# Patient Record
Sex: Male | Born: 1940
Health system: Southern US, Community
[De-identification: ages and names within clinical notes are randomized; demographics above are authoritative.]

## PROBLEM LIST (undated history)

## (undated) DIAGNOSIS — I878 Other specified disorders of veins: Secondary | ICD-10-CM

## (undated) DIAGNOSIS — G4733 Obstructive sleep apnea (adult) (pediatric): Secondary | ICD-10-CM

## (undated) DIAGNOSIS — E785 Hyperlipidemia, unspecified: Secondary | ICD-10-CM

## (undated) DIAGNOSIS — J9610 Chronic respiratory failure, unspecified whether with hypoxia or hypercapnia: Secondary | ICD-10-CM

## (undated) DIAGNOSIS — I10 Essential (primary) hypertension: Secondary | ICD-10-CM

## (undated) DIAGNOSIS — E669 Obesity, unspecified: Secondary | ICD-10-CM

---

## 1990-05-06 DIAGNOSIS — I8000 Phlebitis and thrombophlebitis of superficial vessels of unspecified lower extremity: Secondary | ICD-10-CM | POA: Insufficient documentation

## 1997-08-27 ENCOUNTER — Emergency Department (HOSPITAL_COMMUNITY): Admission: EM | Admit: 1997-08-27 | Discharge: 1997-08-27 | Payer: Self-pay | Admitting: Emergency Medicine

## 1997-10-11 DIAGNOSIS — J45909 Unspecified asthma, uncomplicated: Secondary | ICD-10-CM | POA: Insufficient documentation

## 1998-10-11 ENCOUNTER — Encounter: Payer: Self-pay | Admitting: Emergency Medicine

## 1998-10-11 ENCOUNTER — Emergency Department (HOSPITAL_COMMUNITY): Admission: EM | Admit: 1998-10-11 | Discharge: 1998-10-11 | Payer: Self-pay | Admitting: Emergency Medicine

## 1998-12-03 ENCOUNTER — Encounter: Payer: Self-pay | Admitting: Emergency Medicine

## 1998-12-04 ENCOUNTER — Inpatient Hospital Stay (HOSPITAL_COMMUNITY): Admission: EM | Admit: 1998-12-04 | Discharge: 1998-12-06 | Payer: Self-pay | Admitting: Emergency Medicine

## 1998-12-05 ENCOUNTER — Encounter: Payer: Self-pay | Admitting: Internal Medicine

## 1998-12-06 ENCOUNTER — Encounter: Payer: Self-pay | Admitting: Internal Medicine

## 1999-02-28 ENCOUNTER — Emergency Department (HOSPITAL_COMMUNITY): Admission: EM | Admit: 1999-02-28 | Discharge: 1999-02-28 | Payer: Self-pay | Admitting: Emergency Medicine

## 1999-08-31 ENCOUNTER — Encounter: Payer: Self-pay | Admitting: Family Medicine

## 1999-08-31 ENCOUNTER — Ambulatory Visit (HOSPITAL_COMMUNITY): Admission: RE | Admit: 1999-08-31 | Discharge: 1999-08-31 | Payer: Self-pay | Admitting: Family Medicine

## 1999-10-18 ENCOUNTER — Emergency Department (HOSPITAL_COMMUNITY): Admission: EM | Admit: 1999-10-18 | Discharge: 1999-10-18 | Payer: Self-pay | Admitting: *Deleted

## 2000-04-01 DIAGNOSIS — I10 Essential (primary) hypertension: Secondary | ICD-10-CM | POA: Insufficient documentation

## 2000-09-06 ENCOUNTER — Encounter: Payer: Self-pay | Admitting: Family Medicine

## 2000-09-06 ENCOUNTER — Ambulatory Visit (HOSPITAL_COMMUNITY): Admission: RE | Admit: 2000-09-06 | Discharge: 2000-09-06 | Payer: Self-pay | Admitting: Family Medicine

## 2001-10-04 ENCOUNTER — Emergency Department (HOSPITAL_COMMUNITY): Admission: EM | Admit: 2001-10-04 | Discharge: 2001-10-04 | Payer: Self-pay

## 2001-12-04 ENCOUNTER — Emergency Department (HOSPITAL_COMMUNITY): Admission: EM | Admit: 2001-12-04 | Discharge: 2001-12-05 | Payer: Self-pay | Admitting: Emergency Medicine

## 2001-12-29 ENCOUNTER — Emergency Department (HOSPITAL_COMMUNITY): Admission: EM | Admit: 2001-12-29 | Discharge: 2001-12-29 | Payer: Self-pay | Admitting: Emergency Medicine

## 2001-12-29 ENCOUNTER — Encounter: Payer: Self-pay | Admitting: Emergency Medicine

## 2002-04-16 ENCOUNTER — Emergency Department (HOSPITAL_COMMUNITY): Admission: EM | Admit: 2002-04-16 | Discharge: 2002-04-16 | Payer: Self-pay | Admitting: *Deleted

## 2002-07-18 ENCOUNTER — Emergency Department (HOSPITAL_COMMUNITY): Admission: EM | Admit: 2002-07-18 | Discharge: 2002-07-18 | Payer: Self-pay | Admitting: Emergency Medicine

## 2002-08-12 DIAGNOSIS — E785 Hyperlipidemia, unspecified: Secondary | ICD-10-CM | POA: Insufficient documentation

## 2002-08-12 DIAGNOSIS — J301 Allergic rhinitis due to pollen: Secondary | ICD-10-CM | POA: Insufficient documentation

## 2002-09-10 ENCOUNTER — Emergency Department (HOSPITAL_COMMUNITY): Admission: EM | Admit: 2002-09-10 | Discharge: 2002-09-10 | Payer: Self-pay | Admitting: Emergency Medicine

## 2003-01-04 ENCOUNTER — Encounter: Payer: Self-pay | Admitting: Emergency Medicine

## 2003-01-04 ENCOUNTER — Emergency Department (HOSPITAL_COMMUNITY): Admission: AD | Admit: 2003-01-04 | Discharge: 2003-01-04 | Payer: Self-pay | Admitting: Emergency Medicine

## 2003-04-09 ENCOUNTER — Emergency Department (HOSPITAL_COMMUNITY): Admission: AD | Admit: 2003-04-09 | Discharge: 2003-04-09 | Payer: Self-pay | Admitting: Family Medicine

## 2003-06-09 ENCOUNTER — Emergency Department (HOSPITAL_COMMUNITY): Admission: EM | Admit: 2003-06-09 | Discharge: 2003-06-10 | Payer: Self-pay | Admitting: Emergency Medicine

## 2003-06-28 ENCOUNTER — Emergency Department (HOSPITAL_COMMUNITY): Admission: AD | Admit: 2003-06-28 | Discharge: 2003-06-29 | Payer: Self-pay | Admitting: Emergency Medicine

## 2004-02-05 ENCOUNTER — Emergency Department (HOSPITAL_COMMUNITY): Admission: EM | Admit: 2004-02-05 | Discharge: 2004-02-05 | Payer: Self-pay | Admitting: Emergency Medicine

## 2004-03-23 ENCOUNTER — Ambulatory Visit: Payer: Self-pay | Admitting: Family Medicine

## 2004-03-26 ENCOUNTER — Ambulatory Visit (HOSPITAL_BASED_OUTPATIENT_CLINIC_OR_DEPARTMENT_OTHER): Admission: RE | Admit: 2004-03-26 | Discharge: 2004-03-26 | Payer: Self-pay | Admitting: Family Medicine

## 2004-04-18 ENCOUNTER — Ambulatory Visit: Payer: Self-pay | Admitting: Family Medicine

## 2004-05-11 ENCOUNTER — Emergency Department (HOSPITAL_COMMUNITY): Admission: EM | Admit: 2004-05-11 | Discharge: 2004-05-11 | Payer: Self-pay | Admitting: Emergency Medicine

## 2004-10-26 ENCOUNTER — Ambulatory Visit: Payer: Self-pay | Admitting: Family Medicine

## 2004-12-02 ENCOUNTER — Emergency Department (HOSPITAL_COMMUNITY): Admission: EM | Admit: 2004-12-02 | Discharge: 2004-12-02 | Payer: Self-pay | Admitting: Emergency Medicine

## 2004-12-05 ENCOUNTER — Emergency Department (HOSPITAL_COMMUNITY): Admission: EM | Admit: 2004-12-05 | Discharge: 2004-12-06 | Payer: Self-pay | Admitting: Emergency Medicine

## 2004-12-07 ENCOUNTER — Ambulatory Visit: Payer: Self-pay | Admitting: Family Medicine

## 2004-12-07 DIAGNOSIS — M545 Low back pain, unspecified: Secondary | ICD-10-CM | POA: Insufficient documentation

## 2005-01-08 ENCOUNTER — Ambulatory Visit: Payer: Self-pay | Admitting: Family Medicine

## 2005-01-09 ENCOUNTER — Emergency Department (HOSPITAL_COMMUNITY): Admission: EM | Admit: 2005-01-09 | Discharge: 2005-01-10 | Payer: Self-pay | Admitting: Emergency Medicine

## 2005-02-23 ENCOUNTER — Ambulatory Visit: Payer: Self-pay | Admitting: Pulmonary Disease

## 2005-10-30 ENCOUNTER — Ambulatory Visit: Payer: Self-pay | Admitting: Family Medicine

## 2006-02-05 ENCOUNTER — Ambulatory Visit: Payer: Self-pay | Admitting: Family Medicine

## 2006-02-12 ENCOUNTER — Emergency Department (HOSPITAL_COMMUNITY): Admission: EM | Admit: 2006-02-12 | Discharge: 2006-02-12 | Payer: Self-pay | Admitting: *Deleted

## 2006-04-09 ENCOUNTER — Ambulatory Visit: Payer: Self-pay | Admitting: Family Medicine

## 2007-02-17 ENCOUNTER — Encounter (INDEPENDENT_AMBULATORY_CARE_PROVIDER_SITE_OTHER): Payer: Self-pay | Admitting: *Deleted

## 2007-03-24 ENCOUNTER — Encounter (INDEPENDENT_AMBULATORY_CARE_PROVIDER_SITE_OTHER): Payer: Self-pay | Admitting: Family Medicine

## 2007-03-31 ENCOUNTER — Ambulatory Visit: Payer: Self-pay | Admitting: Family Medicine

## 2007-03-31 LAB — CONVERTED CEMR LAB
ALT: 44 units/L (ref 0–53)
AST: 26 units/L (ref 0–37)
Albumin: 4.2 g/dL (ref 3.5–5.2)
Alkaline Phosphatase: 120 units/L — ABNORMAL HIGH (ref 39–117)
BUN: 13 mg/dL (ref 6–23)
Basophils Absolute: 0.1 10*3/uL (ref 0.0–0.1)
Basophils Relative: 1 % (ref 0–1)
CO2: 25 meq/L (ref 19–32)
Calcium: 8.9 mg/dL (ref 8.4–10.5)
Chloride: 103 meq/L (ref 96–112)
Cholesterol: 171 mg/dL (ref 0–200)
Creatinine, Ser: 1.03 mg/dL (ref 0.40–1.50)
Eosinophils Absolute: 0.1 10*3/uL — ABNORMAL LOW (ref 0.2–0.7)
Eosinophils Relative: 1 % (ref 0–5)
Glucose, Bld: 114 mg/dL — ABNORMAL HIGH (ref 70–99)
HCT: 43.1 % (ref 39.0–52.0)
HDL: 52 mg/dL (ref >39)
Hemoglobin: 14.1 g/dL (ref 13.0–17.0)
LDL Cholesterol: 93 mg/dL (ref 0–99)
Lymphocytes Relative: 20 % (ref 12–46)
Lymphs Abs: 1.4 10*3/uL (ref 0.7–4.0)
MCHC: 32.7 g/dL (ref 30.0–36.0)
MCV: 84.5 fL (ref 78.0–100.0)
Monocytes Absolute: 0.6 10*3/uL (ref 0.1–1.0)
Monocytes Relative: 9 % (ref 3–12)
Neutro Abs: 4.9 10*3/uL (ref 1.7–7.7)
Neutrophils Relative %: 69 % (ref 43–77)
Platelets: 369 10*3/uL (ref 150–400)
Potassium: 4.5 meq/L (ref 3.5–5.3)
RBC: 5.1 M/uL (ref 4.22–5.81)
RDW: 13.8 % (ref 11.5–15.5)
Sodium: 140 meq/L (ref 135–145)
TSH: 3.273 microintl units/mL (ref 0.350–5.50)
Total Bilirubin: 0.6 mg/dL (ref 0.3–1.2)
Total CHOL/HDL Ratio: 3.3
Total Protein: 7.4 g/dL (ref 6.0–8.3)
Triglycerides: 128 mg/dL (ref <150)
VLDL: 26 mg/dL (ref 0–40)
WBC: 7.1 10*3/uL (ref 4.0–10.5)

## 2007-04-04 ENCOUNTER — Telehealth (INDEPENDENT_AMBULATORY_CARE_PROVIDER_SITE_OTHER): Payer: Self-pay | Admitting: Family Medicine

## 2007-04-04 DIAGNOSIS — G4733 Obstructive sleep apnea (adult) (pediatric): Secondary | ICD-10-CM | POA: Insufficient documentation

## 2007-04-23 ENCOUNTER — Telehealth (INDEPENDENT_AMBULATORY_CARE_PROVIDER_SITE_OTHER): Payer: Self-pay | Admitting: Family Medicine

## 2007-05-07 ENCOUNTER — Encounter (INDEPENDENT_AMBULATORY_CARE_PROVIDER_SITE_OTHER): Payer: Self-pay | Admitting: Family Medicine

## 2007-10-06 ENCOUNTER — Telehealth (INDEPENDENT_AMBULATORY_CARE_PROVIDER_SITE_OTHER): Payer: Self-pay | Admitting: Family Medicine

## 2007-10-26 ENCOUNTER — Emergency Department (HOSPITAL_COMMUNITY): Admission: EM | Admit: 2007-10-26 | Discharge: 2007-10-26 | Payer: Self-pay | Admitting: Emergency Medicine

## 2007-12-01 ENCOUNTER — Ambulatory Visit: Payer: Self-pay | Admitting: Family Medicine

## 2007-12-01 DIAGNOSIS — K439 Ventral hernia without obstruction or gangrene: Secondary | ICD-10-CM | POA: Insufficient documentation

## 2007-12-03 ENCOUNTER — Encounter (INDEPENDENT_AMBULATORY_CARE_PROVIDER_SITE_OTHER): Payer: Self-pay | Admitting: Family Medicine

## 2007-12-08 ENCOUNTER — Encounter (INDEPENDENT_AMBULATORY_CARE_PROVIDER_SITE_OTHER): Payer: Self-pay | Admitting: Family Medicine

## 2007-12-10 LAB — CONVERTED CEMR LAB
ALT: 38 units/L (ref 0–53)
AST: 25 units/L (ref 0–37)
Albumin: 4.1 g/dL (ref 3.5–5.2)
Alkaline Phosphatase: 105 units/L (ref 39–117)
BUN: 11 mg/dL (ref 6–23)
Basophils Absolute: 0.1 10*3/uL (ref 0.0–0.1)
Basophils Relative: 1 % (ref 0–1)
CO2: 25 meq/L (ref 19–32)
Calcium: 8.6 mg/dL (ref 8.4–10.5)
Chloride: 101 meq/L (ref 96–112)
Cholesterol: 158 mg/dL (ref 0–200)
Creatinine, Ser: 1.03 mg/dL (ref 0.40–1.50)
Eosinophils Absolute: 0.1 10*3/uL (ref 0.0–0.7)
Eosinophils Relative: 2 % (ref 0–5)
Glucose, Bld: 127 mg/dL — ABNORMAL HIGH (ref 70–99)
HCT: 42.2 % (ref 39.0–52.0)
HDL: 50 mg/dL (ref >39)
Hemoglobin: 14 g/dL (ref 13.0–17.0)
LDL Cholesterol: 85 mg/dL (ref 0–99)
Lymphocytes Relative: 19 % (ref 12–46)
Lymphs Abs: 1.4 10*3/uL (ref 0.7–4.0)
MCHC: 33.2 g/dL (ref 30.0–36.0)
MCV: 85.4 fL (ref 78.0–100.0)
Monocytes Absolute: 0.6 10*3/uL (ref 0.1–1.0)
Monocytes Relative: 8 % (ref 3–12)
Neutro Abs: 5 10*3/uL (ref 1.7–7.7)
Neutrophils Relative %: 69 % (ref 43–77)
Platelets: 329 10*3/uL (ref 150–400)
Potassium: 4.3 meq/L (ref 3.5–5.3)
RBC: 4.94 M/uL (ref 4.22–5.81)
RDW: 14.4 % (ref 11.5–15.5)
Sodium: 138 meq/L (ref 135–145)
TSH: 2.018 microintl units/mL (ref 0.350–4.50)
Total Bilirubin: 0.6 mg/dL (ref 0.3–1.2)
Total CHOL/HDL Ratio: 3.2
Total Protein: 7.1 g/dL (ref 6.0–8.3)
Triglycerides: 114 mg/dL (ref <150)
VLDL: 23 mg/dL (ref 0–40)
WBC: 7.2 10*3/uL (ref 4.0–10.5)

## 2007-12-26 ENCOUNTER — Encounter (INDEPENDENT_AMBULATORY_CARE_PROVIDER_SITE_OTHER): Payer: Self-pay | Admitting: Family Medicine

## 2007-12-26 ENCOUNTER — Telehealth (INDEPENDENT_AMBULATORY_CARE_PROVIDER_SITE_OTHER): Payer: Self-pay | Admitting: *Deleted

## 2007-12-30 ENCOUNTER — Encounter (INDEPENDENT_AMBULATORY_CARE_PROVIDER_SITE_OTHER): Payer: Self-pay | Admitting: *Deleted

## 2008-01-08 ENCOUNTER — Encounter (INDEPENDENT_AMBULATORY_CARE_PROVIDER_SITE_OTHER): Payer: Self-pay | Admitting: Family Medicine

## 2008-06-05 ENCOUNTER — Inpatient Hospital Stay (HOSPITAL_COMMUNITY): Admission: EM | Admit: 2008-06-05 | Discharge: 2008-06-06 | Payer: Self-pay | Admitting: Emergency Medicine

## 2008-06-05 ENCOUNTER — Ambulatory Visit: Payer: Self-pay | Admitting: Cardiology

## 2008-07-08 ENCOUNTER — Ambulatory Visit: Payer: Self-pay | Admitting: Family Medicine

## 2008-09-07 ENCOUNTER — Ambulatory Visit: Payer: Self-pay | Admitting: Family Medicine

## 2008-12-02 ENCOUNTER — Ambulatory Visit: Payer: Self-pay | Admitting: Physician Assistant

## 2008-12-02 DIAGNOSIS — R7309 Other abnormal glucose: Secondary | ICD-10-CM | POA: Insufficient documentation

## 2008-12-03 ENCOUNTER — Encounter: Payer: Self-pay | Admitting: Physician Assistant

## 2008-12-03 LAB — CONVERTED CEMR LAB
ALT: 30 units/L (ref 0–53)
AST: 25 units/L (ref 0–37)
Albumin: 3.7 g/dL (ref 3.5–5.2)
Alkaline Phosphatase: 104 units/L (ref 39–117)
BUN: 15 mg/dL (ref 6–23)
Basophils Absolute: 0.1 10*3/uL (ref 0.0–0.1)
Basophils Relative: 1 % (ref 0–1)
CO2: 22 meq/L (ref 19–32)
Calcium: 8.8 mg/dL (ref 8.4–10.5)
Chloride: 102 meq/L (ref 96–112)
Cholesterol: 148 mg/dL (ref 0–200)
Creatinine, Ser: 0.9 mg/dL (ref 0.40–1.50)
Eosinophils Absolute: 0.1 10*3/uL (ref 0.0–0.7)
Eosinophils Relative: 1 % (ref 0–5)
Glucose, Bld: 112 mg/dL — ABNORMAL HIGH (ref 70–99)
HCT: 40.1 % (ref 39.0–52.0)
HDL: 56 mg/dL (ref >39)
Hemoglobin: 13 g/dL (ref 13.0–17.0)
Hgb A1c MFr Bld: 6 % (ref 4.6–6.1)
LDL Cholesterol: 75 mg/dL (ref 0–99)
Lymphocytes Relative: 20 % (ref 12–46)
Lymphs Abs: 1.6 10*3/uL (ref 0.7–4.0)
MCHC: 32.4 g/dL (ref 30.0–36.0)
MCV: 86.2 fL (ref 78.0–100.0)
Monocytes Absolute: 0.6 10*3/uL (ref 0.1–1.0)
Monocytes Relative: 7 % (ref 3–12)
Neutro Abs: 5.8 10*3/uL (ref 1.7–7.7)
Neutrophils Relative %: 71 % (ref 43–77)
PSA: 0.44 ng/mL (ref 0.10–4.00)
Platelets: 306 10*3/uL (ref 150–400)
Potassium: 4.4 meq/L (ref 3.5–5.3)
RBC: 4.65 M/uL (ref 4.22–5.81)
RDW: 15.5 % (ref 11.5–15.5)
Sodium: 138 meq/L (ref 135–145)
TSH: 3.031 microintl units/mL (ref 0.350–4.500)
Total Bilirubin: 0.5 mg/dL (ref 0.3–1.2)
Total CHOL/HDL Ratio: 2.6
Total Protein: 6.6 g/dL (ref 6.0–8.3)
Triglycerides: 85 mg/dL (ref <150)
VLDL: 17 mg/dL (ref 0–40)
WBC: 8.1 10*3/uL (ref 4.0–10.5)

## 2008-12-16 ENCOUNTER — Encounter (INDEPENDENT_AMBULATORY_CARE_PROVIDER_SITE_OTHER): Payer: Self-pay | Admitting: Internal Medicine

## 2009-02-17 ENCOUNTER — Ambulatory Visit: Payer: Self-pay | Admitting: Physician Assistant

## 2009-02-17 LAB — CONVERTED CEMR LAB
Blood in Urine, dipstick: NEGATIVE
Glucose, Urine, Semiquant: NEGATIVE
Nitrite: NEGATIVE
Protein, U semiquant: NEGATIVE
Specific Gravity, Urine: <1.005
Urobilinogen, UA: 0.2
WBC Urine, dipstick: NEGATIVE
pH: 5

## 2009-02-18 ENCOUNTER — Telehealth: Payer: Self-pay | Admitting: Physician Assistant

## 2009-02-21 ENCOUNTER — Encounter: Payer: Self-pay | Admitting: Physician Assistant

## 2009-02-21 LAB — CONVERTED CEMR LAB
Amphetamine Screen, Ur: NEGATIVE
Barbiturate Quant, Ur: NEGATIVE
Benzodiazepines.: NEGATIVE
Cocaine Metabolites: NEGATIVE
Creatinine,U: 161.4 mg/dL
Marijuana Metabolite: NEGATIVE
Methadone: NEGATIVE
Microalb, Ur: 0.95 mg/dL (ref 0.00–1.89)
Opiate Screen, Urine: NEGATIVE
Phencyclidine (PCP): NEGATIVE
Propoxyphene: NEGATIVE

## 2009-02-28 ENCOUNTER — Encounter (INDEPENDENT_AMBULATORY_CARE_PROVIDER_SITE_OTHER): Payer: Self-pay | Admitting: *Deleted

## 2009-05-06 ENCOUNTER — Encounter: Payer: Self-pay | Admitting: Physician Assistant

## 2009-05-16 ENCOUNTER — Telehealth: Payer: Self-pay | Admitting: Physician Assistant

## 2009-07-15 ENCOUNTER — Telehealth: Payer: Self-pay | Admitting: Physician Assistant

## 2009-07-15 ENCOUNTER — Ambulatory Visit: Payer: Self-pay | Admitting: Physician Assistant

## 2009-07-28 ENCOUNTER — Encounter (INDEPENDENT_AMBULATORY_CARE_PROVIDER_SITE_OTHER): Payer: Self-pay | Admitting: *Deleted

## 2009-07-31 ENCOUNTER — Encounter: Payer: Self-pay | Admitting: Physician Assistant

## 2009-08-01 ENCOUNTER — Encounter: Payer: Self-pay | Admitting: Physician Assistant

## 2009-08-15 ENCOUNTER — Telehealth: Payer: Self-pay | Admitting: Physician Assistant

## 2009-10-04 ENCOUNTER — Telehealth: Payer: Self-pay | Admitting: Physician Assistant

## 2009-10-04 ENCOUNTER — Ambulatory Visit: Payer: Self-pay | Admitting: Physician Assistant

## 2009-10-05 ENCOUNTER — Encounter: Payer: Self-pay | Admitting: Physician Assistant

## 2009-10-05 LAB — CONVERTED CEMR LAB
Amphetamine Screen, Ur: NEGATIVE
Barbiturate Quant, Ur: NEGATIVE
Benzodiazepines.: NEGATIVE
Cocaine Metabolites: NEGATIVE
Creatinine,U: 179.9 mg/dL
Marijuana Metabolite: NEGATIVE
Methadone: NEGATIVE
Opiate Screen, Urine: NEGATIVE
Phencyclidine (PCP): NEGATIVE
Propoxyphene: NEGATIVE

## 2009-10-12 ENCOUNTER — Encounter (INDEPENDENT_AMBULATORY_CARE_PROVIDER_SITE_OTHER): Payer: Self-pay | Admitting: *Deleted

## 2009-10-18 ENCOUNTER — Encounter (INDEPENDENT_AMBULATORY_CARE_PROVIDER_SITE_OTHER): Payer: Self-pay | Admitting: *Deleted

## 2009-11-24 ENCOUNTER — Emergency Department (HOSPITAL_COMMUNITY): Admission: EM | Admit: 2009-11-24 | Discharge: 2009-11-24 | Payer: Self-pay | Admitting: Emergency Medicine

## 2010-01-04 ENCOUNTER — Ambulatory Visit: Payer: Self-pay | Admitting: Physician Assistant

## 2010-01-04 DIAGNOSIS — B360 Pityriasis versicolor: Secondary | ICD-10-CM | POA: Insufficient documentation

## 2010-01-04 DIAGNOSIS — J069 Acute upper respiratory infection, unspecified: Secondary | ICD-10-CM | POA: Insufficient documentation

## 2010-01-04 DIAGNOSIS — B353 Tinea pedis: Secondary | ICD-10-CM | POA: Insufficient documentation

## 2010-01-04 LAB — CONVERTED CEMR LAB
Bilirubin Urine: NEGATIVE
Blood in Urine, dipstick: NEGATIVE
Glucose, Urine, Semiquant: NEGATIVE
Ketones, urine, test strip: NEGATIVE
Nitrite: NEGATIVE
Protein, U semiquant: NEGATIVE
Specific Gravity, Urine: 1.015
Urobilinogen, UA: 0.2
WBC Urine, dipstick: NEGATIVE
pH: 5

## 2010-01-05 LAB — CONVERTED CEMR LAB
ALT: 43 units/L (ref 0–53)
AST: 36 units/L (ref 0–37)
Albumin: 3.8 g/dL (ref 3.5–5.2)
Alkaline Phosphatase: 94 units/L (ref 39–117)
Amphetamine Screen, Ur: NEGATIVE
BUN: 13 mg/dL (ref 6–23)
Barbiturate Quant, Ur: NEGATIVE
Basophils Absolute: 0 10*3/uL (ref 0.0–0.1)
Basophils Relative: 0 % (ref 0–1)
Benzodiazepines.: NEGATIVE
CO2: 24 meq/L (ref 19–32)
Calcium: 9 mg/dL (ref 8.4–10.5)
Chloride: 105 meq/L (ref 96–112)
Cocaine Metabolites: NEGATIVE
Creatinine, Ser: 0.9 mg/dL (ref 0.40–1.50)
Creatinine,U: 113.6 mg/dL
Eosinophils Absolute: 0.1 10*3/uL (ref 0.0–0.7)
Eosinophils Relative: 1 % (ref 0–5)
Glucose, Bld: 124 mg/dL — ABNORMAL HIGH (ref 70–99)
HCT: 40.2 % (ref 39.0–52.0)
Hemoglobin: 13.2 g/dL (ref 13.0–17.0)
Hgb A1c MFr Bld: 6.3 % — ABNORMAL HIGH (ref <5.7)
Lymphocytes Relative: 13 % (ref 12–46)
Lymphs Abs: 0.9 10*3/uL (ref 0.7–4.0)
MCHC: 32.8 g/dL (ref 30.0–36.0)
MCV: 86.3 fL (ref 78.0–100.0)
Marijuana Metabolite: NEGATIVE
Methadone: NEGATIVE
Monocytes Absolute: 0.5 10*3/uL (ref 0.1–1.0)
Monocytes Relative: 8 % (ref 3–12)
Neutro Abs: 5.4 10*3/uL (ref 1.7–7.7)
Neutrophils Relative %: 77 % (ref 43–77)
Opiate Screen, Urine: NEGATIVE
Phencyclidine (PCP): NEGATIVE
Platelets: 281 10*3/uL (ref 150–400)
Potassium: 4.5 meq/L (ref 3.5–5.3)
Propoxyphene: NEGATIVE
RBC: 4.66 M/uL (ref 4.22–5.81)
RDW: 14.8 % (ref 11.5–15.5)
Sodium: 140 meq/L (ref 135–145)
TSH: 2.306 microintl units/mL (ref 0.350–4.500)
Total Bilirubin: 0.6 mg/dL (ref 0.3–1.2)
Total Protein: 6.6 g/dL (ref 6.0–8.3)
WBC: 7 10*3/uL (ref 4.0–10.5)

## 2010-01-24 ENCOUNTER — Encounter: Payer: Self-pay | Admitting: Physician Assistant

## 2010-04-13 ENCOUNTER — Encounter (INDEPENDENT_AMBULATORY_CARE_PROVIDER_SITE_OTHER): Payer: Self-pay | Admitting: Nurse Practitioner

## 2010-04-13 ENCOUNTER — Ambulatory Visit: Payer: Self-pay | Admitting: Nurse Practitioner

## 2010-04-13 LAB — CONVERTED CEMR LAB
Blood Glucose, Fingerstick: 116
Cholesterol, target level: 200 mg/dL
Cholesterol: 176 mg/dL (ref 0–200)
HDL goal, serum: 40 mg/dL
HDL: 64 mg/dL (ref >39)
LDL Cholesterol: 95 mg/dL (ref 0–99)
LDL Goal: 130 mg/dL
Total CHOL/HDL Ratio: 2.8
Triglycerides: 85 mg/dL (ref <150)
VLDL: 17 mg/dL (ref 0–40)

## 2010-04-14 ENCOUNTER — Encounter (INDEPENDENT_AMBULATORY_CARE_PROVIDER_SITE_OTHER): Payer: Self-pay | Admitting: Nurse Practitioner

## 2010-05-17 ENCOUNTER — Encounter (INDEPENDENT_AMBULATORY_CARE_PROVIDER_SITE_OTHER): Payer: Self-pay | Admitting: Internal Medicine

## 2010-05-21 LAB — CONVERTED CEMR LAB
ALT: 38 units/L (ref 0–53)
AST: 27 units/L (ref 0–37)
Albumin: 4.2 g/dL (ref 3.5–5.2)
Alkaline Phosphatase: 108 units/L (ref 39–117)
BUN: 13 mg/dL (ref 6–23)
CO2: 27 meq/L (ref 19–32)
Calcium: 9.6 mg/dL (ref 8.4–10.5)
Chloride: 99 meq/L (ref 96–112)
Creatinine, Ser: 1.1 mg/dL (ref 0.40–1.50)
Glucose, Bld: 144 mg/dL — ABNORMAL HIGH (ref 70–99)
Hgb A1c MFr Bld: 6.4 % — ABNORMAL HIGH (ref 4.6–6.1)
Potassium: 4.7 meq/L (ref 3.5–5.3)
Sodium: 141 meq/L (ref 135–145)
Total Bilirubin: 0.7 mg/dL (ref 0.3–1.2)
Total Protein: 7.5 g/dL (ref 6.0–8.3)

## 2010-05-25 NOTE — Medication Information (Signed)
Summary: MEDICARE PRESCRIPTION DRUG PROGRAM  MEDICARE PRESCRIPTION DRUG PROGRAM   Imported By: Roland Earl 07/11/2009 10:50:58  _____________________________________________________________________  External Attachment:    Type:   Image     Comment:   External Document

## 2010-05-25 NOTE — Progress Notes (Signed)
  Phone Note Outgoing Call   Call placed by: Milagros Evener MD,  April 04, 2007 12:26 PM Summary of Call: L/M on business #(home # dc'd). He may come in on MON am if prefers or just call.  #1 Did he get his oxycontin? I have not had a pre-auth request... #2 He needs a fasting CBG if his bloodwork from prior visit was not fasting...otherwise he will need a HgAic at f/u.Marland KitchenMarland Kitchenpossible early DM... #3 Was there another medicine he needed me to refill ? VR  Follow-up for Phone Call        Home # disconnected. Work # has no answer. Will just wait until pt f/u to check Hgaic. Follow-up by: Milagros Evener MD,  April 07, 2007 6:03 PM

## 2010-05-25 NOTE — Letter (Signed)
Summary: *[facility] Results Follow up  [place], [place] [address]   Phone: [phone]  Fax: [phone]      02/21/2009   Brian Silva [address], [place]  [address]   Dear  [name],                            ____S.Drinkard,FNP   ____D. Gore,FNP       ____B. McPherson,MD   ____V. Rankins,MD    ____E. Mulberry,MD    ____N. Hassell Done, FNP  ____D. Jobe Igo, MD    ____K. Tomma Lightning, MD    __x__S. Kathlen Mody, PA-C     This letter is to inform you that your recent test(s):  _______Pap Smear    ___x____Lab Test     _______X-ray    ___x____ is within acceptable limits  _______ requires a medication change  _______ requires a follow-up lab visit  _______ requires a follow-up visit with your provider   Comments:       _________________________________________________________ If you have any questions, please contact our office                     Sincerely,  Brian Dopp PA-C [facility]

## 2010-05-25 NOTE — Letter (Signed)
Summary: AUTHORIZATION/ DO NOT USE  AUTHORIZATION/ DO NOT USE   Imported By: Roland Earl 12/03/2007 16:36:20  _____________________________________________________________________  External Attachment:    Type:   Image     Comment:   External Document  Appended Document: AUTHORIZATION/ DO NOT USE MD letter to Mr Lotti.

## 2010-05-25 NOTE — Letter (Signed)
Summary: Letter from DR [facility] TO PATIENT  Letter from DR [facility] TO PATIENT   Imported By: Roland Earl 03/24/2007 14:20:37  _____________________________________________________________________  External Attachment:    Type:   Image     Comment:   External Document

## 2010-05-25 NOTE — Medication Information (Signed)
Summary: [facility]   Imported By: Roland Earl 01/07/2008 17:01:36  _____________________________________________________________________  External Attachment:    Type:   Image     Comment:   External Document

## 2010-05-25 NOTE — Progress Notes (Signed)
  Phone Note Other Incoming   Summary of Call: Please ask patient if there is any way he can take a baby Aspirin (81 mg ) a day. If so, go ahead and start.   This is for prevention of heart attacks. He had calcification of his heart arteries in the past; so if he can take it, it would be very beneficial.  Initial call taken by: Versie Starks,  February 18, 2009 8:39 AM  Follow-up for Phone Call        Left message on answering machine for pt to call back.Marland KitchenMarland KitchenMarland KitchenThailand Shannon  February 18, 2009 10:19 AM   Left message on answering machine for pt to call back...Marland KitchenMarland KitchenThailand Shannon  February 25, 2009 4:55 PM   Left message on answering machine for pt to call back...Marland KitchenMarland KitchenMarland Kitchen will mail letter......Marland KitchenThailand Shannon  February 28, 2009 10:53 AM

## 2010-05-25 NOTE — Medication Information (Signed)
Summary: RX Folder/[facility]  RX Folder/[facility]   Imported By: Roland Earl 01/08/2008 09:37:18  _____________________________________________________________________  External Attachment:    Type:   Image     Comment:   External Document

## 2010-05-25 NOTE — Assessment & Plan Note (Signed)
Summary: CPE//mm   Vital Signs:  Patient profile:   70 year old male Height:      70.25 inches Weight:      416 pounds BMI:     59.48 Temp:     98.9 degrees F oral Pulse rate:   96 / minute Pulse rhythm:   regular Resp:     18 per minute BP sitting:   158 / 80  (left arm) Cuff size:   large  Vitals Entered By: CMA Student Phineas Inches CC: CPP, patient has a cough with white mucas, Hypertension Management Is Patient Diabetic? No Pain Assessment Patient in pain? no       Does patient need assistance? Functional Status Self care Ambulation Normal   Primary Care [facility]:  Richardson Dopp PA-C  CC:  CPP, patient has a cough with white mucas, and Hypertension Management.  History of Present Illness: Here for CPE. Complaining of cough x 3 days.  Sputum white.  No hemoptysis.  No chest pain.  No dyspnea.  Does have nasal congestion.  No PND or orthopnea.  No fevers.  No chills.  NO vomiting or diarrhea.  Using proventil more during day.  Wheezing.  PHQ9=5.   Hypertension History:      taking over the counter cold med.        Positive major cardiovascular risk factors include male age 44 years old or older, hyperlipidemia, and hypertension.  Negative major cardiovascular risk factors include negative family history for ischemic heart disease and non-tobacco-user status.    Problems Prior to Update: 1)  Tinea Versicolor  (ICD-111.0) 2)  Tinea Pedis  (ICD-110.4) 3)  Uri  (ICD-465.9) 4)  Preventive Health Care  (ICD-V70.0) 5)  Hyperglycemia  (ICD-790.29) 6)  Abdominal Wall Hernia  (ICD-553.20) 7)  Obesity, Morbid  (ICD-278.01) 8)  Sleep Apnea, Obstructive  (ICD-327.23) 9)  Superficial Phlebitis  (ICD-451.0) 10)  Allergic Rhinitis, Seasonal  (ICD-477.0) 11)  Low Back Pain, Chronic  (ICD-724.2) 12)  Hyperlipidemia  (ICD-272.4) 13)  Asthma  (ICD-493.90) 14)  Hypertension  (ICD-401.9)  Allergies (verified): No Known Drug Allergies  Past History:  Past Medical  History: Last updated: 12/02/2008 Hypertension (04/01/2000) Asthma (10/11/1997) Hyperlipidemia (08/12/2002) Venous Statis Disease (03/28/2004) Sleep Apnea..2005...study very difficult for him as he is awake routinely at night,clausterphobic,with suspected social anxiety.He has not kept multiple f/u appts for CPAP titration.Hecontinues to decline treatment 08/2008. Chest Pain - Admit [facility] 05/2008 (r/o for MI; no further w/u pursued; saw cardiology; rec. further eval if pt. has recurrent symptoms; chest CT demonstarted coronary art. disease/calcification)  Past Surgical History: Last updated: 08/31/2006 Sleep Study (03/26/2004) Open MRI-Lumbar  (11/27/2004)  Family History: no prostate cancer  Social History: Reviewed history from 09/07/2008 and no changes required. Lives alone. drives no phone.  Review of Systems      See HPI General:  See HPI. CV:  Denies chest pain or discomfort and shortness of breath with exertion. Resp:  Denies coughing up blood. GI:  Denies bloody stools and dark tarry stools. GU:  Denies dysuria. Derm:  Complains of rash. Psych:  See HPI. Heme:  Denies bleeding.  Physical Exam  General:  alert, well-developed, and well-nourished.   Head:  normocephalic and atraumatic.   Eyes:  pupils equal, pupils round, and pupils reactive to light.   Ears:  R ear normal and L ear normal.   Nose:  no external deformity.   Mouth:  pharynx pink and moist and no exudates.   Neck:  supple,  no thyromegaly, and no cervical lymphadenopathy.   Lungs:  faint exp wheezes bilat bases no rales  Heart:  normal rate and regular rhythm.   Abdomen:  soft and non-tender.   Rectal:  normal sphincter tone and external hemorrhoid(s).   Genitalia:  testicles small . .  .likely related to excessive adipose tissue no discharge  Prostate:  no gland enlargement and no nodules.  difficult exam due to size Msk:  antalgic gait Extremities:  1+ left pedal edema and 1+ right pedal edema.    Neurologic:  alert & oriented X3 and cranial nerves II-XII intact.   Skin:  + tinea versicolor on left arm  Psych:  normally interactive and good eye contact.     Foot/Ankle Exam  Skin:    tinea bilat feet   Impression & Recommendations:  Problem # 1:  PREVENTIVE HEALTH CARE (ICD-V70.0)  discussed PSA and colonoscopy he declines both of these at this time get stool cards declines HIV test  Orders: T-Hemoccult Cards-Multiple [id number]) T-TSH [id number])  Problem # 2:  HYPERTENSION (ICD-401.9)  only taking furosemide and clonidine check lytes and renal fxn elevated BP may be related to recent illness  The following medications were removed from the medication list:    Avapro 150 Mg Tabs (Irbesartan) ..... One by mouth q am His updated medication list for this problem includes:    Lasix 80 Mg Tabs (Furosemide) ..... One by mouth two times a day    Clonidine Hcl 0.1 Mg Tabs (Clonidine hcl) ..... One tab by mouth two times a day  Orders: T-Comprehensive Metabolic Panel (A999333) T-CBC w/Diff [id number]) T-TSH [id number]) T-Urinalysis [id number])  Problem # 3:  HYPERGLYCEMIA (ICD-790.29)  never got GTT repeat A1C  Orders: T- Hemoglobin A1C [id number])  Problem # 4:  SLEEP APNEA, OBSTRUCTIVE (ICD-327.23) noncompliant  Problem # 5:  ALLERGIC RHINITIS, SEASONAL (ICD-477.0) change allegra to zyrtec  Problem # 6:  LOW BACK PAIN, CHRONIC (ICD-724.2) check UDS neg for oxycontin last time says he last took on Tuesday (yest)  His updated medication list for this problem includes:    Nabumetone 500 Mg Tabs (Nabumetone) ..... One by mouth q 12 hours as needed pain    Oxycontin 10 Mg Tb12 (Oxycodone hcl) .Marland Kitchen... Take 1 tablet by mouth every 12 hours for pain  Orders: T-Drug Screen-Urine, (single) [phone])  Problem # 7:  HYPERLIPIDEMIA (ICD-272.4)  check lft's and arrange FLP  His updated medication list for this problem includes:    Lipitor 20  Mg Tabs (Atorvastatin calcium) ..... One once daily  Orders: T-Comprehensive Metabolic Panel (A999333)  Problem # 8:  ASTHMA (ICD-493.90) worse with recent URI  His updated medication list for this problem includes:    Ventolin Hfa 108 (90 Base) Mcg/act Aers (Albuterol sulfate) .Marland Kitchen... 1-2 puffs every 4-6 hours as needed for shortness of breath    Albuterol Sulfate (2.5 Mg/37ml) 0.083% Nebu (Albuterol sulfate) ..... Use  one ampule per nebulizer every 6-8 hours as needed sob    Advair Diskus 250-50 Mcg/dose Misc (Fluticasone-salmeterol) .Marland Kitchen... 1 puff 2 times daily    Advair Diskus 500-50 Mcg/dose Aepb (Fluticasone-salmeterol) .Marland Kitchen... 1 inhalation two times a day for one month.  then, resume 250/50 micrograms dose.  Problem # 9:  URI (ICD-465.9) zyrtec increase advair dose for 1-2 weeks fluids, tylenol, rest f/u as needed   His updated medication list for this problem includes:    Nabumetone 500 Mg Tabs (Nabumetone) ..... One by mouth q 12 hours as needed pain  Zyrtec Allergy 10 Mg Tabs (Cetirizine hcl) .Marland Kitchen... Take 1 tablet by mouth once a day as needed for allergies  Problem # 10:  TINEA PEDIS (ICD-110.4)  lamisil cream   His updated medication list for this problem includes:    Lamisil At 1 % Crea (Terbinafine hcl) .Marland Kitchen... Apply to feet two times a day for 2-3 weeks.  continue 1 week after clears  Complete Medication List: 1)  Lasix 80 Mg Tabs (Furosemide) .... One by mouth two times a day 2)  Lipitor 20 Mg Tabs (Atorvastatin calcium) .... One once daily 3)  Ventolin Hfa 108 (90 Base) Mcg/act Aers (Albuterol sulfate) .Marland Kitchen.. 1-2 puffs every 4-6 hours as needed for shortness of breath 4)  Clonidine Hcl 0.1 Mg Tabs (Clonidine hcl) .... One tab by mouth two times a day 5)  Nabumetone 500 Mg Tabs (Nabumetone) .... One by mouth q 12 hours as needed pain 6)  Oxycontin 10 Mg Tb12 (Oxycodone hcl) .... Take 1 tablet by mouth every 12 hours for pain 7)  Albuterol Sulfate (2.5 Mg/33ml) 0.083%  Nebu (Albuterol sulfate) .... Use  one ampule per nebulizer every 6-8 hours as needed sob 8)  Nebulizer Misc (Nebulizers) .... One nebulizer with supplies for home use.diagnosis is asthma 9)  Advair Diskus 250-50 Mcg/dose Misc (Fluticasone-salmeterol) .Marland Kitchen.. 1 puff 2 times daily 10)  Zyrtec Allergy 10 Mg Tabs (Cetirizine hcl) .... Take 1 tablet by mouth once a day as needed for allergies 11)  Flonase 50 Mcg/act Susp (Fluticasone propionate) .... 2 sprays each nostril once daily (use for 3 weeks, then take one week off) use as needed for allergies 12)  Lamisil At 1 % Crea (Terbinafine hcl) .... Apply to feet two times a day for 2-3 weeks.  continue 1 week after clears 13)  Selenium Sulfide 2.5 % Lotn (Selenium sulfide) .... Apply neck to knees once daily for 7 days; then once a month for 3 mos.  wash off after 5-10 minutes (allow to dry) 14)  Advair Diskus 500-50 Mcg/dose Aepb (Fluticasone-salmeterol) .Marland Kitchen.. 1 inhalation two times a day for one month.  then, resume 250/50 micrograms dose.  Hypertension Assessment/Plan:      The patient's hypertensive risk group is category B: At least one risk factor (excluding diabetes) with no target organ damage.  His calculated 10 year risk of coronary heart disease is 11 %.  Today's blood pressure is 158/80.  His blood pressure goal is < 140/90.   Patient Instructions: 1)  Schedule flu shot in 4-6 weeks. 2)  Schedule fasting lipids. 3)  Use zyrtec once daily to help with drainage. 4)  Use your flonase. 5)  Use higher dose of Advair for one month.  I sent a prescription to your pharmacy.  Then, go back to your old dose. 6)  Use your nebulizer every 4-6 hours for 2 days.  Then, use as needed. 7)  Continue to use Ventolin as needed. 8)  Drink plenty of fluids.  Get rest.  Take tylenol for fever or chills. 9)  Follow up with Scott if cold symptoms worsen or do not get better. 10)  Schedule follow up in 2 months for blood pressure. Prescriptions: OXYCONTIN 10 MG   TB12 (OXYCODONE HCL) Take 1 tablet by mouth every 12 hours for pain  #60 x 0   Entered and Authorized by:   Richardson Dopp PA-C   Signed by:   Richardson Dopp PA-C on 01/04/2010   Method used:   Print then Give to Patient  [id number] ADVAIR DISKUS 500-50 MCG/DOSE AEPB (FLUTICASONE-SALMETEROL) 1 inhalation two times a day for one month.  Then, resume 250/50 micrograms dose.  #1 x 0   Entered and Authorized by:   Richardson Dopp PA-C   Signed by:   Richardson Dopp PA-C on 01/04/2010   Method used:   Electronically to        [facility]. [phone]* (retail)       [address]       [place], [place]  [address]       Ph: [phone]       Fax: [phone]   RxIDBR:[id number] SELENIUM SULFIDE 2.5 % LOTN (SELENIUM SULFIDE) apply neck to knees once daily for 7 days; then once a month for 3 mos.  Wash off after 5-10 minutes (allow to dry)  #1 bottle x 2   Entered and Authorized by:   Richardson Dopp PA-C   Signed by:   Richardson Dopp PA-C on 01/04/2010   Method used:   Electronically to        [facility]. [phone]* (retail)       [address]       [place], [place]  [address]       Ph: [phone]       Fax: [phone]   RxID:   [id number] LAMISIL AT 1 % CREA (TERBINAFINE HCL) Apply to feet two times a day for 2-3 weeks.  Continue 1 week after clears  #45 grams x 1   Entered and Authorized by:   Richardson Dopp PA-C   Signed by:   Richardson Dopp PA-C on 01/04/2010   Method used:   Electronically to        [facility]. [phone]* (retail)       [address].       [place], [place]  [address]       Ph: [phone]       Fax: [phone]   RxID:   [id number] ZYRTEC ALLERGY 10 MG TABS (CETIRIZINE HCL) Take 1 tablet by mouth once a day as needed for allergies  #30 x 5   Entered and Authorized by:   Richardson Dopp PA-C   Signed by:   Richardson Dopp PA-C on 01/04/2010    Method used:   Electronically to        [facility]. [phone]* (retail)       [address].       [place], [place]  [address]       Ph: [phone]       Fax: [phone]   [id number]   Laboratory Results   Urine Tests    Routine Urinalysis   Glucose: negative   (Normal Range: Negative) Bilirubin: negative   (Normal Range: Negative) Ketone: negative   (Normal Range: Negative) Spec. Gravity: 1.015   (Normal Range: 1.003-1.035) Blood: negative   (Normal Range: Negative) pH: 5.0   (Normal Range: 5.0-8.0) Protein: negative   (Normal Range: Negative) Urobilinogen: 0.2   (Normal Range: 0-1) Nitrite: negative   (Normal Range: Negative) Leukocyte Esterace: negative   (Normal Range: Negative)

## 2010-05-25 NOTE — Assessment & Plan Note (Signed)
Summary: PER NURSE/ RENEW MEDICATIONS//GK   Vital Signs:  Patient Profile:   70 Years Old Male Height:     70.25 inches Weight:      350 pounds BMI:     50.04 BSA:     2.66 Temp:     97.1 degrees F Pulse rate:   98 / minute Pulse rhythm:   regular Resp:     20 per minute BP sitting:   134 / 80  (left arm) Cuff size:   large  Pt. in pain?   yes    Location:   back    Intensity:   5    Type:       dull  Vitals Entered By: Isla Pence (March 31, 2007 3:34 PM) Weight > 350 lbs. Yes              Is Patient Diabetic? No  Does patient need assistance? Ambulation Normal     Chief Complaint:  back pain , flu shot, and Rx refill.  History of Present Illness: Here for routine f/u.Needs refills and reports having more back and leg pain. #1 Has h/o LBP and radiculopathy since 2006. Had open MRI(limited by patient's body habitus) IN 8/06.     MRI showed L3-L4 DDD and moderate L3 foraminal impingement. Pt was referred to Neurosurgeon      chose not to f/u with several scheduled appts.see 10/30/05 note.He has been using relafen and      darvocet for pain but reports that this is no longer helping with pain.     He denies bowel or bladder incontinence. No muscle weakness in lower extremities or falls. Mainly,     he notes pain with prolonged periods of standing or sitting. He occasionally notes nubness and      tingling in his bilateral anterior thighs. Resolves spontaneously. #2 Pt has know sleep apnea and has declined CPAP(failed to keep scheduled appt in past and does not      wish to be re-referred. MD has d/w him issues and medical problems if not treated for his sleep       apnea. #3 He reports that he is taking his meds and needs refills.  #4 At end of visit, Pt says he is worried about his memory as he will occasionally forget what he is      doing. He has started to keep a daily calendar.  Current Allergies: No known allergies   Past Medical History:  Reviewed history from 08/31/2006 and no changes required:       Hypertension (04/01/2000)       Asthma (10/11/1997)       Hyperlipidemia (08/12/2002)       Venous Statis Disease (03/28/2004)  Past Surgical History:    Reviewed history from 08/31/2006 and no changes required:       Sleep Study (03/26/2004)       Open MRI-Lumbar  (11/27/2004)    Risk Factors:  Tobacco use:  never Caffeine use:  3 drinks per day Alcohol use:  yes    Type:  beer Seatbelt use:  100 %  Family History Risk Factors:    Family History of MI in females < 91 years old:  no    Family History of MI in males < 34 years old:  no   Review of Systems      See HPI   Physical Exam  General:     Morbid obesity.marginal hygiene. Talks in nl speech and  no signs of respiratory distress. Lungs:     Normal respiratory effort, chest expands symmetrically. Lungs are clear to auscultation, no crackles or wheezes. Heart:     Normal rate and regular rhythm. S1 and S2 normal without gallop, murmur, click, rub or other extra sounds. Extremities:     No appreciable CCE. Neurologic:     alert & oriented X3 and cranial nerves II-XII intact grossly. Memory screen:  3 word recall is excellent with 3/3 on immediate recall,3/3 at 1 min, and 3/3 at 5 min.   He is able to draw a clock and mark it correctly for "ten after eleven".   His animal recall only just normal at 12 and the screening test that was the most difficult for him.      Impression & Recommendations:  Problem # 1:  LOW BACK PAIN, CHRONIC (ICD-724.2) Options d/w patient and include referral to neurosurgeon,referral to chronic pain Center for possible epidural injections and management, or medication adjustment with use of Chronic Pain Contract/frequent monitoring/ and agreement to f/u in Pain Clinic if pain not controlled with oxycontin. Pt opts to try oxycontin and close f/u at clinic. He is sent home with a chronic pain contract to review and sign. He is  aware that he must keep appt. with MD in 4 weeks for recheck and new prescription(MD will place 1/09 script on chart).  The following medications were removed from the medication list:    Darvocet-n 100 Tabs (Propoxyphene n-apap tabs) ..... One q 8 hours as needed pain  His updated medication list for this problem includes:    Nabumetone 500 Mg Tabs (Nabumetone) ..... One by mouth q 12 hours as needed pain    Oxycontin 10 Mg Tb12 (Oxycodone hcl) .Marland Kitchen... Take 1 tablet by mouth every 12 hours for pain   Problem # 2:  SLEEP APNEA, OBSTRUCTIVE (ICD-327.23) MD again recommended referral for CPAP titration and pt declines.  Problem # 3:  HYPERLIPIDEMIA (B2193296.4)  His updated medication list for this problem includes:    Lipitor 20 Mg Tabs (Atorvastatin calcium) ..... One once daily  Orders: T-General Health Panel (CBCD, CMP, TSH) ([id number]) T-Lipid Profile [id number])   Problem # 4:  HYPERTENSION (ICD-401.9)  His updated medication list for this problem includes:    Lasix 80 Mg Tabs (Furosemide) ..... One by mouth two times a day    Clonidine Hcl 0.1 Mg Tabs (Clonidine hcl) ..... One tab by mouth two times a day    Avapro 150 Mg Tabs (Irbesartan) ..... One by mouth q am  Orders: T-General Health Panel (CBCD, CMP, TSH) ([id number]) T-Lipid Profile [id number])   Complete Medication List: 1)  Lasix 80 Mg Tabs (Furosemide) .... One by mouth two times a day 2)  Lipitor 20 Mg Tabs (Atorvastatin calcium) .... One once daily 3)  Klor-con M20 20 Meq Tbcr (Potassium chloride crys cr) .... One once daily 4)  Allegra-d 12 Hour 60-120 Mg Tb12 (Fexofenadine-pseudoephedrine) .... One tab q 12 hours as needed allergies 5)  Albuterol 90 Mcg/act Aers (Albuterol) .... Two puffs q 4-6 hours as needed for shortness of breath 6)  Clonidine Hcl 0.1 Mg Tabs (Clonidine hcl) .... One tab by mouth two times a day 7)  Nabumetone 500 Mg Tabs (Nabumetone) .... One by mouth q 12 hours as needed pain 8)   Avapro 150 Mg Tabs (Irbesartan) .... One by mouth q am 9)  Oxycontin 10 Mg Tb12 (Oxycodone hcl) .... Take 1 tablet by mouth every 12 hours  for pain  Other Orders: Flu Vaccine 52yrs + [id number]) Admin 1st Vaccine [id number])   Patient Instructions: 1)  Please schedule a follow-up appointment in 1 month. 2)  Pt told to contact office if prior-authorization is needed for the oxycontin.He is also to notify his pharmacy that he will be filling the oxycontin on a monthly basis and that the pharmacy will need to orderfor stocking purposes.    Prescriptions: OXYCONTIN 10 MG  TB12 (OXYCODONE HCL) Take 1 tablet by mouth every 12 hours for pain  #60 x 0   Entered and Authorized by:   Milagros Evener MD   Signed by:   Milagros Evener MD on 03/31/2007   Method used:   Print then Give to Patient   RxID:   [id number] ALBUTEROL 90 MCG/ACT AERS (ALBUTEROL) two puffs q 4-6 hours as needed for shortness of breath  #1 x 5   Entered and Authorized by:   Milagros Evener MD   Signed by:   Milagros Evener MD on 03/31/2007   Method used:   Print then Give to Patient   RxID:   [id number] LIPITOR 20 MG TABS (ATORVASTATIN CALCIUM) one once daily  #30 x 5   Entered and Authorized by:   Milagros Evener MD   Signed by:   Milagros Evener MD on 03/31/2007   Method used:   Print then Give to Patient   RxID:   [id number] LASIX 80 MG TABS (FUROSEMIDE) one by mouth two times a day  #60 x 5   Entered and Authorized by:   Milagros Evener MD   Signed by:   Milagros Evener MD on 03/31/2007   Method used:   Print then Give to Patient   RxID:   [id number] NABUMETONE 500 MG TABS (NABUMETONE) one by mouth q 12 hours as needed pain  #60 x 5   Entered and Authorized by:   Milagros Evener MD   Signed by:   Milagros Evener MD on 03/31/2007   Method used:   Print then Give to Patient   RxID:   [id number] CLONIDINE HCL 0.1 MG TABS (CLONIDINE HCL) one tab by mouth two times a day  #60 x 5    Entered and Authorized by:   Milagros Evener MD   Signed by:   Milagros Evener MD on 03/31/2007   Method used:   Print then Give to Patient   RxID:   [id number] ALLEGRA-D 12 HOUR 60-120 MG TB12 (FEXOFENADINE-PSEUDOEPHEDRINE) one tab q 12 hours as needed allergies  #30 x 5   Entered and Authorized by:   Milagros Evener MD   Signed by:   Milagros Evener MD on 03/31/2007   Method used:   Print then Give to Patient   [id number]  ]  Influenza Vaccine    Vaccine Type: Fluvax 3+    Site: left deltoid    Mfr: Marty    Dose: 0.5 ml    Route: IM    Given by: Isla Pence    Exp. Date: 10/21/2007    Lot #: [id number]    VIS given: 11/14/06 version given March 31, 2007.  Flu Vaccine Consent Questions    Do you have a history of severe allergic reactions to this vaccine? no    Any prior history of allergic reactions to egg and/or gelatin? no    Do you have a sensitivity to the preservative Thimersol? no    Do you have a past history of Guillan-Barre Syndrome? no  Do you currently have an acute febrile illness? no    Have you ever had a severe reaction to latex? no    Vaccine information given and explained to patient? yes

## 2010-05-25 NOTE — Progress Notes (Signed)
Summary: Medications  Phone Note Call from Patient   Summary of Call: The pt wants to inform the ptrovider that he is taking the following medications: fluticasone, oxycotin, flurosemide, nabumetone, and potassium Kathlen Mody PA-c  Initial call taken by: Alexis Goodell,  October 04, 2009 3:18 PM  Follow-up for Phone Call        forward to provider Follow-up by: [place] Shannon,  October 04, 2009 3:48 PM  Additional Follow-up for Phone Call Additional follow up Details #1::        Ok.  Is he taking?: Lipitor Ventolin as needed Clonidine (which he had with him at [facility]) [place] Additional Follow-up by: Richardson Dopp PA-C,  October 06, 2009 9:30 AM    Additional Follow-up for Phone Call Additional follow up Details #2::    Left message on answer machine for pt. to return call.  Bridgett Larsson RN  October 07, 2009 12:10 PM  Left message on voicemail to return call.  Sherian Maroon RN  October 11, 2009 4:12 PM  Left message on voicemail to return call.   Sherian Maroon RN  October 12, 2009 10:16 AM  Left message on answering machine for pt to call back.... will mail letter... [place] Shannon  October 12, 2009 4:00 PM

## 2010-05-25 NOTE — Letter (Signed)
Summary: Lipid Letter  [facility]  [address] [place], [place] [address]   Phone: [phone]  Fax: [phone]    04/14/2010  Brian Silva, [place]  [address]  Dear Brian Silva:  We have carefully reviewed your last lipid profile from 04/13/2010 and the results are noted below with a summary of recommendations for lipid management.    Cholesterol:       176     Goal: <200   HDL "good" Cholesterol:   64     Goal: greater than 40   LDL "bad" Cholesterol:   95     Goal: less than 100   Triglycerides:       85     Goal: less than 150    Your cholesterol labs are good.      Current Medications: 1)    Lasix 80 Mg Tabs (Furosemide) .... One by mouth two times a day 2)    Lipitor 20 Mg Tabs (Atorvastatin calcium) .... One once daily 3)    Ventolin Hfa 108 (90 Base) Mcg/act Aers (Albuterol sulfate) .Marland Kitchen.. 1-2 puffs every 4-6 hours as needed for shortness of breath 4)    Clonidine Hcl 0.1 Mg Tabs (Clonidine hcl) .... One tab by mouth two times a day 5)    Nabumetone 500 Mg Tabs (Nabumetone) .... One by mouth q 12 hours as needed pain 6)    Oxycontin 10 Mg  Tb12 (Oxycodone hcl) .... Take 1 tablet by mouth every 12 hours for pain 7)    Albuterol Sulfate (2.5 Mg/85ml) 0.083%  Nebu (Albuterol sulfate) .... Use  one ampule per nebulizer every 6-8 hours as needed sob 8)    Nebulizer   Misc (Nebulizers) .... One nebulizer with supplies for home use.diagnosis is asthma 9)    Zyrtec Allergy 10 Mg Tabs (Cetirizine hcl) .... Take 1 tablet by mouth once a day as needed for allergies 10)    Flonase 50 Mcg/act Susp (Fluticasone propionate) .... 2 sprays each nostril once daily (use for 3 weeks, then take one week off) use as needed for allergies 11)    Lamisil At 1 % Crea (Terbinafine hcl) .... Apply to feet two times a day for 2-3 weeks.  continue 1 week after clears 12)    Selenium Sulfide 2.5 % Lotn (Selenium sulfide) .... Apply neck  to knees once daily for 7 days; then once a month for 3 mos.  wash off after 5-10 minutes (allow to dry) 13)    Qvar 80 Mcg/act Aers (Beclomethasone dipropionate) .... 2 puffs inhalation two times a day 14)    Loratadine 10 Mg Tabs (Loratadine) .... One tablet by mouth daily for allergies  If you have any questions, please call. We appreciate being able to work with you.   Sincerely,    Aurora Mask, FNP [facility]

## 2010-05-25 NOTE — Letter (Signed)
Summary: Generic Letter  [facility]  [address] [place], [place] [address]   Phone: [phone]  Fax: [phone]    03/24/2007  [facility] [address] [place], [place]  [address]  Dear Mr. CHIONG,           Sincerely,   Milagros Evener MD [facility]

## 2010-05-25 NOTE — Letter (Signed)
Summary: [facility]MED[facility]  [facility]   Imported By: Roland Earl 01/30/2010 09:17:17  _____________________________________________________________________  External Attachment:    Type:   Image     Comment:   External Document

## 2010-05-25 NOTE — Letter (Signed)
Summary: CONTROLLED SUBSTANCE /SIGNED  CONTROLLED SUBSTANCE /SIGNED   Imported ByRoland Earl 12/08/2008 12:57:17  _____________________________________________________________________  External Attachment:    Type:   Image     Comment:   External Document

## 2010-05-25 NOTE — Progress Notes (Signed)
Summary: Needs to see MD  Phone Note Outgoing Call   Summary of Call: Receptionist to call pt. MD did refills to be faxed.Pt due for 6 month OV to see MD and continue to get oxycontin refills Initial call taken by: Milagros Evener MD,  October 06, 2007 2:11 PM  Follow-up for Phone Call        HOME PHONE DISCONNECTED Follow-up by: Roberto Scales,  October 07, 2007 8:41 AM  Additional Follow-up for Phone Call Additional follow up Details #1::        home phone disconnected. [name], his neighbor says to take him out as a contact for Brian Silva. Additional Follow-up by: Roberto Scales,  October 08, 2007 12:20 PM

## 2010-05-25 NOTE — Progress Notes (Signed)
Summary: REFILL ON INHALER  Phone Note Call from Patient Call back at [facility] Phone [phone]   Summary of Call: The pt states that ne needs refills from ventollin or xopenex. ([facility] off from [place]) Kathlen Mody PA-c  Initial call taken by: Alexis Goodell,  August 15, 2009 2:25 PM  Follow-up for Phone Call        MR Starkman, CALLED TO SEE IF ONE OF HIS INHALER CAN BE CALLED INTO [facility]. Follow-up by: Roberto Scales,  August 15, 2009 4:19 PM  Additional Follow-up for Phone Call Additional follow up Details #1::        forward to provider Additional Follow-up by: [place] Shannon,  August 15, 2009 4:38 PM    Additional Follow-up for Phone Call Additional follow up Details #2::    pt is aware Follow-up by: [place] Shannon,  August 15, 2009 5:47 PM  Prescriptions: VENTOLIN HFA 108 (90 BASE) MCG/ACT AERS (ALBUTEROL SULFATE) 1-2 puffs every 4-6 hours as needed for shortness of breath  #1 x 11   Entered and Authorized by:   Richardson Dopp PA-C   Signed by:   Richardson Dopp PA-C on 08/15/2009   Method used:   Electronically to        [facility]. [phone]* (retail)       [address]       [place], [place]  [address]       Ph: [phone]       Fax: [phone]   RxID:   [id number]

## 2010-05-25 NOTE — Assessment & Plan Note (Signed)
Summary: flu shot and renew meds////kt   Vital Signs:  Patient profile:   70 year old male Height:      70.25 inches Weight:      400 pounds BMI:     57.19 Temp:     97.4 degrees F oral Pulse rate:   86 / minute Pulse rhythm:   regular Resp:     18 per minute BP sitting:   183 / 68  (left arm) Cuff size:   large  Vitals Entered By: [place] Shannon (February 17, 2009 8:55 AM) CC: pt is here to refill on oxy. med....., Hypertension Management Is Patient Diabetic? No Pain Assessment Patient in pain? no       Does patient need assistance? Functional Status Self care Ambulation Normal   Primary Care Jameika Kinn:  Richardson Dopp PA-C  CC:  pt is here to refill on oxy. med..... and Hypertension Management.  History of Present Illness: Here for f/u to get his narcotic meds refilled. Doing well.  Back pain is stable without change. He continues to try to lose weight and has lost another 14 pounds since last visit! I congratulated him on this. He is trying to walk, but gives out sometimes with his back pain.  Asthma symptoms seem to be stable.  No increase wheezing or cough.  Not limiting activity.   Asthma History    Initial Asthma Severity Rating:    Age range: 12+ years    Symptoms: >2 days/week; not daily    Nighttime Awakenings: 3-4/month    Interferes w/ normal activity: no limitations    SABA use (not for EIB): >2 days/week but not >1X/day    Asthma Severity Assessment: Mild Persistent  Hypertension History:      He denies headache, chest pain, palpitations, dyspnea with exertion, orthopnea, PND, and syncope.  Further comments include: NHYA class 2.        Positive major cardiovascular risk factors include male age 55 years old or older, hyperlipidemia, and hypertension.  Negative major cardiovascular risk factors include negative family history for ischemic heart disease and non-tobacco-user status.       Problems Prior to Update: 1)  Preventive Health Care   (ICD-V70.0) 2)  Hyperglycemia  (ICD-790.29) 3)  Abdominal Wall Hernia  (ICD-553.20) 4)  Obesity, Morbid  (ICD-278.01) 5)  Sleep Apnea, Obstructive  (ICD-327.23) 6)  Superficial Phlebitis  (ICD-451.0) 7)  Allergic Rhinitis, Seasonal  (ICD-477.0) 8)  Low Back Pain, Chronic  (ICD-724.2) 9)  Hyperlipidemia  (ICD-272.4) 10)  Asthma  (ICD-493.90) 11)  Hypertension  (ICD-401.9)  Current Medications (verified): 1)  Lasix 80 Mg Tabs (Furosemide) .... One By Mouth Two Times A Day 2)  Lipitor 20 Mg Tabs (Atorvastatin Calcium) .... One Once Daily 3)  Klor-Con M20 20 Meq Tbcr (Potassium Chloride Crys Cr) .... One Once Daily 4)  Allegra-D 12 Hour 60-120 Mg Tb12 (Fexofenadine-Pseudoephedrine) .... One Tab Q 12 Hours As Needed Allergies 5)  Albuterol 90 Mcg/act Aers (Albuterol) .... Two Puffs Q 4-6 Hours As Needed For Shortness of Breath 6)  Clonidine Hcl 0.1 Mg Tabs (Clonidine Hcl) .... One Tab By Mouth Two Times A Day 7)  Nabumetone 500 Mg Tabs (Nabumetone) .... One By Mouth Q 12 Hours As Needed Pain 8)  Avapro 150 Mg Tabs (Irbesartan) .... One By Mouth Q Am 9)  Oxycontin 10 Mg  Tb12 (Oxycodone Hcl) .... Take 1 Tablet By Mouth Every 12 Hours For Pain 10)  Albuterol Sulfate (2.5 Mg/23ml) 0.083%  Nebu (Albuterol  Sulfate) .... Use  One Ampule Per Nebulizer Every 6-8 Hours As Needed Sob 11)  Nebulizer   Misc (Nebulizers) .... One Nebulizer With Supplies For Home Use.diagnosis Is Asthma 12)  Advair Diskus 250-50 Mcg/dose Misc (Fluticasone-Salmeterol) .Marland Kitchen.. 1 Puff 2 Times Daily  Allergies (verified): No Known Drug Allergies  Past History:  Past Medical History: Last updated: 12/02/2008 Hypertension (04/01/2000) Asthma (10/11/1997) Hyperlipidemia (08/12/2002) Venous Statis Disease (03/28/2004) Sleep Apnea..2005...study very difficult for him as he is awake routinely at night,clausterphobic,with suspected social anxiety.He has not kept multiple f/u appts for CPAP titration.Hecontinues to decline  treatment 08/2008. Chest Pain - Admit [facility] 05/2008 (r/o for MI; no further w/u pursued; saw cardiology; rec. further eval if pt. has recurrent symptoms; chest CT demonstarted coronary art. disease/calcification)  Physical Exam  General:  alert, well-developed, and well-nourished.   Head:  normocephalic and atraumatic.   Lungs:  normal breath sounds, no crackles, and no wheezes.   Heart:  normal rate and regular rhythm.   Abdomen:  soft and non-tender.   Extremities:  1+ left pedal edema and 1+ right pedal edema.   Neurologic:  alert & oriented X3 and cranial nerves II-XII intact.   Psych:  normally interactive.     Impression & Recommendations:  Problem # 1:  HYPERTENSION (ICD-401.9) previously optimal did not sleep last night recheck 120/82 per CMA f/u with bp check in 2 weeks  His updated medication list for this problem includes:    Lasix 80 Mg Tabs (Furosemide) ..... One by mouth two times a day    Clonidine Hcl 0.1 Mg Tabs (Clonidine hcl) ..... One tab by mouth two times a day    Avapro 150 Mg Tabs (Irbesartan) ..... One by mouth q am  Orders: T-Urine Microalbumin w/creat. ratio [phone])  Problem # 2:  LOW BACK PAIN, CHRONIC (ICD-724.2) stable check UDS refill oxycontin  His updated medication list for this problem includes:    Nabumetone 500 Mg Tabs (Nabumetone) ..... One by mouth q 12 hours as needed pain    Oxycontin 10 Mg Tb12 (Oxycodone hcl) .Marland Kitchen... Take 1 tablet by mouth every 12 hours for pain  Orders: T-Drug Screen-Urine, (single) [phone])  Problem # 3:  ASTHMA (ICD-493.90) symptomatically stable continue meds  His updated medication list for this problem includes:    Albuterol 90 Mcg/act Aers (Albuterol) .Marland Kitchen..Marland Kitchen Two puffs q 4-6 hours as needed for shortness of breath    Albuterol Sulfate (2.5 Mg/50ml) 0.083% Nebu (Albuterol sulfate) ..... Use  one ampule per nebulizer every 6-8 hours as needed sob    Advair Diskus 250-50 Mcg/dose Misc  (Fluticasone-salmeterol) .Marland Kitchen... 1 puff 2 times daily  Problem # 4:  [place] (ICD-V70.0) still refuses colo. has not done stool cards - asked him to do so o/w up to date on health maint.  Complete Medication List: 1)  Lasix 80 Mg Tabs (Furosemide) .... One by mouth two times a day 2)  Lipitor 20 Mg Tabs (Atorvastatin calcium) .... One once daily 3)  Klor-con M20 20 Meq Tbcr (Potassium chloride crys cr) .... One once daily 4)  Allegra-d 12 Hour 60-120 Mg Tb12 (Fexofenadine-pseudoephedrine) .... One tab q 12 hours as needed allergies 5)  Albuterol 90 Mcg/act Aers (Albuterol) .... Two puffs q 4-6 hours as needed for shortness of breath 6)  Clonidine Hcl 0.1 Mg Tabs (Clonidine hcl) .... One tab by mouth two times a day 7)  Nabumetone 500 Mg Tabs (Nabumetone) .... One by mouth q 12 hours as needed  pain 8)  Avapro 150 Mg Tabs (Irbesartan) .... One by mouth q am 9)  Oxycontin 10 Mg Tb12 (Oxycodone hcl) .... Take 1 tablet by mouth every 12 hours for pain 10)  Albuterol Sulfate (2.5 Mg/76ml) 0.083% Nebu (Albuterol sulfate) .... Use  one ampule per nebulizer every 6-8 hours as needed sob 11)  Nebulizer Misc (Nebulizers) .... One nebulizer with supplies for home use.diagnosis is asthma 12)  Advair Diskus 250-50 Mcg/dose Misc (Fluticasone-salmeterol) .Marland Kitchen.. 1 puff 2 times daily  Other Orders: Flu Vaccine 86yrs + [id number]) Admin 1st Vaccine [phone]) Admin 1st Vaccine [facility]) [phone])  Hypertension Assessment/Plan:      The patient's hypertensive risk group is category B: At least one risk factor (excluding diabetes) with no target organ damage.  His calculated 10 year risk of coronary heart disease is 14 %.  Today's blood pressure is 183/68.    Patient Instructions: 1)  Please schedule a follow-up appointment in 3 months with Scott for blood pressure and back pain. 2)  Return in 2 weeks for blood pressure check with the nurse. 3)  Turn in your stool cards. 4)  Call me if you change your  mind about the colonoscopy. 5)    Prescriptions: OXYCONTIN 10 MG  TB12 (OXYCODONE HCL) Take 1 tablet by mouth every 12 hours for pain  #60 x 0   Entered and Authorized by:   Richardson Dopp PA-C   Signed by:   Richardson Dopp PA-C on 02/17/2009   Method used:   Print then Give to Patient   RxID:   [id number]    Influenza Vaccine    Vaccine Type: Fluvax 3+    Site: left deltoid    Mfr: [place]    Dose: 0.5 ml    Route: IM    Given by: [place] Shannon    Lot #[id number]    VIS given: 11/14/06 version given February 17, 2009.  Flu Vaccine Consent Questions    Do you have a history of severe allergic reactions to this vaccine? no    Any prior history of allergic reactions to egg and/or gelatin? no    Do you have a sensitivity to the preservative Thimersol? no    Do you have a past history of Guillan-Barre Syndrome? no    Do you currently have an acute febrile illness? no    Have you ever had a severe reaction to latex? no    Vaccine information given and explained to patient? yes   Laboratory Results   Urine Tests  Date/Time Received: February 17, 2009 12:02 PM   Routine Urinalysis   Glucose: negative   (Normal Range: Negative) Bilirubin: small   (Normal Range: Negative) Ketone: trace (5)   (Normal Range: Negative) Spec. Gravity: <1.005   (Normal Range: 1.003-1.035) Blood: negative   (Normal Range: Negative) pH: 5.0   (Normal Range: 5.0-8.0) Protein: negative   (Normal Range: Negative) Urobilinogen: 0.2   (Normal Range: 0-1) Nitrite: negative   (Normal Range: Negative) Leukocyte Esterace: negative   (Normal Range: Negative)       Appended Document: flu shot and renew meds////kt   Vital Signs:  Patient Profile:   70 Years Old Male Height:     70.25 inches Pulse rate:   72 / minute BP sitting:   120 / 64  (right arm) Cuff size:   regular Weight > 350 lbs. Yes

## 2010-05-25 NOTE — Progress Notes (Signed)
Summary: MEDICINE NEEDS TO BE CHANGED   Phone Note Call from Patient Call back at [facility] Phone [phone]   Summary of Call: Brian Silva STOPPED BY THIS MORNING AND LEFT HIS LETTER FROM MEDICARE, STATING THAT HIS FEXOFINADINE IS NO LONGER COVERED AND ON THE LETTER IT STATES WHICH 2 MEDS. CAN BE SUBSITUTED. HE WANTS [name] TO CALL IT INTO [name].  Initial call taken by: Roberto Scales,  May 16, 2009 10:23 AM  Follow-up for Phone Call        forward to provider to review (paperwork in office) Follow-up by: Vinetta Bergamo CMA,  May 16, 2009 11:57 AM  Additional Follow-up for Phone Call Additional follow up Details #1::        The note I have references ProAir. His ProAir will be changed to Ventolin [name] Additional Follow-up by: Versie Starks,  May 16, 2009 5:10 PM    New/Updated Medications: VENTOLIN HFA 108 (90 BASE) MCG/ACT AERS (ALBUTEROL SULFATE) 1-2 puffs every 4-6 hours as needed for shortness of breath Prescriptions: VENTOLIN HFA 108 (90 BASE) MCG/ACT AERS (ALBUTEROL SULFATE) 1-2 puffs every 4-6 hours as needed for shortness of breath  #1 x 5   Entered and Authorized by:   Richardson Dopp PA-C   Signed by:   Richardson Dopp PA-C on 05/16/2009   Method used:   Electronically to        [facility]. [phone]* (retail)       [address]       [place], [place]  [address]       Ph: [phone]       Fax: [phone]   RxID:   [id number]

## 2010-05-25 NOTE — Assessment & Plan Note (Signed)
Summary: Back pain/Asthma   Vital Signs:  Patient profile:   70 year old male Weight:      411.8 pounds BMI:     58.88 O2 Sat:      95 % on Room air Temp:     97.8 degrees F oral Pulse rate:   88 / minute Pulse rhythm:   regular Resp:     20 per minute BP sitting:   120 / 84  (left arm) Cuff size:   large  Vitals Entered By: Isla Pence (April 13, 2010 3:29 PM)  Nutrition Counseling: Patient's BMI is greater than 25 and therefore counseled on weight management options.  O2 Flow:  Room air  Serial Vital Signs/Assessments:  Comments: 4:32 PM peak flow 370, 370, 330 By: Aurora Mask FNP   CC: back pain...needs medication refills, Hypertension Management, Lipid Management, Back Pain Is Patient Diabetic? No Pain Assessment Patient in pain? yes     Location: back CBG Result 116 CBG Device ID B had a [employer]   Does patient need assistance? Functional Status Self care Ambulation Normal   Primary Care Provider:  Richardson Dopp PA-C  CC:  back pain...needs medication refills, Hypertension Management, Lipid Management, and Back Pain.  History of Present Illness:  Pt into the office for medication refills and for inhaler  Obesity - pt has lost 67 pounds in the past and his highest weight was 67 pounds. He has a goal to to get back on his Atkins and decrease weight. Down 5 pounds since last visit  Back Pain History:      The patient's back pain has been present for > 6 weeks.        Description of injury in patient's own words:  Takes oxycontin for pain. Last dx 12/2009. He uses very infrequent.  Takes if he is going to be out and shopping for ambulation for extended periods of time.    Critical Exclusionary Diagnosis Criteria (CEDC) for Back Pain:      There are no symptoms to suggest infection, cauda equina, or psychosocial factors for back pain.  Cancer risk factors include no improvement in low back pain after 4-6 weeks therapy.  Other positive CEDC factors  include low back pain worse with lumbar extension (downhill walking-standing-reaching overhead).    Hypertension History:      He denies headache, chest pain, and palpitations.  He notes no problems with any antihypertensive medication side effects.        Positive major cardiovascular risk factors include male age 25 years old or older, hyperlipidemia, and hypertension.  Negative major cardiovascular risk factors include negative family history for ischemic heart disease and non-tobacco-user status.        Further assessment for target organ damage reveals no history of ASHD, cardiac end-organ damage (CHF/LVH), stroke/TIA, peripheral vascular disease, renal insufficiency, or hypertensive retinopathy.    Lipid Management History:      Positive NCEP/ATP III risk factors include male age 35 years old or older and hypertension.  Negative NCEP/ATP III risk factors include no family history for ischemic heart disease, non-tobacco-user status, no ASHD (atherosclerotic heart disease), no prior stroke/TIA, and no peripheral vascular disease.      Habits & Providers  Alcohol-Tobacco-Diet     Alcohol type: beer     Tobacco Status: never  Exercise-Depression-Behavior     Seat Belt Use: 100  Allergies: No Known Drug Allergies  Review of Systems General:  Denies fever. ENT:  Complains of sinus pressure. CV:  Denies chest pain or discomfort. Resp:  Complains of cough and shortness of breath. MS:  Complains of low back pain.  Physical Exam  General:  alert.  obese Head:  normocephalic.   Lungs:  expiratory wheezes Heart:  normal rate and regular rhythm.   Abdomen:  normal bowel sounds.   Msk:  up to the exam table Neurologic:  alert & oriented X3 and gait normal.   Skin:  color normal.   Psych:  Oriented X3.     Impression & Recommendations:  Problem # 1:  HYPERTENSION (ICD-401.9) BP stable His updated medication list for this problem includes:    Lasix 80 Mg Tabs (Furosemide) .....  One by mouth two times a day    Clonidine Hcl 0.1 Mg Tabs (Clonidine hcl) ..... One tab by mouth two times a day  Problem # 2:  ASTHMA (ICD-493.90) will start qvar pt to use ventolin as needed  The following medications were removed from the medication list:    Advair Diskus 250-50 Mcg/dose Misc (Fluticasone-salmeterol) .Marland Kitchen... 1 puff 2 times daily    Advair Diskus 500-50 Mcg/dose Aepb (Fluticasone-salmeterol) .Marland Kitchen... 1 inhalation two times a day for one month.  then, resume 250/50 micrograms dose. His updated medication list for this problem includes:    Ventolin Hfa 108 (90 Base) Mcg/act Aers (Albuterol sulfate) .Marland Kitchen... 1-2 puffs every 4-6 hours as needed for shortness of breath    Albuterol Sulfate (2.5 Mg/36ml) 0.083% Nebu (Albuterol sulfate) ..... Use  one ampule per nebulizer every 6-8 hours as needed sob    Qvar 80 Mcg/act Aers (Beclomethasone dipropionate) .Marland Kitchen... 2 puffs inhalation two times a day  Orders: Peak Flow Rate (94150) Pulse Oximetry (single measurment) (94760)  Problem # 3:  HYPERLIPIDEMIA (ICD-272.4) will check lipids today pt is fasting His updated medication list for this problem includes:    Lipitor 20 Mg Tabs (Atorvastatin calcium) ..... One once daily  Problem # 4:  LOW BACK PAIN, CHRONIC (ICD-724.2)  His updated medication list for this problem includes:    Nabumetone 500 Mg Tabs (Nabumetone) ..... One by mouth q 12 hours as needed pain    Oxycontin 10 Mg Tb12 (Oxycodone hcl) .Marland Kitchen... Take 1 tablet by mouth every 12 hours for pain  Complete Medication List: 1)  Lasix 80 Mg Tabs (Furosemide) .... One by mouth two times a day 2)  Lipitor 20 Mg Tabs (Atorvastatin calcium) .... One once daily 3)  Ventolin Hfa 108 (90 Base) Mcg/act Aers (Albuterol sulfate) .Marland Kitchen.. 1-2 puffs every 4-6 hours as needed for shortness of breath 4)  Clonidine Hcl 0.1 Mg Tabs (Clonidine hcl) .... One tab by mouth two times a day 5)  Nabumetone 500 Mg Tabs (Nabumetone) .... One by mouth q 12 hours  as needed pain 6)  Oxycontin 10 Mg Tb12 (Oxycodone hcl) .... Take 1 tablet by mouth every 12 hours for pain 7)  Albuterol Sulfate (2.5 Mg/17ml) 0.083% Nebu (Albuterol sulfate) .... Use  one ampule per nebulizer every 6-8 hours as needed sob 8)  Nebulizer Misc (Nebulizers) .... One nebulizer with supplies for home use.diagnosis is asthma 9)  Zyrtec Allergy 10 Mg Tabs (Cetirizine hcl) .... Take 1 tablet by mouth once a day as needed for allergies 10)  Flonase 50 Mcg/act Susp (Fluticasone propionate) .... 2 sprays each nostril once daily (use for 3 weeks, then take one week off) use as needed for allergies 11)  Lamisil At 1 % Crea (Terbinafine hcl) .... Apply to feet two times a day  for 2-3 weeks.  continue 1 week after clears 12)  Selenium Sulfide 2.5 % Lotn (Selenium sulfide) .... Apply neck to knees once daily for 7 days; then once a month for 3 mos.  wash off after 5-10 minutes (allow to dry) 13)  Qvar 80 Mcg/act Aers (Beclomethasone dipropionate) .... 2 puffs inhalation two times a day 14)  Loratadine 10 Mg Tabs (Loratadine) .... One tablet by mouth daily for allergies  Other Orders: T-Lipid Profile [id number]) Capillary Blood Glucose/CBG [id number])  Hypertension Assessment/Plan:      The patient's hypertensive risk group is category B: At least one risk factor (excluding diabetes) with no target organ damage.  His calculated 10 year risk of coronary heart disease is 7 %.  Today's blood pressure is 120/84.  His blood pressure goal is < 140/90.  Lipid Assessment/Plan:      Based on NCEP/ATP III, the patient's risk factor category is "2 or more risk factors and a calculated 10 year CAD risk of < 20%".  The patient's lipid goals are as follows: Total cholesterol goal is 200; LDL cholesterol goal is 130; HDL cholesterol goal is 40; Triglyceride goal is 150.     Patient Instructions: 1)  Asthma - You should use the Qvar twice daily - EVERY DAY (rinse mouth after use) 2)  Use the inhaler (ventolin)  just when needed 3)  Back Pain - ongoing. 4)  May take pain med as needed 5)  Reduction in weight will reduce back pain 6)  Your cholesterol will be checked today since you have not eaten 7)  Follow up as needed Prescriptions: QVAR 80 MCG/ACT AERS (BECLOMETHASONE DIPROPIONATE) 2 puffs inhalation two times a day  #1 month qs x 5   Entered and Authorized by:   Aurora Mask FNP   Signed by:   Aurora Mask FNP on 04/13/2010   Method used:   Print then Give to Patient   RxID:   [id number] OXYCONTIN 10 MG  TB12 (OXYCODONE HCL) Take 1 tablet by mouth every 12 hours for pain  #60 x 0   Entered and Authorized by:   Aurora Mask FNP   Signed by:   Aurora Mask FNP on 04/13/2010   Method used:   Print then Give to Patient   RxID:   [id number] LORATADINE 10 MG TABS (LORATADINE) One tablet by mouth daily for allergies  #30 x 5   Entered and Authorized by:   Aurora Mask FNP   Signed by:   Aurora Mask FNP on 04/13/2010   Method used:   Print then Give to Patient   RxID:   [id number]    Orders Added: 1)  Est. Patient Level III [id number] 2)  Peak Flow Rate [94150] 3)  Pulse Oximetry (single measurment) [94760] 4)  T-Lipid Profile [80061-22930] 5)  Capillary Blood Glucose/CBG [82948]    Laboratory Results   Blood Tests   Date/Time Received: April 13, 2010   CBG Random:: 116mg /dL

## 2010-05-25 NOTE — Letter (Signed)
Summary: Generic Letter  [facility]  [address] [place], [place] [address]   Phone: [phone]  Fax: [phone]    02/17/2005  BREYLAN YENSER [id number] [place], [place]  [address]  Dear Mr. STEINBERGER,  We have been unable to reach you by phone. Please contact our office to schedule an appointment. No further refills will be approved until you see the doctor.         Sincerely,   Bridgett Larsson RN [facility]

## 2010-05-25 NOTE — Letter (Signed)
Summary: Generic Letter  [facility]  [address] [place], [place] [address]   Phone: [phone]  Fax: [phone]    12/30/2005  Brian Silva [id number] [place], [place]  [address]   Dear Mr. SASO,  This letter is to inform you that medicare prescription plan would not approve you to get Avapro. Dr Radene Ou reviewed your chart and allergies and at this time is going to switch you to Lisinopril 20mg  take one tab daily. If you have any questions or concerns to this change please give our office a call at [phone] and ask to speak to Dr Radene Ou' nurse.    Sincerely,     Vinetta Bergamo CMA [facility]

## 2010-05-25 NOTE — Miscellaneous (Signed)
Summary: Review of patient's prescription fill hx.  Clinical Lists Changes  Problems: Assessed HYPERTENSION as comment only -  review of patients prescription hx from his pharmacy indicated he is taking all meds except: furosemide potassium Avapro He has not filled these medicines recently. Will d/w patient at f/u and review his BP  His updated medication list for this problem includes:    Lasix 80 Mg Tabs (Furosemide) ..... One by mouth two times a day    Clonidine Hcl 0.1 Mg Tabs (Clonidine hcl) ..... One tab by mouth two times a day    Avapro 150 Mg Tabs (Irbesartan) ..... One by mouth q am        Impression & Recommendations:  Problem # 1:  HYPERTENSION (ICD-401.9)  review of patients prescription hx from his pharmacy indicated he is taking all meds except: furosemide potassium Avapro He has not filled these medicines recently. Will d/w patient at f/u and review his BP  His updated medication list for this problem includes:    Lasix 80 Mg Tabs (Furosemide) ..... One by mouth two times a day    Clonidine Hcl 0.1 Mg Tabs (Clonidine hcl) ..... One tab by mouth two times a day    Avapro 150 Mg Tabs (Irbesartan) ..... One by mouth q am  Complete Medication List: 1)  Lasix 80 Mg Tabs (Furosemide) .... One by mouth two times a day 2)  Lipitor 20 Mg Tabs (Atorvastatin calcium) .... One once daily 3)  Klor-con M20 20 Meq Tbcr (Potassium chloride crys cr) .... One once daily 4)  Ventolin Hfa 108 (90 Base) Mcg/act Aers (Albuterol sulfate) .Marland Kitchen.. 1-2 puffs every 4-6 hours as needed for shortness of breath 5)  Clonidine Hcl 0.1 Mg Tabs (Clonidine hcl) .... One tab by mouth two times a day 6)  Nabumetone 500 Mg Tabs (Nabumetone) .... One by mouth q 12 hours as needed pain 7)  Avapro 150 Mg Tabs (Irbesartan) .... One by mouth q am 8)  Oxycontin 10 Mg Tb12 (Oxycodone hcl) .... Take 1 tablet by mouth every 12 hours for pain 9)  Albuterol Sulfate (2.5 Mg/80ml) 0.083% Nebu (Albuterol  sulfate) .... Use  one ampule per nebulizer every 6-8 hours as needed sob 10)  Nebulizer Misc (Nebulizers) .... One nebulizer with supplies for home use.diagnosis is asthma 11)  Advair Diskus 250-50 Mcg/dose Misc (Fluticasone-salmeterol) .Marland Kitchen.. 1 puff 2 times daily 12)  Allegra 180 Mg Tabs (Fexofenadine hcl) .... Take 1 tablet by mouth once a day as needed for allergies 13)  Flonase 50 Mcg/act Susp (Fluticasone propionate) .... 2 sprays each nostril once daily (use for 3 weeks, then take one week off) use as needed for allergies

## 2010-05-25 NOTE — Progress Notes (Signed)
Summary: Office Visit//DEPRESSION SCREENING  Office Visit//DEPRESSION SCREENING   Imported By: Roland Earl 01/09/2010 16:54:06  _____________________________________________________________________  External Attachment:    Type:   Image     Comment:   External Document

## 2010-05-25 NOTE — Assessment & Plan Note (Signed)
Summary: STOMACH PROBLEMS  /  NS   Vital Signs:  Patient Profile:   70 Years Old Male Height:     70.25 inches Temp:     97.2 degrees F Pulse rate:   88 / minute Pulse rhythm:   regular Resp:     20 per minute BP sitting:   136 / 82  (left arm) Cuff size:   large  Pt. in pain?   no  Vitals Entered By: [place] (December 01, 2007 4:29 PM) Weight > 350 lbs. Yes                  Chief Complaint:  stomach has been hurting not feeling nauseated went to ED last month and they took some xrays and not hurting right now..  History of Present Illness: He for f/u for med refills. Has a tendency to be seen only when refills needed(as in no refills until seen). Wants 2 albuterol MDI's per month and we discussed that he needs baseline PFTs and maintenance inhalers. He should not require so much albuterol if his asthma is controlled. Some of his SOB could be associated with his morbid obesity. He denies CP,dizziness,palpitations.  Was seen in ER several weeks ago for nausea which has resolved. Has known abdominal wall hernia.    Updated Prior Medication List: Pt states he needs two albuterol's a month or he will need to go to the ED also request pain med refills Current Allergies: No known allergies   Past Medical History:    Reviewed history from 08/31/2006 and no changes required:       Hypertension (04/01/2000)       Asthma (10/11/1997)       Hyperlipidemia (08/12/2002)       Venous Statis Disease (03/28/2004)  Past Surgical History:    Reviewed history from 08/31/2006 and no changes required:       Sleep Study (03/26/2004)       Open MRI-Lumbar  (11/27/2004)     Review of Systems  The patient denies anorexia, fever, chest pain, syncope, prolonged cough, melena, and hematochezia.         Pt trying to lose weight  CV      Complains of swelling of feet.      Denies chest pain or discomfort, difficulty breathing at night, difficulty breathing while lying down,  fainting, leg cramps with exertion, and palpitations.  Resp      Complains of wheezing.      Denies chest discomfort, chest pain with inspiration, cough, and morning headaches.      Pt feels like wheezing    Physical Exam  General:     Morbidly obese but hygiene is good at today's visit and feet are without lesions and only trace LE pitting edema. Lungs:     Normal respiratory effort, chest expands symmetrically. Lungs are clear to auscultation, no crackles or wheezes. Heart:     Normal rate and regular rhythm. S1 and S2 normal without gallop, murmur, click, rub or other extra sounds. Abdomen:     Morbid obesity. Has large ventral wall hernia;reducible.soft, non-tender, normal bowel sounds, no masses, no guarding, no rigidity, and no rebound tenderness.   Extremities:     trace left pedal edema and trace right pedal edema.      Impression & Recommendations:  Problem # 1:  ASTHMA (ICD-493.90) Pt agrees to baseline PFTs. MD d/w him that he has a pattern of not keeping his referral appts AND  that he is difficult to reach as he has no phone. PT agrees to keep appt for PFTs and these will be scheduled for sometime next month. He is to leave the phone # of a friend/neighbor for emergency contact purposes. He is to contact our office in 2 weeks if he has not received the PFTs appt. date and time(this ensures that he is responsible for f/u on this appt). Use Advair routinely as this may decrease your need for the albuterol MDI. Will prescribe nebulizer as he may do better with administration of albuterol via neb(less coordination required that MDI).  Problem # 2:  HYPERTENSION (ICD-401.9)  His updated medication list for this problem includes:    Lasix 80 Mg Tabs (Furosemide) ..... One by mouth two times a day    Clonidine Hcl 0.1 Mg Tabs (Clonidine hcl) ..... One tab by mouth two times a day    Avapro 150 Mg Tabs (Irbesartan) ..... One by mouth q am  Orders: T-General Health Panel  (CBCD, CMP, TSH) ([id number])   Problem # 3:  HYPERLIPIDEMIA (P102836.4)  His updated medication list for this problem includes:    Lipitor 20 Mg Tabs (Atorvastatin calcium) ..... One once daily  Orders: T-Lipid Profile [phone])   Problem # 4:  ABDOMINAL WALL HERNIA (ICD-553.20) Etiology d/w patient. Reassurance given. Weight loss is recommended  Complete Medication List: 1)  Lasix 80 Mg Tabs (Furosemide) .... One by mouth two times a day 2)  Lipitor 20 Mg Tabs (Atorvastatin calcium) .... One once daily 3)  Klor-con M20 20 Meq Tbcr (Potassium chloride crys cr) .... One once daily 4)  Allegra-d 12 Hour 60-120 Mg Tb12 (Fexofenadine-pseudoephedrine) .... One tab q 12 hours as needed allergies 5)  Albuterol 90 Mcg/act Aers (Albuterol) .... Two puffs q 4-6 hours as needed for shortness of breath 6)  Clonidine Hcl 0.1 Mg Tabs (Clonidine hcl) .... One tab by mouth two times a day 7)  Nabumetone 500 Mg Tabs (Nabumetone) .... One by mouth q 12 hours as needed pain 8)  Avapro 150 Mg Tabs (Irbesartan) .... One by mouth q am 9)  Oxycontin 10 Mg Tb12 (Oxycodone hcl) .... Take 1 tablet by mouth every 12 hours for pain 10)  Albuterol Sulfate (2.5 Mg/33ml) 0.083% Nebu (Albuterol sulfate) .... Use  one ampule per nebulizer every 6-8 hours as needed sob 11)  Nebulizer Misc (Nebulizers) .... One nebulizer with supplies for home use.diagnosis is asthma 12)  Advair Diskus 250-50 Mcg/dose Misc (Fluticasone-salmeterol) .Marland Kitchen.. 1 puff 2 times daily   Patient Instructions: 1)  Call office in 2 weeks if you have not received the appointment for your lung function tests in the mail. 2)  Sign a release for [name] to use your neighbor as a phone contact.   Prescriptions: ADVAIR DISKUS 250-50 MCG/DOSE MISC (FLUTICASONE-SALMETEROL) 1 puff 2 times daily  #1 x 5   Entered and Authorized by:   Milagros Evener MD   Signed by:   Milagros Evener MD on 12/01/2007   Method used:   Print then Give to Patient   [id number] NEBULIZER   MISC (NEBULIZERS) One nebulizer with supplies for home use.Diagnosis is asthma  #1 x 0   Entered and Authorized by:   Milagros Evener MD   Signed by:   Milagros Evener MD on 12/01/2007   Method used:   Print then Give to Patient   RxID:   [id number] ALBUTEROL SULFATE (2.5 MG/3ML) 0.083%  NEBU (ALBUTEROL SULFATE) Use  one ampule per nebulizer every 6-8 hours as needed SOB  #100 x 11   Entered and Authorized by:   Milagros Evener MD   Signed by:   Milagros Evener MD on 12/01/2007   Method used:   Print then Give to Patient   RxID:   [id number] OXYCONTIN 10 MG  TB12 (OXYCODONE HCL) Take 1 tablet by mouth every 12 hours for pain  #60 x 0   Entered and Authorized by:   Milagros Evener MD   Signed by:   Milagros Evener MD on 12/01/2007   Method used:   Print then Give to Patient   RxID:   [id number] AVAPRO 150 MG TABS (IRBESARTAN) one by mouth q am  #31 Tablet x 5   Entered and Authorized by:   Milagros Evener MD   Signed by:   Milagros Evener MD on 12/01/2007   Method used:   Electronically to        [facility]. [phone]* (retail)       [place].       [place], [place]  [address]       Ph: [phone]       Fax: [phone]   RxID:   [id number] NABUMETONE 500 MG TABS (NABUMETONE) one by mouth q 12 hours as needed pain  #60 x 5   Entered and Authorized by:   Milagros Evener MD   Signed by:   Milagros Evener MD on 12/01/2007   Method used:   Electronically to        [facility]. [phone]* (retail)       [place].       [place], [place]  [address]       Ph: [phone]       Fax: [phone]   RxID:   [id number] CLONIDINE HCL 0.1 MG TABS (CLONIDINE HCL) one tab by mouth two times a day  #60 Tablet x 5   Entered and Authorized by:   Milagros Evener MD   Signed by:   Milagros Evener MD on 12/01/2007   Method used:    Electronically to        [facility]. [phone]* (retail)       [place].       [place], [place]  [address]       Ph: [phone]       Fax: [phone]   RxID:   [id number] ALBUTEROL 90 MCG/ACT AERS (ALBUTEROL) two puffs q 4-6 hours as needed for shortness of breath  #8.5 Gram x 5   Entered and Authorized by:   Milagros Evener MD   Signed by:   Milagros Evener MD on 12/01/2007   Method used:   Electronically to        [facility]. [phone]* (retail)       [place].       [place], [place]  [address]       Ph: [phone]       Fax: [phone]   RxID:   [id number] ALLEGRA-D 12 HOUR 60-120 MG TB12 (FEXOFENADINE-PSEUDOEPHEDRINE) one tab q 12 hours as needed allergies  #30 x 5   Entered and Authorized by:   Milagros Evener MD   Signed by:   Milagros Evener MD on  12/01/2007   Method used:   Electronically to        [facility]. [phone]* (retail)       [place].       [place], [place]  [address]       Ph: [phone]       Fax: [phone]   RxID:   [id number] LIPITOR 20 MG TABS (ATORVASTATIN CALCIUM) one once daily  #30 x 5   Entered and Authorized by:   Milagros Evener MD   Signed by:   Milagros Evener MD on 12/01/2007   Method used:   Electronically to        [facility]. [phone]* (retail)       [place].       [place], [place]  [address]       Ph: [phone]       Fax: [phone]   RxID:   [id number] LASIX 80 MG TABS (FUROSEMIDE) one by mouth two times a day  #60 x 5   Entered and Authorized by:   Milagros Evener MD   Signed by:   Milagros Evener MD on 12/01/2007   Method used:   Electronically to        [facility]. [phone]* (retail)       [place].       [place], [place]  [address]       Ph: [phone]       Fax:  [phone]   RxID:   [id number]  ]

## 2010-05-25 NOTE — Letter (Signed)
Summary: *HSN Results Follow up  [place], [place] [address]   Phone: [phone]  Fax: [phone]      10/18/2009   [name] [address] [address], [place]  [address]   Dear  [name],                            ____S.Drinkard,FNP   ____D. Gore,FNP       ____B. McPherson,MD   ____V. Rankins,MD    ____E. Mulberry,MD    ____N. Hassell Done, FNP  ____D. Jobe Igo, MD    ____K. Tomma Lightning, MD    ____Other     This letter is to inform you that your recent test(s):  _______Pap Smear    _______Lab Test     _______X-ray    _______ is within acceptable limits  _______ requires a medication change  _______ requires a follow-up lab visit  _______ requires a follow-up visit with your provider   Comments:  We have been trying to contact you.  Please call the office a call at your convenience.       _________________________________________________________ If you have any questions, please contact our office                     Sincerely,  [place] Shannon [facility]

## 2010-05-25 NOTE — Letter (Signed)
Summary: *HSN Results Follow up  [place], [place] [address]   Phone: [phone]  Fax: [phone]      07/28/2009   [name],   [address]   Dear  [name],                            ____S.Drinkard,FNP   ____D. Gore,FNP       ____B. McPherson,MD   ____V. Rankins,MD    ____E. Mulberry,MD    ____N. Hassell Done, FNP  ____D. Jobe Igo, MD    ____K. Tomma Lightning, MD    ____Other     This letter is to inform you that your recent test(s):  _______Pap Smear    _______Lab Test     _______X-ray    _______ is within acceptable limits  _______ requires a medication change  _______ requires a follow-up lab visit  _______ requires a follow-up visit with your provider   Comments:  We have been trying to reach you.  Please give the office a call at your earliest convenience.       _________________________________________________________ If you have any questions, please contact our office                     Sincerely,  [place] Shannon [facility]

## 2010-05-25 NOTE — Assessment & Plan Note (Signed)
Summary: SLEEPING PROBLEM/ BACK PAIN//GK   Vital Signs:  Patient profile:   70 year old male Height:      70.25 inches Weight:      401 pounds O2 Sat:      95 % on Room air Temp:     98.1 degrees F oral Pulse rate:   87 / minute Pulse rhythm:   regular Resp:     22 per minute BP sitting:   140 / 80  (left arm) Cuff size:   thigh  Vitals Entered By: [place] Shannon (July 15, 2009 10:32 AM)  O2 Flow:  Room air CC: pt here for back pain and unable to sleep at night...., Hypertension Management Is Patient Diabetic? No Pain Assessment Patient in pain? no       Does patient need assistance? Functional Status Self care Ambulation Normal Comments PF 1.   290       2.  220      3.  240   Primary Care Provider:  Richardson Dopp PA-C  CC:  pt here for back pain and unable to sleep at night.... and Hypertension Management.  History of Present Illness: Here for f/u. Back pain is getting better overall.  No radicular symptoms.  No loss of b/b fxn.  Does take oxycontin infrequently.  No refill since Oct.  HTN:  Taking meds.  Will have to pull refill hx with his pharmacy.  OUr records do not indicate regular refills.  Lipids:  Needs LFTs checked.  Sleep apnea:  still refuses further w/u and mgmt  Obesity:  Weight loss stagnant.  Wants to lose another 50 pounds.  Asthma:  Out of advair.  Using neb more.  Got primatime mist OTC.  Did not fill proventil.  Allergies:  C/o a lot of nasal congestion.  Not taking allegra D right now.  Asthma History    Asthma Control Assessment:    Age range: 12+ years    Symptoms: 0-2 days/week    Nighttime Awakenings: 0-2/month    Interferes w/ normal activity: no limitations    SABA use (not for EIB): >2 days/week    Asthma Control Assessment: Not Well Controlled  Hypertension History:      He denies headache, chest pain, dyspnea with exertion, and syncope.  He notes no problems with any antihypertensive medication side effects.    Positive major cardiovascular risk factors include male age 25 years old or older, hyperlipidemia, and hypertension.  Negative major cardiovascular risk factors include negative family history for ischemic heart disease and non-tobacco-user status.    Problems Prior to Update: 1)  Preventive Health Care  (ICD-V70.0) 2)  Hyperglycemia  (ICD-790.29) 3)  Abdominal Wall Hernia  (ICD-553.20) 4)  Obesity, Morbid  (ICD-278.01) 5)  Sleep Apnea, Obstructive  (ICD-327.23) 6)  Superficial Phlebitis  (ICD-451.0) 7)  Allergic Rhinitis, Seasonal  (ICD-477.0) 8)  Low Back Pain, Chronic  (ICD-724.2) 9)  Hyperlipidemia  (ICD-272.4) 10)  Asthma  (ICD-493.90) 11)  Hypertension  (ICD-401.9)  Current Medications (verified): 1)  Lasix 80 Mg Tabs (Furosemide) .... One By Mouth Two Times A Day 2)  Lipitor 20 Mg Tabs (Atorvastatin Calcium) .... One Once Daily 3)  Klor-Con M20 20 Meq Tbcr (Potassium Chloride Crys Cr) .... One Once Daily 4)  Allegra-D 12 Hour 60-120 Mg Tb12 (Fexofenadine-Pseudoephedrine) .... One Tab Q 12 Hours As Needed Allergies 5)  Ventolin Hfa 108 (90 Base) Mcg/act Aers (Albuterol Sulfate) .Marland Kitchen.. 1-2 Puffs Every 4-6 Hours As Needed For Shortness of  Breath 6)  Clonidine Hcl 0.1 Mg Tabs (Clonidine Hcl) .... One Tab By Mouth Two Times A Day 7)  Nabumetone 500 Mg Tabs (Nabumetone) .... One By Mouth Q 12 Hours As Needed Pain 8)  Avapro 150 Mg Tabs (Irbesartan) .... One By Mouth Q Am 9)  Oxycontin 10 Mg  Tb12 (Oxycodone Hcl) .... Take 1 Tablet By Mouth Every 12 Hours For Pain 10)  Albuterol Sulfate (2.5 Mg/80ml) 0.083%  Nebu (Albuterol Sulfate) .... Use  One Ampule Per Nebulizer Every 6-8 Hours As Needed Sob 11)  Nebulizer   Misc (Nebulizers) .... One Nebulizer With Supplies For Home Use.diagnosis Is Asthma 12)  Advair Diskus 250-50 Mcg/dose Misc (Fluticasone-Salmeterol) .Marland Kitchen.. 1 Puff 2 Times Daily  Allergies (verified): No Known Drug Allergies  Past History:  Past Medical History: Last updated:  12/02/2008 Hypertension (04/01/2000) Asthma (10/11/1997) Hyperlipidemia (08/12/2002) Venous Statis Disease (03/28/2004) Sleep Apnea..2005...study very difficult for him as he is awake routinely at night,clausterphobic,with suspected social anxiety.He has not kept multiple f/u appts for CPAP titration.Hecontinues to decline treatment 08/2008. Chest Pain - Admit [facility] 05/2008 (r/o for MI; no further w/u pursued; saw cardiology; rec. further eval if pt. has recurrent symptoms; chest CT demonstarted coronary art. disease/calcification)  Physical Exam  General:  alert, well-developed, and well-nourished.   Head:  normocephalic and atraumatic.   Eyes:  pupils equal, pupils round, and pupils reactive to light.   Nose:  no mucosal edema and mucosal erythema.   Mouth:  pharynx pink and moist.   Neck:  supple and no cervical lymphadenopathy.   Lungs:  normal breath sounds, no crackles, and no wheezes.   Heart:  normal rate and regular rhythm.   Abdomen:  normal bowel sounds.   Extremities:  1+ left pedal edema and 1+ right pedal edema.   Neurologic:  alert & oriented X3 and cranial nerves II-XII intact.   Psych:  normally interactive, good eye contact, and not anxious appearing.     Impression & Recommendations:  Problem # 1:  LOW BACK PAIN, CHRONIC (ICD-724.2)  pain overall seems to be getting better should have a UDS today but has not taken med in over a  would expect his urine to be negative reviewed rec's from Dr. Radene Ou and the plan was to send him to neurosurg or pain clinic if pain gets worse he has not had med filled since Oct 2010 which tells me that he is using it less and less refill meds today  His updated medication list for this problem includes:    Nabumetone 500 Mg Tabs (Nabumetone) ..... One by mouth q 12 hours as needed pain    Oxycontin 10 Mg Tb12 (Oxycodone hcl) .Marland Kitchen... Take 1 tablet by mouth every 12 hours for pain  Orders: T-Drug Screen-Urine, (single)  [phone])  Problem # 2:  OBESITY, MORBID (ICD-278.01) weight loss s/w stagnant he is committed to try to lose more he feels like weight loss is helping his back pain  Problem # 3:  HYPERTENSION (ICD-401.9)  needs baseline ecg borderline control lost some sleep last night   His updated medication list for this problem includes:    Lasix 80 Mg Tabs (Furosemide) ..... One by mouth two times a day    Clonidine Hcl 0.1 Mg Tabs (Clonidine hcl) ..... One tab by mouth two times a day    Avapro 150 Mg Tabs (Irbesartan) ..... One by mouth q am  Orders: EKG w/ Interpretation (93000)  Problem # 4:  HYPERLIPIDEMIA (ICD-272.4) check LFTs  His updated medication list for this problem includes:    Lipitor 20 Mg Tabs (Atorvastatin calcium) ..... One once daily  Orders: T-Comprehensive Metabolic Panel ([id number])  Problem # 5:  HYPERGLYCEMIA (ICD-790.29) note on prob list will check A1C to f/u  Orders: T- Hemoglobin A1C [id number])  Problem # 6:  ALLERGIC RHINITIS, SEASONAL (ICD-477.0) having a lot of congestion causing him to stay awake at night ran out of allegra D prob not good with HTN to decong in med will change to plain allegra and give him flonase to use  Problem # 7:  ASTHMA (ICD-493.90) out of advair using neb more using primatime mist advised him to stop will refill advair and proventil  His updated medication list for this problem includes:    Ventolin Hfa 108 (90 Base) Mcg/act Aers (Albuterol sulfate) .Marland Kitchen... 1-2 puffs every 4-6 hours as needed for shortness of breath    Albuterol Sulfate (2.5 Mg/90ml) 0.083% Nebu (Albuterol sulfate) ..... Use  one ampule per nebulizer every 6-8 hours as needed sob    Advair Diskus 250-50 Mcg/dose Misc (Fluticasone-salmeterol) .Marland Kitchen... 1 puff 2 times daily  Complete Medication List: 1)  Lasix 80 Mg Tabs (Furosemide) .... One by mouth two times a day 2)  Lipitor 20 Mg Tabs (Atorvastatin calcium) .... One once daily 3)  Klor-con  M20 20 Meq Tbcr (Potassium chloride crys cr) .... One once daily 4)  Ventolin Hfa 108 (90 Base) Mcg/act Aers (Albuterol sulfate) .Marland Kitchen.. 1-2 puffs every 4-6 hours as needed for shortness of breath 5)  Clonidine Hcl 0.1 Mg Tabs (Clonidine hcl) .... One tab by mouth two times a day 6)  Nabumetone 500 Mg Tabs (Nabumetone) .... One by mouth q 12 hours as needed pain 7)  Avapro 150 Mg Tabs (Irbesartan) .... One by mouth q am 8)  Oxycontin 10 Mg Tb12 (Oxycodone hcl) .... Take 1 tablet by mouth every 12 hours for pain 9)  Albuterol Sulfate (2.5 Mg/30ml) 0.083% Nebu (Albuterol sulfate) .... Use  one ampule per nebulizer every 6-8 hours as needed sob 10)  Nebulizer Misc (Nebulizers) .... One nebulizer with supplies for home use.diagnosis is asthma 11)  Advair Diskus 250-50 Mcg/dose Misc (Fluticasone-salmeterol) .Marland Kitchen.. 1 puff 2 times daily 12)  Allegra 180 Mg Tabs (Fexofenadine hcl) .... Take 1 tablet by mouth once a day as needed for allergies 13)  Flonase 50 Mcg/act Susp (Fluticasone propionate) .... 2 sprays each nostril once daily (use for 3 weeks, then take one week off) use as needed for allergies  Hypertension Assessment/Plan:      The patient's hypertensive risk group is category B: At least one risk factor (excluding diabetes) with no target organ damage.  His calculated 10 year risk of coronary heart disease is 11 %.  Today's blood pressure is 140/80.      Patient Instructions: 1)  Please schedule a follow-up appointment in 3 months for blood pressure.  2)  Use flonase when allergies are bad and use for 3 weeks and take one week off. Prescriptions: FLONASE 50 MCG/ACT SUSP (FLUTICASONE PROPIONATE) 2 sprays each nostril once daily (use for 3 weeks, then take one week off) Use as needed for allergies  #1 x 5   Entered and Authorized by:   Richardson Dopp PA-C   Signed by:   Richardson Dopp PA-C on 07/15/2009   Method used:   Print then Give to Patient   RxID:   [id number] ALLEGRA 180 MG TABS  (FEXOFENADINE HCL) Take 1 tablet by mouth once  a day as needed for allergies  #30 x 5   Entered and Authorized by:   Richardson Dopp PA-C   Signed by:   Richardson Dopp PA-C on 07/15/2009   Method used:   Print then Give to Patient   RxID:   [id number] VENTOLIN HFA 108 (90 BASE) MCG/ACT AERS (ALBUTEROL SULFATE) 1-2 puffs every 4-6 hours as needed for shortness of breath  #1 x 5   Entered and Authorized by:   Richardson Dopp PA-C   Signed by:   Richardson Dopp PA-C on 07/15/2009   Method used:   Print then Give to Patient   RxID:   [id number] ADVAIR DISKUS 250-50 MCG/DOSE MISC (FLUTICASONE-SALMETEROL) 1 puff 2 times daily  #1 x 5   Entered and Authorized by:   Richardson Dopp PA-C   Signed by:   Richardson Dopp PA-C on 07/15/2009   Method used:   Print then Give to Patient   RxID:   [id number] OXYCONTIN 10 MG  TB12 (OXYCODONE HCL) Take 1 tablet by mouth every 12 hours for pain  #60 x 0   Entered and Authorized by:   Richardson Dopp PA-C   Signed by:   Richardson Dopp PA-C on 07/15/2009   Method used:   Print then Give to Patient   RxID:   [id number] NABUMETONE 500 MG TABS (NABUMETONE) one by mouth q 12 hours as needed pain  #60 x 5   Entered and Authorized by:   Richardson Dopp PA-C   Signed by:   Richardson Dopp PA-C on 07/15/2009   Method used:   Print then Give to Patient   RxID:   [id number]    EKG  Procedure date:  07/15/2009  Findings:      Normal sinus rhythm with rate of:  86 normal axis no isch changes

## 2010-05-25 NOTE — Assessment & Plan Note (Signed)
Summary: BACK PAIN & MEDS REFILL / NS   Vital Signs:  Patient profile:   70 year old male Height:      70.25 inches Temp:     98.2 degrees F oral Pulse rate:   80 / minute Pulse rhythm:   regular Resp:     18 per minute BP sitting:   140 / 82  (left arm) Cuff size:   large  Vitals Entered By: [place] Shannon (Sep 07, 2008 3:06 PM)  CC:  pt is here for med refills on oxycotin and vicodin.....  History of Present Illness: Here for f/u visit. Says he thinks he is losing weight but has not checked on scale at [facility] exceeds our scale). He prefers to work on weight himself rather than see a nutritionist. He is trying to get out of his home more. He likes to play pool but finds is a strain on his back and needs pain medicine.  This is the extend of his social activity and is to be encouraged. he is due to sign a new pain contract. We discussed that the oxycontin was to replace his vicodin use. He did not realize that the nabumetone could be used regularly. Recommended he use the nabumetone BId;especially on days that he will be driving.Then can take the oxycontin on returning home.   Current Medications (verified): 1)  Lasix 80 Mg Tabs (Furosemide) .... One By Mouth Two Times A Day 2)  Lipitor 20 Mg Tabs (Atorvastatin Calcium) .... One Once Daily 3)  Klor-Con M20 20 Meq Tbcr (Potassium Chloride Crys Cr) .... One Once Daily 4)  Allegra-D 12 Hour 60-120 Mg Tb12 (Fexofenadine-Pseudoephedrine) .... One Tab Q 12 Hours As Needed Allergies 5)  Albuterol 90 Mcg/act Aers (Albuterol) .... Two Puffs Q 4-6 Hours As Needed For Shortness of Breath 6)  Clonidine Hcl 0.1 Mg Tabs (Clonidine Hcl) .... One Tab By Mouth Two Times A Day 7)  Nabumetone 500 Mg Tabs (Nabumetone) .... One By Mouth Q 12 Hours As Needed Pain 8)  Avapro 150 Mg Tabs (Irbesartan) .... One By Mouth Q Am 9)  Oxycontin 10 Mg  Tb12 (Oxycodone Hcl) .... Take 1 Tablet By Mouth Every 12 Hours For Pain 10)  Albuterol Sulfate  (2.5 Mg/77ml) 0.083%  Nebu (Albuterol Sulfate) .... Use  One Ampule Per Nebulizer Every 6-8 Hours As Needed Sob 11)  Nebulizer   Misc (Nebulizers) .... One Nebulizer With Supplies For Home Use.diagnosis Is Asthma 12)  Advair Diskus 250-50 Mcg/dose Misc (Fluticasone-Salmeterol) .Marland Kitchen.. 1 Puff 2 Times Daily  Allergies (verified): No Known Drug Allergies  Past History:  Past Medical History:    Hypertension (04/01/2000)    Asthma (10/11/1997)    Hyperlipidemia (08/12/2002)    Venous Statis Disease (03/28/2004)    Sleep Apnea..2005...study very difficult for him as he is awake routinely at night,clausterphobic,with suspected social anxiety.He has not kept multiple f/u appts for CPAP titration.Hecontinues to decline treatment 08/2008.  Social History:    Lives alone.    drives    no phone.  Physical Exam  General:  Well-developed,well-nourished,in no acute distress; alert,appropriate and cooperative throughout examination Lungs:  Normal respiratory effort, chest expands symmetrically. Lungs are clear to auscultation, no crackles or wheezes. Heart:  Normal rate and regular rhythm. S1 and S2 normal without gallop, murmur, click, rub or other extra sounds.   Impression & Recommendations:  Problem # 1:  LOW BACK PAIN, CHRONIC (ICD-724.2) Weight is major factor and patient is aware. Pt has been  using  ~#60 oxycontin every 2 months.He is to sign a new pain contract for EMR today. Sedative side effects of oxycontin d/w patient.  His updated medication list for this problem includes:    Nabumetone 500 Mg Tabs (Nabumetone) ..... One by mouth q 12 hours as needed pain    Oxycontin 10 Mg Tb12 (Oxycodone hcl) .Marland Kitchen... Take 1 tablet by mouth every 12 hours for pain  Problem # 2:  SLEEP APNEA, OBSTRUCTIVE (ICD-327.23) Pt prefers to "let it go" on further sleep study...Marland Kitchenhe is aware of risks of untreated sleep apnea. Pt continues to work on weight...highest weight 467 lbs  ~2008...lost down to 400 but  gianed 14 lbs back per [id number] lbs) in hospital. Pt feels that he can lose 5 lbs per month until end of this year...plans to start walking when gets in the 370's. We discussed doing chair exercises.  Problem # 3:  HYPERTENSION (ICD-401.9)  His updated medication list for this problem includes:    Lasix 80 Mg Tabs (Furosemide) ..... One by mouth two times a day    Clonidine Hcl 0.1 Mg Tabs (Clonidine hcl) ..... One tab by mouth two times a day    Avapro 150 Mg Tabs (Irbesartan) ..... One by mouth q am  Complete Medication List: 1)  Lasix 80 Mg Tabs (Furosemide) .... One by mouth two times a day 2)  Lipitor 20 Mg Tabs (Atorvastatin calcium) .... One once daily 3)  Klor-con M20 20 Meq Tbcr (Potassium chloride crys cr) .... One once daily 4)  Allegra-d 12 Hour 60-120 Mg Tb12 (Fexofenadine-pseudoephedrine) .... One tab q 12 hours as needed allergies 5)  Albuterol 90 Mcg/act Aers (Albuterol) .... Two puffs q 4-6 hours as needed for shortness of breath 6)  Clonidine Hcl 0.1 Mg Tabs (Clonidine hcl) .... One tab by mouth two times a day 7)  Nabumetone 500 Mg Tabs (Nabumetone) .... One by mouth q 12 hours as needed pain 8)  Avapro 150 Mg Tabs (Irbesartan) .... One by mouth q am 9)  Oxycontin 10 Mg Tb12 (Oxycodone hcl) .... Take 1 tablet by mouth every 12 hours for pain 10)  Albuterol Sulfate (2.5 Mg/1ml) 0.083% Nebu (Albuterol sulfate) .... Use  one ampule per nebulizer every 6-8 hours as needed sob 11)  Nebulizer Misc (Nebulizers) .... One nebulizer with supplies for home use.diagnosis is asthma 12)  Advair Diskus 250-50 Mcg/dose Misc (Fluticasone-salmeterol) .Marland Kitchen.. 1 puff 2 times daily  Patient Instructions: 1)  Keep up the good work on losing weight!! 2)  Please schedule a follow-up appointment as needed .  Prescriptions: OXYCONTIN 10 MG  TB12 (OXYCODONE HCL) Take 1 tablet by mouth every 12 hours for pain  #60 x 0   Entered and Authorized by:   Milagros Evener MD   Signed by:   Milagros Evener MD on 09/07/2008   Method used:   Print then Give to Patient   RxID:   [id number]

## 2010-05-25 NOTE — Letter (Signed)
Summary: MEDICAID [place] FOR OXYCONTIN  MEDICAID [place] FOR OXYCONTIN   Imported By: Roland Earl 05/07/2007 15:30:22  _____________________________________________________________________  External Attachment:    Type:   Image     Comment:   External Document

## 2010-05-25 NOTE — Assessment & Plan Note (Signed)
Summary: BACK PAIN//GK   Vital Signs:  Patient profile:   70 year old male Height:      70.25 inches Weight:      414 pounds BMI:     59.19 Temp:     97.9 degrees F oral Pulse rate:   82 / minute Pulse rhythm:   regular Resp:     18 per minute BP sitting:   138 / 84  (left arm) Cuff size:   large  Vitals Entered By: [place] Shannon (December 02, 2008 11:20 AM) CC: pt is here for back pain...Marland KitchenMarland Kitchen pt says he was in an accident and two disks is on a nerve...Marland KitchenMarland Kitchen  pt says he is out of pain med... Is Patient Diabetic? No Pain Assessment Patient in pain? no       Does patient need assistance? Functional Status Self care Ambulation Normal   Primary Care Provider:  Richardson Dopp PA-C  CC:  pt is here for back pain...Marland KitchenMarland Kitchen pt says he was in an accident and two disks is on a nerve...Marland KitchenMarland Kitchen  pt says he is out of pain med....  History of Present Illness: F/u for pain meds.  Reviewed extensive notes from Dr. Radene Ou.  He has entered into pain contract in past.  I cannot find it in EMR.  He is generally doing well.  He denies any change in his back symptoms.  He has flare-ups of the back pain 4-5 x per month.  He gets a 2 month supply of oxycontin, but historically only fills it every 3 months.  He still takes nabumetone when he can.  He is working on weight loss.  He has lost from 467 to 414 lbs.  He has changed his diet.  He has not been able to exercise b/c of the heat.  He is trying to get down to 350lbs.  He notes occ. bilat. leg pain.  No weakness.   His breathing is unchanged.     Allergies: No Known Drug Allergies  Past History:  Past Surgical History: Last updated: 08/31/2006 Sleep Study (03/26/2004) Open MRI-Lumbar  (11/27/2004)  Risk Factors: Smoking Status: never (03/31/2007)  Past Medical History: Hypertension (04/01/2000) Asthma (10/11/1997) Hyperlipidemia (08/12/2002) Venous Statis Disease (03/28/2004) Sleep Apnea..2005...study very difficult for him as he is awake routinely  at night,clausterphobic,with suspected social anxiety.He has not kept multiple f/u appts for CPAP titration.Hecontinues to decline treatment 08/2008. Chest Pain - Admit [facility] 05/2008 (r/o for MI; no further w/u pursued; saw cardiology; rec. further eval if pt. has recurrent symptoms; chest CT demonstarted coronary art. disease/calcification)  Physical Exam  General:  alert, well-developed, and well-nourished.   Head:  normocephalic and atraumatic.   Neck:  supple and full ROM.   Lungs:  no crackles and no wheezes.   Heart:  normal rate, regular rhythm, and no murmur.   Abdomen:  soft and non-tender.   Msk:  scaliness of feet noted bilat. Extremities:  2+ brawny edema bilat. Neurologic:  alert & oriented X3 and cranial nerves II-XII intact.   Psych:  normally interactive.     Impression & Recommendations:  Problem # 1:  LOW BACK PAIN, CHRONIC (ICD-724.2) As noted, I have reviewed his hx.  Based upon his hx. and my initial meeting with the patient, I am comfortable continuing his oxycontin Rx. Will pull his records from his pharmacy. Will also get him to sign a new pain contract and put in EMR. He cannot supply a urine today.  Will try to get a UDS  next time.  ** Unable to print Rx on tamper proof paper - had to give patient handwritten Rx ** His updated medication list for this problem includes:    Nabumetone 500 Mg Tabs (Nabumetone) ..... One by mouth q 12 hours as needed pain    Oxycontin 10 Mg Tb12 (Oxycodone hcl) .Marland Kitchen... Take 1 tablet by mouth every 12 hours for pain  Problem # 2:  HYPERTENSION (ICD-401.9) Controlled. Check labs to f/u on renal fxn and K+ WIll need to get a U/A and microalbumin next time  His updated medication list for this problem includes:    Lasix 80 Mg Tabs (Furosemide) ..... One by mouth two times a day    Clonidine Hcl 0.1 Mg Tabs (Clonidine hcl) ..... One tab by mouth two times a day    Avapro 150 Mg Tabs (Irbesartan) ..... One by mouth q  am  Orders: T-Comprehensive Metabolic Panel ([id number])  Problem # 3:  HYPERLIPIDEMIA (B2193296.4) He is esentially fasting Will get lipids and CMET today  His updated medication list for this problem includes:    Lipitor 20 Mg Tabs (Atorvastatin calcium) ..... One once daily  Orders: T-Comprehensive Metabolic Panel (A999333) T-Lipid Profile [id number])  Problem # 4:  HYPERGLYCEMIA (ICD-790.29) He has had high sugars in the past with normal A1Cs He seemed to think he was on a DM medicine, although I don't see one in his list. Will add A1C to his labs  Orders: T-Comprehensive Metabolic Panel ([id number]) T- Hemoglobin A1C [id number])  Problem # 5:  Preventive Health Care (ICD-V70.0) Do not see a PSA or hemoccults. Discussed colonoscopy He prefers to defer for now Check stool card, PSA and CBC Orders: T-PSA [id number]) Hemoccult Cards -3 specimans (take home) [id number]) T-CBC w/Diff [id number])  Problem # 6:  OBESITY, MORBID (ICD-278.01) Losing weight I congratulated him Will get TSH with labs  Orders: T-TSH [id number])  Problem # 7:  SLEEP APNEA, OBSTRUCTIVE (ICD-327.23) Has refused CPAP tx. in past  Problem # 8:  ASTHMA (ICD-493.90) Peak flows in 200 range Not sure about his effort Breathing seemed to be stable today Will review at f/u and determine if he needs adjustment in Rx or PFTs  His updated medication list for this problem includes:    Albuterol 90 Mcg/act Aers (Albuterol) .Marland Kitchen..Marland Kitchen Two puffs q 4-6 hours as needed for shortness of breath    Albuterol Sulfate (2.5 Mg/5ml) 0.083% Nebu (Albuterol sulfate) ..... Use  one ampule per nebulizer every 6-8 hours as needed sob    Advair Diskus 250-50 Mcg/dose Misc (Fluticasone-salmeterol) .Marland Kitchen... 1 puff 2 times daily  Complete Medication List: 1)  Lasix 80 Mg Tabs (Furosemide) .... One by mouth two times a day 2)  Lipitor 20 Mg Tabs (Atorvastatin calcium) .... One once daily 3)  Klor-con M20 20 Meq  Tbcr (Potassium chloride crys cr) .... One once daily 4)  Allegra-d 12 Hour 60-120 Mg Tb12 (Fexofenadine-pseudoephedrine) .... One tab q 12 hours as needed allergies 5)  Albuterol 90 Mcg/act Aers (Albuterol) .... Two puffs q 4-6 hours as needed for shortness of breath 6)  Clonidine Hcl 0.1 Mg Tabs (Clonidine hcl) .... One tab by mouth two times a day 7)  Nabumetone 500 Mg Tabs (Nabumetone) .... One by mouth q 12 hours as needed pain 8)  Avapro 150 Mg Tabs (Irbesartan) .... One by mouth q am 9)  Oxycontin 10 Mg Tb12 (Oxycodone hcl) .... Take 1 tablet by mouth every 12 hours for pain 10)  Albuterol Sulfate (2.5 Mg/21ml)  0.083% Nebu (Albuterol sulfate) .... Use  one ampule per nebulizer every 6-8 hours as needed sob 11)  Nebulizer Misc (Nebulizers) .... One nebulizer with supplies for home use.diagnosis is asthma 12)  Advair Diskus 250-50 Mcg/dose Misc (Fluticasone-salmeterol) .Marland Kitchen.. 1 puff 2 times daily  Patient Instructions: 1)  Please schedule a follow-up appointment in 2 months for pain medicines and blood pressure.  Prescriptions: OXYCONTIN 10 MG  TB12 (OXYCODONE HCL) Take 1 tablet by mouth every 12 hours for pain  #60 x 0   Entered and Authorized by:   Richardson Dopp PA-C   Signed by:   Richardson Dopp PA-C on 12/02/2008   Method used:   Print then Give to Patient   RxID:   [id number]    CT of Chest  Procedure date:  06/06/2008  Findings:      Findings: This a technically satisfactory study.    There are no filling defects in the pulmonary arterial system to   suggest pulmonary emboli.   No evidence of thoracic aortic aneurysm is identified.   Coronary artery atherosclerotic calcifications are noted.   No pleural or pericardial effusions are identified.   Calcified left hilar lymph nodes are compatible with prior   granulomatous disease.   Mild bibasilar atelectasis is identified, but the lungs are   otherwise clear.   No evidence of focal airspace disease, consolidation, or  pulmonary   masses.   No endotracheal or endobronchial lesions are identified.    No acute or suspicious bony abnormalities are present.    IMPRESSION:   Mild bibasilar atelectasis, otherwise no evidence of acute   abnormality.    No evidence of pulmonary emboli.    Coronary artery disease.     Appended Document: BACK PAIN//GK   Serial Vital Signs/Assessments:  Comments: PF   1.         240              2.  230                 3.   240 By: [place] Shannon   Weight > 350 lbs. Yes

## 2010-05-25 NOTE — Letter (Signed)
Summary: *[facility] Results Follow up  [place], [place] [address]   Phone: [phone]  Fax: [phone]      02/28/2009   KEONDRE GAUDREAU [id number], [place]  [address]   Dear  Mr. Navarre Iverson,                            ____S.Drinkard,FNP   ____D. Gore,FNP       ____B. McPherson,MD   ____V. Rankins,MD    ____E. Mulberry,MD    ____N. Hassell Done, FNP  ____D. Jobe Igo, MD    ____K. Tomma Lightning, MD    __X__Other     This letter is to inform you that your recent test(s):  _______Pap Smear    _______Lab Test     _______X-ray    _______ is within acceptable limits  _______ requires a medication change  _______ requires a follow-up lab visit  _______ requires a follow-up visit with your provider   Comments:  We have been trying to contact you.  Please give the office a call at your earliest convenience.       _________________________________________________________ If you have any questions, please contact our office                     Sincerely,  [place] Shannon [facility]

## 2010-05-25 NOTE — Letter (Signed)
Summary: INFLUENZA VACCINATION  INFLUENZA VACCINATION   Imported By: Roland Earl 04/01/2007 14:35:41  _____________________________________________________________________  External Attachment:    Type:   Image     Comment:   External Document

## 2010-05-25 NOTE — Progress Notes (Signed)
Summary: Switch from avapro to lisinopril 20mg /Medicare D not cover...  Phone Note Outgoing Call   Summary of Call: Review of his chart showed no contraindication to ACE-I and no listed allergies to meds. MD switched his avapro to lisinopril 20 mg Take 1 tablet by mouth every morning Form completed via [facility] [place] and to be faxed to [id number]... CMA to send letter to patient about med change if unable to reach by phone... Initial call taken by: Milagros Evener MD,  December 26, 2007 1:16 PM  Follow-up for Phone Call        mailed letter to pt as contact person does not want [name] calling him to give pt messages... Follow-up by: Vinetta Bergamo CMA,  December 30, 2007 11:33 AM

## 2010-05-25 NOTE — Assessment & Plan Note (Signed)
Summary: RENEW MEDICATIONS//GK   Vital Signs:  Patient profile:   70 year old male Height:      70.25 inches Weight:      403 pounds BMI:     57.62 Temp:     98.2 degrees F oral Pulse rate:   88 / minute Pulse rhythm:   regular Resp:     18 per minute BP sitting:   128 / 92  (left arm) Cuff size:   thigh  Vitals Entered By: Isla Pence (October 04, 2009 12:24 PM) Is Patient Diabetic? No Pain Assessment Patient in pain? no       Does patient need assistance? Functional Status Self care Ambulation Normal   Primary Care Provider:  Richardson Dopp PA-C   History of Present Illness: Here for f/u.  Back pain:  Reviewed records again.  Has a h/o DDD and L3 foraminal stenosis by open MRI in 2006.  Dr. Radene Ou had referred to neurosurgery but patient did not f/u.  She had rec referral to pain clinic if pain was not managed by her measures which included oxycontin.  He is asking how much weight he has to lose to get surgery on his back.  We talked about seeing neurosurgery vs. going to pain clinic (which is what I prefer), but he would like to hold off and see how things go.  This is reasonable b/c in the past he has had a low frequency of refills on his Oxycontin.  He does note worse pain this month but has been playing pool and the stooping seems to have exacerbated his pain.  No radicular symptoms.  No loss of b/b fxn.  HTN:  Reviewed his pharmacy records last time.  He has not filled Lasix, K+ and Avapro in a while.  Thinks he still takes lasix.  Will have him call here and tell nurse exactly what he is taking.  Asthma:  Back on Advair, but has run out again.  Knows to get refills.  Breathing improved with Advair and using Ventolin less.  Hyperglycemia:  Never could reach him to bring him in for glucose tolerance test.  Discussed this with him today.    Problems Prior to Update: 1)  Preventive Health Care  (ICD-V70.0) 2)  Hyperglycemia  (ICD-790.29) 3)  Abdominal Wall Hernia   (ICD-553.20) 4)  Obesity, Morbid  (ICD-278.01) 5)  Sleep Apnea, Obstructive  (ICD-327.23) 6)  Superficial Phlebitis  (ICD-451.0) 7)  Allergic Rhinitis, Seasonal  (ICD-477.0) 8)  Low Back Pain, Chronic  (ICD-724.2) 9)  Hyperlipidemia  (ICD-272.4) 10)  Asthma  (ICD-493.90) 11)  Hypertension  (ICD-401.9)  Current Medications (verified): 1)  Lasix 80 Mg Tabs (Furosemide) .... One By Mouth Two Times A Day 2)  Lipitor 20 Mg Tabs (Atorvastatin Calcium) .... One Once Daily 3)  Klor-Con M20 20 Meq Tbcr (Potassium Chloride Crys Cr) .... One Once Daily 4)  Ventolin Hfa 108 (90 Base) Mcg/act Aers (Albuterol Sulfate) .Marland Kitchen.. 1-2 Puffs Every 4-6 Hours As Needed For Shortness of Breath 5)  Clonidine Hcl 0.1 Mg Tabs (Clonidine Hcl) .... One Tab By Mouth Two Times A Day 6)  Nabumetone 500 Mg Tabs (Nabumetone) .... One By Mouth Q 12 Hours As Needed Pain 7)  Avapro 150 Mg Tabs (Irbesartan) .... One By Mouth Q Am 8)  Oxycontin 10 Mg  Tb12 (Oxycodone Hcl) .... Take 1 Tablet By Mouth Every 12 Hours For Pain 9)  Albuterol Sulfate (2.5 Mg/13ml) 0.083%  Nebu (Albuterol Sulfate) .... Use  One  Ampule Per Nebulizer Every 6-8 Hours As Needed Sob 10)  Nebulizer   Misc (Nebulizers) .... One Nebulizer With Supplies For Home Use.diagnosis Is Asthma 11)  Advair Diskus 250-50 Mcg/dose Misc (Fluticasone-Salmeterol) .Marland Kitchen.. 1 Puff 2 Times Daily 12)  Allegra 180 Mg Tabs (Fexofenadine Hcl) .... Take 1 Tablet By Mouth Once A Day As Needed For Allergies 13)  Flonase 50 Mcg/act Susp (Fluticasone Propionate) .... 2 Sprays Each Nostril Once Daily (Use For 3 Weeks, Then Take One Week Off) Use As Needed For Allergies  Allergies (verified): No Known Drug Allergies  Past History:  Past Medical History: Last updated: 12/02/2008 Hypertension (04/01/2000) Asthma (10/11/1997) Hyperlipidemia (08/12/2002) Venous Statis Disease (03/28/2004) Sleep Apnea..2005...study very difficult for him as he is awake routinely at night,clausterphobic,with  suspected social anxiety.He has not kept multiple f/u appts for CPAP titration.Hecontinues to decline treatment 08/2008. Chest Pain - Admit [facility] 05/2008 (r/o for MI; no further w/u pursued; saw cardiology; rec. further eval if pt. has recurrent symptoms; chest CT demonstarted coronary art. disease/calcification)  Past Surgical History: Last updated: 08/31/2006 Sleep Study (03/26/2004) Open MRI-Lumbar  (11/27/2004)  Physical Exam  General:  alert, well-developed, and well-nourished.   Head:  normocephalic and atraumatic.   Neck:  supple.   Lungs:  normal breath sounds.   Heart:  normal rate and regular rhythm.   Abdomen:  soft.   Extremities:  1+ left pedal edema and 1+ right pedal edema.   Neurologic:  alert & oriented X3 and cranial nerves II-XII intact.   Psych:  normally interactive.     Impression & Recommendations:  Problem # 1:  LOW BACK PAIN, CHRONIC (ICD-724.2)  needs to do UDS but states . . . again . . . that he cannot deliver a urine he states he took Lasix this am at 2AM and it has "worn off" notified him that he will not get any refills on oxycontin after today until he delivers a urine for UDS continue current tx if pain changes, will send to pain clinic  His updated medication list for this problem includes:    Nabumetone 500 Mg Tabs (Nabumetone) ..... One by mouth q 12 hours as needed pain    Oxycontin 10 Mg Tb12 (Oxycodone hcl) .Marland Kitchen... Take 1 tablet by mouth every 12 hours for pain  Orders: T-Drug Screen-Urine, (single) [phone])  Problem # 2:  HYPERGLYCEMIA (ICD-790.29)  needs glucose tolerance test will get this done and likely get him in for an appt with dietician first   Orders: T-Glucose Tolerance, 3 hr [id number])  Problem # 3:  HYPERTENSION (ICD-401.9) Assessment: Improved diastolic borderline still not certain of what he is taking asked him to call us and read off his meds so we can get an accurate list I do not think he is taking  Avapro no change in tx at this time  His updated medication list for this problem includes:    Lasix 80 Mg Tabs (Furosemide) ..... One by mouth two times a day    Clonidine Hcl 0.1 Mg Tabs (Clonidine hcl) ..... One tab by mouth two times a day    Avapro 150 Mg Tabs (Irbesartan) ..... One by mouth q am  Problem # 4:  HYPERLIPIDEMIA (ICD-272.4) optimal numbers in 11/2008 LFTs ok in 06/2009  His updated medication list for this problem includes:    Lipitor 20 Mg Tabs (Atorvastatin calcium) ..... One once daily  Problem # 5:  OBESITY, MORBID (ICD-278.01) weight loss has halted will likely send to dietician after  GTT is done and this may help  Problem # 6:  ASTHMA (ICD-493.90) Assessment: Improved if he takes the Advair advised him to get refilled when he runs out  His updated medication list for this problem includes:    Ventolin Hfa 108 (90 Base) Mcg/act Aers (Albuterol sulfate) .Marland Kitchen... 1-2 puffs every 4-6 hours as needed for shortness of breath    Albuterol Sulfate (2.5 Mg/35ml) 0.083% Nebu (Albuterol sulfate) ..... Use  one ampule per nebulizer every 6-8 hours as needed sob    Advair Diskus 250-50 Mcg/dose Misc (Fluticasone-salmeterol) .Marland Kitchen... 1 puff 2 times daily  Problem # 7:  Preventive Health Care (ICD-V70.0) deferred colo last time vacc's up to date PSA done in 11/2008 schedule CPE  Complete Medication List: 1)  Lasix 80 Mg Tabs (Furosemide) .... One by mouth two times a day 2)  Lipitor 20 Mg Tabs (Atorvastatin calcium) .... One once daily 3)  Klor-con M20 20 Meq Tbcr (Potassium chloride crys cr) .... One once daily 4)  Ventolin Hfa 108 (90 Base) Mcg/act Aers (Albuterol sulfate) .Marland Kitchen.. 1-2 puffs every 4-6 hours as needed for shortness of breath 5)  Clonidine Hcl 0.1 Mg Tabs (Clonidine hcl) .... One tab by mouth two times a day 6)  Nabumetone 500 Mg Tabs (Nabumetone) .... One by mouth q 12 hours as needed pain 7)  Avapro 150 Mg Tabs (Irbesartan) .... One by mouth q am 8)   Oxycontin 10 Mg Tb12 (Oxycodone hcl) .... Take 1 tablet by mouth every 12 hours for pain 9)  Albuterol Sulfate (2.5 Mg/63ml) 0.083% Nebu (Albuterol sulfate) .... Use  one ampule per nebulizer every 6-8 hours as needed sob 10)  Nebulizer Misc (Nebulizers) .... One nebulizer with supplies for home use.diagnosis is asthma 11)  Advair Diskus 250-50 Mcg/dose Misc (Fluticasone-salmeterol) .Marland Kitchen.. 1 puff 2 times daily 12)  Allegra 180 Mg Tabs (Fexofenadine hcl) .... Take 1 tablet by mouth once a day as needed for allergies 13)  Flonase 50 Mcg/act Susp (Fluticasone propionate) .... 2 sprays each nostril once daily (use for 3 weeks, then take one week off) use as needed for allergies  Patient Instructions: 1)  Call back today to tell the nurse all the medicines that you still take. 2)  Please schedule a follow-up appointment in 3 months with Alline Pio for CPE. 3)  If you are unable to give a urine sample today, you will need to come to the office one day to leave a specimen before you get any more refills on Oxycontin. 4)    Prescriptions: OXYCONTIN 10 MG  TB12 (OXYCODONE HCL) Take 1 tablet by mouth every 12 hours for pain  #60 x 0   Entered and Authorized by:   Richardson Dopp PA-C   Signed by:   Richardson Dopp PA-C on 10/04/2009   Method used:   Print then Give to Patient   RxID:   [id number]

## 2010-05-25 NOTE — Medication Information (Signed)
Summary: RX Folder//FROM PHARMACY  RX Folder//FROM PHARMACY   Imported By: Roland Earl 08/01/2009 09:05:06  _____________________________________________________________________  External Attachment:    Type:   Image     Comment:   External Document

## 2010-05-25 NOTE — Letter (Signed)
Summary: *HSN Results Follow up  [place], [place] [address]   Phone: [phone]  Fax: [phone]      10/12/2009   CHADWICH MARISCAL [place], [place]  [address]   Dear  Mr. Salathiel Wales,                            ____S.Drinkard,FNP   ____D. Gore,FNP       ____B. McPherson,MD   ____V. Rankins,MD    ____E. Mulberry,MD    ____N. Hassell Done, FNP  ____D. Jobe Igo, MD    ____K. Tomma Lightning, MD    ____Other     This letter is to inform you that your recent test(s):  _______Pap Smear    _______Lab Test     _______X-ray    _______ is within acceptable limits  _______ requires a medication change  _______ requires a follow-up lab visit  _______ requires a follow-up visit with your provider   Comments:  We have been trying to reach you.  Please give the office a call at your earliest convenience.       _________________________________________________________ If you have any questions, please contact our office                     Sincerely,  [place] Shannon [facility]

## 2010-05-25 NOTE — Progress Notes (Signed)
  Phone Note Outgoing Call   Summary of Call: Please get refill hx from his pharmacy. I am worried he is not taking his meds correctly. Initial call taken by: Versie Starks,  July 15, 2009 2:25 PM  Follow-up for Phone Call        called [facility] on [address] and requested hx of refill Follow-up by: [place] Shannon,  July 18, 2009 3:22 PM

## 2010-05-25 NOTE — Letter (Signed)
Summary: Generic Letter  [facility]  [address] [place], [place] [address]   Phone: [phone]  Fax: [phone]    03/24/2007  LAFE MATIAS [id number] [place],   [address]  Dear Mr. KRUPKA,  I recently received your request for medication refills. Unfortunately,the refill requests were denied as you have not been seen here at our office in over 1 year. Our office sent you a letter in October stating that no further refills would be completed until you saw one of the doctors here at [facility]. Medications cannot be filled without routine visits for blood pressure checks and labwork. Please schedule an appointment as soon as possible([phone]). Once,the receptionist knows that you have an appointment with any of the providers then we can refill your medications to the date of your appointment.  If you have decided to see another practice for your medical care, please notify our office about forwarding your records. If you feel that you are physically unable to get into our office then I suggest you call EMS for transport to the nearest hospital facility for evaluation.   Sincerely,   Milagros Evener MD [facility]

## 2010-05-25 NOTE — Letter (Signed)
Summary: [facility]  [facility]   Imported By: Roland Earl 12/16/2008 11:47:19  _____________________________________________________________________  External Attachment:    Type:   Image     Comment:   External Document

## 2010-05-25 NOTE — Letter (Signed)
Summary: *HSN Results Follow up  [place], [place] [address]   Phone: [phone]  Fax: [phone]      12/03/2008   [name] [name], [name]   Dear  [name],                            ____S.Drinkard,FNP   ____D. Gore,FNP       ____B. McPherson,MD   ____V. Rankins,MD    ____E. Mulberry,MD    ____N. Hassell Done, FNP  ____D. Jobe Igo, MD    ____K. Tomma Lightning, MD    __x__S. Kathlen Mody, PA-C     This letter is to inform you that your recent test(s):  _______Pap Smear    ____x___Lab Test     _______X-ray    ___x____ is within acceptable limits  _______ requires a medication change  _______ requires a follow-up lab visit  _______ requires a follow-up visit with [name]   Comments:       _________________________________________________________ If you have any questions, please contact our office                     Sincerely,  Richardson Dopp PA-C [facility]

## 2010-05-25 NOTE — Progress Notes (Signed)
  Phone Note From Pharmacy   Caller: [employer] prescription Mgmt Summary of Call: Denial of coverage for oxycontin--needs preauthorization.  Please call and find out what needed for preauthorization. Initial call taken by: Mack Hook MD,  April 23, 2007 6:29 AM  Follow-up for Phone Call        they faxed the proper form over that needs to be filled out.  It is in Dr. Radene Ou office Follow-up by: Shellia Carwin CMA,  April 23, 2007 4:16 PM  Additional Follow-up for Phone Call Additional follow up Details #1::        tHE LISTED "WORK #" is his neighbor. No answer at # [phone].The [phone] is disconnected. MD has completed new prior authorization(form # [id number]) for brandname CII narcotics. Additional Follow-up by: Milagros Evener MD,  May 02, 2007 4:13 PM

## 2010-05-31 ENCOUNTER — Encounter (INDEPENDENT_AMBULATORY_CARE_PROVIDER_SITE_OTHER): Payer: Self-pay | Admitting: *Deleted

## 2010-05-31 NOTE — Medication Information (Signed)
Summary: MEDICARE PRESCRIPTION DRUG PLAN  MEDICARE PRESCRIPTION DRUG PLAN   Imported By: Roland Earl 05/26/2010 12:00:15  _____________________________________________________________________  External Attachment:    Type:   Image     Comment:   External Document

## 2010-06-03 ENCOUNTER — Telehealth (INDEPENDENT_AMBULATORY_CARE_PROVIDER_SITE_OTHER): Payer: Self-pay | Admitting: Internal Medicine

## 2010-06-08 NOTE — Letter (Signed)
Summary: *HSN Results Follow up  [facility]  [address] [place], [place] [address]   Phone: [phone]  Fax: [phone]      05/31/2010   JATERRION WUNDERLICH [address], [place]  [address]   Dear  Mr. Gemayel Kincade,                            ____S.Drinkard,FNP   ____D. Gore,FNP       ____B. McPherson,MD   ____V. Rankins,MD    ____E. Mulberry,MD    ____N. Hassell Done, FNP  ____D. Jobe Igo, MD    ____K. Tomma Lightning, MD    ____Other     This letter is to inform you that your recent test(s):  _______Pap Smear    _______Lab Test     _______X-ray    _______ is within acceptable limits  _______ requires a medication change  _______ requires a follow-up lab visit  _______ requires a follow-up visit with your provider   Comments:  We have been trying to contact you about the change of your medication.       _________________________________________________________ If you have any questions, please contact our office                     Sincerely,  [place] Shannon [facility]

## 2010-06-14 NOTE — Progress Notes (Signed)
Summary: Ventolin changed to ProAir  Phone Note From Pharmacy   Summary of Call: Just switched HFA to Proair end of January as [employer] Rx PDP did not cover Ventolin.  Now getting a note stating they cover Ventolin and not [employer] do not cover Ventolin--only Proair and xopenex.  will leave on Proair. Initial call taken by: Mack Hook MD,  June 03, 2010 2:44 PM  Follow-up for Phone Call        When I spoke with [facility], they stated that Ventolin had been approved that day or the day before.  But they may have provided the wrong information.  Checked with pharmacy - change in Rx to ProAir received-- pt. picked up ProAir without problem.  Follow-up by: Sherian Maroon RN,  June 05, 2010 5:24 PM

## 2010-07-07 LAB — CBC
HCT: 40.5 % (ref 39.0–52.0)
Hemoglobin: 14.2 g/dL (ref 13.0–17.0)
MCH: 29.2 pg (ref 26.0–34.0)
MCHC: 35.2 g/dL (ref 30.0–36.0)
MCV: 83.1 fL (ref 78.0–100.0)
Platelets: 293 10*3/uL (ref 150–400)
RBC: 4.87 MIL/uL (ref 4.22–5.81)
RDW: 14.3 % (ref 11.5–15.5)
WBC: 10.2 10*3/uL (ref 4.0–10.5)

## 2010-07-07 LAB — URINALYSIS, ROUTINE W REFLEX MICROSCOPIC
Bilirubin Urine: NEGATIVE
Glucose, UA: NEGATIVE mg/dL
Hgb urine dipstick: NEGATIVE
Ketones, ur: NEGATIVE mg/dL
Nitrite: NEGATIVE
Protein, ur: NEGATIVE mg/dL
Specific Gravity, Urine: 1.009 (ref 1.005–1.030)
Urobilinogen, UA: 0.2 mg/dL (ref 0.0–1.0)
pH: 6 (ref 5.0–8.0)

## 2010-07-07 LAB — COMPREHENSIVE METABOLIC PANEL
ALT: 60 U/L — ABNORMAL HIGH (ref 0–53)
AST: 39 U/L — ABNORMAL HIGH (ref 0–37)
Albumin: 3.4 g/dL — ABNORMAL LOW (ref 3.5–5.2)
Alkaline Phosphatase: 108 U/L (ref 39–117)
BUN: 14 mg/dL (ref 6–23)
CO2: 25 mEq/L (ref 19–32)
Calcium: 9.1 mg/dL (ref 8.4–10.5)
Chloride: 105 mEq/L (ref 96–112)
Creatinine, Ser: 1 mg/dL (ref 0.4–1.5)
GFR calc Af Amer: >60 mL/min (ref >60)
GFR calc non Af Amer: >60 mL/min (ref >60)
Glucose, Bld: 118 mg/dL — ABNORMAL HIGH (ref 70–99)
Potassium: 4.2 mEq/L (ref 3.5–5.1)
Sodium: 138 mEq/L (ref 135–145)
Total Bilirubin: 0.7 mg/dL (ref 0.3–1.2)
Total Protein: 7.2 g/dL (ref 6.0–8.3)

## 2010-07-07 LAB — TYPE AND SCREEN
ABO/RH(D): B POS
Antibody Screen: NEGATIVE

## 2010-07-07 LAB — DIFFERENTIAL
Basophils Absolute: 0.1 10*3/uL (ref 0.0–0.1)
Basophils Relative: 1 % (ref 0–1)
Eosinophils Absolute: 0.1 10*3/uL (ref 0.0–0.7)
Eosinophils Relative: 1 % (ref 0–5)
Lymphocytes Relative: 17 % (ref 12–46)
Lymphs Abs: 1.8 10*3/uL (ref 0.7–4.0)
Monocytes Absolute: 0.8 10*3/uL (ref 0.1–1.0)
Monocytes Relative: 7 % (ref 3–12)
Neutro Abs: 7.5 10*3/uL (ref 1.7–7.7)
Neutrophils Relative %: 73 % (ref 43–77)

## 2010-07-07 LAB — HEMOCCULT GUIAC POC 1CARD (OFFICE): Fecal Occult Bld: POSITIVE

## 2010-07-07 LAB — ABO/RH: ABO/RH(D): B POS

## 2010-08-08 LAB — BASIC METABOLIC PANEL
BUN: 14 mg/dL (ref 6–23)
BUN: 9 mg/dL (ref 6–23)
CO2: 24 mEq/L (ref 19–32)
CO2: 26 mEq/L (ref 19–32)
Calcium: 8.5 mg/dL (ref 8.4–10.5)
Calcium: 8.5 mg/dL (ref 8.4–10.5)
Chloride: 100 mEq/L (ref 96–112)
Chloride: 101 mEq/L (ref 96–112)
Creatinine, Ser: 1.06 mg/dL (ref 0.4–1.5)
Creatinine, Ser: 1.17 mg/dL (ref 0.4–1.5)
GFR calc Af Amer: >60 mL/min (ref >60)
GFR calc Af Amer: >60 mL/min (ref >60)
GFR calc non Af Amer: >60 mL/min (ref >60)
GFR calc non Af Amer: >60 mL/min (ref >60)
Glucose, Bld: 105 mg/dL — ABNORMAL HIGH (ref 70–99)
Glucose, Bld: 138 mg/dL — ABNORMAL HIGH (ref 70–99)
Potassium: 3.7 mEq/L (ref 3.5–5.1)
Potassium: 4 mEq/L (ref 3.5–5.1)
Sodium: 135 mEq/L (ref 135–145)
Sodium: 137 mEq/L (ref 135–145)

## 2010-08-08 LAB — CBC
HCT: 33.7 % — ABNORMAL LOW (ref 39.0–52.0)
HCT: 36.4 % — ABNORMAL LOW (ref 39.0–52.0)
Hemoglobin: 11.9 g/dL — ABNORMAL LOW (ref 13.0–17.0)
Hemoglobin: 12.9 g/dL — ABNORMAL LOW (ref 13.0–17.0)
MCHC: 35.2 g/dL (ref 30.0–36.0)
MCHC: 35.5 g/dL (ref 30.0–36.0)
MCV: 83.9 fL (ref 78.0–100.0)
MCV: 85.2 fL (ref 78.0–100.0)
Platelets: 290 10*3/uL (ref 150–400)
Platelets: 327 10*3/uL (ref 150–400)
RBC: 3.96 MIL/uL — ABNORMAL LOW (ref 4.22–5.81)
RBC: 4.34 MIL/uL (ref 4.22–5.81)
RDW: 13.7 % (ref 11.5–15.5)
RDW: 13.9 % (ref 11.5–15.5)
WBC: 10.9 10*3/uL — ABNORMAL HIGH (ref 4.0–10.5)
WBC: 7.9 10*3/uL (ref 4.0–10.5)

## 2010-08-08 LAB — CARDIAC PANEL(CRET KIN+CKTOT+MB+TROPI)
CK, MB: 1 ng/mL (ref 0.3–4.0)
CK, MB: 1.2 ng/mL (ref 0.3–4.0)
Relative Index: 0.8 (ref 0.0–2.5)
Relative Index: 1.1 (ref 0.0–2.5)
Total CK: 108 U/L (ref 7–232)
Total CK: 132 U/L (ref 7–232)
Troponin I: <0.01 ng/mL (ref 0.00–0.06)
Troponin I: <0.01 ng/mL (ref 0.00–0.06)

## 2010-08-08 LAB — POCT CARDIAC MARKERS
CKMB, poc: <1 ng/mL — ABNORMAL LOW (ref 1.0–8.0)
Myoglobin, poc: 149 ng/mL (ref 12–200)
Troponin i, poc: <0.05 ng/mL (ref 0.00–0.09)

## 2010-08-08 LAB — LIPID PANEL
Cholesterol: 129 mg/dL (ref 0–200)
HDL: 46 mg/dL (ref >39)
LDL Cholesterol: 69 mg/dL (ref 0–99)
Total CHOL/HDL Ratio: 2.8 RATIO
Triglycerides: 71 mg/dL (ref <150)
VLDL: 14 mg/dL (ref 0–40)

## 2010-08-08 LAB — BRAIN NATRIURETIC PEPTIDE: Pro B Natriuretic peptide (BNP): 70 pg/mL (ref 0.0–100.0)

## 2010-08-08 LAB — HEMOGLOBIN A1C
Hgb A1c MFr Bld: 6.1 % (ref 4.6–6.1)
Mean Plasma Glucose: 128 mg/dL

## 2010-08-08 LAB — D-DIMER, QUANTITATIVE: D-Dimer, Quant: 1.54 ug/mL-FEU — ABNORMAL HIGH (ref 0.00–0.48)

## 2010-09-05 NOTE — Discharge Summary (Signed)
NAMEBRYSHERE, Brian Silva NO.:  [id number]   MEDICAL RECORD NO.:  [id number]          PATIENT TYPE:  INP   LOCATION:  2007                         FACILITY:  [facility]   PHYSICIAN:  Brian Leep, MD     DATE OF BIRTH:  07/11/1940   DATE OF ADMISSION:  06/05/2008  DATE OF DISCHARGE:  06/06/2008                               DISCHARGE SUMMARY   PRIORITY DISCHARGE SUMMARY   PRIMARY MEDICAL DOCTOR:  Brian R. Rankins, MD, at [facility].   DISCHARGE DIAGNOSES:  1. Atypical chest pain.  Resolved.  2. Hypertension.  3. Hyperlipidemia.  4. Morbid obesity.  5. Obstructive sleep apnea syndrome, for outpatient evaluation.  6. Anemia, for outpatient evaluation.  7. Asthma.   DISCHARGE MEDICATIONS:  Unchanged from prior to admission, which  include:  1. Clonidine 0.1 mg p.o. b.i.d.  2. Lipitor 20 mg p.o. daily.  3. Fexofenadine 60/120, one tablet q.12 hourly.  4. Primatene Mist p.r.n.  5. Nabumetone 500 mg p.o. b.i.d. p.r.n.  6. Lasix 1 tablet (dose unknown) p.o. b.i.d.  7. Aspirin 81 mg p.o. daily.  8. Albuterol nebulizations p.r.n.   PROCEDURES:  1. A CT angio of the chest PE protocol.  Impression:  Mild bibasilar      atelectasis, otherwise no evidence of acute abnormality.  No      evidence of pulmonary emboli.  Coronary artery disease.  2. Chest x-ray on the 13th of February is cardiomegaly, vascular      congestion.   PERTINENT LABORATORY DATA:  Hemoglobin A1c 6.1.  Lipid panel with  cholesterol 129, triglycerides 71, HDL 46, LDL 69, VLDL 14.  Cardiac  panel cycled and negative.  A basic metabolic panel today unremarkable  with BUN 9, creatinine 1.06.  CBC is with hemoglobin 11.9, hematocrit  33.7, white blood cells 7.9, platelets 290.  MCV 85.2.  D-dimer 1.54.  BNP 70.   CONSULTATIONS:  Cardiology, Brian Bjornstad, MD.   HOSPITAL COURSE AND PATIENT DISPOSITION:  [name] is a pleasant, 70-  year-old, Caucasian male patient with history of morbid  obesity,  hypertension, hyperlipidemia, asthma.  He claims he also has obstructive  sleep apnea for which he had a sleep study done, which apparently was  incomplete.  He, however, refuses to go back to have the sleep study  repeated because of that experience.  In any event, he woke up on the  afternoon prior to admission with left-sided atypical chest pain.  The  patient indicates pain in the left upper lateral anterior chest  underneath the left lateral clavicle and also pain symmetrically on the  back.  This apparently woke him up from sleep.  He initially indicated  that this was worse with exertion and was associated with dyspnea, but  subsequently denied those symptoms.  He denied any worsening of pain on  movement of the left shoulder.  He was admitted to the hospital on  telemetry for further evaluation and management.   1. Atypical chest pain.  The patient's cardiac enzymes were cycled and      negative.  A D-dimer was positive,  which was followed with a CT      angiogram of the chest, and pulmonary embolism or other acute      pathology was ruled out. Given his risk factors of morbid obesity,      hyperlipidemia, hypertension, and obstructive sleep apnea syndrome,      to ensure that a cardiac etiology was not missed, Cardiology was      consulted.  They, too, saw him and thought that this was more a      musculoskeletal-type of pain arising from the left shoulder girdle.      They recommended no cardiac workup and cleared him for discharge      home.  However, if patient continues to have recurrent pains then      they recommend further cardiac evaluation.  The patient has been      chest pain free since yesterday morning.  2. Hypertension - fairly controlled on current medications.  3. Hyperlipidemia well-controlled on the Lipitor.  4. Morbid obesity.  5. Obstructive sleep apnea syndrome.  I have discussed in detail with      the patient about the risks of untreated  obstructive sleep apnea on      the cardiopulmonary system and overall morbidity and mortality.  He      indicates he will think it over about getting a repeat testing      done.  6. Anemia.  Consider outpatient evaluation as deemed necessary.  7. Asthma.  Patient indicates that he uses Albuterol nebulizers, which      he has at home on a p.r.n. basis, which is infrequently and      sometimes when he runs out of the nebulizers, he uses the [employer].  The patient is not on home oxygen or nightly CPAP.   The patient at this time is stable for discharge home to follow up with  his primary medical doctor.      Brian Leep, MD  Electronically Signed     AH/MEDQ  D:  06/06/2008  T:  06/06/2008  Job:  [id number]   cc:   Brian Silva. Silva, M.D.  Brian Bjornstad, MD, [facility]

## 2010-09-05 NOTE — H&P (Signed)
Brian Silva, Brian Silva NO.:  [id number]   MEDICAL RECORD NO.:  [id number]          PATIENT TYPE:  INP   LOCATION:  1823                         FACILITY:  [facility]   PHYSICIAN:  Jana Hakim, M.D. DATE OF BIRTH:  [birth year]   DATE OF ADMISSION:  06/05/2008  DATE OF DISCHARGE:                              HISTORY & PHYSICAL   PRIMARY CARE PHYSICIAN:  [facility], Dr. Zadie Rhine.   CHIEF COMPLAINTS:  Chest pain.   HISTORY OF PRESENT ILLNESS:  This is a 70 year old morbidly obese male  who presents to the emergency department with complaints of severe  substernal chest pain which radiates into the left shoulder and left  upper arm.  He states this discomfort began at about 3 p.m.  He states  the discomfort worsens with movement or exertion.  He reports having  associated symptoms of shortness of breath.  Denies having any nausea or  vomiting or diaphoresis.  The patient denies having any fevers, chills,  congestion, dysuria, bowel changes, lightheadedness, syncope or seizure  activity.   PAST MEDICAL HISTORY:  Significant for hypertension, hyperlipidemia,  asthma and morbid obesity.   PAST SURGICAL HISTORY:  History of surgery to a right clavicle fracture  repair.   MEDICATIONS:  Include Avapro, clonidine, furosemide, Lipitor, potassium  chloride, Allegra and generic Relafen.   ALLERGIES:  No known drug allergies.   SOCIAL HISTORY:  The patient is a nonsmoker and nondrinker.   FAMILY HISTORY:  Is noncontributory.   REVIEW OF SYSTEMS:  Pertinents are mentioned above.   PHYSICAL EXAMINATION:  GENERAL:  This is a 70 year old morbidly obese  male in discomfort but no acute distress.  VITAL SIGNS:  Temperature 98.4, blood pressure 138/65, heart rate from  85-86, respirations 21-18 and oxygen level is 97%.  HEENT:  Normocephalic, atraumatic.  There is no scleral icterus.  Pupils  are equally round and reactive to light.  Extraocular movements are  intact.   Funduscopic benign.  Nares are patent bilaterally.  Oropharynx  is clear.  NECK:  Supple, full range of motion.  No thyromegaly, adenopathy or  jugular venous distention.  CARDIOVASCULAR:  Regular rate and rhythm.  No murmurs, gallops or rubs.  LUNGS:  Clear to auscultation bilaterally.  ABDOMEN:  Obese, soft, nontender, nondistended.  No hepatosplenomegaly  detected.  EXTREMITIES:  With trace edema.  No cyanosis, clubbing.  NEUROLOGIC:  The patient is alert and oriented x3.  There are no focal  deficits on examination.   LABORATORY STUDIES:  White blood cell count 10.9, hemoglobin 12.9,  hematocrit 36.4, platelets 327, MCV 83.9, sodium 135, potassium 4.0,  chloride 100, CO2 26, BUN 14, creatinine 1.17 and glucose 138.  Beta  natriuretic peptide 70.0, point of care cardiac markers with a myoglobin  of 149, a CK-MB of less than 1.0 and a troponin of less than 0.05.  Chest x-ray reveals cardiomegaly and vascular congestion.   ASSESSMENT:  A 70 year old male being admitted with:  1. Chest pain.  2. Hypertension.  3. Hyperlipidemia.  4. Asthma.  5. Mild anemia.  6. Morbid obesity.   PLAN:  The patient  will be admitted to telemetry area for further  evaluation.  Cardiac enzymes will be performed and based on the results  the patient will either be referred for an inpatient versus an  outpatient stress test.  The patient will be placed on nitro paste, O2  and aspirin therapy.      Jana Hakim, M.D.  Electronically Signed     HJ/MEDQ  D:  06/05/2008  T:  06/05/2008  Job:  [id number]

## 2010-09-05 NOTE — H&P (Signed)
NAMEMARCIN, MCELHENNEY NO.:  [id number]   MEDICAL RECORD NO.:  [id number]          PATIENT TYPE:  INP   LOCATION:                               FACILITY:  [facility]   PHYSICIAN:  Carlena Bjornstad, MD, FACCDATE OF BIRTH:  09-Dec-1940   DATE OF ADMISSION:  06/05/2008  DATE OF DISCHARGE:  06/06/2008                              HISTORY & PHYSICAL   REFERRING PHYSICIAN:  Vernell Leep, MD, [facility].   REASON FOR CONSULTATION:  Left shoulder discomfort.   Mr Ann is an unfortunate 70 year old male, with no documented  history of coronary artery disease, and with cardiac risk factors  notable for hypertension, dyslipidemia, and age.  He lives alone, is  unemployed, and is followed by Dr. Zadie Rhine of [facility].  The patient  presented to the emergency room, via EMS, with complains of severe left  shoulder discomfort, which was quite focal, and which was exacerbated by  movement of the left shoulder as well as deep inspiration.  This lasted  several hours and, in fact, did not abate until he was treated with IV  Dilaudid in the emergency room.  Of note, he did not report any relief  with sublingual nitroglycerin.   Serial cardiac markers have all been within normal limits.  A D-dimer  was drawn and elevated, but a followup CT angiogram of the chest was  negative for pulmonary emboli, with mild bibasilar atelectasis.  Chest x-  ray did suggest cardiomegaly with vascular congestion.  Admission EKG  suggests normal sinus rhythm at 83 bpm with left axis deviation and poor  R-wave progression.   Mr. Lenzi presents with no antecedent history of exertional angina  pectoris.  He is morbidly obese and is limited in his mobility.  He does  report some chronic exertional dyspnea.  He denied any anterior chest  discomfort with this episode of shoulder pain, and has not had any chest  pain since admission.  Moreover, he has not had any recurrent shoulder  pain, as  well.   ALLERGIES:  No known drug allergies.   HOME MEDICATIONS:  1. Aspirin 81 daily.  2. Clonidine 0.1 mg b.i.d.  3. Lipitor 20 daily,  4. Nabumetone 500 mg b.i.d. p.r.n.  5. Primatene Mist.  6. Fexofenadine 60/120 mg b.i.d.   PAST MEDICAL HISTORY:  1. Hypertension.  2. Dyslipidemia.  3. Morbid obesity.  4. History of moderate obstructive sleep apnea.  5. History of asthma.   SURGICAL HISTORY:  Status post right clavicular fracture repair.   SOCIAL HISTORY:  The patient lives alone here in [place].  He is  unemployed.  He has 1 grown child.  He has never smoked tobacco.  Drinks  a beer only on rare occasion.   FAMILY HISTORY:  Father deceased at age 25, fatal myocardial function.  No known prior history of heart disease.   REVIEW OF SYSTEMS:  Denies history of diabetes mellitus.  Denies any  history of exertional angina pectoris.  Denies orthopnea or PND, but  does have chronic lower extremity edema.  Denies any symptoms suggestive  of active  reflux disease.  Otherwise as noted per HPI, all other systems  are negative.   PHYSICAL EXAMINATION:  VITAL SIGNS:  Blood pressure currently 133/72,  pulse 78 and regular, respirations 22, temperature afebrile, sats 99% on  2 L.  GENERAL:  70 year old male, morbidly obese, sitting upright, in no  distress.  HEENT: Normocephalic, atraumatic.  PERRLA.  EOMI.  NECK:  Palpable bilateral carotid pulses without bruits; unable to  assess JVD, secondary to neck girth.  LUNGS:  Diminished breath sounds at the bases, but without crackles or  wheezes.  HEART:  Regular rate and rhythm, with diminished heart sounds.  No  significant murmurs.  No rubs.  ABDOMEN:  Protuberant, intact bowel sounds.  EXTREMITIES:  Nonpalpable dorsalis pedis pulses with 3+ bilateral  nonpitting edema.  SKIN:  No obvious rash or lesions.  MUSCULOSKELETAL:  No gross deformity.  NEUROLOGIC:  Alert and oriented.   ADMISSION CHEST X-RAY:  Cardiomegaly;  vascular congestion.   CT ANGIOGRAM OF THE CHEST:  Negative for pulmonary emboli; mild  bibasilar atelectasis; coronary artery disease.   ADMISSION EKG:  Normal sinus rhythm at 83 bpm with LAD; poor R-wave  progression with a question of prior old IMI.  Comparison with the  previous study in 2007 shows no significant change.  There is question  regarding the discrepancy in current EKG based on lead placement.   LABORATORY DATA:  Normal CPK/MBs and troponins.  D-dimer 1.54.  BMP 70.  Sodium 137, potassium 3.7, BUN 9, creatinine 1.1, and glucose 105.  WBC  7.9, hemoglobin 11.9, hematocrit 33.7, and platelets 290.  Total  cholesterol 129, HDL 46, and LDL 69.   IMPRESSION:  1. Left shoulder pain.      a.     Doubt ischemic etiology, most likely musculoskeletal.      b.     Normal cardiac markers.      c.     Relived with Dilaudid.      d.     Exacerbated by movement.  2. Hypertension.  3. Dyslipidemia, well controlled.  4. Morbid obesity.   PLAN:  No further workup is indicated at this point in time.  The  patient has not had any recurrent shoulder discomfort, which was clearly  exacerbated by movement, and which was relieved by Dilaudid.  He has not  had any anterior chest discomfort, and serial cardiac markers have been  negative.  EKG does not show any acute changes.  Per the indication of  coronary artery disease by CT angiography was noted.  Recommendation,  however, is to continue aggressive primary prevention and the patient  should continue on his home regimen of low-dose aspirin, statin, and  antihypertensive medication.  The patient should also continue regular  followup with his primary care physician, Dr. Zadie Rhine, at [facility].      Gene Serpe, PA-C      Carlena Bjornstad, MD, [facility]  Electronically Signed    GS/MEDQ  D:  06/06/2008  T:  06/07/2008  Job:  [id number]   cc:   Dominica Severin A. Rankin, M.D.

## 2010-09-08 NOTE — Procedures (Signed)
[name], Brian Silva NO.:  [id number]   MEDICAL RECORD NO.:  [id number]          PATIENT TYPE:  OUT   LOCATION:  SLEEP CENTER                 FACILITY:  [facility]   PHYSICIAN:  Clinton D. Annamaria Boots, M.D. DATE OF BIRTH:  Jan 31, 1941   DATE OF STUDY:  03/26/2004                              NOCTURNAL POLYSOMNOGRAM   STUDY DATE:  03/26/04   REFERRING PHYSICIAN:  Santiago Glad L. Darron Doom, M.D.   INDICATION FOR STUDY:  Hypersomnia with sleep apnea.   EPWORTH SLEEPINESS SCORE:  5/24   BMI:  55.4   WEIGHT:  410 pounds   SLEEP ARCHITECTURE:  Total sleep time 236 minutes with sleep efficiency 52%.  Stage I was 6%, stage II 62%, stages III and IV 24%.  REM was 8% of total  sleep time.  Sleep latency 62 minutes.  REM latency 335 minutes.  Awake  after sleep onset 164 minutes.  Arousal index 22.4.   RESPIRATORY DATA:  RDI 15.3 per hour indicating mild to moderate obstructive  sleep apnea/hypopnea syndrome and reflecting a total of 1 central apnea, 2  obstructive apneas and 57 hypopnea's.  Events were not positional.  REM RDI  18.9 per hour.  The technician was unable to follow split study CPAP  titration protocol because of insufficient early events to allow time for  CPAP titration.   OXYGEN DATA:  Mild snoring with oxygen desaturation to a nadir of 79%.  Mean  oxygen saturation through the study was 89 to 90% on room air.   CARDIAC DATA:  Normal sinus rhythm with occasional PVC.   MOVEMENTS/PARASOMNIA:  A total of 19 limb jerks were recorded with  insignificant impact on sleep.   IMPRESSION/RECOMMENDATION:  Mild to moderate obstructive sleep  apnea/hypopnea syndrome, RDI 15.3 per hour with desaturation to 79%.  Consider return for CPAP titration or evaluate for alternative therapies, as  appropriate.                                                           Clinton D. Annamaria Boots, M.D.  Diplomate, [employer] Board   CDY/MEDQ  D:  04/02/2004 11:22:33  T:  04/02/2004 18:38:36  Job:   [id number]

## 2010-09-17 ENCOUNTER — Emergency Department (HOSPITAL_COMMUNITY): Payer: Medicare Other

## 2010-09-17 ENCOUNTER — Emergency Department (HOSPITAL_COMMUNITY)
Admission: EM | Admit: 2010-09-17 | Discharge: 2010-09-17 | Disposition: A | Discharge status: Home / Self Care | Payer: Medicare Other | Attending: Emergency Medicine | Admitting: Emergency Medicine

## 2010-09-17 DIAGNOSIS — E785 Hyperlipidemia, unspecified: Secondary | ICD-10-CM | POA: Insufficient documentation

## 2010-09-17 DIAGNOSIS — I10 Essential (primary) hypertension: Secondary | ICD-10-CM | POA: Insufficient documentation

## 2010-09-17 DIAGNOSIS — J029 Acute pharyngitis, unspecified: Secondary | ICD-10-CM | POA: Insufficient documentation

## 2010-09-17 DIAGNOSIS — J45909 Unspecified asthma, uncomplicated: Secondary | ICD-10-CM | POA: Insufficient documentation

## 2010-09-17 DIAGNOSIS — J309 Allergic rhinitis, unspecified: Secondary | ICD-10-CM | POA: Insufficient documentation

## 2010-09-17 DIAGNOSIS — R0982 Postnasal drip: Secondary | ICD-10-CM | POA: Insufficient documentation

## 2010-11-07 ENCOUNTER — Emergency Department (HOSPITAL_COMMUNITY)
Admission: EM | Admit: 2010-11-07 | Discharge: 2010-11-08 | Disposition: A | Discharge status: Home / Self Care | Payer: Medicare Other | Attending: Emergency Medicine | Admitting: Emergency Medicine

## 2010-11-07 DIAGNOSIS — R42 Dizziness and giddiness: Secondary | ICD-10-CM | POA: Insufficient documentation

## 2010-11-07 DIAGNOSIS — I498 Other specified cardiac arrhythmias: Secondary | ICD-10-CM | POA: Insufficient documentation

## 2010-11-07 DIAGNOSIS — E785 Hyperlipidemia, unspecified: Secondary | ICD-10-CM | POA: Insufficient documentation

## 2010-11-07 DIAGNOSIS — R0602 Shortness of breath: Secondary | ICD-10-CM | POA: Insufficient documentation

## 2010-11-07 DIAGNOSIS — J45909 Unspecified asthma, uncomplicated: Secondary | ICD-10-CM | POA: Insufficient documentation

## 2010-11-07 DIAGNOSIS — Z79899 Other long term (current) drug therapy: Secondary | ICD-10-CM | POA: Insufficient documentation

## 2010-11-07 DIAGNOSIS — I10 Essential (primary) hypertension: Secondary | ICD-10-CM | POA: Insufficient documentation

## 2010-11-08 ENCOUNTER — Emergency Department (HOSPITAL_COMMUNITY): Payer: Medicare Other

## 2010-11-08 LAB — COMPREHENSIVE METABOLIC PANEL
ALT: 61 U/L — ABNORMAL HIGH (ref 0–53)
AST: 39 U/L — ABNORMAL HIGH (ref 0–37)
Albumin: 3.5 g/dL (ref 3.5–5.2)
Alkaline Phosphatase: 107 U/L (ref 39–117)
BUN: 17 mg/dL (ref 6–23)
CO2: 26 mEq/L (ref 19–32)
Calcium: 9.4 mg/dL (ref 8.4–10.5)
Chloride: 96 mEq/L (ref 96–112)
Creatinine, Ser: 1.14 mg/dL (ref 0.50–1.35)
GFR calc Af Amer: >60 mL/min (ref >60)
GFR calc non Af Amer: >60 mL/min (ref >60)
Glucose, Bld: 162 mg/dL — ABNORMAL HIGH (ref 70–99)
Potassium: 4.3 mEq/L (ref 3.5–5.1)
Sodium: 132 mEq/L — ABNORMAL LOW (ref 135–145)
Total Bilirubin: 0.4 mg/dL (ref 0.3–1.2)
Total Protein: 7.5 g/dL (ref 6.0–8.3)

## 2010-11-08 LAB — TROPONIN I: Troponin I: <0.3 ng/mL (ref <0.30)

## 2010-11-08 LAB — CBC
HCT: 41.1 % (ref 39.0–52.0)
Hemoglobin: 13.5 g/dL (ref 13.0–17.0)
MCH: 28 pg (ref 26.0–34.0)
MCHC: 32.8 g/dL (ref 30.0–36.0)
MCV: 85.3 fL (ref 78.0–100.0)
Platelets: 299 10*3/uL (ref 150–400)
RBC: 4.82 MIL/uL (ref 4.22–5.81)
RDW: 14.3 % (ref 11.5–15.5)
WBC: 10.7 10*3/uL — ABNORMAL HIGH (ref 4.0–10.5)

## 2010-11-08 LAB — URINALYSIS, ROUTINE W REFLEX MICROSCOPIC
Bilirubin Urine: NEGATIVE
Glucose, UA: NEGATIVE mg/dL
Hgb urine dipstick: NEGATIVE
Ketones, ur: NEGATIVE mg/dL
Leukocytes, UA: NEGATIVE
Nitrite: NEGATIVE
Protein, ur: NEGATIVE mg/dL
Specific Gravity, Urine: 1.004 — ABNORMAL LOW (ref 1.005–1.030)
Urobilinogen, UA: 0.2 mg/dL (ref 0.0–1.0)
pH: 6.5 (ref 5.0–8.0)

## 2010-11-08 LAB — DIFFERENTIAL
Basophils Absolute: 0.1 10*3/uL (ref 0.0–0.1)
Basophils Relative: 1 % (ref 0–1)
Eosinophils Absolute: 0.1 10*3/uL (ref 0.0–0.7)
Eosinophils Relative: 1 % (ref 0–5)
Lymphocytes Relative: 15 % (ref 12–46)
Lymphs Abs: 1.6 10*3/uL (ref 0.7–4.0)
Monocytes Absolute: 0.9 10*3/uL (ref 0.1–1.0)
Monocytes Relative: 8 % (ref 3–12)
Neutro Abs: 8.1 10*3/uL — ABNORMAL HIGH (ref 1.7–7.7)
Neutrophils Relative %: 76 % (ref 43–77)

## 2010-11-08 LAB — PRO B NATRIURETIC PEPTIDE: Pro B Natriuretic peptide (BNP): 96.2 pg/mL (ref 0–125)

## 2010-11-08 LAB — CK TOTAL AND CKMB (NOT AT ARMC)
CK, MB: 1.7 ng/mL (ref 0.3–4.0)
Relative Index: INVALID (ref 0.0–2.5)
Total CK: 80 U/L (ref 7–232)

## 2010-11-11 ENCOUNTER — Emergency Department (HOSPITAL_COMMUNITY)
Admission: EM | Admit: 2010-11-11 | Discharge: 2010-11-11 | Disposition: A | Discharge status: Home / Self Care | Payer: Medicare Other | Attending: Emergency Medicine | Admitting: Emergency Medicine

## 2010-11-11 DIAGNOSIS — J45909 Unspecified asthma, uncomplicated: Secondary | ICD-10-CM | POA: Insufficient documentation

## 2010-11-11 DIAGNOSIS — E785 Hyperlipidemia, unspecified: Secondary | ICD-10-CM | POA: Insufficient documentation

## 2010-11-11 DIAGNOSIS — R002 Palpitations: Secondary | ICD-10-CM | POA: Insufficient documentation

## 2010-11-11 DIAGNOSIS — F411 Generalized anxiety disorder: Secondary | ICD-10-CM | POA: Insufficient documentation

## 2010-11-11 DIAGNOSIS — I10 Essential (primary) hypertension: Secondary | ICD-10-CM | POA: Insufficient documentation

## 2010-11-11 DIAGNOSIS — R42 Dizziness and giddiness: Secondary | ICD-10-CM | POA: Insufficient documentation

## 2010-11-13 ENCOUNTER — Emergency Department (HOSPITAL_COMMUNITY)
Admission: EM | Admit: 2010-11-13 | Discharge: 2010-11-13 | Disposition: A | Discharge status: Home / Self Care | Payer: Medicare Other | Attending: Emergency Medicine | Admitting: Emergency Medicine

## 2010-11-13 DIAGNOSIS — J45909 Unspecified asthma, uncomplicated: Secondary | ICD-10-CM | POA: Insufficient documentation

## 2010-11-13 DIAGNOSIS — E869 Volume depletion, unspecified: Secondary | ICD-10-CM | POA: Insufficient documentation

## 2010-11-13 DIAGNOSIS — I10 Essential (primary) hypertension: Secondary | ICD-10-CM | POA: Insufficient documentation

## 2010-11-13 DIAGNOSIS — Z79899 Other long term (current) drug therapy: Secondary | ICD-10-CM | POA: Insufficient documentation

## 2010-11-13 DIAGNOSIS — R42 Dizziness and giddiness: Secondary | ICD-10-CM | POA: Insufficient documentation

## 2010-11-13 DIAGNOSIS — E785 Hyperlipidemia, unspecified: Secondary | ICD-10-CM | POA: Insufficient documentation

## 2010-11-13 LAB — DIFFERENTIAL
Basophils Absolute: 0.1 10*3/uL (ref 0.0–0.1)
Basophils Relative: 1 % (ref 0–1)
Eosinophils Absolute: 0.1 10*3/uL (ref 0.0–0.7)
Eosinophils Relative: 1 % (ref 0–5)
Lymphocytes Relative: 23 % (ref 12–46)
Lymphs Abs: 2.8 10*3/uL (ref 0.7–4.0)
Monocytes Absolute: 1 10*3/uL (ref 0.1–1.0)
Monocytes Relative: 8 % (ref 3–12)
Neutro Abs: 8.2 10*3/uL — ABNORMAL HIGH (ref 1.7–7.7)
Neutrophils Relative %: 67 % (ref 43–77)

## 2010-11-13 LAB — CBC
HCT: 41.6 % (ref 39.0–52.0)
Hemoglobin: 13.7 g/dL (ref 13.0–17.0)
MCH: 28.1 pg (ref 26.0–34.0)
MCHC: 32.9 g/dL (ref 30.0–36.0)
MCV: 85.2 fL (ref 78.0–100.0)
Platelets: 307 10*3/uL (ref 150–400)
RBC: 4.88 MIL/uL (ref 4.22–5.81)
RDW: 14.1 % (ref 11.5–15.5)
WBC: 12.2 10*3/uL — ABNORMAL HIGH (ref 4.0–10.5)

## 2010-11-13 LAB — BASIC METABOLIC PANEL
BUN: 24 mg/dL — ABNORMAL HIGH (ref 6–23)
CO2: 28 mEq/L (ref 19–32)
Calcium: 9.3 mg/dL (ref 8.4–10.5)
Chloride: 97 mEq/L (ref 96–112)
Creatinine, Ser: 1.24 mg/dL (ref 0.50–1.35)
GFR calc Af Amer: >60 mL/min (ref >60)
GFR calc non Af Amer: 58 mL/min — ABNORMAL LOW (ref >60)
Glucose, Bld: 127 mg/dL — ABNORMAL HIGH (ref 70–99)
Potassium: 4.5 mEq/L (ref 3.5–5.1)
Sodium: 133 mEq/L — ABNORMAL LOW (ref 135–145)

## 2010-11-13 LAB — TROPONIN I: Troponin I: <0.3 ng/mL (ref <0.30)

## 2010-12-29 ENCOUNTER — Emergency Department (HOSPITAL_COMMUNITY): Payer: Medicare Other

## 2010-12-29 ENCOUNTER — Emergency Department (HOSPITAL_COMMUNITY)
Admission: EM | Admit: 2010-12-29 | Discharge: 2010-12-29 | Disposition: A | Discharge status: Home / Self Care | Payer: Medicare Other | Attending: Emergency Medicine | Admitting: Emergency Medicine

## 2010-12-29 DIAGNOSIS — R0602 Shortness of breath: Secondary | ICD-10-CM | POA: Insufficient documentation

## 2010-12-29 DIAGNOSIS — F411 Generalized anxiety disorder: Secondary | ICD-10-CM | POA: Insufficient documentation

## 2010-12-29 DIAGNOSIS — I10 Essential (primary) hypertension: Secondary | ICD-10-CM | POA: Insufficient documentation

## 2010-12-29 DIAGNOSIS — E785 Hyperlipidemia, unspecified: Secondary | ICD-10-CM | POA: Insufficient documentation

## 2010-12-29 DIAGNOSIS — J45909 Unspecified asthma, uncomplicated: Secondary | ICD-10-CM | POA: Insufficient documentation

## 2010-12-29 LAB — COMPREHENSIVE METABOLIC PANEL
ALT: 41 U/L (ref 0–53)
AST: 27 U/L (ref 0–37)
Albumin: 3.2 g/dL — ABNORMAL LOW (ref 3.5–5.2)
Alkaline Phosphatase: 111 U/L (ref 39–117)
BUN: 14 mg/dL (ref 6–23)
CO2: 25 mEq/L (ref 19–32)
Calcium: 9.1 mg/dL (ref 8.4–10.5)
Chloride: 101 mEq/L (ref 96–112)
Creatinine, Ser: 1.04 mg/dL (ref 0.50–1.35)
GFR calc Af Amer: >60 mL/min (ref >60)
GFR calc non Af Amer: >60 mL/min (ref >60)
Glucose, Bld: 195 mg/dL — ABNORMAL HIGH (ref 70–99)
Potassium: 4.1 mEq/L (ref 3.5–5.1)
Sodium: 137 mEq/L (ref 135–145)
Total Bilirubin: 0.2 mg/dL — ABNORMAL LOW (ref 0.3–1.2)
Total Protein: 7.1 g/dL (ref 6.0–8.3)

## 2010-12-29 LAB — CBC
HCT: 39.4 % (ref 39.0–52.0)
Hemoglobin: 13.3 g/dL (ref 13.0–17.0)
MCH: 28.5 pg (ref 26.0–34.0)
MCHC: 33.8 g/dL (ref 30.0–36.0)
MCV: 84.5 fL (ref 78.0–100.0)
Platelets: 294 10*3/uL (ref 150–400)
RBC: 4.66 MIL/uL (ref 4.22–5.81)
RDW: 14.2 % (ref 11.5–15.5)
WBC: 9.4 10*3/uL (ref 4.0–10.5)

## 2010-12-29 LAB — POCT I-STAT TROPONIN I: Troponin i, poc: 0 ng/mL (ref 0.00–0.08)

## 2010-12-29 LAB — DIFFERENTIAL
Basophils Absolute: 0.1 10*3/uL (ref 0.0–0.1)
Basophils Relative: 1 % (ref 0–1)
Eosinophils Absolute: 0.1 10*3/uL (ref 0.0–0.7)
Eosinophils Relative: 1 % (ref 0–5)
Lymphocytes Relative: 17 % (ref 12–46)
Lymphs Abs: 1.6 10*3/uL (ref 0.7–4.0)
Monocytes Absolute: 0.7 10*3/uL (ref 0.1–1.0)
Monocytes Relative: 7 % (ref 3–12)
Neutro Abs: 7 10*3/uL (ref 1.7–7.7)
Neutrophils Relative %: 74 % (ref 43–77)

## 2010-12-29 LAB — D-DIMER, QUANTITATIVE: D-Dimer, Quant: 1.04 ug/mL-FEU — ABNORMAL HIGH (ref 0.00–0.48)

## 2010-12-31 ENCOUNTER — Emergency Department (HOSPITAL_COMMUNITY)
Admission: EM | Admit: 2010-12-31 | Discharge: 2010-12-31 | Discharge status: Against Medical Advice | Payer: Medicare Other | Attending: Emergency Medicine | Admitting: Emergency Medicine

## 2010-12-31 DIAGNOSIS — R6889 Other general symptoms and signs: Secondary | ICD-10-CM | POA: Insufficient documentation

## 2010-12-31 DIAGNOSIS — J3489 Other specified disorders of nose and nasal sinuses: Secondary | ICD-10-CM | POA: Insufficient documentation

## 2010-12-31 DIAGNOSIS — R0602 Shortness of breath: Secondary | ICD-10-CM | POA: Insufficient documentation

## 2011-01-18 LAB — DIFFERENTIAL
Basophils Absolute: 0.1
Basophils Relative: 1
Eosinophils Absolute: 0
Eosinophils Relative: 0
Lymphocytes Relative: 8 — ABNORMAL LOW
Lymphs Abs: 1.3
Monocytes Absolute: 1
Monocytes Relative: 6
Neutro Abs: 13.2 — ABNORMAL HIGH
Neutrophils Relative %: 85 — ABNORMAL HIGH

## 2011-01-18 LAB — CBC
HCT: 42.8
Hemoglobin: 14.7
MCHC: 34.3
MCV: 83.7
Platelets: 297
RBC: 5.12
RDW: 14.2
WBC: 15.5 — ABNORMAL HIGH

## 2011-01-18 LAB — COMPREHENSIVE METABOLIC PANEL
ALT: 45
AST: 63 — ABNORMAL HIGH
Albumin: 3.7
Alkaline Phosphatase: 100
BUN: 24 — ABNORMAL HIGH
CO2: 26
Calcium: 8.4
Chloride: 97
Creatinine, Ser: 1.47
GFR calc Af Amer: 58 — ABNORMAL LOW
GFR calc non Af Amer: 48 — ABNORMAL LOW
Glucose, Bld: 127 — ABNORMAL HIGH
Potassium: 7.1
Sodium: 134 — ABNORMAL LOW
Total Bilirubin: 2.2 — ABNORMAL HIGH
Total Protein: 7.4

## 2011-01-18 LAB — POTASSIUM: Potassium: 3.7

## 2011-04-30 ENCOUNTER — Emergency Department (HOSPITAL_COMMUNITY)
Admission: EM | Admit: 2011-04-30 | Discharge: 2011-04-30 | Disposition: A | Discharge status: Home / Self Care | Payer: Medicare Other | Attending: Emergency Medicine | Admitting: Emergency Medicine

## 2011-04-30 ENCOUNTER — Emergency Department (HOSPITAL_COMMUNITY): Payer: Medicare Other

## 2011-04-30 DIAGNOSIS — R609 Edema, unspecified: Secondary | ICD-10-CM | POA: Diagnosis not present

## 2011-04-30 DIAGNOSIS — L0291 Cutaneous abscess, unspecified: Secondary | ICD-10-CM | POA: Diagnosis not present

## 2011-04-30 DIAGNOSIS — K612 Anorectal abscess: Secondary | ICD-10-CM | POA: Diagnosis not present

## 2011-04-30 DIAGNOSIS — K625 Hemorrhage of anus and rectum: Secondary | ICD-10-CM | POA: Diagnosis not present

## 2011-04-30 DIAGNOSIS — I10 Essential (primary) hypertension: Secondary | ICD-10-CM | POA: Diagnosis not present

## 2011-04-30 DIAGNOSIS — R109 Unspecified abdominal pain: Secondary | ICD-10-CM | POA: Diagnosis not present

## 2011-04-30 DIAGNOSIS — L0390 Cellulitis, unspecified: Secondary | ICD-10-CM | POA: Insufficient documentation

## 2011-04-30 HISTORY — DX: Obesity, unspecified: E66.9

## 2011-04-30 HISTORY — DX: Essential (primary) hypertension: I10

## 2011-04-30 LAB — BASIC METABOLIC PANEL
BUN: 11 mg/dL (ref 6–23)
CO2: 23 mEq/L (ref 19–32)
Calcium: 8.9 mg/dL (ref 8.4–10.5)
Chloride: 99 mEq/L (ref 96–112)
Creatinine, Ser: 0.81 mg/dL (ref 0.50–1.35)
GFR calc Af Amer: >90 mL/min (ref >90)
GFR calc non Af Amer: 89 mL/min — ABNORMAL LOW (ref >90)
Glucose, Bld: 158 mg/dL — ABNORMAL HIGH (ref 70–99)
Potassium: 5 mEq/L (ref 3.5–5.1)
Sodium: 136 mEq/L (ref 135–145)

## 2011-04-30 LAB — URINALYSIS, ROUTINE W REFLEX MICROSCOPIC
Bilirubin Urine: NEGATIVE
Glucose, UA: NEGATIVE mg/dL
Hgb urine dipstick: NEGATIVE
Ketones, ur: NEGATIVE mg/dL
Nitrite: NEGATIVE
Protein, ur: NEGATIVE mg/dL
Specific Gravity, Urine: 1.018 (ref 1.005–1.030)
Urobilinogen, UA: 0.2 mg/dL (ref 0.0–1.0)
pH: 6 (ref 5.0–8.0)

## 2011-04-30 LAB — CBC
HCT: 40.9 % (ref 39.0–52.0)
Hemoglobin: 13.7 g/dL (ref 13.0–17.0)
MCH: 27.2 pg (ref 26.0–34.0)
MCHC: 33.5 g/dL (ref 30.0–36.0)
MCV: 81.2 fL (ref 78.0–100.0)
Platelets: 368 10*3/uL (ref 150–400)
RBC: 5.04 MIL/uL (ref 4.22–5.81)
RDW: 14.5 % (ref 11.5–15.5)
WBC: 9.6 10*3/uL (ref 4.0–10.5)

## 2011-04-30 LAB — DIFFERENTIAL
Basophils Absolute: 0.1 10*3/uL (ref 0.0–0.1)
Basophils Relative: 1 % (ref 0–1)
Eosinophils Absolute: 0.1 10*3/uL (ref 0.0–0.7)
Eosinophils Relative: 1 % (ref 0–5)
Lymphocytes Relative: 16 % (ref 12–46)
Lymphs Abs: 1.5 10*3/uL (ref 0.7–4.0)
Monocytes Absolute: 0.7 10*3/uL (ref 0.1–1.0)
Monocytes Relative: 7 % (ref 3–12)
Neutro Abs: 7.3 10*3/uL (ref 1.7–7.7)
Neutrophils Relative %: 76 % (ref 43–77)

## 2011-04-30 LAB — URINE MICROSCOPIC-ADD ON

## 2011-04-30 LAB — RAPID URINE DRUG SCREEN, HOSP PERFORMED
Amphetamines: NOT DETECTED
Barbiturates: NOT DETECTED
Benzodiazepines: NOT DETECTED
Cocaine: NOT DETECTED
Opiates: NOT DETECTED
Tetrahydrocannabinol: NOT DETECTED

## 2011-04-30 LAB — PROTIME-INR
INR: 1.07 (ref 0.00–1.49)
Prothrombin Time: 14.1 seconds (ref 11.6–15.2)

## 2011-04-30 LAB — ETHANOL: Alcohol, Ethyl (B): <11 mg/dL (ref 0–11)

## 2011-04-30 MED ORDER — SODIUM CHLORIDE 0.9 % IV SOLN
INTRAVENOUS | Status: DC
  Administered 2011-04-30: 125 mL via INTRAVENOUS

## 2011-04-30 MED ORDER — SODIUM CHLORIDE 0.9 % IV BOLUS (SEPSIS)
500.0000 mL | Freq: Once | INTRAVENOUS | Status: AC
  Administered 2011-04-30: 500 mL via INTRAVENOUS

## 2011-04-30 MED ORDER — AMOXICILLIN-POT CLAVULANATE 875-125 MG PO TABS: 1.0000 | ORAL_TABLET | Freq: Two times a day (BID) | ORAL | Status: AC

## 2011-04-30 MED ORDER — AMOXICILLIN-POT CLAVULANATE 875-125 MG PO TABS
1.0000 | ORAL_TABLET | Freq: Once | ORAL | Status: AC
  Administered 2011-04-30: 1 via ORAL
  Filled 2011-04-30: qty 1

## 2011-04-30 NOTE — ED Notes (Addendum)
Patient attempted to urinate in urinal. Accordingly to patient, he missed the urinal all together. I mopped the room

## 2011-04-30 NOTE — ED Notes (Signed)
[id number] Expected date:04/30/11<BR> Expected time: 2:40 PM<BR> Means of arrival:Ambulance<BR> Comments:<BR> EMS 10 GC - GI bleed x 2 days

## 2011-04-30 NOTE — ED Notes (Signed)
Per ems, pt from home, c/o rectal bleeding x 2 days, describe at red bloody. Pt ambulatory

## 2011-04-30 NOTE — ED Notes (Signed)
Pt attempted to use urinal provided for ordered ua but missed and urinated on floor.  Housekeeping paged

## 2011-04-30 NOTE — ED Provider Notes (Signed)
History     CSN: [id number]  Arrival date & time 04/30/11  1447   First MD Initiated Contact with Patient 04/30/11 1511      Chief Complaint  Patient presents with  . Rectal Bleeding    (Consider location/radiation/quality/duration/timing/severity/associated sxs/prior treatment) HPI Brian Silva is a 71 y.o. male presents with c/o bleeding in underwear leading to desire to be assessed in the ED. The sx(s) have been present for several hours. Additional concerns are he feels like he is attaining ball.  Causative factors are none. Palliative factors are none. The distress associated is mild. The disorder has been present for several hours. He has had no altered appetite, vomiting, diarrhea, change in caliber or consistency of the stool.     Past Medical History  Diagnosis Date  . Hypertension   . Asthma   . Obese     No past surgical history on file.  No family history on file.  History  Substance Use Topics  . Smoking status: Not on file  . Smokeless tobacco: Not on file  . Alcohol Use: Yes     occasionally       Review of Systems  All other systems reviewed and are negative.    Allergies  Review of patient's allergies indicates no known allergies.  Home Medications   Current Outpatient Rx  Name Route Sig Dispense Refill  . ALBUTEROL SULFATE HFA 108 (90 BASE) MCG/ACT IN AERS Inhalation Inhale 2 puffs into the lungs every 6 (six) hours as needed.      Marland Kitchen AMLODIPINE BESYLATE 10 MG PO TABS Oral Take 10 mg by mouth daily.      . ATORVASTATIN CALCIUM 20 MG PO TABS Oral Take 20 mg by mouth at bedtime.      Marland Kitchen CITALOPRAM HYDROBROMIDE 10 MG PO TABS Oral Take 10 mg by mouth daily.      Marland Kitchen CLONIDINE HCL 0.2 MG PO TABS Oral Take 0.2 mg by mouth 2 (two) times daily.      Marland Kitchen DOXYCYCLINE HYCLATE 100 MG PO TABS Oral Take 100 mg by mouth 2 (two) times daily.      . FUROSEMIDE 80 MG PO TABS Oral Take 80 mg by mouth 2 (two) times daily.      Marland Kitchen NABUMETONE 500 MG PO TABS Oral  Take 500 mg by mouth 2 (two) times daily.      . OXYCODONE HCL ER 10 MG PO TB12 Oral Take 10 mg by mouth every 12 (twelve) hours.      . AMOXICILLIN-POT CLAVULANATE 875-125 MG PO TABS Oral Take 1 tablet by mouth every 12 (twelve) hours. 14 tablet 0    BP 125/87  Pulse 100  Temp(Src) 98 F (36.7 C) (Oral)  Resp 20  SpO2 100%  Physical Exam  Nursing note and vitals reviewed. Constitutional: He is oriented to person, place, and time. He appears well-developed and well-nourished.  HENT:  Head: Normocephalic and atraumatic.  Right Ear: External ear normal.  Left Ear: External ear normal.  Eyes: Conjunctivae and EOM are normal. Pupils are equal, round, and reactive to light.  Neck: Normal range of motion and phonation normal. Neck supple.  Cardiovascular: Normal rate, regular rhythm, normal heart sounds and intact distal pulses.   Pulmonary/Chest: Effort normal and breath sounds normal. He exhibits no bony tenderness.  Abdominal: Soft. Normal appearance. There is no tenderness.  Genitourinary:       He has a draining perineal abscess between the scrotum and the anus  with muco-purulent and serosanguineous discharge. The area of induration and fluctuance is about 2 cm;  no surrounding cellulitis.  Musculoskeletal: Normal range of motion. He exhibits edema.       He has erythema of the anterior shin on the left with greater swelling on the left than the right lower leg. There is no discharge from the left shin.  Neurological: He is alert and oriented to person, place, and time. He has normal strength. No cranial nerve deficit or sensory deficit. He exhibits normal muscle tone. Coordination normal.  Skin: Skin is warm, dry and intact.  Psychiatric: He has a normal mood and affect. His behavior is normal. Judgment and thought content normal.    ED Course  Procedures (including critical care time)  Labs Reviewed  BASIC METABOLIC PANEL - Abnormal; Notable for the following:    Glucose, Bld  158 (*)    GFR calc non Af Amer 89 (*)    All other components within normal limits  URINALYSIS, ROUTINE W REFLEX MICROSCOPIC - Abnormal; Notable for the following:    APPearance CLOUDY (*)    Leukocytes, UA SMALL (*)    All other components within normal limits  URINE MICROSCOPIC-ADD ON - Abnormal; Notable for the following:    Bacteria, UA FEW (*)    All other components within normal limits  CBC  DIFFERENTIAL  ETHANOL  URINE RAPID DRUG SCREEN (HOSP PERFORMED)  PROTIME-INR  OCCULT BLOOD X 1 CARD TO LAB, STOOL   No results found.   1. Abscess       MDM  Simple perineal abscess without systemic symptoms. Patient is stable for discharge        Richarda Blade, MD 04/30/11 1759

## 2011-04-30 NOTE — ED Notes (Signed)
Pt offered bus pass but stated he would not be able to walk to the bus wait area. Pt states he has cash available for cab.

## 2011-04-30 NOTE — ED Notes (Signed)
Patient aware of need for urine specimen. Patient unable to void at this time. Patient given urinal. Encouraged to call for assistance if needed.   

## 2011-07-04 DIAGNOSIS — I10 Essential (primary) hypertension: Secondary | ICD-10-CM | POA: Diagnosis not present

## 2011-07-04 DIAGNOSIS — R7309 Other abnormal glucose: Secondary | ICD-10-CM | POA: Diagnosis not present

## 2011-07-04 DIAGNOSIS — I739 Peripheral vascular disease, unspecified: Secondary | ICD-10-CM | POA: Diagnosis not present

## 2011-07-04 DIAGNOSIS — Z79899 Other long term (current) drug therapy: Secondary | ICD-10-CM | POA: Diagnosis not present

## 2011-09-03 DIAGNOSIS — I10 Essential (primary) hypertension: Secondary | ICD-10-CM | POA: Diagnosis not present

## 2011-09-03 DIAGNOSIS — E119 Type 2 diabetes mellitus without complications: Secondary | ICD-10-CM | POA: Diagnosis not present

## 2011-11-07 DIAGNOSIS — I10 Essential (primary) hypertension: Secondary | ICD-10-CM | POA: Diagnosis not present

## 2011-11-07 DIAGNOSIS — E119 Type 2 diabetes mellitus without complications: Secondary | ICD-10-CM | POA: Diagnosis not present

## 2011-11-07 DIAGNOSIS — M545 Low back pain, unspecified: Secondary | ICD-10-CM | POA: Diagnosis not present

## 2011-11-23 DIAGNOSIS — K029 Dental caries, unspecified: Secondary | ICD-10-CM | POA: Diagnosis not present

## 2012-01-03 DIAGNOSIS — Z125 Encounter for screening for malignant neoplasm of prostate: Secondary | ICD-10-CM | POA: Diagnosis not present

## 2012-01-03 DIAGNOSIS — E785 Hyperlipidemia, unspecified: Secondary | ICD-10-CM | POA: Diagnosis not present

## 2012-01-03 DIAGNOSIS — F329 Major depressive disorder, single episode, unspecified: Secondary | ICD-10-CM | POA: Diagnosis not present

## 2012-01-03 DIAGNOSIS — E119 Type 2 diabetes mellitus without complications: Secondary | ICD-10-CM | POA: Diagnosis not present

## 2012-01-03 DIAGNOSIS — Z23 Encounter for immunization: Secondary | ICD-10-CM | POA: Diagnosis not present

## 2012-01-03 DIAGNOSIS — J45909 Unspecified asthma, uncomplicated: Secondary | ICD-10-CM | POA: Diagnosis not present

## 2012-01-03 DIAGNOSIS — G894 Chronic pain syndrome: Secondary | ICD-10-CM | POA: Diagnosis not present

## 2012-01-03 DIAGNOSIS — I10 Essential (primary) hypertension: Secondary | ICD-10-CM | POA: Diagnosis not present

## 2012-01-03 DIAGNOSIS — I509 Heart failure, unspecified: Secondary | ICD-10-CM | POA: Diagnosis not present

## 2012-02-19 DIAGNOSIS — R7301 Impaired fasting glucose: Secondary | ICD-10-CM | POA: Diagnosis not present

## 2012-02-19 DIAGNOSIS — I10 Essential (primary) hypertension: Secondary | ICD-10-CM | POA: Diagnosis not present

## 2012-02-19 DIAGNOSIS — I739 Peripheral vascular disease, unspecified: Secondary | ICD-10-CM | POA: Diagnosis not present

## 2012-02-19 DIAGNOSIS — E785 Hyperlipidemia, unspecified: Secondary | ICD-10-CM | POA: Diagnosis not present

## 2012-02-19 DIAGNOSIS — I509 Heart failure, unspecified: Secondary | ICD-10-CM | POA: Diagnosis not present

## 2012-03-14 DIAGNOSIS — R0609 Other forms of dyspnea: Secondary | ICD-10-CM | POA: Diagnosis not present

## 2012-03-14 DIAGNOSIS — I10 Essential (primary) hypertension: Secondary | ICD-10-CM | POA: Diagnosis not present

## 2012-03-14 DIAGNOSIS — R0989 Other specified symptoms and signs involving the circulatory and respiratory systems: Secondary | ICD-10-CM | POA: Diagnosis not present

## 2012-03-25 DIAGNOSIS — R7981 Abnormal blood-gas level: Secondary | ICD-10-CM | POA: Diagnosis not present

## 2012-03-25 DIAGNOSIS — E785 Hyperlipidemia, unspecified: Secondary | ICD-10-CM | POA: Diagnosis not present

## 2012-03-25 DIAGNOSIS — F329 Major depressive disorder, single episode, unspecified: Secondary | ICD-10-CM | POA: Diagnosis not present

## 2012-03-25 DIAGNOSIS — E65 Localized adiposity: Secondary | ICD-10-CM | POA: Diagnosis not present

## 2012-03-25 DIAGNOSIS — I10 Essential (primary) hypertension: Secondary | ICD-10-CM | POA: Diagnosis not present

## 2012-03-25 DIAGNOSIS — G894 Chronic pain syndrome: Secondary | ICD-10-CM | POA: Diagnosis not present

## 2012-03-25 DIAGNOSIS — I509 Heart failure, unspecified: Secondary | ICD-10-CM | POA: Diagnosis not present

## 2012-03-25 DIAGNOSIS — L259 Unspecified contact dermatitis, unspecified cause: Secondary | ICD-10-CM | POA: Diagnosis not present

## 2012-03-30 ENCOUNTER — Emergency Department (HOSPITAL_COMMUNITY): Payer: Medicare Other

## 2012-03-30 ENCOUNTER — Encounter (HOSPITAL_COMMUNITY): Payer: Self-pay | Admitting: Emergency Medicine

## 2012-03-30 ENCOUNTER — Emergency Department (HOSPITAL_COMMUNITY)
Admission: EM | Admit: 2012-03-30 | Discharge: 2012-03-31 | Disposition: A | Discharge status: Home / Self Care | Payer: Medicare Other | Attending: Emergency Medicine | Admitting: Emergency Medicine

## 2012-03-30 DIAGNOSIS — J45909 Unspecified asthma, uncomplicated: Secondary | ICD-10-CM | POA: Insufficient documentation

## 2012-03-30 DIAGNOSIS — Z79899 Other long term (current) drug therapy: Secondary | ICD-10-CM | POA: Insufficient documentation

## 2012-03-30 DIAGNOSIS — I10 Essential (primary) hypertension: Secondary | ICD-10-CM | POA: Diagnosis not present

## 2012-03-30 DIAGNOSIS — R6889 Other general symptoms and signs: Secondary | ICD-10-CM | POA: Diagnosis not present

## 2012-03-30 DIAGNOSIS — E669 Obesity, unspecified: Secondary | ICD-10-CM | POA: Insufficient documentation

## 2012-03-30 DIAGNOSIS — K429 Umbilical hernia without obstruction or gangrene: Secondary | ICD-10-CM | POA: Diagnosis not present

## 2012-03-30 DIAGNOSIS — R109 Unspecified abdominal pain: Secondary | ICD-10-CM | POA: Diagnosis not present

## 2012-03-30 LAB — URINALYSIS, ROUTINE W REFLEX MICROSCOPIC
Bilirubin Urine: NEGATIVE
Glucose, UA: NEGATIVE mg/dL
Hgb urine dipstick: NEGATIVE
Ketones, ur: NEGATIVE mg/dL
Leukocytes, UA: NEGATIVE
Nitrite: NEGATIVE
Protein, ur: NEGATIVE mg/dL
Specific Gravity, Urine: 1.014 (ref 1.005–1.030)
Urobilinogen, UA: 0.2 mg/dL (ref 0.0–1.0)
pH: 6.5 (ref 5.0–8.0)

## 2012-03-30 LAB — CBC WITH DIFFERENTIAL/PLATELET
Basophils Absolute: 0.1 10*3/uL (ref 0.0–0.1)
Basophils Relative: 0 % (ref 0–1)
Eosinophils Absolute: 0.1 10*3/uL (ref 0.0–0.7)
Eosinophils Relative: 1 % (ref 0–5)
HCT: 41.2 % (ref 39.0–52.0)
Hemoglobin: 14 g/dL (ref 13.0–17.0)
Lymphocytes Relative: 10 % — ABNORMAL LOW (ref 12–46)
Lymphs Abs: 1.3 10*3/uL (ref 0.7–4.0)
MCH: 28.5 pg (ref 26.0–34.0)
MCHC: 34 g/dL (ref 30.0–36.0)
MCV: 83.9 fL (ref 78.0–100.0)
Monocytes Absolute: 0.5 10*3/uL (ref 0.1–1.0)
Monocytes Relative: 4 % (ref 3–12)
Neutro Abs: 10.5 10*3/uL — ABNORMAL HIGH (ref 1.7–7.7)
Neutrophils Relative %: 85 % — ABNORMAL HIGH (ref 43–77)
Platelets: 309 10*3/uL (ref 150–400)
RBC: 4.91 MIL/uL (ref 4.22–5.81)
RDW: 13.7 % (ref 11.5–15.5)
WBC: 12.4 10*3/uL — ABNORMAL HIGH (ref 4.0–10.5)

## 2012-03-30 LAB — COMPREHENSIVE METABOLIC PANEL
ALT: 23 U/L (ref 0–53)
AST: 21 U/L (ref 0–37)
Albumin: 3.5 g/dL (ref 3.5–5.2)
Alkaline Phosphatase: 107 U/L (ref 39–117)
BUN: 22 mg/dL (ref 6–23)
CO2: 24 mEq/L (ref 19–32)
Calcium: 8.7 mg/dL (ref 8.4–10.5)
Chloride: 98 mEq/L (ref 96–112)
Creatinine, Ser: 0.89 mg/dL (ref 0.50–1.35)
GFR calc Af Amer: >90 mL/min (ref >90)
GFR calc non Af Amer: 85 mL/min — ABNORMAL LOW (ref >90)
Glucose, Bld: 169 mg/dL — ABNORMAL HIGH (ref 70–99)
Potassium: 4.5 mEq/L (ref 3.5–5.1)
Sodium: 132 mEq/L — ABNORMAL LOW (ref 135–145)
Total Bilirubin: 0.3 mg/dL (ref 0.3–1.2)
Total Protein: 7.5 g/dL (ref 6.0–8.3)

## 2012-03-30 LAB — LIPASE, BLOOD: Lipase: 20 U/L (ref 11–59)

## 2012-03-30 LAB — LACTIC ACID, PLASMA: Lactic Acid, Venous: 1.3 mmol/L (ref 0.5–2.2)

## 2012-03-30 MED ORDER — HYDROMORPHONE HCL PF 2 MG/ML IJ SOLN
2.0000 mg | Freq: Once | INTRAMUSCULAR | Status: AC
  Administered 2012-03-30: 2 mg via INTRAMUSCULAR
  Filled 2012-03-30: qty 1

## 2012-03-30 MED ORDER — ONDANSETRON HCL 4 MG/2ML IJ SOLN
4.0000 mg | Freq: Once | INTRAMUSCULAR | Status: AC
  Administered 2012-03-30: 4 mg via INTRAVENOUS
  Filled 2012-03-30: qty 2

## 2012-03-30 MED ORDER — HYDROMORPHONE HCL PF 1 MG/ML IJ SOLN: 1.0000 mg | Freq: Once | INTRAMUSCULAR | Status: DC

## 2012-03-30 MED ORDER — HYDROMORPHONE HCL PF 1 MG/ML IJ SOLN
1.0000 mg | Freq: Once | INTRAMUSCULAR | Status: DC
  Filled 2012-03-30: qty 1

## 2012-03-30 NOTE — ED Notes (Signed)
Patient had 1 episode of projectile vomit at approximately 2310. Dilaudid given at 2240. MD notified of emesis and placed order for IV zofran. Patient assisted in cleaning himself.

## 2012-03-30 NOTE — ED Notes (Signed)
Per [facility], umbilical pain. Denies shortness of breath, nausea, vomiting, constipation, diarrhea, blood in stool. Pt experiences pain relief with palpation, but no rebound tenderness. Onset approximately 1600.

## 2012-03-30 NOTE — ED Notes (Signed)
[id number] Expected date:<BR> Expected time:<BR> Means of arrival:<BR> Comments:<BR> EMS abd pain

## 2012-03-30 NOTE — ED Notes (Signed)
Pt is difficult stick, attempted x2 unsuccessful

## 2012-03-30 NOTE — ED Provider Notes (Signed)
History     CSN: [id number]  Arrival date & time 03/30/12  2129   First MD Initiated Contact with Patient 03/30/12 2139      Chief Complaint  Patient presents with  . Abdominal Pain    (Consider location/radiation/quality/duration/timing/severity/associated sxs/prior treatment) The history is provided by the patient.  Brian Silva is a 71 y.o. male hx of obesity, HTN, umbilical hernia here with ab pain. He was asleep around 4pm today, when he suddenly had periumbilical pain that woke him up. Since then, he had intermittent periumbilical pain around his hernia site. It was a crampy feeling and pain didn't radiate. Denies nausea, vomiting, constipation, or diarrhea. Denies fevers or urinary symptoms.    Past Medical History  Diagnosis Date  . Hypertension   . Asthma   . Obese     History reviewed. No pertinent past surgical history.  No family history on file.  History  Substance Use Topics  . Smoking status: Not on file  . Smokeless tobacco: Not on file  . Alcohol Use: Yes     Comment: occasionally       Review of Systems  Gastrointestinal: Positive for abdominal pain.  All other systems reviewed and are negative.    Allergies  Review of patient's allergies indicates no known allergies.  Home Medications   Current Outpatient Rx  Name  Route  Sig  Dispense  Refill  . ALBUTEROL SULFATE HFA 108 (90 BASE) MCG/ACT IN AERS   Inhalation   Inhale 2 puffs into the lungs every 6 (six) hours as needed. For shortness of breath         . AMLODIPINE BESYLATE 10 MG PO TABS   Oral   Take 10 mg by mouth daily.           . ATORVASTATIN CALCIUM 20 MG PO TABS   Oral   Take 20 mg by mouth at bedtime.           Marland Kitchen CITALOPRAM HYDROBROMIDE 10 MG PO TABS   Oral   Take 10 mg by mouth daily.           Marland Kitchen CLONIDINE HCL 0.3 MG PO TABS   Oral   Take 0.3 mg by mouth 2 (two) times daily.         . FUROSEMIDE 80 MG PO TABS   Oral   Take 80 mg by mouth 2 (two)  times daily.           Marland Kitchen LOSARTAN POTASSIUM 50 MG PO TABS   Oral   Take 50 mg by mouth daily.           BP 174/55  Pulse 72  Temp 98.7 F (37.1 C) (Oral)  Resp 20  SpO2 99%  Physical Exam  Nursing note and vitals reviewed. Constitutional: He is oriented to person, place, and time. He appears well-developed and well-nourished.       Obese, slightly uncomfortable   HENT:  Head: Normocephalic.  Mouth/Throat: Oropharynx is clear and moist.  Eyes: Conjunctivae normal are normal. Pupils are equal, round, and reactive to light.  Neck: Normal range of motion. Neck supple.  Cardiovascular: Normal rate, regular rhythm and normal heart sounds.   Pulmonary/Chest: Effort normal and breath sounds normal. No respiratory distress. He has no wheezes. He has no rales.  Abdominal: Soft.       Obese. Mild tenderness over umbilical hernia with mild bluish discoloration over the area. Hernia reducible. Abdomen otherwise nontender. No CVAT.  Musculoskeletal: Normal range of motion.       2+ edema bilaterally (chronic per patient)   Neurological: He is alert and oriented to person, place, and time.  Skin: Skin is warm and dry.  Psychiatric: He has a normal mood and affect. His behavior is normal. Judgment and thought content normal.    ED Course  Procedures (including critical care time)  Labs Reviewed  CBC WITH DIFFERENTIAL - Abnormal; Notable for the following:    WBC 12.4 (*)     Neutrophils Relative 85 (*)     Neutro Abs 10.5 (*)     Lymphocytes Relative 10 (*)     All other components within normal limits  URINALYSIS, ROUTINE W REFLEX MICROSCOPIC  COMPREHENSIVE METABOLIC PANEL  LIPASE, BLOOD  LACTIC ACID, PLASMA   No results found.   No diagnosis found.    MDM  Brian Silva is a 71 y.o. male here with ab pain around hernia site. Will need to get CT ab/pel to assess the hernia. Will get labs and give pain meds and reassess.   11:35 PM WBC 12, other labs pending. I  signed out to Dr. Florina Ou to follow up the CT and labs.         Wandra Arthurs, MD 03/30/12 [phone]

## 2012-03-31 DIAGNOSIS — K429 Umbilical hernia without obstruction or gangrene: Secondary | ICD-10-CM | POA: Diagnosis not present

## 2012-03-31 MED ORDER — PROMETHAZINE HCL 25 MG/ML IJ SOLN
12.5000 mg | Freq: Once | INTRAMUSCULAR | Status: AC
  Administered 2012-03-31: 12.5 mg via INTRAVENOUS
  Filled 2012-03-31: qty 1

## 2012-03-31 MED ORDER — IOHEXOL 300 MG/ML  SOLN
100.0000 mL | Freq: Once | INTRAMUSCULAR | Status: AC | PRN
  Administered 2012-03-31: 100 mL via INTRAVENOUS

## 2012-03-31 MED ORDER — OXYCODONE-ACETAMINOPHEN 5-325 MG PO TABS
1.0000 | ORAL_TABLET | Freq: Four times a day (QID) | ORAL | Status: DC | PRN
Start: 2012-03-31 — End: 2013-07-09

## 2012-03-31 NOTE — ED Provider Notes (Signed)
Nursing notes and vitals signs, including pulse oximetry, reviewed.  Summary of this visit's results, reviewed by myself:  Labs:  Results for orders placed during the hospital encounter of 03/30/12 (from the past 24 hour(s))  URINALYSIS, ROUTINE W REFLEX MICROSCOPIC     Status: Normal   Collection Time   03/30/12 10:27 PM      Component Value Range   Color, Urine YELLOW  YELLOW   APPearance CLEAR  CLEAR   Specific Gravity, Urine 1.014  1.005 - 1.030   pH 6.5  5.0 - 8.0   Glucose, UA NEGATIVE  NEGATIVE mg/dL   Hgb urine dipstick NEGATIVE  NEGATIVE   Bilirubin Urine NEGATIVE  NEGATIVE   Ketones, ur NEGATIVE  NEGATIVE mg/dL   Protein, ur NEGATIVE  NEGATIVE mg/dL   Urobilinogen, UA 0.2  0.0 - 1.0 mg/dL   Nitrite NEGATIVE  NEGATIVE   Leukocytes, UA NEGATIVE  NEGATIVE  CBC WITH DIFFERENTIAL     Status: Abnormal   Collection Time   03/30/12 11:07 PM      Component Value Range   WBC 12.4 (*) 4.0 - 10.5 K/uL   RBC 4.91  4.22 - 5.81 MIL/uL   Hemoglobin 14.0  13.0 - 17.0 g/dL   HCT 41.2  39.0 - 52.0 %   MCV 83.9  78.0 - 100.0 fL   MCH 28.5  26.0 - 34.0 pg   MCHC 34.0  30.0 - 36.0 g/dL   RDW 13.7  11.5 - 15.5 %   Platelets 309  150 - 400 K/uL   Neutrophils Relative 85 (*) 43 - 77 %   Neutro Abs 10.5 (*) 1.7 - 7.7 K/uL   Lymphocytes Relative 10 (*) 12 - 46 %   Lymphs Abs 1.3  0.7 - 4.0 K/uL   Monocytes Relative 4  3 - 12 %   Monocytes Absolute 0.5  0.1 - 1.0 K/uL   Eosinophils Relative 1  0 - 5 %   Eosinophils Absolute 0.1  0.0 - 0.7 K/uL   Basophils Relative 0  0 - 1 %   Basophils Absolute 0.1  0.0 - 0.1 K/uL  COMPREHENSIVE METABOLIC PANEL     Status: Abnormal   Collection Time   03/30/12 11:07 PM      Component Value Range   Sodium 132 (*) 135 - 145 mEq/L   Potassium 4.5  3.5 - 5.1 mEq/L   Chloride 98  96 - 112 mEq/L   CO2 24  19 - 32 mEq/L   Glucose, Bld 169 (*) 70 - 99 mg/dL   BUN 22  6 - 23 mg/dL   Creatinine, Ser 0.89  0.50 - 1.35 mg/dL   Calcium 8.7  8.4 - 10.5 mg/dL   Total Protein 7.5  6.0 - 8.3 g/dL   Albumin 3.5  3.5 - 5.2 g/dL   AST 21  0 - 37 U/L   ALT 23  0 - 53 U/L   Alkaline Phosphatase 107  39 - 117 U/L   Total Bilirubin 0.3  0.3 - 1.2 mg/dL   GFR calc non Af Amer 85 (*) >90 mL/min   GFR calc Af Amer >90  >90 mL/min  LIPASE, BLOOD     Status: Normal   Collection Time   03/30/12 11:07 PM      Component Value Range   Lipase 20  11 - 59 U/L  LACTIC ACID, PLASMA     Status: Normal   Collection Time   03/30/12 11:07 PM  Component Value Range   Lactic Acid, Venous 1.3  0.5 - 2.2 mmol/L    Imaging Studies: Ct Abdomen Pelvis W Contrast  03/31/2012  *RADIOLOGY REPORT*  Clinical Data: Periumbilical pain.  Nausea and vomiting.  White cell count 12.4.  CT ABDOMEN AND PELVIS WITH CONTRAST  Technique:  Multidetector CT imaging of the abdomen and pelvis was performed following the standard protocol during bolus administration of intravenous contrast.  Contrast: 140mL OMNIPAQUE IOHEXOL 300 MG/ML  SOLN  Comparison: 11/24/2009  Findings: Mild scarring or atelectasis in the lung bases.  Calcified granulomas in the spleen.  The liver, gallbladder, pancreas, adrenal glands, kidneys, and retroperitoneal lymph nodes are unremarkable.  Calcification of the aorta without aneurysm. Moderately prominent visceral adipose tissues.  No free air or free fluid in the abdomen.  The stomach, small bowel, and colon are not abnormally distended.  Stool filled colon.  There is a periumbilical midline abdominal wall hernia containing fat.  No bowel content or evidence of bowel obstruction.  The appearance is similar to the previous study.  Pelvis:  No bladder wall thickening.  Diverticula in the sigmoid colon without diverticulitis.  No free or loculated pelvic fluid collections.  Prostate gland is not enlarged.  No significant pelvic lymphadenopathy.  The appendix is normal.  Degenerative changes in the lumbar spine.  IMPRESSION: Periumbilical hernia containing fat, similar to  previous study.  No bowel content or bowel obstruction.  Appendix is normal.   Original Report Authenticated By: Lucienne Capers, M.D.    3:21 AM Patient denies pain or nausea at this time. His umbilical hernia is reducible and nontender. We will refer to [facility] for elective repair.   Wynetta Fines, MD 03/31/12 [phone]

## 2012-05-30 DIAGNOSIS — I509 Heart failure, unspecified: Secondary | ICD-10-CM | POA: Diagnosis not present

## 2012-05-30 DIAGNOSIS — J329 Chronic sinusitis, unspecified: Secondary | ICD-10-CM | POA: Diagnosis not present

## 2012-05-30 DIAGNOSIS — I10 Essential (primary) hypertension: Secondary | ICD-10-CM | POA: Diagnosis not present

## 2012-05-30 DIAGNOSIS — J45909 Unspecified asthma, uncomplicated: Secondary | ICD-10-CM | POA: Diagnosis not present

## 2012-05-30 DIAGNOSIS — G894 Chronic pain syndrome: Secondary | ICD-10-CM | POA: Diagnosis not present

## 2012-05-30 DIAGNOSIS — E785 Hyperlipidemia, unspecified: Secondary | ICD-10-CM | POA: Diagnosis not present

## 2012-05-30 DIAGNOSIS — F329 Major depressive disorder, single episode, unspecified: Secondary | ICD-10-CM | POA: Diagnosis not present

## 2012-07-21 DIAGNOSIS — I10 Essential (primary) hypertension: Secondary | ICD-10-CM | POA: Diagnosis not present

## 2012-07-21 DIAGNOSIS — G894 Chronic pain syndrome: Secondary | ICD-10-CM | POA: Diagnosis not present

## 2012-07-21 DIAGNOSIS — I509 Heart failure, unspecified: Secondary | ICD-10-CM | POA: Diagnosis not present

## 2012-07-21 DIAGNOSIS — J45909 Unspecified asthma, uncomplicated: Secondary | ICD-10-CM | POA: Diagnosis not present

## 2012-07-21 DIAGNOSIS — R7301 Impaired fasting glucose: Secondary | ICD-10-CM | POA: Diagnosis not present

## 2012-07-21 DIAGNOSIS — E785 Hyperlipidemia, unspecified: Secondary | ICD-10-CM | POA: Diagnosis not present

## 2012-07-21 DIAGNOSIS — F329 Major depressive disorder, single episode, unspecified: Secondary | ICD-10-CM | POA: Diagnosis not present

## 2012-09-30 DIAGNOSIS — F329 Major depressive disorder, single episode, unspecified: Secondary | ICD-10-CM | POA: Diagnosis not present

## 2012-09-30 DIAGNOSIS — R7301 Impaired fasting glucose: Secondary | ICD-10-CM | POA: Diagnosis not present

## 2012-09-30 DIAGNOSIS — J45909 Unspecified asthma, uncomplicated: Secondary | ICD-10-CM | POA: Diagnosis not present

## 2012-09-30 DIAGNOSIS — I10 Essential (primary) hypertension: Secondary | ICD-10-CM | POA: Diagnosis not present

## 2012-09-30 DIAGNOSIS — Z79899 Other long term (current) drug therapy: Secondary | ICD-10-CM | POA: Diagnosis not present

## 2012-09-30 DIAGNOSIS — E785 Hyperlipidemia, unspecified: Secondary | ICD-10-CM | POA: Diagnosis not present

## 2012-09-30 DIAGNOSIS — I509 Heart failure, unspecified: Secondary | ICD-10-CM | POA: Diagnosis not present

## 2012-09-30 DIAGNOSIS — G894 Chronic pain syndrome: Secondary | ICD-10-CM | POA: Diagnosis not present

## 2012-09-30 DIAGNOSIS — I739 Peripheral vascular disease, unspecified: Secondary | ICD-10-CM | POA: Diagnosis not present

## 2013-01-29 DIAGNOSIS — R7301 Impaired fasting glucose: Secondary | ICD-10-CM | POA: Diagnosis not present

## 2013-01-29 DIAGNOSIS — I509 Heart failure, unspecified: Secondary | ICD-10-CM | POA: Diagnosis not present

## 2013-01-29 DIAGNOSIS — G894 Chronic pain syndrome: Secondary | ICD-10-CM | POA: Diagnosis not present

## 2013-01-29 DIAGNOSIS — F329 Major depressive disorder, single episode, unspecified: Secondary | ICD-10-CM | POA: Diagnosis not present

## 2013-01-29 DIAGNOSIS — I10 Essential (primary) hypertension: Secondary | ICD-10-CM | POA: Diagnosis not present

## 2013-01-29 DIAGNOSIS — J45909 Unspecified asthma, uncomplicated: Secondary | ICD-10-CM | POA: Diagnosis not present

## 2013-01-29 DIAGNOSIS — E785 Hyperlipidemia, unspecified: Secondary | ICD-10-CM | POA: Diagnosis not present

## 2013-01-29 DIAGNOSIS — I739 Peripheral vascular disease, unspecified: Secondary | ICD-10-CM | POA: Diagnosis not present

## 2013-04-22 DIAGNOSIS — R7301 Impaired fasting glucose: Secondary | ICD-10-CM | POA: Diagnosis not present

## 2013-04-22 DIAGNOSIS — I509 Heart failure, unspecified: Secondary | ICD-10-CM | POA: Diagnosis not present

## 2013-04-22 DIAGNOSIS — I739 Peripheral vascular disease, unspecified: Secondary | ICD-10-CM | POA: Diagnosis not present

## 2013-04-22 DIAGNOSIS — I10 Essential (primary) hypertension: Secondary | ICD-10-CM | POA: Diagnosis not present

## 2013-04-22 DIAGNOSIS — J40 Bronchitis, not specified as acute or chronic: Secondary | ICD-10-CM | POA: Diagnosis not present

## 2013-04-22 DIAGNOSIS — J45909 Unspecified asthma, uncomplicated: Secondary | ICD-10-CM | POA: Diagnosis not present

## 2013-04-22 DIAGNOSIS — E785 Hyperlipidemia, unspecified: Secondary | ICD-10-CM | POA: Diagnosis not present

## 2013-04-22 DIAGNOSIS — G894 Chronic pain syndrome: Secondary | ICD-10-CM | POA: Diagnosis not present

## 2013-06-23 DIAGNOSIS — I10 Essential (primary) hypertension: Secondary | ICD-10-CM | POA: Diagnosis not present

## 2013-06-23 DIAGNOSIS — I509 Heart failure, unspecified: Secondary | ICD-10-CM | POA: Diagnosis not present

## 2013-06-23 DIAGNOSIS — F329 Major depressive disorder, single episode, unspecified: Secondary | ICD-10-CM | POA: Diagnosis not present

## 2013-06-23 DIAGNOSIS — G894 Chronic pain syndrome: Secondary | ICD-10-CM | POA: Diagnosis not present

## 2013-06-23 DIAGNOSIS — R7301 Impaired fasting glucose: Secondary | ICD-10-CM | POA: Diagnosis not present

## 2013-06-23 DIAGNOSIS — J45909 Unspecified asthma, uncomplicated: Secondary | ICD-10-CM | POA: Diagnosis not present

## 2013-06-23 DIAGNOSIS — F3289 Other specified depressive episodes: Secondary | ICD-10-CM | POA: Diagnosis not present

## 2013-06-23 DIAGNOSIS — E785 Hyperlipidemia, unspecified: Secondary | ICD-10-CM | POA: Diagnosis not present

## 2013-07-09 ENCOUNTER — Emergency Department (HOSPITAL_COMMUNITY): Payer: Medicare Other

## 2013-07-09 ENCOUNTER — Emergency Department (HOSPITAL_COMMUNITY)
Admission: EM | Admit: 2013-07-09 | Discharge: 2013-07-10 | Disposition: A | Discharge status: Home / Self Care | Payer: Medicare Other | Attending: Emergency Medicine | Admitting: Emergency Medicine

## 2013-07-09 ENCOUNTER — Encounter (HOSPITAL_COMMUNITY): Payer: Self-pay | Admitting: Emergency Medicine

## 2013-07-09 DIAGNOSIS — E669 Obesity, unspecified: Secondary | ICD-10-CM | POA: Insufficient documentation

## 2013-07-09 DIAGNOSIS — I10 Essential (primary) hypertension: Secondary | ICD-10-CM | POA: Insufficient documentation

## 2013-07-09 DIAGNOSIS — R0602 Shortness of breath: Secondary | ICD-10-CM | POA: Insufficient documentation

## 2013-07-09 DIAGNOSIS — R072 Precordial pain: Secondary | ICD-10-CM | POA: Diagnosis not present

## 2013-07-09 DIAGNOSIS — R42 Dizziness and giddiness: Secondary | ICD-10-CM | POA: Diagnosis not present

## 2013-07-09 DIAGNOSIS — R079 Chest pain, unspecified: Secondary | ICD-10-CM | POA: Diagnosis not present

## 2013-07-09 LAB — BASIC METABOLIC PANEL
BUN: 21 mg/dL (ref 6–23)
CO2: 24 mEq/L (ref 19–32)
Calcium: 8.8 mg/dL (ref 8.4–10.5)
Chloride: 98 mEq/L (ref 96–112)
Creatinine, Ser: 0.91 mg/dL (ref 0.50–1.35)
GFR calc Af Amer: >90 mL/min (ref >90)
GFR calc non Af Amer: 84 mL/min — ABNORMAL LOW (ref >90)
Glucose, Bld: 129 mg/dL — ABNORMAL HIGH (ref 70–99)
Potassium: 4.3 mEq/L (ref 3.7–5.3)
Sodium: 135 mEq/L — ABNORMAL LOW (ref 137–147)

## 2013-07-09 LAB — CBC WITH DIFFERENTIAL/PLATELET
Basophils Absolute: 0.1 10*3/uL (ref 0.0–0.1)
Basophils Relative: 1 % (ref 0–1)
Eosinophils Absolute: 0.1 10*3/uL (ref 0.0–0.7)
Eosinophils Relative: 1 % (ref 0–5)
HCT: 38.4 % — ABNORMAL LOW (ref 39.0–52.0)
Hemoglobin: 13.1 g/dL (ref 13.0–17.0)
Lymphocytes Relative: 9 % — ABNORMAL LOW (ref 12–46)
Lymphs Abs: 1.3 10*3/uL (ref 0.7–4.0)
MCH: 29.3 pg (ref 26.0–34.0)
MCHC: 34.1 g/dL (ref 30.0–36.0)
MCV: 85.9 fL (ref 78.0–100.0)
Monocytes Absolute: 1 10*3/uL (ref 0.1–1.0)
Monocytes Relative: 7 % (ref 3–12)
Neutro Abs: 12.4 10*3/uL — ABNORMAL HIGH (ref 1.7–7.7)
Neutrophils Relative %: 82 % — ABNORMAL HIGH (ref 43–77)
Platelets: 278 10*3/uL (ref 150–400)
RBC: 4.47 MIL/uL (ref 4.22–5.81)
RDW: 13.9 % (ref 11.5–15.5)
WBC: 14.9 10*3/uL — ABNORMAL HIGH (ref 4.0–10.5)

## 2013-07-09 LAB — TROPONIN I: Troponin I: <0.3 ng/mL (ref <0.30)

## 2013-07-09 LAB — PRO B NATRIURETIC PEPTIDE: Pro B Natriuretic peptide (BNP): 95.5 pg/mL (ref 0–125)

## 2013-07-09 MED ORDER — NITROGLYCERIN 0.4 MG SL SUBL
0.4000 mg | SUBLINGUAL_TABLET | Freq: Once | SUBLINGUAL | Status: AC
  Administered 2013-07-09: 0.4 mg via SUBLINGUAL

## 2013-07-09 MED ORDER — HYDROCODONE-ACETAMINOPHEN 5-325 MG PO TABS: 1.0000 | ORAL_TABLET | Freq: Four times a day (QID) | ORAL | Status: DC | PRN

## 2013-07-09 MED ORDER — SODIUM CHLORIDE 0.9 % IV BOLUS (SEPSIS)
1000.0000 mL | Freq: Once | INTRAVENOUS | Status: AC
  Administered 2013-07-09: 1000 mL via INTRAVENOUS

## 2013-07-09 MED ORDER — MORPHINE SULFATE 4 MG/ML IJ SOLN
4.0000 mg | Freq: Once | INTRAMUSCULAR | Status: AC
  Administered 2013-07-09: 4 mg via INTRAVENOUS
  Filled 2013-07-09: qty 1

## 2013-07-09 MED ORDER — OXYCODONE-ACETAMINOPHEN 5-325 MG PO TABS
2.0000 | ORAL_TABLET | Freq: Once | ORAL | Status: AC
  Administered 2013-07-09: 2 via ORAL
  Filled 2013-07-09: qty 2

## 2013-07-09 NOTE — Discharge Instructions (Signed)
Follow up with your primary doctor to discuss your symptoms and schedule an outpatient stress test. Take Norco as needed for pain. Return if symptoms worsen.  Chest Pain (Nonspecific) It is often hard to give a specific diagnosis for the cause of chest pain. There is always a chance that your pain could be related to something serious, such as a heart attack or a blood clot in the lungs. You need to follow up with your caregiver for further evaluation. CAUSES   Heartburn.  Pneumonia or bronchitis.  Anxiety or stress.  Inflammation around your heart (pericarditis) or lung (pleuritis or pleurisy).  A blood clot in the lung.  A collapsed lung (pneumothorax). It can develop suddenly on its own (spontaneous pneumothorax) or from injury (trauma) to the chest.  Shingles infection (herpes zoster virus). The chest wall is composed of bones, muscles, and cartilage. Any of these can be the source of the pain.  The bones can be bruised by injury.  The muscles or cartilage can be strained by coughing or overwork.  The cartilage can be affected by inflammation and become sore (costochondritis). DIAGNOSIS  Lab tests or other studies, such as X-rays, electrocardiography, stress testing, or cardiac imaging, may be needed to find the cause of your pain.  TREATMENT   Treatment depends on what may be causing your chest pain. Treatment may include:  Acid blockers for heartburn.  Anti-inflammatory medicine.  Pain medicine for inflammatory conditions.  Antibiotics if an infection is present.  You may be advised to change lifestyle habits. This includes stopping smoking and avoiding alcohol, caffeine, and chocolate.  You may be advised to keep your head raised (elevated) when sleeping. This reduces the chance of acid going backward from your stomach into your esophagus.  Most of the time, nonspecific chest pain will improve within 2 to 3 days with rest and mild pain medicine. HOME CARE  INSTRUCTIONS   If antibiotics were prescribed, take your antibiotics as directed. Finish them even if you start to feel better.  For the next few days, avoid physical activities that bring on chest pain. Continue physical activities as directed.  Do not smoke.  Avoid drinking alcohol.  Only take over-the-counter or prescription medicine for pain, discomfort, or fever as directed by your caregiver.  Follow your caregiver's suggestions for further testing if your chest pain does not go away.  Keep any follow-up appointments you made. If you do not go to an appointment, you could develop lasting (chronic) problems with pain. If there is any problem keeping an appointment, you must call to reschedule. SEEK MEDICAL CARE IF:   You think you are having problems from the medicine you are taking. Read your medicine instructions carefully.  Your chest pain does not go away, even after treatment.  You develop a rash with blisters on your chest. SEEK IMMEDIATE MEDICAL CARE IF:   You have increased chest pain or pain that spreads to your arm, neck, jaw, back, or abdomen.  You develop shortness of breath, an increasing cough, or you are coughing up blood.  You have severe back or abdominal pain, feel nauseous, or vomit.  You develop severe weakness, fainting, or chills.  You have a fever. THIS IS AN EMERGENCY. Do not wait to see if the pain will go away. Get medical help at once. Call your local emergency services (911 in [place] Do not drive yourself to the hospital. MAKE SURE YOU:   Understand these instructions.  Will watch your condition.  Will  get help right away if you are not doing well or get worse. Document Released: 01/17/2005 Document Revised: 07/02/2011 Document Reviewed: 11/13/2007 [facility] Patient Information 2014 [place].

## 2013-07-09 NOTE — ED Provider Notes (Signed)
CSN: [id number]     Arrival date & time 07/09/13  2022 History   None    Chief Complaint  Patient presents with  . Chest Pain     (Consider location/radiation/quality/duration/timing/severity/associated sxs/prior Treatment) HPI Comments: Patient is a 73 y/o male with a hx of HTN, HLD, morbid obesity, and asthma who presents for chest pain x 6 hours. Patient states pain began while seated watching TV. He is unable to describe his pain but states it is located in his lower substernal area and is nonradiating; he denies radiation when asked twice, despite nursing note describing radiation to L arm. Patient took 2 baby ASA at home for symptoms without relief. He received SL NTG en route via EMS with mild temporary relief. He also received another 2 baby ASA PTA. Patient states pain is worse with palpation to the chest wall and deep breath. Pain associated with dizziness and mild SOB. He denies experiencing pain of a similar nature in the past. FHx significant for fatal MI in father in his 74's. Patient denies fever, syncope, near syncope, cough, hemoptysis, leg swelling, N/V, numbness/tingling, and extremity weakness. Denies any recent medication changes. States he has had a 100lb intentional weight loss in the last few years.  PCP - Dr. Vista Lawman Patient does not have a cardiologist He endorses a stress test a few years ago which was normal.  Patient is a 73 y.o. male presenting with chest pain. The history is provided by the patient. No language interpreter was used.  Chest Pain Associated symptoms: dizziness and shortness of breath   Associated symptoms: no cough, no fever, no nausea, no numbness, not vomiting and no weakness     Past Medical History  Diagnosis Date  . Hypertension   . Asthma   . Obese    History reviewed. No pertinent past surgical history. No family history on file. History  Substance Use Topics  . Smoking status: Never Smoker   . Smokeless tobacco: Not on file  .  Alcohol Use: Yes     Comment: occasionally     Review of Systems  Constitutional: Negative for fever.  Respiratory: Positive for shortness of breath. Negative for cough.   Cardiovascular: Positive for chest pain. Negative for leg swelling.  Gastrointestinal: Negative for nausea and vomiting.  Neurological: Positive for dizziness. Negative for syncope, weakness and numbness.  All other systems reviewed and are negative.      Allergies  Review of patient's allergies indicates no known allergies.  Home Medications   Current Outpatient Rx  Name  Route  Sig  Dispense  Refill  . albuterol (PROVENTIL HFA;VENTOLIN HFA) 108 (90 BASE) MCG/ACT inhaler   Inhalation   Inhale 2 puffs into the lungs every 6 (six) hours as needed. For shortness of breath         . amLODipine (NORVASC) 10 MG tablet   Oral   Take 10 mg by mouth daily.           Marland Kitchen atorvastatin (LIPITOR) 20 MG tablet   Oral   Take 20 mg by mouth at bedtime.           . citalopram (CELEXA) 10 MG tablet   Oral   Take 10 mg by mouth daily.           . cloNIDine (CATAPRES) 0.3 MG tablet   Oral   Take 0.3 mg by mouth 2 (two) times daily.         . furosemide (LASIX) 80 MG  tablet   Oral   Take 80 mg by mouth 2 (two) times daily.           Marland Kitchen losartan (COZAAR) 50 MG tablet   Oral   Take 50 mg by mouth daily.         Marland Kitchen oxyCODONE-acetaminophen (PERCOCET/ROXICET) 5-325 MG per tablet   Oral   Take 1-2 tablets by mouth every 6 (six) hours as needed for pain.   20 tablet   0    BP 149/87  Pulse 99  Temp(Src) 97.8 F (36.6 C) (Oral)  Resp 19  SpO2 100%  Physical Exam  Nursing note and vitals reviewed. Constitutional: He is oriented to person, place, and time. He appears well-developed and well-nourished. No distress.  HENT:  Head: Normocephalic and atraumatic.  Mouth/Throat: Oropharynx is clear and moist. No oropharyngeal exudate.  Eyes: Conjunctivae and EOM are normal. Pupils are equal, round, and  reactive to light. No scleral icterus.  Neck: Normal range of motion.  Cardiovascular: Normal rate, regular rhythm, normal heart sounds and intact distal pulses.   Distal radial, dorsalis pedis, and posterior tibial pulses 2+ bilaterally  Pulmonary/Chest: Effort normal and breath sounds normal. No respiratory distress. He has no wheezes. He has no rales. He exhibits tenderness.  Abdominal: Soft. Normal appearance. There is no tenderness.  Musculoskeletal: Normal range of motion.  No pitting edema appreciated in b/l lower extremities.  Neurological: He is alert and oriented to person, place, and time.  GCS 15. Speech is goal oriented. Patient moves extremities without ataxia.  Skin: Skin is warm and dry. No rash noted. He is not diaphoretic. No erythema. No pallor.  Psychiatric: He has a normal mood and affect. His behavior is normal.    ED Course  Procedures (including critical care time) Labs Review Labs Reviewed  CBC WITH DIFFERENTIAL - Abnormal; Notable for the following:    WBC 14.9 (*)    HCT 38.4 (*)    Neutrophils Relative % 82 (*)    Lymphocytes Relative 9 (*)    Neutro Abs 12.4 (*)    All other components within normal limits  BASIC METABOLIC PANEL - Abnormal; Notable for the following:    Sodium 135 (*)    Glucose, Bld 129 (*)    GFR calc non Af Amer 84 (*)    All other components within normal limits  TROPONIN I  PRO B NATRIURETIC PEPTIDE   Imaging Review Dg Chest 2 View  07/09/2013   CLINICAL DATA:  Chest pain.  EXAM: CHEST  2 VIEW  COMPARISON:  Single view of the chest 12/29/2010. CT chest 06/06/2008.  FINDINGS: Lungs are clear. Heart size is upper normal. No pneumothorax or pleural effusion.  IMPRESSION: No acute disease.   Electronically Signed   By: Inge Rise M.D.   On: 07/09/2013 22:05     EKG Interpretation   Date/Time:  Thursday July 09 2013 20:29:39 EDT Ventricular Rate:  86 PR Interval:  180 QRS Duration: 87 QT Interval:  360 QTC Calculation:  430 R Axis:   -36 Text Interpretation:  Sinus rhythm Left axis deviation Low voltage,  precordial leads Confirmed by Christy Gentles  MD, Elenore Rota ([id number]) on 07/09/2013  8:35:20 PM      MDM   Final diagnoses:  Chest pain    73 year old male presents to the emergency department for chest pain. Patient with risk factors of morbid obesity, hypertension, and hyperlipidemia. Chest pain began at rest while watching TV. It has been significantly improved to  the point of resolution with morphine in ED as well as percocet PO. Symptoms pleuritic in nature, worse with deep inspiration as well as worsened with palpation to the chest wall. Cardiac work up today is negative.  Symptoms do not appear c/w ACS at this time. My suspicion is low for this especially in light of physical examination and reassuring work up today. Patient has no significant cardiac history despite risk factors. Also doubt PE in this patient given lack of tachycardia, tachypnea, dyspnea, and hypoxia. Well's PE score 0. I do not believe patient warrants inpatient work up today; he appears stable and appropriate for outpatient f/u and management.   Patient stable for d/c with instruction to f/u with his PCP for further evaluation of his symptoms and scheduling of a stress test. Will prescribe short course of Norco for pain control as needed. Return precautions provided and patient agreeable to plan with no unaddressed concerns.     Antonietta Breach, [place] 07/11/13 [id number]

## 2013-07-09 NOTE — ED Provider Notes (Signed)
Patient seen/examined in the Emergency Department in conjunction with Midlevel Provider [facility] Patient reports chest pain Exam : awake/alert, no distress, CP is reproducible on exam Plan: d/c home.  Low suspicion for ACS/PE/Dissection at this time   Sharyon Cable, MD 07/09/13 [id number]

## 2013-07-09 NOTE — ED Notes (Signed)
Pt returned from xray- placed back on cardiac monitor

## 2013-07-09 NOTE — ED Notes (Signed)
To ED from home via [facility], chest pain onset 1700 at rest, central with radiation to left arm, 324 ASA pta, 2 nitro pta, pain from 8 to 6/10, 22g right hand, VSS, 146/82,

## 2013-07-11 NOTE — ED Provider Notes (Signed)
Medical screening examination/treatment/procedure(s) were conducted as a shared visit with non-physician practitioner(s) and myself.  I personally evaluated the patient during the encounter.   EKG Interpretation   Date/Time:  Thursday July 09 2013 23:50:10 EDT Ventricular Rate:  82 PR Interval:  178 QRS Duration: 88 QT Interval:  358 QTC Calculation: 418 R Axis:   -17 Text Interpretation:  Sinus rhythm Borderline left axis deviation Low  voltage, precordial leads ED PHYSICIAN INTERPRETATION AVAILABLE IN [facility] Confirmed by TEST, Record ([id number]) on 07/11/2013 9:36:22 AM        Sharyon Cable, MD 07/11/13 [id number]

## 2013-10-01 DIAGNOSIS — E785 Hyperlipidemia, unspecified: Secondary | ICD-10-CM | POA: Diagnosis not present

## 2013-10-01 DIAGNOSIS — I509 Heart failure, unspecified: Secondary | ICD-10-CM | POA: Diagnosis not present

## 2013-10-01 DIAGNOSIS — J45909 Unspecified asthma, uncomplicated: Secondary | ICD-10-CM | POA: Diagnosis not present

## 2013-10-01 DIAGNOSIS — F3289 Other specified depressive episodes: Secondary | ICD-10-CM | POA: Diagnosis not present

## 2013-10-01 DIAGNOSIS — L259 Unspecified contact dermatitis, unspecified cause: Secondary | ICD-10-CM | POA: Diagnosis not present

## 2013-10-01 DIAGNOSIS — F329 Major depressive disorder, single episode, unspecified: Secondary | ICD-10-CM | POA: Diagnosis not present

## 2013-10-01 DIAGNOSIS — G894 Chronic pain syndrome: Secondary | ICD-10-CM | POA: Diagnosis not present

## 2013-10-01 DIAGNOSIS — R7301 Impaired fasting glucose: Secondary | ICD-10-CM | POA: Diagnosis not present

## 2013-10-01 DIAGNOSIS — I10 Essential (primary) hypertension: Secondary | ICD-10-CM | POA: Diagnosis not present

## 2013-12-10 DIAGNOSIS — F3289 Other specified depressive episodes: Secondary | ICD-10-CM | POA: Diagnosis not present

## 2013-12-10 DIAGNOSIS — F329 Major depressive disorder, single episode, unspecified: Secondary | ICD-10-CM | POA: Diagnosis not present

## 2013-12-10 DIAGNOSIS — R7301 Impaired fasting glucose: Secondary | ICD-10-CM | POA: Diagnosis not present

## 2013-12-10 DIAGNOSIS — J45909 Unspecified asthma, uncomplicated: Secondary | ICD-10-CM | POA: Diagnosis not present

## 2013-12-10 DIAGNOSIS — L259 Unspecified contact dermatitis, unspecified cause: Secondary | ICD-10-CM | POA: Diagnosis not present

## 2013-12-10 DIAGNOSIS — I10 Essential (primary) hypertension: Secondary | ICD-10-CM | POA: Diagnosis not present

## 2013-12-10 DIAGNOSIS — I509 Heart failure, unspecified: Secondary | ICD-10-CM | POA: Diagnosis not present

## 2013-12-10 DIAGNOSIS — E785 Hyperlipidemia, unspecified: Secondary | ICD-10-CM | POA: Diagnosis not present

## 2014-03-03 DIAGNOSIS — I10 Essential (primary) hypertension: Secondary | ICD-10-CM | POA: Diagnosis not present

## 2014-03-03 DIAGNOSIS — J45909 Unspecified asthma, uncomplicated: Secondary | ICD-10-CM | POA: Diagnosis not present

## 2014-03-03 DIAGNOSIS — I509 Heart failure, unspecified: Secondary | ICD-10-CM | POA: Diagnosis not present

## 2014-03-03 DIAGNOSIS — R7309 Other abnormal glucose: Secondary | ICD-10-CM | POA: Diagnosis not present

## 2014-03-03 DIAGNOSIS — E785 Hyperlipidemia, unspecified: Secondary | ICD-10-CM | POA: Diagnosis not present

## 2014-03-03 DIAGNOSIS — F329 Major depressive disorder, single episode, unspecified: Secondary | ICD-10-CM | POA: Diagnosis not present

## 2014-05-21 DIAGNOSIS — J45909 Unspecified asthma, uncomplicated: Secondary | ICD-10-CM | POA: Diagnosis not present

## 2014-05-21 DIAGNOSIS — R7309 Other abnormal glucose: Secondary | ICD-10-CM | POA: Diagnosis not present

## 2014-05-21 DIAGNOSIS — I509 Heart failure, unspecified: Secondary | ICD-10-CM | POA: Diagnosis not present

## 2014-05-21 DIAGNOSIS — I10 Essential (primary) hypertension: Secondary | ICD-10-CM | POA: Diagnosis not present

## 2014-05-21 DIAGNOSIS — E785 Hyperlipidemia, unspecified: Secondary | ICD-10-CM | POA: Diagnosis not present

## 2014-05-21 DIAGNOSIS — F329 Major depressive disorder, single episode, unspecified: Secondary | ICD-10-CM | POA: Diagnosis not present

## 2014-10-08 DIAGNOSIS — I509 Heart failure, unspecified: Secondary | ICD-10-CM | POA: Diagnosis not present

## 2014-10-08 DIAGNOSIS — J45909 Unspecified asthma, uncomplicated: Secondary | ICD-10-CM | POA: Diagnosis not present

## 2014-10-08 DIAGNOSIS — R062 Wheezing: Secondary | ICD-10-CM | POA: Diagnosis not present

## 2014-10-08 DIAGNOSIS — R7309 Other abnormal glucose: Secondary | ICD-10-CM | POA: Diagnosis not present

## 2014-10-08 DIAGNOSIS — E785 Hyperlipidemia, unspecified: Secondary | ICD-10-CM | POA: Diagnosis not present

## 2014-10-08 DIAGNOSIS — F329 Major depressive disorder, single episode, unspecified: Secondary | ICD-10-CM | POA: Diagnosis not present

## 2014-10-08 DIAGNOSIS — I10 Essential (primary) hypertension: Secondary | ICD-10-CM | POA: Diagnosis not present

## 2014-10-08 DIAGNOSIS — E559 Vitamin D deficiency, unspecified: Secondary | ICD-10-CM | POA: Diagnosis not present

## 2015-04-05 DIAGNOSIS — Z01118 Encounter for examination of ears and hearing with other abnormal findings: Secondary | ICD-10-CM | POA: Diagnosis not present

## 2015-04-05 DIAGNOSIS — F329 Major depressive disorder, single episode, unspecified: Secondary | ICD-10-CM | POA: Diagnosis not present

## 2015-04-05 DIAGNOSIS — Z0101 Encounter for examination of eyes and vision with abnormal findings: Secondary | ICD-10-CM | POA: Diagnosis not present

## 2015-04-05 DIAGNOSIS — I509 Heart failure, unspecified: Secondary | ICD-10-CM | POA: Diagnosis not present

## 2015-04-05 DIAGNOSIS — J45909 Unspecified asthma, uncomplicated: Secondary | ICD-10-CM | POA: Diagnosis not present

## 2015-04-05 DIAGNOSIS — E785 Hyperlipidemia, unspecified: Secondary | ICD-10-CM | POA: Diagnosis not present

## 2015-04-05 DIAGNOSIS — I10 Essential (primary) hypertension: Secondary | ICD-10-CM | POA: Diagnosis not present

## 2015-04-05 DIAGNOSIS — Z0000 Encounter for general adult medical examination without abnormal findings: Secondary | ICD-10-CM | POA: Diagnosis not present

## 2015-06-22 DIAGNOSIS — I509 Heart failure, unspecified: Secondary | ICD-10-CM | POA: Diagnosis not present

## 2015-06-22 DIAGNOSIS — F329 Major depressive disorder, single episode, unspecified: Secondary | ICD-10-CM | POA: Diagnosis not present

## 2015-06-22 DIAGNOSIS — R7303 Prediabetes: Secondary | ICD-10-CM | POA: Diagnosis not present

## 2015-06-22 DIAGNOSIS — J45909 Unspecified asthma, uncomplicated: Secondary | ICD-10-CM | POA: Diagnosis not present

## 2015-06-22 DIAGNOSIS — I10 Essential (primary) hypertension: Secondary | ICD-10-CM | POA: Diagnosis not present

## 2015-06-22 DIAGNOSIS — R062 Wheezing: Secondary | ICD-10-CM | POA: Diagnosis not present

## 2015-06-22 DIAGNOSIS — E559 Vitamin D deficiency, unspecified: Secondary | ICD-10-CM | POA: Diagnosis not present

## 2015-06-22 DIAGNOSIS — E785 Hyperlipidemia, unspecified: Secondary | ICD-10-CM | POA: Diagnosis not present

## 2015-08-26 DIAGNOSIS — E559 Vitamin D deficiency, unspecified: Secondary | ICD-10-CM | POA: Diagnosis not present

## 2015-08-26 DIAGNOSIS — I509 Heart failure, unspecified: Secondary | ICD-10-CM | POA: Diagnosis not present

## 2015-08-26 DIAGNOSIS — E785 Hyperlipidemia, unspecified: Secondary | ICD-10-CM | POA: Diagnosis not present

## 2015-08-26 DIAGNOSIS — R7303 Prediabetes: Secondary | ICD-10-CM | POA: Diagnosis not present

## 2015-08-26 DIAGNOSIS — L298 Other pruritus: Secondary | ICD-10-CM | POA: Diagnosis not present

## 2015-08-26 DIAGNOSIS — F329 Major depressive disorder, single episode, unspecified: Secondary | ICD-10-CM | POA: Diagnosis not present

## 2015-08-26 DIAGNOSIS — I10 Essential (primary) hypertension: Secondary | ICD-10-CM | POA: Diagnosis not present

## 2015-08-26 DIAGNOSIS — J45909 Unspecified asthma, uncomplicated: Secondary | ICD-10-CM | POA: Diagnosis not present

## 2015-10-06 ENCOUNTER — Institutional Professional Consult (permissible substitution): Payer: Medicare Other | Admitting: Pulmonary Disease

## 2015-10-24 ENCOUNTER — Encounter (HOSPITAL_COMMUNITY): Payer: Self-pay

## 2015-10-24 ENCOUNTER — Emergency Department (HOSPITAL_COMMUNITY): Payer: Medicare Other

## 2015-10-24 ENCOUNTER — Inpatient Hospital Stay (HOSPITAL_COMMUNITY)
Admission: EM | Admit: 2015-10-24 | Discharge: 2015-10-28 | DRG: 871 | Disposition: A | Discharge status: Home / Self Care | Payer: Medicare Other | Attending: Internal Medicine | Admitting: Internal Medicine

## 2015-10-24 DIAGNOSIS — Z6841 Body Mass Index (BMI) 40.0 and over, adult: Secondary | ICD-10-CM | POA: Diagnosis not present

## 2015-10-24 DIAGNOSIS — Z79899 Other long term (current) drug therapy: Secondary | ICD-10-CM

## 2015-10-24 DIAGNOSIS — R0602 Shortness of breath: Secondary | ICD-10-CM | POA: Diagnosis not present

## 2015-10-24 DIAGNOSIS — N179 Acute kidney failure, unspecified: Secondary | ICD-10-CM | POA: Diagnosis present

## 2015-10-24 DIAGNOSIS — I5033 Acute on chronic diastolic (congestive) heart failure: Secondary | ICD-10-CM | POA: Diagnosis present

## 2015-10-24 DIAGNOSIS — I110 Hypertensive heart disease with heart failure: Secondary | ICD-10-CM | POA: Diagnosis present

## 2015-10-24 DIAGNOSIS — J9621 Acute and chronic respiratory failure with hypoxia: Secondary | ICD-10-CM | POA: Diagnosis present

## 2015-10-24 DIAGNOSIS — J45901 Unspecified asthma with (acute) exacerbation: Secondary | ICD-10-CM | POA: Diagnosis present

## 2015-10-24 DIAGNOSIS — J189 Pneumonia, unspecified organism: Secondary | ICD-10-CM | POA: Diagnosis not present

## 2015-10-24 DIAGNOSIS — A419 Sepsis, unspecified organism: Secondary | ICD-10-CM | POA: Diagnosis not present

## 2015-10-24 DIAGNOSIS — T380X5A Adverse effect of glucocorticoids and synthetic analogues, initial encounter: Secondary | ICD-10-CM | POA: Diagnosis present

## 2015-10-24 DIAGNOSIS — F329 Major depressive disorder, single episode, unspecified: Secondary | ICD-10-CM | POA: Diagnosis present

## 2015-10-24 DIAGNOSIS — F32A Depression, unspecified: Secondary | ICD-10-CM | POA: Diagnosis present

## 2015-10-24 DIAGNOSIS — I878 Other specified disorders of veins: Secondary | ICD-10-CM | POA: Diagnosis present

## 2015-10-24 DIAGNOSIS — J811 Chronic pulmonary edema: Secondary | ICD-10-CM | POA: Insufficient documentation

## 2015-10-24 DIAGNOSIS — E785 Hyperlipidemia, unspecified: Secondary | ICD-10-CM | POA: Diagnosis present

## 2015-10-24 DIAGNOSIS — R739 Hyperglycemia, unspecified: Secondary | ICD-10-CM | POA: Diagnosis present

## 2015-10-24 DIAGNOSIS — R0600 Dyspnea, unspecified: Secondary | ICD-10-CM

## 2015-10-24 DIAGNOSIS — G4733 Obstructive sleep apnea (adult) (pediatric): Secondary | ICD-10-CM | POA: Diagnosis present

## 2015-10-24 DIAGNOSIS — E871 Hypo-osmolality and hyponatremia: Secondary | ICD-10-CM | POA: Diagnosis present

## 2015-10-24 DIAGNOSIS — I2510 Atherosclerotic heart disease of native coronary artery without angina pectoris: Secondary | ICD-10-CM | POA: Diagnosis present

## 2015-10-24 DIAGNOSIS — Z9981 Dependence on supplemental oxygen: Secondary | ICD-10-CM

## 2015-10-24 DIAGNOSIS — J45909 Unspecified asthma, uncomplicated: Secondary | ICD-10-CM | POA: Diagnosis present

## 2015-10-24 DIAGNOSIS — I10 Essential (primary) hypertension: Secondary | ICD-10-CM | POA: Diagnosis present

## 2015-10-24 HISTORY — DX: Other specified disorders of veins: I87.8

## 2015-10-24 HISTORY — DX: Obstructive sleep apnea (adult) (pediatric): G47.33

## 2015-10-24 HISTORY — DX: Chronic respiratory failure, unspecified whether with hypoxia or hypercapnia: J96.10

## 2015-10-24 HISTORY — DX: Hyperlipidemia, unspecified: E78.5

## 2015-10-24 LAB — I-STAT TROPONIN, ED: Troponin i, poc: 0.02 ng/mL (ref 0.00–0.08)

## 2015-10-24 MED ORDER — SODIUM CHLORIDE 0.9 % IV BOLUS (SEPSIS)
1000.0000 mL | Freq: Once | INTRAVENOUS | Status: AC
  Administered 2015-10-24: 1000 mL via INTRAVENOUS

## 2015-10-24 MED ORDER — ACETAMINOPHEN 500 MG PO TABS
1000.0000 mg | ORAL_TABLET | Freq: Once | ORAL | Status: AC
  Administered 2015-10-25: 1000 mg via ORAL
  Filled 2015-10-24: qty 2

## 2015-10-24 NOTE — ED Notes (Signed)
Pt comes from home via [facility], c/o SOB since 10am, PTA received 5 albuterol, 0.5 atrovent and 125 solumedrol. Diminished  lower lobes, with recent cough with white sputum.

## 2015-10-24 NOTE — ED Provider Notes (Signed)
CSN: [id number]     Arrival date & time 10/24/15  2303 History  By signing my name below, I, [facility], attest that this documentation has been prepared under the direction and in the presence of Everlene Balls, MD. Electronically Signed: Virgel Bouquet, ED Scribe. 10/25/2015. 12:58 AM.   Chief Complaint  Patient presents with  . Shortness of Breath   The history is provided by the patient. No language interpreter was used.   HPI Comments: Brian Silva is a 75 y.o. male with a hx of HTN and asthma who presents to the Emergency Department complaining of intermittent, moderate, unchanging SOB onset 13 hours ago. Pt states that he has a hx of asthma and that he has had difficulty breathing all day. He reports associated cough productive of phlegm, subjective fevers, and night diaphoresis. He used albuterol this morning without relief. Denies home O2 use. Denies any pain or other symptoms currently.  Past Medical History  Diagnosis Date  . Hypertension   . Asthma   . Obese    History reviewed. No pertinent past surgical history. No family history on file. Social History  Substance Use Topics  . Smoking status: Never Smoker   . Smokeless tobacco: None  . Alcohol Use: Yes     Comment: occasionally     Review of Systems A complete 10 system review of systems was obtained and all systems are negative except as noted in the HPI and PMH.    Allergies  Review of patient's allergies indicates no known allergies.  Home Medications   Prior to Admission medications   Medication Sig Start Date End Date Taking? Authorizing Provider  albuterol (PROVENTIL HFA;VENTOLIN HFA) 108 (90 BASE) MCG/ACT inhaler Inhale 2 puffs into the lungs every 6 (six) hours as needed. For shortness of breath    Historical Provider, MD  albuterol (PROVENTIL) (2.5 MG/3ML) 0.083% nebulizer solution Take 2.5 mg by nebulization every 4 (four) hours as needed for wheezing or shortness of breath.    Historical  Provider, MD  amLODipine (NORVASC) 10 MG tablet Take 10 mg by mouth daily.      Historical Provider, MD  atorvastatin (LIPITOR) 20 MG tablet Take 20 mg by mouth at bedtime.      Historical Provider, MD  citalopram (CELEXA) 10 MG tablet Take 10 mg by mouth daily.      Historical Provider, MD  cloNIDine (CATAPRES) 0.3 MG tablet Take 0.3 mg by mouth daily.     Historical Provider, MD  furosemide (LASIX) 80 MG tablet Take 80 mg by mouth daily.     Historical Provider, MD  HYDROcodone-acetaminophen (NORCO/VICODIN) 5-325 MG per tablet Take 1-2 tablets by mouth every 6 (six) hours as needed. 07/09/13   Antonietta Breach, PA-C  losartan (COZAAR) 50 MG tablet Take 50 mg by mouth daily.    Historical Provider, MD   BP 149/52 mmHg  Pulse 111  Temp(Src) 102.7 F (39.3 C) (Rectal)  Resp 20  Ht 5\' 10"  (1.778 m)  Wt 401 lb (181.892 kg)  BMI 57.54 kg/m2  SpO2 93% Physical Exam  Constitutional: He is oriented to person, place, and time. Vital signs are normal. He appears well-developed and well-nourished.  Non-toxic appearance. He does not appear ill. No distress. Nasal cannula in place.  Obese male. Tactile fever. Nasal canula is place.  HENT:  Head: Normocephalic and atraumatic.  Nose: Nose normal.  Mouth/Throat: Oropharynx is clear and moist. No oropharyngeal exudate.  Eyes: Conjunctivae and EOM are normal. Pupils are  equal, round, and reactive to light. No scleral icterus.  Neck: Normal range of motion. Neck supple. No tracheal deviation, no edema, no erythema and normal range of motion present. No thyroid mass and no thyromegaly present.  Cardiovascular: Regular rhythm, S1 normal, S2 normal, normal heart sounds, intact distal pulses and normal pulses.  Tachycardia present.  Exam reveals no gallop and no friction rub.   No murmur heard. Pulmonary/Chest: Effort normal and breath sounds normal. No respiratory distress. He has no wheezes. He has no rhonchi. He has no rales.  Lungs CTA.  Abdominal: Soft.  Normal appearance and bowel sounds are normal. He exhibits no distension, no ascites and no mass. There is no hepatosplenomegaly. There is no tenderness. There is no rebound, no guarding and no CVA tenderness.  Musculoskeletal: Normal range of motion. He exhibits no edema or tenderness.  Lymphadenopathy:    He has no cervical adenopathy.  Neurological: He is alert and oriented to person, place, and time. He has normal strength. No cranial nerve deficit or sensory deficit.  Skin: Skin is warm, dry and intact. No petechiae and no rash noted. He is not diaphoretic. No erythema. No pallor.  Psychiatric: He has a normal mood and affect. His behavior is normal. Judgment normal.  Nursing note and vitals reviewed.   ED Course  Procedures (including critical care time)  DIAGNOSTIC STUDIES: Oxygen Saturation is 93% on RA, normal by my interpretation.    COORDINATION OF CARE: 11:09 PM Will order IV fluids, chest x-ray, and labs. Discussed treatment plan with pt at bedside and pt agreed to plan.  12:58 AM Consulted with hospitalist Dr. Tamala Julian who agreed to step-down admission for the pt. Will return to discuss treatment plan with pt.  Labs Review Labs Reviewed  CBC WITH DIFFERENTIAL/PLATELET - Abnormal; Notable for the following:    Hemoglobin 12.0 (*)    HCT 36.5 (*)    All other components within normal limits  BASIC METABOLIC PANEL - Abnormal; Notable for the following:    Sodium 129 (*)    Chloride 98 (*)    CO2 21 (*)    Glucose, Bld 174 (*)    Creatinine, Ser 1.33 (*)    Calcium 8.1 (*)    GFR calc non Af Amer 52 (*)    All other components within normal limits  URINALYSIS, ROUTINE W REFLEX MICROSCOPIC (NOT AT [facility]) - Abnormal; Notable for the following:    Hgb urine dipstick SMALL (*)    All other components within normal limits  URINE MICROSCOPIC-ADD ON - Abnormal; Notable for the following:    Squamous Epithelial / LPF 0-5 (*)    Bacteria, UA RARE (*)    Casts HYALINE CASTS (*)     All other components within normal limits  CULTURE, BLOOD (ROUTINE X 2)  CULTURE, BLOOD (ROUTINE X 2)  URINE CULTURE  BRAIN NATRIURETIC PEPTIDE  I-STAT TROPOININ, ED  I-STAT CG4 LACTIC ACID, ED    Imaging Review Dg Chest 2 View  10/25/2015  CLINICAL DATA:  Shortness breath, congestion today. History asthma, hypertension and obesity. EXAM: CHEST  2 VIEW COMPARISON:  Chest radiograph July 09, 2013 FINDINGS: Habitus limited examination. Cardiac silhouette appears mild to moderately enlarged. Mediastinal silhouette is nonsuspicious, calcified aortic knob. Mild pulmonary vascular congestion without pleural effusion or focal consolidation the posterior costophrenic angle is not included in field of view. No pneumothorax. Osseous structures are nonsuspicious. IMPRESSION: Cardiomegaly and pulmonary vascular congestion. Electronically Signed   By: Thana Farr.D.  On: 10/25/2015 00:11   I have personally reviewed and evaluated these images and lab results as part of my medical decision-making.   EKG Interpretation   Date/Time:  Monday October 24 2015 23:30:19 EDT Ventricular Rate:  102 PR Interval:    QRS Duration: 87 QT Interval:  320 QTC Calculation: 417 R Axis:   -70 Text Interpretation:  Sinus tachycardia Inferior infarct, old Consider  anterior infarct No significant change since last tracing Confirmed by  Glynn Octave [phone]) on 10/24/2015 11:50:50 PM      MDM   Final diagnoses:  None    Patient presents emergency department for worsening shortness of breath and productive cough. He has had subjective fevers as well. Vital signs are concerning with fever and tachycardia. Chest x-ray was obtained and reveals only pulmonary vascular congestion, concerning for heart failure. However in this clinical setting of also concern for pneumonia that may not be able to be seen. Blood cultures were drawn, he was given ceftriaxone azithromycin. Lactate is normal at 1.5. Patient is  hyponatremic, 129. I spoke with Dr. Tamala Julian who accepts the patient to stepdown for further care. Patient remains on oxygen for his initial hypoxia.   I personally performed the services described in this documentation, which was scribed in my presence. The recorded information has been reviewed and is accurate.      Everlene Balls, MD 10/25/15 [phone]

## 2015-10-25 ENCOUNTER — Inpatient Hospital Stay (HOSPITAL_COMMUNITY): Payer: Medicare Other

## 2015-10-25 DIAGNOSIS — Z6841 Body Mass Index (BMI) 40.0 and over, adult: Secondary | ICD-10-CM | POA: Diagnosis not present

## 2015-10-25 DIAGNOSIS — N179 Acute kidney failure, unspecified: Secondary | ICD-10-CM | POA: Diagnosis present

## 2015-10-25 DIAGNOSIS — J9621 Acute and chronic respiratory failure with hypoxia: Secondary | ICD-10-CM | POA: Diagnosis present

## 2015-10-25 DIAGNOSIS — I110 Hypertensive heart disease with heart failure: Secondary | ICD-10-CM | POA: Diagnosis present

## 2015-10-25 DIAGNOSIS — G4733 Obstructive sleep apnea (adult) (pediatric): Secondary | ICD-10-CM | POA: Diagnosis present

## 2015-10-25 DIAGNOSIS — F32A Depression, unspecified: Secondary | ICD-10-CM | POA: Diagnosis present

## 2015-10-25 DIAGNOSIS — J9811 Atelectasis: Secondary | ICD-10-CM | POA: Diagnosis not present

## 2015-10-25 DIAGNOSIS — J189 Pneumonia, unspecified organism: Secondary | ICD-10-CM | POA: Insufficient documentation

## 2015-10-25 DIAGNOSIS — F329 Major depressive disorder, single episode, unspecified: Secondary | ICD-10-CM

## 2015-10-25 DIAGNOSIS — I5033 Acute on chronic diastolic (congestive) heart failure: Secondary | ICD-10-CM | POA: Diagnosis not present

## 2015-10-25 DIAGNOSIS — E785 Hyperlipidemia, unspecified: Secondary | ICD-10-CM | POA: Diagnosis present

## 2015-10-25 DIAGNOSIS — Z79899 Other long term (current) drug therapy: Secondary | ICD-10-CM | POA: Diagnosis not present

## 2015-10-25 DIAGNOSIS — R0602 Shortness of breath: Secondary | ICD-10-CM | POA: Diagnosis not present

## 2015-10-25 DIAGNOSIS — J811 Chronic pulmonary edema: Secondary | ICD-10-CM | POA: Diagnosis present

## 2015-10-25 DIAGNOSIS — R739 Hyperglycemia, unspecified: Secondary | ICD-10-CM | POA: Diagnosis present

## 2015-10-25 DIAGNOSIS — J45901 Unspecified asthma with (acute) exacerbation: Secondary | ICD-10-CM

## 2015-10-25 DIAGNOSIS — J810 Acute pulmonary edema: Secondary | ICD-10-CM | POA: Diagnosis not present

## 2015-10-25 DIAGNOSIS — T380X5A Adverse effect of glucocorticoids and synthetic analogues, initial encounter: Secondary | ICD-10-CM | POA: Diagnosis present

## 2015-10-25 DIAGNOSIS — I2510 Atherosclerotic heart disease of native coronary artery without angina pectoris: Secondary | ICD-10-CM | POA: Diagnosis present

## 2015-10-25 DIAGNOSIS — A419 Sepsis, unspecified organism: Secondary | ICD-10-CM | POA: Diagnosis present

## 2015-10-25 DIAGNOSIS — Z9981 Dependence on supplemental oxygen: Secondary | ICD-10-CM | POA: Diagnosis not present

## 2015-10-25 DIAGNOSIS — E871 Hypo-osmolality and hyponatremia: Secondary | ICD-10-CM | POA: Diagnosis present

## 2015-10-25 DIAGNOSIS — I878 Other specified disorders of veins: Secondary | ICD-10-CM | POA: Diagnosis present

## 2015-10-25 DIAGNOSIS — I509 Heart failure, unspecified: Secondary | ICD-10-CM | POA: Diagnosis not present

## 2015-10-25 DIAGNOSIS — I10 Essential (primary) hypertension: Secondary | ICD-10-CM | POA: Diagnosis not present

## 2015-10-25 DIAGNOSIS — I5032 Chronic diastolic (congestive) heart failure: Secondary | ICD-10-CM | POA: Diagnosis not present

## 2015-10-25 LAB — CBC WITH DIFFERENTIAL/PLATELET
Basophils Absolute: 0 10*3/uL (ref 0.0–0.1)
Basophils Absolute: 0 10*3/uL (ref 0.0–0.1)
Basophils Relative: 1 %
Basophils Relative: 0 %
Eosinophils Absolute: 0 10*3/uL (ref 0.0–0.7)
Eosinophils Absolute: 0 10*3/uL (ref 0.0–0.7)
Eosinophils Relative: 0 %
Eosinophils Relative: 0 %
HCT: 36.5 % — ABNORMAL LOW (ref 39.0–52.0)
HCT: 38.8 % — ABNORMAL LOW (ref 39.0–52.0)
Hemoglobin: 12 g/dL — ABNORMAL LOW (ref 13.0–17.0)
Hemoglobin: 12.7 g/dL — ABNORMAL LOW (ref 13.0–17.0)
Lymphocytes Relative: 8 %
Lymphocytes Relative: 5 %
Lymphs Abs: 0.7 10*3/uL (ref 0.7–4.0)
Lymphs Abs: 0.4 10*3/uL — ABNORMAL LOW (ref 0.7–4.0)
MCH: 27.5 pg (ref 26.0–34.0)
MCH: 27.4 pg (ref 26.0–34.0)
MCHC: 32.9 g/dL (ref 30.0–36.0)
MCHC: 32.7 g/dL (ref 30.0–36.0)
MCV: 83.5 fL (ref 78.0–100.0)
MCV: 83.6 fL (ref 78.0–100.0)
Monocytes Absolute: 0.7 10*3/uL (ref 0.1–1.0)
Monocytes Absolute: 0.1 10*3/uL (ref 0.1–1.0)
Monocytes Relative: 9 %
Monocytes Relative: 1 %
Neutro Abs: 7 10*3/uL (ref 1.7–7.7)
Neutro Abs: 7.8 10*3/uL — ABNORMAL HIGH (ref 1.7–7.7)
Neutrophils Relative %: 82 %
Neutrophils Relative %: 94 %
Platelets: 260 10*3/uL (ref 150–400)
Platelets: 278 10*3/uL (ref 150–400)
RBC: 4.37 MIL/uL (ref 4.22–5.81)
RBC: 4.64 MIL/uL (ref 4.22–5.81)
RDW: 14.6 % (ref 11.5–15.5)
RDW: 14.7 % (ref 11.5–15.5)
WBC: 8.5 10*3/uL (ref 4.0–10.5)
WBC: 8.3 10*3/uL (ref 4.0–10.5)

## 2015-10-25 LAB — BASIC METABOLIC PANEL
Anion gap: 10 (ref 5–15)
Anion gap: 11 (ref 5–15)
BUN: 15 mg/dL (ref 6–20)
BUN: 17 mg/dL (ref 6–20)
CO2: 21 mmol/L — ABNORMAL LOW (ref 22–32)
CO2: 21 mmol/L — ABNORMAL LOW (ref 22–32)
Calcium: 8.1 mg/dL — ABNORMAL LOW (ref 8.9–10.3)
Calcium: 8.2 mg/dL — ABNORMAL LOW (ref 8.9–10.3)
Chloride: 98 mmol/L — ABNORMAL LOW (ref 101–111)
Chloride: 97 mmol/L — ABNORMAL LOW (ref 101–111)
Creatinine, Ser: 1.33 mg/dL — ABNORMAL HIGH (ref 0.61–1.24)
Creatinine, Ser: 1.31 mg/dL — ABNORMAL HIGH (ref 0.61–1.24)
GFR calc Af Amer: >60 mL/min (ref >60)
GFR calc Af Amer: >60 mL/min (ref >60)
GFR calc non Af Amer: 52 mL/min — ABNORMAL LOW (ref >60)
GFR calc non Af Amer: 53 mL/min — ABNORMAL LOW (ref >60)
Glucose, Bld: 174 mg/dL — ABNORMAL HIGH (ref 65–99)
Glucose, Bld: 305 mg/dL — ABNORMAL HIGH (ref 65–99)
Potassium: 3.7 mmol/L (ref 3.5–5.1)
Potassium: 3.6 mmol/L (ref 3.5–5.1)
Sodium: 129 mmol/L — ABNORMAL LOW (ref 135–145)
Sodium: 129 mmol/L — ABNORMAL LOW (ref 135–145)

## 2015-10-25 LAB — URINALYSIS, ROUTINE W REFLEX MICROSCOPIC
Bilirubin Urine: NEGATIVE
Glucose, UA: NEGATIVE mg/dL
Ketones, ur: NEGATIVE mg/dL
Leukocytes, UA: NEGATIVE
Nitrite: NEGATIVE
Protein, ur: NEGATIVE mg/dL
Specific Gravity, Urine: 1.01 (ref 1.005–1.030)
pH: 5.5 (ref 5.0–8.0)

## 2015-10-25 LAB — ECHOCARDIOGRAM COMPLETE
E decel time: 296 msec
E/e' ratio: 12.32
FS: 30 % (ref 28–44)
Height: 70 in
IVS/LV PW RATIO, ED: 0.9
LA ID, A-P, ES: 38 mm
LA diam end sys: 38 mm
LA diam index: 1.23 cm/m2
LA vol A4C: 53.3 ml
LA vol index: 25.1 mL/m2
LA vol: 77.9 mL
LV E/e' medial: 12.32
LV E/e'average: 12.32
LV PW d: 15.9 mm — AB (ref 0.6–1.1)
LV dias vol index: 33 mL/m2
LV dias vol: 103 mL (ref 62–150)
LV e' LATERAL: 9.25 cm/s
LV sys vol index: 18 mL/m2
LV sys vol: 55 mL (ref 21–61)
LVOT SV: 128 mL
LVOT VTI: 28.4 cm
LVOT area: 4.52 cm2
LVOT diameter: 24 mm
LVOT peak grad rest: 6 mmHg
LVOT peak vel: 126 cm/s
MV Dec: 296
MV Peak grad: 5 mmHg
MV pk A vel: 130 m/s
MV pk E vel: 114 m/s
Simpson's disk: 47
Stroke v: 48 ml
TDI e' lateral: 9.25
TDI e' medial: 7.4
Weight: 6384.52 oz

## 2015-10-25 LAB — URINE MICROSCOPIC-ADD ON

## 2015-10-25 LAB — I-STAT CG4 LACTIC ACID, ED: Lactic Acid, Venous: 1.56 mmol/L (ref 0.5–1.9)

## 2015-10-25 LAB — SODIUM, URINE, RANDOM: Sodium, Ur: 38 mmol/L

## 2015-10-25 LAB — CREATININE, URINE, RANDOM: Creatinine, Urine: 46 mg/dL

## 2015-10-25 LAB — MRSA PCR SCREENING: MRSA by PCR: NEGATIVE

## 2015-10-25 LAB — BRAIN NATRIURETIC PEPTIDE: B Natriuretic Peptide: 66.7 pg/mL (ref 0.0–100.0)

## 2015-10-25 LAB — OSMOLALITY, URINE: Osmolality, Ur: 303 mOsm/kg (ref 300–900)

## 2015-10-25 LAB — OSMOLALITY: Osmolality: 292 mOsm/kg (ref 275–295)

## 2015-10-25 LAB — STREP PNEUMONIAE URINARY ANTIGEN: Strep Pneumo Urinary Antigen: NEGATIVE

## 2015-10-25 MED ORDER — CLONIDINE HCL 0.1 MG PO TABS
0.3000 mg | ORAL_TABLET | Freq: Every day | ORAL | Status: DC
  Administered 2015-10-25 – 2015-10-26 (×2): 0.3 mg via ORAL
  Filled 2015-10-25 (×2): qty 3

## 2015-10-25 MED ORDER — GUAIFENESIN ER 600 MG PO TB12
600.0000 mg | ORAL_TABLET | Freq: Two times a day (BID) | ORAL | Status: DC
  Administered 2015-10-25 – 2015-10-28 (×8): 600 mg via ORAL
  Filled 2015-10-25 (×8): qty 1

## 2015-10-25 MED ORDER — PERFLUTREN LIPID MICROSPHERE
INTRAVENOUS | Status: AC
  Filled 2015-10-25: qty 10

## 2015-10-25 MED ORDER — IPRATROPIUM-ALBUTEROL 0.5-2.5 (3) MG/3ML IN SOLN
3.0000 mL | RESPIRATORY_TRACT | Status: DC | PRN
  Administered 2015-10-26 – 2015-10-27 (×2): 3 mL via RESPIRATORY_TRACT
  Filled 2015-10-25: qty 3

## 2015-10-25 MED ORDER — DEXTROSE 5 % IV SOLN
2.0000 g | Freq: Once | INTRAVENOUS | Status: AC
  Administered 2015-10-25: 2 g via INTRAVENOUS
  Filled 2015-10-25: qty 2

## 2015-10-25 MED ORDER — FUROSEMIDE 80 MG PO TABS: 80.0000 mg | ORAL_TABLET | Freq: Every day | ORAL | Status: DC

## 2015-10-25 MED ORDER — DEXTROSE 5 % IV SOLN
500.0000 mg | INTRAVENOUS | Status: DC
  Administered 2015-10-25 – 2015-10-27 (×3): 500 mg via INTRAVENOUS
  Filled 2015-10-25 (×3): qty 500

## 2015-10-25 MED ORDER — IPRATROPIUM-ALBUTEROL 0.5-2.5 (3) MG/3ML IN SOLN
3.0000 mL | Freq: Four times a day (QID) | RESPIRATORY_TRACT | Status: DC
  Administered 2015-10-25 – 2015-10-26 (×5): 3 mL via RESPIRATORY_TRACT
  Filled 2015-10-25 (×5): qty 3

## 2015-10-25 MED ORDER — METHYLPREDNISOLONE SODIUM SUCC 125 MG IJ SOLR
125.0000 mg | INTRAMUSCULAR | Status: AC
  Administered 2015-10-25: 125 mg via INTRAVENOUS
  Filled 2015-10-25: qty 2

## 2015-10-25 MED ORDER — AMLODIPINE BESYLATE 10 MG PO TABS
10.0000 mg | ORAL_TABLET | Freq: Every day | ORAL | Status: DC
  Administered 2015-10-25 – 2015-10-26 (×2): 10 mg via ORAL
  Filled 2015-10-25 (×2): qty 1

## 2015-10-25 MED ORDER — FUROSEMIDE 10 MG/ML IJ SOLN
40.0000 mg | INTRAMUSCULAR | Status: AC
  Administered 2015-10-25: 40 mg via INTRAVENOUS

## 2015-10-25 MED ORDER — DEXTROSE 5 % IV SOLN
1.0000 g | INTRAVENOUS | Status: DC
  Administered 2015-10-25 – 2015-10-28 (×4): 1 g via INTRAVENOUS
  Filled 2015-10-25 (×5): qty 10

## 2015-10-25 MED ORDER — LOSARTAN POTASSIUM 50 MG PO TABS
100.0000 mg | ORAL_TABLET | Freq: Every day | ORAL | Status: DC
  Administered 2015-10-25 – 2015-10-26 (×2): 100 mg via ORAL
  Filled 2015-10-25 (×2): qty 2

## 2015-10-25 MED ORDER — ATORVASTATIN CALCIUM 20 MG PO TABS
20.0000 mg | ORAL_TABLET | Freq: Every day | ORAL | Status: DC
  Administered 2015-10-25 – 2015-10-27 (×3): 20 mg via ORAL
  Filled 2015-10-25 (×3): qty 1

## 2015-10-25 MED ORDER — ONDANSETRON HCL 4 MG/2ML IJ SOLN: 4.0000 mg | Freq: Four times a day (QID) | INTRAMUSCULAR | Status: DC | PRN

## 2015-10-25 MED ORDER — PERFLUTREN LIPID MICROSPHERE
1.0000 mL | INTRAVENOUS | Status: AC | PRN
  Administered 2015-10-25: 4 mL via INTRAVENOUS
  Filled 2015-10-25: qty 10

## 2015-10-25 MED ORDER — PNEUMOCOCCAL VAC POLYVALENT 25 MCG/0.5ML IJ INJ: 0.5000 mL | INJECTION | INTRAMUSCULAR | Status: DC | PRN

## 2015-10-25 MED ORDER — CITALOPRAM HYDROBROMIDE 20 MG PO TABS
10.0000 mg | ORAL_TABLET | Freq: Every day | ORAL | Status: DC
  Administered 2015-10-25 – 2015-10-28 (×4): 10 mg via ORAL
  Filled 2015-10-25 (×4): qty 1

## 2015-10-25 MED ORDER — ACETAMINOPHEN 325 MG PO TABS
650.0000 mg | ORAL_TABLET | Freq: Four times a day (QID) | ORAL | Status: DC | PRN
  Administered 2015-10-26 – 2015-10-27 (×2): 650 mg via ORAL
  Filled 2015-10-25 (×3): qty 2

## 2015-10-25 MED ORDER — FUROSEMIDE 10 MG/ML IJ SOLN
60.0000 mg | Freq: Two times a day (BID) | INTRAMUSCULAR | Status: DC
  Administered 2015-10-25 – 2015-10-28 (×7): 60 mg via INTRAVENOUS
  Filled 2015-10-25 (×7): qty 6

## 2015-10-25 MED ORDER — ENOXAPARIN SODIUM 40 MG/0.4ML ~~LOC~~ SOLN
40.0000 mg | SUBCUTANEOUS | Status: DC
Start: 2015-10-25 — End: 2015-10-27
  Administered 2015-10-25 – 2015-10-26 (×2): 40 mg via SUBCUTANEOUS
  Filled 2015-10-25 (×2): qty 0.4

## 2015-10-25 MED ORDER — AZITHROMYCIN 250 MG PO TABS
500.0000 mg | ORAL_TABLET | Freq: Once | ORAL | Status: AC
  Administered 2015-10-25: 500 mg via ORAL
  Filled 2015-10-25: qty 2

## 2015-10-25 NOTE — Progress Notes (Signed)
  Echocardiogram 2D Echocardiogram with Definity has been performed.  Brian Silva 10/25/2015, 4:15 PM

## 2015-10-25 NOTE — ED Notes (Signed)
ABT started after cultures obtained

## 2015-10-25 NOTE — Progress Notes (Signed)
MD notified for patient rectal temp 95.2, new order obtained for bair hugger & applied. Will continue to monitor patient closely.

## 2015-10-25 NOTE — ED Notes (Signed)
attempted report 

## 2015-10-25 NOTE — Progress Notes (Signed)
Patient refuses offer of transferring to a bariatric bed for more comfort.

## 2015-10-25 NOTE — ED Notes (Signed)
Fuller Plan, MD at bedside

## 2015-10-25 NOTE — H&P (Signed)
History and Physical    Brian Silva [id number] DOB: October 03, 1940 DOA: 10/24/2015  Referring MD/NP/PA: Dr. Claudine Mouton PCP: Benito Mccreedy, MD  Patient coming from:  Home  Chief Complaint:  Shortness of breath  HPI: Brian Silva is a 75 y.o. male with medical history significant of HTN, chronic respiratory failure on 2 L nasal cannula oxygen, asthma, obesity; who presents with complaints of shortness of breath and cough. He reports having shortness of breath since around 10 AM yesterday morning. He reports trying to utilize his albuterol inhaler, but continuously coughing up phlegm. Associated symptoms include diaphoresis and wheezing. Denies having any recent sick contactsto his  knowledge, chest pain, palpitations, or weight gain. He actually notes that he lost 9 pounds at his last doctor's appointment and he believes his weight at that time was around 401 pounds. The symptoms somewhat felt like  when he has had an asthma exacerbation in the past. Patient notes that he has been short of breath and felt that he needed to be on oxygen at home. He reports being out of some of his medications and requests refills. He states that he was not able take his home Lasix of 80 mg prior to coming in the hospital.  ED Course: Upon admission into the emergency department patient was seen to be febrile up to 102.40F, heart rates up to  111, respirations up to 22, blood pressures maintain, and hypoxic on room air as low as 86%. O2 saturations improved after nasal cannula oxygen. Lab work revealed WBC 8.5, hemoglobin 12, sodium 129, chloride 98, CO2 21, BUN 15, and creatinine 1.33. Patient was started on azithromycin and ceftriaxone for suspected may require pneumonia. TR[facility] called to  admit.  Review of Systems: As per HPI otherwise 10 point review of systems negative.   Past Medical History  Diagnosis Date  . Hypertension   . Asthma   . Obese     History reviewed. No pertinent past surgical history.   reports that he has never smoked. He does not have any smokeless tobacco history on file. He reports that he drinks alcohol. His drug history is not on file.  No Known Allergies  Family history reviewed. Patient denies any pertinent family history.  Prior to Admission medications   Medication Sig Start Date End Date Taking? Authorizing Provider  albuterol (PROVENTIL HFA;VENTOLIN HFA) 108 (90 BASE) MCG/ACT inhaler Inhale 2 puffs into the lungs every 6 (six) hours as needed. For shortness of breath   Yes Historical Provider, MD  albuterol (PROVENTIL) (2.5 MG/3ML) 0.083% nebulizer solution Take 2.5 mg by nebulization every 4 (four) hours as needed for wheezing or shortness of breath.   Yes Historical Provider, MD  amLODipine (NORVASC) 10 MG tablet Take 10 mg by mouth daily.     Yes Historical Provider, MD  atorvastatin (LIPITOR) 20 MG tablet Take 20 mg by mouth at bedtime.     Yes Historical Provider, MD  citalopram (CELEXA) 10 MG tablet Take 10 mg by mouth daily.     Yes Historical Provider, MD  cloNIDine (CATAPRES) 0.3 MG tablet Take 0.3 mg by mouth daily.    Yes Historical Provider, MD  furosemide (LASIX) 80 MG tablet Take 80 mg by mouth daily.    Yes Historical Provider, MD  losartan (COZAAR) 100 MG tablet Take 100 mg by mouth daily. 10/10/15  Yes Historical Provider, MD  HYDROcodone-acetaminophen (NORCO/VICODIN) 5-325 MG per tablet Take 1-2 tablets by mouth every 6 (six) hours as needed. Patient not taking: Reported  on 10/25/2015 07/09/13   Antonietta Breach, PA-C    Physical Exam:    Constitutional: Obese male who appears acutely ill, but nontoxic. Filed Vitals:   10/24/15 2305 10/24/15 2308 10/24/15 2315 10/24/15 2336  BP: 149/52     Pulse: 111     Temp: 99.7 F (37.6 C)   102.7 F (39.3 C)  TempSrc: Oral   Rectal  Resp: 20     Height: 5\' 10"  (1.778 m)  5\' 10"  (1.778 m)   Weight: 181.892 kg (401 lb)  181.892 kg (401 lb)   SpO2: 97% 93%     Eyes: PERRL, lids and conjunctivae  normal ENMT: Mucous membranes are moist. Posterior pharynx clear of any exudate or lesions.Normal dentition.  Neck: normal, supple, no masses, no thyromegaly Respiratory: Tachypneic on nasal cannula oxygen 2 L O2 sats 96%.  Cardiovascular: Tachycardic, no murmurs / rubs / gallops. No extremity edema. 2+ pedal pulses. No carotid bruits.  Abdomen: no tenderness, no masses palpated. No hepatosplenomegaly. Bowel sounds positive.  Musculoskeletal: no clubbing / cyanosis. No joint deformity upper and lower extremities. Good ROM, no contractures. Normal muscle tone.  Skin: Patient profusely diaphoretic. no rashes, lesions, ulcers. No induration  Neurologic: CN 2-12 grossly intact. Sensation intact, DTR normal. Strength 5/5 in all 4.  Psychiatric: Normal judgment and insight. Alert and oriented x 3. Normal mood.     Labs on Admission: I have personally reviewed following labs and imaging studies  CBC:  Recent Labs Lab 10/24/15 2329  WBC 8.5  NEUTROABS 7.0  HGB 12.0*  HCT 36.5*  MCV 83.5  PLT 123456   Basic Metabolic Panel:  Recent Labs Lab 10/24/15 2329  NA 129*  K 3.7  CL 98*  CO2 21*  GLUCOSE 174*  BUN 15  CREATININE 1.33*  CALCIUM 8.1*   GFR: Estimated Creatinine Clearance: 82.8 mL/min (by C-G formula based on Cr of 1.33). Liver Function Tests: No results for input(s): AST, ALT, ALKPHOS, BILITOT, PROT, ALBUMIN in the last 168 hours. No results for input(s): LIPASE, AMYLASE in the last 168 hours. No results for input(s): AMMONIA in the last 168 hours. Coagulation Profile: No results for input(s): INR, PROTIME in the last 168 hours. Cardiac Enzymes: No results for input(s): CKTOTAL, CKMB, CKMBINDEX, TROPONINI in the last 168 hours. BNP (last 3 results) No results for input(s): PROBNP in the last 8760 hours. HbA1C: No results for input(s): HGBA1C in the last 72 hours. CBG: No results for input(s): GLUCAP in the last 168 hours. Lipid Profile: No results for input(s):  CHOL, HDL, LDLCALC, TRIG, CHOLHDL, LDLDIRECT in the last 72 hours. Thyroid Function Tests: No results for input(s): TSH, T4TOTAL, FREET4, T3FREE, THYROIDAB in the last 72 hours. Anemia Panel: No results for input(s): VITAMINB12, FOLATE, FERRITIN, TIBC, IRON, RETICCTPCT in the last 72 hours. Urine analysis:    Component Value Date/Time   COLORURINE YELLOW 03/30/2012 2227   APPEARANCEUR CLEAR 03/30/2012 2227   LABSPEC 1.014 03/30/2012 2227   PHURINE 6.5 03/30/2012 2227   GLUCOSEU NEGATIVE 03/30/2012 2227   HGBUR NEGATIVE 03/30/2012 2227   HGBUR negative 01/04/2010 1409   BILIRUBINUR NEGATIVE 03/30/2012 2227   KETONESUR NEGATIVE 03/30/2012 2227   PROTEINUR NEGATIVE 03/30/2012 2227   UROBILINOGEN 0.2 03/30/2012 2227   NITRITE NEGATIVE 03/30/2012 2227   LEUKOCYTESUR NEGATIVE 03/30/2012 2227   Sepsis Labs: No results found for this or any previous visit (from the past 240 hour(s)).   Radiological Exams on Admission: Dg Chest 2 View  10/25/2015  CLINICAL  DATA:  Shortness breath, congestion today. History asthma, hypertension and obesity. EXAM: CHEST  2 VIEW COMPARISON:  Chest radiograph July 09, 2013 FINDINGS: Habitus limited examination. Cardiac silhouette appears mild to moderately enlarged. Mediastinal silhouette is nonsuspicious, calcified aortic knob. Mild pulmonary vascular congestion without pleural effusion or focal consolidation the posterior costophrenic angle is not included in field of view. No pneumothorax. Osseous structures are nonsuspicious. IMPRESSION: Cardiomegaly and pulmonary vascular congestion. Electronically Signed   By: Elon Alas M.D.   On: 10/25/2015 00:11    EKG: Independently reviewed. Sinus tachycardia  Assessment/Plan Sepsis secondary to community-acquired pneumonia: Patient presented with fever, tachypnea, and chest x-ray showing signs of pulmonary congestion versus infection. BNP to be 66.7 and patient reports recent weight loss and not weight gain. -  Admit to stepdown - Follow-up blood and sputum culture - Empiric Ceftriaxone and azithromycin   - Tylenol prn fever  - Mucinex - Unclear patient could possibly be fluid overloaded may warrant echocardiogram in a.m.  Acute respiratory failure with hypoxia/ acute asthma exacerbation: Acute on chronic - Continuous pulse oximetry with nasal cannula oxygen to keep O2 sats greater than 92% - Solu-Medrol 125 mg 1 dose stat  - DuoNeb's  q 2hour prn SOB or wheezing   Question acute kidney injury: Patient was noted to have a creatinine of 0.91 back in 06/2013. On presentation his creatinine is elevated at 1.33 with a BUN of 15. - Repeat BMP in a.m.  Essential hypertension  - continue clonidine, Amlodipine, losartan and Lasix  Depression - Continue Celexa  Hyperlipidemia - Continue atorvastatin   DVT prophylaxis: lovenox Code Status: Full  Family Communication: No family present at bedside Disposition Plan: Undetermined Consults called: None Admission status: Inpatient stepdown  Norval Morton MD [facility] Pager [phone]  If 7PM-7AM, please contact night-coverage [email or web address] Password [email or web address]  10/25/2015, 1:05 AM

## 2015-10-25 NOTE — ED Notes (Signed)
Admitting at bedside 

## 2015-10-25 NOTE — Progress Notes (Signed)
PROGRESS NOTE    Brian Silva  [id number] DOB: 1941-04-14 DOA: 10/24/2015 PCP: Benito Mccreedy, MD  Outpatient Specialists:   Brief Narrative: As per Dr. Norval Morton: 75 y.o. male with medical history significant of HTN, chronic respiratory failure on 2 L nasal cannula oxygen, asthma, obesity; who presents with complaints of shortness of breath and cough. He reports having shortness of breath since around 10 AM yesterday morning. He reports trying to utilize his albuterol inhaler, but continuously coughing up phlegm. Associated symptoms include diaphoresis and wheezing. Denies having any recent sick contactsto his knowledge, chest pain, palpitations, or weight gain. He actually notes that he lost 9 pounds at his last doctor's appointment and he believes his weight at that time was around 401 pounds. The symptoms somewhat felt like when he has had an asthma exacerbation in the past. Patient notes that he has been short of breath and felt that he needed to be on oxygen at home. He reports being out of some of his medications and requests refills. He states that he was not able take his home Lasix of 80 mg prior to coming in the hospital.    Assessment & Plan:   Principal Problem:   CAP (community acquired pneumonia) Active Problems:   Essential hypertension   Asthma   Depression   Hyperlipidemia  Assessment/Plan Sepsis secondary to community-acquired pneumonia: Patient presented with fever, tachypnea, and chest x-ray showing signs of pulmonary congestion versus infection. Hypothermic earlier today - For warming blanket - Follow-up blood and sputum culture - Empiric Ceftriaxone and azithromycin  - Tylenol prn fever  - Mucinex   Acute respiratory failure with hypoxia/ acute asthma exacerbation: Acute on chronic - Continuous pulse oximetry with nasal cannula oxygen to keep O2 sats greater than 92% - Solu-Medrol 125 mg 1 dose stat  - DuoNeb's q 2hour prn SOB or wheezing,  and Q6H PRN. Lungs are clear. Low threshold to add steroids (either Tablets, IV or nebulizer)  Acute kidney injury: Patient was noted to have a creatinine of 0.91 back in 06/2013. On presentation his creatinine is elevated at 1.33. Monitor closely. For further work up if no improvement.  Essential hypertension  - continue clonidine, Amlodipine, losartan  Possible pulmonary edema (as per CXR) - Change Lasix to IV. Monitor electrolytes and renal function. Repeat CXR in am.  Depression - Continue Celexa  Hyperlipidemia - Continue atorvastatin   DVT prophylaxis: lovenox Code Status: Full  Family Communication: No family present at bedside Disposition Plan: Undetermined Consults called: None Admission status: Inpatient stepdown  Subjective: No new complaints. Fever, cough and some SOB prior to admission.  Objective: Filed Vitals:   10/25/15 0500 10/25/15 0600 10/25/15 0700 10/25/15 0754  BP: 133/59 127/55 124/52 135/57  Pulse: 71 65 65 80  Temp:    95.2 F (35.1 C)  TempSrc:    Rectal  Resp: 18   25  Height:      Weight:      SpO2: 96% 97% 97% 100%    Intake/Output Summary (Last 24 hours) at 10/25/15 0838 Last data filed at 10/25/15 0809  Gross per 24 hour  Intake      0 ml  Output   1250 ml  Net  -1250 ml   Filed Weights   10/24/15 2305 10/24/15 2315 10/25/15 0209  Weight: 181.892 kg (401 lb) 181.892 kg (401 lb) 181 kg (399 lb 0.5 oz)    Examination:  General exam: Morbidly obese. Appears calm and comfortable  Respiratory system:  Decreased air entry. Cardiovascular system: S1 & S2. Gastrointestinal system: Abdomen is Morbidly obese, not tender. Organs are difficult to assess.  Central nervous system: Alert and oriented. Moves all limbs. Extremities: Fullness of the ankles.   Data Reviewed: I have personally reviewed following labs and imaging studies  CBC:  Recent Labs Lab 10/24/15 2329 10/25/15 0422  WBC 8.5 8.3  NEUTROABS 7.0 7.8*  HGB 12.0* 12.7*    HCT 36.5* 38.8*  MCV 83.5 83.6  PLT 260 0000000   Basic Metabolic Panel:  Recent Labs Lab 10/24/15 2329 10/25/15 0422  NA 129* 129*  K 3.7 3.6  CL 98* 97*  CO2 21* 21*  GLUCOSE 174* 305*  BUN 15 17  CREATININE 1.33* 1.31*  CALCIUM 8.1* 8.2*   GFR: Estimated Creatinine Clearance: 83.8 mL/min (by C-G formula based on Cr of 1.31). Liver Function Tests: No results for input(s): AST, ALT, ALKPHOS, BILITOT, PROT, ALBUMIN in the last 168 hours. No results for input(s): LIPASE, AMYLASE in the last 168 hours. No results for input(s): AMMONIA in the last 168 hours. Coagulation Profile: No results for input(s): INR, PROTIME in the last 168 hours. Cardiac Enzymes: No results for input(s): CKTOTAL, CKMB, CKMBINDEX, TROPONINI in the last 168 hours. BNP (last 3 results) No results for input(s): PROBNP in the last 8760 hours. HbA1C: No results for input(s): HGBA1C in the last 72 hours. CBG: No results for input(s): GLUCAP in the last 168 hours. Lipid Profile: No results for input(s): CHOL, HDL, LDLCALC, TRIG, CHOLHDL, LDLDIRECT in the last 72 hours. Thyroid Function Tests: No results for input(s): TSH, T4TOTAL, FREET4, T3FREE, THYROIDAB in the last 72 hours. Anemia Panel: No results for input(s): VITAMINB12, FOLATE, FERRITIN, TIBC, IRON, RETICCTPCT in the last 72 hours. Urine analysis:    Component Value Date/Time   COLORURINE YELLOW 10/25/2015 0058   APPEARANCEUR CLEAR 10/25/2015 0058   LABSPEC 1.010 10/25/2015 0058   PHURINE 5.5 10/25/2015 0058   GLUCOSEU NEGATIVE 10/25/2015 0058   HGBUR SMALL* 10/25/2015 0058   HGBUR negative 01/04/2010 1409   BILIRUBINUR NEGATIVE 10/25/2015 0058   KETONESUR NEGATIVE 10/25/2015 0058   PROTEINUR NEGATIVE 10/25/2015 0058   UROBILINOGEN 0.2 03/30/2012 2227   NITRITE NEGATIVE 10/25/2015 0058   LEUKOCYTESUR NEGATIVE 10/25/2015 0058   Sepsis Labs: @LABRCNTIP (procalcitonin:4,lacticidven:4)  ) Recent Results (from the past 240 hour(s))  MRSA  PCR Screening     Status: None   Collection Time: 10/25/15  2:15 AM  Result Value Ref Range Status   MRSA by PCR NEGATIVE NEGATIVE Final    Comment:        The GeneXpert MRSA Assay (FDA approved for NASAL specimens only), is one component of a comprehensive MRSA colonization surveillance program. It is not intended to diagnose MRSA infection nor to guide or monitor treatment for MRSA infections.          Radiology Studies: Dg Chest 2 View  10/25/2015  CLINICAL DATA:  Shortness breath, congestion today. History asthma, hypertension and obesity. EXAM: CHEST  2 VIEW COMPARISON:  Chest radiograph July 09, 2013 FINDINGS: Habitus limited examination. Cardiac silhouette appears mild to moderately enlarged. Mediastinal silhouette is nonsuspicious, calcified aortic knob. Mild pulmonary vascular congestion without pleural effusion or focal consolidation the posterior costophrenic angle is not included in field of view. No pneumothorax. Osseous structures are nonsuspicious. IMPRESSION: Cardiomegaly and pulmonary vascular congestion. Electronically Signed   By: Elon Alas M.D.   On: 10/25/2015 00:11        Scheduled Meds: . amLODipine  10 mg Oral Daily  . atorvastatin  20 mg Oral QHS  . [START ON 10/26/2015] azithromycin  500 mg Intravenous Q24H  . cefTRIAXone (ROCEPHIN)  IV  1 g Intravenous Q24H  . citalopram  10 mg Oral Daily  . cloNIDine  0.3 mg Oral Daily  . furosemide  60 mg Intravenous BID  . guaiFENesin  600 mg Oral BID  . ipratropium-albuterol  3 mL Nebulization Q6H  . losartan  100 mg Oral Daily   Continuous Infusions:    LOS: 0 days    Time spent: 30 Minutes.    Dana Allan, MD  [facility] Pager #: [phone] 7PM-7AM contact night coverage as above

## 2015-10-26 ENCOUNTER — Encounter (HOSPITAL_COMMUNITY): Payer: Self-pay | Admitting: Physician Assistant

## 2015-10-26 ENCOUNTER — Inpatient Hospital Stay (HOSPITAL_COMMUNITY): Payer: Medicare Other

## 2015-10-26 DIAGNOSIS — I5033 Acute on chronic diastolic (congestive) heart failure: Secondary | ICD-10-CM

## 2015-10-26 DIAGNOSIS — J810 Acute pulmonary edema: Secondary | ICD-10-CM

## 2015-10-26 DIAGNOSIS — A419 Sepsis, unspecified organism: Principal | ICD-10-CM

## 2015-10-26 DIAGNOSIS — I10 Essential (primary) hypertension: Secondary | ICD-10-CM

## 2015-10-26 DIAGNOSIS — J189 Pneumonia, unspecified organism: Secondary | ICD-10-CM

## 2015-10-26 DIAGNOSIS — I5032 Chronic diastolic (congestive) heart failure: Secondary | ICD-10-CM

## 2015-10-26 LAB — URINE CULTURE

## 2015-10-26 LAB — RENAL FUNCTION PANEL
Albumin: 2.6 g/dL — ABNORMAL LOW (ref 3.5–5.0)
Albumin: 2.7 g/dL — ABNORMAL LOW (ref 3.5–5.0)
Anion gap: 12 (ref 5–15)
Anion gap: 13 (ref 5–15)
BUN: 29 mg/dL — ABNORMAL HIGH (ref 6–20)
BUN: 30 mg/dL — ABNORMAL HIGH (ref 6–20)
CO2: 19 mmol/L — ABNORMAL LOW (ref 22–32)
CO2: 20 mmol/L — ABNORMAL LOW (ref 22–32)
Calcium: 8.3 mg/dL — ABNORMAL LOW (ref 8.9–10.3)
Calcium: 8.4 mg/dL — ABNORMAL LOW (ref 8.9–10.3)
Chloride: 94 mmol/L — ABNORMAL LOW (ref 101–111)
Chloride: 95 mmol/L — ABNORMAL LOW (ref 101–111)
Creatinine, Ser: 1.38 mg/dL — ABNORMAL HIGH (ref 0.61–1.24)
Creatinine, Ser: 1.4 mg/dL — ABNORMAL HIGH (ref 0.61–1.24)
GFR calc Af Amer: 56 mL/min — ABNORMAL LOW (ref >60)
GFR calc Af Amer: 57 mL/min — ABNORMAL LOW (ref >60)
GFR calc non Af Amer: 49 mL/min — ABNORMAL LOW (ref >60)
GFR calc non Af Amer: 50 mL/min — ABNORMAL LOW (ref >60)
Glucose, Bld: 286 mg/dL — ABNORMAL HIGH (ref 65–99)
Glucose, Bld: 287 mg/dL — ABNORMAL HIGH (ref 65–99)
Phosphorus: 3.7 mg/dL (ref 2.5–4.6)
Phosphorus: 3.7 mg/dL (ref 2.5–4.6)
Potassium: 3.7 mmol/L (ref 3.5–5.1)
Potassium: 3.9 mmol/L (ref 3.5–5.1)
Sodium: 126 mmol/L — ABNORMAL LOW (ref 135–145)
Sodium: 127 mmol/L — ABNORMAL LOW (ref 135–145)

## 2015-10-26 LAB — GLUCOSE, CAPILLARY
Glucose-Capillary: 245 mg/dL — ABNORMAL HIGH (ref 65–99)
Glucose-Capillary: 285 mg/dL — ABNORMAL HIGH (ref 65–99)
Glucose-Capillary: 312 mg/dL — ABNORMAL HIGH (ref 65–99)

## 2015-10-26 MED ORDER — GUAIFENESIN-DM 100-10 MG/5ML PO SYRP
5.0000 mL | ORAL_SOLUTION | ORAL | Status: DC | PRN
  Administered 2015-10-27 – 2015-10-28 (×4): 5 mL via ORAL
  Filled 2015-10-26 (×4): qty 5

## 2015-10-26 MED ORDER — METHYLPREDNISOLONE SODIUM SUCC 40 MG IJ SOLR
40.0000 mg | Freq: Every day | INTRAMUSCULAR | Status: DC
  Administered 2015-10-26 – 2015-10-28 (×3): 40 mg via INTRAVENOUS
  Filled 2015-10-26 (×3): qty 1

## 2015-10-26 MED ORDER — INSULIN ASPART 100 UNIT/ML ~~LOC~~ SOLN
0.0000 [IU] | Freq: Three times a day (TID) | SUBCUTANEOUS | Status: DC
Start: 2015-10-26 — End: 2015-10-28
  Administered 2015-10-26: 5 [IU] via SUBCUTANEOUS
  Administered 2015-10-26: 3 [IU] via SUBCUTANEOUS
  Administered 2015-10-27: 7 [IU] via SUBCUTANEOUS
  Administered 2015-10-27 – 2015-10-28 (×3): 2 [IU] via SUBCUTANEOUS
  Administered 2015-10-28: 3 [IU] via SUBCUTANEOUS

## 2015-10-26 MED ORDER — LORAZEPAM 0.5 MG PO TABS
0.5000 mg | ORAL_TABLET | Freq: Once | ORAL | Status: AC
  Administered 2015-10-26: 0.5 mg via ORAL
  Filled 2015-10-26: qty 1

## 2015-10-26 MED ORDER — IPRATROPIUM-ALBUTEROL 0.5-2.5 (3) MG/3ML IN SOLN
3.0000 mL | Freq: Three times a day (TID) | RESPIRATORY_TRACT | Status: DC
  Administered 2015-10-26 – 2015-10-28 (×7): 3 mL via RESPIRATORY_TRACT
  Filled 2015-10-26 (×8): qty 3

## 2015-10-26 MED ORDER — CLONIDINE HCL 0.1 MG PO TABS
0.1000 mg | ORAL_TABLET | Freq: Every day | ORAL | Status: DC
  Administered 2015-10-27 – 2015-10-28 (×2): 0.1 mg via ORAL
  Filled 2015-10-26 (×2): qty 1

## 2015-10-26 NOTE — Progress Notes (Signed)
Inpatient Diabetes Program Recommendations  AACE/ADA: New Consensus Statement on Inpatient Glycemic Control (2015)  Target Ranges:  Prepandial:   less than 140 mg/dL      Peak postprandial:   less than 180 mg/dL (1-2 hours)      Critically ill patients:  140 - 180 mg/dL   Lab Results  Component Value Date   GLUCAP 245* 10/26/2015   HGBA1C 6.3* 01/04/2010   Results for EUGUNE, LOCKNER (MRN [id number]) as of 10/26/2015 12:37  Ref. Range 10/26/2015 12:18  Glucose-Capillary Latest Ref Range: 65-99 mg/dL 245 (H)   Review of Glycemic Control  Diabetes history: none Outpatient Diabetes medications: none Current orders for Inpatient glycemic control: none HgbA1C pending.  Inpatient Diabetes Program Recommendations:    Change diet to CHO mod med heart healthy Add Novolog moderate tidwc If FBS > 180 mg/dL, add Levemir.  Will continue to follow. Await HgbA1C results.  Thank you. Lorenda Peck, RD, LDN, CDE Inpatient Diabetes Coordinator [phone]

## 2015-10-26 NOTE — Progress Notes (Signed)
Patient ID: Brian Silva, male   DOB: 1940-05-02, 75 y.o.   MRN: [id number]    PROGRESS NOTE    Brian Silva  [id number] DOB: 1940-07-07 DOA: 10/24/2015  PCP: Benito Mccreedy, MD   Brief Narrative:  75 y.o. male with HTN, chronic respiratory failure on 2 L nasal cannula oxygen, asthma, obesity; who presented with main concern of several days duration of progressively worsening dyspnea that presented initially with exertion and has progressed to dyspnea at rest, associated with diaphoresis, subjective fevers and chills, productive cough of yellow sputum.   Assessment & Plan:   Acute on chronic respiratory failure with hypoxia, oxygen dependent at baseline 2 L [place] - suspect this is multifactorial and secondary to acute on chronic diastolic CHF, ? CAP, unknown pathogen, ? Asthma exacerbation  - pt still with significant dyspnea, RR in high 20's - currently on Zithromax and Rocephin for CAP and Lasix 40 mg IV BID for CHF - I will continue same regimen for now and will proceed with CT chest for clearer evaluation  Sepsis secondary to community-acquired pneumonia, unknown pathogen  - Patient presented with hyper and hypothermia, tachycardia with HR > 90 and RR in high 20's and low [id number] - theres is certainly dullness to percussion in bilateral lower lobes but unclear if related to vascular congestion vs infiltrate  - Follow-up blood and sputum culture - continue Ceftriaxone and azithromycin day #3  Acute on chronic diastolic CHF - EF [id number] with grade II diastolic CHF per ECHO done on this admission - weight 396 lbs this AM, cardiology consulted - currently on Lasix 40 mg IV BID but Na trending down and Cr up so will need to monitor closely  - continue to monitor daily weights, strict I/O  Acute asthma exacerbation, Acute on chronic - solumedrol 40 mg IV QD started as pt with significant wheezing  - continue BD's scheduled and as needed   Acute kidney injury - appears to be pre  renal in etiology - Cr up this am likely from Lasix - discuss Lasix dosing with cardiology - hold losartan for now  - BMP In AM  Essential hypertension  - SBP in 90's - stop Norvasc and Losartan until BP stabilizes  - cut dose of Clonidine down from 0.3 to 0.1 mg PO QD  Depression - Continue Celexa  Hyperlipidemia - Continue atorvastatin   Hyperglycemia  - steroid induced - check A1C   Morbid obesity due to excess calories  - Body mass index is 56.88 kg/(m^2).  DVT prophylaxis: Lovenox SQ Code Status: Full  Family Communication: Patient at bedside  Disposition Plan: To be determined   Consultants:   None  Procedures:   None  Antimicrobials:   Zithromax 7/3 -->  Rocephin 7/3 -->  Subjective: Pt still with significant dyspnea, productive cough.   Objective: Filed Vitals:   10/26/15 0700 10/26/15 0747 10/26/15 0751 10/26/15 0800  BP: 113/59   93/56  Pulse: 66   77  Temp:    97.5 F (36.4 C)  TempSrc:    Oral  Resp: 16   18  Height:      Weight:      SpO2: 96% 98% 98% 95%    Intake/Output Summary (Last 24 hours) at 10/26/15 1027 Last data filed at 10/26/15 0957  Gross per 24 hour  Intake   1642 ml  Output   2900 ml  Net  -1258 ml   Filed Weights   10/24/15 2315 10/25/15 0209 10/26/15 0500  Weight: 181.892 kg (401 lb) 181 kg (399 lb 0.5 oz) 179.8 kg (396 lb 6.2 oz)    Examination:  General exam: Appears calm and comfortable  Respiratory system: Course breath sounds bilaterally with dullness to percussion, exp wheezing noted, rhonchi in both lobes as well  Cardiovascular system: S1 & S2 heard, RRR. No JVD, rubs, gallops or clicks.  Gastrointestinal system: Abdomen is nondistended, soft and nontender. No organomegaly or masses felt.  Central nervous system: Alert and oriented. No focal neurological deficits. Extremities: Symmetric 5 x 5 power.  Data Reviewed: I have personally reviewed following labs and imaging studies  CBC:  Recent  Labs Lab 10/24/15 2329 10/25/15 0422  WBC 8.5 8.3  NEUTROABS 7.0 7.8*  HGB 12.0* 12.7*  HCT 36.5* 38.8*  MCV 83.5 83.6  PLT 260 0000000   Basic Metabolic Panel:  Recent Labs Lab 10/24/15 2329 10/25/15 0422 10/26/15 0326 10/26/15 0328  NA 129* 129* 126* 127*  K 3.7 3.6 3.9 3.7  CL 98* 97* 95* 94*  CO2 21* 21* 19* 20*  GLUCOSE 174* 305* 286* 287*  BUN 15 17 29* 30*  CREATININE 1.33* 1.31* 1.38* 1.40*  CALCIUM 8.1* 8.2* 8.3* 8.4*  PHOS  --   --  3.7 3.7   Liver Function Tests:  Recent Labs Lab 10/26/15 0326 10/26/15 0328  ALBUMIN 2.6* 2.7*   Urine analysis:    Component Value Date/Time   COLORURINE YELLOW 10/25/2015 0058   APPEARANCEUR CLEAR 10/25/2015 0058   LABSPEC 1.010 10/25/2015 0058   PHURINE 5.5 10/25/2015 0058   GLUCOSEU NEGATIVE 10/25/2015 0058   HGBUR SMALL* 10/25/2015 0058   HGBUR negative 01/04/2010 1409   BILIRUBINUR NEGATIVE 10/25/2015 0058   KETONESUR NEGATIVE 10/25/2015 0058   PROTEINUR NEGATIVE 10/25/2015 0058   UROBILINOGEN 0.2 03/30/2012 2227   NITRITE NEGATIVE 10/25/2015 0058   LEUKOCYTESUR NEGATIVE 10/25/2015 0058   Recent Results (from the past 240 hour(s))  Urine culture     Status: Abnormal   Collection Time: 10/25/15 12:58 AM  Result Value Ref Range Status   Specimen Description URINE, RANDOM  Final   Special Requests NONE  Final   Culture MULTIPLE SPECIES PRESENT, SUGGEST RECOLLECTION (A)  Final   Report Status 10/26/2015 FINAL  Final  MRSA PCR Screening     Status: None   Collection Time: 10/25/15  2:15 AM  Result Value Ref Range Status   MRSA by PCR NEGATIVE NEGATIVE Final    Comment:        The GeneXpert MRSA Assay (FDA approved for NASAL specimens only), is one component of a comprehensive MRSA colonization surveillance program. It is not intended to diagnose MRSA infection nor to guide or monitor treatment for MRSA infections.       Radiology Studies: Dg Chest 2 View 10/25/2015 Cardiomegaly and pulmonary vascular  congestion.   Dg Chest Port 1 View 10/26/2015 Evidence of pulmonary vascular congestion without edema or consolidation. There is mild right base atelectasis. There is aortic atherosclerosis.   Scheduled Meds: . amLODipine  10 mg Oral Daily  . atorvastatin  20 mg Oral QHS  . azithromycin  500 mg Intravenous Q24H  . cefTRIAXone (ROCEPHIN)  IV  1 g Intravenous Q24H  . citalopram  10 mg Oral Daily  . cloNIDine  0.3 mg Oral Daily  . enoxaparin (LOVENOX) injection  40 mg Subcutaneous Q24H  . furosemide  60 mg Intravenous BID  . guaiFENesin  600 mg Oral BID  . ipratropium-albuterol  3 mL Nebulization TID  .  losartan  100 mg Oral Daily   Continuous Infusions:    LOS: 1 day   Time spent: 20 minutes   Faye Ramsay, MD [facility] Pager [phone]  If 7PM-7AM, please contact night-coverage [email or web address] Password [email or web address] 10/26/2015, 10:27 AM

## 2015-10-26 NOTE — Consult Note (Signed)
CARDIOLOGY CONSULT NOTE   Patient ID: Brian Silva MRN: [id number] DOB/AGE: 1940-11-09 75 y.o.  Admit date: 10/24/2015  Primary Physician   Benito Mccreedy, MD Primary Cardiologist  New Reason for Consultation   CHF Requesting Physician  Dr. Doyle Askew HPI: Brian Silva is a 75 y.o. male with a history of Hypertension, asthma, obesity, chronic respiratory failure on 2 L nasal cannula, obstructive sleep apnea not on CPAP and hyperlipidemia who presented for evaluation of worsening cough and shortness of breath.  Sleep study 08/2010 showed mild to moderate obstructive sleep apnea/hypopnea syndrome. However, never initiated CPAP. Never seen by any cardiologist. Patient takes Lasix 80 mg once a day for many years. However, he has not taken these for the past few weeks as he has self discontinued. He is trying to lose his weight and lost about 50 pounds in the last one year. Coronary artery disease on CT angiography chest 05/2008.  He has a progressive worsening cough for the past few weeks. Reports of white phlegm. Associated with wheezing. No sick contact. The patient developed a sudden onset shortness of breath 10/24/15 leading to ER presentation. His symptoms were like prior asthma exacerbation. Patient denies any chest pain, palpitations, melena, blood in his stool or urine. Admits to having orthopnea and PND. No syncope.  Workup revealed sepsis secondary to community-acquired pneumonia and started on antibiotic, nebulizer treatment and Solu-Medrol. Creatinine elevated 1.38-1.4. Elevated blood sugar. Chest x-ray this morning shows vascular congestion without edema or consolidation. BNP of 66.7 on admission. EKG shows sinus rhythm and rate of 102 bpm, no acute changes. Echo during this admission shows normal left ventricular function of 0000000, grade 2 diastolic dysfunction, mildly dilated left atrium. Cardiology is consulted for further management.   Past Medical History  Diagnosis Date  .  Hypertension   . Asthma   . Obese   . Hyperlipidemia   . OSA (obstructive sleep apnea)     Sleep study 08/2010 showed mild to moderate obstructive sleep apnea/hypopnea syndrome. However, never initiated CPAP  . Chronic respiratory failure (HCC)     on 2L oxygen  . Venous stasis     Venous Statis Disease (03/28/2004)     No Known Allergies  I have reviewed the patient's current medications . atorvastatin  20 mg Oral QHS  . azithromycin  500 mg Intravenous Q24H  . cefTRIAXone (ROCEPHIN)  IV  1 g Intravenous Q24H  . citalopram  10 mg Oral Daily  . [START ON 10/27/2015] cloNIDine  0.1 mg Oral Daily  . enoxaparin (LOVENOX) injection  40 mg Subcutaneous Q24H  . furosemide  60 mg Intravenous BID  . guaiFENesin  600 mg Oral BID  . insulin aspart  0-9 Units Subcutaneous TID WC  . ipratropium-albuterol  3 mL Nebulization TID  . methylPREDNISolone (SOLU-MEDROL) injection  40 mg Intravenous Daily     acetaminophen, guaiFENesin-dextromethorphan, ipratropium-albuterol, ondansetron (ZOFRAN) IV, pneumococcal 23 valent vaccine  Prior to Admission medications   Medication Sig Start Date End Date Taking? Authorizing Provider  albuterol (PROVENTIL HFA;VENTOLIN HFA) 108 (90 BASE) MCG/ACT inhaler Inhale 2 puffs into the lungs every 6 (six) hours as needed. For shortness of breath   Yes Historical Provider, MD  albuterol (PROVENTIL) (2.5 MG/3ML) 0.083% nebulizer solution Take 2.5 mg by nebulization every 4 (four) hours as needed for wheezing or shortness of breath.   Yes Historical Provider, MD  amLODipine (NORVASC) 10 MG tablet Take 10 mg by mouth daily.     Yes Historical Provider, MD  atorvastatin (LIPITOR) 20 MG tablet Take 20 mg by mouth at bedtime.     Yes Historical Provider, MD  citalopram (CELEXA) 10 MG tablet Take 10 mg by mouth daily.     Yes Historical Provider, MD  cloNIDine (CATAPRES) 0.3 MG tablet Take 0.3 mg by mouth daily.    Yes Historical Provider, MD  furosemide (LASIX) 80 MG tablet  Take 80 mg by mouth daily.    Yes Historical Provider, MD  losartan (COZAAR) 100 MG tablet Take 100 mg by mouth daily. 10/10/15  Yes Historical Provider, MD  HYDROcodone-acetaminophen (NORCO/VICODIN) 5-325 MG per tablet Take 1-2 tablets by mouth every 6 (six) hours as needed. Patient not taking: Reported on 10/25/2015 07/09/13   Antonietta Breach, PA-C     Social History   Social History  . Marital Status: Single    Spouse Name: N/A  . Number of Children: N/A  . Years of Education: N/A   Occupational History  . Not on file.   Social History Main Topics  . Smoking status: Never Smoker   . Smokeless tobacco: Not on file  . Alcohol Use: Yes     Comment: occasionally   . Drug Use: Not on file  . Sexual Activity: Not on file   Other Topics Concern  . Not on file   Social History Narrative    No family status information on file.   No family history of heart disease.  ROS:  Full 14 point review of systems complete and found to be negative unless listed above.  Physical Exam: Blood pressure 91/52, pulse 69, temperature 97.6 F (36.4 C), temperature source Oral, resp. rate 21, height 5\' 10"  (1.778 m), weight 396 lb 6.2 oz (179.8 kg), SpO2 97 %.  General: Well developed, well nourished, obese male in no acute distress on Oxygen Head: Eyes PERRLA, No xanthomas. Normocephalic and atraumatic, oropharynx without edema or exudate.  Lungs: Resp regular and unlabored, Diminished breath sound throughout  Heart: RRR no s3, s4, or murmurs..   Neck: No carotid bruits. No lymphadenopathy.  No JVD. Abdomen: Bowel sounds present, abdomen soft and non-tender without masses or hernias noted. Msk:  No spine or cva tenderness. No weakness, no joint deformities or effusions. Extremities: No clubbing, cyanosis. 1-2+ BL LE edema. DP/PT/Radials 2+ and equal bilaterally. Neuro: Alert and oriented X 3. No focal deficits noted. Psych:  Good affect, responds appropriately Skin: mild   Labs:   Lab Results    Component Value Date   WBC 8.3 10/25/2015   HGB 12.7* 10/25/2015   HCT 38.8* 10/25/2015   MCV 83.6 10/25/2015   PLT 278 10/25/2015   No results for input(s): INR in the last 72 hours.   Recent Labs Lab 10/26/15 0328  NA 127*  K 3.7  CL 94*  CO2 20*  BUN 30*  CREATININE 1.40*  CALCIUM 8.4*  GLUCOSE 287*  ALBUMIN 2.7*   No results found for: MG No results for input(s): CKTOTAL, CKMB, TROPONINI in the last 72 hours.  Recent Labs  10/24/15 2345  TROPIPOC 0.02   PRO B NATRIURETIC PEPTIDE (BNP)  Date/Time Value Ref Range Status  07/09/2013 09:20 PM 95.5 0 - 125 pg/mL Final  11/08/2010 12:14 AM 96.2 0 - 125 pg/mL Final   Lab Results  Component Value Date   CHOL 176 04/13/2010   HDL 64 04/13/2010   LDLCALC 95 04/13/2010   TRIG 85 04/13/2010  No results found for: VITAMINB12, FOLATE, FERRITIN, TIBC, IRON, RETICCTPCT  Echo: 10/25/15  LV EF: 55% - 60%  ------------------------------------------------------------------- Indications: CHF - 428.0.  ------------------------------------------------------------------- History: PMH: Pulmonary edema. Risk factors: Hypertension. Dyslipidemia.  ------------------------------------------------------------------- Study Conclusions  - Left ventricle: The cavity size was normal. There was mild  concentric hypertrophy. Systolic function was normal. The  estimated ejection fraction was in the range of 55% to 60%.  Although no diagnostic regional wall motion abnormality was  identified, this possibility cannot be completely excluded on the  basis of this study. Features are consistent with a pseudonormal  left ventricular filling pattern, with concomitant abnormal  relaxation and increased filling pressure (grade 2 diastolic  dysfunction). - Left atrium: The atrium was mildly dilated.  ECG:  EKG shows sinus rhythm and rate of 102 bpm  Radiology:  Dg Chest 2 View  10/25/2015  CLINICAL DATA:  Shortness  breath, congestion today. History asthma, hypertension and obesity. EXAM: CHEST  2 VIEW COMPARISON:  Chest radiograph July 09, 2013 FINDINGS: Habitus limited examination. Cardiac silhouette appears mild to moderately enlarged. Mediastinal silhouette is nonsuspicious, calcified aortic knob. Mild pulmonary vascular congestion without pleural effusion or focal consolidation the posterior costophrenic angle is not included in field of view. No pneumothorax. Osseous structures are nonsuspicious. IMPRESSION: Cardiomegaly and pulmonary vascular congestion. Electronically Signed   By: Elon Alas M.D.   On: 10/25/2015 00:11   Dg Chest Port 1 View  10/26/2015  CLINICAL DATA:  Shortness of Breath EXAM: PORTABLE CHEST 1 VIEW COMPARISON:  October 24, 2015 FINDINGS: There is mild atelectasis in the right base. There is no edema or consolidation. There is mild cardiomegaly with mild pulmonary venous hypertension. No adenopathy. There is atherosclerotic calcification in the aorta. IMPRESSION: Evidence of pulmonary vascular congestion without edema or consolidation. There is mild right base atelectasis. There is aortic atherosclerosis. Electronically Signed   By: Lowella Grip III M.D.   On: 10/26/2015 07:00    ASSESSMENT AND PLAN:     1. Acute diastolic dysfunction - Echo during this admission shows normal left ventricular function of 0000000, grade 2 diastolic dysfunction, mildly dilated left atrium. BNP was normal during admission. He has not taken his Lasix 80 mg daily for the past few weeks. Chest x-ray shows evidence of pulmonary vascular congestion without edema or consolidation. No rales on exam however has diffuse breath sound mostly on the right bases. He is diuresed -3.2 L on IV lasix. He has a chronic lower extremity edema and history of venous stasis disease.  2. Acute on chronic respiratory failure with hypoxia - Likely due to sepsis secondary to community-acquired pneumonia - Per primary  3.  Hyperlipidemia -  Lipid panel in morning. Continue statin.  4. HTN - Home dose of Cozaar 100 mg and amlodipine 10 mg on hold due to the relatively low blood pressure.  5. OSA  - Previously diagnosis but never initiated CPAP. Consider repeat study if agrees to compliance with CPAP.  6. Obesity - Body mass index is 56.88 kg/(m^2). Lost ~50lb in last one year.   7. Coronary artery disease on CT angiography chest 05/2008. - No anginal pain.   8. Hyperglycemia - Pending A!C  Dispo: Given medical compliance education. Heart healthy diet and regular exercise. Encourage continues to lose weight.   SignedLeanor Kail, PA 10/26/2015, 11:48 AM Pager [phone]  Co-Sign MD Agree with note by Robbie Lis PA-C  Asked to see pt with ? CHF. He has increasing SOB with OSA. Admitted with CAP. On ATBX. He stopped taking his diuretics several weeks ago. He also  has chronic LEE. BNP low. EF nl with DD. I/O neg 3L with IV diuresis. Would put back on home diuretic dose and encourage compliance with his meds. Low salt diet. No further rec. Will see aagin as needed.   Lorretta Harp, M.D., [place], [facility], [place] [address] [place], [place]  [address]  [phone] 10/26/2015 12:47 PM

## 2015-10-27 LAB — BASIC METABOLIC PANEL
Anion gap: 9 (ref 5–15)
BUN: 33 mg/dL — ABNORMAL HIGH (ref 6–20)
CO2: 24 mmol/L (ref 22–32)
Calcium: 8.3 mg/dL — ABNORMAL LOW (ref 8.9–10.3)
Chloride: 96 mmol/L — ABNORMAL LOW (ref 101–111)
Creatinine, Ser: 1.32 mg/dL — ABNORMAL HIGH (ref 0.61–1.24)
GFR calc Af Amer: >60 mL/min (ref >60)
GFR calc non Af Amer: 52 mL/min — ABNORMAL LOW (ref >60)
Glucose, Bld: 220 mg/dL — ABNORMAL HIGH (ref 65–99)
Potassium: 3.7 mmol/L (ref 3.5–5.1)
Sodium: 129 mmol/L — ABNORMAL LOW (ref 135–145)

## 2015-10-27 LAB — LIPID PANEL
Cholesterol: 148 mg/dL (ref 0–200)
HDL: 44 mg/dL (ref >40)
LDL Cholesterol: 81 mg/dL (ref 0–99)
Total CHOL/HDL Ratio: 3.4 RATIO
Triglycerides: 113 mg/dL (ref <150)
VLDL: 23 mg/dL (ref 0–40)

## 2015-10-27 LAB — CBC
HCT: 37.4 % — ABNORMAL LOW (ref 39.0–52.0)
Hemoglobin: 12.4 g/dL — ABNORMAL LOW (ref 13.0–17.0)
MCH: 27.1 pg (ref 26.0–34.0)
MCHC: 33.2 g/dL (ref 30.0–36.0)
MCV: 81.7 fL (ref 78.0–100.0)
Platelets: 323 10*3/uL (ref 150–400)
RBC: 4.58 MIL/uL (ref 4.22–5.81)
RDW: 14 % (ref 11.5–15.5)
WBC: 12.9 10*3/uL — ABNORMAL HIGH (ref 4.0–10.5)

## 2015-10-27 LAB — GLUCOSE, CAPILLARY
Glucose-Capillary: 170 mg/dL — ABNORMAL HIGH (ref 65–99)
Glucose-Capillary: 197 mg/dL — ABNORMAL HIGH (ref 65–99)
Glucose-Capillary: 313 mg/dL — ABNORMAL HIGH (ref 65–99)
Glucose-Capillary: 247 mg/dL — ABNORMAL HIGH (ref 65–99)

## 2015-10-27 LAB — LEGIONELLA PNEUMOPHILA TOTAL AB: Legionella Pneumo Total Ab: <0.91 OD ratio (ref 0.00–0.90)

## 2015-10-27 MED ORDER — ENOXAPARIN SODIUM 100 MG/ML ~~LOC~~ SOLN: 0.5000 mg/kg | SUBCUTANEOUS | Status: DC

## 2015-10-27 MED ORDER — BENZONATATE 100 MG PO CAPS
200.0000 mg | ORAL_CAPSULE | Freq: Three times a day (TID) | ORAL | Status: DC | PRN
  Administered 2015-10-27 – 2015-10-28 (×3): 200 mg via ORAL
  Filled 2015-10-27 (×3): qty 2

## 2015-10-27 MED ORDER — ZOLPIDEM TARTRATE 5 MG PO TABS
5.0000 mg | ORAL_TABLET | Freq: Once | ORAL | Status: AC
  Administered 2015-10-27: 5 mg via ORAL
  Filled 2015-10-27: qty 1

## 2015-10-27 NOTE — Progress Notes (Signed)
Inpatient Diabetes Program Recommendations  AACE/ADA: New Consensus Statement on Inpatient Glycemic Control (2015)  Target Ranges:  Prepandial:   less than 140 mg/dL      Peak postprandial:   less than 180 mg/dL (1-2 hours)      Critically ill patients:  140 - 180 mg/dL   Lab Results  Component Value Date   GLUCAP 170* 10/27/2015   HGBA1C 6.3* 01/04/2010    Review of Glycemic ControlResults for MEKEL, BRIDEWELL (MRN [id number]) as of 10/27/2015 13:23  Ref. Range 10/26/2015 12:18 10/26/2015 17:08 10/26/2015 23:27 10/27/2015 07:32  Glucose-Capillary Latest Ref Range: 65-99 mg/dL 245 (H) 285 (H) 312 (H) 170 (H)   Diabetes history: None Outpatient Diabetes medications: None Current orders for Inpatient glycemic control:  Novolog sensitive tid with meals, Solumedrol 40 mg daily  Inpatient Diabetes Program Recommendations:    Please consider increasing Novolog correction to resistant while patient is on steroids.  May also consider adding Levemir 20 units daily while patient is on steroids.  A1C pending.    Thanks, Adah Perl, RN, BC-ADM Inpatient Diabetes Coordinator Pager [phone] (8a-5p)

## 2015-10-27 NOTE — Evaluation (Signed)
Physical Therapy Evaluation Patient Details Name: Brian Silva MRN: [id number] DOB: 1940/07/27 Today's Date: 10/27/2015   History of Present Illness  Pt is a 75 y/o M who presented with main concern of several days of worsening dyspnea and productive cough.  Admitting dx: acute on chronic respiratory failure with acute asthma exacerbation and sepsis secondary to CAP.  Pt's PMH includes chronic respiratory failure on 2L O2, obesity, depression.    Clinical Impression  Pt admitted with above diagnosis. Pt currently with functional limitations due to the deficits listed below (see PT Problem List). Brian Silva has poor awareness of his deficits and is insistent to return home at d/c despite currently requiring min assist for safe transfer and short distance (8 ft) ambulation using RW.  PTA pt unable to ambulate short distances in home due to SOB and has been having his neighbor do his grocery shopping.  Recommending SNF at d/c as pt currently living alone with no additional assist available.  If he refuses SNF he will need HHPT, recommend maxing out [place] [facility] services. Pt will benefit from skilled PT to increase their independence and safety with mobility to allow discharge to the venue listed below.      Follow Up Recommendations SNF;Supervision for mobility/OOB    Equipment Recommendations  None recommended by PT    Recommendations for Other Services OT consult     Precautions / Restrictions Precautions Precautions: Fall;Other (comment) Precaution Comments: monitor O2 Restrictions Weight Bearing Restrictions: No      Mobility  Bed Mobility Overal bed mobility: Needs Assistance Bed Mobility: Supine to Sit;Sit to Supine     Supine to sit: Min guard;HOB elevated Sit to supine: Min guard   General bed mobility comments: Pt reaches out for therapist for supine>sit and says "I will need help with my legs" for sit>supine.  Pt able to complete bed mobility w/ increased time and effort and  cues for pursed lip breathing as pt holding his breath.  Transfers Overall transfer level: Needs assistance Equipment used: Rolling walker (2 wheeled) Transfers: Sit to/from Stand Sit to Stand: Min assist         General transfer comment: Assist to steady and cues for hand placement.   Ambulation/Gait Ambulation/Gait assistance: Min assist Ambulation Distance (Feet): 8 Feet (1 seated rest break on BSC) Assistive device: Rolling walker (2 wheeled) Gait Pattern/deviations: Step-through pattern;Decreased stride length;Trunk flexed Gait velocity: decreased   General Gait Details: Flexed posture and cues for pursed lip breathing.  SpO2 remains at or above 93% on 2L O2.  HR up to 140 but lead off so question reliability.     Stairs            Wheelchair Mobility    Modified Rankin (Stroke Patients Only)       Balance Overall balance assessment: Needs assistance Sitting-balance support: Feet supported;Single extremity supported Sitting balance-Leahy Scale: Poor Sitting balance - Comments: Relies on UE support sitting EOB   Standing balance support: Bilateral upper extremity supported;During functional activity Standing balance-Leahy Scale: Poor Standing balance comment: Relies on UE support from RW for static standing                             Pertinent Vitals/Pain Pain Assessment: No/denies pain    Home Living Family/patient expects to be discharged to:: Private residence Living Arrangements: Alone Available Help at Discharge: Other (Comment) (pt reports no friends/family available to assist at d/c) Type of Home:  Apartment Home Access: Stairs to enter   Entrance Stairs-Number of Steps: 1 Home Layout: One level Home Equipment: Walker - 2 wheels      Prior Function Level of Independence: Independent         Comments: Still driving. Prior to onset of symptoms pt reports he could ambulate to mailbox and back before resting.  Pt unable to  ambulate once symptoms began due to productive cough.  Has been having his neighbor do his grocery shopping for him.     Hand Dominance   Dominant Hand: Right    Extremity/Trunk Assessment   Upper Extremity Assessment: Defer to OT evaluation           Lower Extremity Assessment: Overall WFL for tasks assessed         Communication   Communication: No difficulties  Cognition Arousal/Alertness: Awake/alert Behavior During Therapy: WFL for tasks assessed/performed Overall Cognitive Status:  (poor safety awareness and poor awareness of deficits)                      General Comments General comments (skin integrity, edema, etc.): Pt w/ poor unawareness of his deficits.  When asked how he will walk around his home he says, "I will get stronger".  DOE 3/4 with activity.    Exercises        Assessment/Plan    PT Assessment Patient needs continued PT services  PT Diagnosis Difficulty walking   PT Problem List Decreased activity tolerance;Decreased balance;Decreased knowledge of use of DME;Decreased safety awareness;Cardiopulmonary status limiting activity;Obesity  PT Treatment Interventions DME instruction;Gait training;Stair training;Functional mobility training;Therapeutic activities;Therapeutic exercise;Balance training;Patient/family education   PT Goals (Current goals can be found in the Care Plan section) Acute Rehab PT Goals Patient Stated Goal: to go home PT Goal Formulation: With patient Time For Goal Achievement: 11/10/15 Potential to Achieve Goals: Good    Frequency Min 3X/week   Barriers to discharge Inaccessible home environment;Decreased caregiver support Lives alone with one step to enter home    Co-evaluation               End of Session Equipment Utilized During Treatment: Oxygen Activity Tolerance: Patient limited by fatigue;Treatment limited secondary to medical complications (Comment) (DOE) Patient left: in bed;Other (comment);with  bed alarm set;with call bell/phone within reach (in chair position; pt refused sitting in chair) Nurse Communication: Mobility status         Time: [id number] PT Time Calculation (min) (ACUTE ONLY): 40 min   Charges:   PT Evaluation $PT Eval Moderate Complexity: 1 Procedure PT Treatments $Therapeutic Activity: 23-37 mins   PT G Codes:       Collie Siad PT, DPT  Pager: [phone] Phone: [phone] 10/27/2015, 4:51 PM

## 2015-10-27 NOTE — Care Management Important Message (Signed)
Important Message  Patient Details  Name: Brian Silva MRN: [id number] Date of Birth: 1941-03-28   Medicare Important Message Given:  Yes    Loann Quill 10/27/2015, 10:20 AM

## 2015-10-27 NOTE — Progress Notes (Signed)
Patient ID: Brian Silva, male   DOB: [birth year], 75 y.o.   MRN: [id number]    PROGRESS NOTE    Brian Silva  [id number] DOB: [birth year] DOA: 10/24/2015  PCP: Benito Mccreedy, MD   Brief Narrative:  75 y.o. male with HTN, chronic respiratory failure on 2 L nasal cannula oxygen (per record review but pt says he has not been using it consistently), asthma, obesity; who presented with main concern of several days duration of progressively worsening dyspnea that presented initially with exertion and has progressed to dyspnea at rest, associated with diaphoresis, subjective fevers and chills, productive cough of yellow sputum.   Assessment & Plan:   Acute on chronic respiratory failure with hypoxia, oxygen dependent at baseline 2 L [place] - suspect this is multifactorial and secondary to acute on chronic diastolic CHF, ? CAP, unknown pathogen, ? Asthma exacerbation  - respiratory status better this AM but still with exertional dyspnea  - currently on Zithromax and Rocephin for CAP and Lasix 40 mg IV BID for CHF - I will continue same regimen for now and reassess in AM  Sepsis secondary to community-acquired pneumonia, unknown pathogen  - Patient presented with hyper and hypothermia, tachycardia with HR > 90 and RR in high 20's and low 30's - Follow-up blood and sputum culture - continue Ceftriaxone and azithromycin day #4  Acute on chronic diastolic CHF - EF [id number] with grade II diastolic CHF per ECHO done on this admission - weight 396 --> 399 lbs this AM, cardiology consulted, no further recommendations  - currently on Lasix 40 mg IV BID - continue to monitor daily weights, strict I/O  Acute asthma exacerbation, Acute on chronic - solumedrol 40 mg IV QD started 7/5, minimal wheezing on today's exam  - continue BD's scheduled and as needed   Acute kidney injury - appears to be pre renal in etiology - Cr trending down  - hold losartan for now  - BMP In AM  Essential  hypertension  - SBP in 90's - stopped Norvasc and Losartan until BP stabilizes 7/5 - cut dose of Clonidine down from 0.3 to 0.1 mg PO QD  Hyponatremia  - improving - continue to monitor   Depression - Continue Celexa  Hyperlipidemia - Continue atorvastatin   Hyperglycemia  - steroid induced - check A1C   Morbid obesity due to excess calories  - Body mass index is 57.26 kg/(m^2).  DVT prophylaxis: Lovenox SQ Code Status: Full  Family Communication: Patient at bedside  Disposition Plan: Home in 1-2 days   Consultants:   Cardiology   Procedures:   None  Antimicrobials:   Zithromax 7/3 -->  Rocephin 7/3 -->  Subjective: Pt still with significant dyspnea, productive cough but better since admission.   Objective: Filed Vitals:   10/26/15 2034 10/26/15 2344 10/27/15 0336 10/27/15 0500  BP: 112/56 103/53 98/47   Pulse: 80  64   Temp: 98.2 F (36.8 C) 97.4 F (36.3 C) 97.4 F (36.3 C)   TempSrc: Oral Oral Oral   Resp: 20  23   Height:      Weight:    181 kg (399 lb 0.5 oz)  SpO2: 97%  97%     Intake/Output Summary (Last 24 hours) at 10/27/15 0642 Last data filed at 10/27/15 0424  Gross per 24 hour  Intake   1184 ml  Output   3625 ml  Net  -2441 ml   Filed Weights   10/25/15 0209 10/26/15 0500 10/27/15 0500  Weight: 181 kg (399 lb 0.5 oz) 179.8 kg (396 lb 6.2 oz) 181 kg (399 lb 0.5 oz)    Examination:  General exam: Appears calm and comfortable  Respiratory system: mild exp wheezing noted, rhonchi in both lobes Cardiovascular system: S1 & S2 heard, RRR. No JVD, rubs, gallops or clicks.  Gastrointestinal system: Abdomen is nondistended, soft and nontender. No organomegaly or masses felt.  Central nervous system: Alert and oriented. No focal neurological deficits. Extremities: Symmetric 5 x 5 power.  Data Reviewed: I have personally reviewed following labs and imaging studies  CBC:  Recent Labs Lab 10/24/15 2329 10/25/15 0422 10/27/15 0400   WBC 8.5 8.3 12.9*  NEUTROABS 7.0 7.8*  --   HGB 12.0* 12.7* 12.4*  HCT 36.5* 38.8* 37.4*  MCV 83.5 83.6 81.7  PLT 260 278 [id number]   Basic Metabolic Panel:  Recent Labs Lab 10/24/15 2329 10/25/15 0422 10/26/15 0326 10/26/15 0328 10/27/15 0400  NA 129* 129* 126* 127* 129*  K 3.7 3.6 3.9 3.7 3.7  CL 98* 97* 95* 94* 96*  CO2 21* 21* 19* 20* 24  GLUCOSE 174* 305* 286* 287* 220*  BUN 15 17 29* 30* 33*  CREATININE 1.33* 1.31* 1.38* 1.40* 1.32*  CALCIUM 8.1* 8.2* 8.3* 8.4* 8.3*  PHOS  --   --  3.7 3.7  --    Liver Function Tests:  Recent Labs Lab 10/26/15 0326 10/26/15 0328  ALBUMIN 2.6* 2.7*   Urine analysis:    Component Value Date/Time   COLORURINE YELLOW 10/25/2015 0058   APPEARANCEUR CLEAR 10/25/2015 0058   LABSPEC 1.010 10/25/2015 0058   PHURINE 5.5 10/25/2015 0058   GLUCOSEU NEGATIVE 10/25/2015 0058   HGBUR SMALL* 10/25/2015 0058   HGBUR negative 01/04/2010 1409   BILIRUBINUR NEGATIVE 10/25/2015 0058   KETONESUR NEGATIVE 10/25/2015 0058   PROTEINUR NEGATIVE 10/25/2015 0058   UROBILINOGEN 0.2 03/30/2012 2227   NITRITE NEGATIVE 10/25/2015 0058   LEUKOCYTESUR NEGATIVE 10/25/2015 0058   Recent Results (from the past 240 hour(s))  Culture, blood (Routine X 2) w Reflex to ID Panel     Status: None (Preliminary result)   Collection Time: 10/25/15 12:10 AM  Result Value Ref Range Status   Specimen Description BLOOD LEFT HAND  Final   Special Requests BOTTLES DRAWN AEROBIC AND ANAEROBIC 5ML  Final   Culture NO GROWTH 1 DAY  Final   Report Status PENDING  Incomplete  Culture, blood (Routine X 2) w Reflex to ID Panel     Status: None (Preliminary result)   Collection Time: 10/25/15 12:15 AM  Result Value Ref Range Status   Specimen Description BLOOD RIGHT ARM  Final   Special Requests IN PEDIATRIC BOTTLE 4ML  Final   Culture NO GROWTH 1 DAY  Final   Report Status PENDING  Incomplete  Urine culture     Status: Abnormal   Collection Time: 10/25/15 12:58 AM  Result  Value Ref Range Status   Specimen Description URINE, RANDOM  Final   Special Requests NONE  Final   Culture MULTIPLE SPECIES PRESENT, SUGGEST RECOLLECTION (A)  Final   Report Status 10/26/2015 FINAL  Final  MRSA PCR Screening     Status: None   Collection Time: 10/25/15  2:15 AM  Result Value Ref Range Status   MRSA by PCR NEGATIVE NEGATIVE Final    Comment:        The GeneXpert MRSA Assay (FDA approved for NASAL specimens only), is one component of a comprehensive MRSA colonization surveillance program.  It is not intended to diagnose MRSA infection nor to guide or monitor treatment for MRSA infections.       Radiology Studies: Dg Chest 2 View 10/25/2015 Cardiomegaly and pulmonary vascular congestion.   Dg Chest Port 1 View 10/26/2015 Evidence of pulmonary vascular congestion without edema or consolidation. There is mild right base atelectasis. There is aortic atherosclerosis.   Scheduled Meds: . atorvastatin  20 mg Oral QHS  . azithromycin  500 mg Intravenous Q24H  . cefTRIAXone (ROCEPHIN)  IV  1 g Intravenous Q24H  . citalopram  10 mg Oral Daily  . cloNIDine  0.1 mg Oral Daily  . enoxaparin (LOVENOX) injection  40 mg Subcutaneous Q24H  . furosemide  60 mg Intravenous BID  . guaiFENesin  600 mg Oral BID  . insulin aspart  0-9 Units Subcutaneous TID WC  . ipratropium-albuterol  3 mL Nebulization TID  . methylPREDNISolone (SOLU-MEDROL) injection  40 mg Intravenous Daily   Continuous Infusions:    LOS: 2 days   Time spent: 20 minutes   Faye Ramsay, MD [facility] Pager [phone]  If 7PM-7AM, please contact night-coverage [email or web address] Password [email or web address] 10/27/2015, 6:42 AM

## 2015-10-28 LAB — BASIC METABOLIC PANEL
Anion gap: 10 (ref 5–15)
BUN: 27 mg/dL — ABNORMAL HIGH (ref 6–20)
CO2: 27 mmol/L (ref 22–32)
Calcium: 8.3 mg/dL — ABNORMAL LOW (ref 8.9–10.3)
Chloride: 96 mmol/L — ABNORMAL LOW (ref 101–111)
Creatinine, Ser: 1.16 mg/dL (ref 0.61–1.24)
GFR calc Af Amer: >60 mL/min (ref >60)
GFR calc non Af Amer: >60 mL/min (ref >60)
Glucose, Bld: 169 mg/dL — ABNORMAL HIGH (ref 65–99)
Potassium: 3.6 mmol/L (ref 3.5–5.1)
Sodium: 133 mmol/L — ABNORMAL LOW (ref 135–145)

## 2015-10-28 LAB — HEMOGLOBIN A1C
Hgb A1c MFr Bld: 6.8 % — ABNORMAL HIGH (ref 4.8–5.6)
Mean Plasma Glucose: 148 mg/dL

## 2015-10-28 LAB — CBC
HCT: 39.2 % (ref 39.0–52.0)
Hemoglobin: 13.2 g/dL (ref 13.0–17.0)
MCH: 27.5 pg (ref 26.0–34.0)
MCHC: 33.7 g/dL (ref 30.0–36.0)
MCV: 81.7 fL (ref 78.0–100.0)
Platelets: 338 10*3/uL (ref 150–400)
RBC: 4.8 MIL/uL (ref 4.22–5.81)
RDW: 14.2 % (ref 11.5–15.5)
WBC: 12.2 10*3/uL — ABNORMAL HIGH (ref 4.0–10.5)

## 2015-10-28 LAB — GLUCOSE, CAPILLARY
Glucose-Capillary: 153 mg/dL — ABNORMAL HIGH (ref 65–99)
Glucose-Capillary: 201 mg/dL — ABNORMAL HIGH (ref 65–99)
Glucose-Capillary: 261 mg/dL — ABNORMAL HIGH (ref 65–99)

## 2015-10-28 MED ORDER — GUAIFENESIN ER 600 MG PO TB12: 600.0000 mg | ORAL_TABLET | Freq: Two times a day (BID) | ORAL | Status: DC

## 2015-10-28 MED ORDER — AZITHROMYCIN 500 MG PO TABS: 500.0000 mg | ORAL_TABLET | Freq: Every day | ORAL | Status: DC

## 2015-10-28 MED ORDER — BENZONATATE 200 MG PO CAPS: 200.0000 mg | ORAL_CAPSULE | Freq: Three times a day (TID) | ORAL | Status: DC | PRN

## 2015-10-28 MED ORDER — LOSARTAN POTASSIUM 50 MG PO TABS: 50.0000 mg | ORAL_TABLET | Freq: Every day | ORAL | Status: DC

## 2015-10-28 MED ORDER — LEVOFLOXACIN 500 MG PO TABS: 500.0000 mg | ORAL_TABLET | Freq: Every day | ORAL | Status: DC

## 2015-10-28 MED ORDER — PREDNISONE 10 MG PO TABS
ORAL_TABLET | ORAL | Status: DC
Start: 2015-10-28 — End: 2018-01-17

## 2015-10-28 MED ORDER — IPRATROPIUM-ALBUTEROL 0.5-2.5 (3) MG/3ML IN SOLN: 3.0000 mL | RESPIRATORY_TRACT | Status: DC | PRN

## 2015-10-28 MED ORDER — ALBUTEROL SULFATE HFA 108 (90 BASE) MCG/ACT IN AERS: 2.0000 | INHALATION_SPRAY | Freq: Four times a day (QID) | RESPIRATORY_TRACT | Status: AC | PRN

## 2015-10-28 MED ORDER — ALBUTEROL SULFATE HFA 108 (90 BASE) MCG/ACT IN AERS: 2.0000 | INHALATION_SPRAY | Freq: Four times a day (QID) | RESPIRATORY_TRACT | Status: DC | PRN

## 2015-10-28 MED ORDER — PREDNISONE 10 MG PO TABS: ORAL_TABLET | ORAL | Status: DC

## 2015-10-28 NOTE — Progress Notes (Signed)
PT Cancellation Note  Patient Details Name: Brian Silva MRN: [id number] DOB: 1940-10-27   Cancelled Treatment:    Reason Eval/Treat Not Completed: Patient declined, no reason specified.  Pt refuses therapy saying, "I didn't sleep at all last night, I just want to try to get at least 1 hour of sleep today.  I'm don't even want to talk to anyone".  PT will continue to follow acutely.   Collie Siad PT, DPT  Pager: [phone] Phone: [phone] 10/28/2015, 10:46 AM

## 2015-10-28 NOTE — Clinical Social Work Note (Signed)
Consult received for SNF placement, however per case manager's note, patient refused. Brian Silva discharging home today and cab transport requested. Taxi voucher and cab company phone number provided to patient's nurse.  CSW signing off as patient discharging home today.  Sherrita Riederer Givens, MSW, LCSW Licensed Clinical Social Worker [place] [phone]

## 2015-10-28 NOTE — Discharge Summary (Signed)
Physician Discharge Summary  Brian Silva [id number] DOB: 02/13/1941 DOA: 10/24/2015  PCP: Benito Mccreedy, MD  Admit date: 10/24/2015 Discharge date: 10/28/2015  Recommendations for Outpatient Follow-up:  1. Pt will need to follow up with PCP in 1 week post discharge 2. Please obtain BMP to evaluate electrolytes and kidney function 3. Please also check CBC to evaluate Hg and Hct levels 4. Please note that pt has insisted on going home despite recommendations to go to SNF based on PT evaluation 5. Pt says he has been on his own for many years and is aware he may need more assistance at home, he explained he will hire someone to help out  6. Pt has also refused home health services 7. Pt advised to consider staying additional day in hospital but has refused and wants to be discharged today   Discharge Diagnoses:  Principal Problem:   CAP (community acquired pneumonia) Active Problems:   Essential hypertension   Asthma   Depression   Hyperlipidemia   Pulmonary edema  Discharge Condition: Stable  Diet recommendation: Heart healthy diet discussed in details   Brief Narrative:  75 y.o. male with HTN, chronic respiratory failure on 2 L nasal cannula oxygen (per record review but pt says he has not been using it consistently), asthma, obesity; who presented with main concern of several days duration of progressively worsening dyspnea that presented initially with exertion and has progressed to dyspnea at rest, associated with diaphoresis, subjective fevers and chills, productive cough of yellow sputum.   Assessment & Plan:  Acute on chronic respiratory failure with hypoxia, oxygen dependent at baseline 2 L [place] - suspect this is multifactorial and secondary to acute on chronic diastolic CHF, bibasilar CAP, unknown pathogen, ? Asthma exacerbation  - respiratory status better this AM but still with exertional dyspnea   - I have expressed concern about his exertional dyspnea but pt  explained this is better than his typical baseline - he also said he has los lots of weight and this is now much better  - currently on Zithromax and Rocephin for CAP, transition to Levaquin upon discharge to complete therapy  - continue home regimen with Lasix   Sepsis secondary to bibasilar community-acquired pneumonia, unknown pathogen  - Patient presented with hyper and hypothermia, tachycardia with HR > 90 and RR in high 20's and low 30's - continued Ceftriaxone and azithromycin day #4, transitioned oral Levaquin as noted above   Acute on chronic diastolic CHF - EF [id number] with grade II diastolic CHF per ECHO done on this admission - weight 396 --> 399 --> 393 lbs this AM, cardiology consulted, no further recommendations  - resume home regimen Lasix   Acute asthma exacerbation, Acute on chronic - solumedrol 40 mg IV QD started 7/5, minimal wheezing on today's exam  - continue BD's scheduled and as needed  - complete prednisone taper  Acute kidney injury - appears to be pre renal in etiology - Cr trending down   Essential hypertension  - SBP in 100's - stopped Norvasc and Losartan dose lowered from 100 --> 50 mg until BO stabilizes   Hyponatremia  - improving  Depression - Continue Celexa  Hyperlipidemia - Continue atorvastatin   Hyperglycemia  - steroid induced - check A1C   Morbid obesity due to excess calories  - Body mass index is 57.26 kg/(m^2).  DVT prophylaxis: Lovenox SQ Code Status: Full  Family Communication: Patient at bedside, pt says he has no family for me to update  Disposition Plan: Home   Consultants:   Cardiology  Procedures:   None  Antimicrobials:   Zithromax 7/3 -->  Rocephin 7/3 -->  Discharge Exam: Filed Vitals:   10/28/15 1300 10/28/15 1410  BP: 108/78   Pulse:  93  Temp:    Resp:  22   Filed Vitals:   10/28/15 0912 10/28/15 1249 10/28/15 1300 10/28/15 1410  BP: 134/58  108/78   Pulse:    93  Temp:  99 F (37.2  C)    TempSrc:  Oral    Resp:    22  Height:      Weight:      SpO2:    95%    General: Pt is alert, follows commands appropriately, not in acute distress Cardiovascular: Regular rate and rhythm, S1/S2 +, no rubs, no gallops Respiratory: No wheezing, diminished breath sounds at bases Abdominal: Soft, non tender, non distended, bowel sounds +, no guarding Extremities: no cyanosis, pulses palpable bilaterally DP and PT Neuro: Grossly nonfocal  Discharge Instructions  Discharge Instructions    Diet - low sodium heart healthy    Complete by:  As directed      Increase activity slowly    Complete by:  As directed             Medication List    STOP taking these medications        amLODipine 10 MG tablet  Commonly known as:  NORVASC     HYDROcodone-acetaminophen 5-325 MG tablet  Commonly known as:  NORCO/VICODIN      TAKE these medications        albuterol 108 (90 Base) MCG/ACT inhaler  Commonly known as:  PROVENTIL HFA;VENTOLIN HFA  Inhale 2 puffs into the lungs every 6 (six) hours as needed. For shortness of breath     atorvastatin 20 MG tablet  Commonly known as:  LIPITOR  Take 20 mg by mouth at bedtime.     benzonatate 200 MG capsule  Commonly known as:  TESSALON  Take 1 capsule (200 mg total) by mouth 3 (three) times daily as needed for cough.     citalopram 10 MG tablet  Commonly known as:  CELEXA  Take 10 mg by mouth daily.     cloNIDine 0.3 MG tablet  Commonly known as:  CATAPRES  Take 0.3 mg by mouth daily.     furosemide 80 MG tablet  Commonly known as:  LASIX  Take 80 mg by mouth daily.     guaiFENesin 600 MG 12 hr tablet  Commonly known as:  MUCINEX  Take 1 tablet (600 mg total) by mouth 2 (two) times daily.     ipratropium-albuterol 0.5-2.5 (3) MG/3ML Soln  Commonly known as:  DUONEB  Take 3 mLs by nebulization every 2 (two) hours as needed.     levofloxacin 500 MG tablet  Commonly known as:  LEVAQUIN  Take 1 tablet (500 mg total) by  mouth daily.     losartan 50 MG tablet  Commonly known as:  COZAAR  Take 1 tablet (50 mg total) by mouth daily.     predniSONE 10 MG tablet  Commonly known as:  DELTASONE  Take 40 mg tablet on 10/29/2015 and taper down by 10 mg daily until completed            Follow-up Information    Follow up with OSEI-BONSU,GEORGE, MD.   Specialty:  Internal Medicine   Contact information:   [id number]  [place] [address] [phone]       Call Faye Ramsay, MD.   Specialty:  Internal Medicine   Why:  As needed call my cell [phone]   Contact information:   [address] [place] [address] [phone]        The results of significant diagnostics from this hospitalization (including imaging, microbiology, ancillary and laboratory) are listed below for reference.     Microbiology: Recent Results (from the past 240 hour(s))  Culture, blood (Routine X 2) w Reflex to ID Panel     Status: None (Preliminary result)   Collection Time: 10/25/15 12:10 AM  Result Value Ref Range Status   Specimen Description BLOOD LEFT HAND  Final   Special Requests BOTTLES DRAWN AEROBIC AND ANAEROBIC 5ML  Final   Culture NO GROWTH 2 DAYS  Final   Report Status PENDING  Incomplete  Culture, blood (Routine X 2) w Reflex to ID Panel     Status: None (Preliminary result)   Collection Time: 10/25/15 12:15 AM  Result Value Ref Range Status   Specimen Description BLOOD RIGHT ARM  Final   Special Requests IN PEDIATRIC BOTTLE 4ML  Final   Culture NO GROWTH 2 DAYS  Final   Report Status PENDING  Incomplete  Urine culture     Status: Abnormal   Collection Time: 10/25/15 12:58 AM  Result Value Ref Range Status   Specimen Description URINE, RANDOM  Final   Special Requests NONE  Final   Culture MULTIPLE SPECIES PRESENT, SUGGEST RECOLLECTION (A)  Final   Report Status 10/26/2015 FINAL  Final  MRSA PCR Screening     Status: None   Collection Time: 10/25/15   2:15 AM  Result Value Ref Range Status   MRSA by PCR NEGATIVE NEGATIVE Final    Comment:        The GeneXpert MRSA Assay (FDA approved for NASAL specimens only), is one component of a comprehensive MRSA colonization surveillance program. It is not intended to diagnose MRSA infection nor to guide or monitor treatment for MRSA infections.      Labs: Basic Metabolic Panel:  Recent Labs Lab 10/25/15 0422 10/26/15 0326 10/26/15 0328 10/27/15 0400 10/28/15 0254  NA 129* 126* 127* 129* 133*  K 3.6 3.9 3.7 3.7 3.6  CL 97* 95* 94* 96* 96*  CO2 21* 19* 20* 24 27  GLUCOSE 305* 286* 287* 220* 169*  BUN 17 29* 30* 33* 27*  CREATININE 1.31* 1.38* 1.40* 1.32* 1.16  CALCIUM 8.2* 8.3* 8.4* 8.3* 8.3*  PHOS  --  3.7 3.7  --   --    Liver Function Tests:  Recent Labs Lab 10/26/15 0326 10/26/15 0328  ALBUMIN 2.6* 2.7*   CBC:  Recent Labs Lab 10/24/15 2329 10/25/15 0422 10/27/15 0400 10/28/15 0254  WBC 8.5 8.3 12.9* 12.2*  NEUTROABS 7.0 7.8*  --   --   HGB 12.0* 12.7* 12.4* 13.2  HCT 36.5* 38.8* 37.4* 39.2  MCV 83.5 83.6 81.7 81.7  PLT 260 278 323 338   BNP (last 3 results)  Recent Labs  10/24/15 2329  BNP 66.7   CBG:  Recent Labs Lab 10/27/15 1300 10/27/15 1652 10/27/15 2120 10/28/15 0817 10/28/15 1249  GLUCAP 197* 313* 247* 153* 201*   SIGNED: Time coordinating discharge: 30 minutes  MAGICK-Shenna Brissette, MD  [facility] 10/28/2015, 4:07 PM Pager [phone]  If 7PM-7AM, please contact night-coverage [email or web address] Password [email or web address]

## 2015-10-28 NOTE — Care Management Note (Addendum)
Case Management Note  Patient Details  Name: Brian Silva MRN: [id number] Date of Birth: [birth year]  Subjective/Objective:    Pt lives alone, PT recommends [facility] for rehab.  Discussed with pt and he refuses, states he will get an aide to assist him, states he had an aide years ago through [facility].  Discussed the process of application for [facility], same will not be arranged quickly.  Pt does not want a list of private duty aide agencies, states he cannot afford to pay privately.  Pt also is refusing home health therapy, states he only wants an aide to assist.  Provided pt with an application for [facility].  He states he will request Dr Emilee Hero Bonsu assist him with application.                             Expected Discharge Plan:  Skilled Nursing Facility  In-House Referral:  Clinical Social Work  Discharge planning Services  CM Consult  Status of Service:  In process, will continue to follow  Girard Cooter, RN 10/28/2015, 2:47 PM  10/28/2015 4:36 PM:  Received referral for assistance with transportation and offered ambulance transport - pt refuses.

## 2015-10-28 NOTE — Discharge Instructions (Signed)

## 2015-10-28 NOTE — Progress Notes (Signed)
PHARMACIST - PHYSICIAN COMMUNICATION DR:   Doyle Askew CONCERNING: Antibiotic IV to Oral Route Change Policy  RECOMMENDATION: This patient is receiving Azithomycin by the intravenous route.  Based on criteria approved by the Pharmacy and Therapeutics Committee, the antibiotic(s) is/are being converted to the equivalent oral dose form(s).   DESCRIPTION: These criteria include:  Patient being treated for a respiratory tract infection, urinary tract infection, cellulitis or clostridium difficile associated diarrhea if on metronidazole  The patient is not neutropenic and does not exhibit a GI malabsorption state  The patient is eating (either orally or via tube) and/or has been taking other orally administered medications for a least 24 hours  The patient is improving clinically and has a Tmax < 100.5  If you have questions about this conversion, please contact the Pharmacy Department  []   [phone] )  [facility] []   [phone] )  [facility] [x]   [phone] )  [facility] []   [phone] )  [facility] []   [phone] )  [place], PharmD, BCPS Clinical Pharmacist Pager: [phone] 10/28/2015 11:08 AM

## 2015-10-30 LAB — CULTURE, BLOOD (ROUTINE X 2)
Culture: NO GROWTH
Culture: NO GROWTH

## 2015-11-11 DIAGNOSIS — R7303 Prediabetes: Secondary | ICD-10-CM | POA: Diagnosis not present

## 2015-11-11 DIAGNOSIS — J45909 Unspecified asthma, uncomplicated: Secondary | ICD-10-CM | POA: Diagnosis not present

## 2015-11-11 DIAGNOSIS — I509 Heart failure, unspecified: Secondary | ICD-10-CM | POA: Diagnosis not present

## 2015-11-11 DIAGNOSIS — E559 Vitamin D deficiency, unspecified: Secondary | ICD-10-CM | POA: Diagnosis not present

## 2015-11-11 DIAGNOSIS — I10 Essential (primary) hypertension: Secondary | ICD-10-CM | POA: Diagnosis not present

## 2015-11-11 DIAGNOSIS — F329 Major depressive disorder, single episode, unspecified: Secondary | ICD-10-CM | POA: Diagnosis not present

## 2015-11-11 DIAGNOSIS — E785 Hyperlipidemia, unspecified: Secondary | ICD-10-CM | POA: Diagnosis not present

## 2015-11-11 DIAGNOSIS — E119 Type 2 diabetes mellitus without complications: Secondary | ICD-10-CM | POA: Diagnosis not present

## 2015-12-21 DIAGNOSIS — F329 Major depressive disorder, single episode, unspecified: Secondary | ICD-10-CM | POA: Diagnosis not present

## 2015-12-21 DIAGNOSIS — I509 Heart failure, unspecified: Secondary | ICD-10-CM | POA: Diagnosis not present

## 2015-12-21 DIAGNOSIS — I10 Essential (primary) hypertension: Secondary | ICD-10-CM | POA: Diagnosis not present

## 2015-12-21 DIAGNOSIS — E559 Vitamin D deficiency, unspecified: Secondary | ICD-10-CM | POA: Diagnosis not present

## 2015-12-21 DIAGNOSIS — L298 Other pruritus: Secondary | ICD-10-CM | POA: Diagnosis not present

## 2015-12-21 DIAGNOSIS — E785 Hyperlipidemia, unspecified: Secondary | ICD-10-CM | POA: Diagnosis not present

## 2015-12-21 DIAGNOSIS — J45909 Unspecified asthma, uncomplicated: Secondary | ICD-10-CM | POA: Diagnosis not present

## 2015-12-21 DIAGNOSIS — E119 Type 2 diabetes mellitus without complications: Secondary | ICD-10-CM | POA: Diagnosis not present

## 2016-04-27 DIAGNOSIS — F329 Major depressive disorder, single episode, unspecified: Secondary | ICD-10-CM | POA: Diagnosis not present

## 2016-04-27 DIAGNOSIS — E119 Type 2 diabetes mellitus without complications: Secondary | ICD-10-CM | POA: Diagnosis not present

## 2016-04-27 DIAGNOSIS — E559 Vitamin D deficiency, unspecified: Secondary | ICD-10-CM | POA: Diagnosis not present

## 2016-04-27 DIAGNOSIS — M19041 Primary osteoarthritis, right hand: Secondary | ICD-10-CM | POA: Diagnosis not present

## 2016-04-27 DIAGNOSIS — Z23 Encounter for immunization: Secondary | ICD-10-CM | POA: Diagnosis not present

## 2016-04-27 DIAGNOSIS — E785 Hyperlipidemia, unspecified: Secondary | ICD-10-CM | POA: Diagnosis not present

## 2016-04-27 DIAGNOSIS — I509 Heart failure, unspecified: Secondary | ICD-10-CM | POA: Diagnosis not present

## 2016-04-27 DIAGNOSIS — J45909 Unspecified asthma, uncomplicated: Secondary | ICD-10-CM | POA: Diagnosis not present

## 2016-04-27 DIAGNOSIS — I10 Essential (primary) hypertension: Secondary | ICD-10-CM | POA: Diagnosis not present

## 2016-04-27 DIAGNOSIS — L298 Other pruritus: Secondary | ICD-10-CM | POA: Diagnosis not present

## 2016-05-18 DIAGNOSIS — I10 Essential (primary) hypertension: Secondary | ICD-10-CM | POA: Diagnosis not present

## 2016-05-18 DIAGNOSIS — F329 Major depressive disorder, single episode, unspecified: Secondary | ICD-10-CM | POA: Diagnosis not present

## 2016-05-18 DIAGNOSIS — E785 Hyperlipidemia, unspecified: Secondary | ICD-10-CM | POA: Diagnosis not present

## 2016-05-18 DIAGNOSIS — I509 Heart failure, unspecified: Secondary | ICD-10-CM | POA: Diagnosis not present

## 2016-05-18 DIAGNOSIS — E119 Type 2 diabetes mellitus without complications: Secondary | ICD-10-CM | POA: Diagnosis not present

## 2016-05-18 DIAGNOSIS — E559 Vitamin D deficiency, unspecified: Secondary | ICD-10-CM | POA: Diagnosis not present

## 2016-05-18 DIAGNOSIS — L298 Other pruritus: Secondary | ICD-10-CM | POA: Diagnosis not present

## 2016-05-18 DIAGNOSIS — M19041 Primary osteoarthritis, right hand: Secondary | ICD-10-CM | POA: Diagnosis not present

## 2016-05-18 DIAGNOSIS — J45909 Unspecified asthma, uncomplicated: Secondary | ICD-10-CM | POA: Diagnosis not present

## 2016-08-24 DIAGNOSIS — I10 Essential (primary) hypertension: Secondary | ICD-10-CM | POA: Diagnosis not present

## 2016-08-24 DIAGNOSIS — I509 Heart failure, unspecified: Secondary | ICD-10-CM | POA: Diagnosis not present

## 2016-08-24 DIAGNOSIS — E559 Vitamin D deficiency, unspecified: Secondary | ICD-10-CM | POA: Diagnosis not present

## 2016-08-24 DIAGNOSIS — L298 Other pruritus: Secondary | ICD-10-CM | POA: Diagnosis not present

## 2016-08-24 DIAGNOSIS — E785 Hyperlipidemia, unspecified: Secondary | ICD-10-CM | POA: Diagnosis not present

## 2016-08-24 DIAGNOSIS — F329 Major depressive disorder, single episode, unspecified: Secondary | ICD-10-CM | POA: Diagnosis not present

## 2016-08-24 DIAGNOSIS — E119 Type 2 diabetes mellitus without complications: Secondary | ICD-10-CM | POA: Diagnosis not present

## 2016-08-24 DIAGNOSIS — J45909 Unspecified asthma, uncomplicated: Secondary | ICD-10-CM | POA: Diagnosis not present

## 2016-08-24 DIAGNOSIS — Z125 Encounter for screening for malignant neoplasm of prostate: Secondary | ICD-10-CM | POA: Diagnosis not present

## 2016-08-24 DIAGNOSIS — M19041 Primary osteoarthritis, right hand: Secondary | ICD-10-CM | POA: Diagnosis not present

## 2016-10-30 DIAGNOSIS — L298 Other pruritus: Secondary | ICD-10-CM | POA: Diagnosis not present

## 2016-10-30 DIAGNOSIS — M19041 Primary osteoarthritis, right hand: Secondary | ICD-10-CM | POA: Diagnosis not present

## 2016-10-30 DIAGNOSIS — I509 Heart failure, unspecified: Secondary | ICD-10-CM | POA: Diagnosis not present

## 2016-10-30 DIAGNOSIS — E785 Hyperlipidemia, unspecified: Secondary | ICD-10-CM | POA: Diagnosis not present

## 2016-10-30 DIAGNOSIS — J45909 Unspecified asthma, uncomplicated: Secondary | ICD-10-CM | POA: Diagnosis not present

## 2016-10-30 DIAGNOSIS — E559 Vitamin D deficiency, unspecified: Secondary | ICD-10-CM | POA: Diagnosis not present

## 2016-10-30 DIAGNOSIS — I10 Essential (primary) hypertension: Secondary | ICD-10-CM | POA: Diagnosis not present

## 2016-10-30 DIAGNOSIS — F329 Major depressive disorder, single episode, unspecified: Secondary | ICD-10-CM | POA: Diagnosis not present

## 2016-10-30 DIAGNOSIS — E119 Type 2 diabetes mellitus without complications: Secondary | ICD-10-CM | POA: Diagnosis not present

## 2016-11-13 DIAGNOSIS — M19041 Primary osteoarthritis, right hand: Secondary | ICD-10-CM | POA: Diagnosis not present

## 2016-11-13 DIAGNOSIS — J45909 Unspecified asthma, uncomplicated: Secondary | ICD-10-CM | POA: Diagnosis not present

## 2016-11-13 DIAGNOSIS — E785 Hyperlipidemia, unspecified: Secondary | ICD-10-CM | POA: Diagnosis not present

## 2016-11-13 DIAGNOSIS — F329 Major depressive disorder, single episode, unspecified: Secondary | ICD-10-CM | POA: Diagnosis not present

## 2016-11-13 DIAGNOSIS — I509 Heart failure, unspecified: Secondary | ICD-10-CM | POA: Diagnosis not present

## 2016-11-13 DIAGNOSIS — E559 Vitamin D deficiency, unspecified: Secondary | ICD-10-CM | POA: Diagnosis not present

## 2016-11-13 DIAGNOSIS — E119 Type 2 diabetes mellitus without complications: Secondary | ICD-10-CM | POA: Diagnosis not present

## 2016-11-13 DIAGNOSIS — I10 Essential (primary) hypertension: Secondary | ICD-10-CM | POA: Diagnosis not present

## 2016-11-23 DIAGNOSIS — M19041 Primary osteoarthritis, right hand: Secondary | ICD-10-CM | POA: Diagnosis not present

## 2016-11-23 DIAGNOSIS — E119 Type 2 diabetes mellitus without complications: Secondary | ICD-10-CM | POA: Diagnosis not present

## 2016-11-23 DIAGNOSIS — I509 Heart failure, unspecified: Secondary | ICD-10-CM | POA: Diagnosis not present

## 2016-11-23 DIAGNOSIS — F329 Major depressive disorder, single episode, unspecified: Secondary | ICD-10-CM | POA: Diagnosis not present

## 2016-11-23 DIAGNOSIS — E785 Hyperlipidemia, unspecified: Secondary | ICD-10-CM | POA: Diagnosis not present

## 2016-11-23 DIAGNOSIS — I10 Essential (primary) hypertension: Secondary | ICD-10-CM | POA: Diagnosis not present

## 2016-11-23 DIAGNOSIS — J45909 Unspecified asthma, uncomplicated: Secondary | ICD-10-CM | POA: Diagnosis not present

## 2016-11-23 DIAGNOSIS — E559 Vitamin D deficiency, unspecified: Secondary | ICD-10-CM | POA: Diagnosis not present

## 2017-01-09 DIAGNOSIS — I10 Essential (primary) hypertension: Secondary | ICD-10-CM | POA: Diagnosis not present

## 2017-01-09 DIAGNOSIS — E119 Type 2 diabetes mellitus without complications: Secondary | ICD-10-CM | POA: Diagnosis not present

## 2017-01-09 DIAGNOSIS — J45909 Unspecified asthma, uncomplicated: Secondary | ICD-10-CM | POA: Diagnosis not present

## 2017-01-09 DIAGNOSIS — M19041 Primary osteoarthritis, right hand: Secondary | ICD-10-CM | POA: Diagnosis not present

## 2017-01-09 DIAGNOSIS — E785 Hyperlipidemia, unspecified: Secondary | ICD-10-CM | POA: Diagnosis not present

## 2017-01-09 DIAGNOSIS — I509 Heart failure, unspecified: Secondary | ICD-10-CM | POA: Diagnosis not present

## 2017-01-09 DIAGNOSIS — E559 Vitamin D deficiency, unspecified: Secondary | ICD-10-CM | POA: Diagnosis not present

## 2017-01-09 DIAGNOSIS — F329 Major depressive disorder, single episode, unspecified: Secondary | ICD-10-CM | POA: Diagnosis not present

## 2017-03-20 DIAGNOSIS — F329 Major depressive disorder, single episode, unspecified: Secondary | ICD-10-CM | POA: Diagnosis not present

## 2017-03-20 DIAGNOSIS — E785 Hyperlipidemia, unspecified: Secondary | ICD-10-CM | POA: Diagnosis not present

## 2017-03-20 DIAGNOSIS — M19041 Primary osteoarthritis, right hand: Secondary | ICD-10-CM | POA: Diagnosis not present

## 2017-03-20 DIAGNOSIS — E119 Type 2 diabetes mellitus without complications: Secondary | ICD-10-CM | POA: Diagnosis not present

## 2017-03-20 DIAGNOSIS — J45909 Unspecified asthma, uncomplicated: Secondary | ICD-10-CM | POA: Diagnosis not present

## 2017-03-20 DIAGNOSIS — E559 Vitamin D deficiency, unspecified: Secondary | ICD-10-CM | POA: Diagnosis not present

## 2017-03-20 DIAGNOSIS — I509 Heart failure, unspecified: Secondary | ICD-10-CM | POA: Diagnosis not present

## 2017-03-20 DIAGNOSIS — Z23 Encounter for immunization: Secondary | ICD-10-CM | POA: Diagnosis not present

## 2017-03-20 DIAGNOSIS — I10 Essential (primary) hypertension: Secondary | ICD-10-CM | POA: Diagnosis not present

## 2017-05-01 DIAGNOSIS — J45909 Unspecified asthma, uncomplicated: Secondary | ICD-10-CM | POA: Diagnosis not present

## 2017-05-01 DIAGNOSIS — E119 Type 2 diabetes mellitus without complications: Secondary | ICD-10-CM | POA: Diagnosis not present

## 2017-05-01 DIAGNOSIS — I10 Essential (primary) hypertension: Secondary | ICD-10-CM | POA: Diagnosis not present

## 2017-05-01 DIAGNOSIS — Z23 Encounter for immunization: Secondary | ICD-10-CM | POA: Diagnosis not present

## 2017-05-01 DIAGNOSIS — F329 Major depressive disorder, single episode, unspecified: Secondary | ICD-10-CM | POA: Diagnosis not present

## 2017-05-01 DIAGNOSIS — I509 Heart failure, unspecified: Secondary | ICD-10-CM | POA: Diagnosis not present

## 2017-05-01 DIAGNOSIS — E559 Vitamin D deficiency, unspecified: Secondary | ICD-10-CM | POA: Diagnosis not present

## 2017-05-01 DIAGNOSIS — E785 Hyperlipidemia, unspecified: Secondary | ICD-10-CM | POA: Diagnosis not present

## 2017-05-01 DIAGNOSIS — J0100 Acute maxillary sinusitis, unspecified: Secondary | ICD-10-CM | POA: Diagnosis not present

## 2017-05-01 DIAGNOSIS — M19041 Primary osteoarthritis, right hand: Secondary | ICD-10-CM | POA: Diagnosis not present

## 2017-06-12 DIAGNOSIS — E559 Vitamin D deficiency, unspecified: Secondary | ICD-10-CM | POA: Diagnosis not present

## 2017-06-12 DIAGNOSIS — F329 Major depressive disorder, single episode, unspecified: Secondary | ICD-10-CM | POA: Diagnosis not present

## 2017-06-12 DIAGNOSIS — I509 Heart failure, unspecified: Secondary | ICD-10-CM | POA: Diagnosis not present

## 2017-06-12 DIAGNOSIS — E119 Type 2 diabetes mellitus without complications: Secondary | ICD-10-CM | POA: Diagnosis not present

## 2017-06-12 DIAGNOSIS — E785 Hyperlipidemia, unspecified: Secondary | ICD-10-CM | POA: Diagnosis not present

## 2017-06-12 DIAGNOSIS — M19041 Primary osteoarthritis, right hand: Secondary | ICD-10-CM | POA: Diagnosis not present

## 2017-06-12 DIAGNOSIS — I10 Essential (primary) hypertension: Secondary | ICD-10-CM | POA: Diagnosis not present

## 2017-06-12 DIAGNOSIS — J45909 Unspecified asthma, uncomplicated: Secondary | ICD-10-CM | POA: Diagnosis not present

## 2017-09-03 DIAGNOSIS — I509 Heart failure, unspecified: Secondary | ICD-10-CM | POA: Diagnosis not present

## 2017-09-03 DIAGNOSIS — F329 Major depressive disorder, single episode, unspecified: Secondary | ICD-10-CM | POA: Diagnosis not present

## 2017-09-03 DIAGNOSIS — I10 Essential (primary) hypertension: Secondary | ICD-10-CM | POA: Diagnosis not present

## 2017-09-03 DIAGNOSIS — M19041 Primary osteoarthritis, right hand: Secondary | ICD-10-CM | POA: Diagnosis not present

## 2017-09-03 DIAGNOSIS — R21 Rash and other nonspecific skin eruption: Secondary | ICD-10-CM | POA: Diagnosis not present

## 2017-09-03 DIAGNOSIS — E559 Vitamin D deficiency, unspecified: Secondary | ICD-10-CM | POA: Diagnosis not present

## 2017-09-03 DIAGNOSIS — E785 Hyperlipidemia, unspecified: Secondary | ICD-10-CM | POA: Diagnosis not present

## 2017-09-03 DIAGNOSIS — J45909 Unspecified asthma, uncomplicated: Secondary | ICD-10-CM | POA: Diagnosis not present

## 2017-09-03 DIAGNOSIS — E119 Type 2 diabetes mellitus without complications: Secondary | ICD-10-CM | POA: Diagnosis not present

## 2017-09-17 DIAGNOSIS — I10 Essential (primary) hypertension: Secondary | ICD-10-CM | POA: Diagnosis not present

## 2017-09-17 DIAGNOSIS — R21 Rash and other nonspecific skin eruption: Secondary | ICD-10-CM | POA: Diagnosis not present

## 2017-09-17 DIAGNOSIS — I509 Heart failure, unspecified: Secondary | ICD-10-CM | POA: Diagnosis not present

## 2017-09-17 DIAGNOSIS — E119 Type 2 diabetes mellitus without complications: Secondary | ICD-10-CM | POA: Diagnosis not present

## 2017-09-17 DIAGNOSIS — E559 Vitamin D deficiency, unspecified: Secondary | ICD-10-CM | POA: Diagnosis not present

## 2017-09-17 DIAGNOSIS — J45909 Unspecified asthma, uncomplicated: Secondary | ICD-10-CM | POA: Diagnosis not present

## 2017-09-17 DIAGNOSIS — E785 Hyperlipidemia, unspecified: Secondary | ICD-10-CM | POA: Diagnosis not present

## 2017-09-17 DIAGNOSIS — F329 Major depressive disorder, single episode, unspecified: Secondary | ICD-10-CM | POA: Diagnosis not present

## 2017-09-17 DIAGNOSIS — M19041 Primary osteoarthritis, right hand: Secondary | ICD-10-CM | POA: Diagnosis not present

## 2017-09-24 DIAGNOSIS — I10 Essential (primary) hypertension: Secondary | ICD-10-CM | POA: Diagnosis not present

## 2017-09-24 DIAGNOSIS — Z136 Encounter for screening for cardiovascular disorders: Secondary | ICD-10-CM | POA: Diagnosis not present

## 2017-09-24 DIAGNOSIS — E559 Vitamin D deficiency, unspecified: Secondary | ICD-10-CM | POA: Diagnosis not present

## 2017-09-24 DIAGNOSIS — H538 Other visual disturbances: Secondary | ICD-10-CM | POA: Diagnosis not present

## 2017-09-24 DIAGNOSIS — E119 Type 2 diabetes mellitus without complications: Secondary | ICD-10-CM | POA: Diagnosis not present

## 2017-09-24 DIAGNOSIS — F329 Major depressive disorder, single episode, unspecified: Secondary | ICD-10-CM | POA: Diagnosis not present

## 2017-09-24 DIAGNOSIS — I509 Heart failure, unspecified: Secondary | ICD-10-CM | POA: Diagnosis not present

## 2017-09-24 DIAGNOSIS — M19041 Primary osteoarthritis, right hand: Secondary | ICD-10-CM | POA: Diagnosis not present

## 2017-09-24 DIAGNOSIS — Z125 Encounter for screening for malignant neoplasm of prostate: Secondary | ICD-10-CM | POA: Diagnosis not present

## 2017-09-24 DIAGNOSIS — E785 Hyperlipidemia, unspecified: Secondary | ICD-10-CM | POA: Diagnosis not present

## 2017-09-24 DIAGNOSIS — Z01118 Encounter for examination of ears and hearing with other abnormal findings: Secondary | ICD-10-CM | POA: Diagnosis not present

## 2017-09-24 DIAGNOSIS — J45909 Unspecified asthma, uncomplicated: Secondary | ICD-10-CM | POA: Diagnosis not present

## 2017-09-30 DIAGNOSIS — R21 Rash and other nonspecific skin eruption: Secondary | ICD-10-CM | POA: Diagnosis not present

## 2017-09-30 DIAGNOSIS — F329 Major depressive disorder, single episode, unspecified: Secondary | ICD-10-CM | POA: Diagnosis not present

## 2017-09-30 DIAGNOSIS — I509 Heart failure, unspecified: Secondary | ICD-10-CM | POA: Diagnosis not present

## 2017-09-30 DIAGNOSIS — E785 Hyperlipidemia, unspecified: Secondary | ICD-10-CM | POA: Diagnosis not present

## 2017-09-30 DIAGNOSIS — M19041 Primary osteoarthritis, right hand: Secondary | ICD-10-CM | POA: Diagnosis not present

## 2017-09-30 DIAGNOSIS — J45909 Unspecified asthma, uncomplicated: Secondary | ICD-10-CM | POA: Diagnosis not present

## 2017-09-30 DIAGNOSIS — E559 Vitamin D deficiency, unspecified: Secondary | ICD-10-CM | POA: Diagnosis not present

## 2017-09-30 DIAGNOSIS — I10 Essential (primary) hypertension: Secondary | ICD-10-CM | POA: Diagnosis not present

## 2017-09-30 DIAGNOSIS — E119 Type 2 diabetes mellitus without complications: Secondary | ICD-10-CM | POA: Diagnosis not present

## 2017-09-30 DIAGNOSIS — E875 Hyperkalemia: Secondary | ICD-10-CM | POA: Diagnosis not present

## 2017-10-06 ENCOUNTER — Other Ambulatory Visit: Payer: Self-pay

## 2017-10-06 ENCOUNTER — Emergency Department (HOSPITAL_COMMUNITY)
Admission: EM | Admit: 2017-10-06 | Discharge: 2017-10-06 | Disposition: A | Discharge status: Home / Self Care | Payer: Medicare Other | Attending: Emergency Medicine | Admitting: Emergency Medicine

## 2017-10-06 ENCOUNTER — Emergency Department (HOSPITAL_COMMUNITY): Payer: Medicare Other

## 2017-10-06 DIAGNOSIS — K625 Hemorrhage of anus and rectum: Secondary | ICD-10-CM | POA: Diagnosis not present

## 2017-10-06 DIAGNOSIS — R1030 Lower abdominal pain, unspecified: Secondary | ICD-10-CM | POA: Insufficient documentation

## 2017-10-06 DIAGNOSIS — Z7984 Long term (current) use of oral hypoglycemic drugs: Secondary | ICD-10-CM | POA: Diagnosis not present

## 2017-10-06 DIAGNOSIS — J45909 Unspecified asthma, uncomplicated: Secondary | ICD-10-CM | POA: Insufficient documentation

## 2017-10-06 DIAGNOSIS — Z79899 Other long term (current) drug therapy: Secondary | ICD-10-CM | POA: Insufficient documentation

## 2017-10-06 DIAGNOSIS — I10 Essential (primary) hypertension: Secondary | ICD-10-CM | POA: Insufficient documentation

## 2017-10-06 DIAGNOSIS — R1032 Left lower quadrant pain: Secondary | ICD-10-CM | POA: Diagnosis not present

## 2017-10-06 DIAGNOSIS — K5900 Constipation, unspecified: Secondary | ICD-10-CM | POA: Insufficient documentation

## 2017-10-06 LAB — COMPREHENSIVE METABOLIC PANEL
ALT: 20 U/L (ref 17–63)
AST: 17 U/L (ref 15–41)
Albumin: 3.7 g/dL (ref 3.5–5.0)
Alkaline Phosphatase: 97 U/L (ref 38–126)
Anion gap: 7 (ref 5–15)
BUN: 20 mg/dL (ref 6–20)
CO2: 23 mmol/L (ref 22–32)
Calcium: 9.2 mg/dL (ref 8.9–10.3)
Chloride: 110 mmol/L (ref 101–111)
Creatinine, Ser: 1.08 mg/dL (ref 0.61–1.24)
GFR calc Af Amer: >60 mL/min (ref >60)
GFR calc non Af Amer: >60 mL/min (ref >60)
Glucose, Bld: 116 mg/dL — ABNORMAL HIGH (ref 65–99)
Potassium: 4.7 mmol/L (ref 3.5–5.1)
Sodium: 140 mmol/L (ref 135–145)
Total Bilirubin: 0.5 mg/dL (ref 0.3–1.2)
Total Protein: 7.4 g/dL (ref 6.5–8.1)

## 2017-10-06 LAB — HEMOGLOBIN AND HEMATOCRIT, BLOOD
HCT: 36.4 % — ABNORMAL LOW (ref 39.0–52.0)
Hemoglobin: 12 g/dL — ABNORMAL LOW (ref 13.0–17.0)

## 2017-10-06 LAB — CBC WITH DIFFERENTIAL/PLATELET
Basophils Absolute: 0.1 10*3/uL (ref 0.0–0.1)
Basophils Relative: 1 %
Eosinophils Absolute: 0.3 10*3/uL (ref 0.0–0.7)
Eosinophils Relative: 4 %
HCT: 38.8 % — ABNORMAL LOW (ref 39.0–52.0)
Hemoglobin: 12.8 g/dL — ABNORMAL LOW (ref 13.0–17.0)
Lymphocytes Relative: 12 %
Lymphs Abs: 1.1 10*3/uL (ref 0.7–4.0)
MCH: 29 pg (ref 26.0–34.0)
MCHC: 33 g/dL (ref 30.0–36.0)
MCV: 88 fL (ref 78.0–100.0)
Monocytes Absolute: 0.7 10*3/uL (ref 0.1–1.0)
Monocytes Relative: 7 %
Neutro Abs: 7.1 10*3/uL (ref 1.7–7.7)
Neutrophils Relative %: 76 %
Platelets: 357 10*3/uL (ref 150–400)
RBC: 4.41 MIL/uL (ref 4.22–5.81)
RDW: 13.7 % (ref 11.5–15.5)
WBC: 9.2 10*3/uL (ref 4.0–10.5)

## 2017-10-06 LAB — LIPASE, BLOOD: Lipase: 24 U/L (ref 11–51)

## 2017-10-06 MED ORDER — POLYETHYLENE GLYCOL 3350 17 G PO PACK: 17.0000 g | PACK | Freq: Every day | ORAL | 0 refills | Status: DC

## 2017-10-06 MED ORDER — IOPAMIDOL (ISOVUE-300) INJECTION 61%
INTRAVENOUS | Status: AC
  Filled 2017-10-06: qty 100

## 2017-10-06 MED ORDER — IOPAMIDOL (ISOVUE-300) INJECTION 61%
100.0000 mL | Freq: Once | INTRAVENOUS | Status: AC | PRN
  Administered 2017-10-06: 100 mL via INTRAVENOUS

## 2017-10-06 NOTE — Discharge Instructions (Addendum)
Take the MiraLAX on a daily basis.  Call gastroenterology for follow-up for the constipation and for the history of the rectal bleeding.  Return for any worse bleeding or blood in your bowel movements.

## 2017-10-06 NOTE — ED Notes (Signed)
Bed: BJ95 Expected date: 10/06/17 Expected time: 3:26 PM Means of arrival: Ambulance Comments: Abd pain, constipation

## 2017-10-06 NOTE — ED Triage Notes (Signed)
Patient arrives via EMS with c/o constipation that started Monday. No BM x 2 days. Patient states dark and bright red blood when attempting to use bathroom and pain in LLQ. Denies dysuria.

## 2017-10-06 NOTE — ED Provider Notes (Signed)
[facility] DEPT Provider Note   CSN: [id number] Arrival date & time: 10/06/17  1534     History   Chief Complaint Chief Complaint  Patient presents with  . Abdominal Pain  . Constipation  . Rectal Bleeding    HPI Brian Silva is a 77 y.o. male.  Patient brought in by EMS.  Patient with complaint of constipation since Monday.  Has no bowel movements for the past 2 days.  Patient states that they were dark and he had some bright red blood when attempting use the bathroom on Wednesday.  But none since.  Has had some left lower quadrant abdominal pain consistently.  No dysuria.  Patient does have a primary care doctor.  Patient is not followed by gastroenterology.     Past Medical History:  Diagnosis Date  . Asthma   . Chronic respiratory failure (HCC)    on 2L oxygen  . Hyperlipidemia   . Hypertension   . Obese   . OSA (obstructive sleep apnea)    Sleep study 08/2010 showed mild to moderate obstructive sleep apnea/hypopnea syndrome. However, never initiated CPAP  . Venous stasis    Venous Statis Disease (03/28/2004)    Patient Active Problem List   Diagnosis Date Noted  . CAP (community acquired pneumonia) 10/25/2015  . Community acquired pneumonia 10/25/2015  . Depression 10/25/2015  . Hyperlipidemia 10/25/2015  . Pulmonary edema   . TINEA PEDIS 01/04/2010  . TINEA VERSICOLOR 01/04/2010  . URI 01/04/2010  . HYPERGLYCEMIA 12/02/2008  . ABDOMINAL WALL HERNIA 12/01/2007  . OBESITY, MORBID 04/04/2007  . SLEEP APNEA, OBSTRUCTIVE 04/04/2007  . LOW BACK PAIN, CHRONIC 12/07/2004  . HYPERLIPIDEMIA 08/12/2002  . ALLERGIC RHINITIS, SEASONAL 08/12/2002  . Essential hypertension 04/01/2000  . Asthma 10/11/1997  . SUPERFICIAL PHLEBITIS 05/06/1990    No past surgical history on file.      Home Medications    Prior to Admission medications   Medication Sig Start Date End Date Taking? Authorizing Provider  amLODipine (NORVASC) 10  MG tablet TK 1 T PO QD 09/03/17  Yes [provider]  atorvastatin (LIPITOR) 20 MG tablet Take 20 mg by mouth at bedtime.     Yes [provider]  furosemide (LASIX) 80 MG tablet Take 80 mg by mouth 2 (two) times daily.    Yes [provider]  lisinopril (PRINIVIL,ZESTRIL) 40 MG tablet Take 40 mg by mouth daily.   Yes [provider]  meloxicam (MOBIC) 7.5 MG tablet TK 1 T PO ONCE D 09/28/17  Yes [provider]  metFORMIN (GLUCOPHAGE) 500 MG tablet TK 1 T PO BID 07/14/17  Yes [provider]  Sennosides (SENOKOT PO) Take 1 tablet by mouth every 6 (six) hours as needed (constipation).   Yes [provider]  albuterol (PROVENTIL HFA;VENTOLIN HFA) 108 (90 Base) MCG/ACT inhaler Inhale 2 puffs into the lungs every 6 (six) hours as needed. For shortness of breath 10/28/15   Theodis Blaze, MD  albuterol (PROVENTIL) (2.5 MG/3ML) 0.083% nebulizer solution U 3 ML VIA NEB Q 6 TO 8 H PRN FOR SOB AND WHEEZING 08/16/17   [provider]  benzonatate (TESSALON) 200 MG capsule Take 1 capsule (200 mg total) by mouth 3 (three) times daily as needed for cough. Patient not taking: Reported on 10/06/2017 10/28/15   Theodis Blaze, MD  guaiFENesin (MUCINEX) 600 MG 12 hr tablet Take 1 tablet (600 mg total) by mouth 2 (two) times daily. Patient not taking:  Reported on 10/06/2017 10/28/15   Theodis Blaze, MD  ipratropium-albuterol (DUONEB) 0.5-2.5 (3) MG/3ML SOLN Take 3 mLs by nebulization every 2 (two) hours as needed. Patient not taking: Reported on 10/06/2017 10/28/15   Theodis Blaze, MD  levofloxacin (LEVAQUIN) 500 MG tablet Take 1 tablet (500 mg total) by mouth daily. Patient not taking: Reported on 10/06/2017 10/28/15   Theodis Blaze, MD  losartan (COZAAR) 50 MG tablet Take 1 tablet (50 mg total) by mouth daily. Patient not taking: Reported on 10/06/2017 10/28/15   Theodis Blaze, MD  polyethylene glycol [facility]) packet Take 17 g by mouth daily. 10/06/17    Fredia Sorrow, MD  predniSONE (DELTASONE) 10 MG tablet Take 40 mg tablet on 10/29/2015 and taper down by 10 mg daily until completed Patient not taking: Reported on 10/06/2017 10/28/15   Theodis Blaze, MD    Family History No family history on file.  Social History Social History   Tobacco Use  . Smoking status: Never Smoker  Substance Use Topics  . Alcohol use: Yes    Comment: occasionally   . Drug use: Not on file     Allergies   Patient has no known allergies.   Review of Systems Review of Systems  Constitutional: Negative for fever.  HENT: Negative for congestion.   Eyes: Negative for redness.  Respiratory: Negative for shortness of breath.   Cardiovascular: Negative for chest pain.  Gastrointestinal: Positive for abdominal pain, blood in stool and constipation. Negative for diarrhea, nausea and vomiting.  Genitourinary: Negative for dysuria.  Neurological: Negative for syncope.  Hematological: Does not bruise/bleed easily.     Physical Exam Updated Vital Signs BP 138/76   Pulse 71   Temp 98.3 F (36.8 C) (Oral)   Resp 17   Ht 1.803 m (5\' 11" )   Wt (!) 178.7 kg (394 lb)   SpO2 95%   BMI 54.95 kg/m   Physical Exam  Constitutional: He is oriented to person, place, and time. He appears well-developed and well-nourished. No distress.  HENT:  Head: Normocephalic and atraumatic.  Mouth/Throat: Oropharynx is clear and moist.  Eyes: Pupils are equal, round, and reactive to light. Conjunctivae and EOM are normal.  Neck: Neck supple.  Cardiovascular: Normal rate, regular rhythm and normal heart sounds.  Pulmonary/Chest: Effort normal and breath sounds normal. No respiratory distress.  Abdominal: Soft. Bowel sounds are normal. There is no tenderness.  Patient very obese.  Genitourinary: Rectal exam shows guaiac negative stool.  Musculoskeletal: Normal range of motion.  Neurological: He is alert and oriented to person, place, and time. No cranial nerve deficit  or sensory deficit. He exhibits normal muscle tone. Coordination normal.  Skin: Skin is warm.  Nursing note and vitals reviewed.    ED Treatments / Results  Labs (all labs ordered are listed, but only abnormal results are displayed) Labs Reviewed  COMPREHENSIVE METABOLIC PANEL - Abnormal; Notable for the following components:      Result Value   Glucose, Bld 116 (*)    All other components within normal limits  CBC WITH DIFFERENTIAL/PLATELET - Abnormal; Notable for the following components:   Hemoglobin 12.8 (*)    HCT 38.8 (*)    All other components within normal limits  HEMOGLOBIN AND HEMATOCRIT, BLOOD - Abnormal; Notable for the following components:   Hemoglobin 12.0 (*)    HCT 36.4 (*)    All other components within normal limits  LIPASE, BLOOD  OCCULT BLOOD X 1 CARD TO  LAB, STOOL    EKG None  Radiology Ct Abdomen Pelvis W Contrast  Result Date: 10/06/2017 CLINICAL DATA:  Constipation starting Monday with no bowel movement x2 days. Dark and bright red blood when attempting to use bathroom. Left lower quadrant pain. EXAM: CT ABDOMEN AND PELVIS WITH CONTRAST TECHNIQUE: Multidetector CT imaging of the abdomen and pelvis was performed using the standard protocol following bolus administration of intravenous contrast. CONTRAST:  137mL ISOVUE-300 IOPAMIDOL (ISOVUE-300) INJECTION 61% COMPARISON:  03/31/2012 CT FINDINGS: Lower chest: Heart size is top normal without pericardial effusion. Atherosclerosis of the thoracic aorta near the aortic root. Lung bases are clear without active pulmonary disease. Hepatobiliary: Homogeneous attenuation of the liver. Unremarkable gallbladder. No biliary dilatation. Pancreas: Normal Spleen: Scattered calcifications compatible splenic granulomas. No splenomegaly or enhancing mass. Adrenals/Urinary Tract: Normal bilateral adrenal glands and kidneys without nephrolithiasis nor obstructive uropathy. There is mild nonspecific perinephric fat stranding of  both kidneys similar to prior. No hydroureteronephrosis. Urinary bladder is unremarkable. Stomach/Bowel: Decompressed stomach. Small hiatal hernia. Normal small bowel rotation without inflammation or obstruction. Faint suture material is suggested along the distal third portion of the duodenum without complicating features. Normal appendix, distal and terminal ileum. Moderate fecal retention within the distal transverse colon through rectum without bowel obstruction or inflammation. Colonic diverticulosis is noted along the distal descending proximal sigmoid colon without acute diverticulitis. Vascular/Lymphatic: Moderate atherosclerosis of the nonaneurysmal abdominal aorta and branch vessels. No adenopathy by CT size criteria. Reproductive: Normal size prostate and seminal vesicles. Other: Periumbilical fat containing hernia is stable in appearance. Musculoskeletal: Degenerative changes are noted of the dorsal spine with marked disc space narrowing at T12-L1, L3-4 and moderate at L4-5 and L5-S1. IMPRESSION: 1. Increased colonic stool burden with distal descending and sigmoid diverticulosis but without diverticulitis. No bowel obstruction or inflammation. 2. Similar appearing periumbilical fat containing hernia without incarceration. Electronically Signed   By: Ashley Royalty M.D.   On: 10/06/2017 18:29    Procedures Procedures (including critical care time)  Medications Ordered in ED Medications  iopamidol (ISOVUE-300) 61 % injection (has no administration in time range)  iopamidol (ISOVUE-300) 61 % injection 100 mL (100 mLs Intravenous Contrast Given 10/06/17 1808)     Initial Impression / Assessment and Plan / ED Course  I have reviewed the triage vital signs and the nursing notes.  Pertinent labs & imaging results that were available during my care of the patient were reviewed by me and considered in my medical decision making (see chart for details).    Patient with a history of some black stool  and some red blood earlier in the week.  Hemoglobin hematocrit here stable CT scan of abdomen without any acute findings other than constipation.  Hemoccult negative here.  No evidence of any gross blood.  Stool was dark.  Patient was treated MiraLAX patient given referral to gastroenterology for further follow-up.  Patient will return for any recurrent red blood per rectum or any vomiting of blood.  Or any new or worse symptoms.  Patient stable for discharge home.   Final Clinical Impressions(s) / ED Diagnoses   Final diagnoses:  None    ED Discharge Orders        Ordered    polyethylene glycol (MIRALAX) packet  Daily     10/06/17 2235       Fredia Sorrow, MD 10/07/17 [phone]

## 2017-11-27 DIAGNOSIS — L03032 Cellulitis of left toe: Secondary | ICD-10-CM | POA: Diagnosis not present

## 2017-11-27 DIAGNOSIS — M79675 Pain in left toe(s): Secondary | ICD-10-CM | POA: Diagnosis not present

## 2017-11-27 DIAGNOSIS — B351 Tinea unguium: Secondary | ICD-10-CM | POA: Diagnosis not present

## 2017-11-28 DIAGNOSIS — E119 Type 2 diabetes mellitus without complications: Secondary | ICD-10-CM | POA: Diagnosis not present

## 2017-11-28 DIAGNOSIS — E785 Hyperlipidemia, unspecified: Secondary | ICD-10-CM | POA: Diagnosis not present

## 2017-11-28 DIAGNOSIS — J45909 Unspecified asthma, uncomplicated: Secondary | ICD-10-CM | POA: Diagnosis not present

## 2017-11-28 DIAGNOSIS — M19041 Primary osteoarthritis, right hand: Secondary | ICD-10-CM | POA: Diagnosis not present

## 2017-11-28 DIAGNOSIS — I10 Essential (primary) hypertension: Secondary | ICD-10-CM | POA: Diagnosis not present

## 2017-11-28 DIAGNOSIS — F329 Major depressive disorder, single episode, unspecified: Secondary | ICD-10-CM | POA: Diagnosis not present

## 2017-11-28 DIAGNOSIS — I509 Heart failure, unspecified: Secondary | ICD-10-CM | POA: Diagnosis not present

## 2017-11-28 DIAGNOSIS — E559 Vitamin D deficiency, unspecified: Secondary | ICD-10-CM | POA: Diagnosis not present

## 2017-12-06 DIAGNOSIS — I10 Essential (primary) hypertension: Secondary | ICD-10-CM | POA: Diagnosis not present

## 2017-12-06 DIAGNOSIS — E119 Type 2 diabetes mellitus without complications: Secondary | ICD-10-CM | POA: Diagnosis not present

## 2017-12-06 DIAGNOSIS — R0609 Other forms of dyspnea: Secondary | ICD-10-CM | POA: Diagnosis not present

## 2017-12-13 DIAGNOSIS — L03032 Cellulitis of left toe: Secondary | ICD-10-CM | POA: Diagnosis not present

## 2017-12-26 DIAGNOSIS — M79671 Pain in right foot: Secondary | ICD-10-CM | POA: Diagnosis not present

## 2017-12-26 DIAGNOSIS — B351 Tinea unguium: Secondary | ICD-10-CM | POA: Diagnosis not present

## 2017-12-26 DIAGNOSIS — M79672 Pain in left foot: Secondary | ICD-10-CM | POA: Diagnosis not present

## 2018-01-16 ENCOUNTER — Emergency Department (HOSPITAL_COMMUNITY): Payer: Medicare Other

## 2018-01-16 ENCOUNTER — Encounter (HOSPITAL_COMMUNITY): Payer: Self-pay | Admitting: Emergency Medicine

## 2018-01-16 ENCOUNTER — Observation Stay (HOSPITAL_COMMUNITY)
Admission: EM | Admit: 2018-01-16 | Discharge: 2018-01-18 | Disposition: A | Discharge status: Home / Self Care | Payer: Medicare Other | Attending: Internal Medicine | Admitting: Internal Medicine

## 2018-01-16 ENCOUNTER — Other Ambulatory Visit: Payer: Self-pay

## 2018-01-16 DIAGNOSIS — E871 Hypo-osmolality and hyponatremia: Secondary | ICD-10-CM | POA: Insufficient documentation

## 2018-01-16 DIAGNOSIS — Z23 Encounter for immunization: Secondary | ICD-10-CM | POA: Insufficient documentation

## 2018-01-16 DIAGNOSIS — G4733 Obstructive sleep apnea (adult) (pediatric): Secondary | ICD-10-CM | POA: Diagnosis not present

## 2018-01-16 DIAGNOSIS — I130 Hypertensive heart and chronic kidney disease with heart failure and stage 1 through stage 4 chronic kidney disease, or unspecified chronic kidney disease: Secondary | ICD-10-CM | POA: Insufficient documentation

## 2018-01-16 DIAGNOSIS — Z79899 Other long term (current) drug therapy: Secondary | ICD-10-CM | POA: Insufficient documentation

## 2018-01-16 DIAGNOSIS — I10 Essential (primary) hypertension: Secondary | ICD-10-CM | POA: Diagnosis present

## 2018-01-16 DIAGNOSIS — J45909 Unspecified asthma, uncomplicated: Secondary | ICD-10-CM | POA: Diagnosis not present

## 2018-01-16 DIAGNOSIS — N179 Acute kidney failure, unspecified: Secondary | ICD-10-CM | POA: Insufficient documentation

## 2018-01-16 DIAGNOSIS — R55 Syncope and collapse: Principal | ICD-10-CM | POA: Insufficient documentation

## 2018-01-16 DIAGNOSIS — Z7984 Long term (current) use of oral hypoglycemic drugs: Secondary | ICD-10-CM | POA: Insufficient documentation

## 2018-01-16 DIAGNOSIS — N189 Chronic kidney disease, unspecified: Secondary | ICD-10-CM | POA: Diagnosis not present

## 2018-01-16 DIAGNOSIS — E119 Type 2 diabetes mellitus without complications: Secondary | ICD-10-CM | POA: Insufficient documentation

## 2018-01-16 DIAGNOSIS — I5032 Chronic diastolic (congestive) heart failure: Secondary | ICD-10-CM | POA: Diagnosis not present

## 2018-01-16 DIAGNOSIS — K5900 Constipation, unspecified: Secondary | ICD-10-CM | POA: Diagnosis not present

## 2018-01-16 DIAGNOSIS — W19XXXA Unspecified fall, initial encounter: Secondary | ICD-10-CM | POA: Diagnosis not present

## 2018-01-16 DIAGNOSIS — E1169 Type 2 diabetes mellitus with other specified complication: Secondary | ICD-10-CM

## 2018-01-16 LAB — BASIC METABOLIC PANEL
Anion gap: 14 (ref 5–15)
BUN: 34 mg/dL — ABNORMAL HIGH (ref 8–23)
CO2: 23 mmol/L (ref 22–32)
Calcium: 9 mg/dL (ref 8.9–10.3)
Chloride: 88 mmol/L — ABNORMAL LOW (ref 98–111)
Creatinine, Ser: 1.64 mg/dL — ABNORMAL HIGH (ref 0.61–1.24)
GFR calc Af Amer: 46 mL/min — ABNORMAL LOW (ref >60)
GFR calc non Af Amer: 39 mL/min — ABNORMAL LOW (ref >60)
Glucose, Bld: 158 mg/dL — ABNORMAL HIGH (ref 70–99)
Potassium: 5.1 mmol/L (ref 3.5–5.1)
Sodium: 125 mmol/L — ABNORMAL LOW (ref 135–145)

## 2018-01-16 LAB — CBC
HCT: 37.1 % — ABNORMAL LOW (ref 39.0–52.0)
Hemoglobin: 12.2 g/dL — ABNORMAL LOW (ref 13.0–17.0)
MCH: 28.5 pg (ref 26.0–34.0)
MCHC: 32.9 g/dL (ref 30.0–36.0)
MCV: 86.7 fL (ref 78.0–100.0)
Platelets: 398 10*3/uL (ref 150–400)
RBC: 4.28 MIL/uL (ref 4.22–5.81)
RDW: 13.4 % (ref 11.5–15.5)
WBC: 13.3 10*3/uL — ABNORMAL HIGH (ref 4.0–10.5)

## 2018-01-16 LAB — CBG MONITORING, ED: Glucose-Capillary: 161 mg/dL — ABNORMAL HIGH (ref 70–99)

## 2018-01-16 MED ORDER — SODIUM CHLORIDE 0.9 % IV BOLUS
500.0000 mL | Freq: Once | INTRAVENOUS | Status: AC
  Administered 2018-01-16: 500 mL via INTRAVENOUS

## 2018-01-16 MED ORDER — ALBUTEROL SULFATE HFA 108 (90 BASE) MCG/ACT IN AERS
2.0000 | INHALATION_SPRAY | Freq: Once | RESPIRATORY_TRACT | Status: AC
  Administered 2018-01-16: 2 via RESPIRATORY_TRACT
  Filled 2018-01-16: qty 6.7

## 2018-01-16 NOTE — ED Triage Notes (Signed)
Per EMS, pt coming from home with complaints of a syncopal episode after patient was staining to have a bowel movement. Pt stated he got lightheaded and fell off the toilet. Pt has no visible injuries from the fall and complains of no pain. Pt is not on thinners. VSS.

## 2018-01-16 NOTE — ED Provider Notes (Signed)
[facility] EMERGENCY DEPARTMENT Provider Note   CSN: [id number] Arrival date & time: 01/16/18  2243     History   Chief Complaint Chief Complaint  Patient presents with  . Loss of Consciousness    HPI Brian Silva is a 77 y.o. male.  The history is provided by the patient and medical records.    77 y.o. M with hx of asthma, HLP, HTN, obesity, OSA, venous stasis disase, depression, HLP, presenting to the ED for syncopal event.  Patient states he was on the toilet straining to have a bowel movement and started to feel lightheaded, a little sweaty, and fell off the toilet.  States as soon as he hit the floor he woke back up.  Patient denies injuries from the falls.  States his last BM was a few days ago, states usually he goes twice a day.  He has been eating and drinking fine, actually ate a pizza prior to this episode.  No fever.  No nausea/vomiting.  No diarrhea.  States he has had issues with constipation before-- usually will feel some mild pain on his left side when he needs to have a BM.  No prior abdominal surgeries.  Patient does report syncopal episode last week as well that was not associated with defecation.  No prior work-up for syncope.  Past Medical History:  Diagnosis Date  . Asthma   . Chronic respiratory failure (HCC)    on 2L oxygen  . Hyperlipidemia   . Hypertension   . Obese   . OSA (obstructive sleep apnea)    Sleep study 08/2010 showed mild to moderate obstructive sleep apnea/hypopnea syndrome. However, never initiated CPAP  . Venous stasis    Venous Statis Disease (03/28/2004)    Patient Active Problem List   Diagnosis Date Noted  . CAP (community acquired pneumonia) 10/25/2015  . Community acquired pneumonia 10/25/2015  . Depression 10/25/2015  . Hyperlipidemia 10/25/2015  . Pulmonary edema   . TINEA PEDIS 01/04/2010  . TINEA VERSICOLOR 01/04/2010  . URI 01/04/2010  . HYPERGLYCEMIA 12/02/2008  . ABDOMINAL WALL HERNIA  12/01/2007  . OBESITY, MORBID 04/04/2007  . SLEEP APNEA, OBSTRUCTIVE 04/04/2007  . LOW BACK PAIN, CHRONIC 12/07/2004  . HYPERLIPIDEMIA 08/12/2002  . ALLERGIC RHINITIS, SEASONAL 08/12/2002  . Essential hypertension 04/01/2000  . Asthma 10/11/1997  . SUPERFICIAL PHLEBITIS 05/06/1990    History reviewed. No pertinent surgical history.      Home Medications    Prior to Admission medications   Medication Sig Start Date End Date Taking? Authorizing Provider  albuterol (PROVENTIL HFA;VENTOLIN HFA) 108 (90 Base) MCG/ACT inhaler Inhale 2 puffs into the lungs every 6 (six) hours as needed. For shortness of breath 10/28/15   Theodis Blaze, MD  albuterol (PROVENTIL) (2.5 MG/3ML) 0.083% nebulizer solution U 3 ML VIA NEB Q 6 TO 8 H PRN FOR SOB AND WHEEZING 08/16/17   [provider]  amLODipine (NORVASC) 10 MG tablet TK 1 T PO QD 09/03/17   [provider]  atorvastatin (LIPITOR) 20 MG tablet Take 20 mg by mouth at bedtime.      [provider]  benzonatate (TESSALON) 200 MG capsule Take 1 capsule (200 mg total) by mouth 3 (three) times daily as needed for cough. Patient not taking: Reported on 10/06/2017 10/28/15   Theodis Blaze, MD  furosemide (LASIX) 80 MG tablet Take 80 mg by mouth 2 (two) times daily.     [provider]  guaiFENesin ([place]) 600  MG 12 hr tablet Take 1 tablet (600 mg total) by mouth 2 (two) times daily. Patient not taking: Reported on 10/06/2017 10/28/15   Theodis Blaze, MD  ipratropium-albuterol (DUONEB) 0.5-2.5 (3) MG/3ML SOLN Take 3 mLs by nebulization every 2 (two) hours as needed. Patient not taking: Reported on 10/06/2017 10/28/15   Theodis Blaze, MD  levofloxacin (LEVAQUIN) 500 MG tablet Take 1 tablet (500 mg total) by mouth daily. Patient not taking: Reported on 10/06/2017 10/28/15   Theodis Blaze, MD  lisinopril (PRINIVIL,ZESTRIL) 40 MG tablet Take 40 mg by mouth daily.    [provider]  losartan (COZAAR) 50 MG tablet Take 1  tablet (50 mg total) by mouth daily. Patient not taking: Reported on 10/06/2017 10/28/15   Theodis Blaze, MD  meloxicam (MOBIC) 7.5 MG tablet TK 1 T PO ONCE D 09/28/17   [provider]  metFORMIN (GLUCOPHAGE) 500 MG tablet TK 1 T PO BID 07/14/17   [provider]  polyethylene glycol (MIRALAX) packet Take 17 g by mouth daily. 10/06/17   Fredia Sorrow, MD  predniSONE (DELTASONE) 10 MG tablet Take 40 mg tablet on 10/29/2015 and taper down by 10 mg daily until completed Patient not taking: Reported on 10/06/2017 10/28/15   Theodis Blaze, MD  Sennosides (SENOKOT PO) Take 1 tablet by mouth every 6 (six) hours as needed (constipation).    [provider]    Family History History reviewed. No pertinent family history.  Social History Social History   Tobacco Use  . Smoking status: Never Smoker  Substance Use Topics  . Alcohol use: Yes    Comment: occasionally   . Drug use: Not on file     Allergies   Patient has no known allergies.   Review of Systems Review of Systems  Neurological: Positive for syncope.  All other systems reviewed and are negative.    Physical Exam Updated Vital Signs BP 113/81   Pulse 80   Temp 97.9 F (36.6 C) (Oral)   Resp 13   Ht 5\' 10"  (1.778 m)   Wt (!) 169.6 kg   SpO2 96%   BMI 53.66 kg/m   Physical Exam  Constitutional: He is oriented to person, place, and time. Vital signs are normal. He appears well-developed and well-nourished.  Obese, elderly  HENT:  Head: Normocephalic and atraumatic.  Mouth/Throat: Oropharynx is clear and moist.  Eyes: Pupils are equal, round, and reactive to light. Conjunctivae and EOM are normal.  Neck: Normal range of motion.  Cardiovascular: Normal rate, regular rhythm and normal heart sounds.  Pulmonary/Chest: Effort normal and breath sounds normal.  Abdominal: Soft. Bowel sounds are normal. There is no tenderness. There is no rigidity and no guarding.  Abdomen is obese but soft and  non-tender  Musculoskeletal: Normal range of motion.  Neurological: He is alert and oriented to person, place, and time.  AAOx3, answering questions and following commands appropriately; equal strength UE and LE bilaterally; CN grossly intact; moves all extremities appropriately without ataxia; no focal neuro deficits or facial asymmetry appreciated  Skin: Skin is warm and dry.  Psychiatric: He has a normal mood and affect.  Nursing note and vitals reviewed.    ED Treatments / Results  Labs (all labs ordered are listed, but only abnormal results are displayed) Labs Reviewed  BASIC METABOLIC PANEL - Abnormal; Notable for the following components:      Result Value   Sodium 125 (*)    Chloride 88 (*)  Glucose, Bld 158 (*)    BUN 34 (*)    Creatinine, Ser 1.64 (*)    GFR calc non Af Amer 39 (*)    GFR calc Af Amer 46 (*)    All other components within normal limits  CBC - Abnormal; Notable for the following components:   WBC 13.3 (*)    Hemoglobin 12.2 (*)    HCT 37.1 (*)    All other components within normal limits  GLUCOSE, CAPILLARY - Abnormal; Notable for the following components:   Glucose-Capillary 163 (*)    All other components within normal limits  CBG MONITORING, ED - Abnormal; Notable for the following components:   Glucose-Capillary 161 (*)    All other components within normal limits  URINALYSIS, ROUTINE W REFLEX MICROSCOPIC  TROPONIN I  BASIC METABOLIC PANEL  CREATININE, URINE, RANDOM  UREA NITROGEN, URINE  SODIUM, URINE, RANDOM    EKG EKG Interpretation  Date/Time:  Thursday January 16 2018 22:51:56 EDT Ventricular Rate:  79 PR Interval:    QRS Duration: 90 QT Interval:  345 QTC Calculation: 396 R Axis:   -24 Text Interpretation:  Sinus rhythm Borderline left axis deviation Low voltage, precordial leads No significant change since last tracing other than rate is slower Confirmed by Pryor Curia [phone]) on 01/16/2018 11:05:02 PM   Radiology Dg  Abd Acute W/chest  Result Date: 01/17/2018 CLINICAL DATA:  77 year old male with constipation. EXAM: DG ABDOMEN ACUTE W/ 1V CHEST COMPARISON:  Abdominal CT dated 10/06/2017 FINDINGS: The lungs are clear. There is no pleural effusion or pneumothorax. The cardiac silhouette is within normal limits. Atherosclerotic calcification of the aortic arch. No bowel dilatation or evidence of obstruction. No free air. Vascular calcification. The osseous structures and soft tissues are grossly unremarkable. IMPRESSION: 1. No acute cardiopulmonary process. 2. No evidence of bowel obstruction. Electronically Signed   By: Anner Crete M.D.   On: 01/17/2018 00:15    Procedures Procedures (including critical care time)  Medications Ordered in ED Medications  amLODipine (NORVASC) tablet 10 mg (has no administration in time range)  atorvastatin (LIPITOR) tablet 20 mg (has no administration in time range)  albuterol (PROVENTIL) (2.5 MG/3ML) 0.083% nebulizer solution 2.5 mg (has no administration in time range)  sodium chloride flush (NS) 0.9 % injection 3 mL (3 mLs Intravenous Given 01/17/18 0254)  insulin aspart (novoLOG) injection 0-9 Units (has no administration in time range)  insulin aspart (novoLOG) injection 0-5 Units (0 Units Subcutaneous Not Given 01/17/18 0248)  heparin injection 5,000 Units (has no administration in time range)  0.9 %  sodium chloride infusion ( Intravenous New Bag/Given 01/17/18 0252)  acetaminophen (TYLENOL) tablet 650 mg (has no administration in time range)    Or  acetaminophen (TYLENOL) suppository 650 mg (has no administration in time range)  senna-docusate (Senokot-S) tablet 1 tablet (has no administration in time range)  bisacodyl (DULCOLAX) EC tablet 5 mg (has no administration in time range)  magnesium citrate solution 1 Bottle (has no administration in time range)  ondansetron (ZOFRAN) tablet 4 mg (has no administration in time range)    Or  ondansetron (ZOFRAN) injection  4 mg (has no administration in time range)  albuterol (PROVENTIL HFA;VENTOLIN HFA) 108 (90 Base) MCG/ACT inhaler 2 puff (2 puffs Inhalation Given 01/16/18 2351)  sodium chloride 0.9 % bolus 500 mL (0 mLs Intravenous Stopped 01/17/18 0006)     Initial Impression / Assessment and Plan / ED Course  I have reviewed the triage vital signs and  the nursing notes.  Pertinent labs & imaging results that were available during my care of the patient were reviewed by me and considered in my medical decision making (see chart for details).  77 year old male here after syncopal event.  Second 1 this week, episode today occurred while trying to have a bowel movement.   He is awake, alert, appropriately oriented.  He has no focal neurologic deficits.  Denies any pain currently.  States it has been a few days since his last bowel movement which is somewhat abnormal but has not had any vomiting and has continued passing flatus.  EKG nonischemic.  Labs with evidence of mild AKI and hyponatremia.  He is on Lasix which may be contributing.  He was given gentle IV fluid bolus.  Acute abdominal series without acute findings to suggest obstruction or perforation.  No significant stool burden.  Episode today sounds vasovagal in origin as occurred while trying to defecate, however given this is his second episode this week will admit for syncope work-up as well as AKI.  Discussed with Dr. Myna Hidalgo-- will admit for ongoing care.  Final Clinical Impressions(s) / ED Diagnoses   Final diagnoses:  Vasovagal syncope  AKI (acute kidney injury) [facility])  Hyponatremia    ED Discharge Orders    None       Larene Pickett, PA-C 01/17/18 [place], Delice Bison, DO 01/17/18 [phone]

## 2018-01-17 ENCOUNTER — Encounter (HOSPITAL_COMMUNITY): Payer: Self-pay | Admitting: Family Medicine

## 2018-01-17 ENCOUNTER — Observation Stay (HOSPITAL_BASED_OUTPATIENT_CLINIC_OR_DEPARTMENT_OTHER): Payer: Medicare Other

## 2018-01-17 DIAGNOSIS — I10 Essential (primary) hypertension: Secondary | ICD-10-CM | POA: Diagnosis not present

## 2018-01-17 DIAGNOSIS — N179 Acute kidney failure, unspecified: Secondary | ICD-10-CM | POA: Diagnosis not present

## 2018-01-17 DIAGNOSIS — R55 Syncope and collapse: Secondary | ICD-10-CM | POA: Diagnosis present

## 2018-01-17 DIAGNOSIS — G4733 Obstructive sleep apnea (adult) (pediatric): Secondary | ICD-10-CM

## 2018-01-17 DIAGNOSIS — J4521 Mild intermittent asthma with (acute) exacerbation: Secondary | ICD-10-CM

## 2018-01-17 DIAGNOSIS — E1169 Type 2 diabetes mellitus with other specified complication: Secondary | ICD-10-CM

## 2018-01-17 DIAGNOSIS — I5030 Unspecified diastolic (congestive) heart failure: Secondary | ICD-10-CM

## 2018-01-17 DIAGNOSIS — E119 Type 2 diabetes mellitus without complications: Secondary | ICD-10-CM

## 2018-01-17 DIAGNOSIS — E871 Hypo-osmolality and hyponatremia: Secondary | ICD-10-CM | POA: Diagnosis not present

## 2018-01-17 DIAGNOSIS — N189 Chronic kidney disease, unspecified: Secondary | ICD-10-CM | POA: Diagnosis present

## 2018-01-17 LAB — URINALYSIS, ROUTINE W REFLEX MICROSCOPIC
Bilirubin Urine: NEGATIVE
Glucose, UA: NEGATIVE mg/dL
Hgb urine dipstick: NEGATIVE
Ketones, ur: NEGATIVE mg/dL
Leukocytes, UA: NEGATIVE
Nitrite: NEGATIVE
Protein, ur: NEGATIVE mg/dL
Specific Gravity, Urine: 1.006 (ref 1.005–1.030)
pH: 5 (ref 5.0–8.0)

## 2018-01-17 LAB — SODIUM, URINE, RANDOM: Sodium, Ur: 26 mmol/L

## 2018-01-17 LAB — OSMOLALITY, URINE: Osmolality, Ur: 152 mOsm/kg — ABNORMAL LOW (ref 300–900)

## 2018-01-17 LAB — BASIC METABOLIC PANEL
Anion gap: 12 (ref 5–15)
BUN: 36 mg/dL — ABNORMAL HIGH (ref 8–23)
CO2: 25 mmol/L (ref 22–32)
Calcium: 8.7 mg/dL — ABNORMAL LOW (ref 8.9–10.3)
Chloride: 91 mmol/L — ABNORMAL LOW (ref 98–111)
Creatinine, Ser: 1.66 mg/dL — ABNORMAL HIGH (ref 0.61–1.24)
GFR calc Af Amer: 45 mL/min — ABNORMAL LOW (ref >60)
GFR calc non Af Amer: 39 mL/min — ABNORMAL LOW (ref >60)
Glucose, Bld: 154 mg/dL — ABNORMAL HIGH (ref 70–99)
Potassium: 4.7 mmol/L (ref 3.5–5.1)
Sodium: 128 mmol/L — ABNORMAL LOW (ref 135–145)

## 2018-01-17 LAB — GLUCOSE, CAPILLARY
Glucose-Capillary: 163 mg/dL — ABNORMAL HIGH (ref 70–99)
Glucose-Capillary: 132 mg/dL — ABNORMAL HIGH (ref 70–99)
Glucose-Capillary: 132 mg/dL — ABNORMAL HIGH (ref 70–99)
Glucose-Capillary: 114 mg/dL — ABNORMAL HIGH (ref 70–99)
Glucose-Capillary: 105 mg/dL — ABNORMAL HIGH (ref 70–99)

## 2018-01-17 LAB — ECHOCARDIOGRAM COMPLETE
Height: 70 in
Weight: 6088 oz

## 2018-01-17 LAB — TROPONIN I: Troponin I: <0.03 ng/mL (ref <0.03)

## 2018-01-17 LAB — CREATININE, URINE, RANDOM: Creatinine, Urine: 17.98 mg/dL

## 2018-01-17 MED ORDER — SODIUM CHLORIDE 0.9% FLUSH
3.0000 mL | Freq: Two times a day (BID) | INTRAVENOUS | Status: DC
  Administered 2018-01-17: 3 mL via INTRAVENOUS

## 2018-01-17 MED ORDER — AMLODIPINE BESYLATE 5 MG PO TABS
10.0000 mg | ORAL_TABLET | Freq: Every day | ORAL | Status: DC
  Administered 2018-01-18: 10 mg via ORAL
  Filled 2018-01-17: qty 2

## 2018-01-17 MED ORDER — SODIUM CHLORIDE 0.9 % IV SOLN
INTRAVENOUS | Status: DC
  Administered 2018-01-17: 21:00:00 via INTRAVENOUS

## 2018-01-17 MED ORDER — HEPARIN SODIUM (PORCINE) 5000 UNIT/ML IJ SOLN
5000.0000 [IU] | Freq: Three times a day (TID) | INTRAMUSCULAR | Status: DC
  Administered 2018-01-17 – 2018-01-18 (×3): 5000 [IU] via SUBCUTANEOUS
  Filled 2018-01-17 (×3): qty 1

## 2018-01-17 MED ORDER — INSULIN ASPART 100 UNIT/ML ~~LOC~~ SOLN: 0.0000 [IU] | Freq: Three times a day (TID) | SUBCUTANEOUS | Status: DC

## 2018-01-17 MED ORDER — INSULIN ASPART 100 UNIT/ML ~~LOC~~ SOLN: 0.0000 [IU] | Freq: Every day | SUBCUTANEOUS | Status: DC

## 2018-01-17 MED ORDER — SODIUM CHLORIDE 0.9 % IV SOLN
INTRAVENOUS | Status: AC
  Administered 2018-01-17: 03:00:00 via INTRAVENOUS

## 2018-01-17 MED ORDER — ONDANSETRON HCL 4 MG PO TABS: 4.0000 mg | ORAL_TABLET | Freq: Four times a day (QID) | ORAL | Status: DC | PRN

## 2018-01-17 MED ORDER — INFLUENZA VAC SPLIT HIGH-DOSE 0.5 ML IM SUSY
0.5000 mL | PREFILLED_SYRINGE | INTRAMUSCULAR | Status: AC
  Administered 2018-01-18: 0.5 mL via INTRAMUSCULAR
  Filled 2018-01-17: qty 0.5

## 2018-01-17 MED ORDER — MAGNESIUM CITRATE PO SOLN: 1.0000 | Freq: Once | ORAL | Status: DC | PRN

## 2018-01-17 MED ORDER — ACETAMINOPHEN 650 MG RE SUPP: 650.0000 mg | Freq: Four times a day (QID) | RECTAL | Status: DC | PRN

## 2018-01-17 MED ORDER — BISACODYL 5 MG PO TBEC: 5.0000 mg | DELAYED_RELEASE_TABLET | Freq: Every day | ORAL | Status: DC | PRN

## 2018-01-17 MED ORDER — SENNOSIDES-DOCUSATE SODIUM 8.6-50 MG PO TABS: 1.0000 | ORAL_TABLET | Freq: Every evening | ORAL | Status: DC | PRN

## 2018-01-17 MED ORDER — ACETAMINOPHEN 325 MG PO TABS: 650.0000 mg | ORAL_TABLET | Freq: Four times a day (QID) | ORAL | Status: DC | PRN

## 2018-01-17 MED ORDER — ATORVASTATIN CALCIUM 20 MG PO TABS
20.0000 mg | ORAL_TABLET | Freq: Every day | ORAL | Status: DC
  Administered 2018-01-17: 20 mg via ORAL
  Filled 2018-01-17: qty 1

## 2018-01-17 MED ORDER — SODIUM CHLORIDE 0.9 % IV SOLN: INTRAVENOUS | Status: DC

## 2018-01-17 MED ORDER — PERFLUTREN LIPID MICROSPHERE
1.0000 mL | INTRAVENOUS | Status: AC | PRN
  Administered 2018-01-17: 4 mL via INTRAVENOUS
  Filled 2018-01-17: qty 10

## 2018-01-17 MED ORDER — ONDANSETRON HCL 4 MG/2ML IJ SOLN: 4.0000 mg | Freq: Four times a day (QID) | INTRAMUSCULAR | Status: DC | PRN

## 2018-01-17 MED ORDER — SENNOSIDES-DOCUSATE SODIUM 8.6-50 MG PO TABS
1.0000 | ORAL_TABLET | Freq: Two times a day (BID) | ORAL | Status: DC
  Administered 2018-01-17 – 2018-01-18 (×2): 1 via ORAL
  Filled 2018-01-17 (×2): qty 1

## 2018-01-17 MED ORDER — ALBUTEROL SULFATE (2.5 MG/3ML) 0.083% IN NEBU
2.5000 mg | INHALATION_SOLUTION | Freq: Four times a day (QID) | RESPIRATORY_TRACT | Status: DC | PRN
Start: 2018-01-17 — End: 2018-01-18

## 2018-01-17 NOTE — H&P (Signed)
History and Physical    Brian Silva BPZ:[id number] DOB: [birth year] DOA: 01/16/2018  PCP: Benito Mccreedy, MD   Patient coming from: Home   Chief Complaint: Syncope   HPI: Brian Silva is a 77 y.o. male with medical history significant for obesity (BMI 54), type 2 diabetes mellitus, asthma, hypertension, and chronic diastolic CHF, presenting to the emergency department for evaluation of a syncopal episode.  Patient reports that he had a syncopal episode a few days ago while seated on the couch at rest, watching TV, did not experience any chest pain or palpitations, and recovered within seconds.  He was sitting on the toilet just prior to arrival in the ED tonight, straining to move his bowels, when he developed acute lightheadedness and suffered a brief loss of consciousness.  He reports returning to his usual state rapidly.  There was no chest pain or palpitations associated with this and he denies any shortness of breath, leg swelling or tenderness, recent fevers or chills, nausea, vomiting, or diarrhea.  ED Course: Upon arrival to the ED, patient is found to be afebrile, saturating well on room air, and with vitals otherwise normal.  EKG features a sinus rhythm, chest x-ray is negative for acute cardiopulmonary disease, and KUB features a normal bowel gas pattern.  Chemistry panel is notable for a sodium of 125, BUN 34, and creatinine 1.64, up from 1.08 in June.  CBC is notable for leukocytosis to 13,300 and a stable mild normocytic anemia.  Troponin is undetectable.  Patient was given 500 cc normal saline in the ED, has remained hemodynamically stable, and will be observed for ongoing evaluation and management of syncope.  Review of Systems:  All other systems reviewed and apart from HPI, are negative.  Past Medical History:  Diagnosis Date  . Asthma   . Chronic respiratory failure (HCC)    on 2L oxygen  . Hyperlipidemia   . Hypertension   . Obese   . OSA (obstructive sleep  apnea)    Sleep study 08/2010 showed mild to moderate obstructive sleep apnea/hypopnea syndrome. However, never initiated CPAP  . Venous stasis    Venous Statis Disease (03/28/2004)    History reviewed. No pertinent surgical history.   reports that he has never smoked. He does not have any smokeless tobacco history on file. He reports that he drinks alcohol. His drug history is not on file.  No Known Allergies  History reviewed. No pertinent family history.   Prior to Admission medications   Medication Sig Start Date End Date Taking? Authorizing Provider  albuterol (PROVENTIL HFA;VENTOLIN HFA) 108 (90 Base) MCG/ACT inhaler Inhale 2 puffs into the lungs every 6 (six) hours as needed. For shortness of breath 10/28/15   Theodis Blaze, MD  albuterol (PROVENTIL) (2.5 MG/3ML) 0.083% nebulizer solution U 3 ML VIA NEB Q 6 TO 8 H PRN FOR SOB AND WHEEZING 08/16/17   [provider]  amLODipine (NORVASC) 10 MG tablet TK 1 T PO QD 09/03/17   [provider]  atorvastatin (LIPITOR) 20 MG tablet Take 20 mg by mouth at bedtime.      [provider]  furosemide (LASIX) 80 MG tablet Take 80 mg by mouth 2 (two) times daily.     [provider]  lisinopril (PRINIVIL,ZESTRIL) 40 MG tablet Take 40 mg by mouth daily.    [provider]  meloxicam (MOBIC) 7.5 MG tablet TK 1 T PO ONCE D 09/28/17   [provider]  metFORMIN (GLUCOPHAGE)  500 MG tablet TK 1 T PO BID 07/14/17   [provider]  polyethylene glycol (MIRALAX) packet Take 17 g by mouth daily. 10/06/17   Fredia Sorrow, MD  Sennosides (SENOKOT PO) Take 1 tablet by mouth every 6 (six) hours as needed (constipation).    [provider]    Physical Exam: Vitals:   01/16/18 2246 01/16/18 2248 01/16/18 2250 01/17/18 0000  BP: 113/81   (!) 148/51  Pulse:   80 77  Resp:   13 13  Temp:  97.9 F (36.6 C)    TempSrc:  Oral    SpO2:   96% 100%  Weight:  (!) 169.6 kg    Height:  5\' 10"   (1.778 m)       Constitutional: NAD, calm  Eyes: PERTLA, lids and conjunctivae normal ENMT: Mucous membranes are moist. Posterior pharynx clear of any exudate or lesions.   Neck: normal, supple, no masses, no thyromegaly Respiratory: clear to auscultation bilaterally, no wheezing, no crackles. Normal respiratory effort.    Cardiovascular: S1 & S2 heard, regular rate and rhythm. No extremity edema.   Abdomen: No distension, no tenderness, soft. Bowel sounds normal.  Musculoskeletal: no clubbing / cyanosis. No joint deformity upper and lower extremities.    Skin: no significant rashes, lesions, ulcers. Warm, dry, well-perfused. Neurologic: No facial asymmetry. Sensation intact. Strength 5/5 in all 4 limbs.  Psychiatric: Alert and oriented x 3. Calm, cooperative.    Labs on Admission: I have personally reviewed following labs and imaging studies  CBC: Recent Labs  Lab 01/16/18 2257  WBC 13.3*  HGB 12.2*  HCT 37.1*  MCV 86.7  PLT 628   Basic Metabolic Panel: Recent Labs  Lab 01/16/18 2257  NA 125*  K 5.1  CL 88*  CO2 23  GLUCOSE 158*  BUN 34*  CREATININE 1.64*  CALCIUM 9.0   GFR: Estimated Creatinine Clearance: 61.4 mL/min (A) (by C-G formula based on SCr of 1.64 mg/dL (H)). Liver Function Tests: No results for input(s): AST, ALT, ALKPHOS, BILITOT, PROT, ALBUMIN in the last 168 hours. No results for input(s): LIPASE, AMYLASE in the last 168 hours. No results for input(s): AMMONIA in the last 168 hours. Coagulation Profile: No results for input(s): INR, PROTIME in the last 168 hours. Cardiac Enzymes: Recent Labs  Lab 01/16/18 2257  TROPONINI <0.03   BNP (last 3 results) No results for input(s): PROBNP in the last 8760 hours. HbA1C: No results for input(s): HGBA1C in the last 72 hours. CBG: Recent Labs  Lab 01/16/18 2254  GLUCAP 161*   Lipid Profile: No results for input(s): CHOL, HDL, LDLCALC, TRIG, CHOLHDL, LDLDIRECT in the last 72 hours. Thyroid  Function Tests: No results for input(s): TSH, T4TOTAL, FREET4, T3FREE, THYROIDAB in the last 72 hours. Anemia Panel: No results for input(s): VITAMINB12, FOLATE, FERRITIN, TIBC, IRON, RETICCTPCT in the last 72 hours. Urine analysis:    Component Value Date/Time   COLORURINE YELLOW 10/25/2015 0058   APPEARANCEUR CLEAR 10/25/2015 0058   LABSPEC 1.010 10/25/2015 0058   PHURINE 5.5 10/25/2015 0058   GLUCOSEU NEGATIVE 10/25/2015 0058   HGBUR SMALL (A) 10/25/2015 0058   HGBUR negative 01/04/2010 1409   BILIRUBINUR NEGATIVE 10/25/2015 0058   KETONESUR NEGATIVE 10/25/2015 0058   PROTEINUR NEGATIVE 10/25/2015 0058   UROBILINOGEN 0.2 03/30/2012 2227   NITRITE NEGATIVE 10/25/2015 0058   LEUKOCYTESUR NEGATIVE 10/25/2015 0058   Sepsis Labs: @LABRCNTIP (procalcitonin:4,lacticidven:4) )No results found for this or any previous visit (from the past 240 hour(s)).  Radiological Exams on Admission: Dg Abd Acute W/chest  Result Date: 01/17/2018 CLINICAL DATA:  77 year old male with constipation. EXAM: DG ABDOMEN ACUTE W/ 1V CHEST COMPARISON:  Abdominal CT dated 10/06/2017 FINDINGS: The lungs are clear. There is no pleural effusion or pneumothorax. The cardiac silhouette is within normal limits. Atherosclerotic calcification of the aortic arch. No bowel dilatation or evidence of obstruction. No free air. Vascular calcification. The osseous structures and soft tissues are grossly unremarkable. IMPRESSION: 1. No acute cardiopulmonary process. 2. No evidence of bowel obstruction. Electronically Signed   By: Anner Crete M.D.   On: 01/17/2018 00:15    EKG: Independently reviewed. Sinus rhythm.   Assessment/Plan  1. Syncope  - Presents following a syncopal episode that occurred while straining to move bowels and is likely neurally-mediated/vasovagal, but also had an episode a few days earlier while seated and at rest  - Labs and exam suggestive hypovolemia, EKG unremarkable, no arryhthmia noted on  monitor in ED  - Check orthostatic vitals, continue cardiac monitoring, update echocardiogram   2. Acute kidney injury  - SCr is 1.64 on admission, up from 1.08 in June  - Likely prerenal given apparent hypovolemia  - Treated with 500 cc NS in ED  - Check urine chemistries, hold Lasix and lisinopril, continue a gentle IVF hydration with NS, and repeat chem panel in am    3. Hyponatremia  - Serum sodium is 125 on admission, previously normal  - Patient was felt to be hypovolemic and treated with 500 cc NS in ED  - Hold diuretics, continue a gentle IVF hydration with NS, repeat chem panel in am    4. Hypertension  - BP at goal  - Hold lisinopril until renal function stabilizes, continue Norvasc as tolerated    5. Chronic diastolic CHF  - Preserved EF on echo from 2017  - Hold Lasix while hydrating, hold ACE until renal function stabilizes - Updating echo as above for syncope eval  - Follow daily and I/O's    6. Type II DM  - A1c was 6.8% remotely  - Managed at home with metformin, held on admission  - Check CBG's and use a SSI with Novolog as needed while in hospital    7. Asthma  - Continue albuterol as needed    8. OSA  - CPAP qHS    DVT prophylaxis: sq heparin  Code Status: Full  Family Communication: Discussed with patient  Consults called: None Admission status: Observation     Vianne Bulls, MD [facility] Pager [phone]  If 7PM-7AM, please contact night-coverage [email or web address] Password [email or web address]  01/17/2018, 1:40 AM

## 2018-01-17 NOTE — Progress Notes (Signed)
PT Cancellation Note  Patient Details Name: EMERSEN CARROLL MRN: [id number] DOB: 1941/02/11   Cancelled Treatment:    Reason Eval/Treat Not Completed: Patient declined, no reason specified Pt refusing despite max encouragement to participate with therapy. Will follow up as schedule allows.   Leighton Ruff, PT, DPT  [facility] Services  Pager: [phone] Office: [phone]    Rudean Hitt 01/17/2018, 4:27 PM

## 2018-01-17 NOTE — ED Provider Notes (Signed)
Medical screening examination/treatment/procedure(s) were conducted as a shared visit with non-physician practitioner(s) and myself.  I personally evaluated the patient during the encounter.  EKG Interpretation  Date/Time:  Thursday January 16 2018 22:51:56 EDT Ventricular Rate:  79 PR Interval:    QRS Duration: 90 QT Interval:  345 QTC Calculation: 396 R Axis:   -24 Text Interpretation:  Sinus rhythm Borderline left axis deviation Low voltage, precordial leads No significant change since last tracing other than rate is slower Confirmed by Pryor Curia [phone]) on 01/16/2018 11:05:02 PM    Patient is a 77 year old male who presents to the emergency department 2 syncopal events in the past week.  States he feels like his scalp is tingling and then he feels very lightheaded.  Labs suggest that he is volume depleted with a sodium of 125 and acute renal failure.  His GFR was greater than 60 and is now 39.  Troponin negative.  Plan to gently hydrate patient and admit.  No headache or focal neurologic deficits.  No chest pain or shortness of breath.  EKG shows no significant change compared to previous.   Brietta Manso, Delice Bison, DO 01/17/18 0102

## 2018-01-17 NOTE — Progress Notes (Signed)
[facility] PROGRESS NOTE  Brian Silva LAG:[id number] DOB: [birth year] DOA: 01/16/2018 PCP: Benito Mccreedy, MD  Assessment/Plan: 1. Syncope  - Presented 9/26 following a syncopal episode that occurred while straining to move bowels and likely neurally-mediated/vasovagal, but also had an episode a few days earlier while seated and at rest. Labs and exam suggestive hypovolemia, EKG unremarkable, no arryhthmia noted on monitor in ED. Positive orthostatics in ED, ekg without acute changes, no signs infection. Labs reveal aki and hyponatremia.Echo with EF 65 % and mild LVH and grade 1 diastolic dysfunction. -repeat orthostatics this afternoon -resume gently fluids depending on results  2. Acute kidney injury  - SCr is 1.64 on admission and 1.66 today, up from 1.08 in June  - Likely prerenal given apparent hypovolemia  - Treated with 500 cc NS in ED and continuous at 58ml/hr for 12 hours -follow urine chemistries, continue to  hold Lasix and lisinopril. -monitor urine output -repeat bmet in am    3. Hyponatremia  - Serum sodium is 125 on admission and is 128 today.   - Patient was felt to be hypovolemic and treated with 500 cc NS in ED and continuous for 12 hours -  continue to hold diuretics, continue a gentle IVF hydration with NS, repeat chem panel in am   -follow serum osmolality and urine osmolality  4. Hypertension  - BP controlled  - Holding lisinopril until renal function stabilizes, continue Norvasc as tolerated    5. Chronic diastolic CHF  - echo with ef 65-70% with mild LVH and grade 1 diastolic dysfunction  - Holding Lasix while hydrating, holding ACE until renal function stabilizes -  Follow daily and I/O's   -obtain daily weight  6. Type II DM  - A1c was 6.8% remotely  - Managed at home with metformin, held on admission  -fair control -continue CBG's and use a SSI with Novolog as needed while in hospital    7. Asthma  - Continue albuterol as needed    -stable at baseline   8. OSA  - CPAP qHS    Code Status: full Family Communication: none present Disposition Plan: home hopefully tomorrow   Consultants:  none  Procedures:  echo  Antibiotics:  none  HPI/Subjective: Patient is a 77 year old male who presented to the emergency department 2/27 after 2 syncopal events in the previou week one event occurred while seated on counch. On day of admission he was sitting on toilet straining to move his bowels when he developed acute lightheadedness and suffered brief loss of conscousness.  Labs suggest that he is volume depleted with a sodium of 125 and acute renal failure.  His GFR was greater than 60 and is now 39.  Troponin negative.  No headache or focal neurologic deficits.  No chest pain or shortness of breath.  EKG shows no significant change compared to previous  Objective: Vitals:   01/17/18 0742 01/17/18 1125  BP: (!) 115/52 (!) 126/52  Pulse: 67 65  Resp: 18 18  Temp: 98.6 F (37 C) 98.4 F (36.9 C)  SpO2: 94% 96%    Intake/Output Summary (Last 24 hours) at 01/17/2018 1547 Last data filed at 01/17/2018 1300 Gross per 24 hour  Intake 1217.06 ml  Output 725 ml  Net 492.06 ml   Filed Weights   01/16/18 2248 01/17/18 0227  Weight: (!) 169.6 kg (!) 172.6 kg    Exam:   General:  Awake alert in no acute distress  Cardiovascular: rrr no mgr trace  LE edema  Respiratory: normal effort BS clear bilaterally no wheeze  Abdomen: obese soft +BS non-tender  Musculoskeletal: joints without swelling/erythema   Data Reviewed: Basic Metabolic Panel: Recent Labs  Lab 01/16/18 2257 01/17/18 0331  NA 125* 128*  K 5.1 4.7  CL 88* 91*  CO2 23 25  GLUCOSE 158* 154*  BUN 34* 36*  CREATININE 1.64* 1.66*  CALCIUM 9.0 8.7*   Liver Function Tests: No results for input(s): AST, ALT, ALKPHOS, BILITOT, PROT, ALBUMIN in the last 168 hours. No results for input(s): LIPASE, AMYLASE in the last 168 hours. No results  for input(s): AMMONIA in the last 168 hours. CBC: Recent Labs  Lab 01/16/18 2257  WBC 13.3*  HGB 12.2*  HCT 37.1*  MCV 86.7  PLT 398   Cardiac Enzymes: Recent Labs  Lab 01/16/18 2257  TROPONINI <0.03   BNP (last 3 results) No results for input(s): BNP in the last 8760 hours.  ProBNP (last 3 results) No results for input(s): PROBNP in the last 8760 hours.  CBG: Recent Labs  Lab 01/16/18 2254 01/17/18 0247 01/17/18 0739 01/17/18 1124  GLUCAP 161* 163* 132* 132*    No results found for this or any previous visit (from the past 240 hour(s)).   Studies: Dg Abd Acute W/chest  Result Date: 01/17/2018 CLINICAL DATA:  77 year old male with constipation. EXAM: DG ABDOMEN ACUTE W/ 1V CHEST COMPARISON:  Abdominal CT dated 10/06/2017 FINDINGS: The lungs are clear. There is no pleural effusion or pneumothorax. The cardiac silhouette is within normal limits. Atherosclerotic calcification of the aortic arch. No bowel dilatation or evidence of obstruction. No free air. Vascular calcification. The osseous structures and soft tissues are grossly unremarkable. IMPRESSION: 1. No acute cardiopulmonary process. 2. No evidence of bowel obstruction. Electronically Signed   By: Anner Crete M.D.   On: 01/17/2018 00:15    Scheduled Meds: . [START ON 01/18/2018] amLODipine  10 mg Oral Daily  . atorvastatin  20 mg Oral q1800  . heparin  5,000 Units Subcutaneous Q8H  . [START ON 01/18/2018] Influenza vac split quadrivalent PF  0.5 mL Intramuscular Tomorrow-1000  . insulin aspart  0-5 Units Subcutaneous QHS  . insulin aspart  0-9 Units Subcutaneous TID WC  . sodium chloride flush  3 mL Intravenous Q12H   Continuous Infusions:  Principal Problem:   Syncope, vasovagal Active Problems:   SLEEP APNEA, OBSTRUCTIVE   Essential hypertension   Asthma   AKI (acute kidney injury) ([place])   Diabetes mellitus type II, non insulin dependent (HCC)   Hyponatremia   Syncope    Time spent: 35  minutes    [facility]  If 7PM-7AM, please contact night-coverage at [email or web address], password [facility] 01/17/2018, 3:47 PM  LOS: 0 days

## 2018-01-17 NOTE — Progress Notes (Signed)
  Pt admitted to the unit. Pt is stable, alert and oriented per baseline. Oriented to room, staff, and call bell. Educated to call for any assistance. Bed in lowest position, call bell within reach- will continue to monitor. 

## 2018-01-17 NOTE — Progress Notes (Signed)
  Echocardiogram 2D Echocardiogram has been performed.  Jennette Dubin 01/17/2018, 10:40 AM

## 2018-01-18 DIAGNOSIS — R55 Syncope and collapse: Secondary | ICD-10-CM | POA: Diagnosis not present

## 2018-01-18 LAB — CBC
HCT: 33.8 % — ABNORMAL LOW (ref 39.0–52.0)
Hemoglobin: 11.1 g/dL — ABNORMAL LOW (ref 13.0–17.0)
MCH: 28.8 pg (ref 26.0–34.0)
MCHC: 32.8 g/dL (ref 30.0–36.0)
MCV: 87.6 fL (ref 78.0–100.0)
Platelets: 378 10*3/uL (ref 150–400)
RBC: 3.86 MIL/uL — ABNORMAL LOW (ref 4.22–5.81)
RDW: 13.7 % (ref 11.5–15.5)
WBC: 9.5 10*3/uL (ref 4.0–10.5)

## 2018-01-18 LAB — GLUCOSE, CAPILLARY
Glucose-Capillary: 107 mg/dL — ABNORMAL HIGH (ref 70–99)
Glucose-Capillary: 117 mg/dL — ABNORMAL HIGH (ref 70–99)

## 2018-01-18 LAB — BASIC METABOLIC PANEL
Anion gap: 7 (ref 5–15)
BUN: 24 mg/dL — ABNORMAL HIGH (ref 8–23)
CO2: 25 mmol/L (ref 22–32)
Calcium: 9.2 mg/dL (ref 8.9–10.3)
Chloride: 103 mmol/L (ref 98–111)
Creatinine, Ser: 1.2 mg/dL (ref 0.61–1.24)
GFR calc Af Amer: >60 mL/min (ref >60)
GFR calc non Af Amer: 57 mL/min — ABNORMAL LOW (ref >60)
Glucose, Bld: 110 mg/dL — ABNORMAL HIGH (ref 70–99)
Potassium: 4.6 mmol/L (ref 3.5–5.1)
Sodium: 135 mmol/L (ref 135–145)

## 2018-01-18 LAB — UREA NITROGEN, URINE: Urea Nitrogen, Ur: 230 mg/dL

## 2018-01-18 LAB — OSMOLALITY: Osmolality: 288 mOsm/kg (ref 275–295)

## 2018-01-18 MED ORDER — DOCUSATE SODIUM 100 MG PO CAPS: 100.0000 mg | ORAL_CAPSULE | Freq: Every day | ORAL | 2 refills | Status: AC | PRN

## 2018-01-18 NOTE — Discharge Summary (Signed)
Physician Discharge Summary  Brian Silva SHF:[id number] DOB: [birth year] DOA: 01/16/2018  PCP: Benito Mccreedy, MD  Admit date: 01/16/2018 Discharge date: 01/18/2018  Time spent: 45 minutes  Recommendations for Outpatient Follow-up:  1. Follow up with PCP 1-2 weeks for evaluation of BP control and constipation. Consider BMET to evaluate kidney function and electrolytes 2. Take stool softener daily    Discharge Diagnoses:  Principal Problem:   Syncope, vasovagal Active Problems:   SLEEP APNEA, OBSTRUCTIVE   Essential hypertension   Asthma   AKI (acute kidney injury) ([place])   Diabetes mellitus type II, non insulin dependent ([place])   Hyponatremia   Syncope   Discharge Condition: stable at baseline  Diet recommendation: heart healthy carb modified  Filed Weights   01/16/18 2248 01/17/18 0227 01/18/18 0608  Weight: (!) 169.6 kg (!) 172.6 kg (!) 170 kg    History of present illness:  Patient 77 yo hx morbid obesity, DM admitted with syncope. Several episodes one likely related to vasovagal as he was straining for BM and he was orthostatic in ED.   Hospital Course:  1.Syncope -Presented 9/26 following a syncopal episode that occurred while straining to move bowels and likely neurally-mediated/vasovagal, but also had an episode a few days earlier while seated and at rest.Labs and exam suggestive hypovolemia, EKG unremarkable, no arryhthmia noted on monitor in ED. Positive orthostatics in ED that are resolved at discharge. Ekg without acutechanges, no signs infection. Labs reveal aki and hyponatremia that are resolved at discharge.Echo with EF 65 % and mild LVH and grade 1 diastolic dysfunction. followup with PCP 1-2 weeks  2.Acute kidney injury -SCr is 1.64 on admission and 1.2 at discharge. Treated with 500 cc NS in EDand continuous at 29ml/hr for 12 hours. Will resume home meds at discharge. Recommend BMET in 1-2 weeks with PCP evaluate kidney  function  3.Hyponatremia -Serum sodium is 125 on admission. Patient was felt to be hypovolemic and treated with 500 cc NS in ED and continuous for 12 hours. Resolved at [place]  4.Hypertension -fair control.   5.Chronic diastolic CHF -echo with ef 65-70% with mild LVH and grade 1 diastolic dysfunction -chest xray no acute cardio pulmonary process.stable at discharge  6.Type II DM -A1c was 6.8% remotely -Managed at home with metformin  7.Asthma -remained stable at baseline   8.OSA -CPAP qHS   Procedures:  echo  Consultations:  none  Discharge Exam: Vitals:   01/18/18 0608 01/18/18 0733  BP: (!) 162/58 (!) 143/54  Pulse: 70 72  Resp: 15 18  Temp: 97.6 F (36.4 C) 98 F (36.7 C)  SpO2: 98%     General: lying in bed watching TV in  No acute distress Cardiovascular: HS distant RRR trace LE edema Respiratory: normal effort BS distant but clear no wheeze or rhonchi  Discharge Instructions    Allergies as of 01/18/2018   No Known Allergies     Medication List    STOP taking these medications   polyethylene glycol packet Commonly known as:  MIRALAX / GLYCOLAX   SENOKOT PO     TAKE these medications   albuterol 108 (90 Base) MCG/ACT inhaler Commonly known as:  PROVENTIL HFA;VENTOLIN HFA Inhale 2 puffs into the lungs every 6 (six) hours as needed. For shortness of breath   albuterol (2.5 MG/3ML) 0.083% nebulizer solution Commonly known as:  PROVENTIL Take 2.5 mg by nebulization every 6 (six) hours as needed for wheezing or shortness of breath.   amLODipine 10 MG tablet Commonly  known as:  NORVASC Take 10 mg by mouth daily.   aspirin EC 81 MG tablet Take 81 mg by mouth daily.   atorvastatin 20 MG tablet Commonly known as:  LIPITOR Take 20 mg by mouth at bedtime.   docusate sodium 100 MG capsule Commonly known as:  COLACE Take 1 capsule (100 mg total) by mouth daily as needed.   furosemide 80 MG  tablet Commonly known as:  LASIX Take 80 mg by mouth 2 (two) times daily.   guaiFENesin 600 MG 12 hr tablet Commonly known as:  MUCINEX Take 600 mg by mouth 2 (two) times daily.   lisinopril 40 MG tablet Commonly known as:  PRINIVIL,ZESTRIL Take 40 mg by mouth daily. What changed:  Another medication with the same name was removed. Continue taking this medication, and follow the directions you see here.   meloxicam 7.5 MG tablet Commonly known as:  MOBIC Take 7.5 mg by mouth daily.   metFORMIN 500 MG tablet Commonly known as:  GLUCOPHAGE Take 500 mg by mouth 2 (two) times daily with a meal.   terbinafine 250 MG tablet Commonly known as:  LAMISIL Take 250 mg by mouth daily.      No Known Allergies    The results of significant diagnostics from this hospitalization (including imaging, microbiology, ancillary and laboratory) are listed below for reference.    Significant Diagnostic Studies: Dg Abd Acute W/chest  Result Date: 01/17/2018 CLINICAL DATA:  77 year old male with constipation. EXAM: DG ABDOMEN ACUTE W/ 1V CHEST COMPARISON:  Abdominal CT dated 10/06/2017 FINDINGS: The lungs are clear. There is no pleural effusion or pneumothorax. The cardiac silhouette is within normal limits. Atherosclerotic calcification of the aortic arch. No bowel dilatation or evidence of obstruction. No free air. Vascular calcification. The osseous structures and soft tissues are grossly unremarkable. IMPRESSION: 1. No acute cardiopulmonary process. 2. No evidence of bowel obstruction. Electronically Signed   By: Anner Crete M.D.   On: 01/17/2018 00:15    Microbiology: No results found for this or any previous visit (from the past 240 hour(s)).   Labs: Basic Metabolic Panel: Recent Labs  Lab 01/16/18 2257 01/17/18 0331 01/18/18 0501  NA 125* 128* 135  K 5.1 4.7 4.6  CL 88* 91* 103  CO2 23 25 25   GLUCOSE 158* 154* 110*  BUN 34* 36* 24*  CREATININE 1.64* 1.66* 1.20  CALCIUM 9.0  8.7* 9.2   Liver Function Tests: No results for input(s): AST, ALT, ALKPHOS, BILITOT, PROT, ALBUMIN in the last 168 hours. No results for input(s): LIPASE, AMYLASE in the last 168 hours. No results for input(s): AMMONIA in the last 168 hours. CBC: Recent Labs  Lab 01/16/18 2257 01/18/18 0501  WBC 13.3* 9.5  HGB 12.2* 11.1*  HCT 37.1* 33.8*  MCV 86.7 87.6  PLT 398 378   Cardiac Enzymes: Recent Labs  Lab 01/16/18 2257  TROPONINI <0.03   BNP: BNP (last 3 results) No results for input(s): BNP in the last 8760 hours.  ProBNP (last 3 results) No results for input(s): PROBNP in the last 8760 hours.  CBG: Recent Labs  Lab 01/17/18 0739 01/17/18 1124 01/17/18 1619 01/17/18 2109 01/18/18 0730  GLUCAP 132* 132* 114* 105* 107*       Signed:  Radene Gunning MD.  [facility] 01/18/2018, 11:01 AM

## 2018-01-18 NOTE — Evaluation (Signed)
Physical Therapy Evaluation Patient Details Name: Brian Silva MRN: [id number] DOB: 26-Sep-1940 Today's Date: 01/18/2018   History of Present Illness  76 y.o. male admitted on 01/16/18 for syncope.  Pt dx with (+) orthostatics in the ED, possible vagal event, acute kidney injury, hyponatremia.  Pt with significant PMH of venous stasis, obesity, HTN, chronic respiratory failure, and asthma.  Clinical Impression  Pt is close to his baseline level of functioning (ambulating with RW) and was able to walk up to the RN station and back to his room without symptoms of lightheadedness and his normal level of DOE (VSS on RA, DOE 2/4 at the end of walking).  Pt feels he is 90% back to his baseline.  PT will follow acutely, but he will not need follow up at discharge.  Pt is hopeful to d/c home today.      Follow Up Recommendations No PT follow up    Equipment Recommendations  None recommended by PT    Recommendations for Other Services   NA    Precautions / Restrictions Precautions Precautions: None      Mobility  Bed Mobility Overal bed mobility: Needs Assistance Bed Mobility: Supine to Sit     Supine to sit: Min assist     General bed mobility comments: Min hand held assist to come to sitting EOB  Transfers Overall transfer level: Needs assistance Equipment used: Rolling walker (2 wheeled) Transfers: Sit to/from Stand Sit to Stand: Min assist         General transfer comment: Min assist with momentum to get to his feet from low bed.    Ambulation/Gait Ambulation/Gait assistance: Min guard Gait Distance (Feet): 150 Feet Assistive device: Rolling walker (2 wheeled) Gait Pattern/deviations: Step-through pattern;Shuffle Gait velocity: decreased Gait velocity interpretation: 1.31 - 2.62 ft/sec, indicative of limited community ambulator General Gait Details: Pt with slow, steady gait pattern, shuffling steps.  When asked how far from normal he feels if 100% is normal he  stated 90%.          Balance Overall balance assessment: Needs assistance Sitting-balance support: Feet supported;No upper extremity supported Sitting balance-Leahy Scale: Good     Standing balance support: Bilateral upper extremity supported Standing balance-Leahy Scale: Fair Standing balance comment: Pt able to stand and urinate without support from RW or therapist.  I kept an eye on him from the door of the bathroom.                              Pertinent Vitals/Pain Pain Assessment: Faces Faces Pain Scale: Hurts even more Pain Location: left great toenail (he had it removed and since he has been here it hurts more).  Pain Descriptors / Indicators: Grimacing;Guarding Pain Intervention(s): Limited activity within patient's tolerance;Monitored during session;Repositioned    Home Living Family/patient expects to be discharged to:: Private residence Living Arrangements: Alone   Type of Home: House Home Access: Ramped entrance     Home Layout: One level Home Equipment: Environmental consultant - 2 wheels;Grab bars - toilet;Grab bars - tub/shower Additional Comments: Has had 2 syncopal events in the past two weeks, otherwise no falls in ~7 years    Prior Function Level of Independence: Independent with assistive device(s)         Comments: Pt normally walks with RW, drives, cooks and cleans and bathes himself.      Hand Dominance   Dominant Hand: Right    Extremity/Trunk Assessment  Upper Extremity Assessment Upper Extremity Assessment: Overall WFL for tasks assessed    Lower Extremity Assessment Lower Extremity Assessment: Overall WFL for tasks assessed    Cervical / Trunk Assessment Cervical / Trunk Assessment: Normal  Communication   Communication: No difficulties  Cognition Arousal/Alertness: Awake/alert Behavior During Therapy: WFL for tasks assessed/performed Overall Cognitive Status: Within Functional Limits for tasks assessed                                                Assessment/Plan    PT Assessment Patient needs continued PT services  PT Problem List Decreased activity tolerance;Decreased balance;Decreased mobility;Decreased knowledge of use of DME;Obesity       PT Treatment Interventions DME instruction;Gait training;Functional mobility training;Therapeutic activities;Therapeutic exercise;Balance training;Patient/family education    PT Goals (Current goals can be found in the Care Plan section)  Acute Rehab PT Goals Patient Stated Goal: to go home so he can get some sleep PT Goal Formulation: With patient Time For Goal Achievement: 02/01/18 Potential to Achieve Goals: Good    Frequency Min 3X/week           AM-PAC PT "6 Clicks" Daily Activity  Outcome Measure Difficulty turning over in bed (including adjusting bedclothes, sheets and blankets)?: A Lot Difficulty moving from lying on back to sitting on the side of the bed? : Unable Difficulty sitting down on and standing up from a chair with arms (e.g., wheelchair, bedside commode, etc,.)?: Unable Help needed moving to and from a bed to chair (including a wheelchair)?: A Little Help needed walking in hospital room?: A Little Help needed climbing 3-5 steps with a railing? : A Little 6 Click Score: 13    End of Session Equipment Utilized During Treatment: Gait belt Activity Tolerance: Patient tolerated treatment well Patient left: in bed;with call bell/phone within reach Nurse Communication: Mobility status PT Visit Diagnosis: Difficulty in walking, not elsewhere classified (R26.2)    Time: 1517-6160 PT Time Calculation (min) (ACUTE ONLY): 25 min   Charges:           Wells Guiles B. Inetta Dicke, PT, DPT  Acute Rehabilitation [phone] pager #[phone] office   PT Evaluation $PT Eval Moderate Complexity: 1 Mod PT Treatments $Gait Training: 8-22 mins        01/18/2018, 9:36 AM

## 2018-01-18 NOTE — Progress Notes (Signed)
CSW was consulted by RN Raquel Sarna for transportation needs for pt's disposition.  CSW confirmed address with pt and left taxi voucher at nurse station.  No further needs noted.  CSW signing off.  Reed Breech LCSWA [phone]

## 2018-01-18 NOTE — Progress Notes (Addendum)
01/18/2018 PT evaluation completed.  Full note to follow.  PT was able to walk 150' with RW without symptoms of lightheadedness.  He is close to his baseline level of function.  From a mobility standpoint he is safe to d/c home and does not currently need any PT f/u at this time.  PT to follow acutely until d/c confirmed.     Thanks,  Barbarann Ehlers. Tony Granquist, PT, DPT  Acute Rehabilitation [phone] pager [phone] office

## 2018-03-10 DIAGNOSIS — I509 Heart failure, unspecified: Secondary | ICD-10-CM | POA: Diagnosis not present

## 2018-03-10 DIAGNOSIS — F329 Major depressive disorder, single episode, unspecified: Secondary | ICD-10-CM | POA: Diagnosis not present

## 2018-03-10 DIAGNOSIS — E119 Type 2 diabetes mellitus without complications: Secondary | ICD-10-CM | POA: Diagnosis not present

## 2018-03-10 DIAGNOSIS — J45909 Unspecified asthma, uncomplicated: Secondary | ICD-10-CM | POA: Diagnosis not present

## 2018-03-10 DIAGNOSIS — E785 Hyperlipidemia, unspecified: Secondary | ICD-10-CM | POA: Diagnosis not present

## 2018-03-10 DIAGNOSIS — E559 Vitamin D deficiency, unspecified: Secondary | ICD-10-CM | POA: Diagnosis not present

## 2018-03-10 DIAGNOSIS — M19041 Primary osteoarthritis, right hand: Secondary | ICD-10-CM | POA: Diagnosis not present

## 2018-03-10 DIAGNOSIS — I10 Essential (primary) hypertension: Secondary | ICD-10-CM | POA: Diagnosis not present

## 2018-05-08 DIAGNOSIS — E785 Hyperlipidemia, unspecified: Secondary | ICD-10-CM | POA: Diagnosis not present

## 2018-05-08 DIAGNOSIS — E119 Type 2 diabetes mellitus without complications: Secondary | ICD-10-CM | POA: Diagnosis not present

## 2018-05-08 DIAGNOSIS — F329 Major depressive disorder, single episode, unspecified: Secondary | ICD-10-CM | POA: Diagnosis not present

## 2018-05-08 DIAGNOSIS — E559 Vitamin D deficiency, unspecified: Secondary | ICD-10-CM | POA: Diagnosis not present

## 2018-05-08 DIAGNOSIS — J45909 Unspecified asthma, uncomplicated: Secondary | ICD-10-CM | POA: Diagnosis not present

## 2018-05-08 DIAGNOSIS — I10 Essential (primary) hypertension: Secondary | ICD-10-CM | POA: Diagnosis not present

## 2018-05-08 DIAGNOSIS — I509 Heart failure, unspecified: Secondary | ICD-10-CM | POA: Diagnosis not present

## 2018-05-08 DIAGNOSIS — M19041 Primary osteoarthritis, right hand: Secondary | ICD-10-CM | POA: Diagnosis not present

## 2018-05-08 DIAGNOSIS — R05 Cough: Secondary | ICD-10-CM | POA: Diagnosis not present

## 2018-05-20 DIAGNOSIS — E785 Hyperlipidemia, unspecified: Secondary | ICD-10-CM | POA: Diagnosis not present

## 2018-05-20 DIAGNOSIS — M19041 Primary osteoarthritis, right hand: Secondary | ICD-10-CM | POA: Diagnosis not present

## 2018-05-20 DIAGNOSIS — F329 Major depressive disorder, single episode, unspecified: Secondary | ICD-10-CM | POA: Diagnosis not present

## 2018-05-20 DIAGNOSIS — J45909 Unspecified asthma, uncomplicated: Secondary | ICD-10-CM | POA: Diagnosis not present

## 2018-05-20 DIAGNOSIS — E559 Vitamin D deficiency, unspecified: Secondary | ICD-10-CM | POA: Diagnosis not present

## 2018-05-20 DIAGNOSIS — E119 Type 2 diabetes mellitus without complications: Secondary | ICD-10-CM | POA: Diagnosis not present

## 2018-05-20 DIAGNOSIS — I5032 Chronic diastolic (congestive) heart failure: Secondary | ICD-10-CM | POA: Diagnosis not present

## 2018-05-20 DIAGNOSIS — H1031 Unspecified acute conjunctivitis, right eye: Secondary | ICD-10-CM | POA: Diagnosis not present

## 2018-05-20 DIAGNOSIS — I10 Essential (primary) hypertension: Secondary | ICD-10-CM | POA: Diagnosis not present

## 2018-11-11 DIAGNOSIS — Z1389 Encounter for screening for other disorder: Secondary | ICD-10-CM | POA: Diagnosis not present

## 2018-11-11 DIAGNOSIS — Z1329 Encounter for screening for other suspected endocrine disorder: Secondary | ICD-10-CM | POA: Diagnosis not present

## 2018-11-11 DIAGNOSIS — Z0001 Encounter for general adult medical examination with abnormal findings: Secondary | ICD-10-CM | POA: Diagnosis not present

## 2018-11-11 DIAGNOSIS — E119 Type 2 diabetes mellitus without complications: Secondary | ICD-10-CM | POA: Diagnosis not present

## 2018-11-11 DIAGNOSIS — Z136 Encounter for screening for cardiovascular disorders: Secondary | ICD-10-CM | POA: Diagnosis not present

## 2018-11-11 DIAGNOSIS — Z131 Encounter for screening for diabetes mellitus: Secondary | ICD-10-CM | POA: Diagnosis not present

## 2018-11-11 DIAGNOSIS — Z125 Encounter for screening for malignant neoplasm of prostate: Secondary | ICD-10-CM | POA: Diagnosis not present

## 2018-11-11 DIAGNOSIS — M19041 Primary osteoarthritis, right hand: Secondary | ICD-10-CM | POA: Diagnosis not present

## 2018-11-11 DIAGNOSIS — E785 Hyperlipidemia, unspecified: Secondary | ICD-10-CM | POA: Diagnosis not present

## 2018-11-11 DIAGNOSIS — J45909 Unspecified asthma, uncomplicated: Secondary | ICD-10-CM | POA: Diagnosis not present

## 2018-11-11 DIAGNOSIS — E559 Vitamin D deficiency, unspecified: Secondary | ICD-10-CM | POA: Diagnosis not present

## 2018-11-11 DIAGNOSIS — I5032 Chronic diastolic (congestive) heart failure: Secondary | ICD-10-CM | POA: Diagnosis not present

## 2018-11-11 DIAGNOSIS — I10 Essential (primary) hypertension: Secondary | ICD-10-CM | POA: Diagnosis not present

## 2018-11-11 DIAGNOSIS — F329 Major depressive disorder, single episode, unspecified: Secondary | ICD-10-CM | POA: Diagnosis not present

## 2019-01-29 ENCOUNTER — Emergency Department (HOSPITAL_COMMUNITY)
Admission: EM | Admit: 2019-01-29 | Discharge: 2019-01-29 | Disposition: A | Discharge status: Home / Self Care | Payer: Medicare Other | Attending: Emergency Medicine | Admitting: Emergency Medicine

## 2019-01-29 ENCOUNTER — Encounter (HOSPITAL_COMMUNITY): Payer: Self-pay | Admitting: Emergency Medicine

## 2019-01-29 DIAGNOSIS — Z5320 Procedure and treatment not carried out because of patient's decision for unspecified reasons: Secondary | ICD-10-CM | POA: Diagnosis not present

## 2019-01-29 DIAGNOSIS — R58 Hemorrhage, not elsewhere classified: Secondary | ICD-10-CM | POA: Diagnosis not present

## 2019-01-29 DIAGNOSIS — I959 Hypotension, unspecified: Secondary | ICD-10-CM | POA: Diagnosis not present

## 2019-01-29 DIAGNOSIS — K625 Hemorrhage of anus and rectum: Secondary | ICD-10-CM | POA: Insufficient documentation

## 2019-01-29 DIAGNOSIS — R0902 Hypoxemia: Secondary | ICD-10-CM | POA: Diagnosis not present

## 2019-01-29 LAB — COMPREHENSIVE METABOLIC PANEL
ALT: 16 U/L (ref 0–44)
AST: 20 U/L (ref 15–41)
Albumin: 4 g/dL (ref 3.5–5.0)
Alkaline Phosphatase: 86 U/L (ref 38–126)
Anion gap: 11 (ref 5–15)
BUN: 38 mg/dL — ABNORMAL HIGH (ref 8–23)
CO2: 20 mmol/L — ABNORMAL LOW (ref 22–32)
Calcium: 8.9 mg/dL (ref 8.9–10.3)
Chloride: 107 mmol/L (ref 98–111)
Creatinine, Ser: 1.66 mg/dL — ABNORMAL HIGH (ref 0.61–1.24)
GFR calc Af Amer: 46 mL/min — ABNORMAL LOW (ref >60)
GFR calc non Af Amer: 39 mL/min — ABNORMAL LOW (ref >60)
Glucose, Bld: 116 mg/dL — ABNORMAL HIGH (ref 70–99)
Potassium: 4.7 mmol/L (ref 3.5–5.1)
Sodium: 138 mmol/L (ref 135–145)
Total Bilirubin: 0.6 mg/dL (ref 0.3–1.2)
Total Protein: 7.5 g/dL (ref 6.5–8.1)

## 2019-01-29 LAB — CBC
HCT: 35.8 % — ABNORMAL LOW (ref 39.0–52.0)
Hemoglobin: 11 g/dL — ABNORMAL LOW (ref 13.0–17.0)
MCH: 27.7 pg (ref 26.0–34.0)
MCHC: 30.7 g/dL (ref 30.0–36.0)
MCV: 90.2 fL (ref 80.0–100.0)
Platelets: 494 10*3/uL — ABNORMAL HIGH (ref 150–400)
RBC: 3.97 MIL/uL — ABNORMAL LOW (ref 4.22–5.81)
RDW: 14.2 % (ref 11.5–15.5)
WBC: 11.3 10*3/uL — ABNORMAL HIGH (ref 4.0–10.5)
nRBC: 0 % (ref 0.0–0.2)

## 2019-01-29 LAB — TYPE AND SCREEN
ABO/RH(D): B POS
Antibody Screen: NEGATIVE

## 2019-01-29 NOTE — ED Triage Notes (Signed)
Per EMS, patient from home, lives alone, c/o bright red blood with bowel movements x3 days. Hx of same. Reports rectal pain while sitting. Denies weakness. Ambulatory with walker.

## 2019-01-29 NOTE — ED Notes (Signed)
Per registration, patient reports he is leaving.

## 2019-05-07 DIAGNOSIS — E785 Hyperlipidemia, unspecified: Secondary | ICD-10-CM | POA: Diagnosis not present

## 2019-05-07 DIAGNOSIS — E559 Vitamin D deficiency, unspecified: Secondary | ICD-10-CM | POA: Diagnosis not present

## 2019-05-07 DIAGNOSIS — I5032 Chronic diastolic (congestive) heart failure: Secondary | ICD-10-CM | POA: Diagnosis not present

## 2019-05-07 DIAGNOSIS — J301 Allergic rhinitis due to pollen: Secondary | ICD-10-CM | POA: Diagnosis not present

## 2019-05-07 DIAGNOSIS — E119 Type 2 diabetes mellitus without complications: Secondary | ICD-10-CM | POA: Diagnosis not present

## 2019-05-07 DIAGNOSIS — M19041 Primary osteoarthritis, right hand: Secondary | ICD-10-CM | POA: Diagnosis not present

## 2019-05-07 DIAGNOSIS — J45909 Unspecified asthma, uncomplicated: Secondary | ICD-10-CM | POA: Diagnosis not present

## 2019-05-07 DIAGNOSIS — I10 Essential (primary) hypertension: Secondary | ICD-10-CM | POA: Diagnosis not present

## 2019-05-07 DIAGNOSIS — F329 Major depressive disorder, single episode, unspecified: Secondary | ICD-10-CM | POA: Diagnosis not present

## 2019-06-02 ENCOUNTER — Emergency Department (HOSPITAL_COMMUNITY): Payer: Medicare Other

## 2019-06-02 ENCOUNTER — Emergency Department (HOSPITAL_COMMUNITY)
Admission: EM | Admit: 2019-06-02 | Discharge: 2019-06-03 | Disposition: A | Discharge status: Home / Self Care | Payer: Medicare Other | Attending: Emergency Medicine | Admitting: Emergency Medicine

## 2019-06-02 ENCOUNTER — Encounter (HOSPITAL_COMMUNITY): Payer: Self-pay

## 2019-06-02 DIAGNOSIS — R600 Localized edema: Secondary | ICD-10-CM | POA: Diagnosis not present

## 2019-06-02 DIAGNOSIS — W19XXXA Unspecified fall, initial encounter: Secondary | ICD-10-CM | POA: Diagnosis not present

## 2019-06-02 DIAGNOSIS — Z7982 Long term (current) use of aspirin: Secondary | ICD-10-CM | POA: Diagnosis not present

## 2019-06-02 DIAGNOSIS — R531 Weakness: Secondary | ICD-10-CM | POA: Insufficient documentation

## 2019-06-02 DIAGNOSIS — E119 Type 2 diabetes mellitus without complications: Secondary | ICD-10-CM | POA: Insufficient documentation

## 2019-06-02 DIAGNOSIS — Z79899 Other long term (current) drug therapy: Secondary | ICD-10-CM | POA: Diagnosis not present

## 2019-06-02 DIAGNOSIS — R5381 Other malaise: Secondary | ICD-10-CM | POA: Diagnosis not present

## 2019-06-02 DIAGNOSIS — I10 Essential (primary) hypertension: Secondary | ICD-10-CM | POA: Diagnosis not present

## 2019-06-02 LAB — CBG MONITORING, ED: Glucose-Capillary: 137 mg/dL — ABNORMAL HIGH (ref 70–99)

## 2019-06-02 NOTE — ED Triage Notes (Signed)
Pt comes from home via [facility] for generalized weakness, normally walks with walker. Pt was unable to ambulate with multiple people assisting him, house was covered in feces, needs social work consult, has home health aide already that comes once a week. Pt has no other complaints.

## 2019-06-02 NOTE — ED Provider Notes (Signed)
[facility] EMERGENCY DEPARTMENT Provider Note   CSN: [id number] Arrival date & time: 06/02/19  2313     History Chief Complaint  Patient presents with  . Weakness    Brian Silva is a 79 y.o. male.  79 yo M with a chief complaints of leg weakness.  Patient states this just happened today.  States that he had a prolonged time sitting on the toilet and when he does that his legs fall asleep.  He then had trouble getting up from a seated position.  Ended up calling 911.  Took multiple people to get him up and they were concerned about the state of the home.  Ended up bringing him to the ED for evaluation.  Patient denies any trouble taking care of himself at home.  Says he does one of them to get him up and move him back to his chair.  Per EMS there was feces all throughout the house and it was in a bad state.  Patient denies any chest pain denies shortness of breath denies nausea vomiting or diarrhea denies decreased oral intake.  He states that the real issue is just that his toilet is very low that he sits on.  Has had no issues walking until today per him.  The history is provided by the patient.  Weakness Severity:  Moderate Onset quality:  Gradual Duration:  2 hours Timing:  Constant Progression:  Worsening Chronicity:  New Relieved by:  Nothing Worsened by:  Nothing Ineffective treatments:  None tried Associated symptoms: no abdominal pain, no arthralgias, no chest pain, no diarrhea, no fever, no headaches, no myalgias, no shortness of breath and no vomiting        Past Medical History:  Diagnosis Date  . Asthma   . Chronic respiratory failure (HCC)    on 2L oxygen  . Hyperlipidemia   . Hypertension   . Obese   . OSA (obstructive sleep apnea)    Sleep study 08/2010 showed mild to moderate obstructive sleep apnea/hypopnea syndrome. However, never initiated CPAP  . Venous stasis    Venous Statis Disease (03/28/2004)    Patient Active Problem List    Diagnosis Date Noted  . AKI (acute kidney injury) ([place]) 01/17/2018  . Syncope, vasovagal 01/17/2018  . Diabetes mellitus type II, non insulin dependent ([place]) 01/17/2018  . Hyponatremia 01/17/2018  . Syncope 01/17/2018  . Depression 10/25/2015  . Hyperlipidemia 10/25/2015  . TINEA PEDIS 01/04/2010  . TINEA VERSICOLOR 01/04/2010  . URI 01/04/2010  . HYPERGLYCEMIA 12/02/2008  . ABDOMINAL WALL HERNIA 12/01/2007  . OBESITY, MORBID 04/04/2007  . SLEEP APNEA, OBSTRUCTIVE 04/04/2007  . LOW BACK PAIN, CHRONIC 12/07/2004  . HYPERLIPIDEMIA 08/12/2002  . ALLERGIC RHINITIS, SEASONAL 08/12/2002  . Essential hypertension 04/01/2000  . Asthma 10/11/1997  . SUPERFICIAL PHLEBITIS 05/06/1990    History reviewed. No pertinent surgical history.     No family history on file.  Social History   Tobacco Use  . Smoking status: Never Smoker  . Smokeless tobacco: Never Used  Substance Use Topics  . Alcohol use: Yes    Comment: occasionally   . Drug use: Never    Home Medications Prior to Admission medications   Medication Sig Start Date End Date Taking? Authorizing Provider  albuterol (PROVENTIL HFA;VENTOLIN HFA) 108 (90 Base) MCG/ACT inhaler Inhale 2 puffs into the lungs every 6 (six) hours as needed. For shortness of breath 10/28/15  Yes Theodis Blaze, MD  albuterol (PROVENTIL) (2.5 MG/3ML) 0.083%  nebulizer solution Take 2.5 mg by nebulization every 6 (six) hours as needed for wheezing or shortness of breath.  08/16/17  Yes [provider]  amLODipine (NORVASC) 10 MG tablet Take 10 mg by mouth daily.  09/03/17  Yes [provider]  aspirin EC 81 MG tablet Take 81 mg by mouth daily.   Yes [provider]  atorvastatin (LIPITOR) 20 MG tablet Take 20 mg by mouth at bedtime.     Yes [provider]  citalopram (CELEXA) 20 MG tablet Take 20 mg by mouth daily. 02/23/19  Yes [provider]  fluticasone (FLONASE) 50 MCG/ACT nasal spray Place 1 spray into  both nostrils daily. 05/07/19  Yes [provider]  furosemide (LASIX) 80 MG tablet Take 80 mg by mouth 2 (two) times daily.    Yes [provider]  lisinopril (PRINIVIL,ZESTRIL) 40 MG tablet Take 40 mg by mouth daily.   Yes [provider]  meloxicam (MOBIC) 7.5 MG tablet Take 7.5 mg by mouth daily.  09/28/17  Yes [provider]  metFORMIN (GLUCOPHAGE) 500 MG tablet Take 500 mg by mouth 2 (two) times daily with a meal.  07/14/17  Yes [provider]    Allergies    Patient has no known allergies.  Review of Systems   Review of Systems  Constitutional: Negative for chills and fever.  HENT: Negative for congestion and facial swelling.   Eyes: Negative for discharge and visual disturbance.  Respiratory: Negative for shortness of breath.   Cardiovascular: Negative for chest pain and palpitations.  Gastrointestinal: Negative for abdominal pain, diarrhea and vomiting.  Musculoskeletal: Negative for arthralgias and myalgias.  Skin: Negative for color change and rash.  Neurological: Positive for weakness. Negative for tremors, syncope and headaches.  Psychiatric/Behavioral: Negative for confusion and dysphoric mood.    Physical Exam Updated Vital Signs BP (!) 115/58   Pulse 80   Temp 97.8 F (36.6 C) (Oral)   Resp 15   SpO2 95%   Physical Exam Vitals and nursing note reviewed.  Constitutional:      Appearance: He is well-developed. He is obese.     Comments: Pale, chronically ill appearing   HENT:     Head: Normocephalic and atraumatic.  Eyes:     Pupils: Pupils are equal, round, and reactive to light.  Neck:     Vascular: No JVD.  Cardiovascular:     Rate and Rhythm: Normal rate and regular rhythm.     Heart sounds: No murmur. No friction rub. No gallop.   Pulmonary:     Effort: No respiratory distress.     Breath sounds: No wheezing.  Abdominal:     General: There is no distension.     Tenderness: There is no abdominal  tenderness. There is no guarding or rebound.  Musculoskeletal:        General: Normal range of motion.     Cervical back: Normal range of motion and neck supple.     Right lower leg: Edema present.     Left lower leg: Edema present.     Comments: 2+ and equal   Skin:    Coloration: Skin is pale.     Findings: No rash.  Neurological:     Mental Status: He is alert and oriented to person, place, and time.  Psychiatric:        Behavior: Behavior normal.     ED Results / Procedures / Treatments   Labs (all labs ordered are  listed, but only abnormal results are displayed) Labs Reviewed  BASIC METABOLIC PANEL - Abnormal; Notable for the following components:      Result Value   Glucose, Bld 142 (*)    BUN 27 (*)    Creatinine, Ser 1.60 (*)    Calcium 8.5 (*)    GFR calc non Af Amer 41 (*)    GFR calc Af Amer 48 (*)    All other components within normal limits  CBC - Abnormal; Notable for the following components:   WBC 12.4 (*)    RBC 4.10 (*)    Hemoglobin 10.7 (*)    HCT 34.0 (*)    RDW 15.7 (*)    Platelets 477 (*)    All other components within normal limits  URINALYSIS, ROUTINE W REFLEX MICROSCOPIC - Abnormal; Notable for the following components:   APPearance HAZY (*)    Glucose, UA 50 (*)    Hgb urine dipstick SMALL (*)    Protein, ur 30 (*)    Bacteria, UA RARE (*)    All other components within normal limits  CK - Abnormal; Notable for the following components:   Total CK 24 (*)    All other components within normal limits  CBG MONITORING, ED - Abnormal; Notable for the following components:   Glucose-Capillary 137 (*)    All other components within normal limits    EKG EKG Interpretation  Date/Time:  Tuesday June 02 2019 23:50:53 EST Ventricular Rate:  92 PR Interval:    QRS Duration: 88 QT Interval:  325 QTC Calculation: 402 R Axis:   -46 Text Interpretation: Sinus rhythm Left anterior fascicular block Low voltage, precordial leads Consider  anterior infarct No significant change since last tracing Confirmed by Deno Etienne [phone]) on 06/02/2019 11:57:48 PM   Radiology DG Chest Port 1 View  Result Date: 06/03/2019 CLINICAL DATA:  Weakness, bright red blood per rectum, rectal pain well seated EXAM: PORTABLE CHEST 1 VIEW COMPARISON:  CT chest 10/26/2015 FINDINGS: Imaging quality degraded by body habitus. No focal consolidative opacity is seen. No consolidation, features of edema, pneumothorax, or effusion. The aorta is calcified. The remaining cardiomediastinal contours are unremarkable. Degenerative changes are present in the imaged spine and shoulders. Remote posttraumatic deformity of the right clavicle. Telemetry leads overlie the chest. IMPRESSION: Accounting for body habitus, the lungs are clear. Electronically Signed   By: Lovena Le M.D.   On: 06/03/2019 00:16    Procedures Procedures (including critical care time)  Medications Ordered in ED Medications - No data to display  ED Course  I have reviewed the triage vital signs and the nursing notes.  Pertinent labs & imaging results that were available during my care of the patient were reviewed by me and considered in my medical decision making (see chart for details).    MDM Rules/Calculators/A&P                      79 yo M who was unable to get up off his toilet today.  Wanted EMS just to help him back to his chair but they brought him into the ED for evaluation.  We will have social work evaluate for possible home health versus SNF placement.  Obtain lab work chest x-ray EKG reassess.  UA without infection.  Chest x-ray viewed by me without focal infiltrate or pneumothorax.  No significant electrolyte abnormality.  Anemia is at baseline.  CK is 24.  I discussed the results with  the patient.  I discussed the quandary with him having such difficulty at home moving around.  Patient at this point would like to be at home if at all possible.  He would only consider going to a  skilled nursing facility if the trial at home did not work out.  As social work is not available at this hour will put in home health face-to-face orders.  We will discharge him home.  If he happens to still be here in the morning while awaiting transport then he can be assessed at bedside.  4:00 AM:  I have discussed the diagnosis/risks/treatment options with the patient and believe the pt to be eligible for discharge home to follow-up with PCP. We also discussed returning to the ED immediately if new or worsening sx occur. We discussed the sx which are most concerning (e.g., sudden worsening pain, fever, inability to tolerate by mouth) that necessitate immediate return. Medications administered to the patient during their visit and any new prescriptions provided to the patient are listed below.  Medications given during this visit Medications - No data to display   The patient appears reasonably screen and/or stabilized for discharge and I doubt any other medical condition or other [facility] requiring further screening, evaluation, or treatment in the ED at this time prior to discharge.   Final Clinical Impression(s) / ED Diagnoses Final diagnoses:  Weakness    Rx / DC Orders ED Discharge Orders    None       Deno Etienne, DO 06/03/19 0400

## 2019-06-03 DIAGNOSIS — I959 Hypotension, unspecified: Secondary | ICD-10-CM | POA: Diagnosis not present

## 2019-06-03 DIAGNOSIS — Z743 Need for continuous supervision: Secondary | ICD-10-CM | POA: Diagnosis not present

## 2019-06-03 DIAGNOSIS — R279 Unspecified lack of coordination: Secondary | ICD-10-CM | POA: Diagnosis not present

## 2019-06-03 DIAGNOSIS — R531 Weakness: Secondary | ICD-10-CM | POA: Diagnosis not present

## 2019-06-03 LAB — BASIC METABOLIC PANEL
Anion gap: 13 (ref 5–15)
BUN: 27 mg/dL — ABNORMAL HIGH (ref 8–23)
CO2: 26 mmol/L (ref 22–32)
Calcium: 8.5 mg/dL — ABNORMAL LOW (ref 8.9–10.3)
Chloride: 98 mmol/L (ref 98–111)
Creatinine, Ser: 1.6 mg/dL — ABNORMAL HIGH (ref 0.61–1.24)
GFR calc Af Amer: 48 mL/min — ABNORMAL LOW (ref >60)
GFR calc non Af Amer: 41 mL/min — ABNORMAL LOW (ref >60)
Glucose, Bld: 142 mg/dL — ABNORMAL HIGH (ref 70–99)
Potassium: 4 mmol/L (ref 3.5–5.1)
Sodium: 137 mmol/L (ref 135–145)

## 2019-06-03 LAB — URINALYSIS, ROUTINE W REFLEX MICROSCOPIC
Bilirubin Urine: NEGATIVE
Glucose, UA: 50 mg/dL — AB
Ketones, ur: NEGATIVE mg/dL
Leukocytes,Ua: NEGATIVE
Nitrite: NEGATIVE
Protein, ur: 30 mg/dL — AB
Specific Gravity, Urine: 1.014 (ref 1.005–1.030)
pH: 6 (ref 5.0–8.0)

## 2019-06-03 LAB — CBC
HCT: 34 % — ABNORMAL LOW (ref 39.0–52.0)
Hemoglobin: 10.7 g/dL — ABNORMAL LOW (ref 13.0–17.0)
MCH: 26.1 pg (ref 26.0–34.0)
MCHC: 31.5 g/dL (ref 30.0–36.0)
MCV: 82.9 fL (ref 80.0–100.0)
Platelets: 477 10*3/uL — ABNORMAL HIGH (ref 150–400)
RBC: 4.1 MIL/uL — ABNORMAL LOW (ref 4.22–5.81)
RDW: 15.7 % — ABNORMAL HIGH (ref 11.5–15.5)
WBC: 12.4 10*3/uL — ABNORMAL HIGH (ref 4.0–10.5)
nRBC: 0 % (ref 0.0–0.2)

## 2019-06-03 LAB — CK: Total CK: 24 U/L — ABNORMAL LOW (ref 49–397)

## 2019-06-03 NOTE — ED Notes (Signed)
[facility] Called to take patient home

## 2019-06-03 NOTE — ED Notes (Signed)
[facility] arrived to take pt home, pt then stated that he is not sure if he wants to go home and does not think that he can care for himself at home. MD made aware and pt will wait until AM social work can see him.

## 2019-06-03 NOTE — Discharge Instructions (Signed)
I am going to try and arrange for home health to come and help evaluate her home situation and assisted for safety.  There also will have physical therapy show up to try and assist you.  Please return for worsening weakness or if you feel you are unsafe at home.

## 2019-06-03 NOTE — TOC Progression Note (Signed)
Transition of Care [facility]) - Progression Note    Patient Details  Name: Brian Silva MRN: [id number] Date of Birth: [birth year]  [facility] CM/SW Contact  Fuller Mandril, RN Phone Number: 06/03/2019, 8:45 AM  Clinical Narrative:    Reagen Goates J. Clydene Laming, RN, BSN, [place] [phone] EDSW  Spoke with pt at bedside regarding discharge planning for [facility]. Offered pt list of home health agencies to choose from.  Pt chose [facility] to render services. Amy Hyatt of [facility] notified. Patient made aware that [facility] will be in contact in 24-48 hours.  No DME needs identified at this time.    Expected Discharge Plan: [place] Barriers to Discharge: Barriers Resolved  Expected Discharge Plan and Services Expected Discharge Plan: [place]   Discharge Planning Services: CM Consult   Living arrangements for the past 2 months: Single Family Home                           HH Arranged: RN, PT, OT, Nurse's Aide, Social Work [employer] Agency: [facility] [place] Date HH Agency Contacted: 06/03/19 Time [place]: [phone] Representative spoke with at [place]: [place] ([place]) Interventions    Readmission Risk Interventions No flowsheet data found.

## 2019-06-03 NOTE — ED Provider Notes (Signed)
Signout from Dr. Tyrone Nine.  79 year old male here with generalized weakness unable to ambulate safely.  Having trouble caring for himself at home. Lab work unremarkable.  Patient is pending social work consult for placement versus home with services. Physical Exam  BP (!) 151/53 (BP Location: Right Arm)   Pulse 77   Temp 97.8 F (36.6 C) (Oral)   Resp 17   SpO2 97%   Physical Exam  ED Course/Procedures     Procedures  MDM  TOC has evaluated the patient and he is set up with services at home.       Hayden Rasmussen, MD 06/03/19 [phone]

## 2019-09-10 ENCOUNTER — Emergency Department (HOSPITAL_COMMUNITY): Payer: Medicare Other

## 2019-09-10 ENCOUNTER — Encounter (HOSPITAL_COMMUNITY): Payer: Self-pay

## 2019-09-10 ENCOUNTER — Inpatient Hospital Stay (HOSPITAL_COMMUNITY)
Admission: EM | Admit: 2019-09-10 | Discharge: 2019-09-14 | DRG: 312 | Disposition: A | Discharge status: Skilled Nursing Facility | Payer: Medicare Other | Attending: Family Medicine | Admitting: Family Medicine

## 2019-09-10 DIAGNOSIS — S3992XA Unspecified injury of lower back, initial encounter: Secondary | ICD-10-CM | POA: Diagnosis not present

## 2019-09-10 DIAGNOSIS — E1169 Type 2 diabetes mellitus with other specified complication: Secondary | ICD-10-CM | POA: Diagnosis present

## 2019-09-10 DIAGNOSIS — Z791 Long term (current) use of non-steroidal anti-inflammatories (NSAID): Secondary | ICD-10-CM | POA: Diagnosis not present

## 2019-09-10 DIAGNOSIS — S22080A Wedge compression fracture of T11-T12 vertebra, initial encounter for closed fracture: Secondary | ICD-10-CM | POA: Diagnosis not present

## 2019-09-10 DIAGNOSIS — J45909 Unspecified asthma, uncomplicated: Secondary | ICD-10-CM | POA: Diagnosis not present

## 2019-09-10 DIAGNOSIS — G904 Autonomic dysreflexia: Secondary | ICD-10-CM | POA: Diagnosis present

## 2019-09-10 DIAGNOSIS — M79661 Pain in right lower leg: Secondary | ICD-10-CM | POA: Diagnosis not present

## 2019-09-10 DIAGNOSIS — M2550 Pain in unspecified joint: Secondary | ICD-10-CM | POA: Diagnosis not present

## 2019-09-10 DIAGNOSIS — E119 Type 2 diabetes mellitus without complications: Secondary | ICD-10-CM | POA: Diagnosis not present

## 2019-09-10 DIAGNOSIS — Y92009 Unspecified place in unspecified non-institutional (private) residence as the place of occurrence of the external cause: Secondary | ICD-10-CM | POA: Diagnosis not present

## 2019-09-10 DIAGNOSIS — J9610 Chronic respiratory failure, unspecified whether with hypoxia or hypercapnia: Secondary | ICD-10-CM | POA: Diagnosis present

## 2019-09-10 DIAGNOSIS — M4856XA Collapsed vertebra, not elsewhere classified, lumbar region, initial encounter for fracture: Secondary | ICD-10-CM | POA: Diagnosis present

## 2019-09-10 DIAGNOSIS — W1811XA Fall from or off toilet without subsequent striking against object, initial encounter: Secondary | ICD-10-CM | POA: Diagnosis present

## 2019-09-10 DIAGNOSIS — M6281 Muscle weakness (generalized): Secondary | ICD-10-CM | POA: Diagnosis not present

## 2019-09-10 DIAGNOSIS — G4733 Obstructive sleep apnea (adult) (pediatric): Secondary | ICD-10-CM | POA: Diagnosis not present

## 2019-09-10 DIAGNOSIS — Z7984 Long term (current) use of oral hypoglycemic drugs: Secondary | ICD-10-CM | POA: Diagnosis not present

## 2019-09-10 DIAGNOSIS — Z9981 Dependence on supplemental oxygen: Secondary | ICD-10-CM | POA: Diagnosis not present

## 2019-09-10 DIAGNOSIS — Z7401 Bed confinement status: Secondary | ICD-10-CM | POA: Diagnosis not present

## 2019-09-10 DIAGNOSIS — M5489 Other dorsalgia: Secondary | ICD-10-CM | POA: Diagnosis not present

## 2019-09-10 DIAGNOSIS — Y92012 Bathroom of single-family (private) house as the place of occurrence of the external cause: Secondary | ICD-10-CM

## 2019-09-10 DIAGNOSIS — W19XXXA Unspecified fall, initial encounter: Secondary | ICD-10-CM

## 2019-09-10 DIAGNOSIS — Z79899 Other long term (current) drug therapy: Secondary | ICD-10-CM | POA: Diagnosis not present

## 2019-09-10 DIAGNOSIS — R29898 Other symptoms and signs involving the musculoskeletal system: Secondary | ICD-10-CM | POA: Diagnosis not present

## 2019-09-10 DIAGNOSIS — Z03818 Encounter for observation for suspected exposure to other biological agents ruled out: Secondary | ICD-10-CM | POA: Diagnosis not present

## 2019-09-10 DIAGNOSIS — I10 Essential (primary) hypertension: Secondary | ICD-10-CM | POA: Diagnosis present

## 2019-09-10 DIAGNOSIS — M545 Low back pain, unspecified: Secondary | ICD-10-CM

## 2019-09-10 DIAGNOSIS — Z6841 Body Mass Index (BMI) 40.0 and over, adult: Secondary | ICD-10-CM | POA: Diagnosis not present

## 2019-09-10 DIAGNOSIS — M549 Dorsalgia, unspecified: Secondary | ICD-10-CM | POA: Diagnosis not present

## 2019-09-10 DIAGNOSIS — R2681 Unsteadiness on feet: Secondary | ICD-10-CM | POA: Diagnosis not present

## 2019-09-10 DIAGNOSIS — M5136 Other intervertebral disc degeneration, lumbar region: Secondary | ICD-10-CM | POA: Diagnosis not present

## 2019-09-10 DIAGNOSIS — I951 Orthostatic hypotension: Secondary | ICD-10-CM | POA: Diagnosis present

## 2019-09-10 DIAGNOSIS — S22000A Wedge compression fracture of unspecified thoracic vertebra, initial encounter for closed fracture: Secondary | ICD-10-CM | POA: Diagnosis present

## 2019-09-10 DIAGNOSIS — Z20822 Contact with and (suspected) exposure to covid-19: Secondary | ICD-10-CM | POA: Diagnosis present

## 2019-09-10 DIAGNOSIS — Z7189 Other specified counseling: Secondary | ICD-10-CM | POA: Diagnosis not present

## 2019-09-10 DIAGNOSIS — R41841 Cognitive communication deficit: Secondary | ICD-10-CM | POA: Diagnosis not present

## 2019-09-10 DIAGNOSIS — M4854XA Collapsed vertebra, not elsewhere classified, thoracic region, initial encounter for fracture: Secondary | ICD-10-CM | POA: Diagnosis present

## 2019-09-10 DIAGNOSIS — R52 Pain, unspecified: Secondary | ICD-10-CM | POA: Diagnosis not present

## 2019-09-10 DIAGNOSIS — R5381 Other malaise: Secondary | ICD-10-CM | POA: Diagnosis not present

## 2019-09-10 DIAGNOSIS — E785 Hyperlipidemia, unspecified: Secondary | ICD-10-CM | POA: Diagnosis not present

## 2019-09-10 DIAGNOSIS — S22080D Wedge compression fracture of T11-T12 vertebra, subsequent encounter for fracture with routine healing: Secondary | ICD-10-CM | POA: Diagnosis not present

## 2019-09-10 DIAGNOSIS — I700 Atherosclerosis of aorta: Secondary | ICD-10-CM | POA: Diagnosis not present

## 2019-09-10 DIAGNOSIS — R0902 Hypoxemia: Secondary | ICD-10-CM | POA: Diagnosis not present

## 2019-09-10 DIAGNOSIS — R519 Headache, unspecified: Secondary | ICD-10-CM | POA: Diagnosis not present

## 2019-09-10 DIAGNOSIS — R55 Syncope and collapse: Secondary | ICD-10-CM | POA: Diagnosis not present

## 2019-09-10 LAB — I-STAT CHEM 8, ED
BUN: 22 mg/dL (ref 8–23)
Calcium, Ion: 1.22 mmol/L (ref 1.15–1.40)
Chloride: 104 mmol/L (ref 98–111)
Creatinine, Ser: 1.6 mg/dL — ABNORMAL HIGH (ref 0.61–1.24)
Glucose, Bld: 153 mg/dL — ABNORMAL HIGH (ref 70–99)
HCT: 32 % — ABNORMAL LOW (ref 39.0–52.0)
Hemoglobin: 10.9 g/dL — ABNORMAL LOW (ref 13.0–17.0)
Potassium: 4 mmol/L (ref 3.5–5.1)
Sodium: 138 mmol/L (ref 135–145)
TCO2: 31 mmol/L (ref 22–32)

## 2019-09-10 LAB — URINALYSIS, ROUTINE W REFLEX MICROSCOPIC
Bilirubin Urine: NEGATIVE
Glucose, UA: NEGATIVE mg/dL
Hgb urine dipstick: NEGATIVE
Ketones, ur: NEGATIVE mg/dL
Leukocytes,Ua: NEGATIVE
Nitrite: NEGATIVE
Protein, ur: NEGATIVE mg/dL
Specific Gravity, Urine: 1.018 (ref 1.005–1.030)
pH: 5 (ref 5.0–8.0)

## 2019-09-10 LAB — CBC WITH DIFFERENTIAL/PLATELET
Abs Immature Granulocytes: 0.05 10*3/uL (ref 0.00–0.07)
Basophils Absolute: 0.1 10*3/uL (ref 0.0–0.1)
Basophils Relative: 1 %
Eosinophils Absolute: 0 10*3/uL (ref 0.0–0.5)
Eosinophils Relative: 0 %
HCT: 36 % — ABNORMAL LOW (ref 39.0–52.0)
Hemoglobin: 11.2 g/dL — ABNORMAL LOW (ref 13.0–17.0)
Immature Granulocytes: 0 %
Lymphocytes Relative: 4 %
Lymphs Abs: 0.6 10*3/uL — ABNORMAL LOW (ref 0.7–4.0)
MCH: 26.1 pg (ref 26.0–34.0)
MCHC: 31.1 g/dL (ref 30.0–36.0)
MCV: 83.9 fL (ref 80.0–100.0)
Monocytes Absolute: 0.7 10*3/uL (ref 0.1–1.0)
Monocytes Relative: 6 %
Neutro Abs: 11.8 10*3/uL — ABNORMAL HIGH (ref 1.7–7.7)
Neutrophils Relative %: 89 %
Platelets: 507 10*3/uL — ABNORMAL HIGH (ref 150–400)
RBC: 4.29 MIL/uL (ref 4.22–5.81)
RDW: 15 % (ref 11.5–15.5)
WBC: 13.2 10*3/uL — ABNORMAL HIGH (ref 4.0–10.5)
nRBC: 0 % (ref 0.0–0.2)

## 2019-09-10 LAB — COMPREHENSIVE METABOLIC PANEL
ALT: 13 U/L (ref 0–44)
AST: 16 U/L (ref 15–41)
Albumin: 2.9 g/dL — ABNORMAL LOW (ref 3.5–5.0)
Alkaline Phosphatase: 98 U/L (ref 38–126)
Anion gap: 14 (ref 5–15)
BUN: 20 mg/dL (ref 8–23)
CO2: 25 mmol/L (ref 22–32)
Calcium: 9.4 mg/dL (ref 8.9–10.3)
Chloride: 100 mmol/L (ref 98–111)
Creatinine, Ser: 1.52 mg/dL — ABNORMAL HIGH (ref 0.61–1.24)
GFR calc Af Amer: 51 mL/min — ABNORMAL LOW (ref >60)
GFR calc non Af Amer: 44 mL/min — ABNORMAL LOW (ref >60)
Glucose, Bld: 157 mg/dL — ABNORMAL HIGH (ref 70–99)
Potassium: 4.2 mmol/L (ref 3.5–5.1)
Sodium: 139 mmol/L (ref 135–145)
Total Bilirubin: 0.8 mg/dL (ref 0.3–1.2)
Total Protein: 6.9 g/dL (ref 6.5–8.1)

## 2019-09-10 LAB — GLUCOSE, CAPILLARY: Glucose-Capillary: 125 mg/dL — ABNORMAL HIGH (ref 70–99)

## 2019-09-10 LAB — CK: Total CK: 36 U/L — ABNORMAL LOW (ref 49–397)

## 2019-09-10 LAB — SARS CORONAVIRUS 2 BY RT PCR (HOSPITAL ORDER, PERFORMED IN ~~LOC~~ HOSPITAL LAB): SARS Coronavirus 2: NEGATIVE

## 2019-09-10 MED ORDER — SODIUM CHLORIDE 0.9 % IV SOLN: INTRAVENOUS | Status: DC

## 2019-09-10 MED ORDER — ENOXAPARIN SODIUM 40 MG/0.4ML ~~LOC~~ SOLN
40.0000 mg | Freq: Every day | SUBCUTANEOUS | Status: DC
  Administered 2019-09-11 – 2019-09-14 (×4): 40 mg via SUBCUTANEOUS
  Filled 2019-09-10 (×3): qty 0.4

## 2019-09-10 MED ORDER — MORPHINE SULFATE (PF) 2 MG/ML IV SOLN
2.0000 mg | INTRAVENOUS | Status: DC | PRN
  Administered 2019-09-11 (×2): 2 mg via INTRAVENOUS
  Filled 2019-09-10 (×2): qty 1

## 2019-09-10 MED ORDER — INSULIN ASPART 100 UNIT/ML ~~LOC~~ SOLN
0.0000 [IU] | Freq: Three times a day (TID) | SUBCUTANEOUS | Status: DC
  Administered 2019-09-12: 4 [IU] via SUBCUTANEOUS
  Administered 2019-09-13: 3 [IU] via SUBCUTANEOUS

## 2019-09-10 MED ORDER — INSULIN ASPART 100 UNIT/ML ~~LOC~~ SOLN: 0.0000 [IU] | Freq: Every day | SUBCUTANEOUS | Status: DC

## 2019-09-10 MED ORDER — ONDANSETRON HCL 4 MG PO TABS
4.0000 mg | ORAL_TABLET | Freq: Four times a day (QID) | ORAL | Status: DC | PRN
  Administered 2019-09-12: 4 mg via ORAL
  Filled 2019-09-10: qty 1

## 2019-09-10 MED ORDER — TRAMADOL HCL 50 MG PO TABS
50.0000 mg | ORAL_TABLET | Freq: Three times a day (TID) | ORAL | Status: DC | PRN
  Administered 2019-09-10 – 2019-09-11 (×2): 50 mg via ORAL
  Filled 2019-09-10 (×2): qty 1

## 2019-09-10 MED ORDER — FENTANYL CITRATE (PF) 100 MCG/2ML IJ SOLN
50.0000 ug | Freq: Once | INTRAMUSCULAR | Status: AC
  Administered 2019-09-10: 50 ug via INTRAVENOUS
  Filled 2019-09-10: qty 2

## 2019-09-10 MED ORDER — LACTATED RINGERS IV BOLUS
500.0000 mL | Freq: Once | INTRAVENOUS | Status: AC
  Administered 2019-09-10: 500 mL via INTRAVENOUS

## 2019-09-10 MED ORDER — HYDROCODONE-ACETAMINOPHEN 5-325 MG PO TABS: 1.0000 | ORAL_TABLET | Freq: Once | ORAL | Status: DC

## 2019-09-10 MED ORDER — HYDROCODONE-ACETAMINOPHEN 5-325 MG PO TABS
1.0000 | ORAL_TABLET | Freq: Once | ORAL | Status: AC
  Administered 2019-09-10: 1 via ORAL
  Filled 2019-09-10: qty 1

## 2019-09-10 MED ORDER — ONDANSETRON HCL 4 MG/2ML IJ SOLN
4.0000 mg | Freq: Four times a day (QID) | INTRAMUSCULAR | Status: DC | PRN
  Administered 2019-09-13: 4 mg via INTRAVENOUS
  Filled 2019-09-10: qty 2

## 2019-09-10 NOTE — ED Triage Notes (Signed)
Pt arrives to ED w/ c/o 10/10 back pain after fall today. AOx4.

## 2019-09-10 NOTE — ED Notes (Addendum)
Pt states that he feels lightheaded and he has not taken his blood pressure medication today. This NT rechecked pts vital. BP 105/50, Pulse 89 O2 94% RN stephen notified. Advised to recheck BP in 30 mins

## 2019-09-10 NOTE — ED Provider Notes (Signed)
[facility] EMERGENCY DEPARTMENT Provider Note   CSN: [id number] Arrival date & time: 09/10/19  1401     History Chief Complaint  Patient presents with  . Back Pain    Brian Silva is a 79 y.o. male.  HPI Patient is a 79 year old male with history of morbid obesity, asthma, chronic low back pain, DM 2, previous syncopal episodes, who presents for acute low back pain.  At baseline, patient lives at home alone.  He is able to ambulate around his house with a walker.  Yesterday, he was on the commode when he felt lightheaded and dizzy.  He attempted to stand up in an effort to relieve his dizziness/lightheadedness symptoms.  He subsequently syncopized.  When he woke up on the floor, he was unable to get up.  At baseline, he says he would normally not be able to get up from the floor.  He called EMS who arrived and helped him up into his favorite chair.  They took his blood pressure and found it to be 105/50, lower than baseline.  They offered a ride to the hospital, but he refused.  He was able to get from his chair into his bed, with use of a walker last night.  While laying in bed, he had worsening acute low back pain.  This morning, he was unable to get out of his bed.  He states that the reason he feels like he was not able to get out of the bed was due to the pain, rather than weakness.  He denies current feelings of weakness.  He called EMS due to his inability to get out of bed and his worsening pain.  Upon arrival in the hospital, his initial blood pressure was low (91/71).  He had urinary incontinence shortly after arrival.  He says that this is not normal for him.  He continued to endorse low back pain, that is worsened with movement as well as laying on his back.  He denies any lower extremity paresthesias or sensory loss.  He denies any recent nausea/vomiting/diarrhea.  He does state that he feels hot at times, usually during the dizzy sensations.  He has not had  diminished p.o. intake recently.  He denies any other recent traumas.    Past Medical History:  Diagnosis Date  . Asthma   . Chronic respiratory failure (HCC)    on 2L oxygen  . Hyperlipidemia   . Hypertension   . Obese   . OSA (obstructive sleep apnea)    Sleep study 08/2010 showed mild to moderate obstructive sleep apnea/hypopnea syndrome. However, never initiated CPAP  . Venous stasis    Venous Statis Disease (03/28/2004)    Patient Active Problem List   Diagnosis Date Noted  . Compression fracture of body of thoracic vertebra ([place]) 09/10/2019  . Fall at home, initial encounter 09/10/2019  . AKI (acute kidney injury) ([place]) 01/17/2018  . Syncope, vasovagal 01/17/2018  . Diabetes mellitus type II, non insulin dependent ([place]) 01/17/2018  . Hyponatremia 01/17/2018  . Syncope 01/17/2018  . Depression 10/25/2015  . Hyperlipidemia 10/25/2015  . TINEA PEDIS 01/04/2010  . TINEA VERSICOLOR 01/04/2010  . URI 01/04/2010  . HYPERGLYCEMIA 12/02/2008  . ABDOMINAL WALL HERNIA 12/01/2007  . OBESITY, MORBID 04/04/2007  . SLEEP APNEA, OBSTRUCTIVE 04/04/2007  . LOW BACK PAIN, CHRONIC 12/07/2004  . HYPERLIPIDEMIA 08/12/2002  . ALLERGIC RHINITIS, SEASONAL 08/12/2002  . Essential hypertension 04/01/2000  . Asthma 10/11/1997  . SUPERFICIAL PHLEBITIS 05/06/1990  History reviewed. No pertinent surgical history.     No family history on file.  Social History   Tobacco Use  . Smoking status: Never Smoker  . Smokeless tobacco: Never Used  Substance Use Topics  . Alcohol use: Yes    Comment: occasionally   . Drug use: Never    Home Medications Prior to Admission medications   Medication Sig Start Date End Date Taking? Authorizing Provider  albuterol (PROVENTIL HFA;VENTOLIN HFA) 108 (90 Base) MCG/ACT inhaler Inhale 2 puffs into the lungs every 6 (six) hours as needed. For shortness of breath 10/28/15  Yes Theodis Blaze, MD  albuterol (PROVENTIL) (2.5 MG/3ML) 0.083% nebulizer  solution Take 2.5 mg by nebulization every 6 (six) hours as needed for wheezing or shortness of breath.  08/16/17  Yes [provider]  amLODipine (NORVASC) 10 MG tablet Take 10 mg by mouth daily.  09/03/17  Yes [provider]  aspirin EC 81 MG tablet Take 81 mg by mouth daily.   Yes [provider]  atorvastatin (LIPITOR) 20 MG tablet Take 20 mg by mouth at bedtime.     Yes [provider]  citalopram (CELEXA) 20 MG tablet Take 20 mg by mouth daily. 02/23/19  Yes [provider]  fluticasone (FLONASE) 50 MCG/ACT nasal spray Place 1 spray into both nostrils daily. 05/07/19  Yes [provider]  furosemide (LASIX) 80 MG tablet Take 80 mg by mouth 2 (two) times daily.    Yes [provider]  lisinopril (PRINIVIL,ZESTRIL) 40 MG tablet Take 40 mg by mouth daily.   Yes [provider]  meloxicam (MOBIC) 7.5 MG tablet Take 7.5 mg by mouth daily.  09/28/17  Yes [provider]  metFORMIN (GLUCOPHAGE) 500 MG tablet Take 500 mg by mouth 2 (two) times daily with a meal.  07/14/17  Yes [provider]    Allergies    Patient has no known allergies.  Review of Systems   Review of Systems  Constitutional: Negative for activity change, appetite change, chills, fatigue and fever.  HENT: Negative.  Negative for ear pain and sore throat.   Eyes: Negative.  Negative for pain and visual disturbance.  Respiratory: Positive for shortness of breath (Baseline). Negative for cough and wheezing.   Cardiovascular: Negative for chest pain, palpitations and leg swelling.  Gastrointestinal: Negative for abdominal distention, abdominal pain, constipation, diarrhea, nausea and vomiting.  Genitourinary: Negative for dysuria and hematuria.  Musculoskeletal: Positive for back pain and gait problem. Negative for arthralgias, joint swelling and neck pain.  Skin: Negative for color change and rash.  Neurological: Positive for dizziness,  syncope, weakness (Generalized) and light-headedness. Negative for seizures, numbness and headaches.  Hematological: Negative.  Does not bruise/bleed easily.  Psychiatric/Behavioral: Negative.  Negative for confusion and decreased concentration.  All other systems reviewed and are negative.   Physical Exam Updated Vital Signs BP (!) 142/56 (BP Location: Right Arm)   Pulse 72   Temp 98.6 F (37 C) (Oral)   Resp 14   Ht 5\' 9"  (1.753 m)   SpO2 96%   BMI 55.35 kg/m   Physical Exam Constitutional:      General: He is not in acute distress.    Appearance: He is obese. He is ill-appearing. He is not toxic-appearing or diaphoretic.  HENT:     Right Ear: External ear normal.     Left Ear: External ear normal.     Nose: Nose normal.     Mouth/Throat:  Mouth: Mucous membranes are moist.     Pharynx: Oropharynx is clear.  Eyes:     General: No visual field deficit or scleral icterus.    Extraocular Movements: Extraocular movements intact.  Cardiovascular:     Rate and Rhythm: Normal rate.     Pulses: Normal pulses.  Pulmonary:     Effort: No respiratory distress.     Breath sounds: No wheezing, rhonchi or rales.     Comments: Increased work of breathing Chest:     Chest wall: No tenderness.  Abdominal:     Palpations: Abdomen is soft.     Tenderness: There is no abdominal tenderness. There is no right CVA tenderness, left CVA tenderness or guarding.  Musculoskeletal:        General: Tenderness (Lumbar spine; bilateral calf muscles; right anterior shin) present. No deformity.     Cervical back: Normal range of motion and neck supple. No rigidity or tenderness.  Skin:    General: Skin is warm and dry.  Neurological:     General: No focal deficit present.     Mental Status: He is oriented to person, place, and time.     Cranial Nerves: Cranial nerves are intact. No cranial nerve deficit, dysarthria or facial asymmetry.     Sensory: Sensation is intact. No sensory deficit.      Motor: Motor function is intact. No weakness or abnormal muscle tone.     Coordination: Coordination is intact. Finger-Nose-Finger Test normal.  Psychiatric:        Mood and Affect: Mood normal.        Behavior: Behavior normal.     ED Results / Procedures / Treatments   Labs (all labs ordered are listed, but only abnormal results are displayed) Labs Reviewed  CBC WITH DIFFERENTIAL/PLATELET - Abnormal; Notable for the following components:      Result Value   WBC 13.2 (*)    Hemoglobin 11.2 (*)    HCT 36.0 (*)    Platelets 507 (*)    Neutro Abs 11.8 (*)    Lymphs Abs 0.6 (*)    All other components within normal limits  COMPREHENSIVE METABOLIC PANEL - Abnormal; Notable for the following components:   Glucose, Bld 157 (*)    Creatinine, Ser 1.52 (*)    Albumin 2.9 (*)    GFR calc non Af Amer 44 (*)    GFR calc Af Amer 51 (*)    All other components within normal limits  URINALYSIS, ROUTINE W REFLEX MICROSCOPIC - Abnormal; Notable for the following components:   APPearance HAZY (*)    All other components within normal limits  CK - Abnormal; Notable for the following components:   Total CK 36 (*)    All other components within normal limits  GLUCOSE, CAPILLARY - Abnormal; Notable for the following components:   Glucose-Capillary 125 (*)    All other components within normal limits  I-STAT CHEM 8, ED - Abnormal; Notable for the following components:   Creatinine, Ser 1.60 (*)    Glucose, Bld 153 (*)    Hemoglobin 10.9 (*)    HCT 32.0 (*)    All other components within normal limits  SARS CORONAVIRUS 2 BY RT PCR (HOSPITAL ORDER, [facility])  URINE CULTURE  HEMOGLOBIN A1C  COMPREHENSIVE METABOLIC PANEL  CBC  CBG MONITORING, ED    EKG None  Radiology DG Tibia/Fibula Right  Result Date: 09/10/2019 CLINICAL DATA:  Right lower extremity pain. EXAM:  RIGHT TIBIA AND FIBULA - 2 VIEW COMPARISON:  None. FINDINGS: There is no evidence of fracture  or other focal bone lesions. Vascular calcifications are noted. IMPRESSION: Negative. Electronically Signed   By: Marijo Conception M.D.   On: 09/10/2019 16:42   CT Head Wo Contrast  Result Date: 09/10/2019 CLINICAL DATA:  79 year old male status post fall. Headache and back pain. EXAM: CT HEAD WITHOUT CONTRAST TECHNIQUE: Contiguous axial images were obtained from the base of the skull through the vertex without intravenous contrast. COMPARISON:  None. FINDINGS: Brain: Cerebral volume is within normal limits for age. No midline shift, ventriculomegaly, mass effect, evidence of mass lesion, intracranial hemorrhage or evidence of cortically based acute infarction. Gray-white matter differentiation is within normal limits throughout the brain. Vascular: Calcified atherosclerosis at the skull base. No suspicious intracranial vascular hyperdensity. Skull: No fracture identified. Sinuses/Orbits: Opacified right mastoids. Right tympanic cavity remains clear. Other Visualized paranasal sinuses and mastoids are well pneumatized. Other: Mild posterior right occiput region scalp contusion on series 4, image 26. Underlying calvarium appears intact. No other orbit or scalp soft tissue injury identified. IMPRESSION: 1. Mild posterior right occiput region scalp contusion without underlying skull fracture. 2. Normal for age non contrast CT appearance of the brain. 3. Opacified right mastoid air cells, probably inflammatory. Electronically Signed   By: Genevie Ann M.D.   On: 09/10/2019 19:43   CT Cervical Spine Wo Contrast  Result Date: 09/10/2019 CLINICAL DATA:  79 year old male status post fall. Headache and back pain. EXAM: CT CERVICAL SPINE WITHOUT CONTRAST TECHNIQUE: Multidetector CT imaging of the cervical spine was performed without intravenous contrast. Multiplanar CT image reconstructions were also generated. COMPARISON:  Head CT today reported separately. FINDINGS: Alignment: Straightening of cervical lordosis.  Cervicothoracic junction alignment is within normal limits. Bilateral posterior element alignment is within normal limits. Skull base and vertebrae: Visualized skull base is intact. No atlanto-occipital dissociation. No acute osseous abnormality identified. Soft tissues and spinal canal: No prevertebral fluid or swelling. No visible canal hematoma. Calcified carotid atherosclerosis in the neck with a partially retropharyngeal course of both carotids. Disc levels: Widespread cervical disc and endplate degeneration. There is some ossification of the posterior longitudinal ligament (OPLL). Suspected in the lower cervical spine at C6 (sagittal image 57). At least mild associated spinal stenosis. Upper chest: Visible upper thoracic levels appear intact. Negative lung apices. IMPRESSION: 1. No acute traumatic injury identified in the cervical spine. 2. Widespread cervical disc and endplate degeneration, with ossification of the posterior longitudinal ligament (OPLL) and at least mild spinal stenosis at C6. Electronically Signed   By: Genevie Ann M.D.   On: 09/10/2019 19:48   CT Thoracic Spine Wo Contrast  Result Date: 09/10/2019 CLINICAL DATA:  79 year old male status post fall. Headache and back pain. EXAM: CT THORACIC SPINE WITHOUT CONTRAST TECHNIQUE: Multidetector CT images of the thoracic were obtained using the standard protocol without intravenous contrast. COMPARISON:  Cervical spine CT today reported separately. Chest CT 10/26/2015. FINDINGS: Limited cervical spine imaging:  Reported separately today. Thoracic spine segmentation:  Normal. Alignment:  Stable thoracic kyphosis since 2017. Vertebrae: Several levels of interbody ankylosis via flowing endplate osteophytes, most notably T8-T9. There is subtle compression of the T12 superior endplate which is new since 2017 (series 7, image 61). Subtle endplate cortical fracture lucency is identified. No retropulsed bone or complicating features. No other acute osseous  abnormality identified. Other thoracic vertebrae and visible ribs appear intact. Paraspinal and other soft tissues: Calcified aortic atherosclerosis. Small calcified  left hilar lymph nodes. No pericardial or pleural effusion. Mild left lung base chronic scarring or atelectasis. Negative visible noncontrast upper abdominal viscera. Thoracic paraspinal soft tissues remain within normal limits. Disc levels: Generally mild for age thoracic spine degeneration, not definitely progressed since 2017. IMPRESSION: 1. Mild acute T12 superior endplate compression fracture. No retropulsed bone or complicating features. 2. No other acute traumatic injury identified in the thoracic spine. 3. Aortic Atherosclerosis (ICD10-I70.0). Electronically Signed   By: Genevie Ann M.D.   On: 09/10/2019 19:54   CT Lumbar Spine Wo Contrast  Result Date: 09/10/2019 CLINICAL DATA:  79 year old male status post fall. Headache and back pain. EXAM: CT LUMBAR SPINE WITHOUT CONTRAST TECHNIQUE: Multidetector CT imaging of the lumbar spine was performed without intravenous contrast administration. Multiplanar CT image reconstructions were also generated. COMPARISON:  Thoracic spine CT today reported separately. CT Abdomen and Pelvis 10/06/2017. FINDINGS: Segmentation: Normal, concordant with the thoracic spine numbering today. Alignment: Straightening of lumbar lordosis is stable since 2019. There is subtle chronic anterolisthesis of L5 on S1. Vertebrae: Mild T12 superior endplate compression fracture again noted. There are chronic bilateral L5 pars fractures, unchanged since 2019. The L1 through L4 levels appears stable and intact. Visible sacrum and SI joints appear intact. Paraspinal and other soft tissues: Calcified aortic atherosclerosis. Stable visible noncontrast abdominal and pelvic viscera. Diverticulosis of the sigmoid colon. Negative lumbar paraspinal soft tissues. Disc levels: Multilevel advanced lumbar disc degeneration with vacuum disc.  Multifactorial degenerative lumbar spinal stenosis appears maximal at L3-L4 and stable since 2019. IMPRESSION: 1. No acute traumatic injury identified in the lumbar spine. Chronic bilateral L5 pars fractures. 2. Chronically advanced lumbar spine degeneration stable since the 2019 CT. Electronically Signed   By: Genevie Ann M.D.   On: 09/10/2019 19:59   DG Chest Portable 1 View  Result Date: 09/10/2019 CLINICAL DATA:  Syncope. EXAM: PORTABLE CHEST 1 VIEW COMPARISON:  June 02, 2019 FINDINGS: There is no evidence of acute infiltrate, pleural effusion or pneumothorax. The cardiac silhouette is moderately enlarged and unchanged in size. There is mild calcification of the aortic arch. The visualized skeletal structures are unremarkable. IMPRESSION: No active disease. Electronically Signed   By: Virgina Norfolk M.D.   On: 09/10/2019 16:43    Procedures Procedures (including critical care time)  Medications Ordered in ED Medications  insulin aspart (novoLOG) injection 0-20 Units (has no administration in time range)  insulin aspart (novoLOG) injection 0-5 Units (0 Units Subcutaneous Not Given 09/10/19 2330)  enoxaparin (LOVENOX) injection 40 mg (has no administration in time range)  0.9 %  sodium chloride infusion ( Intravenous Stopped 09/11/19 0120)  traMADol (ULTRAM) tablet 50 mg (50 mg Oral Given 09/10/19 2354)  morphine 2 MG/ML injection 2 mg (has no administration in time range)  ondansetron (ZOFRAN) tablet 4 mg (has no administration in time range)    Or  ondansetron (ZOFRAN) injection 4 mg (has no administration in time range)  lactated ringers bolus 500 mL (0 mLs Intravenous Stopped 09/10/19 2201)  fentaNYL (SUBLIMAZE) injection 50 mcg (50 mcg Intravenous Given 09/10/19 1739)  HYDROcodone-acetaminophen (NORCO/VICODIN) 5-325 MG per tablet 1 tablet (1 tablet Oral Given 09/10/19 2014)    ED Course  I have reviewed the triage vital signs and the nursing notes.  Pertinent labs & imaging results  that were available during my care of the patient were reviewed by me and considered in my medical decision making (see chart for details).    MDM Rules/Calculators/A&P  Patient is a 79 year old male who presents for acute lower back pain.  Patient lives at home alone and ambulates throughout the house with a walker.  Yesterday, he had a syncopal fall when getting up off of the commode.  He required EMS to get him off the floor and into a chair.  Since the fall, he has had severe lower back pain, that further limits his mobility.  He was able to get into a bed last night, but was unable to get out of the bed due to his chronic weakness and obesity in addition to his acute lower back pain.  Upon arrival in the ED, patient had an initial blood pressure reading of 91/71.  Subsequent blood pressure readings normalized.  Other vital signs were normal.  Patient is not in acute distress.  He does have increased work of breathing, that he says is baseline for him.  On exam, he has tenderness in the area of L1.  He has no focal neurologic deficits.  He does have tenderness in his bilateral calf muscles, in addition to his right anterior distal lower leg.  Given his syncopal fall and subsequent lower back pain, CT imaging of head and spine was ordered.  Given his anterior right leg tenderness, right tib-fib x-ray was ordered.  Given his chronic illnesses, and what a appears to be acute on chronic generalized weakness, laboratory work-up was ordered as well.  Fentanyl was ordered for analgesia.  Labs were notable for leukocytosis (13.2), consistent with recent trauma; baseline anemia (hemoglobin of 11.2); baseline creatinine of 1.52; hypoalbuminemia (2.9); normal CK of 36.  Imaging showed mild acute T12 superior endplate compression fracture without retropulsion.  Imaging was otherwise negative for acute injuries.  Norco was ordered for additional analgesia.  Patient lives at home alone and,  at baseline, has poor mobility.  Given his acute T12 injury, and significant associated pain, he will require hospital admission.  Patient was admitted to hospitalist service.  Final Clinical Impression(s) / ED Diagnoses Final diagnoses:  Acute midline low back pain without sciatica  Compression fracture of T12 vertebra, initial encounter [facility])    Rx / [place] Orders ED Discharge Orders    None       Godfrey Pick, MD 09/11/19 [place], Wenda Overland, MD 09/12/19 1102

## 2019-09-10 NOTE — H&P (Signed)
History and Physical   Brian Silva FIE:[id number] DOB: 26-Nov-1940 DOA: 09/10/2019  Referring MD/NP/PA: Dr. Ralene Bathe  PCP: Benito Mccreedy, MD   Outpatient Specialists: None  Patient coming from: Home  Chief Complaint: Fall with back pain  HPI: Brian Silva is a 79 y.o. male with medical history significant of morbid obesity, chronic respiratory failure on home O2, asthma, hyperlipidemia, hypertension, obstructive sleep apnea, diabetes and history of syncopal episodes who presented today after having a fall at home with low back pain.  Patient uses a walker to ambulate around the house.  He was apparently on his commode when he felt lightheaded dizzy.  He was trying to stand up but subsequently he had a syncopal episode.  When patient woke up he was on the floor.  EMS called and brought the patient to the ER where his blood pressure was 105/50.  He initially was refusing to come to the hospital but due to worsening back pain he decided to come in.  The fall happened yesterday and he stayed till today.  Today however blood pressure has dropped into the 90/70.  He was seen in the ER in severe pain with urinary incontinence.  Pain is rated as 9 out of 10.  Worse with activities with any movement.  Not relieved by much except initial pain medicine.  Patient is therefore being admitted to the hospital for further work-up..  ED Course: Temperature is 98.5 blood pressure 142/69 pulse 94 respirate 22 oxygen sats 91% room air.  Chemistry largely stable except for creatinine of 1.52.  Albumin 2.9.  Hemoglobin 11.2 white count 13.2 and platelets 507.  Urinalysis is negative.  CT cervical spine head lumbar and thoracic spine done.  Significant finding is T12 compression fracture.  Patient is being admitted for pain management and management of his compression fracture.  Review of Systems: As per HPI otherwise 10 point review of systems negative.    Past Medical History:  Diagnosis Date  . Asthma     . Chronic respiratory failure (HCC)    on 2L oxygen  . Hyperlipidemia   . Hypertension   . Obese   . OSA (obstructive sleep apnea)    Sleep study 08/2010 showed mild to moderate obstructive sleep apnea/hypopnea syndrome. However, never initiated CPAP  . Venous stasis    Venous Statis Disease (03/28/2004)    History reviewed. No pertinent surgical history.   reports that he has never smoked. He has never used smokeless tobacco. He reports current alcohol use. He reports that he does not use drugs.  No Known Allergies  No family history on file.   Prior to Admission medications   Medication Sig Start Date End Date Taking? Authorizing Provider  albuterol (PROVENTIL HFA;VENTOLIN HFA) 108 (90 Base) MCG/ACT inhaler Inhale 2 puffs into the lungs every 6 (six) hours as needed. For shortness of breath 10/28/15   Theodis Blaze, MD  albuterol (PROVENTIL) (2.5 MG/3ML) 0.083% nebulizer solution Take 2.5 mg by nebulization every 6 (six) hours as needed for wheezing or shortness of breath.  08/16/17   [provider]  amLODipine (NORVASC) 10 MG tablet Take 10 mg by mouth daily.  09/03/17   [provider]  aspirin EC 81 MG tablet Take 81 mg by mouth daily.    [provider]  atorvastatin (LIPITOR) 20 MG tablet Take 20 mg by mouth at bedtime.      [provider]  citalopram (CELEXA) 20 MG tablet Take 20 mg by mouth  daily. 02/23/19   [provider]  fluticasone (FLONASE) 50 MCG/ACT nasal spray Place 1 spray into both nostrils daily. 05/07/19   [provider]  furosemide (LASIX) 80 MG tablet Take 80 mg by mouth 2 (two) times daily.     [provider]  lisinopril (PRINIVIL,ZESTRIL) 40 MG tablet Take 40 mg by mouth daily.    [provider]  meloxicam (MOBIC) 7.5 MG tablet Take 7.5 mg by mouth daily.  09/28/17   [provider]  metFORMIN (GLUCOPHAGE) 500 MG tablet Take 500 mg by mouth 2 (two) times daily with a meal.   07/14/17   [provider]    Physical Exam: Vitals:   09/10/19 1456 09/10/19 1525 09/10/19 1745 09/10/19 1945  BP: (!) 105/50 91/71 (!) 118/55 125/63  Pulse: 89 94 81 73  Resp: 20 (!) 22 17 17   Temp:  (!) 97.5 F (36.4 C)    TempSrc:  Oral    SpO2: 94% 95% 91% 95%  Height:          Constitutional: Morbidly obese, no distress Vitals:   09/10/19 1456 09/10/19 1525 09/10/19 1745 09/10/19 1945  BP: (!) 105/50 91/71 (!) 118/55 125/63  Pulse: 89 94 81 73  Resp: 20 (!) 22 17 17   Temp:  (!) 97.5 F (36.4 C)    TempSrc:  Oral    SpO2: 94% 95% 91% 95%  Height:       Eyes: PERRL, lids and conjunctivae normal ENMT: Mucous membranes are moist. Posterior pharynx clear of any exudate or lesions.Normal dentition.  Neck: normal, supple, no masses, no thyromegaly Respiratory: clear to auscultation bilaterally, no wheezing, no crackles. Normal respiratory effort. No accessory muscle use.  Cardiovascular: Regular rate and rhythm, no murmurs / rubs / gallops. No extremity edema. 2+ pedal pulses. No carotid bruits.  Abdomen: no tenderness, no masses palpated. No hepatosplenomegaly. Bowel sounds positive.  Musculoskeletal: no clubbing / cyanosis. No joint deformity upper and lower extremities.  Decreased range of motion bilaterally of the hip joints, no contractures. Normal muscle tone.  Skin: no rashes, lesions, ulcers. No induration Neurologic: CN 2-12 grossly intact. Sensation intact, DTR normal. Strength 5/5 in all 4.  Positive SLR Psychiatric: Normal judgment and insight. Alert and oriented x 3. Normal mood.     Labs on Admission: I have personally reviewed following labs and imaging studies  CBC: Recent Labs  Lab 09/10/19 1710 09/10/19 1724  WBC 13.2*  --   NEUTROABS 11.8*  --   HGB 11.2* 10.9*  HCT 36.0* 32.0*  MCV 83.9  --   PLT 507*  --    Basic Metabolic Panel: Recent Labs  Lab 09/10/19 1710 09/10/19 1724  NA 139 138  K 4.2 4.0  CL 100 104  CO2 25  --    GLUCOSE 157* 153*  BUN 20 22  CREATININE 1.52* 1.60*  CALCIUM 9.4  --    GFR: CrCl cannot be calculated (Unknown ideal weight.). Liver Function Tests: Recent Labs  Lab 09/10/19 1710  AST 16  ALT 13  ALKPHOS 98  BILITOT 0.8  PROT 6.9  ALBUMIN 2.9*   No results for input(s): LIPASE, AMYLASE in the last 168 hours. No results for input(s): AMMONIA in the last 168 hours. Coagulation Profile: No results for input(s): INR, PROTIME in the last 168 hours. Cardiac Enzymes: Recent Labs  Lab 09/10/19 1710  CKTOTAL 36*   BNP (last 3 results) No results for input(s): PROBNP in the last 8760 hours. HbA1C:  No results for input(s): HGBA1C in the last 72 hours. CBG: No results for input(s): GLUCAP in the last 168 hours. Lipid Profile: No results for input(s): CHOL, HDL, LDLCALC, TRIG, CHOLHDL, LDLDIRECT in the last 72 hours. Thyroid Function Tests: No results for input(s): TSH, T4TOTAL, FREET4, T3FREE, THYROIDAB in the last 72 hours. Anemia Panel: No results for input(s): VITAMINB12, FOLATE, FERRITIN, TIBC, IRON, RETICCTPCT in the last 72 hours. Urine analysis:    Component Value Date/Time   COLORURINE YELLOW 06/03/2019 0140   APPEARANCEUR HAZY (A) 06/03/2019 0140   LABSPEC 1.014 06/03/2019 0140   PHURINE 6.0 06/03/2019 0140   GLUCOSEU 50 (A) 06/03/2019 0140   HGBUR SMALL (A) 06/03/2019 0140   HGBUR negative 01/04/2010 1409   BILIRUBINUR NEGATIVE 06/03/2019 0140   KETONESUR NEGATIVE 06/03/2019 0140   PROTEINUR 30 (A) 06/03/2019 0140   UROBILINOGEN 0.2 03/30/2012 2227   NITRITE NEGATIVE 06/03/2019 0140   LEUKOCYTESUR NEGATIVE 06/03/2019 0140   Sepsis Labs: @LABRCNTIP (procalcitonin:4,lacticidven:4) ) Recent Results (from the past 240 hour(s))  SARS Coronavirus 2 by RT PCR (hospital order, performed in [place] hospital lab) Nasopharyngeal Nasopharyngeal Swab     Status: None   Collection Time: 09/10/19  6:06 PM   Specimen: Nasopharyngeal Swab  Result Value Ref Range  Status   SARS Coronavirus 2 NEGATIVE NEGATIVE Final    Comment: (NOTE) SARS-CoV-2 target nucleic acids are NOT DETECTED. The SARS-CoV-2 RNA is generally detectable in upper and lower respiratory specimens during the acute phase of infection. The lowest concentration of SARS-CoV-2 viral copies this assay can detect is 250 copies / mL. A negative result does not preclude SARS-CoV-2 infection and should not be used as the sole basis for treatment or other patient management decisions.  A negative result may occur with improper specimen collection / handling, submission of specimen other than nasopharyngeal swab, presence of viral mutation(s) within the areas targeted by this assay, and inadequate number of viral copies (<250 copies / mL). A negative result must be combined with clinical observations, patient history, and epidemiological information. Fact Sheet for Patients:   StrictlyIdeas.no Fact Sheet for Healthcare Providers: BankingDealers.co.za This test is not yet approved or cleared  by the [place] FDA and has been authorized for detection and/or diagnosis of SARS-CoV-2 by FDA under an Emergency Use Authorization (EUA).  This EUA will remain in effect (meaning this test can be used) for the duration of the COVID-19 declaration under Section 564(b)(1) of the Act, 21 U.S.C. section 360bbb-3(b)(1), unless the authorization is terminated or revoked sooner. Performed at [facility], [place] [address], [place], [place] [address]      Radiological Exams on Admission: DG Tibia/Fibula Right  Result Date: 09/10/2019 CLINICAL DATA:  Right lower extremity pain. EXAM: RIGHT TIBIA AND FIBULA - 2 VIEW COMPARISON:  None. FINDINGS: There is no evidence of fracture or other focal bone lesions. Vascular calcifications are noted. IMPRESSION: Negative. Electronically Signed   By: Marijo Conception M.D.   On: 09/10/2019 16:42   CT Head Wo  Contrast  Result Date: 09/10/2019 CLINICAL DATA:  79 year old male status post fall. Headache and back pain. EXAM: CT HEAD WITHOUT CONTRAST TECHNIQUE: Contiguous axial images were obtained from the base of the skull through the vertex without intravenous contrast. COMPARISON:  None. FINDINGS: Brain: Cerebral volume is within normal limits for age. No midline shift, ventriculomegaly, mass effect, evidence of mass lesion, intracranial hemorrhage or evidence of cortically based acute infarction. Gray-white matter differentiation is within normal limits throughout  the brain. Vascular: Calcified atherosclerosis at the skull base. No suspicious intracranial vascular hyperdensity. Skull: No fracture identified. Sinuses/Orbits: Opacified right mastoids. Right tympanic cavity remains clear. Other Visualized paranasal sinuses and mastoids are well pneumatized. Other: Mild posterior right occiput region scalp contusion on series 4, image 26. Underlying calvarium appears intact. No other orbit or scalp soft tissue injury identified. IMPRESSION: 1. Mild posterior right occiput region scalp contusion without underlying skull fracture. 2. Normal for age non contrast CT appearance of the brain. 3. Opacified right mastoid air cells, probably inflammatory. Electronically Signed   By: Genevie Ann M.D.   On: 09/10/2019 19:43   CT Cervical Spine Wo Contrast  Result Date: 09/10/2019 CLINICAL DATA:  79 year old male status post fall. Headache and back pain. EXAM: CT CERVICAL SPINE WITHOUT CONTRAST TECHNIQUE: Multidetector CT imaging of the cervical spine was performed without intravenous contrast. Multiplanar CT image reconstructions were also generated. COMPARISON:  Head CT today reported separately. FINDINGS: Alignment: Straightening of cervical lordosis. Cervicothoracic junction alignment is within normal limits. Bilateral posterior element alignment is within normal limits. Skull base and vertebrae: Visualized skull base is intact.  No atlanto-occipital dissociation. No acute osseous abnormality identified. Soft tissues and spinal canal: No prevertebral fluid or swelling. No visible canal hematoma. Calcified carotid atherosclerosis in the neck with a partially retropharyngeal course of both carotids. Disc levels: Widespread cervical disc and endplate degeneration. There is some ossification of the posterior longitudinal ligament (OPLL). Suspected in the lower cervical spine at C6 (sagittal image 57). At least mild associated spinal stenosis. Upper chest: Visible upper thoracic levels appear intact. Negative lung apices. IMPRESSION: 1. No acute traumatic injury identified in the cervical spine. 2. Widespread cervical disc and endplate degeneration, with ossification of the posterior longitudinal ligament (OPLL) and at least mild spinal stenosis at C6. Electronically Signed   By: Genevie Ann M.D.   On: 09/10/2019 19:48   CT Thoracic Spine Wo Contrast  Result Date: 09/10/2019 CLINICAL DATA:  79 year old male status post fall. Headache and back pain. EXAM: CT THORACIC SPINE WITHOUT CONTRAST TECHNIQUE: Multidetector CT images of the thoracic were obtained using the standard protocol without intravenous contrast. COMPARISON:  Cervical spine CT today reported separately. Chest CT 10/26/2015. FINDINGS: Limited cervical spine imaging:  Reported separately today. Thoracic spine segmentation:  Normal. Alignment:  Stable thoracic kyphosis since 2017. Vertebrae: Several levels of interbody ankylosis via flowing endplate osteophytes, most notably T8-T9. There is subtle compression of the T12 superior endplate which is new since 2017 (series 7, image 61). Subtle endplate cortical fracture lucency is identified. No retropulsed bone or complicating features. No other acute osseous abnormality identified. Other thoracic vertebrae and visible ribs appear intact. Paraspinal and other soft tissues: Calcified aortic atherosclerosis. Small calcified left hilar lymph  nodes. No pericardial or pleural effusion. Mild left lung base chronic scarring or atelectasis. Negative visible noncontrast upper abdominal viscera. Thoracic paraspinal soft tissues remain within normal limits. Disc levels: Generally mild for age thoracic spine degeneration, not definitely progressed since 2017. IMPRESSION: 1. Mild acute T12 superior endplate compression fracture. No retropulsed bone or complicating features. 2. No other acute traumatic injury identified in the thoracic spine. 3. Aortic Atherosclerosis (ICD10-I70.0). Electronically Signed   By: Genevie Ann M.D.   On: 09/10/2019 19:54   CT Lumbar Spine Wo Contrast  Result Date: 09/10/2019 CLINICAL DATA:  79 year old male status post fall. Headache and back pain. EXAM: CT LUMBAR SPINE WITHOUT CONTRAST TECHNIQUE: Multidetector CT imaging of the lumbar spine was performed without  intravenous contrast administration. Multiplanar CT image reconstructions were also generated. COMPARISON:  Thoracic spine CT today reported separately. CT Abdomen and Pelvis 10/06/2017. FINDINGS: Segmentation: Normal, concordant with the thoracic spine numbering today. Alignment: Straightening of lumbar lordosis is stable since 2019. There is subtle chronic anterolisthesis of L5 on S1. Vertebrae: Mild T12 superior endplate compression fracture again noted. There are chronic bilateral L5 pars fractures, unchanged since 2019. The L1 through L4 levels appears stable and intact. Visible sacrum and SI joints appear intact. Paraspinal and other soft tissues: Calcified aortic atherosclerosis. Stable visible noncontrast abdominal and pelvic viscera. Diverticulosis of the sigmoid colon. Negative lumbar paraspinal soft tissues. Disc levels: Multilevel advanced lumbar disc degeneration with vacuum disc. Multifactorial degenerative lumbar spinal stenosis appears maximal at L3-L4 and stable since 2019. IMPRESSION: 1. No acute traumatic injury identified in the lumbar spine. Chronic  bilateral L5 pars fractures. 2. Chronically advanced lumbar spine degeneration stable since the 2019 CT. Electronically Signed   By: Genevie Ann M.D.   On: 09/10/2019 19:59   DG Chest Portable 1 View  Result Date: 09/10/2019 CLINICAL DATA:  Syncope. EXAM: PORTABLE CHEST 1 VIEW COMPARISON:  June 02, 2019 FINDINGS: There is no evidence of acute infiltrate, pleural effusion or pneumothorax. The cardiac silhouette is moderately enlarged and unchanged in size. There is mild calcification of the aortic arch. The visualized skeletal structures are unremarkable. IMPRESSION: No active disease. Electronically Signed   By: Virgina Norfolk M.D.   On: 09/10/2019 16:43      Assessment/Plan Principal Problem:   Compression fracture of body of thoracic vertebra (HCC) Active Problems:   OBESITY, MORBID   SLEEP APNEA, OBSTRUCTIVE   Essential hypertension   Hyperlipidemia   Diabetes mellitus type II, non insulin dependent ([place])   Fall at home, initial encounter     #1 T12 compression fracture of the spine: Patient will be admitted for pain management.  PT and OT consultation.  Possibly Ortho or neurosurgical consultation.  He might need TLSO brace.  Disposition once pain is fully controlled.  #2 status post fall: Appears to have syncopized.  Patient came to have recurrent syncope probably vasovagal.  PT and OT consultation.  May consider echocardiogram.  His last echo in the system was from 2019.  #3 obstructive sleep apnea: Continue CPAP from home.  #4 diabetes: Sliding scale insulin with home regimen  #5 morbid obesity: Dietary counseling and supportive care.  #6 essential hypertension: Continue blood pressure control.   DVT prophylaxis: Lovenox Code Status: Full code Family Communication: No family at bedside Disposition Plan: To be determined Consults called: None Admission status: Inpatient  Severity of Illness: The appropriate patient status for this patient is INPATIENT. Inpatient  status is judged to be reasonable and necessary in order to provide the required intensity of service to ensure the patient's safety. The patient's presenting symptoms, physical exam findings, and initial radiographic and laboratory data in the context of their chronic comorbidities is felt to place them at high risk for further clinical deterioration. Furthermore, it is not anticipated that the patient will be medically stable for discharge from the hospital within 2 midnights of admission. The following factors support the patient status of inpatient.   " The patient's presenting symptoms include pain and inability to move. " The worrisome physical exam findings include morbid obesity with decreased range of motion of the legs. " The initial radiographic and laboratory data are worrisome because of CT showing L1 compression fracture. " The chronic co-morbidities include diabetes  and a sleep apnea.   * I certify that at the point of admission it is my clinical judgment that the patient will require inpatient hospital care spanning beyond 2 midnights from the point of admission due to high intensity of service, high risk for further deterioration and high frequency of surveillance required.Barbette Merino MD [facility] Pager [phone]  If 7PM-7AM, please contact night-coverage [email or web address] Password [facility]  09/10/2019, 9:07 PM

## 2019-09-11 ENCOUNTER — Other Ambulatory Visit: Payer: Self-pay

## 2019-09-11 ENCOUNTER — Inpatient Hospital Stay (HOSPITAL_COMMUNITY): Payer: Medicare Other

## 2019-09-11 ENCOUNTER — Other Ambulatory Visit (HOSPITAL_COMMUNITY): Payer: Medicare Other

## 2019-09-11 ENCOUNTER — Encounter (HOSPITAL_COMMUNITY): Payer: Self-pay | Admitting: Internal Medicine

## 2019-09-11 DIAGNOSIS — R55 Syncope and collapse: Secondary | ICD-10-CM

## 2019-09-11 DIAGNOSIS — Y92009 Unspecified place in unspecified non-institutional (private) residence as the place of occurrence of the external cause: Secondary | ICD-10-CM

## 2019-09-11 DIAGNOSIS — I951 Orthostatic hypotension: Principal | ICD-10-CM

## 2019-09-11 DIAGNOSIS — Z7189 Other specified counseling: Secondary | ICD-10-CM

## 2019-09-11 DIAGNOSIS — Z6841 Body Mass Index (BMI) 40.0 and over, adult: Secondary | ICD-10-CM

## 2019-09-11 DIAGNOSIS — I10 Essential (primary) hypertension: Secondary | ICD-10-CM

## 2019-09-11 DIAGNOSIS — R5381 Other malaise: Secondary | ICD-10-CM

## 2019-09-11 DIAGNOSIS — E785 Hyperlipidemia, unspecified: Secondary | ICD-10-CM

## 2019-09-11 DIAGNOSIS — E119 Type 2 diabetes mellitus without complications: Secondary | ICD-10-CM

## 2019-09-11 DIAGNOSIS — W19XXXA Unspecified fall, initial encounter: Secondary | ICD-10-CM

## 2019-09-11 LAB — CBC
HCT: 33.4 % — ABNORMAL LOW (ref 39.0–52.0)
Hemoglobin: 10.5 g/dL — ABNORMAL LOW (ref 13.0–17.0)
MCH: 26.1 pg (ref 26.0–34.0)
MCHC: 31.4 g/dL (ref 30.0–36.0)
MCV: 82.9 fL (ref 80.0–100.0)
Platelets: 454 10*3/uL — ABNORMAL HIGH (ref 150–400)
RBC: 4.03 MIL/uL — ABNORMAL LOW (ref 4.22–5.81)
RDW: 15.3 % (ref 11.5–15.5)
WBC: 9.7 10*3/uL (ref 4.0–10.5)
nRBC: 0 % (ref 0.0–0.2)

## 2019-09-11 LAB — COMPREHENSIVE METABOLIC PANEL WITH GFR
ALT: 12 U/L (ref 0–44)
AST: 12 U/L — ABNORMAL LOW (ref 15–41)
Albumin: 2.6 g/dL — ABNORMAL LOW (ref 3.5–5.0)
Alkaline Phosphatase: 86 U/L (ref 38–126)
Anion gap: 10 (ref 5–15)
BUN: 20 mg/dL (ref 8–23)
CO2: 27 mmol/L (ref 22–32)
Calcium: 8.9 mg/dL (ref 8.9–10.3)
Chloride: 101 mmol/L (ref 98–111)
Creatinine, Ser: 1.39 mg/dL — ABNORMAL HIGH (ref 0.61–1.24)
GFR calc Af Amer: 57 mL/min — ABNORMAL LOW
GFR calc non Af Amer: 49 mL/min — ABNORMAL LOW
Glucose, Bld: 111 mg/dL — ABNORMAL HIGH (ref 70–99)
Potassium: 3.5 mmol/L (ref 3.5–5.1)
Sodium: 138 mmol/L (ref 135–145)
Total Bilirubin: 0.9 mg/dL (ref 0.3–1.2)
Total Protein: 6.3 g/dL — ABNORMAL LOW (ref 6.5–8.1)

## 2019-09-11 LAB — GLUCOSE, CAPILLARY
Glucose-Capillary: 105 mg/dL — ABNORMAL HIGH (ref 70–99)
Glucose-Capillary: 104 mg/dL — ABNORMAL HIGH (ref 70–99)
Glucose-Capillary: 112 mg/dL — ABNORMAL HIGH (ref 70–99)
Glucose-Capillary: 149 mg/dL — ABNORMAL HIGH (ref 70–99)

## 2019-09-11 LAB — HEMOGLOBIN A1C
Hgb A1c MFr Bld: 6.4 % — ABNORMAL HIGH (ref 4.8–5.6)
Mean Plasma Glucose: 136.98 mg/dL

## 2019-09-11 LAB — URINE CULTURE: Culture: NO GROWTH

## 2019-09-11 LAB — ECHOCARDIOGRAM LIMITED: Height: 69 in

## 2019-09-11 MED ORDER — PERFLUTREN LIPID MICROSPHERE
1.0000 mL | INTRAVENOUS | Status: AC | PRN
  Administered 2019-09-11: 5 mL via INTRAVENOUS
  Filled 2019-09-11: qty 10

## 2019-09-11 MED ORDER — TRAMADOL HCL 50 MG PO TABS
50.0000 mg | ORAL_TABLET | Freq: Three times a day (TID) | ORAL | Status: DC | PRN
  Administered 2019-09-12 – 2019-09-14 (×6): 50 mg via ORAL
  Filled 2019-09-11 (×7): qty 1

## 2019-09-11 MED ORDER — ACETAMINOPHEN 500 MG PO TABS
1000.0000 mg | ORAL_TABLET | Freq: Three times a day (TID) | ORAL | Status: DC
  Administered 2019-09-11 – 2019-09-14 (×9): 1000 mg via ORAL
  Filled 2019-09-11 (×9): qty 2

## 2019-09-11 MED ORDER — MORPHINE SULFATE (PF) 4 MG/ML IV SOLN
4.0000 mg | INTRAVENOUS | Status: DC | PRN
  Administered 2019-09-11 – 2019-09-13 (×7): 4 mg via INTRAVENOUS
  Filled 2019-09-11 (×8): qty 1

## 2019-09-11 NOTE — Progress Notes (Signed)
PROGRESS NOTE  Brian Silva PYK:[id number] DOB: Jul 05, 1940   PCP: Benito Mccreedy, MD  Patient is from: Home. Lives alone. Uses walker at baseline.  DOA: 09/10/2019 LOS: 1  Brief Narrative / Interim history: 79 year old male with history of morbid obesity, chronic RF on oxygen, OSA, HTN, DM-2 and syncope presenting with lower back pain after syncopal fall at home. Patient felt lightheaded when he got up off his commode and fell on the floor. Not sure how long he was on the ground. When he woke up, he called EMS. When EMS arrived, hypotensive to 105/50. He reports uncontrolled hypertension with labile blood pressure. Blood pressure drops when he takes his medications, and elevated without meds.  In ED, CT head/cervical spine/thoracic/lumbar done. Mild T12 compression fracture noted. Admitted for pain control, therapy evaluation and syncope.  Subjective: Seen and examined earlier this morning. No major events overnight of this morning. Reports 7/10 back pain at rest. Denies new numbness, tingling or weakness in his legs. Denies bowel or bladder issue. Denies chest pain, dyspnea, palpitation, GI or UTI symptoms.  Objective: Vitals:   09/10/19 2217 09/10/19 2259 09/11/19 0456 09/11/19 0756  BP: (!) 118/49 (!) 121/45 (!) 142/56 (!) 111/51  Pulse: 72 71 72 69  Resp: 16 14 14 14   Temp:  98.5 F (36.9 C) 98.6 F (37 C) 98.2 F (36.8 C)  TempSrc:  Oral Oral Oral  SpO2: 92% 96% 96% 90%  Height:        Intake/Output Summary (Last 24 hours) at 09/11/2019 1459 Last data filed at 09/11/2019 1223 Gross per 24 hour  Intake 523.61 ml  Output 275 ml  Net 248.61 ml   There were no vitals filed for this visit.  Examination:  GENERAL: No apparent distress.  Nontoxic. HEENT: MMM.  Vision and hearing grossly intact.  NECK: Supple.  No apparent JVD.  RESP:  No IWOB.  Fair aeration bilaterally. CVS:  RRR. Heart sounds normal.  ABD/GI/GU: BS+. Abd soft, NTND.  MSK/EXT:  Moves extremities.  No apparent deformity. Trace edema/venous insufficiency. SKIN: Chronic venous insufficiency NEURO: Awake, alert and oriented appropriately.  No apparent focal neuro deficit. PSYCH: Calm. Normal affect.  Procedures:  None  Microbiology summarized: COVID-19 PCR negative.  Assessment & Plan: Syncope-history suggestive for orthostatic hypotension. Could have autonomic dysregulation in the setting of diabetes. -Orthostatic vitals every shift -Treat BP for standing blood pressures -Hold home antihypertensive medications given soft blood pressures. -Apply TED hose. -Check TSH and a.m. cortisol.  Syncopal fall at home T12 compression fracture -Pain control with Tylenol, tramadol, oxycodone and morphine based on pain scale -Bowel regimen -Fall precautions   Obstructive sleep apnea -Continue nightly CPAP  DM-2: On Metformin at home. Recent Labs    09/10/19 2324 09/11/19 0826 09/11/19 1215  GLUCAP 125* 105* 104*  -Continue SSI and statin. -Check hemoglobin A1c.   Morbid obesity:Body mass index is 55.35 kg/m. -Recommend lifestyle change to lose weight  Essential hypertension with labile blood pressures-soft blood pressures this morning -Continue holding BP meds. -We will treat for standing blood pressure -Continue IV fluid  Debility/physical deconditioning: Lives alone. Uses walker at baseline. -PT/OT eval  Goal of care: Patient with significant comorbidities as above. Discussed about CODE STATUS, and the pros and cons of CPR and intubation. I recommended against CPR or intubation in case of emergency but patient wishes to remain full code.          DVT prophylaxis: Subcu Lovenox Code Status: Full code Family Communication: Patient states  he has no family member to update Status is: Inpatient  Remains inpatient appropriate because:Hemodynamically unstable, Unsafe d/c plan and IV treatments appropriate due to intensity of illness or inability to take PO   Dispo:  The patient is from: Home              Anticipated d/c is to: SNF              Anticipated d/c date is: 2 days              Patient currently is not medically stable to d/c.        Consultants:  None   Sch Meds:  Scheduled Meds: . enoxaparin (LOVENOX) injection  40 mg Subcutaneous Daily  . insulin aspart  0-20 Units Subcutaneous TID WC  . insulin aspart  0-5 Units Subcutaneous QHS   Continuous Infusions: . sodium chloride Stopped (09/11/19 0120)   PRN Meds:.morphine injection, ondansetron **OR** ondansetron (ZOFRAN) IV, traMADol  Antimicrobials: Anti-infectives (From admission, onward)   None       I have personally reviewed the following labs and images: CBC: Recent Labs  Lab 09/10/19 1710 09/10/19 1724 09/11/19 0601  WBC 13.2*  --  9.7  NEUTROABS 11.8*  --   --   HGB 11.2* 10.9* 10.5*  HCT 36.0* 32.0* 33.4*  MCV 83.9  --  82.9  PLT 507*  --  454*   BMP &GFR Recent Labs  Lab 09/10/19 1710 09/10/19 1724 09/11/19 0601  NA 139 138 138  K 4.2 4.0 3.5  CL 100 104 101  CO2 25  --  27  GLUCOSE 157* 153* 111*  BUN 20 22 20   CREATININE 1.52* 1.60* 1.39*  CALCIUM 9.4  --  8.9   CrCl cannot be calculated (Unknown ideal weight.). Liver & Pancreas: Recent Labs  Lab 09/10/19 1710 09/11/19 0601  AST 16 12*  ALT 13 12  ALKPHOS 98 86  BILITOT 0.8 0.9  PROT 6.9 6.3*  ALBUMIN 2.9* 2.6*   No results for input(s): LIPASE, AMYLASE in the last 168 hours. No results for input(s): AMMONIA in the last 168 hours. Diabetic: Recent Labs    09/11/19 0601  HGBA1C 6.4*   Recent Labs  Lab 09/10/19 2324 09/11/19 0826 09/11/19 1215  GLUCAP 125* 105* 104*   Cardiac Enzymes: Recent Labs  Lab 09/10/19 1710  CKTOTAL 36*   No results for input(s): PROBNP in the last 8760 hours. Coagulation Profile: No results for input(s): INR, PROTIME in the last 168 hours. Thyroid Function Tests: No results for input(s): TSH, T4TOTAL, FREET4, T3FREE, THYROIDAB in the  last 72 hours. Lipid Profile: No results for input(s): CHOL, HDL, LDLCALC, TRIG, CHOLHDL, LDLDIRECT in the last 72 hours. Anemia Panel: No results for input(s): VITAMINB12, FOLATE, FERRITIN, TIBC, IRON, RETICCTPCT in the last 72 hours. Urine analysis:    Component Value Date/Time   COLORURINE YELLOW 09/10/2019 2142   APPEARANCEUR HAZY (A) 09/10/2019 2142   LABSPEC 1.018 09/10/2019 2142   PHURINE 5.0 09/10/2019 2142   GLUCOSEU NEGATIVE 09/10/2019 2142   HGBUR NEGATIVE 09/10/2019 2142   HGBUR negative 01/04/2010 1409   BILIRUBINUR NEGATIVE 09/10/2019 2142   KETONESUR NEGATIVE 09/10/2019 2142   PROTEINUR NEGATIVE 09/10/2019 2142   UROBILINOGEN 0.2 03/30/2012 2227   NITRITE NEGATIVE 09/10/2019 2142   LEUKOCYTESUR NEGATIVE 09/10/2019 2142   Sepsis Labs: Invalid input(s): PROCALCITONIN, [place]  Microbiology: Recent Results (from the past 240 hour(s))  SARS Coronavirus 2 by RT PCR (hospital order, performed in [facility] hospital lab) Nasopharyngeal Nasopharyngeal Swab     Status: None   Collection Time: 09/10/19  6:06 PM   Specimen: Nasopharyngeal Swab  Result Value Ref Range Status   SARS Coronavirus 2 NEGATIVE NEGATIVE Final    Comment: (NOTE) SARS-CoV-2 target nucleic acids are NOT DETECTED. The SARS-CoV-2 RNA is generally detectable in upper and lower respiratory specimens during the acute phase of infection. The lowest concentration of SARS-CoV-2 viral copies this assay can detect is 250 copies / mL. A negative result does not preclude SARS-CoV-2 infection and should not be used as the sole basis for treatment or other patient management decisions.  A negative result may occur with improper specimen collection / handling, submission of specimen other than nasopharyngeal swab, presence of viral mutation(s) within the areas targeted by this assay, and inadequate number of viral copies (<250 copies / mL). A negative result must be combined with clinical observations,  patient history, and epidemiological information. Fact Sheet for Patients:   StrictlyIdeas.no Fact Sheet for Healthcare Providers: BankingDealers.co.za This test is not yet approved or cleared  by the [place] FDA and has been authorized for detection and/or diagnosis of SARS-CoV-2 by FDA under an Emergency Use Authorization (EUA).  This EUA will remain in effect (meaning this test can be used) for the duration of the COVID-19 declaration under Section 564(b)(1) of the Act, 21 U.S.C. section 360bbb-3(b)(1), unless the authorization is terminated or revoked sooner. Performed at [facility], [place] [address] [place], [place] [address]     Radiology Studies: DG Tibia/Fibula Right  Result Date: 09/10/2019 CLINICAL DATA:  Right lower extremity pain. EXAM: RIGHT TIBIA AND FIBULA - 2 VIEW COMPARISON:  None. FINDINGS: There is no evidence of fracture or other focal bone lesions. Vascular calcifications are noted. IMPRESSION: Negative. Electronically Signed   By: Marijo Conception M.D.   On: 09/10/2019 16:42   CT Head Wo Contrast  Result Date: 09/10/2019 CLINICAL DATA:  79 year old male status post fall. Headache and back pain. EXAM: CT HEAD WITHOUT CONTRAST TECHNIQUE: Contiguous axial images were obtained from the base of the skull through the vertex without intravenous contrast. COMPARISON:  None. FINDINGS: Brain: Cerebral volume is within normal limits for age. No midline shift, ventriculomegaly, mass effect, evidence of mass lesion, intracranial hemorrhage or evidence of cortically based acute infarction. Gray-white matter differentiation is within normal limits throughout the brain. Vascular: Calcified atherosclerosis at the skull base. No suspicious intracranial vascular hyperdensity. Skull: No fracture identified. Sinuses/Orbits: Opacified right mastoids. Right tympanic cavity remains clear. Other Visualized paranasal sinuses and mastoids  are well pneumatized. Other: Mild posterior right occiput region scalp contusion on series 4, image 26. Underlying calvarium appears intact. No other orbit or scalp soft tissue injury identified. IMPRESSION: 1. Mild posterior right occiput region scalp contusion without underlying skull fracture. 2. Normal for age non contrast CT appearance of the brain. 3. Opacified right mastoid air cells, probably inflammatory. Electronically Signed   By: Genevie Ann M.D.   On: 09/10/2019 19:43   CT Cervical Spine Wo Contrast  Result Date: 09/10/2019 CLINICAL DATA:  79 year old male status post fall. Headache and back pain. EXAM: CT CERVICAL SPINE WITHOUT CONTRAST TECHNIQUE: Multidetector CT imaging of the cervical spine was performed without intravenous contrast. Multiplanar CT image reconstructions were also generated. COMPARISON:  Head CT today reported separately. FINDINGS: Alignment: Straightening of cervical lordosis. Cervicothoracic junction alignment is within normal limits. Bilateral posterior element alignment is within normal limits. Skull base and vertebrae: Visualized  skull base is intact. No atlanto-occipital dissociation. No acute osseous abnormality identified. Soft tissues and spinal canal: No prevertebral fluid or swelling. No visible canal hematoma. Calcified carotid atherosclerosis in the neck with a partially retropharyngeal course of both carotids. Disc levels: Widespread cervical disc and endplate degeneration. There is some ossification of the posterior longitudinal ligament (OPLL). Suspected in the lower cervical spine at C6 (sagittal image 57). At least mild associated spinal stenosis. Upper chest: Visible upper thoracic levels appear intact. Negative lung apices. IMPRESSION: 1. No acute traumatic injury identified in the cervical spine. 2. Widespread cervical disc and endplate degeneration, with ossification of the posterior longitudinal ligament (OPLL) and at least mild spinal stenosis at C6.  Electronically Signed   By: Genevie Ann M.D.   On: 09/10/2019 19:48   CT Thoracic Spine Wo Contrast  Result Date: 09/10/2019 CLINICAL DATA:  79 year old male status post fall. Headache and back pain. EXAM: CT THORACIC SPINE WITHOUT CONTRAST TECHNIQUE: Multidetector CT images of the thoracic were obtained using the standard protocol without intravenous contrast. COMPARISON:  Cervical spine CT today reported separately. Chest CT 10/26/2015. FINDINGS: Limited cervical spine imaging:  Reported separately today. Thoracic spine segmentation:  Normal. Alignment:  Stable thoracic kyphosis since 2017. Vertebrae: Several levels of interbody ankylosis via flowing endplate osteophytes, most notably T8-T9. There is subtle compression of the T12 superior endplate which is new since 2017 (series 7, image 61). Subtle endplate cortical fracture lucency is identified. No retropulsed bone or complicating features. No other acute osseous abnormality identified. Other thoracic vertebrae and visible ribs appear intact. Paraspinal and other soft tissues: Calcified aortic atherosclerosis. Small calcified left hilar lymph nodes. No pericardial or pleural effusion. Mild left lung base chronic scarring or atelectasis. Negative visible noncontrast upper abdominal viscera. Thoracic paraspinal soft tissues remain within normal limits. Disc levels: Generally mild for age thoracic spine degeneration, not definitely progressed since 2017. IMPRESSION: 1. Mild acute T12 superior endplate compression fracture. No retropulsed bone or complicating features. 2. No other acute traumatic injury identified in the thoracic spine. 3. Aortic Atherosclerosis (ICD10-I70.0). Electronically Signed   By: Genevie Ann M.D.   On: 09/10/2019 19:54   CT Lumbar Spine Wo Contrast  Result Date: 09/10/2019 CLINICAL DATA:  79 year old male status post fall. Headache and back pain. EXAM: CT LUMBAR SPINE WITHOUT CONTRAST TECHNIQUE: Multidetector CT imaging of the lumbar spine  was performed without intravenous contrast administration. Multiplanar CT image reconstructions were also generated. COMPARISON:  Thoracic spine CT today reported separately. CT Abdomen and Pelvis 10/06/2017. FINDINGS: Segmentation: Normal, concordant with the thoracic spine numbering today. Alignment: Straightening of lumbar lordosis is stable since 2019. There is subtle chronic anterolisthesis of L5 on S1. Vertebrae: Mild T12 superior endplate compression fracture again noted. There are chronic bilateral L5 pars fractures, unchanged since 2019. The L1 through L4 levels appears stable and intact. Visible sacrum and SI joints appear intact. Paraspinal and other soft tissues: Calcified aortic atherosclerosis. Stable visible noncontrast abdominal and pelvic viscera. Diverticulosis of the sigmoid colon. Negative lumbar paraspinal soft tissues. Disc levels: Multilevel advanced lumbar disc degeneration with vacuum disc. Multifactorial degenerative lumbar spinal stenosis appears maximal at L3-L4 and stable since 2019. IMPRESSION: 1. No acute traumatic injury identified in the lumbar spine. Chronic bilateral L5 pars fractures. 2. Chronically advanced lumbar spine degeneration stable since the 2019 CT. Electronically Signed   By: Genevie Ann M.D.   On: 09/10/2019 19:59   DG Chest Portable 1 View  Result Date: 09/10/2019 CLINICAL DATA:  Syncope.  EXAM: PORTABLE CHEST 1 VIEW COMPARISON:  June 02, 2019 FINDINGS: There is no evidence of acute infiltrate, pleural effusion or pneumothorax. The cardiac silhouette is moderately enlarged and unchanged in size. There is mild calcification of the aortic arch. The visualized skeletal structures are unremarkable. IMPRESSION: No active disease. Electronically Signed   By: Virgina Norfolk M.D.   On: 09/10/2019 16:43     Amer Alcindor T. Lorimor  If 7PM-7AM, please contact night-coverage [email or web address] Password [facility] 09/11/2019, 2:59 PM

## 2019-09-11 NOTE — Plan of Care (Signed)
  Problem: Education: Goal: Knowledge of General Education information will improve Description: Including pain rating scale, medication(s)/side effects and non-pharmacologic comfort measures 09/11/2019 2339 by Damita Dunnings, RN Outcome: Progressing 09/11/2019 2337 by Damita Dunnings, RN Outcome: Progressing   Problem: Health Behavior/Discharge Planning: Goal: Ability to manage health-related needs will improve 09/11/2019 2339 by Damita Dunnings, RN Outcome: Progressing 09/11/2019 2337 by Wilfrid Lund Cares M, RN Outcome: Progressing   Problem: Clinical Measurements: Goal: Ability to maintain clinical measurements within normal limits will improve 09/11/2019 2339 by Damita Dunnings, RN Outcome: Progressing 09/11/2019 2337 by Damita Dunnings, RN Outcome: Progressing Goal: Will remain free from infection 09/11/2019 2339 by Damita Dunnings, RN Outcome: Progressing 09/11/2019 2337 by Damita Dunnings, RN Outcome: Progressing Goal: Diagnostic test results will improve 09/11/2019 2339 by Damita Dunnings, RN Outcome: Progressing 09/11/2019 2337 by Damita Dunnings, RN Outcome: Progressing Goal: Respiratory complications will improve 09/11/2019 2339 by Damita Dunnings, RN Outcome: Progressing 09/11/2019 2337 by Damita Dunnings, RN Outcome: Progressing Goal: Cardiovascular complication will be avoided 09/11/2019 2339 by Damita Dunnings, RN Outcome: Progressing 09/11/2019 2337 by Damita Dunnings, RN Outcome: Progressing   Problem: Activity: Goal: Risk for activity intolerance will decrease 09/11/2019 2339 by Damita Dunnings, RN Outcome: Progressing 09/11/2019 2337 by Damita Dunnings, RN Outcome: Progressing   Problem: Nutrition: Goal: Adequate nutrition will be maintained 09/11/2019 2339 by Damita Dunnings, RN Outcome: Progressing 09/11/2019 2337  by Damita Dunnings, RN Outcome: Progressing   Problem: Coping: Goal: Level of anxiety will decrease 09/11/2019 2339 by Damita Dunnings, RN Outcome: Progressing 09/11/2019 2337 by Wilfrid Lund Cares M, RN Outcome: Progressing   Problem: Elimination: Goal: Will not experience complications related to bowel motility 09/11/2019 2339 by Damita Dunnings, RN Outcome: Progressing 09/11/2019 2337 by Damita Dunnings, RN Outcome: Progressing Goal: Will not experience complications related to urinary retention 09/11/2019 2339 by Damita Dunnings, RN Outcome: Progressing 09/11/2019 2337 by Damita Dunnings, RN Outcome: Progressing   Problem: Pain Managment: Goal: General experience of comfort will improve 09/11/2019 2339 by Damita Dunnings, RN Outcome: Progressing 09/11/2019 2337 by Wilfrid Lund Cares M, RN Outcome: Progressing   Problem: Safety: Goal: Ability to remain free from injury will improve 09/11/2019 2339 by Damita Dunnings, RN Outcome: Progressing 09/11/2019 2337 by Damita Dunnings, RN Outcome: Progressing   Problem: Skin Integrity: Goal: Risk for impaired skin integrity will decrease 09/11/2019 2339 by Damita Dunnings, RN Outcome: Progressing 09/11/2019 2337 by Damita Dunnings, RN Outcome: Progressing

## 2019-09-11 NOTE — Progress Notes (Signed)
Pt refused orthostatic vital signs; he said he cant tolerate standing up due to pain.

## 2019-09-11 NOTE — Progress Notes (Signed)
  Echocardiogram 2D Echocardiogram has been performed with [facility].  Brian Silva 09/11/2019, 5:20 PM

## 2019-09-11 NOTE — Plan of Care (Signed)

## 2019-09-11 NOTE — Plan of Care (Signed)

## 2019-09-11 NOTE — Evaluation (Signed)
Physical Therapy Evaluation Patient Details Name: Brian Silva MRN: [id number] DOB: 08-17-1940 Today's Date: 09/11/2019   History of Present Illness  Brian Silva is a 79 y.o. male with medical history significant of morbid obesity, chronic respiratory failure on home O2, asthma, hyperlipidemia, hypertension, obstructive sleep apnea, diabetes and history of syncopal episodes who presented today after having a fall at home with low back pain. Imaging significant for T12 compression fracture.   Clinical Impression  Pt admitted with above. Prior to admission, he lives alone and is independent with mobility using a walker. On PT evaluation, pt presents with decreased functional mobility secondary to severe back pain, generalized weakness, obesity. Unable to achieve sitting on edge of bed this session due to sharp increase in pain (despite IV morphine premedication) and two person assist. However, he is motivated and I think he will continue to progress slowly with appropriate pain control/management. Education initiated regarding increasing mobility as tolerated and log roll technique. Recommending SNF to maximize functional mobility in light of deficits and decreased caregiver support. Pt is agreeable with this POC.     Follow Up Recommendations SNF    Equipment Recommendations  Other (comment)(defer)    Recommendations for Other Services       Precautions / Restrictions Precautions Precautions: Fall Restrictions Weight Bearing Restrictions: No      Mobility  Bed Mobility Overal bed mobility: Needs Assistance Bed Mobility: Rolling;Sidelying to Sit;Sit to Sidelying Rolling: Mod assist;+2 for physical assistance Sidelying to sit: Max assist;+2 for physical assistance     Sit to sidelying: Total assist;+2 for physical assistance General bed mobility comments: Pt rolling to left with cues for bending right knee and reaching with right arm. Use of bed pad to facilitate complete  roll to sidelying. Cues for bringing legs off edge of bed, but once attempted transition up to partial sitting, pt placing legs back onto bed, making it difficult to complete transfer. Pt then requesting to lie back down. totalA + 2 to return to supine   Transfers                 General transfer comment: unable  Ambulation/Gait                Stairs            Wheelchair Mobility    Modified Rankin (Stroke Patients Only)       Balance                                             Pertinent Vitals/Pain Pain Assessment: Faces Faces Pain Scale: Hurts worst Pain Location: Low back with mobility Pain Descriptors / Indicators: Grimacing;Guarding;Sharp Pain Intervention(s): Limited activity within patient's tolerance;Monitored during session;Repositioned;Premedicated before session    Home Living Family/patient expects to be discharged to:: Private residence Living Arrangements: Alone Available Help at Discharge: Friend(s);Available PRN/intermittently Type of Home: Apartment Home Access: Ramped entrance     Home Layout: One level Home Equipment: Walker - 2 wheels;Toilet riser      Prior Function Level of Independence: Needs assistance   Gait / Transfers Assistance Needed: Using walker  ADL's / Homemaking Assistance Needed: Indep ADL's/IADL's  Comments: Has friend 1x/week to assist with cleaning. Pt reports he was driving.     Hand Dominance        Extremity/Trunk Assessment   Upper Extremity Assessment Upper  Extremity Assessment: Overall WFL for tasks assessed    Lower Extremity Assessment Lower Extremity Assessment: Generalized weakness    Cervical / Trunk Assessment Cervical / Trunk Assessment: Other exceptions Cervical / Trunk Exceptions: increased body habitus  Communication   Communication: No difficulties  Cognition Arousal/Alertness: Awake/alert Behavior During Therapy: WFL for tasks assessed/performed Overall  Cognitive Status: Within Functional Limits for tasks assessed                                        General Comments      Exercises Other Exercises Other Exercises: Encouraged bilateral ankle pumps, heel slides, SLR's as tolerated   Assessment/Plan    PT Assessment Patient needs continued PT services  PT Problem List Decreased strength;Decreased activity tolerance;Decreased mobility;Obesity;Pain       PT Treatment Interventions DME instruction;Gait training;Therapeutic activities;Functional mobility training;Therapeutic exercise;Balance training;Patient/family education    PT Goals (Current goals can be found in the Care Plan section)  Acute Rehab PT Goals Patient Stated Goal: go to rehab PT Goal Formulation: With patient Time For Goal Achievement: 09/25/19 Potential to Achieve Goals: Fair    Frequency Min 3X/week   Barriers to discharge        Co-evaluation               AM-PAC PT "6 Clicks" Mobility  Outcome Measure Help needed turning from your back to your side while in a flat bed without using bedrails?: A Lot Help needed moving from lying on your back to sitting on the side of a flat bed without using bedrails?: Total Help needed moving to and from a bed to a chair (including a wheelchair)?: Total Help needed standing up from a chair using your arms (e.g., wheelchair or bedside chair)?: Total Help needed to walk in hospital room?: Total Help needed climbing 3-5 steps with a railing? : Total 6 Click Score: 7    End of Session   Activity Tolerance: Patient limited by pain Patient left: in bed;with call bell/phone within reach;with bed alarm set Nurse Communication: Mobility status PT Visit Diagnosis: Pain;Other abnormalities of gait and mobility (R26.89) Pain - part of body: (back)    Time: 5916-3846 PT Time Calculation (min) (ACUTE ONLY): 20 min   Charges:   PT Evaluation $PT Eval Moderate Complexity: 1 Mod             Wyona Almas, PT, DPT Acute Rehabilitation Services Pager [phone] Office [phone]   Deno Etienne 09/11/2019, 1:39 PM

## 2019-09-12 LAB — RENAL FUNCTION PANEL
Albumin: 2.5 g/dL — ABNORMAL LOW (ref 3.5–5.0)
Anion gap: 8 (ref 5–15)
BUN: 21 mg/dL (ref 8–23)
CO2: 28 mmol/L (ref 22–32)
Calcium: 8.7 mg/dL — ABNORMAL LOW (ref 8.9–10.3)
Chloride: 99 mmol/L (ref 98–111)
Creatinine, Ser: 1.2 mg/dL (ref 0.61–1.24)
GFR calc Af Amer: >60 mL/min (ref >60)
GFR calc non Af Amer: 58 mL/min — ABNORMAL LOW (ref >60)
Glucose, Bld: 117 mg/dL — ABNORMAL HIGH (ref 70–99)
Phosphorus: 3.7 mg/dL (ref 2.5–4.6)
Potassium: 3.6 mmol/L (ref 3.5–5.1)
Sodium: 135 mmol/L (ref 135–145)

## 2019-09-12 LAB — CORTISOL-AM, BLOOD: Cortisol - AM: 10.9 ug/dL (ref 6.7–22.6)

## 2019-09-12 LAB — CBC
HCT: 31.2 % — ABNORMAL LOW (ref 39.0–52.0)
Hemoglobin: 9.6 g/dL — ABNORMAL LOW (ref 13.0–17.0)
MCH: 25.5 pg — ABNORMAL LOW (ref 26.0–34.0)
MCHC: 30.8 g/dL (ref 30.0–36.0)
MCV: 83 fL (ref 80.0–100.0)
Platelets: 454 10*3/uL — ABNORMAL HIGH (ref 150–400)
RBC: 3.76 MIL/uL — ABNORMAL LOW (ref 4.22–5.81)
RDW: 14.7 % (ref 11.5–15.5)
WBC: 8.7 10*3/uL (ref 4.0–10.5)
nRBC: 0 % (ref 0.0–0.2)

## 2019-09-12 LAB — TSH: TSH: 3.338 u[IU]/mL (ref 0.350–4.500)

## 2019-09-12 LAB — GLUCOSE, CAPILLARY
Glucose-Capillary: 117 mg/dL — ABNORMAL HIGH (ref 70–99)
Glucose-Capillary: 108 mg/dL — ABNORMAL HIGH (ref 70–99)
Glucose-Capillary: 146 mg/dL — ABNORMAL HIGH (ref 70–99)
Glucose-Capillary: 153 mg/dL — ABNORMAL HIGH (ref 70–99)
Glucose-Capillary: 121 mg/dL — ABNORMAL HIGH (ref 70–99)

## 2019-09-12 LAB — MAGNESIUM: Magnesium: 1.9 mg/dL (ref 1.7–2.4)

## 2019-09-12 MED ORDER — COSYNTROPIN 0.25 MG IJ SOLR
0.2500 mg | Freq: Once | INTRAMUSCULAR | Status: AC
  Administered 2019-09-13: 0.25 mg via INTRAVENOUS
  Filled 2019-09-12: qty 0.25

## 2019-09-12 MED ORDER — SODIUM CHLORIDE 0.9 % IV SOLN: INTRAVENOUS | Status: DC

## 2019-09-12 NOTE — Plan of Care (Signed)
  Problem: Education: Goal: Knowledge of General Education information will improve Description: Including pain rating scale, medication(s)/side effects and non-pharmacologic comfort measures Outcome: Progressing   Problem: Pain Managment: Goal: General experience of comfort will improve Outcome: Progressing   Problem: Safety: Goal: Ability to remain free from injury will improve Outcome: Progressing   

## 2019-09-12 NOTE — Progress Notes (Signed)
PROGRESS NOTE  Brian Silva WUJ:[id number] DOB: 07/12/1940   PCP: Benito Mccreedy, MD  Patient is from: Home. Lives alone. Uses walker at baseline.  DOA: 09/10/2019 LOS: 2  Brief Narrative / Interim history: 79 year old male with history of morbid obesity, chronic RF on oxygen, OSA, HTN, DM-2 and syncope presenting with lower back pain after syncopal fall at home. Patient felt lightheaded when he got up off his commode and fell on the floor. Not sure how long he was on the ground. When he woke up, he called EMS. When EMS arrived, hypotensive to 105/50. He reports uncontrolled hypertension with labile blood pressure. Blood pressure drops when he takes his medications, and elevated without meds.  In ED, CT head/cervical spine/thoracic/lumbar done. Mild T12 compression fracture noted. Admitted for pain control, therapy evaluation and syncope.  Therapy recommended SNF.  Waiting on SNF bed.  Subjective: Seen and examined earlier this morning.  No major events overnight of this morning.  Continues to endorse significant back pain.  He rates his pain 8/10.  Denies new focal neuro symptoms in his legs.  Denies bowel or bladder issue.  Objective: Vitals:   09/11/19 0756 09/11/19 1920 09/12/19 0440 09/12/19 0729  BP: (!) 111/51 (!) 116/57 (!) 122/56 (!) 132/47  Pulse: 69 89 68 65  Resp: 14 16 16 18   Temp: 98.2 F (36.8 C) 98.2 F (36.8 C) 98 F (36.7 C) 98.4 F (36.9 C)  TempSrc: Oral Oral Oral Oral  SpO2: 90% 94% 98% 91%  Height:        Intake/Output Summary (Last 24 hours) at 09/12/2019 1213 Last data filed at 09/12/2019 1159 Gross per 24 hour  Intake 140 ml  Output 1325 ml  Net -1185 ml   There were no vitals filed for this visit.  Examination:  GENERAL: No apparent distress.  Nontoxic. HEENT: MMM.  Vision and hearing grossly intact.  NECK: Supple.  No apparent JVD.  RESP:  No IWOB.  Fair aeration bilaterally. CVS:  RRR. Heart sounds normal.  ABD/GI/GU: BS+. Abd soft,  NTND.  MSK/EXT:  Moves extremities. No apparent deformity. No edema.  SKIN: no apparent skin lesion or wound NEURO: Awake, alert and oriented appropriately.  No apparent focal neuro deficit. PSYCH: Calm. Normal affect.  Procedures:  None  Microbiology summarized: COVID-19 PCR negative.  Assessment & Plan: Syncope-history suggestive for orthostatic hypotension. Could have autonomic dysregulation in the setting of diabetes.  Echo with normal EF and G1 DD.  A.m. cortisol on lower side of normal.  TSH within normal.  Was not able to do orthostatic vitals due to pain with movement -Orthostatic vitals every shift once pain fairly controlled -Treat BP for standing blood pressures -Continue holding home antihypertensives -Apply TED hose. -Check ACTH and ACTH stimulation test in the morning  Syncopal fall at home T12 compression fracture -Pain control with Tylenol, tramadol, oxycodone and morphine based on pain scale -Bowel regimen -Fall precautions   Obstructive sleep apnea -Continue nightly CPAP  Controlled DM-2: On Metformin at home.  A1c 6.4%. Recent Labs    09/11/19 2015 09/12/19 0735 09/12/19 1142  GLUCAP 149* 117* 108*  -Continue SSI and statin.  Morbid obesity:Body mass index is 55.35 kg/m. -Recommend lifestyle change to lose weight -He could benefit from GLP-1 inhibitors  Essential hypertension with labile blood pressures-soft blood pressures, especially diastolic. -Continue holding BP meds. -We will treat for standing blood pressure -Continue IV fluid  Debility/physical deconditioning: Lives alone. Uses walker at baseline. -PT/OT eval-SNF  Goal of  care: Patient with significant comorbidities as above. Discussed about CODE STATUS, and the pros and cons of CPR and intubation. I recommended against CPR or intubation in case of emergency but patient wishes to remain full code.          DVT prophylaxis: Subcu Lovenox Code Status: Full code Family Communication:  Patient states he has no family member to update Status is: Inpatient  Remains inpatient appropriate because:Hemodynamically unstable, Unsafe d/c plan and IV treatments appropriate due to intensity of illness or inability to take PO.  Remains on IV fluids with soft blood pressures.  Needs evaluation for possible adrenal insufficiency.   Dispo: The patient is from: Home              Anticipated d/c is to: SNF              Anticipated d/c date is: 2 days              Patient currently is not medically stable to d/c.        Consultants:  None   Sch Meds:  Scheduled Meds: . acetaminophen  1,000 mg Oral Q8H  . [START ON 09/13/2019] cosyntropin  0.25 mg Intravenous Once  . enoxaparin (LOVENOX) injection  40 mg Subcutaneous Daily  . insulin aspart  0-20 Units Subcutaneous TID WC  . insulin aspart  0-5 Units Subcutaneous QHS   Continuous Infusions: . sodium chloride     PRN Meds:.morphine injection, ondansetron **OR** ondansetron (ZOFRAN) IV, traMADol  Antimicrobials: Anti-infectives (From admission, onward)   None       I have personally reviewed the following labs and images: CBC: Recent Labs  Lab 09/10/19 1710 09/10/19 1724 09/11/19 0601 09/12/19 0856  WBC 13.2*  --  9.7 8.7  NEUTROABS 11.8*  --   --   --   HGB 11.2* 10.9* 10.5* 9.6*  HCT 36.0* 32.0* 33.4* 31.2*  MCV 83.9  --  82.9 83.0  PLT 507*  --  454* 454*   BMP &GFR Recent Labs  Lab 09/10/19 1710 09/10/19 1724 09/11/19 0601 09/12/19 0856  NA 139 138 138 135  K 4.2 4.0 3.5 3.6  CL 100 104 101 99  CO2 25  --  27 28  GLUCOSE 157* 153* 111* 117*  BUN 20 22 20 21   CREATININE 1.52* 1.60* 1.39* 1.20  CALCIUM 9.4  --  8.9 8.7*  MG  --   --   --  1.9  PHOS  --   --   --  3.7   CrCl cannot be calculated (Unknown ideal weight.). Liver & Pancreas: Recent Labs  Lab 09/10/19 1710 09/11/19 0601 09/12/19 0856  AST 16 12*  --   ALT 13 12  --   ALKPHOS 98 86  --   BILITOT 0.8 0.9  --   PROT 6.9 6.3*   --   ALBUMIN 2.9* 2.6* 2.5*   No results for input(s): LIPASE, AMYLASE in the last 168 hours. No results for input(s): AMMONIA in the last 168 hours. Diabetic: Recent Labs    09/11/19 0601  HGBA1C 6.4*   Recent Labs  Lab 09/11/19 1215 09/11/19 1657 09/11/19 2015 09/12/19 0735 09/12/19 1142  GLUCAP 104* 112* 149* 117* 108*   Cardiac Enzymes: Recent Labs  Lab 09/10/19 1710  CKTOTAL 36*   No results for input(s): PROBNP in the last 8760 hours. Coagulation Profile: No results for input(s): INR, PROTIME in the last 168 hours. Thyroid Function Tests: Recent Labs  09/12/19 0856  TSH 3.338   Lipid Profile: No results for input(s): CHOL, HDL, LDLCALC, TRIG, CHOLHDL, LDLDIRECT in the last 72 hours. Anemia Panel: No results for input(s): VITAMINB12, FOLATE, FERRITIN, TIBC, IRON, RETICCTPCT in the last 72 hours. Urine analysis:    Component Value Date/Time   COLORURINE YELLOW 09/10/2019 2142   APPEARANCEUR HAZY (A) 09/10/2019 2142   LABSPEC 1.018 09/10/2019 2142   PHURINE 5.0 09/10/2019 2142   GLUCOSEU NEGATIVE 09/10/2019 2142   HGBUR NEGATIVE 09/10/2019 2142   HGBUR negative 01/04/2010 1409   BILIRUBINUR NEGATIVE 09/10/2019 2142   KETONESUR NEGATIVE 09/10/2019 2142   PROTEINUR NEGATIVE 09/10/2019 2142   UROBILINOGEN 0.2 03/30/2012 2227   NITRITE NEGATIVE 09/10/2019 2142   LEUKOCYTESUR NEGATIVE 09/10/2019 2142   Sepsis Labs: Invalid input(s): PROCALCITONIN, [place]  Microbiology: Recent Results (from the past 240 hour(s))  SARS Coronavirus 2 by RT PCR (hospital order, performed in [facility] hospital lab) Nasopharyngeal Nasopharyngeal Swab     Status: None   Collection Time: 09/10/19  6:06 PM   Specimen: Nasopharyngeal Swab  Result Value Ref Range Status   SARS Coronavirus 2 NEGATIVE NEGATIVE Final    Comment: (NOTE) SARS-CoV-2 target nucleic acids are NOT DETECTED. The SARS-CoV-2 RNA is generally detectable in upper and lower respiratory specimens  during the acute phase of infection. The lowest concentration of SARS-CoV-2 viral copies this assay can detect is 250 copies / mL. A negative result does not preclude SARS-CoV-2 infection and should not be used as the sole basis for treatment or other patient management decisions.  A negative result may occur with improper specimen collection / handling, submission of specimen other than nasopharyngeal swab, presence of viral mutation(s) within the areas targeted by this assay, and inadequate number of viral copies (<250 copies / mL). A negative result must be combined with clinical observations, patient history, and epidemiological information. Fact Sheet for Patients:   StrictlyIdeas.no Fact Sheet for Healthcare Providers: BankingDealers.co.za This test is not yet approved or cleared  by the [place] FDA and has been authorized for detection and/or diagnosis of SARS-CoV-2 by FDA under an Emergency Use Authorization (EUA).  This EUA will remain in effect (meaning this test can be used) for the duration of the COVID-19 declaration under Section 564(b)(1) of the Act, 21 U.S.C. section 360bbb-3(b)(1), unless the authorization is terminated or revoked sooner. Performed at [facility], [place] [address], [place], [place] [address]   Urine culture     Status: None   Collection Time: 09/10/19  9:46 PM   Specimen: Urine, Clean Catch  Result Value Ref Range Status   Specimen Description URINE, CLEAN CATCH  Final   Special Requests NONE  Final   Culture   Final    NO GROWTH Performed at [facility], [place] [address] [place], [place] [address]    Report Status 09/11/2019 FINAL  Final    Radiology Studies: ECHOCARDIOGRAM LIMITED  Result Date: 09/11/2019    ECHOCARDIOGRAM LIMITED REPORT   Patient Name:   HAIDEN RAWLINSON Date of Exam: 09/11/2019 Medical Rec #:  [id number]        Height:       69.0 in Accession #:    [id number]        Weight:       374.8 lb Date of Birth:  Aug 25, 1940        BSA:          2.698 m Patient Age:    15 years  BP:           111/51 mmHg Patient Gender: M                HR:           77 bpm. Exam Location:  Inpatient Procedure: 2D Echo, Cardiac Doppler, Color Doppler and Intracardiac            Opacification Agent Indications:    Syncope  History:        Patient has prior history of Echocardiogram examinations, most                 recent 01/17/2018. Risk Factors:Sleep Apnea, Hypertension,                 Dyslipidemia and Diabetes.  Sonographer:    Clayton Lefort RDCS (AE) Referring Phys: [id number] [facility]  Sonographer Comments: Technically challenging study due to limited acoustic windows, Technically difficult study due to poor echo windows, suboptimal parasternal window, suboptimal apical window and patient is morbidly obese. Image acquisition challenging due to patient body habitus. IMPRESSIONS  1. Left ventricular ejection fraction, by estimation, is 60 to 65%. The left ventricle has normal function. There is moderate left ventricular hypertrophy. Left ventricular diastolic parameters are consistent with Grade I diastolic dysfunction (impaired  relaxation).  2. Right ventricular systolic function is mildly reduced. The right ventricular size is mild-moderately enlarged. Tricuspid regurgitation signal is inadequate for assessing PA pressure.  3. The mitral valve is grossly normal. No evidence of mitral valve regurgitation.  4. The aortic valve is grossly normal. Aortic valve regurgitation is not visualized. No aortic stenosis is present.  5. The inferior vena cava is normal in size with greater than 50% respiratory variability, suggesting right atrial pressure of 3 mmHg. FINDINGS  Left Ventricle: Left ventricular ejection fraction, by estimation, is 60 to 65%. The left ventricle has normal function. Definity contrast agent was given IV to delineate the left ventricular endocardial borders. There is moderate  left ventricular hypertrophy. Right Ventricle: The right ventricular size is mild-moderately enlarged. Right ventricular systolic function is mildly reduced. Tricuspid regurgitation signal is inadequate for assessing PA pressure. Pericardium: Trivial pericardial effusion is present. The pericardial effusion is anterior to the right ventricle. Mitral Valve: The mitral valve is grossly normal. MV peak gradient, 6.8 mmHg. The mean mitral valve gradient is 3.0 mmHg. Aortic Valve: The aortic valve is grossly normal. Aortic valve regurgitation is not visualized. No aortic stenosis is present. Aortic valve mean gradient measures 5.0 mmHg. Aortic valve peak gradient measures 10.1 mmHg. Aortic valve area, by VTI measures  3.15 cm. Pulmonic Valve: The pulmonic valve was not well visualized. Aorta: The aortic root is normal in size and structure. Venous: The inferior vena cava is normal in size with greater than 50% respiratory variability, suggesting right atrial pressure of 3 mmHg. IAS/Shunts: No atrial level shunt detected by color flow Doppler.  LEFT VENTRICLE PLAX 2D LVIDd:         4.60 cm  Diastology LVIDs:         2.90 cm  LV e' lateral:   8.27 cm/s LV PW:         1.40 cm  LV E/e' lateral: 13.8 LV IVS:        1.50 cm  LV e' medial:    6.42 cm/s LVOT diam:     2.40 cm  LV E/e' medial:  17.8 LV SV:         95 LV  SV Index:   35 LVOT Area:     4.52 cm  IVC IVC diam: 1.70 cm LEFT ATRIUM         Index LA diam:    3.00 cm 1.11 cm/m  AORTIC VALVE AV Area (Vmax):    3.41 cm AV Area (Vmean):   3.62 cm AV Area (VTI):     3.15 cm AV Vmax:           159.00 cm/s AV Vmean:          110.000 cm/s AV VTI:            0.302 m AV Peak Grad:      10.1 mmHg AV Mean Grad:      5.0 mmHg LVOT Vmax:         120.00 cm/s LVOT Vmean:        88.100 cm/s LVOT VTI:          0.210 m LVOT/AV VTI ratio: 0.70  AORTA Ao Root diam: 3.40 cm MITRAL VALVE MV Area (PHT): 3.12 cm     SHUNTS MV Peak grad:  6.8 mmHg     Systemic VTI:  0.21 m MV Mean grad:   3.0 mmHg     Systemic Diam: 2.40 cm MV Vmax:       1.30 m/s MV Vmean:      74.6 cm/s MV Decel Time: 243 msec MV E velocity: 114.00 cm/s MV A velocity: 136.00 cm/s MV E/A ratio:  0.84 Cherlynn Kaiser MD Electronically signed by Cherlynn Kaiser MD Signature Date/Time: 09/11/2019/9:10:37 PM    Final      Dalina Samara T. [place]  If 7PM-7AM, please contact night-coverage [email or web address] Password [facility] 09/12/2019, 12:13 PM

## 2019-09-12 NOTE — NC FL2 (Signed)
[place] LEVEL OF CARE SCREENING TOOL     IDENTIFICATION  Patient Name: Brian Silva Birthdate: December 22, 1940 Sex: male Admission Date (Current Location): 09/10/2019  [facility] and [place] Number:  Herbalist and Address:  [facility] [place]. [facility], [place] [address], [place], [place] [address]      Provider Number: [id number]  Attending Physician Name and Address:  Mercy Riding, MD  Relative Name and Phone Number:       Current Level of Care: Hospital Recommended Level of Care: [place] Prior Approval Number:    Date Approved/Denied:   PASRR Number: [id number]  Discharge Plan: SNF    Current Diagnoses: Patient Active Problem List   Diagnosis Date Noted  . Compression fracture of body of thoracic vertebra ([place]) 09/10/2019  . Fall at home, initial encounter 09/10/2019  . AKI (acute kidney injury) ([place]) 01/17/2018  . Syncope, vasovagal 01/17/2018  . Diabetes mellitus type II, non insulin dependent ([place]) 01/17/2018  . Hyponatremia 01/17/2018  . Syncope 01/17/2018  . Depression 10/25/2015  . Hyperlipidemia 10/25/2015  . TINEA PEDIS 01/04/2010  . TINEA VERSICOLOR 01/04/2010  . URI 01/04/2010  . HYPERGLYCEMIA 12/02/2008  . ABDOMINAL WALL HERNIA 12/01/2007  . OBESITY, MORBID 04/04/2007  . SLEEP APNEA, OBSTRUCTIVE 04/04/2007  . LOW BACK PAIN, CHRONIC 12/07/2004  . HYPERLIPIDEMIA 08/12/2002  . ALLERGIC RHINITIS, SEASONAL 08/12/2002  . Essential hypertension 04/01/2000  . Asthma 10/11/1997  . SUPERFICIAL PHLEBITIS 05/06/1990    Orientation RESPIRATION BLADDER Height & Weight     Self, Time, Situation, Place  Normal Continent Weight:   Height:  5\' 9"  (175.3 cm)  BEHAVIORAL SYMPTOMS/MOOD NEUROLOGICAL BOWEL NUTRITION STATUS      Continent Diet(See discharge summary)  AMBULATORY STATUS COMMUNICATION OF NEEDS Skin   Extensive Assist Verbally Normal                       Personal Care Assistance Level of  Assistance  Bathing, Dressing, Feeding Bathing Assistance: Maximum assistance Feeding assistance: Independent Dressing Assistance: Maximum assistance     Functional Limitations Info  Sight, Speech, Hearing Sight Info: Adequate Hearing Info: Adequate Speech Info: Adequate    SPECIAL CARE FACTORS FREQUENCY  PT (By licensed PT), OT (By licensed OT)     PT Frequency: 5x a week OT Frequency: 5x a week            Contractures Contractures Info: Not present    Additional Factors Info  Code Status, Allergies Code Status Info: FULL Allergies Info: NKA           Current Medications (09/12/2019):  This is the current hospital active medication list Current Facility-Administered Medications  Medication Dose Route Frequency Provider Last Rate Last Admin  . 0.9 %  sodium chloride infusion   Intravenous Continuous Wendee Beavers T, MD 75 mL/hr at 09/12/19 1300 New Bag at 09/12/19 1300  . acetaminophen (TYLENOL) tablet 1,000 mg  1,000 mg Oral Q8H Gonfa, Taye T, MD   1,000 mg at 09/12/19 1425  . [START ON 09/13/2019] cosyntropin (CORTROSYN) injection 0.25 mg  0.25 mg Intravenous Once Gonfa, Taye T, MD      . enoxaparin (LOVENOX) injection 40 mg  40 mg Subcutaneous Daily Gala Romney L, MD   40 mg at 09/12/19 1000  . insulin aspart (novoLOG) injection 0-20 Units  0-20 Units Subcutaneous TID WC Elwyn Reach, MD   4 Units at 09/12/19 1712  . insulin aspart (novoLOG) injection  0-5 Units  0-5 Units Subcutaneous QHS Garba, Mohammad L, MD      . morphine 4 MG/ML injection 4 mg  4 mg Intravenous Q2H PRN Wendee Beavers T, MD   4 mg at 09/12/19 1709  . ondansetron (ZOFRAN) tablet 4 mg  4 mg Oral Q6H PRN Gala Romney L, MD   4 mg at 09/12/19 0818   Or  . ondansetron (ZOFRAN) injection 4 mg  4 mg Intravenous Q6H PRN Gala Romney L, MD      . traMADol (ULTRAM) tablet 50 mg  50 mg Oral Q8H PRN Mercy Riding, MD   50 mg at 09/12/19 1335     Discharge Medications: Please see discharge  summary for a list of discharge medications.  Relevant Imaging Results:  Relevant Lab Results:   Additional Information SSN [id number]  Neysa Hotter [place], [place]

## 2019-09-12 NOTE — Evaluation (Signed)
Occupational Therapy Evaluation Patient Details Name: Brian Silva MRN: [id number] DOB: Sep 03, 1940 Today's Date: 09/12/2019    History of Present Illness [name] is a 79 y.o. male with medical history significant of morbid obesity, chronic respiratory failure on home O2, asthma, hyperlipidemia, hypertension, obstructive sleep apnea, diabetes and history of syncopal episodes who presented today after having a fall at home with low back pain. Imaging significant for T12 compression fracture.    Clinical Impression   This 79 yo male admitted with above presents to acute OT with PLOF of being totally independent with basic ADLs and IADLs. Pt currently is limited by pain and needs setup/S-total A for basic ADLs and +2 for all mobility. He lives alone with only friends able to check on him and will need rehab before he will be able to go back home alone. We will continue to follow acutely.     Follow Up Recommendations  SNF    Equipment Recommendations  Other (comment)(TBD at next venue)       Precautions / Restrictions Precautions Precautions: Fall Restrictions Weight Bearing Restrictions: No      Mobility Bed Mobility Overal bed mobility: Needs Assistance Bed Mobility: Rolling;Sidelying to Sit;Sit to Sidelying Rolling: Min assist Sidelying to sit: Max assist;+2 for physical assistance     Sit to sidelying: Total assist;+2 for physical assistance General bed mobility comments: Pt with cues for sequencing, increased time, and use of bed rail on left could roll to left. Pt able to move Bil LEs off of bed from sidelying position, but then needed max A +2 for trunk and to get scooted around to EOB  Transfers Overall transfer level: Needs assistance Equipment used: Rolling walker (2 wheeled) Transfers: Sit to/from Stand Sit to Stand: Min assist;+2 physical assistance;From elevated surface         General transfer comment: Pt able to side step up towards HOB 2 steps  with min A +2 and use of RW    Balance Overall balance assessment: Needs assistance Sitting-balance support: Bilateral upper extremity supported;Feet supported Sitting balance-Leahy Scale: Poor Sitting balance - Comments: Needs Bil UE support to help with pain in back   Standing balance support: Bilateral upper extremity supported Standing balance-Leahy Scale: Poor Standing balance comment: reliant on RW and additional external support of therapist                           ADL either performed or assessed with clinical judgement   ADL Overall ADL's : Needs assistance/impaired Eating/Feeding: Set up;Bed level   Grooming: Set up;Sitting(EOB)   Upper Body Bathing: Minimal assistance;Sitting Upper Body Bathing Details (indicate cue type and reason): EOB Lower Body Bathing: Total assistance Lower Body Bathing Details (indicate cue type and reason): min A +2 sit<>stand from raised bed Upper Body Dressing : Moderate assistance;Sitting Upper Body Dressing Details (indicate cue type and reason): EOB Lower Body Dressing: Total assistance Lower Body Dressing Details (indicate cue type and reason): min A +2 sit<>stand from raised bed Toilet Transfer: Minimal assistance;+2 for physical assistance;RW Toilet Transfer Details (indicate cue type and reason): side step up towards HOB 2 steps Toileting- Clothing Manipulation and Hygiene: Total assistance Toileting - Clothing Manipulation Details (indicate cue type and reason): min A +2 sit<>stand from raised bed             Vision Patient Visual Report: No change from baseline  Pertinent Vitals/Pain Pain Assessment: 0-10 Pain Score: 8  Pain Location: back with mobility Pain Descriptors / Indicators: Grimacing;Guarding;Sharp;Moaning Pain Intervention(s): Limited activity within patient's tolerance;Monitored during session;Repositioned     Hand Dominance Right   Extremity/Trunk Assessment Upper Extremity  Assessment Upper Extremity Assessment: Overall WFL for tasks assessed           Communication Communication Communication: No difficulties   Cognition Arousal/Alertness: Awake/alert Behavior During Therapy: WFL for tasks assessed/performed Overall Cognitive Status: Within Functional Limits for tasks assessed                                                Home Living Family/patient expects to be discharged to:: Skilled nursing facility Living Arrangements: Alone Available Help at Discharge: Friend(s);Available PRN/intermittently Type of Home: Apartment Home Access: Ramped entrance     Home Layout: One level     Bathroom Shower/Tub: Teacher, early years/pre: Standard     Home Equipment: Environmental consultant - 2 wheels;Toilet riser          Prior Functioning/Environment Level of Independence: Needs assistance  Gait / Transfers Assistance Needed: Using walker ADL's / Homemaking Assistance Needed: Indep ADL's/IADL's   Comments: Has friend 1x/week to assist with cleaning. Pt reports he was driving.        OT Problem List: Decreased strength;Decreased range of motion;Impaired balance (sitting and/or standing);Obesity;Pain      OT Treatment/Interventions: Self-care/ADL training;DME and/or AE instruction;Patient/family education;Balance training    OT Goals(Current goals can be found in the care plan section) Acute Rehab OT Goals Patient Stated Goal: For pain to be less OT Goal Formulation: With patient Time For Goal Achievement: 09/26/19 Potential to Achieve Goals: Good  OT Frequency: Min 2X/week   Barriers to D/C: Decreased caregiver support             AM-PAC OT "6 Clicks" Daily Activity     Outcome Measure Help from another person eating meals?: A Little Help from another person taking care of personal grooming?: A Little Help from another person toileting, which includes using toliet, bedpan, or urinal?: A Lot Help from another person  bathing (including washing, rinsing, drying)?: A Lot Help from another person to put on and taking off regular upper body clothing?: A Little Help from another person to put on and taking off regular lower body clothing?: Total 6 Click Score: 14   End of Session Equipment Utilized During Treatment: Gait belt;Rolling walker Nurse Communication: Mobility status(IV hurting patient and raised area forming near IV site)  Activity Tolerance: Patient limited by pain Patient left: in bed;with call bell/phone within reach;with bed alarm set  OT Visit Diagnosis: Unsteadiness on feet (R26.81);Other abnormalities of gait and mobility (R26.89);Muscle weakness (generalized) (M62.81);History of falling (Z91.81);Pain Pain - part of body: (back)                Time: 1411-1435 OT Time Calculation (min): 24 min Charges:  OT General Charges $OT Visit: 1 Visit OT Evaluation $OT Eval Moderate Complexity: 1 Mod OT Treatments $Self Care/Home Management : 8-22 mins  [place] [address], OTR/L Acute [employer] Pager [phone] Office [phone]     Almon Register 09/12/2019, 3:46 PM

## 2019-09-12 NOTE — TOC Initial Note (Signed)
Transition of Care [facility]) - Initial/Assessment Note    Patient Details  Name: Brian Silva MRN: [id number] Date of Birth: 10-28-1940  Transition of [facility]) CM/SW Contact:    Jacquelynn Cree Phone Number: 09/12/2019, 5:36 PM  Clinical Narrative:                 CSW received consult for possible SNF placement at time of discharge. CSW spoke with patient who is in agreement with PT recommendation of SNF placement. Patient has never been to a SNF and expressed preference for [facility]. No further questions expressed at this time, CSW will continue to follow and assist with discharge planning needs.   Expected Discharge Plan: Skilled Nursing Facility Barriers to Discharge: Continued Medical Work up   Patient Goals and CMS Choice   CMS Medicare.gov Compare Post Acute Care list provided to:: Patient Choice offered to / list presented to : Patient  Expected Discharge Plan and Services Expected Discharge Plan: [place]       Living arrangements for the past 2 months: Single Family Home, Apartment                                      Prior Living Arrangements/Services Living arrangements for the past 2 months: Single Family Home, Apartment Lives with:: Self Patient language and need for interpreter reviewed:: Yes Do you feel safe going back to the place where you live?: Yes      Need for Family Participation in Patient Care: No (Comment) Care giver support system in place?: No (comment)   Criminal Activity/Legal Involvement Pertinent to Current Situation/Hospitalization: No - Comment as needed  Activities of Daily Living Home Assistive Devices/Equipment: Gilford Rile (specify type) ADL Screening (condition at time of admission) Patient's cognitive ability adequate to safely complete daily activities?: Yes Is the patient deaf or have difficulty hearing?: No Does the patient have difficulty seeing, even when wearing glasses/contacts?: No Does  the patient have difficulty concentrating, remembering, or making decisions?: No Patient able to express need for assistance with ADLs?: Yes Does the patient have difficulty dressing or bathing?: No Independently performs ADLs?: Yes (appropriate for developmental age) Does the patient have difficulty walking or climbing stairs?: Yes Weakness of Legs: Both Weakness of Arms/Hands: None  Permission Sought/Granted Permission sought to share information with : Facility Arts administrator granted to share info w AGENCY: SNFs        Emotional Assessment   Attitude/Demeanor/Rapport: Unable to Assess Affect (typically observed): Unable to Assess Orientation: : Oriented to Self, Oriented to Place, Oriented to  Time, Oriented to Situation Alcohol / Substance Use: Not Applicable Psych Involvement: No (comment)  Admission diagnosis:  Compression fracture of body of thoracic vertebra ([place]) [S22.000A] Patient Active Problem List   Diagnosis Date Noted  . Compression fracture of body of thoracic vertebra ([place]) 09/10/2019  . Fall at home, initial encounter 09/10/2019  . AKI (acute kidney injury) ([place]) 01/17/2018  . Syncope, vasovagal 01/17/2018  . Diabetes mellitus type II, non insulin dependent ([place]) 01/17/2018  . Hyponatremia 01/17/2018  . Syncope 01/17/2018  . Depression 10/25/2015  . Hyperlipidemia 10/25/2015  . TINEA PEDIS 01/04/2010  . TINEA VERSICOLOR 01/04/2010  . URI 01/04/2010  . HYPERGLYCEMIA 12/02/2008  . ABDOMINAL WALL HERNIA 12/01/2007  . OBESITY, MORBID 04/04/2007  . SLEEP APNEA, OBSTRUCTIVE 04/04/2007  . LOW BACK PAIN, CHRONIC  12/07/2004  . HYPERLIPIDEMIA 08/12/2002  . ALLERGIC RHINITIS, SEASONAL 08/12/2002  . Essential hypertension 04/01/2000  . Asthma 10/11/1997  . SUPERFICIAL PHLEBITIS 05/06/1990   PCP:  Benito Mccreedy, MD Pharmacy:   [id number] [place] [address] Phone: [phone] Fax: Gerster Rotan, [address] AT [place] [address] Phone: [phone] Fax: [phone]     Social Determinants of Health (SDOH) Interventions    Readmission Risk Interventions No flowsheet data found.

## 2019-09-12 NOTE — Progress Notes (Signed)
Pt. Refused orthostatic vital signs (standing up) d/t pain.

## 2019-09-12 NOTE — Plan of Care (Signed)
  Problem: Education: Goal: Knowledge of General Education information will improve Description: Including pain rating scale, medication(s)/side effects and non-pharmacologic comfort measures Outcome: Progressing   Problem: Pain Managment: Goal: General experience of comfort will improve 09/12/2019 0830 by Melina Schools, RN Outcome: Progressing 09/12/2019 0828 by Melina Schools, RN Outcome: Progressing   Problem: Safety: Goal: Ability to remain free from injury will improve 09/12/2019 0830 by Melina Schools, RN Outcome: Progressing 09/12/2019 0828 by Melina Schools, RN Outcome: Progressing

## 2019-09-13 LAB — CBC
HCT: 30.5 % — ABNORMAL LOW (ref 39.0–52.0)
Hemoglobin: 9.5 g/dL — ABNORMAL LOW (ref 13.0–17.0)
MCH: 26 pg (ref 26.0–34.0)
MCHC: 31.1 g/dL (ref 30.0–36.0)
MCV: 83.3 fL (ref 80.0–100.0)
Platelets: 483 10*3/uL — ABNORMAL HIGH (ref 150–400)
RBC: 3.66 MIL/uL — ABNORMAL LOW (ref 4.22–5.81)
RDW: 14.6 % (ref 11.5–15.5)
WBC: 9.1 10*3/uL (ref 4.0–10.5)
nRBC: 0 % (ref 0.0–0.2)

## 2019-09-13 LAB — RENAL FUNCTION PANEL
Albumin: 2.5 g/dL — ABNORMAL LOW (ref 3.5–5.0)
Anion gap: 7 (ref 5–15)
BUN: 14 mg/dL (ref 8–23)
CO2: 27 mmol/L (ref 22–32)
Calcium: 8.7 mg/dL — ABNORMAL LOW (ref 8.9–10.3)
Chloride: 102 mmol/L (ref 98–111)
Creatinine, Ser: 1.08 mg/dL (ref 0.61–1.24)
GFR calc Af Amer: >60 mL/min (ref >60)
GFR calc non Af Amer: >60 mL/min (ref >60)
Glucose, Bld: 116 mg/dL — ABNORMAL HIGH (ref 70–99)
Phosphorus: 3.2 mg/dL (ref 2.5–4.6)
Potassium: 3.9 mmol/L (ref 3.5–5.1)
Sodium: 136 mmol/L (ref 135–145)

## 2019-09-13 LAB — GLUCOSE, CAPILLARY
Glucose-Capillary: 119 mg/dL — ABNORMAL HIGH (ref 70–99)
Glucose-Capillary: 134 mg/dL — ABNORMAL HIGH (ref 70–99)
Glucose-Capillary: 113 mg/dL — ABNORMAL HIGH (ref 70–99)
Glucose-Capillary: 113 mg/dL — ABNORMAL HIGH (ref 70–99)

## 2019-09-13 LAB — CK: Total CK: 27 U/L — ABNORMAL LOW (ref 49–397)

## 2019-09-13 LAB — MAGNESIUM: Magnesium: 1.8 mg/dL (ref 1.7–2.4)

## 2019-09-13 LAB — ACTH STIMULATION, 3 TIME POINTS
Cortisol, 30 Min: 21.5 ug/dL
Cortisol, 60 Min: 21.3 ug/dL
Cortisol, Base: 22.7 ug/dL

## 2019-09-13 MED ORDER — MORPHINE SULFATE (PF) 2 MG/ML IV SOLN: 2.0000 mg | INTRAVENOUS | Status: DC | PRN

## 2019-09-13 MED ORDER — MIDODRINE HCL 5 MG PO TABS
5.0000 mg | ORAL_TABLET | Freq: Three times a day (TID) | ORAL | Status: DC
  Administered 2019-09-13 – 2019-09-14 (×5): 5 mg via ORAL
  Filled 2019-09-13 (×5): qty 1

## 2019-09-13 NOTE — Progress Notes (Signed)
PROGRESS NOTE  Brian Silva WGY:[id number] DOB: 07/25/1940   PCP: Benito Mccreedy, MD  Patient is from: Home. Lives alone. Uses walker at baseline.  DOA: 09/10/2019 LOS: 3  Brief Narrative / Interim history: 79 year old male with history of morbid obesity, chronic RF on oxygen, OSA, HTN, DM-2 and syncope presenting with lower back pain after syncopal fall at home. Patient felt lightheaded when he got up off his commode and fell on the floor. Not sure how long he was on the ground. When he woke up, he called EMS. When EMS arrived, hypotensive to 105/50. He reports uncontrolled hypertension with labile blood pressure. Blood pressure drops when he takes his medications, and elevated without meds.  In ED, CT head/cervical spine/thoracic/lumbar done. Mild T12 compression fracture noted. Admitted for pain control, therapy evaluation and syncope.  Syncope thought to be orthostatic.  Syncope work-up including echo, a.m. cortisol and ACTH stimulation test unrevealing.  Continued to have soft blood pressure despite holding home antihypertensive medications.  Started on midodrine.  Therapy recommended SNF.  Waiting on SNF bed.  Subjective: Seen and examined earlier this morning.  No major events overnight of this morning.  Reports 2 episodes of emesis this morning which looks like spits in emesis bag.  Emesis was nonbloody.  Denies nausea, abdominal pain, diarrhea, URI symptoms, chest pain or dyspnea.  Continues to endorse 7/10 back pain.  No new focal neuro symptoms in his legs.  No bowel or bladder issue.  Objective: Vitals:   09/12/19 1608 09/12/19 1947 09/13/19 0516 09/13/19 0807  BP: (!) 139/57 (!) 135/54 (!) 114/56 (!) 152/49  Pulse: 71 77 66 70  Resp: 20 18 17 14   Temp:  98.3 F (36.8 C) 97.9 F (36.6 C) 97.8 F (36.6 C)  TempSrc:  Oral Oral Oral  SpO2: 91% 94% 90% (!) 85%  Height:        Intake/Output Summary (Last 24 hours) at 09/13/2019 1405 Last data filed at 09/13/2019  0900 Gross per 24 hour  Intake --  Output 950 ml  Net -950 ml   There were no vitals filed for this visit.  Examination:  GENERAL: No apparent distress.  Nontoxic. HEENT: MMM.  Vision and hearing grossly intact.  NECK: Supple.  No apparent JVD.  RESP: On room air.  No IWOB.  Fair aeration bilaterally but limited exam due to body habitus.. CVS:  RRR. Heart sounds normal.  ABD/GI/GU: BS+. Abd soft, NTND.  MSK/EXT:  Moves extremities. No apparent deformity. No edema.  SKIN: no apparent skin lesion or wound NEURO: Awake, alert and oriented appropriately.  No apparent focal neuro deficit. PSYCH: Calm. Normal affect.  Procedures:  None  Microbiology summarized: COVID-19 PCR negative.  Assessment & Plan: Syncope-history suggestive for orthostatic hypotension. Could have autonomic dysregulation in the setting of diabetes.  Echo with normal EF and G1 DD.  A.m. cortisol on lower side of normal.  Repeat a.m. cortisol within normal.  TSH within normal.  Orthostatic vitals has been a challenge due to pain and body habitus. -Orthostatic vitals every shift once pain fairly controlled -Treat BP for standing blood pressures -Continue holding home antihypertensives -Wean opiates. -Apply TED hose. -Start midodrine 5 mg 3 times daily  Syncopal fall at home T12 compression fracture -Pain control with Tylenol, tramadol and oxycodone.  Discontinue morphine. -Bowel regimen -Fall precautions -TLSO brace while up with PT   Obstructive sleep apnea -Continue nightly CPAP  Controlled DM-2: On Metformin at home.  A1c 6.4%. Recent Labs  09/12/19 2130 09/13/19 0804 09/13/19 1146  GLUCAP 121* 119* 134*  -Continue SSI and statin.  Morbid obesity:Body mass index is 55.35 kg/m. -Recommend lifestyle change to lose weight -He could benefit from GLP-1 inhibitors  Essential hypertension with labile blood pressures-soft blood pressures, especially diastolic. -Continue holding BP meds. -We  will treat for standing blood pressure -Start midodrine.  Debility/physical deconditioning: Lives alone. Uses walker at baseline. -PT/OT eval-SNF  Goal of care: Patient with significant comorbidities as above. Discussed about CODE STATUS, and the pros and cons of CPR and intubation. I recommended against CPR or intubation in case of emergency but patient wishes to remain full code.          DVT prophylaxis: Subcu Lovenox Code Status: Full code Family Communication: Patient states he has no family member to update Status is: Inpatient  Remains inpatient appropriate because:Unsafe d/c plan.  Remains on IV fluids with soft blood pressures.  Needs evaluation for possible adrenal insufficiency.   Dispo: The patient is from: Home              Anticipated d/c is to: SNF              Anticipated d/c date is: 1 day              Patient currently is medically stable to d/c.        Consultants:  None   Sch Meds:  Scheduled Meds: . acetaminophen  1,000 mg Oral Q8H  . enoxaparin (LOVENOX) injection  40 mg Subcutaneous Daily  . insulin aspart  0-20 Units Subcutaneous TID WC  . insulin aspart  0-5 Units Subcutaneous QHS  . midodrine  5 mg Oral TID WC   Continuous Infusions:  PRN Meds:.morphine injection, ondansetron **OR** ondansetron (ZOFRAN) IV, traMADol  Antimicrobials: Anti-infectives (From admission, onward)   None       I have personally reviewed the following labs and images: CBC: Recent Labs  Lab 09/10/19 1710 09/10/19 1724 09/11/19 0601 09/12/19 0856 09/13/19 0649  WBC 13.2*  --  9.7 8.7 9.1  NEUTROABS 11.8*  --   --   --   --   HGB 11.2* 10.9* 10.5* 9.6* 9.5*  HCT 36.0* 32.0* 33.4* 31.2* 30.5*  MCV 83.9  --  82.9 83.0 83.3  PLT 507*  --  454* 454* 483*   BMP &GFR Recent Labs  Lab 09/10/19 1710 09/10/19 1724 09/11/19 0601 09/12/19 0856 09/13/19 0649  NA 139 138 138 135 136  K 4.2 4.0 3.5 3.6 3.9  CL 100 104 101 99 102  CO2 25  --  27 28 27    GLUCOSE 157* 153* 111* 117* 116*  BUN 20 22 20 21 14   CREATININE 1.52* 1.60* 1.39* 1.20 1.08  CALCIUM 9.4  --  8.9 8.7* 8.7*  MG  --   --   --  1.9 1.8  PHOS  --   --   --  3.7 3.2   CrCl cannot be calculated (Unknown ideal weight.). Liver & Pancreas: Recent Labs  Lab 09/10/19 1710 09/11/19 0601 09/12/19 0856 09/13/19 0649  AST 16 12*  --   --   ALT 13 12  --   --   ALKPHOS 98 86  --   --   BILITOT 0.8 0.9  --   --   PROT 6.9 6.3*  --   --   ALBUMIN 2.9* 2.6* 2.5* 2.5*   No results for input(s): LIPASE, AMYLASE in the last 168 hours.  No results for input(s): AMMONIA in the last 168 hours. Diabetic: Recent Labs    09/11/19 0601  HGBA1C 6.4*   Recent Labs  Lab 09/12/19 1526 09/12/19 1650 09/12/19 2130 09/13/19 0804 09/13/19 1146  GLUCAP 146* 153* 121* 119* 134*   Cardiac Enzymes: Recent Labs  Lab 09/10/19 1710 09/13/19 0649  CKTOTAL 36* 27*   No results for input(s): PROBNP in the last 8760 hours. Coagulation Profile: No results for input(s): INR, PROTIME in the last 168 hours. Thyroid Function Tests: Recent Labs    09/12/19 0856  TSH 3.338   Lipid Profile: No results for input(s): CHOL, HDL, LDLCALC, TRIG, CHOLHDL, LDLDIRECT in the last 72 hours. Anemia Panel: No results for input(s): VITAMINB12, FOLATE, FERRITIN, TIBC, IRON, RETICCTPCT in the last 72 hours. Urine analysis:    Component Value Date/Time   COLORURINE YELLOW 09/10/2019 2142   APPEARANCEUR HAZY (A) 09/10/2019 2142   LABSPEC 1.018 09/10/2019 2142   PHURINE 5.0 09/10/2019 2142   GLUCOSEU NEGATIVE 09/10/2019 2142   HGBUR NEGATIVE 09/10/2019 2142   HGBUR negative 01/04/2010 1409   BILIRUBINUR NEGATIVE 09/10/2019 2142   KETONESUR NEGATIVE 09/10/2019 2142   PROTEINUR NEGATIVE 09/10/2019 2142   UROBILINOGEN 0.2 03/30/2012 2227   NITRITE NEGATIVE 09/10/2019 2142   LEUKOCYTESUR NEGATIVE 09/10/2019 2142   Sepsis Labs: Invalid input(s): PROCALCITONIN, [place]  Microbiology: Recent  Results (from the past 240 hour(s))  SARS Coronavirus 2 by RT PCR (hospital order, performed in [facility] hospital lab) Nasopharyngeal Nasopharyngeal Swab     Status: None   Collection Time: 09/10/19  6:06 PM   Specimen: Nasopharyngeal Swab  Result Value Ref Range Status   SARS Coronavirus 2 NEGATIVE NEGATIVE Final    Comment: (NOTE) SARS-CoV-2 target nucleic acids are NOT DETECTED. The SARS-CoV-2 RNA is generally detectable in upper and lower respiratory specimens during the acute phase of infection. The lowest concentration of SARS-CoV-2 viral copies this assay can detect is 250 copies / mL. A negative result does not preclude SARS-CoV-2 infection and should not be used as the sole basis for treatment or other patient management decisions.  A negative result may occur with improper specimen collection / handling, submission of specimen other than nasopharyngeal swab, presence of viral mutation(s) within the areas targeted by this assay, and inadequate number of viral copies (<250 copies / mL). A negative result must be combined with clinical observations, patient history, and epidemiological information. Fact Sheet for Patients:   StrictlyIdeas.no Fact Sheet for Healthcare Providers: BankingDealers.co.za This test is not yet approved or cleared  by the [place] FDA and has been authorized for detection and/or diagnosis of SARS-CoV-2 by FDA under an Emergency Use Authorization (EUA).  This EUA will remain in effect (meaning this test can be used) for the duration of the COVID-19 declaration under Section 564(b)(1) of the Act, 21 U.S.C. section 360bbb-3(b)(1), unless the authorization is terminated or revoked sooner. Performed at [facility], [place] [address], [place], [place] [address]   Urine culture     Status: None   Collection Time: 09/10/19  9:46 PM   Specimen: Urine, Clean Catch  Result Value Ref Range Status    Specimen Description URINE, CLEAN CATCH  Final   Special Requests NONE  Final   Culture   Final    NO GROWTH Performed at [facility], [place] [address] [place], [place] [address]    Report Status 09/11/2019 FINAL  Final    Radiology Studies: No results found.  Keiyon Plack T. [place]  If 7PM-7AM, please contact night-coverage [email or web address] Password [facility] 09/13/2019, 2:05 PM

## 2019-09-13 NOTE — Plan of Care (Signed)
  Problem: Pain Managment: Goal: General experience of comfort will improve Outcome: Progressing   Problem: Safety: Goal: Ability to remain free from injury will improve Outcome: Progressing   Problem: Skin Integrity: Goal: Risk for impaired skin integrity will decrease Outcome: Progressing   

## 2019-09-13 NOTE — Progress Notes (Signed)
Orthopedic Tech Progress Note Patient Details:  Brian Silva Dec 21, 1940 [id number]  Patient ID: Brian Silva, male   DOB: 12-05-1940, 79 y.o.   MRN: [id number] I called the rn to ask if I could come apply the tlso. She said the pt was asleep and needed to sleep. I called the order into [facility].  Karolee Stamps 09/13/2019, 6:26 AM

## 2019-09-13 NOTE — Progress Notes (Signed)
Notified X. Blount of [facility] that pt was using his home albuterol inhaler. RN removed inhaler from patient since there is no order and informed pt that Dr would be notified. Stated he takes albuterol inhaler at home once a day and albuterol nebs as needed. RN will continue to monitor pt.

## 2019-09-13 NOTE — Plan of Care (Signed)
  Problem: Education: Goal: Knowledge of General Education information will improve Description: Including pain rating scale, medication(s)/side effects and non-pharmacologic comfort measures Outcome: Progressing   Problem: Pain Managment: Goal: General experience of comfort will improve Outcome: Progressing   Problem: Safety: Goal: Ability to remain free from injury will improve Outcome: Progressing   

## 2019-09-14 DIAGNOSIS — R2681 Unsteadiness on feet: Secondary | ICD-10-CM | POA: Diagnosis not present

## 2019-09-14 DIAGNOSIS — I509 Heart failure, unspecified: Secondary | ICD-10-CM | POA: Diagnosis not present

## 2019-09-14 DIAGNOSIS — R202 Paresthesia of skin: Secondary | ICD-10-CM | POA: Diagnosis not present

## 2019-09-14 DIAGNOSIS — J45909 Unspecified asthma, uncomplicated: Secondary | ICD-10-CM | POA: Diagnosis not present

## 2019-09-14 DIAGNOSIS — S22080D Wedge compression fracture of T11-T12 vertebra, subsequent encounter for fracture with routine healing: Secondary | ICD-10-CM | POA: Diagnosis not present

## 2019-09-14 DIAGNOSIS — G629 Polyneuropathy, unspecified: Secondary | ICD-10-CM | POA: Diagnosis not present

## 2019-09-14 DIAGNOSIS — G4733 Obstructive sleep apnea (adult) (pediatric): Secondary | ICD-10-CM | POA: Diagnosis not present

## 2019-09-14 DIAGNOSIS — E1169 Type 2 diabetes mellitus with other specified complication: Secondary | ICD-10-CM | POA: Diagnosis not present

## 2019-09-14 DIAGNOSIS — R41841 Cognitive communication deficit: Secondary | ICD-10-CM | POA: Diagnosis not present

## 2019-09-14 DIAGNOSIS — M6281 Muscle weakness (generalized): Secondary | ICD-10-CM | POA: Diagnosis not present

## 2019-09-14 DIAGNOSIS — E119 Type 2 diabetes mellitus without complications: Secondary | ICD-10-CM | POA: Diagnosis not present

## 2019-09-14 DIAGNOSIS — W19XXXA Unspecified fall, initial encounter: Secondary | ICD-10-CM | POA: Diagnosis not present

## 2019-09-14 DIAGNOSIS — Z9981 Dependence on supplemental oxygen: Secondary | ICD-10-CM | POA: Diagnosis not present

## 2019-09-14 DIAGNOSIS — Z03818 Encounter for observation for suspected exposure to other biological agents ruled out: Secondary | ICD-10-CM | POA: Diagnosis not present

## 2019-09-14 DIAGNOSIS — J9610 Chronic respiratory failure, unspecified whether with hypoxia or hypercapnia: Secondary | ICD-10-CM | POA: Diagnosis not present

## 2019-09-14 DIAGNOSIS — E1165 Type 2 diabetes mellitus with hyperglycemia: Secondary | ICD-10-CM | POA: Diagnosis not present

## 2019-09-14 DIAGNOSIS — Z7401 Bed confinement status: Secondary | ICD-10-CM | POA: Diagnosis not present

## 2019-09-14 DIAGNOSIS — S22088S Other fracture of T11-T12 vertebra, sequela: Secondary | ICD-10-CM | POA: Diagnosis not present

## 2019-09-14 DIAGNOSIS — F320 Major depressive disorder, single episode, mild: Secondary | ICD-10-CM | POA: Diagnosis not present

## 2019-09-14 DIAGNOSIS — I700 Atherosclerosis of aorta: Secondary | ICD-10-CM | POA: Diagnosis not present

## 2019-09-14 DIAGNOSIS — E785 Hyperlipidemia, unspecified: Secondary | ICD-10-CM | POA: Diagnosis not present

## 2019-09-14 DIAGNOSIS — S22000A Wedge compression fracture of unspecified thoracic vertebra, initial encounter for closed fracture: Secondary | ICD-10-CM

## 2019-09-14 DIAGNOSIS — Z20828 Contact with and (suspected) exposure to other viral communicable diseases: Secondary | ICD-10-CM | POA: Diagnosis not present

## 2019-09-14 DIAGNOSIS — R29898 Other symptoms and signs involving the musculoskeletal system: Secondary | ICD-10-CM | POA: Diagnosis not present

## 2019-09-14 DIAGNOSIS — M2550 Pain in unspecified joint: Secondary | ICD-10-CM | POA: Diagnosis not present

## 2019-09-14 DIAGNOSIS — Z23 Encounter for immunization: Secondary | ICD-10-CM | POA: Diagnosis not present

## 2019-09-14 DIAGNOSIS — I10 Essential (primary) hypertension: Secondary | ICD-10-CM | POA: Diagnosis not present

## 2019-09-14 DIAGNOSIS — R55 Syncope and collapse: Secondary | ICD-10-CM | POA: Diagnosis not present

## 2019-09-14 DIAGNOSIS — R0902 Hypoxemia: Secondary | ICD-10-CM | POA: Diagnosis not present

## 2019-09-14 LAB — SARS CORONAVIRUS 2 (TAT 6-24 HRS): SARS Coronavirus 2: NEGATIVE

## 2019-09-14 LAB — GLUCOSE, CAPILLARY
Glucose-Capillary: 101 mg/dL — ABNORMAL HIGH (ref 70–99)
Glucose-Capillary: 97 mg/dL (ref 70–99)
Glucose-Capillary: 106 mg/dL — ABNORMAL HIGH (ref 70–99)
Glucose-Capillary: 112 mg/dL — ABNORMAL HIGH (ref 70–99)

## 2019-09-14 LAB — ACTH: C206 ACTH: 40.7 pg/mL (ref 7.2–63.3)

## 2019-09-14 MED ORDER — MIDODRINE HCL 5 MG PO TABS: 5.0000 mg | ORAL_TABLET | Freq: Three times a day (TID) | ORAL | Status: DC

## 2019-09-14 MED ORDER — LISINOPRIL 5 MG PO TABS: 5.0000 mg | ORAL_TABLET | Freq: Every day | ORAL | 0 refills | Status: DC

## 2019-09-14 MED ORDER — ACETAMINOPHEN 500 MG PO TABS: 1000.0000 mg | ORAL_TABLET | Freq: Three times a day (TID) | ORAL | 0 refills | Status: AC

## 2019-09-14 MED ORDER — TRAMADOL HCL 50 MG PO TABS: 50.0000 mg | ORAL_TABLET | Freq: Three times a day (TID) | ORAL | 0 refills | Status: DC | PRN

## 2019-09-14 MED FILL — Perflutren Lipid Microsphere IV Susp 1.1 MG/ML: INTRAVENOUS | Qty: 10 | Status: AC

## 2019-09-14 NOTE — TOC CAGE-AID Note (Signed)
Transition of Care [facility]) - CAGE-AID Screening   Patient Details  Name: Brian Silva MRN: [id number] Date of Birth: 30-Jan-1941  Transition [facility]) CM/SW Contact:    Emeterio Reeve, [place] Phone Number: 09/14/2019, 10:54 AM   Clinical Narrative:  Pt denied alcohol use and substance use.   CAGE-AID Screening:    Have You Ever Felt You Ought to Cut Down on Your Drinking or Drug Use?: No Have People Annoyed You By Critizing Your Drinking Or Drug Use?: No Have You Felt Bad Or Guilty About Your Drinking Or Drug Use?: No Have You Ever Had a Drink or Used Drugs First Thing In The Morning to STeady Your Nerves or to Get Rid of a Hangover?: No CAGE-AID Score: 0  Substance Abuse Education Offered: No     Blima Ledger, [place] Social Worker [phone]

## 2019-09-14 NOTE — Progress Notes (Signed)
Pt to Discharge to [facility], report called to Nunzio Cory , nurse at the facility. IV removed, no complaints of pain. Awaiting PTAR for transport.

## 2019-09-14 NOTE — Plan of Care (Signed)
  Problem: Education: Goal: Knowledge of General Education information will improve Description: Including pain rating scale, medication(s)/side effects and non-pharmacologic comfort measures Outcome: Progressing   Problem: Clinical Measurements: Goal: Respiratory complications will improve Outcome: Progressing Note: On room air   Problem: Nutrition: Goal: Adequate nutrition will be maintained Outcome: Progressing   Problem: Coping: Goal: Level of anxiety will decrease Outcome: Progressing   Problem: Elimination: Goal: Will not experience complications related to urinary retention Outcome: Progressing   Problem: Pain Managment: Goal: General experience of comfort will improve Outcome: Progressing   Problem: Safety: Goal: Ability to remain free from injury will improve Outcome: Progressing

## 2019-09-14 NOTE — Progress Notes (Signed)
Patient has been D?C. Belonging sent with patient. Called [facility] and spoke with receptionist and informed them patient will be arriving soon.

## 2019-09-14 NOTE — TOC Transition Note (Signed)
Transition of Care [facility]) - CM/SW Discharge Note   Patient Details  Name: Brian Silva MRN: [id number] Date of Birth: [birth year]  [facility] CM/SW Contact:  Curlene Labrum, RN Phone Number: 09/14/2019, 9:57 AM   Clinical Narrative:    Case management met with the patient and offer him Medicare choice for SNF placement.  Patient did not have a preference but asked that [facility] and [facility] be called first due to Medicare grades of the facilities.  Called [facility] and spoke with Deirdre Pippins and she is checking on bed availability.  Patient would prefer a private room but does not have extra money to pay for additional cost of room.  Deirdre Pippins to call back after possible bed assignment.  Patient has not had the COVID vaccine.   Final next level of care: Skilled Nursing Facility Barriers to Discharge: Continued Medical Work up   Patient Goals and CMS Choice   CMS Medicare.gov Compare Post Acute Care list provided to:: Patient Choice offered to / list presented to : Patient  Discharge Placement           Discharge Plan and Services                                     Social Determinants of Health (SDOH) Interventions     Readmission Risk Interventions No flowsheet data found.

## 2019-09-14 NOTE — Discharge Summary (Signed)
Physician Discharge Summary  Brian Silva HWK:[id number] DOB: 1940/05/31 DOA: 09/10/2019  PCP: Benito Mccreedy, MD  Admit date: 09/10/2019 Discharge date: 09/14/2019  Time spent: 45   Recommendations for Outpatient Follow-up:  1. note dosage changes of lisinopril given AKI on admission to a lower dose--- his lisinopril may have to be discontinued completely if it becomes orthostatic and I would recommend orthostatic checks as an outpatient at facility on admission 2. Recommend recheck Chem-7, CBC 1 week 3. Pain control first choice Tylenol second choice tramadol and please maintain TLSO at all times when awake and mobile 4. Please get A1c in about 2 months   Discharge Diagnoses:  Principal Problem:   Compression fracture of body of thoracic vertebra (HCC) Active Problems:   OBESITY, MORBID   SLEEP APNEA, OBSTRUCTIVE   Essential hypertension   Hyperlipidemia   Diabetes mellitus type II, non insulin dependent ([place])   Fall at home, initial encounter   Discharge Condition: Improved  Diet recommendation: Diabetic heart healthy  There were no vitals filed for this visit.  History of present illness:  24 white male with morbid obesity, chronic RF on oxygen, OSA, HTN, DM-2 and syncope presenting with lower back pain after syncopal fall at home. Patient felt lightheaded when he got up off his commode and fell on the floor. Not sure how long he was on the ground. When he woke up, he called EMS. When EMS arrived, hypotensive to 105/50. He reports uncontrolled hypertension with labile blood pressure. Blood pressure drops when he takes his medications, and elevated without meds.  In ED, CT head/cervical spine/thoracic/lumbar done. Mild T12 compression fracture noted.  Admitted for pain control, therapy evaluation and syncope.   Syncope thought to be orthostatic.  Syncope work-up including echo, a.m. cortisol and ACTH stimulation test unrevealing.  Continued to have soft blood pressure  despite holding home antihypertensive medications.  Started on midodrine this admission  Eventually was seen by therapy and felt to be a better candidate to go to rehab with his TLSO He had met maximal benefit on discharge--I added back a low-dose of lisinopril because of his diabetes (he had an A1c of 6.4 this admission   Discharge Exam: Vitals:   09/14/19 0301 09/14/19 0751  BP: (!) 135/51 (!) 154/65  Pulse: 67 62  Resp: 18 14  Temp: 98.5 F (36.9 C) (!) 97.5 F (36.4 C)  SpO2: 90% 93%    General: Awake alert coherent no distress tells me pain is much better than it was when he came in to the hospital Laying in bed Able to move 4 limbs equally Some lower extremity edema ROM intact to lower extremities No icterus no pallor Cardiovascular: S1-S2 no murmur rub or gallop Respiratory: Chest clear clinically no added sound Abdomen soft obese nontender no organomegaly neurologically intact  Discharge Instructions   Discharge Instructions    Diet - low sodium heart healthy   Complete by: As directed    Increase activity slowly   Complete by: As directed      Allergies as of 09/14/2019   No Known Allergies     Medication List    STOP taking these medications   meloxicam 7.5 MG tablet Commonly known as: MOBIC     TAKE these medications   acetaminophen 500 MG tablet Commonly known as: TYLENOL Take 2 tablets (1,000 mg total) by mouth every 8 (eight) hours.   albuterol 108 (90 Base) MCG/ACT inhaler Commonly known as: VENTOLIN HFA Inhale 2 puffs into the  lungs every 6 (six) hours as needed. For shortness of breath   albuterol (2.5 MG/3ML) 0.083% nebulizer solution Commonly known as: PROVENTIL Take 2.5 mg by nebulization every 6 (six) hours as needed for wheezing or shortness of breath.   amLODipine 10 MG tablet Commonly known as: NORVASC Take 10 mg by mouth daily.   aspirin EC 81 MG tablet Take 81 mg by mouth daily.   atorvastatin 20 MG tablet Commonly known  as: LIPITOR Take 20 mg by mouth at bedtime.   citalopram 20 MG tablet Commonly known as: CELEXA Take 20 mg by mouth daily.   fluticasone 50 MCG/ACT nasal spray Commonly known as: FLONASE Place 1 spray into both nostrils daily.   furosemide 80 MG tablet Commonly known as: LASIX Take 80 mg by mouth 2 (two) times daily.   lisinopril 5 MG tablet Commonly known as: ZESTRIL Take 1 tablet (5 mg total) by mouth daily. What changed:   medication strength  how much to take   metFORMIN 500 MG tablet Commonly known as: GLUCOPHAGE Take 500 mg by mouth 2 (two) times daily with a meal.   midodrine 5 MG tablet Commonly known as: PROAMATINE Take 1 tablet (5 mg total) by mouth 3 (three) times daily with meals.   traMADol 50 MG tablet Commonly known as: ULTRAM Take 1 tablet (50 mg total) by mouth every 8 (eight) hours as needed for moderate pain.      No Known Allergies    The results of significant diagnostics from this hospitalization (including imaging, microbiology, ancillary and laboratory) are listed below for reference.    Significant Diagnostic Studies: DG Tibia/Fibula Right  Result Date: 09/10/2019 CLINICAL DATA:  Right lower extremity pain. EXAM: RIGHT TIBIA AND FIBULA - 2 VIEW COMPARISON:  None. FINDINGS: There is no evidence of fracture or other focal bone lesions. Vascular calcifications are noted. IMPRESSION: Negative. Electronically Signed   By: Marijo Conception M.D.   On: 09/10/2019 16:42   CT Head Wo Contrast  Result Date: 09/10/2019 CLINICAL DATA:  79 year old male status post fall. Headache and back pain. EXAM: CT HEAD WITHOUT CONTRAST TECHNIQUE: Contiguous axial images were obtained from the base of the skull through the vertex without intravenous contrast. COMPARISON:  None. FINDINGS: Brain: Cerebral volume is within normal limits for age. No midline shift, ventriculomegaly, mass effect, evidence of mass lesion, intracranial hemorrhage or evidence of cortically  based acute infarction. Gray-white matter differentiation is within normal limits throughout the brain. Vascular: Calcified atherosclerosis at the skull base. No suspicious intracranial vascular hyperdensity. Skull: No fracture identified. Sinuses/Orbits: Opacified right mastoids. Right tympanic cavity remains clear. Other Visualized paranasal sinuses and mastoids are well pneumatized. Other: Mild posterior right occiput region scalp contusion on series 4, image 26. Underlying calvarium appears intact. No other orbit or scalp soft tissue injury identified. IMPRESSION: 1. Mild posterior right occiput region scalp contusion without underlying skull fracture. 2. Normal for age non contrast CT appearance of the brain. 3. Opacified right mastoid air cells, probably inflammatory. Electronically Signed   By: Genevie Ann M.D.   On: 09/10/2019 19:43   CT Cervical Spine Wo Contrast  Result Date: 09/10/2019 CLINICAL DATA:  79 year old male status post fall. Headache and back pain. EXAM: CT CERVICAL SPINE WITHOUT CONTRAST TECHNIQUE: Multidetector CT imaging of the cervical spine was performed without intravenous contrast. Multiplanar CT image reconstructions were also generated. COMPARISON:  Head CT today reported separately. FINDINGS: Alignment: Straightening of cervical lordosis. Cervicothoracic junction alignment is within normal limits.  Bilateral posterior element alignment is within normal limits. Skull base and vertebrae: Visualized skull base is intact. No atlanto-occipital dissociation. No acute osseous abnormality identified. Soft tissues and spinal canal: No prevertebral fluid or swelling. No visible canal hematoma. Calcified carotid atherosclerosis in the neck with a partially retropharyngeal course of both carotids. Disc levels: Widespread cervical disc and endplate degeneration. There is some ossification of the posterior longitudinal ligament (OPLL). Suspected in the lower cervical spine at C6 (sagittal image 57).  At least mild associated spinal stenosis. Upper chest: Visible upper thoracic levels appear intact. Negative lung apices. IMPRESSION: 1. No acute traumatic injury identified in the cervical spine. 2. Widespread cervical disc and endplate degeneration, with ossification of the posterior longitudinal ligament (OPLL) and at least mild spinal stenosis at C6. Electronically Signed   By: Genevie Ann M.D.   On: 09/10/2019 19:48   CT Thoracic Spine Wo Contrast  Result Date: 09/10/2019 CLINICAL DATA:  79 year old male status post fall. Headache and back pain. EXAM: CT THORACIC SPINE WITHOUT CONTRAST TECHNIQUE: Multidetector CT images of the thoracic were obtained using the standard protocol without intravenous contrast. COMPARISON:  Cervical spine CT today reported separately. Chest CT 10/26/2015. FINDINGS: Limited cervical spine imaging:  Reported separately today. Thoracic spine segmentation:  Normal. Alignment:  Stable thoracic kyphosis since 2017. Vertebrae: Several levels of interbody ankylosis via flowing endplate osteophytes, most notably T8-T9. There is subtle compression of the T12 superior endplate which is new since 2017 (series 7, image 61). Subtle endplate cortical fracture lucency is identified. No retropulsed bone or complicating features. No other acute osseous abnormality identified. Other thoracic vertebrae and visible ribs appear intact. Paraspinal and other soft tissues: Calcified aortic atherosclerosis. Small calcified left hilar lymph nodes. No pericardial or pleural effusion. Mild left lung base chronic scarring or atelectasis. Negative visible noncontrast upper abdominal viscera. Thoracic paraspinal soft tissues remain within normal limits. Disc levels: Generally mild for age thoracic spine degeneration, not definitely progressed since 2017. IMPRESSION: 1. Mild acute T12 superior endplate compression fracture. No retropulsed bone or complicating features. 2. No other acute traumatic injury identified  in the thoracic spine. 3. Aortic Atherosclerosis (ICD10-I70.0). Electronically Signed   By: Genevie Ann M.D.   On: 09/10/2019 19:54   CT Lumbar Spine Wo Contrast  Result Date: 09/10/2019 CLINICAL DATA:  79 year old male status post fall. Headache and back pain. EXAM: CT LUMBAR SPINE WITHOUT CONTRAST TECHNIQUE: Multidetector CT imaging of the lumbar spine was performed without intravenous contrast administration. Multiplanar CT image reconstructions were also generated. COMPARISON:  Thoracic spine CT today reported separately. CT Abdomen and Pelvis 10/06/2017. FINDINGS: Segmentation: Normal, concordant with the thoracic spine numbering today. Alignment: Straightening of lumbar lordosis is stable since 2019. There is subtle chronic anterolisthesis of L5 on S1. Vertebrae: Mild T12 superior endplate compression fracture again noted. There are chronic bilateral L5 pars fractures, unchanged since 2019. The L1 through L4 levels appears stable and intact. Visible sacrum and SI joints appear intact. Paraspinal and other soft tissues: Calcified aortic atherosclerosis. Stable visible noncontrast abdominal and pelvic viscera. Diverticulosis of the sigmoid colon. Negative lumbar paraspinal soft tissues. Disc levels: Multilevel advanced lumbar disc degeneration with vacuum disc. Multifactorial degenerative lumbar spinal stenosis appears maximal at L3-L4 and stable since 2019. IMPRESSION: 1. No acute traumatic injury identified in the lumbar spine. Chronic bilateral L5 pars fractures. 2. Chronically advanced lumbar spine degeneration stable since the 2019 CT. Electronically Signed   By: Genevie Ann M.D.   On: 09/10/2019 19:59  DG Chest Portable 1 View  Result Date: 09/10/2019 CLINICAL DATA:  Syncope. EXAM: PORTABLE CHEST 1 VIEW COMPARISON:  June 02, 2019 FINDINGS: There is no evidence of acute infiltrate, pleural effusion or pneumothorax. The cardiac silhouette is moderately enlarged and unchanged in size. There is mild  calcification of the aortic arch. The visualized skeletal structures are unremarkable. IMPRESSION: No active disease. Electronically Signed   By: Virgina Norfolk M.D.   On: 09/10/2019 16:43   ECHOCARDIOGRAM LIMITED  Result Date: 09/11/2019    ECHOCARDIOGRAM LIMITED REPORT   Patient Name:   Brian Silva Date of Exam: 09/11/2019 Medical Rec #:  [id number]        Height:       69.0 in Accession #:    [id number]       Weight:       374.8 lb Date of Birth:  04-12-1941        BSA:          2.698 m Patient Age:    24 years         BP:           111/51 mmHg Patient Gender: M                HR:           77 bpm. Exam Location:  Inpatient Procedure: 2D Echo, Cardiac Doppler, Color Doppler and Intracardiac            Opacification Agent Indications:    Syncope  History:        Patient has prior history of Echocardiogram examinations, most                 recent 01/17/2018. Risk Factors:Sleep Apnea, Hypertension,                 Dyslipidemia and Diabetes.  Sonographer:    Clayton Lefort RDCS (AE) Referring Phys: [id number] [facility]  Sonographer Comments: Technically challenging study due to limited acoustic windows, Technically difficult study due to poor echo windows, suboptimal parasternal window, suboptimal apical window and patient is morbidly obese. Image acquisition challenging due to patient body habitus. IMPRESSIONS  1. Left ventricular ejection fraction, by estimation, is 60 to 65%. The left ventricle has normal function. There is moderate left ventricular hypertrophy. Left ventricular diastolic parameters are consistent with Grade I diastolic dysfunction (impaired  relaxation).  2. Right ventricular systolic function is mildly reduced. The right ventricular size is mild-moderately enlarged. Tricuspid regurgitation signal is inadequate for assessing PA pressure.  3. The mitral valve is grossly normal. No evidence of mitral valve regurgitation.  4. The aortic valve is grossly normal. Aortic valve regurgitation is  not visualized. No aortic stenosis is present.  5. The inferior vena cava is normal in size with greater than 50% respiratory variability, suggesting right atrial pressure of 3 mmHg. FINDINGS  Left Ventricle: Left ventricular ejection fraction, by estimation, is 60 to 65%. The left ventricle has normal function. Definity contrast agent was given IV to delineate the left ventricular endocardial borders. There is moderate left ventricular hypertrophy. Right Ventricle: The right ventricular size is mild-moderately enlarged. Right ventricular systolic function is mildly reduced. Tricuspid regurgitation signal is inadequate for assessing PA pressure. Pericardium: Trivial pericardial effusion is present. The pericardial effusion is anterior to the right ventricle. Mitral Valve: The mitral valve is grossly normal. MV peak gradient, 6.8 mmHg. The mean mitral valve gradient is 3.0 mmHg. Aortic Valve: The aortic valve is grossly  normal. Aortic valve regurgitation is not visualized. No aortic stenosis is present. Aortic valve mean gradient measures 5.0 mmHg. Aortic valve peak gradient measures 10.1 mmHg. Aortic valve area, by VTI measures  3.15 cm. Pulmonic Valve: The pulmonic valve was not well visualized. Aorta: The aortic root is normal in size and structure. Venous: The inferior vena cava is normal in size with greater than 50% respiratory variability, suggesting right atrial pressure of 3 mmHg. IAS/Shunts: No atrial level shunt detected by color flow Doppler.  LEFT VENTRICLE PLAX 2D LVIDd:         4.60 cm  Diastology LVIDs:         2.90 cm  LV e' lateral:   8.27 cm/s LV PW:         1.40 cm  LV E/e' lateral: 13.8 LV IVS:        1.50 cm  LV e' medial:    6.42 cm/s LVOT diam:     2.40 cm  LV E/e' medial:  17.8 LV SV:         95 LV SV Index:   35 LVOT Area:     4.52 cm  IVC IVC diam: 1.70 cm LEFT ATRIUM         Index LA diam:    3.00 cm 1.11 cm/m  AORTIC VALVE AV Area (Vmax):    3.41 cm AV Area (Vmean):   3.62 cm AV Area  (VTI):     3.15 cm AV Vmax:           159.00 cm/s AV Vmean:          110.000 cm/s AV VTI:            0.302 m AV Peak Grad:      10.1 mmHg AV Mean Grad:      5.0 mmHg LVOT Vmax:         120.00 cm/s LVOT Vmean:        88.100 cm/s LVOT VTI:          0.210 m LVOT/AV VTI ratio: 0.70  AORTA Ao Root diam: 3.40 cm MITRAL VALVE MV Area (PHT): 3.12 cm     SHUNTS MV Peak grad:  6.8 mmHg     Systemic VTI:  0.21 m MV Mean grad:  3.0 mmHg     Systemic Diam: 2.40 cm MV Vmax:       1.30 m/s MV Vmean:      74.6 cm/s MV Decel Time: 243 msec MV E velocity: 114.00 cm/s MV A velocity: 136.00 cm/s MV E/A ratio:  0.84 Cherlynn Kaiser MD Electronically signed by Cherlynn Kaiser MD Signature Date/Time: 09/11/2019/9:10:37 PM    Final     Microbiology: Recent Results (from the past 240 hour(s))  SARS Coronavirus 2 by RT PCR (hospital order, performed in [place] hospital lab) Nasopharyngeal Nasopharyngeal Swab     Status: None   Collection Time: 09/10/19  6:06 PM   Specimen: Nasopharyngeal Swab  Result Value Ref Range Status   SARS Coronavirus 2 NEGATIVE NEGATIVE Final    Comment: (NOTE) SARS-CoV-2 target nucleic acids are NOT DETECTED. The SARS-CoV-2 RNA is generally detectable in upper and lower respiratory specimens during the acute phase of infection. The lowest concentration of SARS-CoV-2 viral copies this assay can detect is 250 copies / mL. A negative result does not preclude SARS-CoV-2 infection and should not be used as the sole basis for treatment or other patient management decisions.  A negative result may occur with improper  specimen collection / handling, submission of specimen other than nasopharyngeal swab, presence of viral mutation(s) within the areas targeted by this assay, and inadequate number of viral copies (<250 copies / mL). A negative result must be combined with clinical observations, patient history, and epidemiological information. Fact Sheet for Patients:    StrictlyIdeas.no Fact Sheet for Healthcare Providers: BankingDealers.co.za This test is not yet approved or cleared  by the [place] FDA and has been authorized for detection and/or diagnosis of SARS-CoV-2 by FDA under an Emergency Use Authorization (EUA).  This EUA will remain in effect (meaning this test can be used) for the duration of the COVID-19 declaration under Section 564(b)(1) of the Act, 21 U.S.C. section 360bbb-3(b)(1), unless the authorization is terminated or revoked sooner. Performed at [facility], [place] [address], [place], [place] [address]   Urine culture     Status: None   Collection Time: 09/10/19  9:46 PM   Specimen: Urine, Clean Catch  Result Value Ref Range Status   Specimen Description URINE, CLEAN CATCH  Final   Special Requests NONE  Final   Culture   Final    NO GROWTH Performed at [facility], [place] [address], [place], [place] [address]    Report Status 09/11/2019 FINAL  Final     Labs: Basic Metabolic Panel: Recent Labs  Lab 09/10/19 1710 09/10/19 1724 09/11/19 0601 09/12/19 0856 09/13/19 0649  NA 139 138 138 135 136  K 4.2 4.0 3.5 3.6 3.9  CL 100 104 101 99 102  CO2 25  --  '27 28 27  ' GLUCOSE 157* 153* 111* 117* 116*  BUN '20 22 20 21 14  ' CREATININE 1.52* 1.60* 1.39* 1.20 1.08  CALCIUM 9.4  --  8.9 8.7* 8.7*  MG  --   --   --  1.9 1.8  PHOS  --   --   --  3.7 3.2   Liver Function Tests: Recent Labs  Lab 09/10/19 1710 09/11/19 0601 09/12/19 0856 09/13/19 0649  AST 16 12*  --   --   ALT 13 12  --   --   ALKPHOS 98 86  --   --   BILITOT 0.8 0.9  --   --   PROT 6.9 6.3*  --   --   ALBUMIN 2.9* 2.6* 2.5* 2.5*   No results for input(s): LIPASE, AMYLASE in the last 168 hours. No results for input(s): AMMONIA in the last 168 hours. CBC: Recent Labs  Lab 09/10/19 1710 09/10/19 1724 09/11/19 0601 09/12/19 0856 09/13/19 0649  WBC 13.2*  --  9.7 8.7 9.1  NEUTROABS  11.8*  --   --   --   --   HGB 11.2* 10.9* 10.5* 9.6* 9.5*  HCT 36.0* 32.0* 33.4* 31.2* 30.5*  MCV 83.9  --  82.9 83.0 83.3  PLT 507*  --  454* 454* 483*   Cardiac Enzymes: Recent Labs  Lab 09/10/19 1710 09/13/19 0649  CKTOTAL 36* 27*   BNP: BNP (last 3 results) No results for input(s): BNP in the last 8760 hours.  ProBNP (last 3 results) No results for input(s): PROBNP in the last 8760 hours.  CBG: Recent Labs  Lab 09/13/19 0804 09/13/19 1146 09/13/19 1627 09/13/19 2055 09/14/19 0750  GLUCAP 119* 134* 113* 113* 101*       Signed:  Nita Sells MD   [facility] 09/14/2019, 11:25 AM

## 2019-09-16 DIAGNOSIS — S22000A Wedge compression fracture of unspecified thoracic vertebra, initial encounter for closed fracture: Secondary | ICD-10-CM | POA: Diagnosis not present

## 2019-09-16 DIAGNOSIS — J9610 Chronic respiratory failure, unspecified whether with hypoxia or hypercapnia: Secondary | ICD-10-CM | POA: Diagnosis not present

## 2019-09-16 DIAGNOSIS — E1165 Type 2 diabetes mellitus with hyperglycemia: Secondary | ICD-10-CM | POA: Diagnosis not present

## 2019-09-16 DIAGNOSIS — R55 Syncope and collapse: Secondary | ICD-10-CM | POA: Diagnosis not present

## 2019-09-17 ENCOUNTER — Other Ambulatory Visit: Payer: Self-pay | Admitting: *Deleted

## 2019-09-17 NOTE — Patient Outreach (Signed)
Late entry for 09/16/19  Screened for potential [facility] Care Management needs as a benefit of  [employer].  Member is receiving skilled therapy at [facility].  Writer attended telephonic interdisciplinary team meeting to assess for disposition needs and transition plan for resident.   Facility reports member is from home alone with little family involvement. She is max assist with therapy.   Will continue to follow for transition plans and potential [facility] needs.   [name], MSN-Ed, RN,BSN [place] Acute Care Coordinator [phone] [facility]) [phone]  (Toll free office)

## 2019-09-23 ENCOUNTER — Other Ambulatory Visit: Payer: Self-pay | Admitting: *Deleted

## 2019-09-23 NOTE — Patient Outreach (Signed)
Screened for potential [facility] Care Management needs as a benefit of  [employer].  Member is receiving skilled therapy at [facility].  Writer attended telephonic interdisciplinary team meeting to assess for disposition needs and transition plan for resident.   Facility reports member is from home alone. Still requires max assistance. Unable to ambulate. States goal is to return home. Facility has not spoke with member or support person about alternate plans as of yet.  Will continue to follow and make outreach as appropriate.   [name], MSN-Ed, RN,BSN [place] Acute Care Coordinator [phone] [facility]) [phone]  (Toll free office)

## 2019-09-26 DIAGNOSIS — Z03818 Encounter for observation for suspected exposure to other biological agents ruled out: Secondary | ICD-10-CM | POA: Diagnosis not present

## 2019-09-30 ENCOUNTER — Other Ambulatory Visit: Payer: Self-pay | Admitting: *Deleted

## 2019-09-30 DIAGNOSIS — Z20828 Contact with and (suspected) exposure to other viral communicable diseases: Secondary | ICD-10-CM | POA: Diagnosis not present

## 2019-09-30 NOTE — Patient Outreach (Signed)
Screened for potential [facility] Care Management needs as a benefit of  [employer].  [name] is currently receiving skilled therapy at [facility].   Writer attended telephonic interdisciplinary team meeting to assess for disposition needs and transition plan for resident.   Facility reports member is from home alone with friends that check in on him. Currently requires a lot of assistance with therapy per facility.   Facility has not discussed alternative plans with member.  Telephone call made to member, after IDT, at [phone] to discuss transition plans. No answer. HIPAA compliant voicemail message requesting return call.   Will continue to follow for transition plans while member resides in SNF.   [name], MSN-Ed, RN,BSN [place] Acute Care Coordinator [phone] [facility]) [phone]  (Toll free office)

## 2019-10-05 ENCOUNTER — Other Ambulatory Visit: Payer: Self-pay | Admitting: *Deleted

## 2019-10-05 NOTE — Patient Outreach (Signed)
[facility] Post- Acute Care Coordinator follow up  Brian Silva remains at [facility].   Telephone call again made to Brian Silva number on file [phone]. No answer. HIPAA compliant voicemail  message requesting return call.   Communication sent to facilty SW and voicemail message for covering dc planner to inquire about transition plans and confirm best contact number for Brian Silva.  [name], MSN-Ed, RN,BSN [place] Acute Care Coordinator [phone] [facility]) [phone]  (Toll free office)

## 2019-10-06 DIAGNOSIS — Z03818 Encounter for observation for suspected exposure to other biological agents ruled out: Secondary | ICD-10-CM | POA: Diagnosis not present

## 2019-10-09 DIAGNOSIS — R202 Paresthesia of skin: Secondary | ICD-10-CM | POA: Diagnosis not present

## 2019-10-09 DIAGNOSIS — Z9981 Dependence on supplemental oxygen: Secondary | ICD-10-CM | POA: Diagnosis not present

## 2019-10-09 DIAGNOSIS — I10 Essential (primary) hypertension: Secondary | ICD-10-CM | POA: Diagnosis not present

## 2019-10-09 DIAGNOSIS — F320 Major depressive disorder, single episode, mild: Secondary | ICD-10-CM | POA: Diagnosis not present

## 2019-10-09 DIAGNOSIS — E119 Type 2 diabetes mellitus without complications: Secondary | ICD-10-CM | POA: Diagnosis not present

## 2019-10-09 DIAGNOSIS — I509 Heart failure, unspecified: Secondary | ICD-10-CM | POA: Diagnosis not present

## 2019-10-14 DIAGNOSIS — G629 Polyneuropathy, unspecified: Secondary | ICD-10-CM | POA: Diagnosis not present

## 2019-10-14 DIAGNOSIS — I509 Heart failure, unspecified: Secondary | ICD-10-CM | POA: Diagnosis not present

## 2019-10-14 DIAGNOSIS — E119 Type 2 diabetes mellitus without complications: Secondary | ICD-10-CM | POA: Diagnosis not present

## 2019-10-14 DIAGNOSIS — I10 Essential (primary) hypertension: Secondary | ICD-10-CM | POA: Diagnosis not present

## 2019-10-14 DIAGNOSIS — R202 Paresthesia of skin: Secondary | ICD-10-CM | POA: Diagnosis not present

## 2019-10-19 ENCOUNTER — Other Ambulatory Visit: Payer: Self-pay | Admitting: *Deleted

## 2019-10-19 NOTE — Patient Outreach (Signed)
[place] Coordinator follow up  Member remains in [facility] receiving skilled therapy.  Telephone call made to Mr. Klingler [phone]. No answer. HIPAA compliant voicemail message left to request return call.   Communication sent to facility SW to inquire about better way to reach member to discuss [facility] services.   Will continue to follow for transition plans.    [name], MSN-Ed, RN,BSN [place] Acute Care Coordinator [phone] [facility]) [phone]  (Toll free office)

## 2019-10-20 DIAGNOSIS — S22088S Other fracture of T11-T12 vertebra, sequela: Secondary | ICD-10-CM | POA: Diagnosis not present

## 2019-10-20 DIAGNOSIS — Z9981 Dependence on supplemental oxygen: Secondary | ICD-10-CM | POA: Diagnosis not present

## 2019-10-20 DIAGNOSIS — G629 Polyneuropathy, unspecified: Secondary | ICD-10-CM | POA: Diagnosis not present

## 2019-10-20 DIAGNOSIS — E119 Type 2 diabetes mellitus without complications: Secondary | ICD-10-CM | POA: Diagnosis not present

## 2019-10-20 DIAGNOSIS — I10 Essential (primary) hypertension: Secondary | ICD-10-CM | POA: Diagnosis not present

## 2019-10-20 DIAGNOSIS — I509 Heart failure, unspecified: Secondary | ICD-10-CM | POA: Diagnosis not present

## 2019-10-21 ENCOUNTER — Other Ambulatory Visit: Payer: Self-pay | Admitting: *Deleted

## 2019-10-21 NOTE — Patient Outreach (Signed)
Screened for potential [facility] Care Management needs as a benefit of  [employer].  Brian Silva is currently receiving skilled therapy at [facility].   Writer attended telephonic interdisciplinary team meeting to assess for disposition needs and transition plan for resident.   Facility reports member remains adamant about returning home alone. Facility reports goal is to get member closer to independence with ADLs since plan is for home. He is currently mod/max with lower body dressing. Facility SW will discuss with member writer's attempts in reaching him.   Will continue to follow and plan outreach again to discuss transition plans.   [name], MSN-Ed, RN,BSN [place] Acute Care Coordinator [phone] [facility]) [phone]  (Toll free office)

## 2019-10-22 DIAGNOSIS — J9610 Chronic respiratory failure, unspecified whether with hypoxia or hypercapnia: Secondary | ICD-10-CM | POA: Diagnosis not present

## 2019-10-22 DIAGNOSIS — R2681 Unsteadiness on feet: Secondary | ICD-10-CM | POA: Diagnosis not present

## 2019-10-22 DIAGNOSIS — E785 Hyperlipidemia, unspecified: Secondary | ICD-10-CM | POA: Diagnosis not present

## 2019-10-22 DIAGNOSIS — E1169 Type 2 diabetes mellitus with other specified complication: Secondary | ICD-10-CM | POA: Diagnosis not present

## 2019-10-22 DIAGNOSIS — M6281 Muscle weakness (generalized): Secondary | ICD-10-CM | POA: Diagnosis not present

## 2019-10-22 DIAGNOSIS — J45909 Unspecified asthma, uncomplicated: Secondary | ICD-10-CM | POA: Diagnosis not present

## 2019-10-22 DIAGNOSIS — R41841 Cognitive communication deficit: Secondary | ICD-10-CM | POA: Diagnosis not present

## 2019-10-22 DIAGNOSIS — G4733 Obstructive sleep apnea (adult) (pediatric): Secondary | ICD-10-CM | POA: Diagnosis not present

## 2019-10-22 DIAGNOSIS — S22080D Wedge compression fracture of T11-T12 vertebra, subsequent encounter for fracture with routine healing: Secondary | ICD-10-CM | POA: Diagnosis not present

## 2019-10-22 DIAGNOSIS — I10 Essential (primary) hypertension: Secondary | ICD-10-CM | POA: Diagnosis not present

## 2019-10-22 DIAGNOSIS — I700 Atherosclerosis of aorta: Secondary | ICD-10-CM | POA: Diagnosis not present

## 2019-10-30 DIAGNOSIS — E1169 Type 2 diabetes mellitus with other specified complication: Secondary | ICD-10-CM | POA: Diagnosis not present

## 2019-10-30 DIAGNOSIS — M6281 Muscle weakness (generalized): Secondary | ICD-10-CM | POA: Diagnosis not present

## 2019-10-30 DIAGNOSIS — I10 Essential (primary) hypertension: Secondary | ICD-10-CM | POA: Diagnosis not present

## 2019-10-30 DIAGNOSIS — J45909 Unspecified asthma, uncomplicated: Secondary | ICD-10-CM | POA: Diagnosis not present

## 2019-10-30 DIAGNOSIS — R202 Paresthesia of skin: Secondary | ICD-10-CM | POA: Diagnosis not present

## 2019-10-30 DIAGNOSIS — J9610 Chronic respiratory failure, unspecified whether with hypoxia or hypercapnia: Secondary | ICD-10-CM | POA: Diagnosis not present

## 2019-10-30 DIAGNOSIS — E785 Hyperlipidemia, unspecified: Secondary | ICD-10-CM | POA: Diagnosis not present

## 2019-10-30 DIAGNOSIS — F320 Major depressive disorder, single episode, mild: Secondary | ICD-10-CM | POA: Diagnosis not present

## 2019-10-30 DIAGNOSIS — E119 Type 2 diabetes mellitus without complications: Secondary | ICD-10-CM | POA: Diagnosis not present

## 2019-10-30 DIAGNOSIS — I509 Heart failure, unspecified: Secondary | ICD-10-CM | POA: Diagnosis not present

## 2019-10-30 DIAGNOSIS — I700 Atherosclerosis of aorta: Secondary | ICD-10-CM | POA: Diagnosis not present

## 2019-10-30 DIAGNOSIS — S22088S Other fracture of T11-T12 vertebra, sequela: Secondary | ICD-10-CM | POA: Diagnosis not present

## 2019-10-30 DIAGNOSIS — S22080D Wedge compression fracture of T11-T12 vertebra, subsequent encounter for fracture with routine healing: Secondary | ICD-10-CM | POA: Diagnosis not present

## 2019-10-30 DIAGNOSIS — R2681 Unsteadiness on feet: Secondary | ICD-10-CM | POA: Diagnosis not present

## 2019-10-30 DIAGNOSIS — G629 Polyneuropathy, unspecified: Secondary | ICD-10-CM | POA: Diagnosis not present

## 2019-10-30 DIAGNOSIS — G4733 Obstructive sleep apnea (adult) (pediatric): Secondary | ICD-10-CM | POA: Diagnosis not present

## 2019-10-30 DIAGNOSIS — R41841 Cognitive communication deficit: Secondary | ICD-10-CM | POA: Diagnosis not present

## 2019-11-02 ENCOUNTER — Other Ambulatory Visit: Payer: Self-pay | Admitting: *Deleted

## 2019-11-02 NOTE — Patient Outreach (Signed)
[facility] Post- Acute Care Coordinator follow up.  Brian Silva remains in [facility] for skilled therapy.  Telephone call made to member [phone] to discuss transition plans and potential [facility] services. No answer. Went straight to [employer]. Left HIPAA compliant voicemail message to request return call. Note multiple attempts have been made to reach member.   Will outreach again to facility SW to request assistance to speak with Brian Silva.  Will continue to follow while for transition plans while Brian Silva remains in SNF.  [name], MSN-Ed, RN,BSN [place] Acute Care Coordinator [phone] [facility]) [phone]  (Toll free office)

## 2019-11-05 DIAGNOSIS — R41841 Cognitive communication deficit: Secondary | ICD-10-CM | POA: Diagnosis not present

## 2019-11-05 DIAGNOSIS — R2681 Unsteadiness on feet: Secondary | ICD-10-CM | POA: Diagnosis not present

## 2019-11-05 DIAGNOSIS — S22080D Wedge compression fracture of T11-T12 vertebra, subsequent encounter for fracture with routine healing: Secondary | ICD-10-CM | POA: Diagnosis not present

## 2019-11-05 DIAGNOSIS — M6281 Muscle weakness (generalized): Secondary | ICD-10-CM | POA: Diagnosis not present

## 2019-11-05 DIAGNOSIS — G4733 Obstructive sleep apnea (adult) (pediatric): Secondary | ICD-10-CM | POA: Diagnosis not present

## 2019-11-06 DIAGNOSIS — S22088S Other fracture of T11-T12 vertebra, sequela: Secondary | ICD-10-CM | POA: Diagnosis not present

## 2019-11-06 DIAGNOSIS — I509 Heart failure, unspecified: Secondary | ICD-10-CM | POA: Diagnosis not present

## 2019-11-06 DIAGNOSIS — G629 Polyneuropathy, unspecified: Secondary | ICD-10-CM | POA: Diagnosis not present

## 2019-11-06 DIAGNOSIS — Z9981 Dependence on supplemental oxygen: Secondary | ICD-10-CM | POA: Diagnosis not present

## 2019-11-06 DIAGNOSIS — I10 Essential (primary) hypertension: Secondary | ICD-10-CM | POA: Diagnosis not present

## 2019-11-06 DIAGNOSIS — E119 Type 2 diabetes mellitus without complications: Secondary | ICD-10-CM | POA: Diagnosis not present

## 2019-11-09 DIAGNOSIS — G4733 Obstructive sleep apnea (adult) (pediatric): Secondary | ICD-10-CM | POA: Diagnosis not present

## 2019-11-09 DIAGNOSIS — R2681 Unsteadiness on feet: Secondary | ICD-10-CM | POA: Diagnosis not present

## 2019-11-09 DIAGNOSIS — M6281 Muscle weakness (generalized): Secondary | ICD-10-CM | POA: Diagnosis not present

## 2019-11-09 DIAGNOSIS — R41841 Cognitive communication deficit: Secondary | ICD-10-CM | POA: Diagnosis not present

## 2019-11-09 DIAGNOSIS — S22080D Wedge compression fracture of T11-T12 vertebra, subsequent encounter for fracture with routine healing: Secondary | ICD-10-CM | POA: Diagnosis not present

## 2019-11-10 DIAGNOSIS — M6281 Muscle weakness (generalized): Secondary | ICD-10-CM | POA: Diagnosis not present

## 2019-11-10 DIAGNOSIS — S22080D Wedge compression fracture of T11-T12 vertebra, subsequent encounter for fracture with routine healing: Secondary | ICD-10-CM | POA: Diagnosis not present

## 2019-11-10 DIAGNOSIS — G4733 Obstructive sleep apnea (adult) (pediatric): Secondary | ICD-10-CM | POA: Diagnosis not present

## 2019-11-10 DIAGNOSIS — R41841 Cognitive communication deficit: Secondary | ICD-10-CM | POA: Diagnosis not present

## 2019-11-10 DIAGNOSIS — R2681 Unsteadiness on feet: Secondary | ICD-10-CM | POA: Diagnosis not present

## 2019-11-11 DIAGNOSIS — G4733 Obstructive sleep apnea (adult) (pediatric): Secondary | ICD-10-CM | POA: Diagnosis not present

## 2019-11-11 DIAGNOSIS — E1165 Type 2 diabetes mellitus with hyperglycemia: Secondary | ICD-10-CM | POA: Diagnosis not present

## 2019-11-11 DIAGNOSIS — R2681 Unsteadiness on feet: Secondary | ICD-10-CM | POA: Diagnosis not present

## 2019-11-11 DIAGNOSIS — S22000A Wedge compression fracture of unspecified thoracic vertebra, initial encounter for closed fracture: Secondary | ICD-10-CM | POA: Diagnosis not present

## 2019-11-11 DIAGNOSIS — M6281 Muscle weakness (generalized): Secondary | ICD-10-CM | POA: Diagnosis not present

## 2019-11-11 DIAGNOSIS — R41841 Cognitive communication deficit: Secondary | ICD-10-CM | POA: Diagnosis not present

## 2019-11-11 DIAGNOSIS — S22080D Wedge compression fracture of T11-T12 vertebra, subsequent encounter for fracture with routine healing: Secondary | ICD-10-CM | POA: Diagnosis not present

## 2019-11-11 DIAGNOSIS — J9610 Chronic respiratory failure, unspecified whether with hypoxia or hypercapnia: Secondary | ICD-10-CM | POA: Diagnosis not present

## 2019-11-11 DIAGNOSIS — R55 Syncope and collapse: Secondary | ICD-10-CM | POA: Diagnosis not present

## 2019-11-12 DIAGNOSIS — R41841 Cognitive communication deficit: Secondary | ICD-10-CM | POA: Diagnosis not present

## 2019-11-12 DIAGNOSIS — R2681 Unsteadiness on feet: Secondary | ICD-10-CM | POA: Diagnosis not present

## 2019-11-12 DIAGNOSIS — S22080D Wedge compression fracture of T11-T12 vertebra, subsequent encounter for fracture with routine healing: Secondary | ICD-10-CM | POA: Diagnosis not present

## 2019-11-12 DIAGNOSIS — M6281 Muscle weakness (generalized): Secondary | ICD-10-CM | POA: Diagnosis not present

## 2019-11-12 DIAGNOSIS — G4733 Obstructive sleep apnea (adult) (pediatric): Secondary | ICD-10-CM | POA: Diagnosis not present

## 2019-11-13 DIAGNOSIS — S22088S Other fracture of T11-T12 vertebra, sequela: Secondary | ICD-10-CM | POA: Diagnosis not present

## 2019-11-13 DIAGNOSIS — R2681 Unsteadiness on feet: Secondary | ICD-10-CM | POA: Diagnosis not present

## 2019-11-13 DIAGNOSIS — R202 Paresthesia of skin: Secondary | ICD-10-CM | POA: Diagnosis not present

## 2019-11-13 DIAGNOSIS — I509 Heart failure, unspecified: Secondary | ICD-10-CM | POA: Diagnosis not present

## 2019-11-13 DIAGNOSIS — G629 Polyneuropathy, unspecified: Secondary | ICD-10-CM | POA: Diagnosis not present

## 2019-11-13 DIAGNOSIS — S22080D Wedge compression fracture of T11-T12 vertebra, subsequent encounter for fracture with routine healing: Secondary | ICD-10-CM | POA: Diagnosis not present

## 2019-11-13 DIAGNOSIS — G4733 Obstructive sleep apnea (adult) (pediatric): Secondary | ICD-10-CM | POA: Diagnosis not present

## 2019-11-13 DIAGNOSIS — F320 Major depressive disorder, single episode, mild: Secondary | ICD-10-CM | POA: Diagnosis not present

## 2019-11-13 DIAGNOSIS — R41841 Cognitive communication deficit: Secondary | ICD-10-CM | POA: Diagnosis not present

## 2019-11-13 DIAGNOSIS — E119 Type 2 diabetes mellitus without complications: Secondary | ICD-10-CM | POA: Diagnosis not present

## 2019-11-13 DIAGNOSIS — I10 Essential (primary) hypertension: Secondary | ICD-10-CM | POA: Diagnosis not present

## 2019-11-13 DIAGNOSIS — M6281 Muscle weakness (generalized): Secondary | ICD-10-CM | POA: Diagnosis not present

## 2019-11-16 DIAGNOSIS — G4733 Obstructive sleep apnea (adult) (pediatric): Secondary | ICD-10-CM | POA: Diagnosis not present

## 2019-11-16 DIAGNOSIS — S22080D Wedge compression fracture of T11-T12 vertebra, subsequent encounter for fracture with routine healing: Secondary | ICD-10-CM | POA: Diagnosis not present

## 2019-11-16 DIAGNOSIS — R2681 Unsteadiness on feet: Secondary | ICD-10-CM | POA: Diagnosis not present

## 2019-11-16 DIAGNOSIS — M6281 Muscle weakness (generalized): Secondary | ICD-10-CM | POA: Diagnosis not present

## 2019-11-16 DIAGNOSIS — R41841 Cognitive communication deficit: Secondary | ICD-10-CM | POA: Diagnosis not present

## 2019-11-17 DIAGNOSIS — G4733 Obstructive sleep apnea (adult) (pediatric): Secondary | ICD-10-CM | POA: Diagnosis not present

## 2019-11-17 DIAGNOSIS — M6281 Muscle weakness (generalized): Secondary | ICD-10-CM | POA: Diagnosis not present

## 2019-11-17 DIAGNOSIS — S22080D Wedge compression fracture of T11-T12 vertebra, subsequent encounter for fracture with routine healing: Secondary | ICD-10-CM | POA: Diagnosis not present

## 2019-11-17 DIAGNOSIS — Z23 Encounter for immunization: Secondary | ICD-10-CM | POA: Diagnosis not present

## 2019-11-17 DIAGNOSIS — R2681 Unsteadiness on feet: Secondary | ICD-10-CM | POA: Diagnosis not present

## 2019-11-17 DIAGNOSIS — R41841 Cognitive communication deficit: Secondary | ICD-10-CM | POA: Diagnosis not present

## 2019-11-18 ENCOUNTER — Other Ambulatory Visit: Payer: Self-pay | Admitting: *Deleted

## 2019-11-18 DIAGNOSIS — R2681 Unsteadiness on feet: Secondary | ICD-10-CM | POA: Diagnosis not present

## 2019-11-18 DIAGNOSIS — R41841 Cognitive communication deficit: Secondary | ICD-10-CM | POA: Diagnosis not present

## 2019-11-18 DIAGNOSIS — M6281 Muscle weakness (generalized): Secondary | ICD-10-CM | POA: Diagnosis not present

## 2019-11-18 DIAGNOSIS — G4733 Obstructive sleep apnea (adult) (pediatric): Secondary | ICD-10-CM | POA: Diagnosis not present

## 2019-11-18 DIAGNOSIS — Z03818 Encounter for observation for suspected exposure to other biological agents ruled out: Secondary | ICD-10-CM | POA: Diagnosis not present

## 2019-11-18 DIAGNOSIS — S22080D Wedge compression fracture of T11-T12 vertebra, subsequent encounter for fracture with routine healing: Secondary | ICD-10-CM | POA: Diagnosis not present

## 2019-11-18 NOTE — Patient Outreach (Signed)
[place] Coordinator follow up  Member screened for potential [facility] Care Management needs as a benefit of [place] Medicare.  Mr. Mcclune remains at [facility] under private pay/Medicaid pending.   No identifiable [facility] needs at this time.   [name], MSN-Ed, RN,BSN [place] Acute Care Coordinator [phone] [facility]) [phone]  (Toll free office)

## 2019-11-19 DIAGNOSIS — M6281 Muscle weakness (generalized): Secondary | ICD-10-CM | POA: Diagnosis not present

## 2019-11-19 DIAGNOSIS — S22080D Wedge compression fracture of T11-T12 vertebra, subsequent encounter for fracture with routine healing: Secondary | ICD-10-CM | POA: Diagnosis not present

## 2019-11-19 DIAGNOSIS — R41841 Cognitive communication deficit: Secondary | ICD-10-CM | POA: Diagnosis not present

## 2019-11-19 DIAGNOSIS — G4733 Obstructive sleep apnea (adult) (pediatric): Secondary | ICD-10-CM | POA: Diagnosis not present

## 2019-11-19 DIAGNOSIS — R2681 Unsteadiness on feet: Secondary | ICD-10-CM | POA: Diagnosis not present

## 2019-11-20 DIAGNOSIS — F320 Major depressive disorder, single episode, mild: Secondary | ICD-10-CM | POA: Diagnosis not present

## 2019-11-20 DIAGNOSIS — G629 Polyneuropathy, unspecified: Secondary | ICD-10-CM | POA: Diagnosis not present

## 2019-11-20 DIAGNOSIS — S22088S Other fracture of T11-T12 vertebra, sequela: Secondary | ICD-10-CM | POA: Diagnosis not present

## 2019-11-20 DIAGNOSIS — E119 Type 2 diabetes mellitus without complications: Secondary | ICD-10-CM | POA: Diagnosis not present

## 2019-11-20 DIAGNOSIS — I10 Essential (primary) hypertension: Secondary | ICD-10-CM | POA: Diagnosis not present

## 2019-11-20 DIAGNOSIS — I509 Heart failure, unspecified: Secondary | ICD-10-CM | POA: Diagnosis not present

## 2019-11-20 DIAGNOSIS — Z9981 Dependence on supplemental oxygen: Secondary | ICD-10-CM | POA: Diagnosis not present

## 2019-11-23 DIAGNOSIS — Z03818 Encounter for observation for suspected exposure to other biological agents ruled out: Secondary | ICD-10-CM | POA: Diagnosis not present

## 2019-11-25 DIAGNOSIS — I700 Atherosclerosis of aorta: Secondary | ICD-10-CM | POA: Diagnosis not present

## 2019-11-25 DIAGNOSIS — M6281 Muscle weakness (generalized): Secondary | ICD-10-CM | POA: Diagnosis not present

## 2019-11-25 DIAGNOSIS — E1169 Type 2 diabetes mellitus with other specified complication: Secondary | ICD-10-CM | POA: Diagnosis not present

## 2019-11-25 DIAGNOSIS — J9610 Chronic respiratory failure, unspecified whether with hypoxia or hypercapnia: Secondary | ICD-10-CM | POA: Diagnosis not present

## 2019-11-25 DIAGNOSIS — J45909 Unspecified asthma, uncomplicated: Secondary | ICD-10-CM | POA: Diagnosis not present

## 2019-11-25 DIAGNOSIS — I10 Essential (primary) hypertension: Secondary | ICD-10-CM | POA: Diagnosis not present

## 2019-11-25 DIAGNOSIS — R2681 Unsteadiness on feet: Secondary | ICD-10-CM | POA: Diagnosis not present

## 2019-11-25 DIAGNOSIS — E785 Hyperlipidemia, unspecified: Secondary | ICD-10-CM | POA: Diagnosis not present

## 2019-11-25 DIAGNOSIS — R41841 Cognitive communication deficit: Secondary | ICD-10-CM | POA: Diagnosis not present

## 2019-11-25 DIAGNOSIS — G4733 Obstructive sleep apnea (adult) (pediatric): Secondary | ICD-10-CM | POA: Diagnosis not present

## 2019-11-26 DIAGNOSIS — G4733 Obstructive sleep apnea (adult) (pediatric): Secondary | ICD-10-CM | POA: Diagnosis not present

## 2019-11-26 DIAGNOSIS — E119 Type 2 diabetes mellitus without complications: Secondary | ICD-10-CM | POA: Diagnosis not present

## 2019-11-26 DIAGNOSIS — E785 Hyperlipidemia, unspecified: Secondary | ICD-10-CM | POA: Diagnosis not present

## 2019-11-26 DIAGNOSIS — J45909 Unspecified asthma, uncomplicated: Secondary | ICD-10-CM | POA: Diagnosis not present

## 2019-11-26 DIAGNOSIS — F320 Major depressive disorder, single episode, mild: Secondary | ICD-10-CM | POA: Diagnosis not present

## 2019-11-26 DIAGNOSIS — M25531 Pain in right wrist: Secondary | ICD-10-CM | POA: Diagnosis not present

## 2019-11-26 DIAGNOSIS — I10 Essential (primary) hypertension: Secondary | ICD-10-CM | POA: Diagnosis not present

## 2019-11-26 DIAGNOSIS — Z9981 Dependence on supplemental oxygen: Secondary | ICD-10-CM | POA: Diagnosis not present

## 2019-11-26 DIAGNOSIS — R202 Paresthesia of skin: Secondary | ICD-10-CM | POA: Diagnosis not present

## 2019-11-26 DIAGNOSIS — G629 Polyneuropathy, unspecified: Secondary | ICD-10-CM | POA: Diagnosis not present

## 2019-11-26 DIAGNOSIS — R2681 Unsteadiness on feet: Secondary | ICD-10-CM | POA: Diagnosis not present

## 2019-11-26 DIAGNOSIS — I509 Heart failure, unspecified: Secondary | ICD-10-CM | POA: Diagnosis not present

## 2019-11-26 DIAGNOSIS — S22088S Other fracture of T11-T12 vertebra, sequela: Secondary | ICD-10-CM | POA: Diagnosis not present

## 2019-11-26 DIAGNOSIS — R41841 Cognitive communication deficit: Secondary | ICD-10-CM | POA: Diagnosis not present

## 2019-11-26 DIAGNOSIS — Z20822 Contact with and (suspected) exposure to covid-19: Secondary | ICD-10-CM | POA: Diagnosis not present

## 2019-11-26 DIAGNOSIS — M79641 Pain in right hand: Secondary | ICD-10-CM | POA: Diagnosis not present

## 2019-11-26 DIAGNOSIS — M6281 Muscle weakness (generalized): Secondary | ICD-10-CM | POA: Diagnosis not present

## 2019-11-27 DIAGNOSIS — S22088S Other fracture of T11-T12 vertebra, sequela: Secondary | ICD-10-CM | POA: Diagnosis not present

## 2019-11-27 DIAGNOSIS — R2681 Unsteadiness on feet: Secondary | ICD-10-CM | POA: Diagnosis not present

## 2019-11-27 DIAGNOSIS — I509 Heart failure, unspecified: Secondary | ICD-10-CM | POA: Diagnosis not present

## 2019-11-27 DIAGNOSIS — I10 Essential (primary) hypertension: Secondary | ICD-10-CM | POA: Diagnosis not present

## 2019-11-27 DIAGNOSIS — E119 Type 2 diabetes mellitus without complications: Secondary | ICD-10-CM | POA: Diagnosis not present

## 2019-11-27 DIAGNOSIS — M25531 Pain in right wrist: Secondary | ICD-10-CM | POA: Diagnosis not present

## 2019-11-27 DIAGNOSIS — R41841 Cognitive communication deficit: Secondary | ICD-10-CM | POA: Diagnosis not present

## 2019-11-27 DIAGNOSIS — J45909 Unspecified asthma, uncomplicated: Secondary | ICD-10-CM | POA: Diagnosis not present

## 2019-11-27 DIAGNOSIS — M6281 Muscle weakness (generalized): Secondary | ICD-10-CM | POA: Diagnosis not present

## 2019-11-27 DIAGNOSIS — G4733 Obstructive sleep apnea (adult) (pediatric): Secondary | ICD-10-CM | POA: Diagnosis not present

## 2019-11-27 DIAGNOSIS — F320 Major depressive disorder, single episode, mild: Secondary | ICD-10-CM | POA: Diagnosis not present

## 2019-11-27 DIAGNOSIS — S63501A Unspecified sprain of right wrist, initial encounter: Secondary | ICD-10-CM | POA: Diagnosis not present

## 2019-11-27 DIAGNOSIS — G629 Polyneuropathy, unspecified: Secondary | ICD-10-CM | POA: Diagnosis not present

## 2019-11-27 DIAGNOSIS — R202 Paresthesia of skin: Secondary | ICD-10-CM | POA: Diagnosis not present

## 2019-11-29 DIAGNOSIS — G4733 Obstructive sleep apnea (adult) (pediatric): Secondary | ICD-10-CM | POA: Diagnosis not present

## 2019-11-29 DIAGNOSIS — J45909 Unspecified asthma, uncomplicated: Secondary | ICD-10-CM | POA: Diagnosis not present

## 2019-11-29 DIAGNOSIS — M6281 Muscle weakness (generalized): Secondary | ICD-10-CM | POA: Diagnosis not present

## 2019-11-29 DIAGNOSIS — R2681 Unsteadiness on feet: Secondary | ICD-10-CM | POA: Diagnosis not present

## 2019-11-29 DIAGNOSIS — R41841 Cognitive communication deficit: Secondary | ICD-10-CM | POA: Diagnosis not present

## 2019-11-30 DIAGNOSIS — Z20822 Contact with and (suspected) exposure to covid-19: Secondary | ICD-10-CM | POA: Diagnosis not present

## 2019-12-01 DIAGNOSIS — F320 Major depressive disorder, single episode, mild: Secondary | ICD-10-CM | POA: Diagnosis not present

## 2019-12-01 DIAGNOSIS — S63501A Unspecified sprain of right wrist, initial encounter: Secondary | ICD-10-CM | POA: Diagnosis not present

## 2019-12-01 DIAGNOSIS — E119 Type 2 diabetes mellitus without complications: Secondary | ICD-10-CM | POA: Diagnosis not present

## 2019-12-01 DIAGNOSIS — S22088S Other fracture of T11-T12 vertebra, sequela: Secondary | ICD-10-CM | POA: Diagnosis not present

## 2019-12-01 DIAGNOSIS — R41841 Cognitive communication deficit: Secondary | ICD-10-CM | POA: Diagnosis not present

## 2019-12-01 DIAGNOSIS — M6281 Muscle weakness (generalized): Secondary | ICD-10-CM | POA: Diagnosis not present

## 2019-12-01 DIAGNOSIS — J45909 Unspecified asthma, uncomplicated: Secondary | ICD-10-CM | POA: Diagnosis not present

## 2019-12-01 DIAGNOSIS — I509 Heart failure, unspecified: Secondary | ICD-10-CM | POA: Diagnosis not present

## 2019-12-01 DIAGNOSIS — R2681 Unsteadiness on feet: Secondary | ICD-10-CM | POA: Diagnosis not present

## 2019-12-01 DIAGNOSIS — Z9981 Dependence on supplemental oxygen: Secondary | ICD-10-CM | POA: Diagnosis not present

## 2019-12-01 DIAGNOSIS — M25531 Pain in right wrist: Secondary | ICD-10-CM | POA: Diagnosis not present

## 2019-12-01 DIAGNOSIS — I10 Essential (primary) hypertension: Secondary | ICD-10-CM | POA: Diagnosis not present

## 2019-12-01 DIAGNOSIS — G629 Polyneuropathy, unspecified: Secondary | ICD-10-CM | POA: Diagnosis not present

## 2019-12-01 DIAGNOSIS — G4733 Obstructive sleep apnea (adult) (pediatric): Secondary | ICD-10-CM | POA: Diagnosis not present

## 2019-12-02 DIAGNOSIS — R2681 Unsteadiness on feet: Secondary | ICD-10-CM | POA: Diagnosis not present

## 2019-12-02 DIAGNOSIS — R41841 Cognitive communication deficit: Secondary | ICD-10-CM | POA: Diagnosis not present

## 2019-12-02 DIAGNOSIS — J45909 Unspecified asthma, uncomplicated: Secondary | ICD-10-CM | POA: Diagnosis not present

## 2019-12-02 DIAGNOSIS — G4733 Obstructive sleep apnea (adult) (pediatric): Secondary | ICD-10-CM | POA: Diagnosis not present

## 2019-12-02 DIAGNOSIS — M6281 Muscle weakness (generalized): Secondary | ICD-10-CM | POA: Diagnosis not present

## 2019-12-03 DIAGNOSIS — M6281 Muscle weakness (generalized): Secondary | ICD-10-CM | POA: Diagnosis not present

## 2019-12-03 DIAGNOSIS — G4733 Obstructive sleep apnea (adult) (pediatric): Secondary | ICD-10-CM | POA: Diagnosis not present

## 2019-12-03 DIAGNOSIS — R41841 Cognitive communication deficit: Secondary | ICD-10-CM | POA: Diagnosis not present

## 2019-12-03 DIAGNOSIS — R2681 Unsteadiness on feet: Secondary | ICD-10-CM | POA: Diagnosis not present

## 2019-12-03 DIAGNOSIS — J45909 Unspecified asthma, uncomplicated: Secondary | ICD-10-CM | POA: Diagnosis not present

## 2019-12-03 DIAGNOSIS — Z20822 Contact with and (suspected) exposure to covid-19: Secondary | ICD-10-CM | POA: Diagnosis not present

## 2019-12-04 DIAGNOSIS — G4733 Obstructive sleep apnea (adult) (pediatric): Secondary | ICD-10-CM | POA: Diagnosis not present

## 2019-12-04 DIAGNOSIS — M6281 Muscle weakness (generalized): Secondary | ICD-10-CM | POA: Diagnosis not present

## 2019-12-04 DIAGNOSIS — R41841 Cognitive communication deficit: Secondary | ICD-10-CM | POA: Diagnosis not present

## 2019-12-04 DIAGNOSIS — R2681 Unsteadiness on feet: Secondary | ICD-10-CM | POA: Diagnosis not present

## 2019-12-04 DIAGNOSIS — J45909 Unspecified asthma, uncomplicated: Secondary | ICD-10-CM | POA: Diagnosis not present

## 2019-12-05 DIAGNOSIS — R41841 Cognitive communication deficit: Secondary | ICD-10-CM | POA: Diagnosis not present

## 2019-12-05 DIAGNOSIS — J45909 Unspecified asthma, uncomplicated: Secondary | ICD-10-CM | POA: Diagnosis not present

## 2019-12-05 DIAGNOSIS — G4733 Obstructive sleep apnea (adult) (pediatric): Secondary | ICD-10-CM | POA: Diagnosis not present

## 2019-12-05 DIAGNOSIS — M6281 Muscle weakness (generalized): Secondary | ICD-10-CM | POA: Diagnosis not present

## 2019-12-05 DIAGNOSIS — R2681 Unsteadiness on feet: Secondary | ICD-10-CM | POA: Diagnosis not present

## 2019-12-06 DIAGNOSIS — J45909 Unspecified asthma, uncomplicated: Secondary | ICD-10-CM | POA: Diagnosis not present

## 2019-12-06 DIAGNOSIS — R2681 Unsteadiness on feet: Secondary | ICD-10-CM | POA: Diagnosis not present

## 2019-12-06 DIAGNOSIS — R41841 Cognitive communication deficit: Secondary | ICD-10-CM | POA: Diagnosis not present

## 2019-12-06 DIAGNOSIS — G4733 Obstructive sleep apnea (adult) (pediatric): Secondary | ICD-10-CM | POA: Diagnosis not present

## 2019-12-06 DIAGNOSIS — M6281 Muscle weakness (generalized): Secondary | ICD-10-CM | POA: Diagnosis not present

## 2019-12-07 DIAGNOSIS — G629 Polyneuropathy, unspecified: Secondary | ICD-10-CM | POA: Diagnosis not present

## 2019-12-07 DIAGNOSIS — R202 Paresthesia of skin: Secondary | ICD-10-CM | POA: Diagnosis not present

## 2019-12-07 DIAGNOSIS — F320 Major depressive disorder, single episode, mild: Secondary | ICD-10-CM | POA: Diagnosis not present

## 2019-12-07 DIAGNOSIS — R2681 Unsteadiness on feet: Secondary | ICD-10-CM | POA: Diagnosis not present

## 2019-12-07 DIAGNOSIS — M25531 Pain in right wrist: Secondary | ICD-10-CM | POA: Diagnosis not present

## 2019-12-07 DIAGNOSIS — E119 Type 2 diabetes mellitus without complications: Secondary | ICD-10-CM | POA: Diagnosis not present

## 2019-12-07 DIAGNOSIS — I10 Essential (primary) hypertension: Secondary | ICD-10-CM | POA: Diagnosis not present

## 2019-12-07 DIAGNOSIS — R41841 Cognitive communication deficit: Secondary | ICD-10-CM | POA: Diagnosis not present

## 2019-12-07 DIAGNOSIS — G4733 Obstructive sleep apnea (adult) (pediatric): Secondary | ICD-10-CM | POA: Diagnosis not present

## 2019-12-07 DIAGNOSIS — J45909 Unspecified asthma, uncomplicated: Secondary | ICD-10-CM | POA: Diagnosis not present

## 2019-12-07 DIAGNOSIS — M6281 Muscle weakness (generalized): Secondary | ICD-10-CM | POA: Diagnosis not present

## 2019-12-07 DIAGNOSIS — S63501A Unspecified sprain of right wrist, initial encounter: Secondary | ICD-10-CM | POA: Diagnosis not present

## 2019-12-07 DIAGNOSIS — S22088S Other fracture of T11-T12 vertebra, sequela: Secondary | ICD-10-CM | POA: Diagnosis not present

## 2019-12-07 DIAGNOSIS — I509 Heart failure, unspecified: Secondary | ICD-10-CM | POA: Diagnosis not present

## 2019-12-07 DIAGNOSIS — Z9981 Dependence on supplemental oxygen: Secondary | ICD-10-CM | POA: Diagnosis not present

## 2019-12-08 DIAGNOSIS — G629 Polyneuropathy, unspecified: Secondary | ICD-10-CM | POA: Diagnosis not present

## 2019-12-08 DIAGNOSIS — E119 Type 2 diabetes mellitus without complications: Secondary | ICD-10-CM | POA: Diagnosis not present

## 2019-12-08 DIAGNOSIS — I10 Essential (primary) hypertension: Secondary | ICD-10-CM | POA: Diagnosis not present

## 2019-12-08 DIAGNOSIS — S63501A Unspecified sprain of right wrist, initial encounter: Secondary | ICD-10-CM | POA: Diagnosis not present

## 2019-12-08 DIAGNOSIS — I509 Heart failure, unspecified: Secondary | ICD-10-CM | POA: Diagnosis not present

## 2019-12-08 DIAGNOSIS — W19XXXA Unspecified fall, initial encounter: Secondary | ICD-10-CM | POA: Diagnosis not present

## 2019-12-08 DIAGNOSIS — R2681 Unsteadiness on feet: Secondary | ICD-10-CM | POA: Diagnosis not present

## 2019-12-08 DIAGNOSIS — J45909 Unspecified asthma, uncomplicated: Secondary | ICD-10-CM | POA: Diagnosis not present

## 2019-12-08 DIAGNOSIS — S22088S Other fracture of T11-T12 vertebra, sequela: Secondary | ICD-10-CM | POA: Diagnosis not present

## 2019-12-08 DIAGNOSIS — M25531 Pain in right wrist: Secondary | ICD-10-CM | POA: Diagnosis not present

## 2019-12-08 DIAGNOSIS — F320 Major depressive disorder, single episode, mild: Secondary | ICD-10-CM | POA: Diagnosis not present

## 2019-12-08 DIAGNOSIS — G4733 Obstructive sleep apnea (adult) (pediatric): Secondary | ICD-10-CM | POA: Diagnosis not present

## 2019-12-08 DIAGNOSIS — R202 Paresthesia of skin: Secondary | ICD-10-CM | POA: Diagnosis not present

## 2019-12-08 DIAGNOSIS — R41841 Cognitive communication deficit: Secondary | ICD-10-CM | POA: Diagnosis not present

## 2019-12-08 DIAGNOSIS — M6281 Muscle weakness (generalized): Secondary | ICD-10-CM | POA: Diagnosis not present

## 2019-12-09 DIAGNOSIS — R2681 Unsteadiness on feet: Secondary | ICD-10-CM | POA: Diagnosis not present

## 2019-12-09 DIAGNOSIS — R41841 Cognitive communication deficit: Secondary | ICD-10-CM | POA: Diagnosis not present

## 2019-12-09 DIAGNOSIS — G4733 Obstructive sleep apnea (adult) (pediatric): Secondary | ICD-10-CM | POA: Diagnosis not present

## 2019-12-09 DIAGNOSIS — M6281 Muscle weakness (generalized): Secondary | ICD-10-CM | POA: Diagnosis not present

## 2019-12-09 DIAGNOSIS — J45909 Unspecified asthma, uncomplicated: Secondary | ICD-10-CM | POA: Diagnosis not present

## 2019-12-10 DIAGNOSIS — R2681 Unsteadiness on feet: Secondary | ICD-10-CM | POA: Diagnosis not present

## 2019-12-10 DIAGNOSIS — M6281 Muscle weakness (generalized): Secondary | ICD-10-CM | POA: Diagnosis not present

## 2019-12-10 DIAGNOSIS — G4733 Obstructive sleep apnea (adult) (pediatric): Secondary | ICD-10-CM | POA: Diagnosis not present

## 2019-12-10 DIAGNOSIS — J45909 Unspecified asthma, uncomplicated: Secondary | ICD-10-CM | POA: Diagnosis not present

## 2019-12-10 DIAGNOSIS — R41841 Cognitive communication deficit: Secondary | ICD-10-CM | POA: Diagnosis not present

## 2019-12-11 DIAGNOSIS — M6281 Muscle weakness (generalized): Secondary | ICD-10-CM | POA: Diagnosis not present

## 2019-12-11 DIAGNOSIS — J45909 Unspecified asthma, uncomplicated: Secondary | ICD-10-CM | POA: Diagnosis not present

## 2019-12-11 DIAGNOSIS — G4733 Obstructive sleep apnea (adult) (pediatric): Secondary | ICD-10-CM | POA: Diagnosis not present

## 2019-12-11 DIAGNOSIS — R41841 Cognitive communication deficit: Secondary | ICD-10-CM | POA: Diagnosis not present

## 2019-12-11 DIAGNOSIS — R2681 Unsteadiness on feet: Secondary | ICD-10-CM | POA: Diagnosis not present

## 2019-12-12 DIAGNOSIS — M6281 Muscle weakness (generalized): Secondary | ICD-10-CM | POA: Diagnosis not present

## 2019-12-12 DIAGNOSIS — R2681 Unsteadiness on feet: Secondary | ICD-10-CM | POA: Diagnosis not present

## 2019-12-12 DIAGNOSIS — G4733 Obstructive sleep apnea (adult) (pediatric): Secondary | ICD-10-CM | POA: Diagnosis not present

## 2019-12-12 DIAGNOSIS — J45909 Unspecified asthma, uncomplicated: Secondary | ICD-10-CM | POA: Diagnosis not present

## 2019-12-12 DIAGNOSIS — R41841 Cognitive communication deficit: Secondary | ICD-10-CM | POA: Diagnosis not present

## 2019-12-13 DIAGNOSIS — G4733 Obstructive sleep apnea (adult) (pediatric): Secondary | ICD-10-CM | POA: Diagnosis not present

## 2019-12-13 DIAGNOSIS — R2681 Unsteadiness on feet: Secondary | ICD-10-CM | POA: Diagnosis not present

## 2019-12-13 DIAGNOSIS — R41841 Cognitive communication deficit: Secondary | ICD-10-CM | POA: Diagnosis not present

## 2019-12-13 DIAGNOSIS — M6281 Muscle weakness (generalized): Secondary | ICD-10-CM | POA: Diagnosis not present

## 2019-12-13 DIAGNOSIS — J45909 Unspecified asthma, uncomplicated: Secondary | ICD-10-CM | POA: Diagnosis not present

## 2019-12-14 DIAGNOSIS — M6281 Muscle weakness (generalized): Secondary | ICD-10-CM | POA: Diagnosis not present

## 2019-12-14 DIAGNOSIS — J45909 Unspecified asthma, uncomplicated: Secondary | ICD-10-CM | POA: Diagnosis not present

## 2019-12-14 DIAGNOSIS — R2681 Unsteadiness on feet: Secondary | ICD-10-CM | POA: Diagnosis not present

## 2019-12-14 DIAGNOSIS — G4733 Obstructive sleep apnea (adult) (pediatric): Secondary | ICD-10-CM | POA: Diagnosis not present

## 2019-12-14 DIAGNOSIS — R41841 Cognitive communication deficit: Secondary | ICD-10-CM | POA: Diagnosis not present

## 2019-12-15 DIAGNOSIS — Z20828 Contact with and (suspected) exposure to other viral communicable diseases: Secondary | ICD-10-CM | POA: Diagnosis not present

## 2019-12-15 DIAGNOSIS — R2681 Unsteadiness on feet: Secondary | ICD-10-CM | POA: Diagnosis not present

## 2019-12-15 DIAGNOSIS — J45909 Unspecified asthma, uncomplicated: Secondary | ICD-10-CM | POA: Diagnosis not present

## 2019-12-15 DIAGNOSIS — G4733 Obstructive sleep apnea (adult) (pediatric): Secondary | ICD-10-CM | POA: Diagnosis not present

## 2019-12-15 DIAGNOSIS — M6281 Muscle weakness (generalized): Secondary | ICD-10-CM | POA: Diagnosis not present

## 2019-12-15 DIAGNOSIS — R41841 Cognitive communication deficit: Secondary | ICD-10-CM | POA: Diagnosis not present

## 2019-12-16 DIAGNOSIS — J45909 Unspecified asthma, uncomplicated: Secondary | ICD-10-CM | POA: Diagnosis not present

## 2019-12-16 DIAGNOSIS — E119 Type 2 diabetes mellitus without complications: Secondary | ICD-10-CM | POA: Diagnosis not present

## 2019-12-16 DIAGNOSIS — M25531 Pain in right wrist: Secondary | ICD-10-CM | POA: Diagnosis not present

## 2019-12-16 DIAGNOSIS — M6281 Muscle weakness (generalized): Secondary | ICD-10-CM | POA: Diagnosis not present

## 2019-12-16 DIAGNOSIS — I10 Essential (primary) hypertension: Secondary | ICD-10-CM | POA: Diagnosis not present

## 2019-12-16 DIAGNOSIS — R41841 Cognitive communication deficit: Secondary | ICD-10-CM | POA: Diagnosis not present

## 2019-12-16 DIAGNOSIS — G629 Polyneuropathy, unspecified: Secondary | ICD-10-CM | POA: Diagnosis not present

## 2019-12-16 DIAGNOSIS — R2681 Unsteadiness on feet: Secondary | ICD-10-CM | POA: Diagnosis not present

## 2019-12-16 DIAGNOSIS — G4733 Obstructive sleep apnea (adult) (pediatric): Secondary | ICD-10-CM | POA: Diagnosis not present

## 2019-12-16 DIAGNOSIS — I509 Heart failure, unspecified: Secondary | ICD-10-CM | POA: Diagnosis not present

## 2019-12-16 DIAGNOSIS — F320 Major depressive disorder, single episode, mild: Secondary | ICD-10-CM | POA: Diagnosis not present

## 2019-12-17 DIAGNOSIS — M6281 Muscle weakness (generalized): Secondary | ICD-10-CM | POA: Diagnosis not present

## 2019-12-17 DIAGNOSIS — J45909 Unspecified asthma, uncomplicated: Secondary | ICD-10-CM | POA: Diagnosis not present

## 2019-12-17 DIAGNOSIS — R2681 Unsteadiness on feet: Secondary | ICD-10-CM | POA: Diagnosis not present

## 2019-12-17 DIAGNOSIS — G4733 Obstructive sleep apnea (adult) (pediatric): Secondary | ICD-10-CM | POA: Diagnosis not present

## 2019-12-17 DIAGNOSIS — R41841 Cognitive communication deficit: Secondary | ICD-10-CM | POA: Diagnosis not present

## 2019-12-18 DIAGNOSIS — Z20828 Contact with and (suspected) exposure to other viral communicable diseases: Secondary | ICD-10-CM | POA: Diagnosis not present

## 2019-12-18 DIAGNOSIS — R41841 Cognitive communication deficit: Secondary | ICD-10-CM | POA: Diagnosis not present

## 2019-12-18 DIAGNOSIS — Z9981 Dependence on supplemental oxygen: Secondary | ICD-10-CM | POA: Diagnosis not present

## 2019-12-18 DIAGNOSIS — R2681 Unsteadiness on feet: Secondary | ICD-10-CM | POA: Diagnosis not present

## 2019-12-18 DIAGNOSIS — F320 Major depressive disorder, single episode, mild: Secondary | ICD-10-CM | POA: Diagnosis not present

## 2019-12-18 DIAGNOSIS — M6281 Muscle weakness (generalized): Secondary | ICD-10-CM | POA: Diagnosis not present

## 2019-12-18 DIAGNOSIS — G629 Polyneuropathy, unspecified: Secondary | ICD-10-CM | POA: Diagnosis not present

## 2019-12-18 DIAGNOSIS — J45909 Unspecified asthma, uncomplicated: Secondary | ICD-10-CM | POA: Diagnosis not present

## 2019-12-18 DIAGNOSIS — I509 Heart failure, unspecified: Secondary | ICD-10-CM | POA: Diagnosis not present

## 2019-12-18 DIAGNOSIS — I10 Essential (primary) hypertension: Secondary | ICD-10-CM | POA: Diagnosis not present

## 2019-12-18 DIAGNOSIS — G4733 Obstructive sleep apnea (adult) (pediatric): Secondary | ICD-10-CM | POA: Diagnosis not present

## 2019-12-18 DIAGNOSIS — E119 Type 2 diabetes mellitus without complications: Secondary | ICD-10-CM | POA: Diagnosis not present

## 2019-12-19 DIAGNOSIS — M6281 Muscle weakness (generalized): Secondary | ICD-10-CM | POA: Diagnosis not present

## 2019-12-19 DIAGNOSIS — J45909 Unspecified asthma, uncomplicated: Secondary | ICD-10-CM | POA: Diagnosis not present

## 2019-12-19 DIAGNOSIS — R2681 Unsteadiness on feet: Secondary | ICD-10-CM | POA: Diagnosis not present

## 2019-12-19 DIAGNOSIS — G4733 Obstructive sleep apnea (adult) (pediatric): Secondary | ICD-10-CM | POA: Diagnosis not present

## 2019-12-19 DIAGNOSIS — R41841 Cognitive communication deficit: Secondary | ICD-10-CM | POA: Diagnosis not present

## 2019-12-20 DIAGNOSIS — M6281 Muscle weakness (generalized): Secondary | ICD-10-CM | POA: Diagnosis not present

## 2019-12-20 DIAGNOSIS — G4733 Obstructive sleep apnea (adult) (pediatric): Secondary | ICD-10-CM | POA: Diagnosis not present

## 2019-12-20 DIAGNOSIS — J45909 Unspecified asthma, uncomplicated: Secondary | ICD-10-CM | POA: Diagnosis not present

## 2019-12-20 DIAGNOSIS — R2681 Unsteadiness on feet: Secondary | ICD-10-CM | POA: Diagnosis not present

## 2019-12-20 DIAGNOSIS — R41841 Cognitive communication deficit: Secondary | ICD-10-CM | POA: Diagnosis not present

## 2019-12-21 DIAGNOSIS — R1111 Vomiting without nausea: Secondary | ICD-10-CM | POA: Diagnosis not present

## 2019-12-21 DIAGNOSIS — R2681 Unsteadiness on feet: Secondary | ICD-10-CM | POA: Diagnosis not present

## 2019-12-21 DIAGNOSIS — I509 Heart failure, unspecified: Secondary | ICD-10-CM | POA: Diagnosis not present

## 2019-12-21 DIAGNOSIS — M6281 Muscle weakness (generalized): Secondary | ICD-10-CM | POA: Diagnosis not present

## 2019-12-21 DIAGNOSIS — G629 Polyneuropathy, unspecified: Secondary | ICD-10-CM | POA: Diagnosis not present

## 2019-12-21 DIAGNOSIS — E119 Type 2 diabetes mellitus without complications: Secondary | ICD-10-CM | POA: Diagnosis not present

## 2019-12-21 DIAGNOSIS — G4733 Obstructive sleep apnea (adult) (pediatric): Secondary | ICD-10-CM | POA: Diagnosis not present

## 2019-12-21 DIAGNOSIS — R112 Nausea with vomiting, unspecified: Secondary | ICD-10-CM | POA: Diagnosis not present

## 2019-12-21 DIAGNOSIS — D649 Anemia, unspecified: Secondary | ICD-10-CM | POA: Diagnosis not present

## 2019-12-21 DIAGNOSIS — R41841 Cognitive communication deficit: Secondary | ICD-10-CM | POA: Diagnosis not present

## 2019-12-21 DIAGNOSIS — S63501A Unspecified sprain of right wrist, initial encounter: Secondary | ICD-10-CM | POA: Diagnosis not present

## 2019-12-21 DIAGNOSIS — I10 Essential (primary) hypertension: Secondary | ICD-10-CM | POA: Diagnosis not present

## 2019-12-21 DIAGNOSIS — F320 Major depressive disorder, single episode, mild: Secondary | ICD-10-CM | POA: Diagnosis not present

## 2019-12-21 DIAGNOSIS — J45909 Unspecified asthma, uncomplicated: Secondary | ICD-10-CM | POA: Diagnosis not present

## 2019-12-22 ENCOUNTER — Emergency Department (HOSPITAL_COMMUNITY): Payer: Medicare Other

## 2019-12-22 ENCOUNTER — Other Ambulatory Visit: Payer: Self-pay

## 2019-12-22 ENCOUNTER — Inpatient Hospital Stay (HOSPITAL_COMMUNITY)
Admission: EM | Admit: 2019-12-22 | Discharge: 2019-12-24 | DRG: 194 | Disposition: A | Discharge status: Skilled Nursing Facility | Payer: Medicare Other | Source: Skilled Nursing Facility | Attending: Family Medicine | Admitting: Family Medicine

## 2019-12-22 DIAGNOSIS — R1084 Generalized abdominal pain: Secondary | ICD-10-CM | POA: Diagnosis not present

## 2019-12-22 DIAGNOSIS — E876 Hypokalemia: Secondary | ICD-10-CM | POA: Diagnosis not present

## 2019-12-22 DIAGNOSIS — Z23 Encounter for immunization: Secondary | ICD-10-CM | POA: Diagnosis not present

## 2019-12-22 DIAGNOSIS — E1122 Type 2 diabetes mellitus with diabetic chronic kidney disease: Secondary | ICD-10-CM | POA: Diagnosis present

## 2019-12-22 DIAGNOSIS — M4319 Spondylolisthesis, multiple sites in spine: Secondary | ICD-10-CM | POA: Diagnosis not present

## 2019-12-22 DIAGNOSIS — E869 Volume depletion, unspecified: Secondary | ICD-10-CM | POA: Diagnosis present

## 2019-12-22 DIAGNOSIS — D631 Anemia in chronic kidney disease: Secondary | ICD-10-CM | POA: Diagnosis present

## 2019-12-22 DIAGNOSIS — Z6841 Body Mass Index (BMI) 40.0 and over, adult: Secondary | ICD-10-CM

## 2019-12-22 DIAGNOSIS — R52 Pain, unspecified: Secondary | ICD-10-CM | POA: Diagnosis not present

## 2019-12-22 DIAGNOSIS — R5381 Other malaise: Secondary | ICD-10-CM | POA: Diagnosis not present

## 2019-12-22 DIAGNOSIS — N179 Acute kidney failure, unspecified: Secondary | ICD-10-CM | POA: Diagnosis present

## 2019-12-22 DIAGNOSIS — Z7984 Long term (current) use of oral hypoglycemic drugs: Secondary | ICD-10-CM

## 2019-12-22 DIAGNOSIS — I5189 Other ill-defined heart diseases: Secondary | ICD-10-CM | POA: Diagnosis present

## 2019-12-22 DIAGNOSIS — E119 Type 2 diabetes mellitus without complications: Secondary | ICD-10-CM | POA: Diagnosis not present

## 2019-12-22 DIAGNOSIS — R410 Disorientation, unspecified: Secondary | ICD-10-CM | POA: Diagnosis not present

## 2019-12-22 DIAGNOSIS — R41841 Cognitive communication deficit: Secondary | ICD-10-CM | POA: Diagnosis not present

## 2019-12-22 DIAGNOSIS — D72829 Elevated white blood cell count, unspecified: Secondary | ICD-10-CM | POA: Diagnosis not present

## 2019-12-22 DIAGNOSIS — J9610 Chronic respiratory failure, unspecified whether with hypoxia or hypercapnia: Secondary | ICD-10-CM | POA: Diagnosis not present

## 2019-12-22 DIAGNOSIS — M4854XA Collapsed vertebra, not elsewhere classified, thoracic region, initial encounter for fracture: Secondary | ICD-10-CM | POA: Diagnosis present

## 2019-12-22 DIAGNOSIS — R29898 Other symptoms and signs involving the musculoskeletal system: Secondary | ICD-10-CM | POA: Diagnosis not present

## 2019-12-22 DIAGNOSIS — I129 Hypertensive chronic kidney disease with stage 1 through stage 4 chronic kidney disease, or unspecified chronic kidney disease: Secondary | ICD-10-CM | POA: Diagnosis not present

## 2019-12-22 DIAGNOSIS — Z20822 Contact with and (suspected) exposure to covid-19: Secondary | ICD-10-CM | POA: Diagnosis present

## 2019-12-22 DIAGNOSIS — R2681 Unsteadiness on feet: Secondary | ICD-10-CM | POA: Diagnosis not present

## 2019-12-22 DIAGNOSIS — K5900 Constipation, unspecified: Secondary | ICD-10-CM | POA: Diagnosis present

## 2019-12-22 DIAGNOSIS — M5489 Other dorsalgia: Secondary | ICD-10-CM | POA: Diagnosis not present

## 2019-12-22 DIAGNOSIS — S22080D Wedge compression fracture of T11-T12 vertebra, subsequent encounter for fracture with routine healing: Secondary | ICD-10-CM | POA: Diagnosis not present

## 2019-12-22 DIAGNOSIS — I10 Essential (primary) hypertension: Secondary | ICD-10-CM | POA: Diagnosis not present

## 2019-12-22 DIAGNOSIS — Z7982 Long term (current) use of aspirin: Secondary | ICD-10-CM

## 2019-12-22 DIAGNOSIS — F320 Major depressive disorder, single episode, mild: Secondary | ICD-10-CM | POA: Diagnosis not present

## 2019-12-22 DIAGNOSIS — E1169 Type 2 diabetes mellitus with other specified complication: Secondary | ICD-10-CM | POA: Diagnosis not present

## 2019-12-22 DIAGNOSIS — J45909 Unspecified asthma, uncomplicated: Secondary | ICD-10-CM | POA: Diagnosis not present

## 2019-12-22 DIAGNOSIS — M6281 Muscle weakness (generalized): Secondary | ICD-10-CM | POA: Diagnosis not present

## 2019-12-22 DIAGNOSIS — R112 Nausea with vomiting, unspecified: Secondary | ICD-10-CM | POA: Diagnosis present

## 2019-12-22 DIAGNOSIS — I509 Heart failure, unspecified: Secondary | ICD-10-CM | POA: Diagnosis not present

## 2019-12-22 DIAGNOSIS — K920 Hematemesis: Secondary | ICD-10-CM | POA: Diagnosis not present

## 2019-12-22 DIAGNOSIS — N183 Chronic kidney disease, stage 3 unspecified: Secondary | ICD-10-CM | POA: Diagnosis not present

## 2019-12-22 DIAGNOSIS — J9611 Chronic respiratory failure with hypoxia: Secondary | ICD-10-CM | POA: Diagnosis present

## 2019-12-22 DIAGNOSIS — R2689 Other abnormalities of gait and mobility: Secondary | ICD-10-CM | POA: Diagnosis not present

## 2019-12-22 DIAGNOSIS — R195 Other fecal abnormalities: Secondary | ICD-10-CM | POA: Diagnosis present

## 2019-12-22 DIAGNOSIS — R197 Diarrhea, unspecified: Secondary | ICD-10-CM | POA: Diagnosis present

## 2019-12-22 DIAGNOSIS — K439 Ventral hernia without obstruction or gangrene: Secondary | ICD-10-CM | POA: Diagnosis not present

## 2019-12-22 DIAGNOSIS — Z9981 Dependence on supplemental oxygen: Secondary | ICD-10-CM

## 2019-12-22 DIAGNOSIS — G4733 Obstructive sleep apnea (adult) (pediatric): Secondary | ICD-10-CM | POA: Diagnosis present

## 2019-12-22 DIAGNOSIS — I878 Other specified disorders of veins: Secondary | ICD-10-CM | POA: Diagnosis present

## 2019-12-22 DIAGNOSIS — E785 Hyperlipidemia, unspecified: Secondary | ICD-10-CM | POA: Diagnosis not present

## 2019-12-22 DIAGNOSIS — G629 Polyneuropathy, unspecified: Secondary | ICD-10-CM | POA: Diagnosis not present

## 2019-12-22 DIAGNOSIS — J189 Pneumonia, unspecified organism: Secondary | ICD-10-CM | POA: Diagnosis present

## 2019-12-22 DIAGNOSIS — K56609 Unspecified intestinal obstruction, unspecified as to partial versus complete obstruction: Secondary | ICD-10-CM | POA: Diagnosis not present

## 2019-12-22 DIAGNOSIS — I700 Atherosclerosis of aorta: Secondary | ICD-10-CM | POA: Diagnosis not present

## 2019-12-22 DIAGNOSIS — Z79899 Other long term (current) drug therapy: Secondary | ICD-10-CM

## 2019-12-22 DIAGNOSIS — M2550 Pain in unspecified joint: Secondary | ICD-10-CM | POA: Diagnosis not present

## 2019-12-22 DIAGNOSIS — Z8673 Personal history of transient ischemic attack (TIA), and cerebral infarction without residual deficits: Secondary | ICD-10-CM

## 2019-12-22 DIAGNOSIS — R0902 Hypoxemia: Secondary | ICD-10-CM | POA: Diagnosis not present

## 2019-12-22 DIAGNOSIS — Z7401 Bed confinement status: Secondary | ICD-10-CM | POA: Diagnosis not present

## 2019-12-22 LAB — CBC WITH DIFFERENTIAL/PLATELET
Abs Immature Granulocytes: 0.12 10*3/uL — ABNORMAL HIGH (ref 0.00–0.07)
Basophils Absolute: 0.1 10*3/uL (ref 0.0–0.1)
Basophils Relative: 1 %
Eosinophils Absolute: 0 10*3/uL (ref 0.0–0.5)
Eosinophils Relative: 0 %
HCT: 26.8 % — ABNORMAL LOW (ref 39.0–52.0)
Hemoglobin: 8.4 g/dL — ABNORMAL LOW (ref 13.0–17.0)
Immature Granulocytes: 1 %
Lymphocytes Relative: 9 %
Lymphs Abs: 2 10*3/uL (ref 0.7–4.0)
MCH: 26.4 pg (ref 26.0–34.0)
MCHC: 31.3 g/dL (ref 30.0–36.0)
MCV: 84.3 fL (ref 80.0–100.0)
Monocytes Absolute: 1.6 10*3/uL — ABNORMAL HIGH (ref 0.1–1.0)
Monocytes Relative: 7 %
Neutro Abs: 18.3 10*3/uL — ABNORMAL HIGH (ref 1.7–7.7)
Neutrophils Relative %: 82 %
Platelets: 665 10*3/uL — ABNORMAL HIGH (ref 150–400)
RBC: 3.18 MIL/uL — ABNORMAL LOW (ref 4.22–5.81)
RDW: 15.5 % (ref 11.5–15.5)
WBC: 22.2 10*3/uL — ABNORMAL HIGH (ref 4.0–10.5)
nRBC: 0 % (ref 0.0–0.2)

## 2019-12-22 LAB — COMPREHENSIVE METABOLIC PANEL
ALT: 14 U/L (ref 0–44)
AST: 18 U/L (ref 15–41)
Albumin: 3.1 g/dL — ABNORMAL LOW (ref 3.5–5.0)
Alkaline Phosphatase: 62 U/L (ref 38–126)
Anion gap: 12 (ref 5–15)
BUN: 49 mg/dL — ABNORMAL HIGH (ref 8–23)
CO2: 31 mmol/L (ref 22–32)
Calcium: 9.5 mg/dL (ref 8.9–10.3)
Chloride: 95 mmol/L — ABNORMAL LOW (ref 98–111)
Creatinine, Ser: 1.55 mg/dL — ABNORMAL HIGH (ref 0.61–1.24)
GFR calc Af Amer: 49 mL/min — ABNORMAL LOW (ref >60)
GFR calc non Af Amer: 43 mL/min — ABNORMAL LOW (ref >60)
Glucose, Bld: 127 mg/dL — ABNORMAL HIGH (ref 70–99)
Potassium: 3.6 mmol/L (ref 3.5–5.1)
Sodium: 138 mmol/L (ref 135–145)
Total Bilirubin: 0.5 mg/dL (ref 0.3–1.2)
Total Protein: 7.1 g/dL (ref 6.5–8.1)

## 2019-12-22 LAB — URINALYSIS, ROUTINE W REFLEX MICROSCOPIC
Bilirubin Urine: NEGATIVE
Glucose, UA: NEGATIVE mg/dL
Hgb urine dipstick: NEGATIVE
Ketones, ur: NEGATIVE mg/dL
Leukocytes,Ua: NEGATIVE
Nitrite: NEGATIVE
Protein, ur: NEGATIVE mg/dL
Specific Gravity, Urine: 1.011 (ref 1.005–1.030)
pH: 5 (ref 5.0–8.0)

## 2019-12-22 LAB — SARS CORONAVIRUS 2 BY RT PCR (HOSPITAL ORDER, PERFORMED IN ~~LOC~~ HOSPITAL LAB): SARS Coronavirus 2: NEGATIVE

## 2019-12-22 LAB — POC OCCULT BLOOD, ED: Fecal Occult Bld: NEGATIVE

## 2019-12-22 LAB — STREP PNEUMONIAE URINARY ANTIGEN: Strep Pneumo Urinary Antigen: NEGATIVE

## 2019-12-22 LAB — LIPASE, BLOOD: Lipase: 35 U/L (ref 11–51)

## 2019-12-22 MED ORDER — MIDODRINE HCL 5 MG PO TABS
5.0000 mg | ORAL_TABLET | Freq: Three times a day (TID) | ORAL | Status: DC | PRN
  Filled 2019-12-22 (×3): qty 1

## 2019-12-22 MED ORDER — INSULIN ASPART 100 UNIT/ML ~~LOC~~ SOLN: 0.0000 [IU] | Freq: Three times a day (TID) | SUBCUTANEOUS | Status: DC

## 2019-12-22 MED ORDER — AMLODIPINE BESYLATE 10 MG PO TABS
10.0000 mg | ORAL_TABLET | Freq: Every day | ORAL | Status: DC
  Administered 2019-12-22 – 2019-12-24 (×3): 10 mg via ORAL
  Filled 2019-12-22: qty 2
  Filled 2019-12-22 (×2): qty 1

## 2019-12-22 MED ORDER — SODIUM CHLORIDE 0.9 % IV SOLN: INTRAVENOUS | Status: AC

## 2019-12-22 MED ORDER — INSULIN ASPART 100 UNIT/ML ~~LOC~~ SOLN
3.0000 [IU] | Freq: Three times a day (TID) | SUBCUTANEOUS | Status: DC
  Administered 2019-12-23 – 2019-12-24 (×6): 3 [IU] via SUBCUTANEOUS

## 2019-12-22 MED ORDER — VITAMIN D 25 MCG (1000 UNIT) PO TABS
5000.0000 [IU] | ORAL_TABLET | Freq: Every day | ORAL | Status: DC
  Administered 2019-12-22 – 2019-12-24 (×3): 5000 [IU] via ORAL
  Filled 2019-12-22 (×3): qty 5

## 2019-12-22 MED ORDER — PREGABALIN 50 MG PO CAPS
50.0000 mg | ORAL_CAPSULE | Freq: Three times a day (TID) | ORAL | Status: DC
  Administered 2019-12-22 – 2019-12-24 (×6): 50 mg via ORAL
  Filled 2019-12-22 (×2): qty 1
  Filled 2019-12-22: qty 2
  Filled 2019-12-22 (×3): qty 1

## 2019-12-22 MED ORDER — SODIUM CHLORIDE 0.9 % IV SOLN
2.0000 g | INTRAVENOUS | Status: DC
  Administered 2019-12-23 (×2): 2 g via INTRAVENOUS
  Filled 2019-12-22: qty 20
  Filled 2019-12-22: qty 2
  Filled 2019-12-22: qty 20

## 2019-12-22 MED ORDER — ASPIRIN EC 81 MG PO TBEC
81.0000 mg | DELAYED_RELEASE_TABLET | Freq: Every day | ORAL | Status: DC
  Administered 2019-12-22 – 2019-12-24 (×3): 81 mg via ORAL
  Filled 2019-12-22 (×3): qty 1

## 2019-12-22 MED ORDER — DERMACLOUD EX CREA: TOPICAL_CREAM | Freq: Every day | CUTANEOUS | Status: DC

## 2019-12-22 MED ORDER — FLUTICASONE PROPIONATE 50 MCG/ACT NA SUSP
1.0000 | Freq: Every day | NASAL | Status: DC
  Administered 2019-12-23 – 2019-12-24 (×2): 1 via NASAL
  Filled 2019-12-22: qty 16

## 2019-12-22 MED ORDER — GUAIFENESIN ER 600 MG PO TB12: 600.0000 mg | ORAL_TABLET | Freq: Two times a day (BID) | ORAL | Status: DC | PRN

## 2019-12-22 MED ORDER — ATORVASTATIN CALCIUM 10 MG PO TABS
20.0000 mg | ORAL_TABLET | Freq: Every day | ORAL | Status: DC
  Administered 2019-12-22 – 2019-12-23 (×2): 20 mg via ORAL
  Filled 2019-12-22 (×2): qty 2

## 2019-12-22 MED ORDER — TRAMADOL HCL 50 MG PO TABS
50.0000 mg | ORAL_TABLET | Freq: Three times a day (TID) | ORAL | Status: DC | PRN
  Administered 2019-12-23: 50 mg via ORAL
  Filled 2019-12-22: qty 1

## 2019-12-22 MED ORDER — NYSTATIN 100000 UNIT/GM EX POWD
1.0000 "application " | Freq: Two times a day (BID) | CUTANEOUS | Status: DC
  Administered 2019-12-23 – 2019-12-24 (×3): 1 via TOPICAL
  Filled 2019-12-22: qty 15

## 2019-12-22 MED ORDER — ONDANSETRON HCL 4 MG/2ML IJ SOLN: 4.0000 mg | Freq: Four times a day (QID) | INTRAMUSCULAR | Status: DC | PRN

## 2019-12-22 MED ORDER — IOHEXOL 300 MG/ML  SOLN
75.0000 mL | Freq: Once | INTRAMUSCULAR | Status: AC | PRN
  Administered 2019-12-22: 75 mL via INTRAVENOUS

## 2019-12-22 MED ORDER — ALBUTEROL SULFATE (2.5 MG/3ML) 0.083% IN NEBU: 2.5000 mg | INHALATION_SOLUTION | Freq: Four times a day (QID) | RESPIRATORY_TRACT | Status: DC | PRN

## 2019-12-22 MED ORDER — SODIUM CHLORIDE 0.9 % IV SOLN
500.0000 mg | INTRAVENOUS | Status: DC
  Administered 2019-12-22 – 2019-12-24 (×2): 500 mg via INTRAVENOUS
  Filled 2019-12-22 (×2): qty 500

## 2019-12-22 MED ORDER — SODIUM CHLORIDE 0.9 % IV SOLN
500.0000 mg | Freq: Once | INTRAVENOUS | Status: DC
  Filled 2019-12-22: qty 500

## 2019-12-22 MED ORDER — SODIUM CHLORIDE 0.9 % IV SOLN
1.0000 g | Freq: Once | INTRAVENOUS | Status: DC
  Administered 2019-12-22: 1 g via INTRAVENOUS
  Filled 2019-12-22: qty 10

## 2019-12-22 MED ORDER — DULOXETINE HCL 30 MG PO CPEP
30.0000 mg | ORAL_CAPSULE | Freq: Every day | ORAL | Status: DC
  Administered 2019-12-22 – 2019-12-24 (×3): 30 mg via ORAL
  Filled 2019-12-22 (×3): qty 1

## 2019-12-22 NOTE — ED Provider Notes (Signed)
[place] EMERGENCY DEPARTMENT Provider Note   CSN: [id number] Arrival date & time: 12/22/19  1217     History Chief Complaint  Patient presents with  . Constipation    constipated until bowel movement yesterday    Brian Silva is a 79 y.o. male as in history of chronic respiratory failure (on 2 L of oxygen at baseline), hyperlipidemia, hypertension brought in by EMS for evaluation of abdominal pain.  Per EMS, patient has been having a few days of abdominal pain.  They did an x-ray yesterday that was concerning for possible bowel obstruction.  After the x-ray, he had a bowel movement but was still having some abdominal pain, prompting ED visit.  Patient states that his abdomen has been hurting for about 3 days.  He states it is.  He states he has had some vomiting yesterday but states that he has not any vomiting today.  He states he has been tolerating some diet Coke today.  He states he has had diarrhea.  He reports yesterday, the diarrhea was dark and may have had some blood in it.  He states that his abdominal pain has improved but he is still having some pain particularly on the left side.  He thinks he may have had a fever but is unsure. He says he has felt hot but he does not know if they have measured a fever. He is not any dysuria, hematuria, chest pain, difficulty breathing.  The history is provided by the patient.       Past Medical History:  Diagnosis Date  . Asthma   . Chronic respiratory failure (HCC)    on 2L oxygen  . Hyperlipidemia   . Hypertension   . Obese   . OSA (obstructive sleep apnea)    Sleep study 08/2010 showed mild to moderate obstructive sleep apnea/hypopnea syndrome. However, never initiated CPAP  . Venous stasis    Venous Statis Disease (03/28/2004)    Patient Active Problem List   Diagnosis Date Noted  . CAP (community acquired pneumonia) 12/22/2019  . Compression fracture of body of thoracic vertebra ([place]) 09/10/2019  .  Fall at home, initial encounter 09/10/2019  . AKI (acute kidney injury) ([place]) 01/17/2018  . Syncope, vasovagal 01/17/2018  . Diabetes mellitus type II, non insulin dependent ([place]) 01/17/2018  . Hyponatremia 01/17/2018  . Syncope 01/17/2018  . Depression 10/25/2015  . Hyperlipidemia 10/25/2015  . TINEA PEDIS 01/04/2010  . TINEA VERSICOLOR 01/04/2010  . URI 01/04/2010  . HYPERGLYCEMIA 12/02/2008  . ABDOMINAL WALL HERNIA 12/01/2007  . OBESITY, MORBID 04/04/2007  . SLEEP APNEA, OBSTRUCTIVE 04/04/2007  . LOW BACK PAIN, CHRONIC 12/07/2004  . HYPERLIPIDEMIA 08/12/2002  . ALLERGIC RHINITIS, SEASONAL 08/12/2002  . Essential hypertension 04/01/2000  . Asthma 10/11/1997  . SUPERFICIAL PHLEBITIS 05/06/1990    No past surgical history on file.     No family history on file.  Social History   Tobacco Use  . Smoking status: Never Smoker  . Smokeless tobacco: Never Used  Vaping Use  . Vaping Use: Never used  Substance Use Topics  . Alcohol use: Yes    Comment: occasionally   . Drug use: Never    Home Medications Prior to Admission medications   Medication Sig Start Date End Date Taking? Authorizing Provider  albuterol (PROVENTIL HFA;VENTOLIN HFA) 108 (90 Base) MCG/ACT inhaler Inhale 2 puffs into the lungs every 6 (six) hours as needed. For shortness of breath 10/28/15  Yes Theodis Blaze, MD  amLODipine (NORVASC) 10 MG tablet Take 10 mg by mouth daily.  09/03/17  Yes [provider]  aspirin EC 81 MG tablet Take 81 mg by mouth daily.   Yes [provider]  atorvastatin (LIPITOR) 20 MG tablet Take 20 mg by mouth at bedtime.     Yes [provider]  Cholecalciferol (VITAMIN D3) 125 MCG (5000 UT) TBDP Take 5,000 Units by mouth daily.   Yes [provider]  docusate sodium (COLACE) 100 MG capsule Take 100 mg by mouth 2 (two) times daily.   Yes [provider]  DULoxetine (CYMBALTA) 30 MG capsule Take 30 mg by mouth daily.   Yes [provider]  fluticasone (FLONASE) 50 MCG/ACT nasal spray Place 1 spray into both nostrils daily. 05/07/19  Yes [provider]  furosemide (LASIX) 80 MG tablet Take 80 mg by mouth 2 (two) times daily.    Yes [provider]  [employer] [facility]) Apply 1 application topically in the morning and at bedtime. buttocks   Yes [provider]  lisinopril (ZESTRIL) 5 MG tablet Take 1 tablet (5 mg total) by mouth daily. 09/14/19  Yes Nita Sells, MD  metFORMIN (GLUCOPHAGE) 500 MG tablet Take 500 mg by mouth 2 (two) times daily with a meal.  07/14/17  Yes [provider]  midodrine (PROAMATINE) 5 MG tablet Take 1 tablet (5 mg total) by mouth 3 (three) times daily with meals. 09/14/19  Yes Nita Sells, MD  nystatin (NYSTATIN) powder Apply 1 application topically in the morning and at bedtime. Abdominal folds   Yes [provider]  ondansetron (ZOFRAN) 4 MG tablet Take 4 mg by mouth every 8 (eight) hours as needed for nausea or vomiting.   Yes [provider]  polyethylene glycol (MIRALAX / GLYCOLAX) 17 g packet Take 17 g by mouth daily as needed for mild constipation.   Yes [provider]  pregabalin (LYRICA) 50 MG capsule Take 50 mg by mouth 3 (three) times daily.   Yes [provider]  traMADol (ULTRAM) 50 MG tablet Take 1 tablet (50 mg total) by mouth every 8 (eight) hours as needed for moderate pain. 09/14/19  Yes Nita Sells, MD  acetaminophen (TYLENOL) 500 MG tablet Take 2 tablets (1,000 mg total) by mouth every 8 (eight) hours. Patient not taking: Reported on 12/22/2019 09/14/19   Nita Sells, MD    Allergies    Patient has no known allergies.  Review of Systems   Review of Systems  Constitutional: Negative for fever.  Respiratory: Negative for cough and shortness of breath.   Cardiovascular: Negative for chest pain.  Gastrointestinal: Positive for abdominal pain, blood in  stool, diarrhea, nausea and vomiting.  Genitourinary: Negative for dysuria and hematuria.  Neurological: Negative for headaches.  All other systems reviewed and are negative.   Physical Exam Updated Vital Signs BP (!) 151/67   Pulse 67   Temp 98.6 F (37 C) (Oral)   Resp 19   SpO2 98%   Physical Exam Vitals and nursing note reviewed.  Constitutional:      Appearance: Normal appearance. He is well-developed.  HENT:     Head: Normocephalic and atraumatic.  Eyes:     General: Lids are normal.     Conjunctiva/sclera: Conjunctivae normal.     Pupils: Pupils are equal, round, and reactive to light.  Cardiovascular:     Rate and Rhythm: Normal rate and regular rhythm.     Pulses: Normal pulses.  Heart sounds: Normal heart sounds. No murmur heard.  No friction rub. No gallop.   Pulmonary:     Effort: Pulmonary effort is normal.     Breath sounds: Normal breath sounds.     Comments: Lungs clear to auscultation bilaterally.  Symmetric chest rise.  No wheezing, rales, rhonchi. Abdominal:     Palpations: Abdomen is soft. Abdomen is not rigid.     Tenderness: There is abdominal tenderness in the left upper quadrant and left lower quadrant. There is no right CVA tenderness, left CVA tenderness or guarding.     Comments: Abdomen soft, nondistended.  Tenderness noted to left upper and left lower quadrant.  No rigidity, guarding.  No CVA tenderness noted bilaterally.  Musculoskeletal:        General: Normal range of motion.     Cervical back: Full passive range of motion without pain.  Skin:    General: Skin is warm and dry.     Capillary Refill: Capillary refill takes less than 2 seconds.  Neurological:     Mental Status: He is alert and oriented to person, place, and time.  Psychiatric:        Speech: Speech normal.     ED Results / Procedures / Treatments   Labs (all labs ordered are listed, but only abnormal results are displayed) Labs Reviewed  COMPREHENSIVE METABOLIC  PANEL - Abnormal; Notable for the following components:      Result Value   Chloride 95 (*)    Glucose, Bld 127 (*)    BUN 49 (*)    Creatinine, Ser 1.55 (*)    Albumin 3.1 (*)    GFR calc non Af Amer 43 (*)    GFR calc Af Amer 49 (*)    All other components within normal limits  CBC WITH DIFFERENTIAL/PLATELET - Abnormal; Notable for the following components:   WBC 22.2 (*)    RBC 3.18 (*)    Hemoglobin 8.4 (*)    HCT 26.8 (*)    Platelets 665 (*)    Neutro Abs 18.3 (*)    Monocytes Absolute 1.6 (*)    Abs Immature Granulocytes 0.12 (*)    All other components within normal limits  URINE CULTURE  CULTURE, BLOOD (ROUTINE X 2)  CULTURE, BLOOD (ROUTINE X 2)  SARS CORONAVIRUS 2 BY RT PCR (HOSPITAL ORDER, [place] [facility])  EXPECTORATED SPUTUM ASSESSMENT W REFEX TO RESP CULTURE  LIPASE, BLOOD  URINALYSIS, ROUTINE W REFLEX MICROSCOPIC  HIV ANTIBODY (ROUTINE TESTING W REFLEX)  TSH  INFLUENZA PANEL BY PCR (TYPE A & B)  LEGIONELLA PNEUMOPHILA SEROGP 1 UR AG  STREP PNEUMONIAE URINARY ANTIGEN  COMPREHENSIVE METABOLIC PANEL  CBC WITH DIFFERENTIAL/PLATELET  HEMOGLOBIN A1C  POC OCCULT BLOOD, ED    EKG None  Radiology CT ABDOMEN PELVIS W CONTRAST  Result Date: 12/22/2019 CLINICAL DATA:  Acute nonlocalized abdominal pain EXAM: CT ABDOMEN AND PELVIS WITH CONTRAST TECHNIQUE: Multidetector CT imaging of the abdomen and pelvis was performed using the standard protocol following bolus administration of intravenous contrast. CONTRAST:  28mL OMNIPAQUE IOHEXOL 300 MG/ML  SOLN COMPARISON:  CT 10/06/2017 FINDINGS: Lower chest: Patchy consolidative and tree-in-bud nodularity seen in the lung bases. More bandlike opacities in both lower lobes likely reflecting subsegmental atelectatic change and/or scarring. Some abundant subpleural fat is noted as well. Cardiac size. No pericardial effusion. Calcifications of the coronary arteries and aortic leaflets. Hepatobiliary: No worrisome  focal liver lesions. Smooth liver surface contour. Normal hepatic attenuation.  Prominent fold at the gallbladder body. No visible calcified gallstones within the gallbladder or biliary tree. No biliary ductal dilatation. Pancreas: Unremarkable. No pancreatic ductal dilatation or surrounding inflammatory changes. Spleen: Normal splenic size. No concerning splenic lesions. Scattered punctate calcifications throughout the spleen likely reflecting sequela of remote granulomatous disease. Adrenals/Urinary Tract: Normal adrenal glands. Stable mild bilateral symmetric perinephric stranding, a nonspecific finding though may correlate with either age or decreased renal function. Kidneys enhance uniformly and excrete symmetrically. No visible concerning renal lesion. No urolithiasis or frank hydronephrosis. There is some mild circumferential bladder wall thickening. Some of which may be related to underdistention though faint perivesicular hazy stranding is present and could suggest an underlying cystitis. Stomach/Bowel: Distal esophagus, stomach and duodenum are unremarkable. No small bowel thickening or dilatation. A normal appendix is visualized. No colonic dilatation or wall thickening. No evidence of bowel obstruction. Vascular/Lymphatic: Atherosclerotic calcifications within the abdominal aorta and branch vessels. No aneurysm or ectasia. No enlarged abdominopelvic lymph nodes. Reproductive: The prostate and seminal vesicles are unremarkable. Other: Stable appearance of a lobular fat containing ventral hernia without evidence of strangulation of the herniated fat. No bowel containing hernias. No abdominopelvic free air or fluid. Musculoskeletal: Multilevel degenerative changes are present in the imaged portions of the spine. Likely acute to subacute appearing anterior wedging compression deformity of the L1 vertebral body with approximately 40% height loss anteriorly and some mild surrounding thickening. Vacuum  phenomenon noted in the adjacent disc. 2 mm retrolisthesis of L4 on 5 and 3 mm of grade 1 anterolisthesis L5 on S1 with associated bilateral L5 pars defects, similar to comparison. No other acute or suspicious osseous abnormalities. IMPRESSION: 1. Patchy consolidative and tree-in-bud nodularity in the lung bases, concerning for acute infection or inflammation. 2. Likely acute to subacute appearing anterior wedging compression deformity of the L1 vertebral body with approximately 40% height loss anteriorly and some mild surrounding thickening. Correlate for point tenderness. 3. Mild circumferential bladder wall thickening with faint perivesicular hazy stranding, suggestive of an underlying cystitis, consider correlation with urinalysis. Stable mild nonspecific perinephric stranding could be related to diminished renal function or advanced age though ascending infection is not excluded. 4. Stable appearance of a lobular fat containing ventral hernia without evidence of strangulation of the herniated fat. 5. Aortic Atherosclerosis (ICD10-I70.0). Electronically Signed   By: Lovena Le M.D.   On: 12/22/2019 16:21   DG Chest Portable 1 View  Result Date: 12/22/2019 CLINICAL DATA:  Leukocytosis EXAM: PORTABLE CHEST 1 VIEW COMPARISON:  Chest radiograph dated 09/10/2019. FINDINGS: The heart size is normal. Vascular calcifications are seen in the aortic arch. Mild right basilar atelectasis/airspace disease is noted. The left lung is clear. There is no pleural effusion or pneumothorax. The visualized skeletal structures are unremarkable. IMPRESSION: Mild right basilar atelectasis/airspace disease. Electronically Signed   By: Zerita Boers M.D.   On: 12/22/2019 17:37    Procedures Procedures (including critical care time)  Medications Ordered in ED Medications  aspirin EC tablet 81 mg (has no administration in time range)  traMADol (ULTRAM) tablet 50 mg (has no administration in time range)  amLODipine (NORVASC)  tablet 10 mg (has no administration in time range)  atorvastatin (LIPITOR) tablet 20 mg (has no administration in time range)  midodrine (PROAMATINE) tablet 5 mg (has no administration in time range)  DULoxetine (CYMBALTA) DR capsule 30 mg (has no administration in time range)  Dermacloud CREA (has no administration in time range)  pregabalin (LYRICA) capsule 50 mg (has no administration  in time range)  Vitamin D3 TBDP 5,000 Units (has no administration in time range)  albuterol (VENTOLIN HFA) 108 (90 Base) MCG/ACT inhaler 2 puff (has no administration in time range)  fluticasone (FLONASE) 50 MCG/ACT nasal spray 1 spray (has no administration in time range)  nystatin (MYCOSTATIN/NYSTOP) topical powder 1 application (has no administration in time range)  0.9 %  sodium chloride infusion (has no administration in time range)  cefTRIAXone (ROCEPHIN) 2 g in sodium chloride 0.9 % 100 mL IVPB (has no administration in time range)  azithromycin (ZITHROMAX) 500 mg in sodium chloride 0.9 % 250 mL IVPB (has no administration in time range)  insulin aspart (novoLOG) injection 0-9 Units (has no administration in time range)  insulin aspart (novoLOG) injection 3 Units (has no administration in time range)  iohexol (OMNIPAQUE) 300 MG/ML solution 75 mL (75 mLs Intravenous Contrast Given 12/22/19 1553)    ED Course  I have reviewed the triage vital signs and the nursing notes.  Pertinent labs & imaging results that were available during my care of the patient were reviewed by me and considered in my medical decision making (see chart for details).    MDM Rules/Calculators/A&P                          79 year old male who presents for evaluation of abdominal pain x3 days.  Associate with some nausea/vomiting/diarrhea.  He lives in a nursing home and had an x-ray done that was concerning for possible bowel obstruction.  He did have a bowel movement afterwards and felt better but states pain is returned today.   On initially arrival, he is afebrile.  Appears uncomfortable in no acute distress.  Vitals otherwise stable.  He has some tenderness noted on left lower abdomen.  Concern for viral infectious process versus diverticulitis.  Low suspicion for small bowel obstruction given that he is having diarrhea but will plan for CT abdomen pelvis for evaluation.  Plan for labs, CT scan.  Lipase is normal.  Fecal occult is negative.  CMP shows BUN of 49, creatinine 1.55.  CBC shows leukocytosis of 22.2.  Hemoglobin is 8.4.  Platelets are 665.  CT on pelvis shows patchy consolidative and tree-in-bud nodularity in the lung bases concerning for acute infection or inflammation. He has subacute appearing anterior wedge compression deformity at L1. There is circumferential bladder wall thickening with faint perivesicular hazy stranding suggestive of underlying cystitis.  UA negative for any infection. Chest x-ray shows mild right basilar atelectasis/airspace disease.  Patient started on Rocephin and azithromycin for treatment of pneumonia.  He has a PSI score of 117.   Discussed patient with Dr. Marcello Moores who accepts patient for admission.   Portions of this note were generated with [employer]. Dictation errors may occur despite best attempts at proofreading.   Final Clinical Impression(s) / ED Diagnoses Final diagnoses:  Community acquired pneumonia of right lung, unspecified part of lung  AKI (acute kidney injury) [facility])    Rx / DC Orders ED Discharge Orders    None       Desma Mcgregor 12/22/19 2133    Lennice Sites, DO 12/23/19 [phone]

## 2019-12-22 NOTE — ED Triage Notes (Signed)
Pt BIB [facility] from [facility]. Per EMS facility did rule out for bowel obstruction yesterday. Xray was positive for bowel obstruction. Pt was also complaining of abdominal pain. Per EMS following xray yesterday pt had bowel movement and abdominal pain ceased. Pt A&Ox4. VSS. NAD.

## 2019-12-22 NOTE — ED Provider Notes (Signed)
Medical screening examination/treatment/procedure(s) were conducted as a shared visit with non-physician practitioner(s) and myself.  I personally evaluated the patient during the encounter. Briefly, the patient is a 79 y.o. male with history of hypertension, high cholesterol presents to the ED with abdominal pain, nausea.  Normal vitals.  No fever.  Have been abdominal pain is supposed to have an x-ray yesterday showed possible obstruction.  He states that he has had a bowel movement.  No active nausea or vomiting.  Denies history of abdominal surgery.  Not sure if he is had dark or bloody stools.  Overall he is well-appearing.  Will check basic labs and get a CT scan abdomen and pelvis.  Could be diverticulitis, bowel obstruction.  Please see my physician associates note for further results, evaluation, disposition patient.  This chart was dictated using voice recognition software.  Despite best efforts to proofread,  errors can occur which can change the documentation meaning.     EKG Interpretation None           Lennice Sites, DO 12/22/19 1512

## 2019-12-22 NOTE — H&P (Addendum)
History and Physical    Brian Silva IZT:[id number] DOB: 10-17-1940 DOA: 12/22/2019  PCP: Benito Mccreedy, MD  Patient coming from: [facility]  I have personally briefly reviewed patient's old medical records in [facility]  Chief Complaint: abdominal pain n/v/d /fatigue /mild sob x 3 days  HPI: Brian Silva is a 79 y.o. male with medical history significant of  HTN intermittently labile, Grade I diastolic dysfunction , chronic venostasis, HLD, asthma, morbid obesity, DMII, osa on cpap , chronic respiratory failure on 2L O2, history of syncopal episodes,falls, compression fracture thoracic spine,chronic venostasis on lasix who presents form NH sent int due to N/v/abdominal pain x3 days initially there was concern bowel obstruction due to abnormal imaging  At NH noting possible bowel obstruction. However patient was able to have bm at facility with improvement in abdominal pain.Patient now sent to ed due to continue symptoms of n/v/ and now diarrhea. Per patient he has has abdominal pain n/v/d/ fatigue/ mild sob x 3 days.He denies chest pain, and noted his abdominal pain has resolved, he noted chills but did not note fever. On evaluation in ed patient was found to have b/l lower love infiltrate but no acute abdominal findings. Patient is admitted for further evaluation and diagnosis of CAP  ED Course:  Afeb, bp 133/77, map 91  Hr 86, srr 20,  Labs: wbc 22.2, pmn 82, plt665, hgb8.4 was 9.5 Na:138, K 3.6, CL95,  Glucose127, cr 1.55 was 1.08, lipase 35 JA:[id number] CT abd:   Lower chest:Patchy consolidative and tree-in-bud nodularity seen in the lung bases.  BHA:LPFXTK consolidative and tree-in-bud nodularity seen in the lung bases. More bandlike opacities in both lower lobes likely reflecting subsegmental  Patchy consolidative and tree-in-bud nodularity seen in the lung bases. More bandlike opacities in both lower lobes likely reflecting subsegmental  Review of  Systems: As per HPI otherwise 10 point review of systems negative.   Past Medical History:  Diagnosis Date  . Asthma   . Chronic respiratory failure (HCC)    on 2L oxygen  . Hyperlipidemia   . Hypertension   . Obese   . OSA (obstructive sleep apnea)    Sleep study 08/2010 showed mild to moderate obstructive sleep apnea/hypopnea syndrome. However, never initiated CPAP  . Venous stasis    Venous Statis Disease (03/28/2004)      Hx reviewed no pertinent surgical history     reports that he has never smoked. He has never used smokeless tobacco. He reports current alcohol use. He reports that he does not use drugs.  No Known Allergies  No family history on file.  Prior to Admission medications   Medication Sig Start Date End Date Taking? Authorizing Provider  albuterol (PROVENTIL HFA;VENTOLIN HFA) 108 (90 Base) MCG/ACT inhaler Inhale 2 puffs into the lungs every 6 (six) hours as needed. For shortness of breath 10/28/15  Yes Theodis Blaze, MD  amLODipine (NORVASC) 10 MG tablet Take 10 mg by mouth daily.  09/03/17  Yes [provider]  aspirin EC 81 MG tablet Take 81 mg by mouth daily.   Yes [provider]  atorvastatin (LIPITOR) 20 MG tablet Take 20 mg by mouth at bedtime.     Yes [provider]  Cholecalciferol (VITAMIN D3) 125 MCG (5000 UT) TBDP Take 5,000 Units by mouth daily.   Yes [provider]  docusate sodium (COLACE) 100 MG capsule Take 100 mg by mouth 2 (two) times daily.   Yes [provider]  DULoxetine (CYMBALTA) 30 MG capsule Take 30 mg by mouth daily.   Yes [provider]  fluticasone (FLONASE) 50 MCG/ACT nasal spray Place 1 spray into both nostrils daily. 05/07/19  Yes [provider]  furosemide (LASIX) 80 MG tablet Take 80 mg by mouth 2 (two) times daily.    Yes [provider]  [employer] [facility] EX) Apply 1 application topically in the morning and at bedtime. buttocks   Yes  [provider]  lisinopril (ZESTRIL) 5 MG tablet Take 1 tablet (5 mg total) by mouth daily. 09/14/19  Yes Nita Sells, MD  metFORMIN (GLUCOPHAGE) 500 MG tablet Take 500 mg by mouth 2 (two) times daily with a meal.  07/14/17  Yes [provider]  midodrine (PROAMATINE) 5 MG tablet Take 1 tablet (5 mg total) by mouth 3 (three) times daily with meals. 09/14/19  Yes Nita Sells, MD  nystatin (NYSTATIN) powder Apply 1 application topically in the morning and at bedtime. Abdominal folds   Yes [provider]  ondansetron (ZOFRAN) 4 MG tablet Take 4 mg by mouth every 8 (eight) hours as needed for nausea or vomiting.   Yes [provider]  polyethylene glycol (MIRALAX / GLYCOLAX) 17 g packet Take 17 g by mouth daily as needed for mild constipation.   Yes [provider]  pregabalin (LYRICA) 50 MG capsule Take 50 mg by mouth 3 (three) times daily.   Yes [provider]  traMADol (ULTRAM) 50 MG tablet Take 1 tablet (50 mg total) by mouth every 8 (eight) hours as needed for moderate pain. 09/14/19  Yes Nita Sells, MD  acetaminophen (TYLENOL) 500 MG tablet Take 2 tablets (1,000 mg total) by mouth every 8 (eight) hours. Patient not taking: Reported on 12/22/2019 09/14/19   Nita Sells, MD    Physical Exam: Vitals:   12/22/19 1300 12/22/19 1945  BP: 133/77 (!) 151/67  Pulse: 86 67  Resp: 20 19  Temp: 98.6 F (37 C)   TempSrc: Oral   SpO2:  98%    Constitutional: NAD, calm, comfortable Vitals:   12/22/19 1300 12/22/19 1945  BP: 133/77 (!) 151/67  Pulse: 86 67  Resp: 20 19  Temp: 98.6 F (37 C)   TempSrc: Oral   SpO2:  98%   Eyes: PERRL, lids and conjunctivae normal ENMT: Mucous membranes are moist. Posterior pharynx clear of any exudate or lesions.Normal dentition.  Neck: normal, supple, no masses, no thyromegaly Respiratory: clear to auscultation bilaterally ant, no wheezing, no crackles. Normal respiratory  effort. No accessory muscle use.  Cardiovascular: Regular rate and rhythm, no murmurs / rubs / gallops. No +edema. 2+ pedal pulses. No carotid bruits.  Abdomen: obese no tenderness, no masses palpated. No hepatosplenomegaly. Bowel sounds positive.  Musculoskeletal: no clubbing / cyanosis. No joint deformity upper and lower extremities. Good ROM, no contractures. Normal muscle tone.  Skin: no rashes, lesions, ulcers. No induration Neurologic: CN 2-12 grossly intact. Sensation intact, DTR normal. Strength 5/5 in all 4.  Psychiatric: Normal judgment and insight. Alert and oriented x 3. Normal mood.    Labs on Admission: I have personally reviewed following labs and imaging studies  CBC: Recent Labs  Lab 12/22/19 1444  WBC 22.2*  NEUTROABS 18.3*  HGB 8.4*  HCT 26.8*  MCV 84.3  PLT 923*   Basic Metabolic Panel: Recent Labs  Lab 12/22/19 1444  NA 138  K 3.6  CL 95*  CO2 31  GLUCOSE 127*  BUN 49*  CREATININE  1.55*  CALCIUM 9.5   GFR: CrCl cannot be calculated (Unknown ideal weight.). Liver Function Tests: Recent Labs  Lab 12/22/19 1444  AST 18  ALT 14  ALKPHOS 62  BILITOT 0.5  PROT 7.1  ALBUMIN 3.1*   Recent Labs  Lab 12/22/19 1444  LIPASE 35   No results for input(s): AMMONIA in the last 168 hours. Coagulation Profile: No results for input(s): INR, PROTIME in the last 168 hours. Cardiac Enzymes: No results for input(s): CKTOTAL, CKMB, CKMBINDEX, TROPONINI in the last 168 hours. BNP (last 3 results) No results for input(s): PROBNP in the last 8760 hours. HbA1C: No results for input(s): HGBA1C in the last 72 hours. CBG: No results for input(s): GLUCAP in the last 168 hours. Lipid Profile: No results for input(s): CHOL, HDL, LDLCALC, TRIG, CHOLHDL, LDLDIRECT in the last 72 hours. Thyroid Function Tests: No results for input(s): TSH, T4TOTAL, FREET4, T3FREE, THYROIDAB in the last 72 hours. Anemia Panel: No results for input(s): VITAMINB12, FOLATE, FERRITIN,  TIBC, IRON, RETICCTPCT in the last 72 hours. Urine analysis:    Component Value Date/Time   COLORURINE YELLOW 12/22/2019 1855   APPEARANCEUR CLEAR 12/22/2019 1855   LABSPEC 1.011 12/22/2019 1855   PHURINE 5.0 12/22/2019 1855   GLUCOSEU NEGATIVE 12/22/2019 1855   HGBUR NEGATIVE 12/22/2019 1855   HGBUR negative 01/04/2010 1409   BILIRUBINUR NEGATIVE 12/22/2019 1855   KETONESUR NEGATIVE 12/22/2019 1855   PROTEINUR NEGATIVE 12/22/2019 1855   UROBILINOGEN 0.2 03/30/2012 2227   NITRITE NEGATIVE 12/22/2019 1855   LEUKOCYTESUR NEGATIVE 12/22/2019 1855    Radiological Exams on Admission: CT ABDOMEN PELVIS W CONTRAST  Result Date: 12/22/2019 CLINICAL DATA:  Acute nonlocalized abdominal pain EXAM: CT ABDOMEN AND PELVIS WITH CONTRAST TECHNIQUE: Multidetector CT imaging of the abdomen and pelvis was performed using the standard protocol following bolus administration of intravenous contrast. CONTRAST:  82mL OMNIPAQUE IOHEXOL 300 MG/ML  SOLN COMPARISON:  CT 10/06/2017 FINDINGS: Lower chest: Patchy consolidative and tree-in-bud nodularity seen in the lung bases. More bandlike opacities in both lower lobes likely reflecting subsegmental atelectatic change and/or scarring. Some abundant subpleural fat is noted as well. Cardiac size. No pericardial effusion. Calcifications of the coronary arteries and aortic leaflets. Hepatobiliary: No worrisome focal liver lesions. Smooth liver surface contour. Normal hepatic attenuation. Prominent fold at the gallbladder body. No visible calcified gallstones within the gallbladder or biliary tree. No biliary ductal dilatation. Pancreas: Unremarkable. No pancreatic ductal dilatation or surrounding inflammatory changes. Spleen: Normal splenic size. No concerning splenic lesions. Scattered punctate calcifications throughout the spleen likely reflecting sequela of remote granulomatous disease. Adrenals/Urinary Tract: Normal adrenal glands. Stable mild bilateral symmetric  perinephric stranding, a nonspecific finding though may correlate with either age or decreased renal function. Kidneys enhance uniformly and excrete symmetrically. No visible concerning renal lesion. No urolithiasis or frank hydronephrosis. There is some mild circumferential bladder wall thickening. Some of which may be related to underdistention though faint perivesicular hazy stranding is present and could suggest an underlying cystitis. Stomach/Bowel: Distal esophagus, stomach and duodenum are unremarkable. No small bowel thickening or dilatation. A normal appendix is visualized. No colonic dilatation or wall thickening. No evidence of bowel obstruction. Vascular/Lymphatic: Atherosclerotic calcifications within the abdominal aorta and branch vessels. No aneurysm or ectasia. No enlarged abdominopelvic lymph nodes. Reproductive: The prostate and seminal vesicles are unremarkable. Other: Stable appearance of a lobular fat containing ventral hernia without evidence of strangulation of the herniated fat. No bowel containing hernias. No abdominopelvic free air or fluid. Musculoskeletal: Multilevel degenerative  changes are present in the imaged portions of the spine. Likely acute to subacute appearing anterior wedging compression deformity of the L1 vertebral body with approximately 40% height loss anteriorly and some mild surrounding thickening. Vacuum phenomenon noted in the adjacent disc. 2 mm retrolisthesis of L4 on 5 and 3 mm of grade 1 anterolisthesis L5 on S1 with associated bilateral L5 pars defects, similar to comparison. No other acute or suspicious osseous abnormalities. IMPRESSION: 1. Patchy consolidative and tree-in-bud nodularity in the lung bases, concerning for acute infection or inflammation. 2. Likely acute to subacute appearing anterior wedging compression deformity of the L1 vertebral body with approximately 40% height loss anteriorly and some mild surrounding thickening. Correlate for point  tenderness. 3. Mild circumferential bladder wall thickening with faint perivesicular hazy stranding, suggestive of an underlying cystitis, consider correlation with urinalysis. Stable mild nonspecific perinephric stranding could be related to diminished renal function or advanced age though ascending infection is not excluded. 4. Stable appearance of a lobular fat containing ventral hernia without evidence of strangulation of the herniated fat. 5. Aortic Atherosclerosis (ICD10-I70.0). Electronically Signed   By: Lovena Le M.D.   On: 12/22/2019 16:21   DG Chest Portable 1 View  Result Date: 12/22/2019 CLINICAL DATA:  Leukocytosis EXAM: PORTABLE CHEST 1 VIEW COMPARISON:  Chest radiograph dated 09/10/2019. FINDINGS: The heart size is normal. Vascular calcifications are seen in the aortic arch. Mild right basilar atelectasis/airspace disease is noted. The left lung is clear. There is no pleural effusion or pneumothorax. The visualized skeletal structures are unremarkable. IMPRESSION: Mild right basilar atelectasis/airspace disease. Electronically Signed   By: Zerita Boers M.D.   On: 12/22/2019 17:37    EKG: Independently reviewed. n/a  Assessment/Plan Patient is a   is a 79 y.o. male with medical history significant of  HTN intermittently labile, Grade I diastolic dysfunction , chronic venostasis, HLD, asthma, morbid obesity, DMII, osa on cpap , chronic respiratory failure on 2L O2, history of syncopal episodes,falls, compression fracture thoracic spine,chronic venostasis on lasix who presents form NH sent int due to concern initially for abnormal imaging  At NH noting possible bowel obstruction with symptoms of n/v and abdominal pain.   HCAP with acute hypoxemia  -place on broad spectrum antibiotics per protocol  -pulmonary toilet/ nebs prn  -f/u on urinary ag/ sputum blood cultures    AKI -due to volume depletion  -hold nephrotoxic medications  - ivfs overnight   L 1 subacute /acute  compression fx -supportive care / PT/OT   Chronic Hypoxic respiratory failure  -on chronic 2L with no new requirements    Asthma -no acute exacerbation  -not on controller medications/ nebs prn   OSA -cpap qhs as able   DMII -place on in house iss/fs  Protocol  -fs currently stable  -check a1c   Anemia  -normocytic  -per patient has dark stools , trend notes continue steady decrease on labs  -fob/ anemia labs  -monitor H/H   HTN -resume home medications   Hx of orthostatsis -on midodrine standing at NH -changed to prn on admit , monitor need for standing dosing    Morbid obesity   Hx of TIA -Continue asa  Chronic venostatsis  -on lasix  -holding due to aki  Diastolic dysfunction  -no noted decompensation -daily wt/ strict I/o   DVT prophylaxis:  scd  Code Status: FULL Family Communication: n/a NO family at beside  Disposition Plan: patient expected to be admitted greater than 2 midnights Consults called: N/a  Admission status: inpatient   Clance Boll MD [facility]  If 7PM-7AM, please contact night-coverage [email or web address] Password [email or web address]  12/22/2019, 8:40 PM

## 2019-12-23 DIAGNOSIS — J189 Pneumonia, unspecified organism: Secondary | ICD-10-CM | POA: Diagnosis present

## 2019-12-23 DIAGNOSIS — N179 Acute kidney failure, unspecified: Secondary | ICD-10-CM

## 2019-12-23 DIAGNOSIS — E876 Hypokalemia: Secondary | ICD-10-CM

## 2019-12-23 LAB — CBC WITH DIFFERENTIAL/PLATELET
Abs Immature Granulocytes: 0.06 10*3/uL (ref 0.00–0.07)
Basophils Absolute: 0.1 10*3/uL (ref 0.0–0.1)
Basophils Relative: 1 %
Eosinophils Absolute: 0.1 10*3/uL (ref 0.0–0.5)
Eosinophils Relative: 1 %
HCT: 31.4 % — ABNORMAL LOW (ref 39.0–52.0)
Hemoglobin: 9.8 g/dL — ABNORMAL LOW (ref 13.0–17.0)
Immature Granulocytes: 1 %
Lymphocytes Relative: 18 %
Lymphs Abs: 2.1 10*3/uL (ref 0.7–4.0)
MCH: 25.9 pg — ABNORMAL LOW (ref 26.0–34.0)
MCHC: 31.2 g/dL (ref 30.0–36.0)
MCV: 83.1 fL (ref 80.0–100.0)
Monocytes Absolute: 1.2 10*3/uL — ABNORMAL HIGH (ref 0.1–1.0)
Monocytes Relative: 10 %
Neutro Abs: 8.4 10*3/uL — ABNORMAL HIGH (ref 1.7–7.7)
Neutrophils Relative %: 69 %
Platelets: 506 10*3/uL — ABNORMAL HIGH (ref 150–400)
RBC: 3.78 MIL/uL — ABNORMAL LOW (ref 4.22–5.81)
RDW: 15.3 % (ref 11.5–15.5)
WBC: 12 10*3/uL — ABNORMAL HIGH (ref 4.0–10.5)
nRBC: 0 % (ref 0.0–0.2)

## 2019-12-23 LAB — COMPREHENSIVE METABOLIC PANEL
ALT: 13 U/L (ref 0–44)
AST: 15 U/L (ref 15–41)
Albumin: 2.9 g/dL — ABNORMAL LOW (ref 3.5–5.0)
Alkaline Phosphatase: 62 U/L (ref 38–126)
Anion gap: 15 (ref 5–15)
BUN: 41 mg/dL — ABNORMAL HIGH (ref 8–23)
CO2: 27 mmol/L (ref 22–32)
Calcium: 9.3 mg/dL (ref 8.9–10.3)
Chloride: 96 mmol/L — ABNORMAL LOW (ref 98–111)
Creatinine, Ser: 1.4 mg/dL — ABNORMAL HIGH (ref 0.61–1.24)
GFR calc Af Amer: 56 mL/min — ABNORMAL LOW (ref >60)
GFR calc non Af Amer: 48 mL/min — ABNORMAL LOW (ref >60)
Glucose, Bld: 101 mg/dL — ABNORMAL HIGH (ref 70–99)
Potassium: 3.1 mmol/L — ABNORMAL LOW (ref 3.5–5.1)
Sodium: 138 mmol/L (ref 135–145)
Total Bilirubin: 0.6 mg/dL (ref 0.3–1.2)
Total Protein: 6.7 g/dL (ref 6.5–8.1)

## 2019-12-23 LAB — HEMOGLOBIN A1C
Hgb A1c MFr Bld: 5.7 % — ABNORMAL HIGH (ref 4.8–5.6)
Mean Plasma Glucose: 116.89 mg/dL

## 2019-12-23 LAB — IRON AND TIBC
Iron: 29 ug/dL — ABNORMAL LOW (ref 45–182)
Saturation Ratios: 10 % — ABNORMAL LOW (ref 17.9–39.5)
TIBC: 277 ug/dL (ref 250–450)
UIBC: 248 ug/dL

## 2019-12-23 LAB — GLUCOSE, CAPILLARY
Glucose-Capillary: 92 mg/dL (ref 70–99)
Glucose-Capillary: 102 mg/dL — ABNORMAL HIGH (ref 70–99)
Glucose-Capillary: 104 mg/dL — ABNORMAL HIGH (ref 70–99)
Glucose-Capillary: 108 mg/dL — ABNORMAL HIGH (ref 70–99)

## 2019-12-23 LAB — RESP PANEL BY RT PCR (RSV, FLU A&B, COVID)
Influenza A by PCR: NEGATIVE
Influenza B by PCR: NEGATIVE
Respiratory Syncytial Virus by PCR: NEGATIVE
SARS Coronavirus 2 by RT PCR: NEGATIVE

## 2019-12-23 LAB — URINE CULTURE

## 2019-12-23 LAB — FERRITIN: Ferritin: 158 ng/mL (ref 24–336)

## 2019-12-23 LAB — MRSA PCR SCREENING: MRSA by PCR: NEGATIVE

## 2019-12-23 MED ORDER — PNEUMOCOCCAL VAC POLYVALENT 25 MCG/0.5ML IJ INJ
0.5000 mL | INJECTION | INTRAMUSCULAR | Status: AC
  Administered 2019-12-24: 0.5 mL via INTRAMUSCULAR
  Filled 2019-12-23: qty 0.5

## 2019-12-23 MED ORDER — POTASSIUM CHLORIDE 10 MEQ/100ML IV SOLN
10.0000 meq | INTRAVENOUS | Status: AC
  Administered 2019-12-23 (×3): 10 meq via INTRAVENOUS
  Filled 2019-12-23 (×3): qty 100

## 2019-12-23 MED ORDER — PHENOL 1.4 % MT LIQD: 1.0000 | OROMUCOSAL | Status: DC | PRN

## 2019-12-23 MED ORDER — ALUM & MAG HYDROXIDE-SIMETH 200-200-20 MG/5ML PO SUSP
30.0000 mL | ORAL | Status: DC | PRN
  Administered 2019-12-23: 30 mL via ORAL
  Filled 2019-12-23: qty 30

## 2019-12-23 NOTE — TOC Initial Note (Signed)
Transition of Care [facility]) - Initial/Assessment Note    Patient Details  Name: Brian Silva MRN: [id number] Date of Birth: 1940-12-12  [facility]) CM/SW Contact:    Joanne Chars, LCSW Phone Number: 12/23/2019, 4:07 PM  Clinical Narrative:      CSW spoke with pt over the phone for assesment ([place] working remotely)        Discussed potential DC tomorrow, pt long term resident at [facility] and wishes to return.  Pt reports he is seen by MD at the facility, reports he is vaccinated.  Pt declined to give permission for CSW to speak with contact listed on face sheet.      Expected Discharge Plan: Assisted Living Barriers to Discharge: Continued Medical Work up   Patient Goals and CMS Choice Patient states their goals for this hospitalization and ongoing recovery are:: get out of the hospital      Expected Discharge Plan and Services Expected Discharge Plan: Assisted Living     Post Acute Care Choice:  (back to [facility]) Living arrangements for the past 2 months: [place]                                      Prior Living Arrangements/Services Living arrangements for the past 2 months: [place] Lives with:: Facility Resident   Do you feel safe going back to the place where you live?: Yes      Need for Family Participation in Patient Care: No (Comment) Care giver support system in place?: Yes (comment) Current home services: Home PT Criminal Activity/Legal Involvement Pertinent to Current Situation/Hospitalization: No - Comment as needed  Activities of Daily Living Home Assistive Devices/Equipment: Other (Comment) (equipment at snf: specialty beds, lift, walker) ADL Screening (condition at time of admission) Patient's cognitive ability adequate to safely complete daily activities?: Yes Is the patient deaf or have difficulty hearing?: No Does the patient have difficulty seeing, even when wearing glasses/contacts?:  No Does the patient have difficulty concentrating, remembering, or making decisions?: No Patient able to express need for assistance with ADLs?: Yes Does the patient have difficulty dressing or bathing?: Yes Independently performs ADLs?: No Communication: Independent Dressing (OT): Needs assistance Grooming: Needs assistance Feeding: Independent Bathing: Needs assistance Toileting: Needs assistance In/Out Bed: Needs assistance Walks in Home: Dependent Is this a change from baseline?: Pre-admission baseline Does the patient have difficulty walking or climbing stairs?: Yes Weakness of Legs: Both (recent lumbar fracture) Weakness of Arms/Hands: Right (recent wrist sprain with brace)  Permission Sought/Granted   Permission granted to share information with : Yes, Verbal Permission Granted  Share Information with NAME: Pt denied permission to talk with contact on face sheet  Permission granted to share info w AGENCY: [facility]        Emotional Assessment Appearance::  (initial assessment done remotely) Attitude/Demeanor/Rapport: Engaged Affect (typically observed): Pleasant Orientation: : Oriented to Self, Oriented to Place, Oriented to  Time, Oriented to Situation Alcohol / Substance Use: Not Applicable Psych Involvement: No (comment)  Admission diagnosis:  CAP (community acquired pneumonia) [J18.9] AKI (acute kidney injury) ([place]) [N17.9] HCAP (healthcare-associated pneumonia) [J18.9] Community acquired pneumonia of right lung, unspecified part of lung [J18.9] Patient Active Problem List   Diagnosis Date Noted  . HCAP (healthcare-associated pneumonia) 12/23/2019  . CAP (community acquired pneumonia) 12/22/2019  . Compression fracture of body of thoracic vertebra ([place]) 09/10/2019  . Fall at  home, initial encounter 09/10/2019  . AKI (acute kidney injury) ([place]) 01/17/2018  . Syncope, vasovagal 01/17/2018  . Diabetes mellitus type II, non insulin dependent ([place]) 01/17/2018  .  Hyponatremia 01/17/2018  . Syncope 01/17/2018  . Depression 10/25/2015  . Hyperlipidemia 10/25/2015  . TINEA PEDIS 01/04/2010  . TINEA VERSICOLOR 01/04/2010  . URI 01/04/2010  . HYPERGLYCEMIA 12/02/2008  . ABDOMINAL WALL HERNIA 12/01/2007  . OBESITY, MORBID 04/04/2007  . SLEEP APNEA, OBSTRUCTIVE 04/04/2007  . LOW BACK PAIN, CHRONIC 12/07/2004  . HYPERLIPIDEMIA 08/12/2002  . ALLERGIC RHINITIS, SEASONAL 08/12/2002  . Essential hypertension 04/01/2000  . Asthma 10/11/1997  . SUPERFICIAL PHLEBITIS 05/06/1990   PCP:  Benito Mccreedy, MD Pharmacy:   [id number], [place]. [place] [address] Phone: [phone] Fax: [phone]     Social Determinants of Health (SDOH) Interventions    Readmission Risk Interventions No flowsheet data found.

## 2019-12-23 NOTE — Progress Notes (Signed)
[facility]  PROGRESS NOTE  Brian Silva WNU:[id number] DOB: 12/24/1940 DOA: 12/22/2019 PCP: Benito Mccreedy, MD   Brief HPI:   79 year old male with history of hypertension, grade 1 diastolic dysfunction, chronic venous stasis, hyperlipidemia, asthma, morbid obesity, diabetes type 2, OSA on CPAP, chronic respiratory failure on 2 L of oxygen, history of syncopal episodes, falls, was sent from skilled nursing facility due to nausea vomiting abdominal pain for 3 days.  There was concern for bowel obstruction due to abnormal imaging.  Patient was sent to the ED for persistent nausea vomiting and diarrhea.  In the ED chest x-ray showed bilateral lower lobe infiltrates and no abdominal findings.  Patient admitted with a diagnosis of community-acquired pneumonia.    Subjective   Patient seen and examined, denies abdominal pain.  Denies coughing up any phlegm.  No shortness of breath.   Assessment/Plan:     1. Nausea/vomiting/abdominal pain-resolved, abdominal CT scan was negative for bowel obstruction. 2. Community-acquired pneumonia-seen on CT abdomen/pelvis started on ceftriaxone and Zithromax.  WBC is down to 12,000.  Strep pneumo urinary antigen is negative.  Follow blood culture results. 3. Hypokalemia-potassium 3.1, replace potassium and follow BMP in am. 4. Chronic hypoxic respiratory failure-patient chronically on 2 L/min of oxygen.  No new requirements. 5. L1 subacute/acute compression fracture-PT OT consulted, pain control. 6. History of asthma-no acute exacerbation.  Continue as needed nebs. 7. OSA-continue CPAP nightly 8. Diabetes mellitus type 2-continue sliding scale insulin NovoLog.  CBG well controlled. 9. Hypertension-blood pressure is stable, continue body pain. 10. Normocytic anemia-hemoglobin has improved to 9.8 today.  Continue to monitor H&H. 11. History of TIA-continue aspirin 12. Chronic venous stasis-Lasix on hold due to AKI.  Follow BMP in  am. 13. History of  diastolic dysfunction-euvolemic.     COVID-19 Labs  Recent Labs    12/23/19 0511  FERRITIN 158    Lab Results  Component Value Date   SARSCOV2NAA NEGATIVE 12/23/2019   SARSCOV2NAA NEGATIVE 12/22/2019   [place] NEGATIVE 09/14/2019   [place] NEGATIVE 09/10/2019     Scheduled medications:   . amLODipine  10 mg Oral Daily  . aspirin EC  81 mg Oral Daily  . atorvastatin  20 mg Oral QHS  . cholecalciferol  5,000 Units Oral Daily  . DULoxetine  30 mg Oral Daily  . fluticasone  1 spray Each Nare Daily  . insulin aspart  0-9 Units Subcutaneous TID WC  . insulin aspart  3 Units Subcutaneous TID WC  . nystatin  1 application Topical BID  . [START ON 12/24/2019] pneumococcal 23 valent vaccine  0.5 mL Intramuscular Tomorrow-1000  . pregabalin  50 mg Oral TID         CBG: Recent Labs  Lab 12/23/19 0744 12/23/19 1132  GLUCAP 92 102*    SpO2: 100 %    CBC: Recent Labs  Lab 12/22/19 1444 12/23/19 0511  WBC 22.2* 12.0*  NEUTROABS 18.3* 8.4*  HGB 8.4* 9.8*  HCT 26.8* 31.4*  MCV 84.3 83.1  PLT 665* 506*    Basic Metabolic Panel: Recent Labs  Lab 12/22/19 1444 12/23/19 0511  NA 138 138  K 3.6 3.1*  CL 95* 96*  CO2 31 27  GLUCOSE 127* 101*  BUN 49* 41*  CREATININE 1.55* 1.40*  CALCIUM 9.5 9.3     Liver Function Tests: Recent Labs  Lab 12/22/19 1444 12/23/19 0511  AST 18 15  ALT 14 13  ALKPHOS 62 62  BILITOT 0.5 0.6  PROT 7.1 6.7  ALBUMIN 3.1* 2.9*     Antibiotics: Anti-infectives (From admission, onward)   Start     Dose/Rate Route Frequency Ordered Stop   12/22/19 2200  cefTRIAXone (ROCEPHIN) 2 g in sodium chloride 0.9 % 100 mL IVPB        2 g 200 mL/hr over 30 Minutes Intravenous Every 24 hours 12/22/19 2129 12/27/19 2159   12/22/19 2200  azithromycin (ZITHROMAX) 500 mg in sodium chloride 0.9 % 250 mL IVPB        500 mg 250 mL/hr over 60 Minutes Intravenous Every 24 hours 12/22/19 2129 12/27/19 2159    12/22/19 2030  cefTRIAXone (ROCEPHIN) 1 g in sodium chloride 0.9 % 100 mL IVPB  Status:  Discontinued        1 g 200 mL/hr over 30 Minutes Intravenous  Once 12/22/19 2021 12/22/19 2234   12/22/19 2030  azithromycin (ZITHROMAX) 500 mg in sodium chloride 0.9 % 250 mL IVPB  Status:  Discontinued        500 mg 250 mL/hr over 60 Minutes Intravenous  Once 12/22/19 2021 12/22/19 2132       DVT prophylaxis: SCDs  Code Status: Full code  Family Communication: No family at bedside    Status is: Inpatient  Dispo: The patient is from: Skilled nursing facility              Anticipated d/c is to: Skilled nursing facility              Anticipated d/c date is: 12/24/2019              Patient currently not medically stable for discharge  Barrier to discharge-IV antibiotics for pneumonia, potassium being replaced     Consultants:    Procedures:     Objective   Vitals:   12/23/19 0000 12/23/19 0100 12/23/19 0201 12/23/19 0249  BP: 140/64 (!) 141/55 115/81 (!) 153/57  Pulse: 80 87 78 81  Resp: 18 (!) 21 (!) 25 20  Temp:   98.2 F (36.8 C) 98.8 F (37.1 C)  TempSrc:   Oral Oral  SpO2: 100% 98% 97% 100%  Weight:    (!) 150.5 kg  Height:    6' (1.829 m)    Intake/Output Summary (Last 24 hours) at 12/23/2019 1406 Last data filed at 12/23/2019 0715 Gross per 24 hour  Intake 1330 ml  Output 750 ml  Net 580 ml    08/30 1901 - 09/01 0700 In: 1090 [P.O.:440; I.V.:300] Out: 750 [Urine:750]  Filed Weights   12/23/19 0249  Weight: (!) 150.5 kg    Physical Examination:    General: Appears in no acute distress  Cardiovascular: S1-S2, regular, no murmur auscultated  Respiratory: Clear to auscultation bilaterally  Abdomen: Abdomen is soft, nontender, no organomegaly  Extremities: No edema in the lower extremities  Neurologic: Alert, oriented x3, intact insight and judgment    Data Reviewed:   Recent Results (from the past 240 hour(s))  Blood culture (routine x 2)      Status: None (Preliminary result)   Collection Time: 12/22/19  9:12 PM   Specimen: BLOOD  Result Value Ref Range Status   Specimen Description BLOOD SITE NOT SPECIFIED  Final   Special Requests   Final    BOTTLES DRAWN AEROBIC AND ANAEROBIC Blood Culture adequate volume   Culture   Final    NO GROWTH < 12 HOURS Performed at [facility], [address] [place], [place] [address]    Report Status  PENDING  Incomplete  SARS Coronavirus 2 by RT PCR (hospital order, performed in [facility] hospital lab) Nasopharyngeal Nasopharyngeal Swab     Status: None   Collection Time: 12/22/19  9:12 PM   Specimen: Nasopharyngeal Swab  Result Value Ref Range Status   SARS Coronavirus 2 NEGATIVE NEGATIVE Final    Comment: (NOTE) SARS-CoV-2 target nucleic acids are NOT DETECTED.  The SARS-CoV-2 RNA is generally detectable in upper and lower respiratory specimens during the acute phase of infection. The lowest concentration of SARS-CoV-2 viral copies this assay can detect is 250 copies / mL. A negative result does not preclude SARS-CoV-2 infection and should not be used as the sole basis for treatment or other patient management decisions.  A negative result may occur with improper specimen collection / handling, submission of specimen other than nasopharyngeal swab, presence of viral mutation(s) within the areas targeted by this assay, and inadequate number of viral copies (<250 copies / mL). A negative result must be combined with clinical observations, patient history, and epidemiological information.  Fact Sheet for Patients:   StrictlyIdeas.no  Fact Sheet for Healthcare Providers: BankingDealers.co.za  This test is not yet approved or  cleared by the [place] FDA and has been authorized for detection and/or diagnosis of SARS-CoV-2 by FDA under an Emergency Use Authorization (EUA).  This EUA will remain in effect (meaning this test  can be used) for the duration of the COVID-19 declaration under Section 564(b)(1) of the Act, 21 U.S.C. section 360bbb-3(b)(1), unless the authorization is terminated or revoked sooner.  Performed at [facility], [place] [address] [place], [place] [address]   Blood culture (routine x 2)     Status: None (Preliminary result)   Collection Time: 12/22/19  9:50 PM   Specimen: BLOOD  Result Value Ref Range Status   Specimen Description BLOOD SITE NOT SPECIFIED  Final   Special Requests   Final    BOTTLES DRAWN AEROBIC ONLY Blood Culture results may not be optimal due to an inadequate volume of blood received in culture bottles   Culture   Final    NO GROWTH < 12 HOURS Performed at [facility], [place] [address], [place], [place] [address]    Report Status PENDING  Incomplete  MRSA PCR Screening     Status: None   Collection Time: 12/23/19  6:20 AM   Specimen: Nasopharyngeal  Result Value Ref Range Status   MRSA by PCR NEGATIVE NEGATIVE Final    Comment:        The GeneXpert MRSA Assay (FDA approved for NASAL specimens only), is one component of a comprehensive MRSA colonization surveillance program. It is not intended to diagnose MRSA infection nor to guide or monitor treatment for MRSA infections. Performed at [facility], [place] [address], [place], [place] [address]   Resp Panel by RT PCR (RSV, Flu A&B, Covid) -     Status: None   Collection Time: 12/23/19  6:20 AM  Result Value Ref Range Status   SARS Coronavirus 2 by RT PCR NEGATIVE NEGATIVE Final    Comment: (NOTE) SARS-CoV-2 target nucleic acids are NOT DETECTED.  The SARS-CoV-2 RNA is generally detectable in upper respiratoy specimens during the acute phase of infection. The lowest concentration of SARS-CoV-2 viral copies this assay can detect is 131 copies/mL. A negative result does not preclude SARS-Cov-2 infection and should not be used as the sole basis for treatment or other patient management  decisions. A negative  result may occur with  improper specimen collection/handling, submission of specimen other than nasopharyngeal swab, presence of viral mutation(s) within the areas targeted by this assay, and inadequate number of viral copies (<131 copies/mL). A negative result must be combined with clinical observations, patient history, and epidemiological information. The expected result is Negative.  Fact Sheet for Patients:  PinkCheek.be  Fact Sheet for Healthcare Providers:  GravelBags.it  This test is no t yet approved or cleared by the [place] FDA and  has been authorized for detection and/or diagnosis of SARS-CoV-2 by FDA under an Emergency Use Authorization (EUA). This EUA will remain  in effect (meaning this test can be used) for the duration of the COVID-19 declaration under Section 564(b)(1) of the Act, 21 U.S.C. section 360bbb-3(b)(1), unless the authorization is terminated or revoked sooner.     Influenza A by PCR NEGATIVE NEGATIVE Final   Influenza B by PCR NEGATIVE NEGATIVE Final    Comment: (NOTE) The Xpert Xpress SARS-CoV-2/FLU/RSV assay is intended as an aid in  the diagnosis of influenza from Nasopharyngeal swab specimens and  should not be used as a sole basis for treatment. Nasal washings and  aspirates are unacceptable for Xpert Xpress SARS-CoV-2/FLU/RSV  testing.  Fact Sheet for Patients: PinkCheek.be  Fact Sheet for Healthcare Providers: GravelBags.it  This test is not yet approved or cleared by the [place] FDA and  has been authorized for detection and/or diagnosis of SARS-CoV-2 by  FDA under an Emergency Use Authorization (EUA). This EUA will remain  in effect (meaning this test can be used) for the duration of the  Covid-19 declaration under Section 564(b)(1) of the Act, 21  U.S.C. section 360bbb-3(b)(1), unless the  authorization is  terminated or revoked.    Respiratory Syncytial Virus by PCR NEGATIVE NEGATIVE Final    Comment: (NOTE) Fact Sheet for Patients: PinkCheek.be  Fact Sheet for Healthcare Providers: GravelBags.it  This test is not yet approved or cleared by the [place] FDA and  has been authorized for detection and/or diagnosis of SARS-CoV-2 by  FDA under an Emergency Use Authorization (EUA). This EUA will remain  in effect (meaning this test can be used) for the duration of the  COVID-19 declaration under Section 564(b)(1) of the Act, 21 U.S.C.  section 360bbb-3(b)(1), unless the authorization is terminated or  revoked. Performed at [facility], [place] [address], [place], [place] [address]     Recent Labs  Lab 12/22/19 1444  LIPASE 35   No results for input(s): AMMONIA in the last 168 hours.  Cardiac Enzymes: No results for input(s): CKTOTAL, CKMB, CKMBINDEX, TROPONINI in the last 168 hours. BNP (last 3 results) No results for input(s): BNP in the last 8760 hours.  ProBNP (last 3 results) No results for input(s): PROBNP in the last 8760 hours.  Studies:  CT ABDOMEN PELVIS W CONTRAST  Result Date: 12/22/2019 CLINICAL DATA:  Acute nonlocalized abdominal pain EXAM: CT ABDOMEN AND PELVIS WITH CONTRAST TECHNIQUE: Multidetector CT imaging of the abdomen and pelvis was performed using the standard protocol following bolus administration of intravenous contrast. CONTRAST:  4mL OMNIPAQUE IOHEXOL 300 MG/ML  SOLN COMPARISON:  CT 10/06/2017 FINDINGS: Lower chest: Patchy consolidative and tree-in-bud nodularity seen in the lung bases. More bandlike opacities in both lower lobes likely reflecting subsegmental atelectatic change and/or scarring. Some abundant subpleural fat is noted as well. Cardiac size. No pericardial effusion. Calcifications of the coronary arteries and aortic leaflets. Hepatobiliary: No worrisome focal  liver lesions. Smooth liver  surface contour. Normal hepatic attenuation. Prominent fold at the gallbladder body. No visible calcified gallstones within the gallbladder or biliary tree. No biliary ductal dilatation. Pancreas: Unremarkable. No pancreatic ductal dilatation or surrounding inflammatory changes. Spleen: Normal splenic size. No concerning splenic lesions. Scattered punctate calcifications throughout the spleen likely reflecting sequela of remote granulomatous disease. Adrenals/Urinary Tract: Normal adrenal glands. Stable mild bilateral symmetric perinephric stranding, a nonspecific finding though may correlate with either age or decreased renal function. Kidneys enhance uniformly and excrete symmetrically. No visible concerning renal lesion. No urolithiasis or frank hydronephrosis. There is some mild circumferential bladder wall thickening. Some of which may be related to underdistention though faint perivesicular hazy stranding is present and could suggest an underlying cystitis. Stomach/Bowel: Distal esophagus, stomach and duodenum are unremarkable. No small bowel thickening or dilatation. A normal appendix is visualized. No colonic dilatation or wall thickening. No evidence of bowel obstruction. Vascular/Lymphatic: Atherosclerotic calcifications within the abdominal aorta and branch vessels. No aneurysm or ectasia. No enlarged abdominopelvic lymph nodes. Reproductive: The prostate and seminal vesicles are unremarkable. Other: Stable appearance of a lobular fat containing ventral hernia without evidence of strangulation of the herniated fat. No bowel containing hernias. No abdominopelvic free air or fluid. Musculoskeletal: Multilevel degenerative changes are present in the imaged portions of the spine. Likely acute to subacute appearing anterior wedging compression deformity of the L1 vertebral body with approximately 40% height loss anteriorly and some mild surrounding thickening. Vacuum phenomenon  noted in the adjacent disc. 2 mm retrolisthesis of L4 on 5 and 3 mm of grade 1 anterolisthesis L5 on S1 with associated bilateral L5 pars defects, similar to comparison. No other acute or suspicious osseous abnormalities. IMPRESSION: 1. Patchy consolidative and tree-in-bud nodularity in the lung bases, concerning for acute infection or inflammation. 2. Likely acute to subacute appearing anterior wedging compression deformity of the L1 vertebral body with approximately 40% height loss anteriorly and some mild surrounding thickening. Correlate for point tenderness. 3. Mild circumferential bladder wall thickening with faint perivesicular hazy stranding, suggestive of an underlying cystitis, consider correlation with urinalysis. Stable mild nonspecific perinephric stranding could be related to diminished renal function or advanced age though ascending infection is not excluded. 4. Stable appearance of a lobular fat containing ventral hernia without evidence of strangulation of the herniated fat. 5. Aortic Atherosclerosis (ICD10-I70.0). Electronically Signed   By: Lovena Le M.D.   On: 12/22/2019 16:21   DG Chest Portable 1 View  Result Date: 12/22/2019 CLINICAL DATA:  Leukocytosis EXAM: PORTABLE CHEST 1 VIEW COMPARISON:  Chest radiograph dated 09/10/2019. FINDINGS: The heart size is normal. Vascular calcifications are seen in the aortic arch. Mild right basilar atelectasis/airspace disease is noted. The left lung is clear. There is no pleural effusion or pneumothorax. The visualized skeletal structures are unremarkable. IMPRESSION: Mild right basilar atelectasis/airspace disease. Electronically Signed   By: Zerita Boers M.D.   On: 12/22/2019 17:37       [place]   [facility] If 7PM-7AM, please contact night-coverage at [email or web address], Office  [phone]   12/23/2019, 2:06 PM  LOS: 1 day

## 2019-12-24 DIAGNOSIS — E785 Hyperlipidemia, unspecified: Secondary | ICD-10-CM | POA: Diagnosis not present

## 2019-12-24 DIAGNOSIS — R109 Unspecified abdominal pain: Secondary | ICD-10-CM | POA: Diagnosis not present

## 2019-12-24 DIAGNOSIS — Z7401 Bed confinement status: Secondary | ICD-10-CM | POA: Diagnosis not present

## 2019-12-24 DIAGNOSIS — Z23 Encounter for immunization: Secondary | ICD-10-CM | POA: Diagnosis not present

## 2019-12-24 DIAGNOSIS — S22080D Wedge compression fracture of T11-T12 vertebra, subsequent encounter for fracture with routine healing: Secondary | ICD-10-CM | POA: Diagnosis not present

## 2019-12-24 DIAGNOSIS — F320 Major depressive disorder, single episode, mild: Secondary | ICD-10-CM | POA: Diagnosis not present

## 2019-12-24 DIAGNOSIS — M25532 Pain in left wrist: Secondary | ICD-10-CM | POA: Diagnosis not present

## 2019-12-24 DIAGNOSIS — J9610 Chronic respiratory failure, unspecified whether with hypoxia or hypercapnia: Secondary | ICD-10-CM | POA: Diagnosis not present

## 2019-12-24 DIAGNOSIS — R2689 Other abnormalities of gait and mobility: Secondary | ICD-10-CM | POA: Diagnosis not present

## 2019-12-24 DIAGNOSIS — G4733 Obstructive sleep apnea (adult) (pediatric): Secondary | ICD-10-CM | POA: Diagnosis not present

## 2019-12-24 DIAGNOSIS — I509 Heart failure, unspecified: Secondary | ICD-10-CM | POA: Diagnosis not present

## 2019-12-24 DIAGNOSIS — G629 Polyneuropathy, unspecified: Secondary | ICD-10-CM | POA: Diagnosis not present

## 2019-12-24 DIAGNOSIS — R112 Nausea with vomiting, unspecified: Secondary | ICD-10-CM | POA: Diagnosis not present

## 2019-12-24 DIAGNOSIS — E1165 Type 2 diabetes mellitus with hyperglycemia: Secondary | ICD-10-CM | POA: Diagnosis not present

## 2019-12-24 DIAGNOSIS — R41841 Cognitive communication deficit: Secondary | ICD-10-CM | POA: Diagnosis not present

## 2019-12-24 DIAGNOSIS — N179 Acute kidney failure, unspecified: Secondary | ICD-10-CM | POA: Diagnosis not present

## 2019-12-24 DIAGNOSIS — I700 Atherosclerosis of aorta: Secondary | ICD-10-CM | POA: Diagnosis not present

## 2019-12-24 DIAGNOSIS — M2550 Pain in unspecified joint: Secondary | ICD-10-CM | POA: Diagnosis not present

## 2019-12-24 DIAGNOSIS — J45909 Unspecified asthma, uncomplicated: Secondary | ICD-10-CM | POA: Diagnosis not present

## 2019-12-24 DIAGNOSIS — R202 Paresthesia of skin: Secondary | ICD-10-CM | POA: Diagnosis not present

## 2019-12-24 DIAGNOSIS — Z20828 Contact with and (suspected) exposure to other viral communicable diseases: Secondary | ICD-10-CM | POA: Diagnosis not present

## 2019-12-24 DIAGNOSIS — R1084 Generalized abdominal pain: Secondary | ICD-10-CM | POA: Diagnosis not present

## 2019-12-24 DIAGNOSIS — R2681 Unsteadiness on feet: Secondary | ICD-10-CM | POA: Diagnosis not present

## 2019-12-24 DIAGNOSIS — R197 Diarrhea, unspecified: Secondary | ICD-10-CM | POA: Diagnosis not present

## 2019-12-24 DIAGNOSIS — D649 Anemia, unspecified: Secondary | ICD-10-CM | POA: Diagnosis not present

## 2019-12-24 DIAGNOSIS — E1169 Type 2 diabetes mellitus with other specified complication: Secondary | ICD-10-CM | POA: Diagnosis not present

## 2019-12-24 DIAGNOSIS — M79642 Pain in left hand: Secondary | ICD-10-CM | POA: Diagnosis not present

## 2019-12-24 DIAGNOSIS — E119 Type 2 diabetes mellitus without complications: Secondary | ICD-10-CM | POA: Diagnosis not present

## 2019-12-24 DIAGNOSIS — Z9981 Dependence on supplemental oxygen: Secondary | ICD-10-CM | POA: Diagnosis not present

## 2019-12-24 DIAGNOSIS — K920 Hematemesis: Secondary | ICD-10-CM | POA: Diagnosis not present

## 2019-12-24 DIAGNOSIS — M6281 Muscle weakness (generalized): Secondary | ICD-10-CM | POA: Diagnosis not present

## 2019-12-24 DIAGNOSIS — R29898 Other symptoms and signs involving the musculoskeletal system: Secondary | ICD-10-CM | POA: Diagnosis not present

## 2019-12-24 DIAGNOSIS — M5489 Other dorsalgia: Secondary | ICD-10-CM | POA: Diagnosis not present

## 2019-12-24 DIAGNOSIS — J189 Pneumonia, unspecified organism: Secondary | ICD-10-CM | POA: Diagnosis not present

## 2019-12-24 DIAGNOSIS — M25531 Pain in right wrist: Secondary | ICD-10-CM | POA: Diagnosis not present

## 2019-12-24 DIAGNOSIS — I10 Essential (primary) hypertension: Secondary | ICD-10-CM | POA: Diagnosis not present

## 2019-12-24 LAB — COMPREHENSIVE METABOLIC PANEL
ALT: 13 U/L (ref 0–44)
AST: 15 U/L (ref 15–41)
Albumin: 2.8 g/dL — ABNORMAL LOW (ref 3.5–5.0)
Alkaline Phosphatase: 67 U/L (ref 38–126)
Anion gap: 11 (ref 5–15)
BUN: 25 mg/dL — ABNORMAL HIGH (ref 8–23)
CO2: 27 mmol/L (ref 22–32)
Calcium: 9.1 mg/dL (ref 8.9–10.3)
Chloride: 102 mmol/L (ref 98–111)
Creatinine, Ser: 1.29 mg/dL — ABNORMAL HIGH (ref 0.61–1.24)
GFR calc Af Amer: >60 mL/min (ref >60)
GFR calc non Af Amer: 53 mL/min — ABNORMAL LOW (ref >60)
Glucose, Bld: 116 mg/dL — ABNORMAL HIGH (ref 70–99)
Potassium: 3.6 mmol/L (ref 3.5–5.1)
Sodium: 140 mmol/L (ref 135–145)
Total Bilirubin: 0.6 mg/dL (ref 0.3–1.2)
Total Protein: 6.5 g/dL (ref 6.5–8.1)

## 2019-12-24 LAB — CBC
HCT: 31.5 % — ABNORMAL LOW (ref 39.0–52.0)
Hemoglobin: 9.8 g/dL — ABNORMAL LOW (ref 13.0–17.0)
MCH: 25.8 pg — ABNORMAL LOW (ref 26.0–34.0)
MCHC: 31.1 g/dL (ref 30.0–36.0)
MCV: 82.9 fL (ref 80.0–100.0)
Platelets: 527 10*3/uL — ABNORMAL HIGH (ref 150–400)
RBC: 3.8 MIL/uL — ABNORMAL LOW (ref 4.22–5.81)
RDW: 14.9 % (ref 11.5–15.5)
WBC: 10.6 10*3/uL — ABNORMAL HIGH (ref 4.0–10.5)
nRBC: 0 % (ref 0.0–0.2)

## 2019-12-24 LAB — LEGIONELLA PNEUMOPHILA SEROGP 1 UR AG: L. pneumophila Serogp 1 Ur Ag: NEGATIVE

## 2019-12-24 LAB — GLUCOSE, CAPILLARY
Glucose-Capillary: 100 mg/dL — ABNORMAL HIGH (ref 70–99)
Glucose-Capillary: 102 mg/dL — ABNORMAL HIGH (ref 70–99)
Glucose-Capillary: 105 mg/dL — ABNORMAL HIGH (ref 70–99)

## 2019-12-24 LAB — TSH: TSH: 2.378 u[IU]/mL (ref 0.350–4.500)

## 2019-12-24 MED ORDER — AMOXICILLIN-POT CLAVULANATE 875-125 MG PO TABS: 1.0000 | ORAL_TABLET | Freq: Two times a day (BID) | ORAL | 0 refills | Status: AC

## 2019-12-24 MED ORDER — TRAMADOL HCL 50 MG PO TABS
50.0000 mg | ORAL_TABLET | Freq: Three times a day (TID) | ORAL | 0 refills | Status: DC | PRN
Start: 2019-12-24 — End: 2020-07-11

## 2019-12-24 NOTE — Discharge Summary (Addendum)
Physician Discharge Summary  Brian Silva FIE:[id number] DOB: 03/01/1941 DOA: 12/22/2019  PCP: Benito Mccreedy, MD  Admit date: 12/22/2019 Discharge date: 12/24/2019  Time spent: 50 minutes  Recommendations for Outpatient Follow-up:  1. Continue Augmentin 1 tablet p.o. twice daily for 5 more days. 2. Follow blood culture results as outpatient  Discharge Diagnoses:  Active Problems:   CAP (community acquired pneumonia)   HCAP (healthcare-associated pneumonia)   Discharge Condition: Stable  Diet recommendation: Carb modified diet  Filed Weights   12/23/19 0249  Weight: (!) 150.5 kg    History of present illness:  79 year old male with history of hypertension, grade 1 diastolic dysfunction, chronic venous stasis, hyperlipidemia, asthma, morbid obesity, diabetes type 2, OSA on CPAP, chronic respiratory failure on 2 L of oxygen, history of syncopal episodes, falls, was sent from skilled nursing facility due to nausea vomiting abdominal pain for 3 days.  There was concern for bowel obstruction due to abnormal imaging.  Patient was sent to the ED for persistent nausea vomiting and diarrhea.  In the ED chest x-ray showed bilateral lower lobe infiltrates and no abdominal findings.  Patient admitted with a diagnosis of community-acquired pneumonia.   Hospital Course:   1. Nausea/vomiting/abdominal pain-resolved, abdominal CT scan was negative for bowel obstruction. 2. Community-acquired pneumonia-seen on CT abdomen/pelvis started on ceftriaxone and Zithromax.  WBC is down to 10,000.  Strep pneumo urinary antigen is negative.    Blood cultures negative to date.  Follow blood culture results.  Will discharge on Augmentin 1 tablet p.o. twice daily for 5 more days. 3. Hypokalemia-replete 4. CKD stage III-creatinine 1.29, back to baseline.  Lasix has been restarted. 5. Chronic hypoxic respiratory failure-patient chronically on 2 L/min of oxygen.  No new requirements. 6. L1 subacute/acute  compression fracture-PT OT consulted, pain control. 7. History of asthma-no acute exacerbation.  Continue as needed nebs. 8. OSA-continue CPAP nightly 9. Diabetes mellitus type 2-continue Metformin. 10. Hypertension-blood pressure is stable, continue amlodipine, lisinopril 11. Normocytic anemia-hemoglobin has improved to 9.8 today.   12. History of TIA-continue aspirin 13. Chronic venous stasis-continue Lasix 80 mg p.o. twice daily 14. History of  diastolic dysfunction-euvolemic.  Continue Lasix as above.  Procedures:    Consultations:    Discharge Exam: Vitals:   12/23/19 2256 12/24/19 0743  BP: (!) 156/67 136/64  Pulse: 88 72  Resp: 19 20  Temp: 98.5 F (36.9 C) 97.8 F (36.6 C)  SpO2: 98% 99%    Discharge Instructions   Discharge Instructions    Diet - low sodium heart healthy   Complete by: As directed    Increase activity slowly   Complete by: As directed      Allergies as of 12/24/2019   No Known Allergies     Medication List    TAKE these medications   acetaminophen 500 MG tablet Commonly known as: TYLENOL Take 2 tablets (1,000 mg total) by mouth every 8 (eight) hours.   albuterol 108 (90 Base) MCG/ACT inhaler Commonly known as: VENTOLIN HFA Inhale 2 puffs into the lungs every 6 (six) hours as needed. For shortness of breath   amLODipine 10 MG tablet Commonly known as: NORVASC Take 10 mg by mouth daily.   amoxicillin-clavulanate 875-125 MG tablet Commonly known as: Augmentin Take 1 tablet by mouth 2 (two) times daily for 5 days.   aspirin EC 81 MG tablet Take 81 mg by mouth daily.   atorvastatin 20 MG tablet Commonly known as: LIPITOR Take 20 mg by mouth at bedtime.  DERMACLOUD EX Apply 1 application topically in the morning and at bedtime. buttocks   docusate sodium 100 MG capsule Commonly known as: COLACE Take 100 mg by mouth 2 (two) times daily.   DULoxetine 30 MG capsule Commonly known as: CYMBALTA Take 30 mg by mouth daily.    fluticasone 50 MCG/ACT nasal spray Commonly known as: FLONASE Place 1 spray into both nostrils daily.   furosemide 80 MG tablet Commonly known as: LASIX Take 80 mg by mouth 2 (two) times daily.   lisinopril 5 MG tablet Commonly known as: ZESTRIL Take 1 tablet (5 mg total) by mouth daily.   metFORMIN 500 MG tablet Commonly known as: GLUCOPHAGE Take 500 mg by mouth 2 (two) times daily with a meal.   midodrine 5 MG tablet Commonly known as: PROAMATINE Take 1 tablet (5 mg total) by mouth 3 (three) times daily with meals.   nystatin powder Generic drug: nystatin Apply 1 application topically in the morning and at bedtime. Abdominal folds   ondansetron 4 MG tablet Commonly known as: ZOFRAN Take 4 mg by mouth every 8 (eight) hours as needed for nausea or vomiting.   polyethylene glycol 17 g packet Commonly known as: MIRALAX / GLYCOLAX Take 17 g by mouth daily as needed for mild constipation.   pregabalin 50 MG capsule Commonly known as: LYRICA Take 50 mg by mouth 3 (three) times daily.   traMADol 50 MG tablet Commonly known as: ULTRAM Take 1 tablet (50 mg total) by mouth every 8 (eight) hours as needed for moderate pain.   Vitamin D3 125 MCG (5000 UT) Tbdp Take 5,000 Units by mouth daily.      No Known Allergies    The results of significant diagnostics from this hospitalization (including imaging, microbiology, ancillary and laboratory) are listed below for reference.    Significant Diagnostic Studies: CT ABDOMEN PELVIS W CONTRAST  Result Date: 12/22/2019 CLINICAL DATA:  Acute nonlocalized abdominal pain EXAM: CT ABDOMEN AND PELVIS WITH CONTRAST TECHNIQUE: Multidetector CT imaging of the abdomen and pelvis was performed using the standard protocol following bolus administration of intravenous contrast. CONTRAST:  54mL OMNIPAQUE IOHEXOL 300 MG/ML  SOLN COMPARISON:  CT 10/06/2017 FINDINGS: Lower chest: Patchy consolidative and tree-in-bud nodularity seen in the lung  bases. More bandlike opacities in both lower lobes likely reflecting subsegmental atelectatic change and/or scarring. Some abundant subpleural fat is noted as well. Cardiac size. No pericardial effusion. Calcifications of the coronary arteries and aortic leaflets. Hepatobiliary: No worrisome focal liver lesions. Smooth liver surface contour. Normal hepatic attenuation. Prominent fold at the gallbladder body. No visible calcified gallstones within the gallbladder or biliary tree. No biliary ductal dilatation. Pancreas: Unremarkable. No pancreatic ductal dilatation or surrounding inflammatory changes. Spleen: Normal splenic size. No concerning splenic lesions. Scattered punctate calcifications throughout the spleen likely reflecting sequela of remote granulomatous disease. Adrenals/Urinary Tract: Normal adrenal glands. Stable mild bilateral symmetric perinephric stranding, a nonspecific finding though may correlate with either age or decreased renal function. Kidneys enhance uniformly and excrete symmetrically. No visible concerning renal lesion. No urolithiasis or frank hydronephrosis. There is some mild circumferential bladder wall thickening. Some of which may be related to underdistention though faint perivesicular hazy stranding is present and could suggest an underlying cystitis. Stomach/Bowel: Distal esophagus, stomach and duodenum are unremarkable. No small bowel thickening or dilatation. A normal appendix is visualized. No colonic dilatation or wall thickening. No evidence of bowel obstruction. Vascular/Lymphatic: Atherosclerotic calcifications within the abdominal aorta and branch vessels. No aneurysm or ectasia.  No enlarged abdominopelvic lymph nodes. Reproductive: The prostate and seminal vesicles are unremarkable. Other: Stable appearance of a lobular fat containing ventral hernia without evidence of strangulation of the herniated fat. No bowel containing hernias. No abdominopelvic free air or fluid.  Musculoskeletal: Multilevel degenerative changes are present in the imaged portions of the spine. Likely acute to subacute appearing anterior wedging compression deformity of the L1 vertebral body with approximately 40% height loss anteriorly and some mild surrounding thickening. Vacuum phenomenon noted in the adjacent disc. 2 mm retrolisthesis of L4 on 5 and 3 mm of grade 1 anterolisthesis L5 on S1 with associated bilateral L5 pars defects, similar to comparison. No other acute or suspicious osseous abnormalities. IMPRESSION: 1. Patchy consolidative and tree-in-bud nodularity in the lung bases, concerning for acute infection or inflammation. 2. Likely acute to subacute appearing anterior wedging compression deformity of the L1 vertebral body with approximately 40% height loss anteriorly and some mild surrounding thickening. Correlate for point tenderness. 3. Mild circumferential bladder wall thickening with faint perivesicular hazy stranding, suggestive of an underlying cystitis, consider correlation with urinalysis. Stable mild nonspecific perinephric stranding could be related to diminished renal function or advanced age though ascending infection is not excluded. 4. Stable appearance of a lobular fat containing ventral hernia without evidence of strangulation of the herniated fat. 5. Aortic Atherosclerosis (ICD10-I70.0). Electronically Signed   By: Lovena Le M.D.   On: 12/22/2019 16:21   DG Chest Portable 1 View  Result Date: 12/22/2019 CLINICAL DATA:  Leukocytosis EXAM: PORTABLE CHEST 1 VIEW COMPARISON:  Chest radiograph dated 09/10/2019. FINDINGS: The heart size is normal. Vascular calcifications are seen in the aortic arch. Mild right basilar atelectasis/airspace disease is noted. The left lung is clear. There is no pleural effusion or pneumothorax. The visualized skeletal structures are unremarkable. IMPRESSION: Mild right basilar atelectasis/airspace disease. Electronically Signed   By: Zerita Boers  M.D.   On: 12/22/2019 17:37    Microbiology: Recent Results (from the past 240 hour(s))  Blood culture (routine x 2)     Status: None (Preliminary result)   Collection Time: 12/22/19  9:12 PM   Specimen: BLOOD  Result Value Ref Range Status   Specimen Description BLOOD SITE NOT SPECIFIED  Final   Special Requests   Final    BOTTLES DRAWN AEROBIC AND ANAEROBIC Blood Culture adequate volume   Culture   Final    NO GROWTH 2 DAYS Performed at [facility], [address], [place],  [address]    Report Status PENDING  Incomplete  SARS Coronavirus 2 by RT PCR (hospital order, performed in [facility] hospital lab) Nasopharyngeal Nasopharyngeal Swab     Status: None   Collection Time: 12/22/19  9:12 PM   Specimen: Nasopharyngeal Swab  Result Value Ref Range Status   SARS Coronavirus 2 NEGATIVE NEGATIVE Final    Comment: (NOTE) SARS-CoV-2 target nucleic acids are NOT DETECTED.  The SARS-CoV-2 RNA is generally detectable in upper and lower respiratory specimens during the acute phase of infection. The lowest concentration of SARS-CoV-2 viral copies this assay can detect is 250 copies / mL. A negative result does not preclude SARS-CoV-2 infection and should not be used as the sole basis for treatment or other patient management decisions.  A negative result may occur with improper specimen collection / handling, submission of specimen other than nasopharyngeal swab, presence of viral mutation(s) within the areas targeted by this assay, and inadequate number of viral copies (<250 copies / mL). A negative  result must be combined with clinical observations, patient history, and epidemiological information.  Fact Sheet for Patients:   StrictlyIdeas.no  Fact Sheet for Healthcare Providers: BankingDealers.co.za  This test is not yet approved or  cleared by the [place] FDA and has been authorized for detection and/or  diagnosis of SARS-CoV-2 by FDA under an Emergency Use Authorization (EUA).  This EUA will remain in effect (meaning this test can be used) for the duration of the COVID-19 declaration under Section 564(b)(1) of the Act, 21 U.S.C. section 360bbb-3(b)(1), unless the authorization is terminated or revoked sooner.  Performed at [facility], [place] [address] [place], [place] [address]   Blood culture (routine x 2)     Status: None (Preliminary result)   Collection Time: 12/22/19  9:50 PM   Specimen: BLOOD  Result Value Ref Range Status   Specimen Description BLOOD SITE NOT SPECIFIED  Final   Special Requests   Final    BOTTLES DRAWN AEROBIC ONLY Blood Culture results may not be optimal due to an inadequate volume of blood received in culture bottles   Culture   Final    NO GROWTH 2 DAYS Performed at [facility], [place] [address] [place], [place] [address]    Report Status PENDING  Incomplete  Urine culture     Status: Abnormal   Collection Time: 12/22/19 10:41 PM   Specimen: Urine, Random  Result Value Ref Range Status   Specimen Description URINE, RANDOM  Final   Special Requests   Final    NONE Performed at [facility], [place] [address], [place], [place] [address]    Culture MULTIPLE SPECIES PRESENT, SUGGEST RECOLLECTION (A)  Final   Report Status 12/23/2019 FINAL  Final  MRSA PCR Screening     Status: None   Collection Time: 12/23/19  6:20 AM   Specimen: Nasopharyngeal  Result Value Ref Range Status   MRSA by PCR NEGATIVE NEGATIVE Final    Comment:        The GeneXpert MRSA Assay (FDA approved for NASAL specimens only), is one component of a comprehensive MRSA colonization surveillance program. It is not intended to diagnose MRSA infection nor to guide or monitor treatment for MRSA infections. Performed at [facility], [place] [address] [place], [place] [address]   Resp Panel by RT PCR (RSV, Flu A&B, Covid) -     Status: None   Collection Time:  12/23/19  6:20 AM  Result Value Ref Range Status   SARS Coronavirus 2 by RT PCR NEGATIVE NEGATIVE Final    Comment: (NOTE) SARS-CoV-2 target nucleic acids are NOT DETECTED.  The SARS-CoV-2 RNA is generally detectable in upper respiratoy specimens during the acute phase of infection. The lowest concentration of SARS-CoV-2 viral copies this assay can detect is 131 copies/mL. A negative result does not preclude SARS-Cov-2 infection and should not be used as the sole basis for treatment or other patient management decisions. A negative result may occur with  improper specimen collection/handling, submission of specimen other than nasopharyngeal swab, presence of viral mutation(s) within the areas targeted by this assay, and inadequate number of viral copies (<131 copies/mL). A negative result must be combined with clinical observations, patient history, and epidemiological information. The expected result is Negative.  Fact Sheet for Patients:  PinkCheek.be  Fact Sheet for Healthcare Providers:  GravelBags.it  This test is no t yet approved or cleared by the [place] and  has been authorized for  detection and/or diagnosis of SARS-CoV-2 by FDA under an Emergency Use Authorization (EUA). This EUA will remain  in effect (meaning this test can be used) for the duration of the COVID-19 declaration under Section 564(b)(1) of the Act, 21 U.S.C. section 360bbb-3(b)(1), unless the authorization is terminated or revoked sooner.     Influenza A by PCR NEGATIVE NEGATIVE Final   Influenza B by PCR NEGATIVE NEGATIVE Final    Comment: (NOTE) The Xpert Xpress SARS-CoV-2/FLU/RSV assay is intended as an aid in  the diagnosis of influenza from Nasopharyngeal swab specimens and  should not be used as a sole basis for treatment. Nasal washings and  aspirates are unacceptable for Xpert Xpress SARS-CoV-2/FLU/RSV  testing.  Fact Sheet  for Patients: PinkCheek.be  Fact Sheet for Healthcare Providers: GravelBags.it  This test is not yet approved or cleared by the [place] FDA and  has been authorized for detection and/or diagnosis of SARS-CoV-2 by  FDA under an Emergency Use Authorization (EUA). This EUA will remain  in effect (meaning this test can be used) for the duration of the  Covid-19 declaration under Section 564(b)(1) of the Act, 21  U.S.C. section 360bbb-3(b)(1), unless the authorization is  terminated or revoked.    Respiratory Syncytial Virus by PCR NEGATIVE NEGATIVE Final    Comment: (NOTE) Fact Sheet for Patients: PinkCheek.be  Fact Sheet for Healthcare Providers: GravelBags.it  This test is not yet approved or cleared by the [place] FDA and  has been authorized for detection and/or diagnosis of SARS-CoV-2 by  FDA under an Emergency Use Authorization (EUA). This EUA will remain  in effect (meaning this test can be used) for the duration of the  COVID-19 declaration under Section 564(b)(1) of the Act, 21 U.S.C.  section 360bbb-3(b)(1), unless the authorization is terminated or  revoked. Performed at [facility], [place] [address], [place], [place] [address]      Labs: Basic Metabolic Panel: Recent Labs  Lab 12/22/19 1444 12/23/19 0511 12/24/19 1326  NA 138 138 140  K 3.6 3.1* 3.6  CL 95* 96* 102  CO2 31 27 27   GLUCOSE 127* 101* 116*  BUN 49* 41* 25*  CREATININE 1.55* 1.40* 1.29*  CALCIUM 9.5 9.3 9.1   Liver Function Tests: Recent Labs  Lab 12/22/19 1444 12/23/19 0511 12/24/19 1326  AST 18 15 15   ALT 14 13 13   ALKPHOS 62 62 67  BILITOT 0.5 0.6 0.6  PROT 7.1 6.7 6.5  ALBUMIN 3.1* 2.9* 2.8*   Recent Labs  Lab 12/22/19 1444  LIPASE 35   No results for input(s): AMMONIA in the last 168 hours. CBC: Recent Labs  Lab 12/22/19 1444 12/23/19 0511  12/24/19 1326  WBC 22.2* 12.0* 10.6*  NEUTROABS 18.3* 8.4*  --   HGB 8.4* 9.8* 9.8*  HCT 26.8* 31.4* 31.5*  MCV 84.3 83.1 82.9  PLT 665* 506* 527*   Cardiac Enzymes: No results for input(s): CKTOTAL, CKMB, CKMBINDEX, TROPONINI in the last 168 hours. BNP: BNP (last 3 results) No results for input(s): BNP in the last 8760 hours.  ProBNP (last 3 results) No results for input(s): PROBNP in the last 8760 hours.  CBG: Recent Labs  Lab 12/23/19 1132 12/23/19 1609 12/23/19 2115 12/24/19 0745 12/24/19 1209  GLUCAP 102* 104* 108* 100* 102*       Signed:  Oswald Hillock MD.  [facility] 12/24/2019, 3:18 PM

## 2019-12-24 NOTE — Progress Notes (Signed)
2nd attempt to call report to [facility]. Left voicemail for call to be returned to [place].

## 2019-12-24 NOTE — Progress Notes (Signed)
[facility]  PROGRESS NOTE  Brian Silva TKZ:[id number] DOB: [birth year] DOA: 12/22/2019 PCP: Benito Mccreedy, MD   Brief HPI:   79 year old male with history of hypertension, grade 1 diastolic dysfunction, chronic venous stasis, hyperlipidemia, asthma, morbid obesity, diabetes type 2, OSA on CPAP, chronic respiratory failure on 2 L of oxygen, history of syncopal episodes, falls, was sent from skilled nursing facility due to nausea vomiting abdominal pain for 3 days.  There was concern for bowel obstruction due to abnormal imaging.  Patient was sent to the ED for persistent nausea vomiting and diarrhea.  In the ED chest x-ray showed bilateral lower lobe infiltrates and no abdominal findings.  Patient admitted with a diagnosis of community-acquired pneumonia.    Subjective   Patient seen and examined, denies any complaints.  Refused labs this morning.   Assessment/Plan:     1. Nausea/vomiting/abdominal pain-resolved, abdominal CT scan was negative for bowel obstruction. 2. Community-acquired pneumonia-seen on CT abdomen/pelvis started on ceftriaxone and Zithromax.  WBC is down to 12,000.  Strep pneumo urinary antigen is negative.  Follow blood culture results. 3. Hypokalemia-potassium was 3.1 yesterday, potassium was replaced.  Patient refused lab this morning.  Have convinced patient to get labs today. 4. Chronic hypoxic respiratory failure-patient chronically on 2 L/min of oxygen.  No new requirements. 5. L1 subacute/acute compression fracture-PT OT consulted, pain control. 6. History of asthma-no acute exacerbation.  Continue as needed nebs. 7. OSA-continue CPAP nightly 8. Diabetes mellitus type 2-continue sliding scale insulin NovoLog.  CBG well controlled. 9. Hypertension-blood pressure is stable, continue body pain. 10. Normocytic anemia-hemoglobin has improved to 9.8 today.  Continue to monitor H&H. 11. History of TIA-continue aspirin 12. Chronic venous stasis-Lasix on  hold due to AKI.  Follow BMP in am. 13. History of  diastolic dysfunction-euvolemic.     COVID-19 Labs  Recent Labs    12/23/19 0511  FERRITIN 158    Lab Results  Component Value Date   SARSCOV2NAA NEGATIVE 12/23/2019   SARSCOV2NAA NEGATIVE 12/22/2019   [place] NEGATIVE 09/14/2019   [place] NEGATIVE 09/10/2019     Scheduled medications:   . amLODipine  10 mg Oral Daily  . aspirin EC  81 mg Oral Daily  . atorvastatin  20 mg Oral QHS  . cholecalciferol  5,000 Units Oral Daily  . DULoxetine  30 mg Oral Daily  . fluticasone  1 spray Each Nare Daily  . insulin aspart  0-9 Units Subcutaneous TID WC  . insulin aspart  3 Units Subcutaneous TID WC  . nystatin  1 application Topical BID  . pregabalin  50 mg Oral TID         CBG: Recent Labs  Lab 12/23/19 1132 12/23/19 1609 12/23/19 2115 12/24/19 0745 12/24/19 1209  GLUCAP 102* 104* 108* 100* 102*    SpO2: 99 %    CBC: Recent Labs  Lab 12/22/19 1444 12/23/19 0511  WBC 22.2* 12.0*  NEUTROABS 18.3* 8.4*  HGB 8.4* 9.8*  HCT 26.8* 31.4*  MCV 84.3 83.1  PLT 665* 506*    Basic Metabolic Panel: Recent Labs  Lab 12/22/19 1444 12/23/19 0511  NA 138 138  K 3.6 3.1*  CL 95* 96*  CO2 31 27  GLUCOSE 127* 101*  BUN 49* 41*  CREATININE 1.55* 1.40*  CALCIUM 9.5 9.3     Liver Function Tests: Recent Labs  Lab 12/22/19 1444 12/23/19 0511  AST 18 15  ALT 14 13  ALKPHOS 62 62  BILITOT 0.5 0.6  PROT 7.1  6.7  ALBUMIN 3.1* 2.9*     Antibiotics: Anti-infectives (From admission, onward)   Start     Dose/Rate Route Frequency Ordered Stop   12/22/19 2200  cefTRIAXone (ROCEPHIN) 2 g in sodium chloride 0.9 % 100 mL IVPB        2 g 200 mL/hr over 30 Minutes Intravenous Every 24 hours 12/22/19 2129 12/27/19 2159   12/22/19 2200  azithromycin (ZITHROMAX) 500 mg in sodium chloride 0.9 % 250 mL IVPB        500 mg 250 mL/hr over 60 Minutes Intravenous Every 24 hours 12/22/19 2129 12/27/19 2159    12/22/19 2030  cefTRIAXone (ROCEPHIN) 1 g in sodium chloride 0.9 % 100 mL IVPB  Status:  Discontinued        1 g 200 mL/hr over 30 Minutes Intravenous  Once 12/22/19 2021 12/22/19 2234   12/22/19 2030  azithromycin (ZITHROMAX) 500 mg in sodium chloride 0.9 % 250 mL IVPB  Status:  Discontinued        500 mg 250 mL/hr over 60 Minutes Intravenous  Once 12/22/19 2021 12/22/19 2132       DVT prophylaxis: SCDs  Code Status: Full code  Family Communication: No family at bedside    Status is: Inpatient  Dispo: The patient is from: Skilled nursing facility              Anticipated d/c is to: Skilled nursing facility              Anticipated d/c date is: 12/24/2019              Patient currently not medically stable for discharge  Barrier to discharge-IV antibiotics for pneumonia, potassium being replaced     Consultants:    Procedures:     Objective   Vitals:   12/23/19 0201 12/23/19 0249 12/23/19 2256 12/24/19 0743  BP: 115/81 (!) 153/57 (!) 156/67 136/64  Pulse: 78 81 88 72  Resp: (!) 25 20 19 20   Temp: 98.2 F (36.8 C) 98.8 F (37.1 C) 98.5 F (36.9 C) 97.8 F (36.6 C)  TempSrc: Oral Oral Oral   SpO2: 97% 100% 98% 99%  Weight:  (!) 150.5 kg    Height:  6' (1.829 m)      Intake/Output Summary (Last 24 hours) at 12/24/2019 1311 Last data filed at 12/24/2019 1237 Gross per 24 hour  Intake 1935 ml  Output 1700 ml  Net 235 ml    08/31 1901 - 09/02 0700 In: 2637 [P.O.:680; I.V.:1585] Out: 1750 [Urine:1750]  Filed Weights   12/23/19 0249  Weight: (!) 150.5 kg    Physical Examination:   General-appears in no acute distress  Heart-S1-S2, regular, no murmur auscultated  Lungs-clear to auscultation bilaterally, no wheezing or crackles auscultated  Abdomen-soft, nontender, no organomegaly  Extremities-no edema in the lower extremities  Neuro-alert, oriented x3, no focal deficit noted    Data Reviewed:   Recent Results (from the past 240 hour(s))   Blood culture (routine x 2)     Status: None (Preliminary result)   Collection Time: 12/22/19  9:12 PM   Specimen: BLOOD  Result Value Ref Range Status   Specimen Description BLOOD SITE NOT SPECIFIED  Final   Special Requests   Final    BOTTLES DRAWN AEROBIC AND ANAEROBIC Blood Culture adequate volume   Culture   Final    NO GROWTH 2 DAYS Performed at [facility], [address], [place], [place] [address]  Report Status PENDING  Incomplete  SARS Coronavirus 2 by RT PCR (hospital order, performed in [facility] hospital lab) Nasopharyngeal Nasopharyngeal Swab     Status: None   Collection Time: 12/22/19  9:12 PM   Specimen: Nasopharyngeal Swab  Result Value Ref Range Status   SARS Coronavirus 2 NEGATIVE NEGATIVE Final    Comment: (NOTE) SARS-CoV-2 target nucleic acids are NOT DETECTED.  The SARS-CoV-2 RNA is generally detectable in upper and lower respiratory specimens during the acute phase of infection. The lowest concentration of SARS-CoV-2 viral copies this assay can detect is 250 copies / mL. A negative result does not preclude SARS-CoV-2 infection and should not be used as the sole basis for treatment or other patient management decisions.  A negative result may occur with improper specimen collection / handling, submission of specimen other than nasopharyngeal swab, presence of viral mutation(s) within the areas targeted by this assay, and inadequate number of viral copies (<250 copies / mL). A negative result must be combined with clinical observations, patient history, and epidemiological information.  Fact Sheet for Patients:   StrictlyIdeas.no  Fact Sheet for Healthcare Providers: BankingDealers.co.za  This test is not yet approved or  cleared by the [place] FDA and has been authorized for detection and/or diagnosis of SARS-CoV-2 by FDA under an Emergency Use Authorization (EUA).  This EUA will  remain in effect (meaning this test can be used) for the duration of the COVID-19 declaration under Section 564(b)(1) of the Act, 21 U.S.C. section 360bbb-3(b)(1), unless the authorization is terminated or revoked sooner.  Performed at [facility], [place] [address], [place], [place] [address]   Blood culture (routine x 2)     Status: None (Preliminary result)   Collection Time: 12/22/19  9:50 PM   Specimen: BLOOD  Result Value Ref Range Status   Specimen Description BLOOD SITE NOT SPECIFIED  Final   Special Requests   Final    BOTTLES DRAWN AEROBIC ONLY Blood Culture results may not be optimal due to an inadequate volume of blood received in culture bottles   Culture   Final    NO GROWTH 2 DAYS Performed at [facility], [place] [address], [place], [place] [address]    Report Status PENDING  Incomplete  Urine culture     Status: Abnormal   Collection Time: 12/22/19 10:41 PM   Specimen: Urine, Random  Result Value Ref Range Status   Specimen Description URINE, RANDOM  Final   Special Requests   Final    NONE Performed at [facility], [place] [address], [place], [place] [address]    Culture MULTIPLE SPECIES PRESENT, SUGGEST RECOLLECTION (A)  Final   Report Status 12/23/2019 FINAL  Final  MRSA PCR Screening     Status: None   Collection Time: 12/23/19  6:20 AM   Specimen: Nasopharyngeal  Result Value Ref Range Status   MRSA by PCR NEGATIVE NEGATIVE Final    Comment:        The GeneXpert MRSA Assay (FDA approved for NASAL specimens only), is one component of a comprehensive MRSA colonization surveillance program. It is not intended to diagnose MRSA infection nor to guide or monitor treatment for MRSA infections. Performed at [facility], [place] [address] [place],  [address]   Resp Panel by RT PCR (RSV, Flu A&B, Covid) -     Status: None   Collection Time: 12/23/19  6:20 AM  Result Value Ref Range Status  SARS Coronavirus 2 by RT PCR NEGATIVE  NEGATIVE Final    Comment: (NOTE) SARS-CoV-2 target nucleic acids are NOT DETECTED.  The SARS-CoV-2 RNA is generally detectable in upper respiratoy specimens during the acute phase of infection. The lowest concentration of SARS-CoV-2 viral copies this assay can detect is 131 copies/mL. A negative result does not preclude SARS-Cov-2 infection and should not be used as the sole basis for treatment or other patient management decisions. A negative result may occur with  improper specimen collection/handling, submission of specimen other than nasopharyngeal swab, presence of viral mutation(s) within the areas targeted by this assay, and inadequate number of viral copies (<131 copies/mL). A negative result must be combined with clinical observations, patient history, and epidemiological information. The expected result is Negative.  Fact Sheet for Patients:  PinkCheek.be  Fact Sheet for Healthcare Providers:  GravelBags.it  This test is no t yet approved or cleared by the [place] FDA and  has been authorized for detection and/or diagnosis of SARS-CoV-2 by FDA under an Emergency Use Authorization (EUA). This EUA will remain  in effect (meaning this test can be used) for the duration of the COVID-19 declaration under Section 564(b)(1) of the Act, 21 U.S.C. section 360bbb-3(b)(1), unless the authorization is terminated or revoked sooner.     Influenza A by PCR NEGATIVE NEGATIVE Final   Influenza B by PCR NEGATIVE NEGATIVE Final    Comment: (NOTE) The Xpert Xpress SARS-CoV-2/FLU/RSV assay is intended as an aid in  the diagnosis of influenza from Nasopharyngeal swab specimens and  should not be used as a sole basis for treatment. Nasal washings and  aspirates are unacceptable for Xpert Xpress SARS-CoV-2/FLU/RSV  testing.  Fact Sheet for Patients: PinkCheek.be  Fact Sheet for Healthcare  Providers: GravelBags.it  This test is not yet approved or cleared by the [place] FDA and  has been authorized for detection and/or diagnosis of SARS-CoV-2 by  FDA under an Emergency Use Authorization (EUA). This EUA will remain  in effect (meaning this test can be used) for the duration of the  Covid-19 declaration under Section 564(b)(1) of the Act, 21  U.S.C. section 360bbb-3(b)(1), unless the authorization is  terminated or revoked.    Respiratory Syncytial Virus by PCR NEGATIVE NEGATIVE Final    Comment: (NOTE) Fact Sheet for Patients: PinkCheek.be  Fact Sheet for Healthcare Providers: GravelBags.it  This test is not yet approved or cleared by the [place] FDA and  has been authorized for detection and/or diagnosis of SARS-CoV-2 by  FDA under an Emergency Use Authorization (EUA). This EUA will remain  in effect (meaning this test can be used) for the duration of the  COVID-19 declaration under Section 564(b)(1) of the Act, 21 U.S.C.  section 360bbb-3(b)(1), unless the authorization is terminated or  revoked. Performed at [facility], [place] [address] [place], [place] [address]     Recent Labs  Lab 12/22/19 1444  LIPASE 35   No results for input(s): AMMONIA in the last 168 hours.  Cardiac Enzymes: No results for input(s): CKTOTAL, CKMB, CKMBINDEX, TROPONINI in the last 168 hours. BNP (last 3 results) No results for input(s): BNP in the last 8760 hours.  ProBNP (last 3 results) No results for input(s): PROBNP in the last 8760 hours.  Studies:  CT ABDOMEN PELVIS W CONTRAST  Result Date: 12/22/2019 CLINICAL DATA:  Acute nonlocalized abdominal pain EXAM: CT ABDOMEN AND PELVIS WITH CONTRAST TECHNIQUE: Multidetector CT imaging of the abdomen and pelvis was performed using  the standard protocol following bolus administration of intravenous contrast. CONTRAST:  4mL  OMNIPAQUE IOHEXOL 300 MG/ML  SOLN COMPARISON:  CT 10/06/2017 FINDINGS: Lower chest: Patchy consolidative and tree-in-bud nodularity seen in the lung bases. More bandlike opacities in both lower lobes likely reflecting subsegmental atelectatic change and/or scarring. Some abundant subpleural fat is noted as well. Cardiac size. No pericardial effusion. Calcifications of the coronary arteries and aortic leaflets. Hepatobiliary: No worrisome focal liver lesions. Smooth liver surface contour. Normal hepatic attenuation. Prominent fold at the gallbladder body. No visible calcified gallstones within the gallbladder or biliary tree. No biliary ductal dilatation. Pancreas: Unremarkable. No pancreatic ductal dilatation or surrounding inflammatory changes. Spleen: Normal splenic size. No concerning splenic lesions. Scattered punctate calcifications throughout the spleen likely reflecting sequela of remote granulomatous disease. Adrenals/Urinary Tract: Normal adrenal glands. Stable mild bilateral symmetric perinephric stranding, a nonspecific finding though may correlate with either age or decreased renal function. Kidneys enhance uniformly and excrete symmetrically. No visible concerning renal lesion. No urolithiasis or frank hydronephrosis. There is some mild circumferential bladder wall thickening. Some of which may be related to underdistention though faint perivesicular hazy stranding is present and could suggest an underlying cystitis. Stomach/Bowel: Distal esophagus, stomach and duodenum are unremarkable. No small bowel thickening or dilatation. A normal appendix is visualized. No colonic dilatation or wall thickening. No evidence of bowel obstruction. Vascular/Lymphatic: Atherosclerotic calcifications within the abdominal aorta and branch vessels. No aneurysm or ectasia. No enlarged abdominopelvic lymph nodes. Reproductive: The prostate and seminal vesicles are unremarkable. Other: Stable appearance of a lobular fat  containing ventral hernia without evidence of strangulation of the herniated fat. No bowel containing hernias. No abdominopelvic free air or fluid. Musculoskeletal: Multilevel degenerative changes are present in the imaged portions of the spine. Likely acute to subacute appearing anterior wedging compression deformity of the L1 vertebral body with approximately 40% height loss anteriorly and some mild surrounding thickening. Vacuum phenomenon noted in the adjacent disc. 2 mm retrolisthesis of L4 on 5 and 3 mm of grade 1 anterolisthesis L5 on S1 with associated bilateral L5 pars defects, similar to comparison. No other acute or suspicious osseous abnormalities. IMPRESSION: 1. Patchy consolidative and tree-in-bud nodularity in the lung bases, concerning for acute infection or inflammation. 2. Likely acute to subacute appearing anterior wedging compression deformity of the L1 vertebral body with approximately 40% height loss anteriorly and some mild surrounding thickening. Correlate for point tenderness. 3. Mild circumferential bladder wall thickening with faint perivesicular hazy stranding, suggestive of an underlying cystitis, consider correlation with urinalysis. Stable mild nonspecific perinephric stranding could be related to diminished renal function or advanced age though ascending infection is not excluded. 4. Stable appearance of a lobular fat containing ventral hernia without evidence of strangulation of the herniated fat. 5. Aortic Atherosclerosis (ICD10-I70.0). Electronically Signed   By: Lovena Le M.D.   On: 12/22/2019 16:21   DG Chest Portable 1 View  Result Date: 12/22/2019 CLINICAL DATA:  Leukocytosis EXAM: PORTABLE CHEST 1 VIEW COMPARISON:  Chest radiograph dated 09/10/2019. FINDINGS: The heart size is normal. Vascular calcifications are seen in the aortic arch. Mild right basilar atelectasis/airspace disease is noted. The left lung is clear. There is no pleural effusion or pneumothorax. The  visualized skeletal structures are unremarkable. IMPRESSION: Mild right basilar atelectasis/airspace disease. Electronically Signed   By: Zerita Boers M.D.   On: 12/22/2019 17:37       [place]   [facility] If 7PM-7AM, please contact night-coverage at [email or web address], Office  [phone]   12/24/2019, 1:11 PM  LOS: 2 days

## 2019-12-24 NOTE — TOC Transition Note (Signed)
Transition of Care [facility]) - CM/SW Discharge Note   Patient Details  Name: Brian Silva MRN: [id number] Date of Birth: 03-Mar-1941  [facility] CM/SW Contact:  Joanne Chars, LCSW Phone Number: 12/24/2019, 3:33 PM   Clinical Narrative:   Pt returning to [facility], Janie informed. PTAR called [phone].  Pt going to room 711A.  RN call report to [phone].    Final next level of care: Skilled Nursing Facility Barriers to Discharge: Barriers Resolved   Patient Goals and CMS Choice Patient states their goals for this hospitalization and ongoing recovery are:: get out of the hospital      Discharge Placement              Patient chooses bed at: [facility] Patient to be transferred to facility by: [place] Name of family member notified: patient only Patient and family notified of of transfer: 12/24/19  Discharge Plan and Services     Post Acute Care Choice:  (back to [facility])                               Social Determinants of Health (SDOH) Interventions     Readmission Risk Interventions No flowsheet data found.

## 2019-12-24 NOTE — Progress Notes (Signed)
Attempted to call report to [facility]. Left voicemail for call to be returned to [facility] [place].

## 2019-12-24 NOTE — Consult Note (Signed)
   [facility] Inpatient Consult   12/24/2019  Brian Silva 03-07-1941 [id number]    [place] Organization [ACO] Patient:  Medicare NextGen  Patient will be followed by [place] Management PAC with Medicare [employer] ACO.  Patient was identified to returning to  [facility] as the skilled facility for this transition.   Thank you.   For questions, please contact:  Natividad Brood, RN BSN [facility] Liaison  [phone] business mobile phone Toll free office [phone]  Fax number: [phone] [email or web address]

## 2019-12-25 DIAGNOSIS — E119 Type 2 diabetes mellitus without complications: Secondary | ICD-10-CM | POA: Diagnosis not present

## 2019-12-25 DIAGNOSIS — D649 Anemia, unspecified: Secondary | ICD-10-CM | POA: Diagnosis not present

## 2019-12-25 DIAGNOSIS — F320 Major depressive disorder, single episode, mild: Secondary | ICD-10-CM | POA: Diagnosis not present

## 2019-12-25 DIAGNOSIS — J189 Pneumonia, unspecified organism: Secondary | ICD-10-CM | POA: Diagnosis not present

## 2019-12-25 DIAGNOSIS — R202 Paresthesia of skin: Secondary | ICD-10-CM | POA: Diagnosis not present

## 2019-12-25 DIAGNOSIS — K920 Hematemesis: Secondary | ICD-10-CM | POA: Diagnosis not present

## 2019-12-25 DIAGNOSIS — I10 Essential (primary) hypertension: Secondary | ICD-10-CM | POA: Diagnosis not present

## 2019-12-25 DIAGNOSIS — M25531 Pain in right wrist: Secondary | ICD-10-CM | POA: Diagnosis not present

## 2019-12-25 DIAGNOSIS — Z9981 Dependence on supplemental oxygen: Secondary | ICD-10-CM | POA: Diagnosis not present

## 2019-12-25 DIAGNOSIS — G629 Polyneuropathy, unspecified: Secondary | ICD-10-CM | POA: Diagnosis not present

## 2019-12-25 DIAGNOSIS — I509 Heart failure, unspecified: Secondary | ICD-10-CM | POA: Diagnosis not present

## 2019-12-25 LAB — HIV ANTIBODY (ROUTINE TESTING W REFLEX): HIV Screen 4th Generation wRfx: NONREACTIVE

## 2019-12-27 LAB — CULTURE, BLOOD (ROUTINE X 2)
Culture: NO GROWTH
Culture: NO GROWTH
Special Requests: ADEQUATE

## 2019-12-29 DIAGNOSIS — Z20828 Contact with and (suspected) exposure to other viral communicable diseases: Secondary | ICD-10-CM | POA: Diagnosis not present

## 2019-12-30 DIAGNOSIS — E1165 Type 2 diabetes mellitus with hyperglycemia: Secondary | ICD-10-CM | POA: Diagnosis not present

## 2019-12-30 DIAGNOSIS — J9610 Chronic respiratory failure, unspecified whether with hypoxia or hypercapnia: Secondary | ICD-10-CM | POA: Diagnosis not present

## 2019-12-30 DIAGNOSIS — G4733 Obstructive sleep apnea (adult) (pediatric): Secondary | ICD-10-CM | POA: Diagnosis not present

## 2019-12-30 DIAGNOSIS — I10 Essential (primary) hypertension: Secondary | ICD-10-CM | POA: Diagnosis not present

## 2019-12-31 DIAGNOSIS — I509 Heart failure, unspecified: Secondary | ICD-10-CM | POA: Diagnosis not present

## 2019-12-31 DIAGNOSIS — R197 Diarrhea, unspecified: Secondary | ICD-10-CM | POA: Diagnosis not present

## 2019-12-31 DIAGNOSIS — D649 Anemia, unspecified: Secondary | ICD-10-CM | POA: Diagnosis not present

## 2019-12-31 DIAGNOSIS — R112 Nausea with vomiting, unspecified: Secondary | ICD-10-CM | POA: Diagnosis not present

## 2019-12-31 DIAGNOSIS — J189 Pneumonia, unspecified organism: Secondary | ICD-10-CM | POA: Diagnosis not present

## 2019-12-31 DIAGNOSIS — E119 Type 2 diabetes mellitus without complications: Secondary | ICD-10-CM | POA: Diagnosis not present

## 2019-12-31 DIAGNOSIS — M79642 Pain in left hand: Secondary | ICD-10-CM | POA: Diagnosis not present

## 2019-12-31 DIAGNOSIS — M25532 Pain in left wrist: Secondary | ICD-10-CM | POA: Diagnosis not present

## 2020-01-01 DIAGNOSIS — I10 Essential (primary) hypertension: Secondary | ICD-10-CM | POA: Diagnosis not present

## 2020-01-01 DIAGNOSIS — D649 Anemia, unspecified: Secondary | ICD-10-CM | POA: Diagnosis not present

## 2020-01-01 DIAGNOSIS — Z20828 Contact with and (suspected) exposure to other viral communicable diseases: Secondary | ICD-10-CM | POA: Diagnosis not present

## 2020-01-04 ENCOUNTER — Other Ambulatory Visit: Payer: Self-pay | Admitting: *Deleted

## 2020-01-05 DIAGNOSIS — I10 Essential (primary) hypertension: Secondary | ICD-10-CM | POA: Diagnosis not present

## 2020-01-05 DIAGNOSIS — G629 Polyneuropathy, unspecified: Secondary | ICD-10-CM | POA: Diagnosis not present

## 2020-01-05 DIAGNOSIS — R197 Diarrhea, unspecified: Secondary | ICD-10-CM | POA: Diagnosis not present

## 2020-01-05 DIAGNOSIS — E119 Type 2 diabetes mellitus without complications: Secondary | ICD-10-CM | POA: Diagnosis not present

## 2020-01-05 DIAGNOSIS — I509 Heart failure, unspecified: Secondary | ICD-10-CM | POA: Diagnosis not present

## 2020-01-05 DIAGNOSIS — D649 Anemia, unspecified: Secondary | ICD-10-CM | POA: Diagnosis not present

## 2020-01-05 NOTE — Patient Outreach (Signed)
Post-Acute Care Coordinator follow up.   Brian Silva is receiving skilled therapy at [facility].   Facility SW indicates transition plan is to return on long term care at [facility], [place], RN,BSN [place] [phone] [facility]) [phone]  (Toll free office)

## 2020-01-06 DIAGNOSIS — R109 Unspecified abdominal pain: Secondary | ICD-10-CM | POA: Diagnosis not present

## 2020-01-06 DIAGNOSIS — Z20828 Contact with and (suspected) exposure to other viral communicable diseases: Secondary | ICD-10-CM | POA: Diagnosis not present

## 2020-01-11 DIAGNOSIS — Z20828 Contact with and (suspected) exposure to other viral communicable diseases: Secondary | ICD-10-CM | POA: Diagnosis not present

## 2020-01-17 DIAGNOSIS — Z20828 Contact with and (suspected) exposure to other viral communicable diseases: Secondary | ICD-10-CM | POA: Diagnosis not present

## 2020-01-25 DIAGNOSIS — Z20828 Contact with and (suspected) exposure to other viral communicable diseases: Secondary | ICD-10-CM | POA: Diagnosis not present

## 2020-02-04 DIAGNOSIS — Z20828 Contact with and (suspected) exposure to other viral communicable diseases: Secondary | ICD-10-CM | POA: Diagnosis not present

## 2020-02-08 DIAGNOSIS — Z20828 Contact with and (suspected) exposure to other viral communicable diseases: Secondary | ICD-10-CM | POA: Diagnosis not present

## 2020-02-11 DIAGNOSIS — Z20828 Contact with and (suspected) exposure to other viral communicable diseases: Secondary | ICD-10-CM | POA: Diagnosis not present

## 2020-02-15 DIAGNOSIS — Z20828 Contact with and (suspected) exposure to other viral communicable diseases: Secondary | ICD-10-CM | POA: Diagnosis not present

## 2020-02-18 DIAGNOSIS — Z20828 Contact with and (suspected) exposure to other viral communicable diseases: Secondary | ICD-10-CM | POA: Diagnosis not present

## 2020-02-21 DIAGNOSIS — Z20828 Contact with and (suspected) exposure to other viral communicable diseases: Secondary | ICD-10-CM | POA: Diagnosis not present

## 2020-02-23 DIAGNOSIS — R262 Difficulty in walking, not elsewhere classified: Secondary | ICD-10-CM | POA: Diagnosis not present

## 2020-02-23 DIAGNOSIS — R2681 Unsteadiness on feet: Secondary | ICD-10-CM | POA: Diagnosis not present

## 2020-02-23 DIAGNOSIS — J9610 Chronic respiratory failure, unspecified whether with hypoxia or hypercapnia: Secondary | ICD-10-CM | POA: Diagnosis not present

## 2020-02-23 DIAGNOSIS — D649 Anemia, unspecified: Secondary | ICD-10-CM | POA: Diagnosis not present

## 2020-02-23 DIAGNOSIS — E1169 Type 2 diabetes mellitus with other specified complication: Secondary | ICD-10-CM | POA: Diagnosis not present

## 2020-02-23 DIAGNOSIS — M6281 Muscle weakness (generalized): Secondary | ICD-10-CM | POA: Diagnosis not present

## 2020-02-23 DIAGNOSIS — G4733 Obstructive sleep apnea (adult) (pediatric): Secondary | ICD-10-CM | POA: Diagnosis not present

## 2020-02-23 DIAGNOSIS — I700 Atherosclerosis of aorta: Secondary | ICD-10-CM | POA: Diagnosis not present

## 2020-02-23 DIAGNOSIS — E785 Hyperlipidemia, unspecified: Secondary | ICD-10-CM | POA: Diagnosis not present

## 2020-02-23 DIAGNOSIS — J45909 Unspecified asthma, uncomplicated: Secondary | ICD-10-CM | POA: Diagnosis not present

## 2020-02-24 DIAGNOSIS — E1169 Type 2 diabetes mellitus with other specified complication: Secondary | ICD-10-CM | POA: Diagnosis not present

## 2020-02-24 DIAGNOSIS — J45909 Unspecified asthma, uncomplicated: Secondary | ICD-10-CM | POA: Diagnosis not present

## 2020-02-24 DIAGNOSIS — S22000A Wedge compression fracture of unspecified thoracic vertebra, initial encounter for closed fracture: Secondary | ICD-10-CM | POA: Diagnosis not present

## 2020-02-24 DIAGNOSIS — R55 Syncope and collapse: Secondary | ICD-10-CM | POA: Diagnosis not present

## 2020-02-24 DIAGNOSIS — E1165 Type 2 diabetes mellitus with hyperglycemia: Secondary | ICD-10-CM | POA: Diagnosis not present

## 2020-02-24 DIAGNOSIS — M6281 Muscle weakness (generalized): Secondary | ICD-10-CM | POA: Diagnosis not present

## 2020-02-24 DIAGNOSIS — D649 Anemia, unspecified: Secondary | ICD-10-CM | POA: Diagnosis not present

## 2020-02-24 DIAGNOSIS — I10 Essential (primary) hypertension: Secondary | ICD-10-CM | POA: Diagnosis not present

## 2020-02-24 DIAGNOSIS — J9610 Chronic respiratory failure, unspecified whether with hypoxia or hypercapnia: Secondary | ICD-10-CM | POA: Diagnosis not present

## 2020-02-24 DIAGNOSIS — G4733 Obstructive sleep apnea (adult) (pediatric): Secondary | ICD-10-CM | POA: Diagnosis not present

## 2020-02-25 DIAGNOSIS — D649 Anemia, unspecified: Secondary | ICD-10-CM | POA: Diagnosis not present

## 2020-02-25 DIAGNOSIS — J9610 Chronic respiratory failure, unspecified whether with hypoxia or hypercapnia: Secondary | ICD-10-CM | POA: Diagnosis not present

## 2020-02-25 DIAGNOSIS — J45909 Unspecified asthma, uncomplicated: Secondary | ICD-10-CM | POA: Diagnosis not present

## 2020-02-25 DIAGNOSIS — M6281 Muscle weakness (generalized): Secondary | ICD-10-CM | POA: Diagnosis not present

## 2020-02-25 DIAGNOSIS — E1169 Type 2 diabetes mellitus with other specified complication: Secondary | ICD-10-CM | POA: Diagnosis not present

## 2020-02-26 DIAGNOSIS — M6281 Muscle weakness (generalized): Secondary | ICD-10-CM | POA: Diagnosis not present

## 2020-02-26 DIAGNOSIS — D649 Anemia, unspecified: Secondary | ICD-10-CM | POA: Diagnosis not present

## 2020-02-26 DIAGNOSIS — J45909 Unspecified asthma, uncomplicated: Secondary | ICD-10-CM | POA: Diagnosis not present

## 2020-02-26 DIAGNOSIS — E1169 Type 2 diabetes mellitus with other specified complication: Secondary | ICD-10-CM | POA: Diagnosis not present

## 2020-02-26 DIAGNOSIS — J9610 Chronic respiratory failure, unspecified whether with hypoxia or hypercapnia: Secondary | ICD-10-CM | POA: Diagnosis not present

## 2020-02-29 DIAGNOSIS — E1169 Type 2 diabetes mellitus with other specified complication: Secondary | ICD-10-CM | POA: Diagnosis not present

## 2020-02-29 DIAGNOSIS — J9610 Chronic respiratory failure, unspecified whether with hypoxia or hypercapnia: Secondary | ICD-10-CM | POA: Diagnosis not present

## 2020-02-29 DIAGNOSIS — J45909 Unspecified asthma, uncomplicated: Secondary | ICD-10-CM | POA: Diagnosis not present

## 2020-02-29 DIAGNOSIS — D649 Anemia, unspecified: Secondary | ICD-10-CM | POA: Diagnosis not present

## 2020-02-29 DIAGNOSIS — M6281 Muscle weakness (generalized): Secondary | ICD-10-CM | POA: Diagnosis not present

## 2020-03-01 DIAGNOSIS — M6281 Muscle weakness (generalized): Secondary | ICD-10-CM | POA: Diagnosis not present

## 2020-03-01 DIAGNOSIS — J45909 Unspecified asthma, uncomplicated: Secondary | ICD-10-CM | POA: Diagnosis not present

## 2020-03-01 DIAGNOSIS — J9610 Chronic respiratory failure, unspecified whether with hypoxia or hypercapnia: Secondary | ICD-10-CM | POA: Diagnosis not present

## 2020-03-01 DIAGNOSIS — E1169 Type 2 diabetes mellitus with other specified complication: Secondary | ICD-10-CM | POA: Diagnosis not present

## 2020-03-01 DIAGNOSIS — D649 Anemia, unspecified: Secondary | ICD-10-CM | POA: Diagnosis not present

## 2020-03-02 DIAGNOSIS — E1169 Type 2 diabetes mellitus with other specified complication: Secondary | ICD-10-CM | POA: Diagnosis not present

## 2020-03-02 DIAGNOSIS — D649 Anemia, unspecified: Secondary | ICD-10-CM | POA: Diagnosis not present

## 2020-03-02 DIAGNOSIS — M6281 Muscle weakness (generalized): Secondary | ICD-10-CM | POA: Diagnosis not present

## 2020-03-02 DIAGNOSIS — J9610 Chronic respiratory failure, unspecified whether with hypoxia or hypercapnia: Secondary | ICD-10-CM | POA: Diagnosis not present

## 2020-03-02 DIAGNOSIS — J45909 Unspecified asthma, uncomplicated: Secondary | ICD-10-CM | POA: Diagnosis not present

## 2020-03-03 DIAGNOSIS — J45909 Unspecified asthma, uncomplicated: Secondary | ICD-10-CM | POA: Diagnosis not present

## 2020-03-03 DIAGNOSIS — D649 Anemia, unspecified: Secondary | ICD-10-CM | POA: Diagnosis not present

## 2020-03-03 DIAGNOSIS — J9610 Chronic respiratory failure, unspecified whether with hypoxia or hypercapnia: Secondary | ICD-10-CM | POA: Diagnosis not present

## 2020-03-03 DIAGNOSIS — E1169 Type 2 diabetes mellitus with other specified complication: Secondary | ICD-10-CM | POA: Diagnosis not present

## 2020-03-03 DIAGNOSIS — M6281 Muscle weakness (generalized): Secondary | ICD-10-CM | POA: Diagnosis not present

## 2020-03-04 DIAGNOSIS — J9610 Chronic respiratory failure, unspecified whether with hypoxia or hypercapnia: Secondary | ICD-10-CM | POA: Diagnosis not present

## 2020-03-04 DIAGNOSIS — M6281 Muscle weakness (generalized): Secondary | ICD-10-CM | POA: Diagnosis not present

## 2020-03-04 DIAGNOSIS — E1169 Type 2 diabetes mellitus with other specified complication: Secondary | ICD-10-CM | POA: Diagnosis not present

## 2020-03-04 DIAGNOSIS — D649 Anemia, unspecified: Secondary | ICD-10-CM | POA: Diagnosis not present

## 2020-03-04 DIAGNOSIS — J45909 Unspecified asthma, uncomplicated: Secondary | ICD-10-CM | POA: Diagnosis not present

## 2020-03-07 DIAGNOSIS — J45909 Unspecified asthma, uncomplicated: Secondary | ICD-10-CM | POA: Diagnosis not present

## 2020-03-07 DIAGNOSIS — D649 Anemia, unspecified: Secondary | ICD-10-CM | POA: Diagnosis not present

## 2020-03-07 DIAGNOSIS — E1169 Type 2 diabetes mellitus with other specified complication: Secondary | ICD-10-CM | POA: Diagnosis not present

## 2020-03-07 DIAGNOSIS — M6281 Muscle weakness (generalized): Secondary | ICD-10-CM | POA: Diagnosis not present

## 2020-03-07 DIAGNOSIS — J9610 Chronic respiratory failure, unspecified whether with hypoxia or hypercapnia: Secondary | ICD-10-CM | POA: Diagnosis not present

## 2020-03-09 DIAGNOSIS — J45909 Unspecified asthma, uncomplicated: Secondary | ICD-10-CM | POA: Diagnosis not present

## 2020-03-09 DIAGNOSIS — M6281 Muscle weakness (generalized): Secondary | ICD-10-CM | POA: Diagnosis not present

## 2020-03-09 DIAGNOSIS — J9610 Chronic respiratory failure, unspecified whether with hypoxia or hypercapnia: Secondary | ICD-10-CM | POA: Diagnosis not present

## 2020-03-09 DIAGNOSIS — E1169 Type 2 diabetes mellitus with other specified complication: Secondary | ICD-10-CM | POA: Diagnosis not present

## 2020-03-09 DIAGNOSIS — D649 Anemia, unspecified: Secondary | ICD-10-CM | POA: Diagnosis not present

## 2020-03-10 DIAGNOSIS — D649 Anemia, unspecified: Secondary | ICD-10-CM | POA: Diagnosis not present

## 2020-03-10 DIAGNOSIS — J45909 Unspecified asthma, uncomplicated: Secondary | ICD-10-CM | POA: Diagnosis not present

## 2020-03-10 DIAGNOSIS — E1169 Type 2 diabetes mellitus with other specified complication: Secondary | ICD-10-CM | POA: Diagnosis not present

## 2020-03-10 DIAGNOSIS — J9610 Chronic respiratory failure, unspecified whether with hypoxia or hypercapnia: Secondary | ICD-10-CM | POA: Diagnosis not present

## 2020-03-10 DIAGNOSIS — M6281 Muscle weakness (generalized): Secondary | ICD-10-CM | POA: Diagnosis not present

## 2020-03-11 DIAGNOSIS — J45909 Unspecified asthma, uncomplicated: Secondary | ICD-10-CM | POA: Diagnosis not present

## 2020-03-11 DIAGNOSIS — J9610 Chronic respiratory failure, unspecified whether with hypoxia or hypercapnia: Secondary | ICD-10-CM | POA: Diagnosis not present

## 2020-03-11 DIAGNOSIS — M6281 Muscle weakness (generalized): Secondary | ICD-10-CM | POA: Diagnosis not present

## 2020-03-11 DIAGNOSIS — D649 Anemia, unspecified: Secondary | ICD-10-CM | POA: Diagnosis not present

## 2020-03-11 DIAGNOSIS — E1169 Type 2 diabetes mellitus with other specified complication: Secondary | ICD-10-CM | POA: Diagnosis not present

## 2020-03-12 DIAGNOSIS — J9610 Chronic respiratory failure, unspecified whether with hypoxia or hypercapnia: Secondary | ICD-10-CM | POA: Diagnosis not present

## 2020-03-12 DIAGNOSIS — M6281 Muscle weakness (generalized): Secondary | ICD-10-CM | POA: Diagnosis not present

## 2020-03-12 DIAGNOSIS — J45909 Unspecified asthma, uncomplicated: Secondary | ICD-10-CM | POA: Diagnosis not present

## 2020-03-12 DIAGNOSIS — D649 Anemia, unspecified: Secondary | ICD-10-CM | POA: Diagnosis not present

## 2020-03-12 DIAGNOSIS — E1169 Type 2 diabetes mellitus with other specified complication: Secondary | ICD-10-CM | POA: Diagnosis not present

## 2020-03-14 DIAGNOSIS — J45909 Unspecified asthma, uncomplicated: Secondary | ICD-10-CM | POA: Diagnosis not present

## 2020-03-14 DIAGNOSIS — J9610 Chronic respiratory failure, unspecified whether with hypoxia or hypercapnia: Secondary | ICD-10-CM | POA: Diagnosis not present

## 2020-03-14 DIAGNOSIS — M6281 Muscle weakness (generalized): Secondary | ICD-10-CM | POA: Diagnosis not present

## 2020-03-14 DIAGNOSIS — D649 Anemia, unspecified: Secondary | ICD-10-CM | POA: Diagnosis not present

## 2020-03-14 DIAGNOSIS — E1169 Type 2 diabetes mellitus with other specified complication: Secondary | ICD-10-CM | POA: Diagnosis not present

## 2020-03-15 DIAGNOSIS — E1169 Type 2 diabetes mellitus with other specified complication: Secondary | ICD-10-CM | POA: Diagnosis not present

## 2020-03-15 DIAGNOSIS — J9610 Chronic respiratory failure, unspecified whether with hypoxia or hypercapnia: Secondary | ICD-10-CM | POA: Diagnosis not present

## 2020-03-15 DIAGNOSIS — J45909 Unspecified asthma, uncomplicated: Secondary | ICD-10-CM | POA: Diagnosis not present

## 2020-03-15 DIAGNOSIS — D649 Anemia, unspecified: Secondary | ICD-10-CM | POA: Diagnosis not present

## 2020-03-15 DIAGNOSIS — M6281 Muscle weakness (generalized): Secondary | ICD-10-CM | POA: Diagnosis not present

## 2020-03-16 DIAGNOSIS — E1169 Type 2 diabetes mellitus with other specified complication: Secondary | ICD-10-CM | POA: Diagnosis not present

## 2020-03-16 DIAGNOSIS — J9610 Chronic respiratory failure, unspecified whether with hypoxia or hypercapnia: Secondary | ICD-10-CM | POA: Diagnosis not present

## 2020-03-16 DIAGNOSIS — J45909 Unspecified asthma, uncomplicated: Secondary | ICD-10-CM | POA: Diagnosis not present

## 2020-03-16 DIAGNOSIS — D649 Anemia, unspecified: Secondary | ICD-10-CM | POA: Diagnosis not present

## 2020-03-16 DIAGNOSIS — M6281 Muscle weakness (generalized): Secondary | ICD-10-CM | POA: Diagnosis not present

## 2020-03-17 DIAGNOSIS — D649 Anemia, unspecified: Secondary | ICD-10-CM | POA: Diagnosis not present

## 2020-03-17 DIAGNOSIS — E1169 Type 2 diabetes mellitus with other specified complication: Secondary | ICD-10-CM | POA: Diagnosis not present

## 2020-03-17 DIAGNOSIS — M6281 Muscle weakness (generalized): Secondary | ICD-10-CM | POA: Diagnosis not present

## 2020-03-17 DIAGNOSIS — J45909 Unspecified asthma, uncomplicated: Secondary | ICD-10-CM | POA: Diagnosis not present

## 2020-03-17 DIAGNOSIS — J9610 Chronic respiratory failure, unspecified whether with hypoxia or hypercapnia: Secondary | ICD-10-CM | POA: Diagnosis not present

## 2020-03-18 DIAGNOSIS — E1169 Type 2 diabetes mellitus with other specified complication: Secondary | ICD-10-CM | POA: Diagnosis not present

## 2020-03-18 DIAGNOSIS — J9610 Chronic respiratory failure, unspecified whether with hypoxia or hypercapnia: Secondary | ICD-10-CM | POA: Diagnosis not present

## 2020-03-18 DIAGNOSIS — D649 Anemia, unspecified: Secondary | ICD-10-CM | POA: Diagnosis not present

## 2020-03-18 DIAGNOSIS — M6281 Muscle weakness (generalized): Secondary | ICD-10-CM | POA: Diagnosis not present

## 2020-03-18 DIAGNOSIS — J45909 Unspecified asthma, uncomplicated: Secondary | ICD-10-CM | POA: Diagnosis not present

## 2020-03-21 DIAGNOSIS — J45909 Unspecified asthma, uncomplicated: Secondary | ICD-10-CM | POA: Diagnosis not present

## 2020-03-21 DIAGNOSIS — D649 Anemia, unspecified: Secondary | ICD-10-CM | POA: Diagnosis not present

## 2020-03-21 DIAGNOSIS — E1169 Type 2 diabetes mellitus with other specified complication: Secondary | ICD-10-CM | POA: Diagnosis not present

## 2020-03-21 DIAGNOSIS — M6281 Muscle weakness (generalized): Secondary | ICD-10-CM | POA: Diagnosis not present

## 2020-03-21 DIAGNOSIS — J9610 Chronic respiratory failure, unspecified whether with hypoxia or hypercapnia: Secondary | ICD-10-CM | POA: Diagnosis not present

## 2020-03-22 DIAGNOSIS — D649 Anemia, unspecified: Secondary | ICD-10-CM | POA: Diagnosis not present

## 2020-03-22 DIAGNOSIS — E1169 Type 2 diabetes mellitus with other specified complication: Secondary | ICD-10-CM | POA: Diagnosis not present

## 2020-03-22 DIAGNOSIS — J9610 Chronic respiratory failure, unspecified whether with hypoxia or hypercapnia: Secondary | ICD-10-CM | POA: Diagnosis not present

## 2020-03-22 DIAGNOSIS — M6281 Muscle weakness (generalized): Secondary | ICD-10-CM | POA: Diagnosis not present

## 2020-03-22 DIAGNOSIS — J45909 Unspecified asthma, uncomplicated: Secondary | ICD-10-CM | POA: Diagnosis not present

## 2020-03-23 DIAGNOSIS — R262 Difficulty in walking, not elsewhere classified: Secondary | ICD-10-CM | POA: Diagnosis not present

## 2020-03-23 DIAGNOSIS — I700 Atherosclerosis of aorta: Secondary | ICD-10-CM | POA: Diagnosis not present

## 2020-03-23 DIAGNOSIS — R2681 Unsteadiness on feet: Secondary | ICD-10-CM | POA: Diagnosis not present

## 2020-03-23 DIAGNOSIS — J45909 Unspecified asthma, uncomplicated: Secondary | ICD-10-CM | POA: Diagnosis not present

## 2020-03-23 DIAGNOSIS — J9610 Chronic respiratory failure, unspecified whether with hypoxia or hypercapnia: Secondary | ICD-10-CM | POA: Diagnosis not present

## 2020-03-23 DIAGNOSIS — E1169 Type 2 diabetes mellitus with other specified complication: Secondary | ICD-10-CM | POA: Diagnosis not present

## 2020-03-23 DIAGNOSIS — M6281 Muscle weakness (generalized): Secondary | ICD-10-CM | POA: Diagnosis not present

## 2020-03-23 DIAGNOSIS — D649 Anemia, unspecified: Secondary | ICD-10-CM | POA: Diagnosis not present

## 2020-03-23 DIAGNOSIS — E785 Hyperlipidemia, unspecified: Secondary | ICD-10-CM | POA: Diagnosis not present

## 2020-03-23 DIAGNOSIS — G4733 Obstructive sleep apnea (adult) (pediatric): Secondary | ICD-10-CM | POA: Diagnosis not present

## 2020-03-24 DIAGNOSIS — E1169 Type 2 diabetes mellitus with other specified complication: Secondary | ICD-10-CM | POA: Diagnosis not present

## 2020-03-24 DIAGNOSIS — J45909 Unspecified asthma, uncomplicated: Secondary | ICD-10-CM | POA: Diagnosis not present

## 2020-03-24 DIAGNOSIS — M6281 Muscle weakness (generalized): Secondary | ICD-10-CM | POA: Diagnosis not present

## 2020-03-24 DIAGNOSIS — J9610 Chronic respiratory failure, unspecified whether with hypoxia or hypercapnia: Secondary | ICD-10-CM | POA: Diagnosis not present

## 2020-03-24 DIAGNOSIS — D649 Anemia, unspecified: Secondary | ICD-10-CM | POA: Diagnosis not present

## 2020-03-25 DIAGNOSIS — E1169 Type 2 diabetes mellitus with other specified complication: Secondary | ICD-10-CM | POA: Diagnosis not present

## 2020-03-25 DIAGNOSIS — J45909 Unspecified asthma, uncomplicated: Secondary | ICD-10-CM | POA: Diagnosis not present

## 2020-03-25 DIAGNOSIS — D649 Anemia, unspecified: Secondary | ICD-10-CM | POA: Diagnosis not present

## 2020-03-25 DIAGNOSIS — J9610 Chronic respiratory failure, unspecified whether with hypoxia or hypercapnia: Secondary | ICD-10-CM | POA: Diagnosis not present

## 2020-03-25 DIAGNOSIS — M6281 Muscle weakness (generalized): Secondary | ICD-10-CM | POA: Diagnosis not present

## 2020-03-28 DIAGNOSIS — E1169 Type 2 diabetes mellitus with other specified complication: Secondary | ICD-10-CM | POA: Diagnosis not present

## 2020-03-28 DIAGNOSIS — M6281 Muscle weakness (generalized): Secondary | ICD-10-CM | POA: Diagnosis not present

## 2020-03-28 DIAGNOSIS — D649 Anemia, unspecified: Secondary | ICD-10-CM | POA: Diagnosis not present

## 2020-03-28 DIAGNOSIS — J9610 Chronic respiratory failure, unspecified whether with hypoxia or hypercapnia: Secondary | ICD-10-CM | POA: Diagnosis not present

## 2020-03-28 DIAGNOSIS — J45909 Unspecified asthma, uncomplicated: Secondary | ICD-10-CM | POA: Diagnosis not present

## 2020-03-29 DIAGNOSIS — E1169 Type 2 diabetes mellitus with other specified complication: Secondary | ICD-10-CM | POA: Diagnosis not present

## 2020-03-29 DIAGNOSIS — D649 Anemia, unspecified: Secondary | ICD-10-CM | POA: Diagnosis not present

## 2020-03-29 DIAGNOSIS — J9610 Chronic respiratory failure, unspecified whether with hypoxia or hypercapnia: Secondary | ICD-10-CM | POA: Diagnosis not present

## 2020-03-29 DIAGNOSIS — J45909 Unspecified asthma, uncomplicated: Secondary | ICD-10-CM | POA: Diagnosis not present

## 2020-03-29 DIAGNOSIS — Z20822 Contact with and (suspected) exposure to covid-19: Secondary | ICD-10-CM | POA: Diagnosis not present

## 2020-03-29 DIAGNOSIS — M6281 Muscle weakness (generalized): Secondary | ICD-10-CM | POA: Diagnosis not present

## 2020-03-30 DIAGNOSIS — E1169 Type 2 diabetes mellitus with other specified complication: Secondary | ICD-10-CM | POA: Diagnosis not present

## 2020-03-30 DIAGNOSIS — J45909 Unspecified asthma, uncomplicated: Secondary | ICD-10-CM | POA: Diagnosis not present

## 2020-03-30 DIAGNOSIS — M6281 Muscle weakness (generalized): Secondary | ICD-10-CM | POA: Diagnosis not present

## 2020-03-30 DIAGNOSIS — D649 Anemia, unspecified: Secondary | ICD-10-CM | POA: Diagnosis not present

## 2020-03-30 DIAGNOSIS — J9610 Chronic respiratory failure, unspecified whether with hypoxia or hypercapnia: Secondary | ICD-10-CM | POA: Diagnosis not present

## 2020-03-31 DIAGNOSIS — J9610 Chronic respiratory failure, unspecified whether with hypoxia or hypercapnia: Secondary | ICD-10-CM | POA: Diagnosis not present

## 2020-03-31 DIAGNOSIS — J45909 Unspecified asthma, uncomplicated: Secondary | ICD-10-CM | POA: Diagnosis not present

## 2020-03-31 DIAGNOSIS — M6281 Muscle weakness (generalized): Secondary | ICD-10-CM | POA: Diagnosis not present

## 2020-03-31 DIAGNOSIS — Z20822 Contact with and (suspected) exposure to covid-19: Secondary | ICD-10-CM | POA: Diagnosis not present

## 2020-03-31 DIAGNOSIS — D649 Anemia, unspecified: Secondary | ICD-10-CM | POA: Diagnosis not present

## 2020-03-31 DIAGNOSIS — E1169 Type 2 diabetes mellitus with other specified complication: Secondary | ICD-10-CM | POA: Diagnosis not present

## 2020-04-01 DIAGNOSIS — J45909 Unspecified asthma, uncomplicated: Secondary | ICD-10-CM | POA: Diagnosis not present

## 2020-04-01 DIAGNOSIS — E1169 Type 2 diabetes mellitus with other specified complication: Secondary | ICD-10-CM | POA: Diagnosis not present

## 2020-04-01 DIAGNOSIS — J9610 Chronic respiratory failure, unspecified whether with hypoxia or hypercapnia: Secondary | ICD-10-CM | POA: Diagnosis not present

## 2020-04-01 DIAGNOSIS — D649 Anemia, unspecified: Secondary | ICD-10-CM | POA: Diagnosis not present

## 2020-04-01 DIAGNOSIS — M6281 Muscle weakness (generalized): Secondary | ICD-10-CM | POA: Diagnosis not present

## 2020-04-04 DIAGNOSIS — J9610 Chronic respiratory failure, unspecified whether with hypoxia or hypercapnia: Secondary | ICD-10-CM | POA: Diagnosis not present

## 2020-04-04 DIAGNOSIS — D649 Anemia, unspecified: Secondary | ICD-10-CM | POA: Diagnosis not present

## 2020-04-04 DIAGNOSIS — M6281 Muscle weakness (generalized): Secondary | ICD-10-CM | POA: Diagnosis not present

## 2020-04-04 DIAGNOSIS — J45909 Unspecified asthma, uncomplicated: Secondary | ICD-10-CM | POA: Diagnosis not present

## 2020-04-04 DIAGNOSIS — E1169 Type 2 diabetes mellitus with other specified complication: Secondary | ICD-10-CM | POA: Diagnosis not present

## 2020-04-05 DIAGNOSIS — M6281 Muscle weakness (generalized): Secondary | ICD-10-CM | POA: Diagnosis not present

## 2020-04-05 DIAGNOSIS — J45909 Unspecified asthma, uncomplicated: Secondary | ICD-10-CM | POA: Diagnosis not present

## 2020-04-05 DIAGNOSIS — D649 Anemia, unspecified: Secondary | ICD-10-CM | POA: Diagnosis not present

## 2020-04-05 DIAGNOSIS — J9610 Chronic respiratory failure, unspecified whether with hypoxia or hypercapnia: Secondary | ICD-10-CM | POA: Diagnosis not present

## 2020-04-05 DIAGNOSIS — E1169 Type 2 diabetes mellitus with other specified complication: Secondary | ICD-10-CM | POA: Diagnosis not present

## 2020-04-06 DIAGNOSIS — J9610 Chronic respiratory failure, unspecified whether with hypoxia or hypercapnia: Secondary | ICD-10-CM | POA: Diagnosis not present

## 2020-04-06 DIAGNOSIS — E1165 Type 2 diabetes mellitus with hyperglycemia: Secondary | ICD-10-CM | POA: Diagnosis not present

## 2020-04-06 DIAGNOSIS — S22000A Wedge compression fracture of unspecified thoracic vertebra, initial encounter for closed fracture: Secondary | ICD-10-CM | POA: Diagnosis not present

## 2020-04-06 DIAGNOSIS — I10 Essential (primary) hypertension: Secondary | ICD-10-CM | POA: Diagnosis not present

## 2020-04-06 DIAGNOSIS — E1169 Type 2 diabetes mellitus with other specified complication: Secondary | ICD-10-CM | POA: Diagnosis not present

## 2020-04-06 DIAGNOSIS — G4733 Obstructive sleep apnea (adult) (pediatric): Secondary | ICD-10-CM | POA: Diagnosis not present

## 2020-04-06 DIAGNOSIS — J45909 Unspecified asthma, uncomplicated: Secondary | ICD-10-CM | POA: Diagnosis not present

## 2020-04-06 DIAGNOSIS — D649 Anemia, unspecified: Secondary | ICD-10-CM | POA: Diagnosis not present

## 2020-04-06 DIAGNOSIS — R55 Syncope and collapse: Secondary | ICD-10-CM | POA: Diagnosis not present

## 2020-04-06 DIAGNOSIS — M6281 Muscle weakness (generalized): Secondary | ICD-10-CM | POA: Diagnosis not present

## 2020-04-07 DIAGNOSIS — J9610 Chronic respiratory failure, unspecified whether with hypoxia or hypercapnia: Secondary | ICD-10-CM | POA: Diagnosis not present

## 2020-04-07 DIAGNOSIS — E1169 Type 2 diabetes mellitus with other specified complication: Secondary | ICD-10-CM | POA: Diagnosis not present

## 2020-04-07 DIAGNOSIS — D649 Anemia, unspecified: Secondary | ICD-10-CM | POA: Diagnosis not present

## 2020-04-07 DIAGNOSIS — J45909 Unspecified asthma, uncomplicated: Secondary | ICD-10-CM | POA: Diagnosis not present

## 2020-04-07 DIAGNOSIS — M6281 Muscle weakness (generalized): Secondary | ICD-10-CM | POA: Diagnosis not present

## 2020-04-08 DIAGNOSIS — E1169 Type 2 diabetes mellitus with other specified complication: Secondary | ICD-10-CM | POA: Diagnosis not present

## 2020-04-08 DIAGNOSIS — J45909 Unspecified asthma, uncomplicated: Secondary | ICD-10-CM | POA: Diagnosis not present

## 2020-04-08 DIAGNOSIS — D649 Anemia, unspecified: Secondary | ICD-10-CM | POA: Diagnosis not present

## 2020-04-08 DIAGNOSIS — J9610 Chronic respiratory failure, unspecified whether with hypoxia or hypercapnia: Secondary | ICD-10-CM | POA: Diagnosis not present

## 2020-04-08 DIAGNOSIS — M6281 Muscle weakness (generalized): Secondary | ICD-10-CM | POA: Diagnosis not present

## 2020-04-11 DIAGNOSIS — M6281 Muscle weakness (generalized): Secondary | ICD-10-CM | POA: Diagnosis not present

## 2020-04-11 DIAGNOSIS — D649 Anemia, unspecified: Secondary | ICD-10-CM | POA: Diagnosis not present

## 2020-04-11 DIAGNOSIS — E1169 Type 2 diabetes mellitus with other specified complication: Secondary | ICD-10-CM | POA: Diagnosis not present

## 2020-04-11 DIAGNOSIS — J9610 Chronic respiratory failure, unspecified whether with hypoxia or hypercapnia: Secondary | ICD-10-CM | POA: Diagnosis not present

## 2020-04-11 DIAGNOSIS — J45909 Unspecified asthma, uncomplicated: Secondary | ICD-10-CM | POA: Diagnosis not present

## 2020-04-12 DIAGNOSIS — J9610 Chronic respiratory failure, unspecified whether with hypoxia or hypercapnia: Secondary | ICD-10-CM | POA: Diagnosis not present

## 2020-04-12 DIAGNOSIS — E1169 Type 2 diabetes mellitus with other specified complication: Secondary | ICD-10-CM | POA: Diagnosis not present

## 2020-04-12 DIAGNOSIS — J45909 Unspecified asthma, uncomplicated: Secondary | ICD-10-CM | POA: Diagnosis not present

## 2020-04-12 DIAGNOSIS — D649 Anemia, unspecified: Secondary | ICD-10-CM | POA: Diagnosis not present

## 2020-04-12 DIAGNOSIS — M6281 Muscle weakness (generalized): Secondary | ICD-10-CM | POA: Diagnosis not present

## 2020-04-13 DIAGNOSIS — E1169 Type 2 diabetes mellitus with other specified complication: Secondary | ICD-10-CM | POA: Diagnosis not present

## 2020-04-13 DIAGNOSIS — M6281 Muscle weakness (generalized): Secondary | ICD-10-CM | POA: Diagnosis not present

## 2020-04-13 DIAGNOSIS — D649 Anemia, unspecified: Secondary | ICD-10-CM | POA: Diagnosis not present

## 2020-04-13 DIAGNOSIS — J9610 Chronic respiratory failure, unspecified whether with hypoxia or hypercapnia: Secondary | ICD-10-CM | POA: Diagnosis not present

## 2020-04-13 DIAGNOSIS — J45909 Unspecified asthma, uncomplicated: Secondary | ICD-10-CM | POA: Diagnosis not present

## 2020-04-14 DIAGNOSIS — E1169 Type 2 diabetes mellitus with other specified complication: Secondary | ICD-10-CM | POA: Diagnosis not present

## 2020-04-14 DIAGNOSIS — D649 Anemia, unspecified: Secondary | ICD-10-CM | POA: Diagnosis not present

## 2020-04-14 DIAGNOSIS — M6281 Muscle weakness (generalized): Secondary | ICD-10-CM | POA: Diagnosis not present

## 2020-04-14 DIAGNOSIS — J45909 Unspecified asthma, uncomplicated: Secondary | ICD-10-CM | POA: Diagnosis not present

## 2020-04-14 DIAGNOSIS — J9610 Chronic respiratory failure, unspecified whether with hypoxia or hypercapnia: Secondary | ICD-10-CM | POA: Diagnosis not present

## 2020-04-15 DIAGNOSIS — J45909 Unspecified asthma, uncomplicated: Secondary | ICD-10-CM | POA: Diagnosis not present

## 2020-04-15 DIAGNOSIS — E1169 Type 2 diabetes mellitus with other specified complication: Secondary | ICD-10-CM | POA: Diagnosis not present

## 2020-04-15 DIAGNOSIS — M6281 Muscle weakness (generalized): Secondary | ICD-10-CM | POA: Diagnosis not present

## 2020-04-15 DIAGNOSIS — D649 Anemia, unspecified: Secondary | ICD-10-CM | POA: Diagnosis not present

## 2020-04-15 DIAGNOSIS — J9610 Chronic respiratory failure, unspecified whether with hypoxia or hypercapnia: Secondary | ICD-10-CM | POA: Diagnosis not present

## 2020-04-18 DIAGNOSIS — J45909 Unspecified asthma, uncomplicated: Secondary | ICD-10-CM | POA: Diagnosis not present

## 2020-04-18 DIAGNOSIS — M6281 Muscle weakness (generalized): Secondary | ICD-10-CM | POA: Diagnosis not present

## 2020-04-18 DIAGNOSIS — J9610 Chronic respiratory failure, unspecified whether with hypoxia or hypercapnia: Secondary | ICD-10-CM | POA: Diagnosis not present

## 2020-04-18 DIAGNOSIS — D649 Anemia, unspecified: Secondary | ICD-10-CM | POA: Diagnosis not present

## 2020-04-18 DIAGNOSIS — E1169 Type 2 diabetes mellitus with other specified complication: Secondary | ICD-10-CM | POA: Diagnosis not present

## 2020-04-19 DIAGNOSIS — E1169 Type 2 diabetes mellitus with other specified complication: Secondary | ICD-10-CM | POA: Diagnosis not present

## 2020-04-19 DIAGNOSIS — D649 Anemia, unspecified: Secondary | ICD-10-CM | POA: Diagnosis not present

## 2020-04-19 DIAGNOSIS — J45909 Unspecified asthma, uncomplicated: Secondary | ICD-10-CM | POA: Diagnosis not present

## 2020-04-19 DIAGNOSIS — M6281 Muscle weakness (generalized): Secondary | ICD-10-CM | POA: Diagnosis not present

## 2020-04-19 DIAGNOSIS — J9610 Chronic respiratory failure, unspecified whether with hypoxia or hypercapnia: Secondary | ICD-10-CM | POA: Diagnosis not present

## 2020-04-20 DIAGNOSIS — D649 Anemia, unspecified: Secondary | ICD-10-CM | POA: Diagnosis not present

## 2020-04-20 DIAGNOSIS — J45909 Unspecified asthma, uncomplicated: Secondary | ICD-10-CM | POA: Diagnosis not present

## 2020-04-20 DIAGNOSIS — M6281 Muscle weakness (generalized): Secondary | ICD-10-CM | POA: Diagnosis not present

## 2020-04-20 DIAGNOSIS — E1169 Type 2 diabetes mellitus with other specified complication: Secondary | ICD-10-CM | POA: Diagnosis not present

## 2020-04-20 DIAGNOSIS — J9610 Chronic respiratory failure, unspecified whether with hypoxia or hypercapnia: Secondary | ICD-10-CM | POA: Diagnosis not present

## 2020-04-21 DIAGNOSIS — J9610 Chronic respiratory failure, unspecified whether with hypoxia or hypercapnia: Secondary | ICD-10-CM | POA: Diagnosis not present

## 2020-04-21 DIAGNOSIS — J45909 Unspecified asthma, uncomplicated: Secondary | ICD-10-CM | POA: Diagnosis not present

## 2020-04-21 DIAGNOSIS — D649 Anemia, unspecified: Secondary | ICD-10-CM | POA: Diagnosis not present

## 2020-04-21 DIAGNOSIS — E1169 Type 2 diabetes mellitus with other specified complication: Secondary | ICD-10-CM | POA: Diagnosis not present

## 2020-04-21 DIAGNOSIS — M6281 Muscle weakness (generalized): Secondary | ICD-10-CM | POA: Diagnosis not present

## 2020-04-22 DIAGNOSIS — E1169 Type 2 diabetes mellitus with other specified complication: Secondary | ICD-10-CM | POA: Diagnosis not present

## 2020-04-22 DIAGNOSIS — J45909 Unspecified asthma, uncomplicated: Secondary | ICD-10-CM | POA: Diagnosis not present

## 2020-04-22 DIAGNOSIS — M6281 Muscle weakness (generalized): Secondary | ICD-10-CM | POA: Diagnosis not present

## 2020-04-22 DIAGNOSIS — D649 Anemia, unspecified: Secondary | ICD-10-CM | POA: Diagnosis not present

## 2020-04-22 DIAGNOSIS — J9610 Chronic respiratory failure, unspecified whether with hypoxia or hypercapnia: Secondary | ICD-10-CM | POA: Diagnosis not present

## 2020-04-25 DIAGNOSIS — M6281 Muscle weakness (generalized): Secondary | ICD-10-CM | POA: Diagnosis not present

## 2020-04-25 DIAGNOSIS — I700 Atherosclerosis of aorta: Secondary | ICD-10-CM | POA: Diagnosis not present

## 2020-04-25 DIAGNOSIS — J9610 Chronic respiratory failure, unspecified whether with hypoxia or hypercapnia: Secondary | ICD-10-CM | POA: Diagnosis not present

## 2020-04-25 DIAGNOSIS — R2681 Unsteadiness on feet: Secondary | ICD-10-CM | POA: Diagnosis not present

## 2020-04-25 DIAGNOSIS — E785 Hyperlipidemia, unspecified: Secondary | ICD-10-CM | POA: Diagnosis not present

## 2020-04-25 DIAGNOSIS — J45909 Unspecified asthma, uncomplicated: Secondary | ICD-10-CM | POA: Diagnosis not present

## 2020-04-25 DIAGNOSIS — D649 Anemia, unspecified: Secondary | ICD-10-CM | POA: Diagnosis not present

## 2020-04-25 DIAGNOSIS — G4733 Obstructive sleep apnea (adult) (pediatric): Secondary | ICD-10-CM | POA: Diagnosis not present

## 2020-04-25 DIAGNOSIS — E1169 Type 2 diabetes mellitus with other specified complication: Secondary | ICD-10-CM | POA: Diagnosis not present

## 2020-04-25 DIAGNOSIS — R262 Difficulty in walking, not elsewhere classified: Secondary | ICD-10-CM | POA: Diagnosis not present

## 2020-04-26 DIAGNOSIS — J45909 Unspecified asthma, uncomplicated: Secondary | ICD-10-CM | POA: Diagnosis not present

## 2020-04-26 DIAGNOSIS — J9610 Chronic respiratory failure, unspecified whether with hypoxia or hypercapnia: Secondary | ICD-10-CM | POA: Diagnosis not present

## 2020-04-26 DIAGNOSIS — E1169 Type 2 diabetes mellitus with other specified complication: Secondary | ICD-10-CM | POA: Diagnosis not present

## 2020-04-26 DIAGNOSIS — M6281 Muscle weakness (generalized): Secondary | ICD-10-CM | POA: Diagnosis not present

## 2020-04-26 DIAGNOSIS — D649 Anemia, unspecified: Secondary | ICD-10-CM | POA: Diagnosis not present

## 2020-04-27 DIAGNOSIS — J45909 Unspecified asthma, uncomplicated: Secondary | ICD-10-CM | POA: Diagnosis not present

## 2020-04-27 DIAGNOSIS — M6281 Muscle weakness (generalized): Secondary | ICD-10-CM | POA: Diagnosis not present

## 2020-04-27 DIAGNOSIS — E1169 Type 2 diabetes mellitus with other specified complication: Secondary | ICD-10-CM | POA: Diagnosis not present

## 2020-04-27 DIAGNOSIS — J9610 Chronic respiratory failure, unspecified whether with hypoxia or hypercapnia: Secondary | ICD-10-CM | POA: Diagnosis not present

## 2020-04-27 DIAGNOSIS — D649 Anemia, unspecified: Secondary | ICD-10-CM | POA: Diagnosis not present

## 2020-04-28 DIAGNOSIS — D649 Anemia, unspecified: Secondary | ICD-10-CM | POA: Diagnosis not present

## 2020-04-28 DIAGNOSIS — E1169 Type 2 diabetes mellitus with other specified complication: Secondary | ICD-10-CM | POA: Diagnosis not present

## 2020-04-28 DIAGNOSIS — J45909 Unspecified asthma, uncomplicated: Secondary | ICD-10-CM | POA: Diagnosis not present

## 2020-04-28 DIAGNOSIS — J9610 Chronic respiratory failure, unspecified whether with hypoxia or hypercapnia: Secondary | ICD-10-CM | POA: Diagnosis not present

## 2020-04-28 DIAGNOSIS — M6281 Muscle weakness (generalized): Secondary | ICD-10-CM | POA: Diagnosis not present

## 2020-04-29 DIAGNOSIS — J9610 Chronic respiratory failure, unspecified whether with hypoxia or hypercapnia: Secondary | ICD-10-CM | POA: Diagnosis not present

## 2020-04-29 DIAGNOSIS — E1169 Type 2 diabetes mellitus with other specified complication: Secondary | ICD-10-CM | POA: Diagnosis not present

## 2020-04-29 DIAGNOSIS — M6281 Muscle weakness (generalized): Secondary | ICD-10-CM | POA: Diagnosis not present

## 2020-04-29 DIAGNOSIS — D649 Anemia, unspecified: Secondary | ICD-10-CM | POA: Diagnosis not present

## 2020-04-29 DIAGNOSIS — J45909 Unspecified asthma, uncomplicated: Secondary | ICD-10-CM | POA: Diagnosis not present

## 2020-05-02 DIAGNOSIS — M6281 Muscle weakness (generalized): Secondary | ICD-10-CM | POA: Diagnosis not present

## 2020-05-02 DIAGNOSIS — E1169 Type 2 diabetes mellitus with other specified complication: Secondary | ICD-10-CM | POA: Diagnosis not present

## 2020-05-02 DIAGNOSIS — J45909 Unspecified asthma, uncomplicated: Secondary | ICD-10-CM | POA: Diagnosis not present

## 2020-05-02 DIAGNOSIS — D649 Anemia, unspecified: Secondary | ICD-10-CM | POA: Diagnosis not present

## 2020-05-02 DIAGNOSIS — J9610 Chronic respiratory failure, unspecified whether with hypoxia or hypercapnia: Secondary | ICD-10-CM | POA: Diagnosis not present

## 2020-05-03 DIAGNOSIS — J45909 Unspecified asthma, uncomplicated: Secondary | ICD-10-CM | POA: Diagnosis not present

## 2020-05-03 DIAGNOSIS — D649 Anemia, unspecified: Secondary | ICD-10-CM | POA: Diagnosis not present

## 2020-05-03 DIAGNOSIS — M6281 Muscle weakness (generalized): Secondary | ICD-10-CM | POA: Diagnosis not present

## 2020-05-03 DIAGNOSIS — E1169 Type 2 diabetes mellitus with other specified complication: Secondary | ICD-10-CM | POA: Diagnosis not present

## 2020-05-03 DIAGNOSIS — J9610 Chronic respiratory failure, unspecified whether with hypoxia or hypercapnia: Secondary | ICD-10-CM | POA: Diagnosis not present

## 2020-05-04 DIAGNOSIS — E1169 Type 2 diabetes mellitus with other specified complication: Secondary | ICD-10-CM | POA: Diagnosis not present

## 2020-05-04 DIAGNOSIS — M6281 Muscle weakness (generalized): Secondary | ICD-10-CM | POA: Diagnosis not present

## 2020-05-04 DIAGNOSIS — J45909 Unspecified asthma, uncomplicated: Secondary | ICD-10-CM | POA: Diagnosis not present

## 2020-05-04 DIAGNOSIS — J9610 Chronic respiratory failure, unspecified whether with hypoxia or hypercapnia: Secondary | ICD-10-CM | POA: Diagnosis not present

## 2020-05-04 DIAGNOSIS — D649 Anemia, unspecified: Secondary | ICD-10-CM | POA: Diagnosis not present

## 2020-05-07 DIAGNOSIS — D649 Anemia, unspecified: Secondary | ICD-10-CM | POA: Diagnosis not present

## 2020-05-07 DIAGNOSIS — M6281 Muscle weakness (generalized): Secondary | ICD-10-CM | POA: Diagnosis not present

## 2020-05-07 DIAGNOSIS — E1169 Type 2 diabetes mellitus with other specified complication: Secondary | ICD-10-CM | POA: Diagnosis not present

## 2020-05-07 DIAGNOSIS — J9610 Chronic respiratory failure, unspecified whether with hypoxia or hypercapnia: Secondary | ICD-10-CM | POA: Diagnosis not present

## 2020-05-07 DIAGNOSIS — J45909 Unspecified asthma, uncomplicated: Secondary | ICD-10-CM | POA: Diagnosis not present

## 2020-05-10 DIAGNOSIS — E1169 Type 2 diabetes mellitus with other specified complication: Secondary | ICD-10-CM | POA: Diagnosis not present

## 2020-05-10 DIAGNOSIS — J9610 Chronic respiratory failure, unspecified whether with hypoxia or hypercapnia: Secondary | ICD-10-CM | POA: Diagnosis not present

## 2020-05-10 DIAGNOSIS — J45909 Unspecified asthma, uncomplicated: Secondary | ICD-10-CM | POA: Diagnosis not present

## 2020-05-10 DIAGNOSIS — M6281 Muscle weakness (generalized): Secondary | ICD-10-CM | POA: Diagnosis not present

## 2020-05-10 DIAGNOSIS — D649 Anemia, unspecified: Secondary | ICD-10-CM | POA: Diagnosis not present

## 2020-05-11 DIAGNOSIS — D649 Anemia, unspecified: Secondary | ICD-10-CM | POA: Diagnosis not present

## 2020-05-11 DIAGNOSIS — J45909 Unspecified asthma, uncomplicated: Secondary | ICD-10-CM | POA: Diagnosis not present

## 2020-05-11 DIAGNOSIS — M6281 Muscle weakness (generalized): Secondary | ICD-10-CM | POA: Diagnosis not present

## 2020-05-11 DIAGNOSIS — E1169 Type 2 diabetes mellitus with other specified complication: Secondary | ICD-10-CM | POA: Diagnosis not present

## 2020-05-11 DIAGNOSIS — J9610 Chronic respiratory failure, unspecified whether with hypoxia or hypercapnia: Secondary | ICD-10-CM | POA: Diagnosis not present

## 2020-05-13 DIAGNOSIS — J9610 Chronic respiratory failure, unspecified whether with hypoxia or hypercapnia: Secondary | ICD-10-CM | POA: Diagnosis not present

## 2020-05-13 DIAGNOSIS — M6281 Muscle weakness (generalized): Secondary | ICD-10-CM | POA: Diagnosis not present

## 2020-05-13 DIAGNOSIS — J45909 Unspecified asthma, uncomplicated: Secondary | ICD-10-CM | POA: Diagnosis not present

## 2020-05-13 DIAGNOSIS — D649 Anemia, unspecified: Secondary | ICD-10-CM | POA: Diagnosis not present

## 2020-05-13 DIAGNOSIS — E1169 Type 2 diabetes mellitus with other specified complication: Secondary | ICD-10-CM | POA: Diagnosis not present

## 2020-05-14 DIAGNOSIS — J9610 Chronic respiratory failure, unspecified whether with hypoxia or hypercapnia: Secondary | ICD-10-CM | POA: Diagnosis not present

## 2020-05-14 DIAGNOSIS — M6281 Muscle weakness (generalized): Secondary | ICD-10-CM | POA: Diagnosis not present

## 2020-05-14 DIAGNOSIS — D649 Anemia, unspecified: Secondary | ICD-10-CM | POA: Diagnosis not present

## 2020-05-14 DIAGNOSIS — J45909 Unspecified asthma, uncomplicated: Secondary | ICD-10-CM | POA: Diagnosis not present

## 2020-05-14 DIAGNOSIS — E1169 Type 2 diabetes mellitus with other specified complication: Secondary | ICD-10-CM | POA: Diagnosis not present

## 2020-05-16 DIAGNOSIS — M6281 Muscle weakness (generalized): Secondary | ICD-10-CM | POA: Diagnosis not present

## 2020-05-16 DIAGNOSIS — D649 Anemia, unspecified: Secondary | ICD-10-CM | POA: Diagnosis not present

## 2020-05-16 DIAGNOSIS — J45909 Unspecified asthma, uncomplicated: Secondary | ICD-10-CM | POA: Diagnosis not present

## 2020-05-16 DIAGNOSIS — J9610 Chronic respiratory failure, unspecified whether with hypoxia or hypercapnia: Secondary | ICD-10-CM | POA: Diagnosis not present

## 2020-05-16 DIAGNOSIS — E1169 Type 2 diabetes mellitus with other specified complication: Secondary | ICD-10-CM | POA: Diagnosis not present

## 2020-05-17 DIAGNOSIS — E1169 Type 2 diabetes mellitus with other specified complication: Secondary | ICD-10-CM | POA: Diagnosis not present

## 2020-05-17 DIAGNOSIS — J45909 Unspecified asthma, uncomplicated: Secondary | ICD-10-CM | POA: Diagnosis not present

## 2020-05-17 DIAGNOSIS — J9610 Chronic respiratory failure, unspecified whether with hypoxia or hypercapnia: Secondary | ICD-10-CM | POA: Diagnosis not present

## 2020-05-17 DIAGNOSIS — D649 Anemia, unspecified: Secondary | ICD-10-CM | POA: Diagnosis not present

## 2020-05-17 DIAGNOSIS — M6281 Muscle weakness (generalized): Secondary | ICD-10-CM | POA: Diagnosis not present

## 2020-05-18 DIAGNOSIS — J9610 Chronic respiratory failure, unspecified whether with hypoxia or hypercapnia: Secondary | ICD-10-CM | POA: Diagnosis not present

## 2020-05-18 DIAGNOSIS — D649 Anemia, unspecified: Secondary | ICD-10-CM | POA: Diagnosis not present

## 2020-05-18 DIAGNOSIS — E1169 Type 2 diabetes mellitus with other specified complication: Secondary | ICD-10-CM | POA: Diagnosis not present

## 2020-05-18 DIAGNOSIS — M6281 Muscle weakness (generalized): Secondary | ICD-10-CM | POA: Diagnosis not present

## 2020-05-18 DIAGNOSIS — J45909 Unspecified asthma, uncomplicated: Secondary | ICD-10-CM | POA: Diagnosis not present

## 2020-05-19 DIAGNOSIS — E1169 Type 2 diabetes mellitus with other specified complication: Secondary | ICD-10-CM | POA: Diagnosis not present

## 2020-05-19 DIAGNOSIS — D649 Anemia, unspecified: Secondary | ICD-10-CM | POA: Diagnosis not present

## 2020-05-19 DIAGNOSIS — J9610 Chronic respiratory failure, unspecified whether with hypoxia or hypercapnia: Secondary | ICD-10-CM | POA: Diagnosis not present

## 2020-05-19 DIAGNOSIS — M6281 Muscle weakness (generalized): Secondary | ICD-10-CM | POA: Diagnosis not present

## 2020-05-19 DIAGNOSIS — J45909 Unspecified asthma, uncomplicated: Secondary | ICD-10-CM | POA: Diagnosis not present

## 2020-05-20 DIAGNOSIS — D649 Anemia, unspecified: Secondary | ICD-10-CM | POA: Diagnosis not present

## 2020-05-20 DIAGNOSIS — M25511 Pain in right shoulder: Secondary | ICD-10-CM | POA: Diagnosis not present

## 2020-05-20 DIAGNOSIS — J9610 Chronic respiratory failure, unspecified whether with hypoxia or hypercapnia: Secondary | ICD-10-CM | POA: Diagnosis not present

## 2020-05-20 DIAGNOSIS — E1169 Type 2 diabetes mellitus with other specified complication: Secondary | ICD-10-CM | POA: Diagnosis not present

## 2020-05-20 DIAGNOSIS — M6281 Muscle weakness (generalized): Secondary | ICD-10-CM | POA: Diagnosis not present

## 2020-05-20 DIAGNOSIS — J45909 Unspecified asthma, uncomplicated: Secondary | ICD-10-CM | POA: Diagnosis not present

## 2020-05-23 DIAGNOSIS — M6281 Muscle weakness (generalized): Secondary | ICD-10-CM | POA: Diagnosis not present

## 2020-05-23 DIAGNOSIS — J9610 Chronic respiratory failure, unspecified whether with hypoxia or hypercapnia: Secondary | ICD-10-CM | POA: Diagnosis not present

## 2020-05-23 DIAGNOSIS — J45909 Unspecified asthma, uncomplicated: Secondary | ICD-10-CM | POA: Diagnosis not present

## 2020-05-23 DIAGNOSIS — E1169 Type 2 diabetes mellitus with other specified complication: Secondary | ICD-10-CM | POA: Diagnosis not present

## 2020-05-23 DIAGNOSIS — D649 Anemia, unspecified: Secondary | ICD-10-CM | POA: Diagnosis not present

## 2020-05-24 DIAGNOSIS — R2681 Unsteadiness on feet: Secondary | ICD-10-CM | POA: Diagnosis not present

## 2020-05-24 DIAGNOSIS — I700 Atherosclerosis of aorta: Secondary | ICD-10-CM | POA: Diagnosis not present

## 2020-05-24 DIAGNOSIS — J9610 Chronic respiratory failure, unspecified whether with hypoxia or hypercapnia: Secondary | ICD-10-CM | POA: Diagnosis not present

## 2020-05-24 DIAGNOSIS — E785 Hyperlipidemia, unspecified: Secondary | ICD-10-CM | POA: Diagnosis not present

## 2020-05-24 DIAGNOSIS — M6281 Muscle weakness (generalized): Secondary | ICD-10-CM | POA: Diagnosis not present

## 2020-05-24 DIAGNOSIS — G4733 Obstructive sleep apnea (adult) (pediatric): Secondary | ICD-10-CM | POA: Diagnosis not present

## 2020-05-24 DIAGNOSIS — D649 Anemia, unspecified: Secondary | ICD-10-CM | POA: Diagnosis not present

## 2020-05-24 DIAGNOSIS — E1169 Type 2 diabetes mellitus with other specified complication: Secondary | ICD-10-CM | POA: Diagnosis not present

## 2020-05-24 DIAGNOSIS — J45909 Unspecified asthma, uncomplicated: Secondary | ICD-10-CM | POA: Diagnosis not present

## 2020-05-24 DIAGNOSIS — R262 Difficulty in walking, not elsewhere classified: Secondary | ICD-10-CM | POA: Diagnosis not present

## 2020-05-25 DIAGNOSIS — D649 Anemia, unspecified: Secondary | ICD-10-CM | POA: Diagnosis not present

## 2020-05-25 DIAGNOSIS — E1169 Type 2 diabetes mellitus with other specified complication: Secondary | ICD-10-CM | POA: Diagnosis not present

## 2020-05-25 DIAGNOSIS — J45909 Unspecified asthma, uncomplicated: Secondary | ICD-10-CM | POA: Diagnosis not present

## 2020-05-25 DIAGNOSIS — J9610 Chronic respiratory failure, unspecified whether with hypoxia or hypercapnia: Secondary | ICD-10-CM | POA: Diagnosis not present

## 2020-05-25 DIAGNOSIS — M6281 Muscle weakness (generalized): Secondary | ICD-10-CM | POA: Diagnosis not present

## 2020-05-26 DIAGNOSIS — E119 Type 2 diabetes mellitus without complications: Secondary | ICD-10-CM | POA: Diagnosis not present

## 2020-05-26 DIAGNOSIS — J45909 Unspecified asthma, uncomplicated: Secondary | ICD-10-CM | POA: Diagnosis not present

## 2020-05-26 DIAGNOSIS — M6281 Muscle weakness (generalized): Secondary | ICD-10-CM | POA: Diagnosis not present

## 2020-05-26 DIAGNOSIS — D649 Anemia, unspecified: Secondary | ICD-10-CM | POA: Diagnosis not present

## 2020-05-26 DIAGNOSIS — J9610 Chronic respiratory failure, unspecified whether with hypoxia or hypercapnia: Secondary | ICD-10-CM | POA: Diagnosis not present

## 2020-05-26 DIAGNOSIS — I10 Essential (primary) hypertension: Secondary | ICD-10-CM | POA: Diagnosis not present

## 2020-05-26 DIAGNOSIS — E1169 Type 2 diabetes mellitus with other specified complication: Secondary | ICD-10-CM | POA: Diagnosis not present

## 2020-05-27 DIAGNOSIS — J9610 Chronic respiratory failure, unspecified whether with hypoxia or hypercapnia: Secondary | ICD-10-CM | POA: Diagnosis not present

## 2020-05-27 DIAGNOSIS — E1169 Type 2 diabetes mellitus with other specified complication: Secondary | ICD-10-CM | POA: Diagnosis not present

## 2020-05-27 DIAGNOSIS — M6281 Muscle weakness (generalized): Secondary | ICD-10-CM | POA: Diagnosis not present

## 2020-05-27 DIAGNOSIS — D649 Anemia, unspecified: Secondary | ICD-10-CM | POA: Diagnosis not present

## 2020-05-27 DIAGNOSIS — J45909 Unspecified asthma, uncomplicated: Secondary | ICD-10-CM | POA: Diagnosis not present

## 2020-05-27 DIAGNOSIS — G629 Polyneuropathy, unspecified: Secondary | ICD-10-CM | POA: Diagnosis not present

## 2020-05-27 DIAGNOSIS — F320 Major depressive disorder, single episode, mild: Secondary | ICD-10-CM | POA: Diagnosis not present

## 2020-05-27 DIAGNOSIS — I509 Heart failure, unspecified: Secondary | ICD-10-CM | POA: Diagnosis not present

## 2020-05-30 DIAGNOSIS — M6281 Muscle weakness (generalized): Secondary | ICD-10-CM | POA: Diagnosis not present

## 2020-05-30 DIAGNOSIS — J9610 Chronic respiratory failure, unspecified whether with hypoxia or hypercapnia: Secondary | ICD-10-CM | POA: Diagnosis not present

## 2020-05-30 DIAGNOSIS — J45909 Unspecified asthma, uncomplicated: Secondary | ICD-10-CM | POA: Diagnosis not present

## 2020-05-30 DIAGNOSIS — D649 Anemia, unspecified: Secondary | ICD-10-CM | POA: Diagnosis not present

## 2020-05-30 DIAGNOSIS — E1169 Type 2 diabetes mellitus with other specified complication: Secondary | ICD-10-CM | POA: Diagnosis not present

## 2020-05-31 DIAGNOSIS — D649 Anemia, unspecified: Secondary | ICD-10-CM | POA: Diagnosis not present

## 2020-05-31 DIAGNOSIS — J45909 Unspecified asthma, uncomplicated: Secondary | ICD-10-CM | POA: Diagnosis not present

## 2020-05-31 DIAGNOSIS — J9610 Chronic respiratory failure, unspecified whether with hypoxia or hypercapnia: Secondary | ICD-10-CM | POA: Diagnosis not present

## 2020-05-31 DIAGNOSIS — M6281 Muscle weakness (generalized): Secondary | ICD-10-CM | POA: Diagnosis not present

## 2020-05-31 DIAGNOSIS — E1169 Type 2 diabetes mellitus with other specified complication: Secondary | ICD-10-CM | POA: Diagnosis not present

## 2020-06-01 DIAGNOSIS — E1169 Type 2 diabetes mellitus with other specified complication: Secondary | ICD-10-CM | POA: Diagnosis not present

## 2020-06-01 DIAGNOSIS — D649 Anemia, unspecified: Secondary | ICD-10-CM | POA: Diagnosis not present

## 2020-06-01 DIAGNOSIS — J9610 Chronic respiratory failure, unspecified whether with hypoxia or hypercapnia: Secondary | ICD-10-CM | POA: Diagnosis not present

## 2020-06-01 DIAGNOSIS — M6281 Muscle weakness (generalized): Secondary | ICD-10-CM | POA: Diagnosis not present

## 2020-06-01 DIAGNOSIS — J45909 Unspecified asthma, uncomplicated: Secondary | ICD-10-CM | POA: Diagnosis not present

## 2020-06-02 DIAGNOSIS — D649 Anemia, unspecified: Secondary | ICD-10-CM | POA: Diagnosis not present

## 2020-06-02 DIAGNOSIS — J45909 Unspecified asthma, uncomplicated: Secondary | ICD-10-CM | POA: Diagnosis not present

## 2020-06-02 DIAGNOSIS — M6281 Muscle weakness (generalized): Secondary | ICD-10-CM | POA: Diagnosis not present

## 2020-06-02 DIAGNOSIS — E1169 Type 2 diabetes mellitus with other specified complication: Secondary | ICD-10-CM | POA: Diagnosis not present

## 2020-06-02 DIAGNOSIS — J9610 Chronic respiratory failure, unspecified whether with hypoxia or hypercapnia: Secondary | ICD-10-CM | POA: Diagnosis not present

## 2020-06-03 DIAGNOSIS — J45909 Unspecified asthma, uncomplicated: Secondary | ICD-10-CM | POA: Diagnosis not present

## 2020-06-03 DIAGNOSIS — M6281 Muscle weakness (generalized): Secondary | ICD-10-CM | POA: Diagnosis not present

## 2020-06-03 DIAGNOSIS — D649 Anemia, unspecified: Secondary | ICD-10-CM | POA: Diagnosis not present

## 2020-06-03 DIAGNOSIS — E1169 Type 2 diabetes mellitus with other specified complication: Secondary | ICD-10-CM | POA: Diagnosis not present

## 2020-06-03 DIAGNOSIS — J9610 Chronic respiratory failure, unspecified whether with hypoxia or hypercapnia: Secondary | ICD-10-CM | POA: Diagnosis not present

## 2020-06-06 DIAGNOSIS — F320 Major depressive disorder, single episode, mild: Secondary | ICD-10-CM | POA: Diagnosis not present

## 2020-06-06 DIAGNOSIS — G629 Polyneuropathy, unspecified: Secondary | ICD-10-CM | POA: Diagnosis not present

## 2020-06-06 DIAGNOSIS — I509 Heart failure, unspecified: Secondary | ICD-10-CM | POA: Diagnosis not present

## 2020-06-07 DIAGNOSIS — G600 Hereditary motor and sensory neuropathy: Secondary | ICD-10-CM | POA: Diagnosis not present

## 2020-06-08 DIAGNOSIS — M6281 Muscle weakness (generalized): Secondary | ICD-10-CM | POA: Diagnosis not present

## 2020-06-08 DIAGNOSIS — E1169 Type 2 diabetes mellitus with other specified complication: Secondary | ICD-10-CM | POA: Diagnosis not present

## 2020-06-08 DIAGNOSIS — J9610 Chronic respiratory failure, unspecified whether with hypoxia or hypercapnia: Secondary | ICD-10-CM | POA: Diagnosis not present

## 2020-06-08 DIAGNOSIS — D649 Anemia, unspecified: Secondary | ICD-10-CM | POA: Diagnosis not present

## 2020-06-08 DIAGNOSIS — J45909 Unspecified asthma, uncomplicated: Secondary | ICD-10-CM | POA: Diagnosis not present

## 2020-06-09 DIAGNOSIS — E1169 Type 2 diabetes mellitus with other specified complication: Secondary | ICD-10-CM | POA: Diagnosis not present

## 2020-06-09 DIAGNOSIS — J9610 Chronic respiratory failure, unspecified whether with hypoxia or hypercapnia: Secondary | ICD-10-CM | POA: Diagnosis not present

## 2020-06-10 DIAGNOSIS — I10 Essential (primary) hypertension: Secondary | ICD-10-CM | POA: Diagnosis not present

## 2020-06-11 DIAGNOSIS — E1169 Type 2 diabetes mellitus with other specified complication: Secondary | ICD-10-CM | POA: Diagnosis not present

## 2020-06-11 DIAGNOSIS — M6281 Muscle weakness (generalized): Secondary | ICD-10-CM | POA: Diagnosis not present

## 2020-06-11 DIAGNOSIS — J45909 Unspecified asthma, uncomplicated: Secondary | ICD-10-CM | POA: Diagnosis not present

## 2020-06-11 DIAGNOSIS — D649 Anemia, unspecified: Secondary | ICD-10-CM | POA: Diagnosis not present

## 2020-06-11 DIAGNOSIS — J9610 Chronic respiratory failure, unspecified whether with hypoxia or hypercapnia: Secondary | ICD-10-CM | POA: Diagnosis not present

## 2020-06-14 DIAGNOSIS — D649 Anemia, unspecified: Secondary | ICD-10-CM | POA: Diagnosis not present

## 2020-06-14 DIAGNOSIS — J9610 Chronic respiratory failure, unspecified whether with hypoxia or hypercapnia: Secondary | ICD-10-CM | POA: Diagnosis not present

## 2020-06-14 DIAGNOSIS — J45909 Unspecified asthma, uncomplicated: Secondary | ICD-10-CM | POA: Diagnosis not present

## 2020-06-14 DIAGNOSIS — M6281 Muscle weakness (generalized): Secondary | ICD-10-CM | POA: Diagnosis not present

## 2020-06-14 DIAGNOSIS — E1169 Type 2 diabetes mellitus with other specified complication: Secondary | ICD-10-CM | POA: Diagnosis not present

## 2020-06-15 DIAGNOSIS — E1169 Type 2 diabetes mellitus with other specified complication: Secondary | ICD-10-CM | POA: Diagnosis not present

## 2020-06-15 DIAGNOSIS — D649 Anemia, unspecified: Secondary | ICD-10-CM | POA: Diagnosis not present

## 2020-06-15 DIAGNOSIS — J45909 Unspecified asthma, uncomplicated: Secondary | ICD-10-CM | POA: Diagnosis not present

## 2020-06-15 DIAGNOSIS — J9610 Chronic respiratory failure, unspecified whether with hypoxia or hypercapnia: Secondary | ICD-10-CM | POA: Diagnosis not present

## 2020-06-15 DIAGNOSIS — M6281 Muscle weakness (generalized): Secondary | ICD-10-CM | POA: Diagnosis not present

## 2020-06-16 DIAGNOSIS — M6281 Muscle weakness (generalized): Secondary | ICD-10-CM | POA: Diagnosis not present

## 2020-06-16 DIAGNOSIS — D649 Anemia, unspecified: Secondary | ICD-10-CM | POA: Diagnosis not present

## 2020-06-16 DIAGNOSIS — E1169 Type 2 diabetes mellitus with other specified complication: Secondary | ICD-10-CM | POA: Diagnosis not present

## 2020-06-16 DIAGNOSIS — J9610 Chronic respiratory failure, unspecified whether with hypoxia or hypercapnia: Secondary | ICD-10-CM | POA: Diagnosis not present

## 2020-06-16 DIAGNOSIS — J45909 Unspecified asthma, uncomplicated: Secondary | ICD-10-CM | POA: Diagnosis not present

## 2020-06-17 DIAGNOSIS — J9610 Chronic respiratory failure, unspecified whether with hypoxia or hypercapnia: Secondary | ICD-10-CM | POA: Diagnosis not present

## 2020-06-17 DIAGNOSIS — D649 Anemia, unspecified: Secondary | ICD-10-CM | POA: Diagnosis not present

## 2020-06-17 DIAGNOSIS — M6281 Muscle weakness (generalized): Secondary | ICD-10-CM | POA: Diagnosis not present

## 2020-06-17 DIAGNOSIS — J45909 Unspecified asthma, uncomplicated: Secondary | ICD-10-CM | POA: Diagnosis not present

## 2020-06-17 DIAGNOSIS — E1169 Type 2 diabetes mellitus with other specified complication: Secondary | ICD-10-CM | POA: Diagnosis not present

## 2020-06-20 DIAGNOSIS — J45909 Unspecified asthma, uncomplicated: Secondary | ICD-10-CM | POA: Diagnosis not present

## 2020-06-20 DIAGNOSIS — J9610 Chronic respiratory failure, unspecified whether with hypoxia or hypercapnia: Secondary | ICD-10-CM | POA: Diagnosis not present

## 2020-06-20 DIAGNOSIS — E1169 Type 2 diabetes mellitus with other specified complication: Secondary | ICD-10-CM | POA: Diagnosis not present

## 2020-06-20 DIAGNOSIS — M6281 Muscle weakness (generalized): Secondary | ICD-10-CM | POA: Diagnosis not present

## 2020-06-20 DIAGNOSIS — D649 Anemia, unspecified: Secondary | ICD-10-CM | POA: Diagnosis not present

## 2020-06-21 DIAGNOSIS — R262 Difficulty in walking, not elsewhere classified: Secondary | ICD-10-CM | POA: Diagnosis not present

## 2020-06-21 DIAGNOSIS — M6281 Muscle weakness (generalized): Secondary | ICD-10-CM | POA: Diagnosis not present

## 2020-06-21 DIAGNOSIS — E785 Hyperlipidemia, unspecified: Secondary | ICD-10-CM | POA: Diagnosis not present

## 2020-06-21 DIAGNOSIS — J9610 Chronic respiratory failure, unspecified whether with hypoxia or hypercapnia: Secondary | ICD-10-CM | POA: Diagnosis not present

## 2020-06-21 DIAGNOSIS — J45909 Unspecified asthma, uncomplicated: Secondary | ICD-10-CM | POA: Diagnosis not present

## 2020-06-21 DIAGNOSIS — G4733 Obstructive sleep apnea (adult) (pediatric): Secondary | ICD-10-CM | POA: Diagnosis not present

## 2020-06-21 DIAGNOSIS — I700 Atherosclerosis of aorta: Secondary | ICD-10-CM | POA: Diagnosis not present

## 2020-06-21 DIAGNOSIS — E1169 Type 2 diabetes mellitus with other specified complication: Secondary | ICD-10-CM | POA: Diagnosis not present

## 2020-06-21 DIAGNOSIS — D649 Anemia, unspecified: Secondary | ICD-10-CM | POA: Diagnosis not present

## 2020-06-21 DIAGNOSIS — R2681 Unsteadiness on feet: Secondary | ICD-10-CM | POA: Diagnosis not present

## 2020-06-22 DIAGNOSIS — D649 Anemia, unspecified: Secondary | ICD-10-CM | POA: Diagnosis not present

## 2020-06-22 DIAGNOSIS — E1169 Type 2 diabetes mellitus with other specified complication: Secondary | ICD-10-CM | POA: Diagnosis not present

## 2020-06-22 DIAGNOSIS — J45909 Unspecified asthma, uncomplicated: Secondary | ICD-10-CM | POA: Diagnosis not present

## 2020-06-22 DIAGNOSIS — M6281 Muscle weakness (generalized): Secondary | ICD-10-CM | POA: Diagnosis not present

## 2020-06-22 DIAGNOSIS — J9610 Chronic respiratory failure, unspecified whether with hypoxia or hypercapnia: Secondary | ICD-10-CM | POA: Diagnosis not present

## 2020-06-23 DIAGNOSIS — J9610 Chronic respiratory failure, unspecified whether with hypoxia or hypercapnia: Secondary | ICD-10-CM | POA: Diagnosis not present

## 2020-06-23 DIAGNOSIS — E1169 Type 2 diabetes mellitus with other specified complication: Secondary | ICD-10-CM | POA: Diagnosis not present

## 2020-06-23 DIAGNOSIS — D649 Anemia, unspecified: Secondary | ICD-10-CM | POA: Diagnosis not present

## 2020-06-23 DIAGNOSIS — M6281 Muscle weakness (generalized): Secondary | ICD-10-CM | POA: Diagnosis not present

## 2020-06-23 DIAGNOSIS — J45909 Unspecified asthma, uncomplicated: Secondary | ICD-10-CM | POA: Diagnosis not present

## 2020-06-24 DIAGNOSIS — J45909 Unspecified asthma, uncomplicated: Secondary | ICD-10-CM | POA: Diagnosis not present

## 2020-06-24 DIAGNOSIS — M6281 Muscle weakness (generalized): Secondary | ICD-10-CM | POA: Diagnosis not present

## 2020-06-24 DIAGNOSIS — J9610 Chronic respiratory failure, unspecified whether with hypoxia or hypercapnia: Secondary | ICD-10-CM | POA: Diagnosis not present

## 2020-06-24 DIAGNOSIS — E1169 Type 2 diabetes mellitus with other specified complication: Secondary | ICD-10-CM | POA: Diagnosis not present

## 2020-06-24 DIAGNOSIS — D649 Anemia, unspecified: Secondary | ICD-10-CM | POA: Diagnosis not present

## 2020-06-27 DIAGNOSIS — D649 Anemia, unspecified: Secondary | ICD-10-CM | POA: Diagnosis not present

## 2020-06-27 DIAGNOSIS — J45909 Unspecified asthma, uncomplicated: Secondary | ICD-10-CM | POA: Diagnosis not present

## 2020-06-27 DIAGNOSIS — E1169 Type 2 diabetes mellitus with other specified complication: Secondary | ICD-10-CM | POA: Diagnosis not present

## 2020-06-27 DIAGNOSIS — M6281 Muscle weakness (generalized): Secondary | ICD-10-CM | POA: Diagnosis not present

## 2020-06-27 DIAGNOSIS — J9610 Chronic respiratory failure, unspecified whether with hypoxia or hypercapnia: Secondary | ICD-10-CM | POA: Diagnosis not present

## 2020-06-28 DIAGNOSIS — I509 Heart failure, unspecified: Secondary | ICD-10-CM | POA: Diagnosis not present

## 2020-06-28 DIAGNOSIS — F320 Major depressive disorder, single episode, mild: Secondary | ICD-10-CM | POA: Diagnosis not present

## 2020-06-28 DIAGNOSIS — I10 Essential (primary) hypertension: Secondary | ICD-10-CM | POA: Diagnosis not present

## 2020-06-28 DIAGNOSIS — M79642 Pain in left hand: Secondary | ICD-10-CM | POA: Diagnosis not present

## 2020-06-29 DIAGNOSIS — E1169 Type 2 diabetes mellitus with other specified complication: Secondary | ICD-10-CM | POA: Diagnosis not present

## 2020-06-29 DIAGNOSIS — J45909 Unspecified asthma, uncomplicated: Secondary | ICD-10-CM | POA: Diagnosis not present

## 2020-06-29 DIAGNOSIS — J9610 Chronic respiratory failure, unspecified whether with hypoxia or hypercapnia: Secondary | ICD-10-CM | POA: Diagnosis not present

## 2020-06-29 DIAGNOSIS — M6281 Muscle weakness (generalized): Secondary | ICD-10-CM | POA: Diagnosis not present

## 2020-06-29 DIAGNOSIS — D649 Anemia, unspecified: Secondary | ICD-10-CM | POA: Diagnosis not present

## 2020-06-30 DIAGNOSIS — D649 Anemia, unspecified: Secondary | ICD-10-CM | POA: Diagnosis not present

## 2020-06-30 DIAGNOSIS — E1169 Type 2 diabetes mellitus with other specified complication: Secondary | ICD-10-CM | POA: Diagnosis not present

## 2020-06-30 DIAGNOSIS — M6281 Muscle weakness (generalized): Secondary | ICD-10-CM | POA: Diagnosis not present

## 2020-06-30 DIAGNOSIS — J45909 Unspecified asthma, uncomplicated: Secondary | ICD-10-CM | POA: Diagnosis not present

## 2020-06-30 DIAGNOSIS — J9610 Chronic respiratory failure, unspecified whether with hypoxia or hypercapnia: Secondary | ICD-10-CM | POA: Diagnosis not present

## 2020-07-01 DIAGNOSIS — D649 Anemia, unspecified: Secondary | ICD-10-CM | POA: Diagnosis not present

## 2020-07-01 DIAGNOSIS — J9610 Chronic respiratory failure, unspecified whether with hypoxia or hypercapnia: Secondary | ICD-10-CM | POA: Diagnosis not present

## 2020-07-01 DIAGNOSIS — J45909 Unspecified asthma, uncomplicated: Secondary | ICD-10-CM | POA: Diagnosis not present

## 2020-07-01 DIAGNOSIS — M6281 Muscle weakness (generalized): Secondary | ICD-10-CM | POA: Diagnosis not present

## 2020-07-01 DIAGNOSIS — E1169 Type 2 diabetes mellitus with other specified complication: Secondary | ICD-10-CM | POA: Diagnosis not present

## 2020-07-04 DIAGNOSIS — G629 Polyneuropathy, unspecified: Secondary | ICD-10-CM | POA: Diagnosis not present

## 2020-07-04 DIAGNOSIS — R059 Cough, unspecified: Secondary | ICD-10-CM | POA: Diagnosis not present

## 2020-07-04 DIAGNOSIS — I509 Heart failure, unspecified: Secondary | ICD-10-CM | POA: Diagnosis not present

## 2020-07-04 DIAGNOSIS — Z9981 Dependence on supplemental oxygen: Secondary | ICD-10-CM | POA: Diagnosis not present

## 2020-07-04 DIAGNOSIS — R079 Chest pain, unspecified: Secondary | ICD-10-CM | POA: Diagnosis not present

## 2020-07-05 DIAGNOSIS — J45909 Unspecified asthma, uncomplicated: Secondary | ICD-10-CM | POA: Diagnosis not present

## 2020-07-05 DIAGNOSIS — M6281 Muscle weakness (generalized): Secondary | ICD-10-CM | POA: Diagnosis not present

## 2020-07-05 DIAGNOSIS — G629 Polyneuropathy, unspecified: Secondary | ICD-10-CM | POA: Diagnosis not present

## 2020-07-05 DIAGNOSIS — E1169 Type 2 diabetes mellitus with other specified complication: Secondary | ICD-10-CM | POA: Diagnosis not present

## 2020-07-05 DIAGNOSIS — R070 Pain in throat: Secondary | ICD-10-CM | POA: Diagnosis not present

## 2020-07-05 DIAGNOSIS — J9610 Chronic respiratory failure, unspecified whether with hypoxia or hypercapnia: Secondary | ICD-10-CM | POA: Diagnosis not present

## 2020-07-05 DIAGNOSIS — R062 Wheezing: Secondary | ICD-10-CM | POA: Diagnosis not present

## 2020-07-05 DIAGNOSIS — D649 Anemia, unspecified: Secondary | ICD-10-CM | POA: Diagnosis not present

## 2020-07-05 DIAGNOSIS — Z9981 Dependence on supplemental oxygen: Secondary | ICD-10-CM | POA: Diagnosis not present

## 2020-07-05 DIAGNOSIS — N179 Acute kidney failure, unspecified: Secondary | ICD-10-CM | POA: Diagnosis not present

## 2020-07-05 DIAGNOSIS — I10 Essential (primary) hypertension: Secondary | ICD-10-CM | POA: Diagnosis not present

## 2020-07-06 ENCOUNTER — Emergency Department (HOSPITAL_COMMUNITY): Payer: Medicare Other

## 2020-07-06 ENCOUNTER — Encounter (HOSPITAL_COMMUNITY): Payer: Self-pay

## 2020-07-06 ENCOUNTER — Other Ambulatory Visit: Payer: Self-pay

## 2020-07-06 ENCOUNTER — Inpatient Hospital Stay (HOSPITAL_COMMUNITY)
Admission: EM | Admit: 2020-07-06 | Discharge: 2020-07-11 | DRG: 871 | Disposition: A | Discharge status: Skilled Nursing Facility | Payer: Medicare Other | Attending: Internal Medicine | Admitting: Internal Medicine

## 2020-07-06 DIAGNOSIS — E871 Hypo-osmolality and hyponatremia: Secondary | ICD-10-CM | POA: Diagnosis not present

## 2020-07-06 DIAGNOSIS — N19 Unspecified kidney failure: Secondary | ICD-10-CM | POA: Diagnosis present

## 2020-07-06 DIAGNOSIS — E785 Hyperlipidemia, unspecified: Secondary | ICD-10-CM | POA: Diagnosis present

## 2020-07-06 DIAGNOSIS — N170 Acute kidney failure with tubular necrosis: Secondary | ICD-10-CM | POA: Diagnosis present

## 2020-07-06 DIAGNOSIS — Z9119 Patient's noncompliance with other medical treatment and regimen: Secondary | ICD-10-CM

## 2020-07-06 DIAGNOSIS — E1122 Type 2 diabetes mellitus with diabetic chronic kidney disease: Secondary | ICD-10-CM | POA: Diagnosis present

## 2020-07-06 DIAGNOSIS — E861 Hypovolemia: Secondary | ICD-10-CM | POA: Diagnosis present

## 2020-07-06 DIAGNOSIS — E872 Acidosis, unspecified: Secondary | ICD-10-CM | POA: Diagnosis present

## 2020-07-06 DIAGNOSIS — R2681 Unsteadiness on feet: Secondary | ICD-10-CM | POA: Diagnosis not present

## 2020-07-06 DIAGNOSIS — R5381 Other malaise: Secondary | ICD-10-CM | POA: Diagnosis not present

## 2020-07-06 DIAGNOSIS — J9610 Chronic respiratory failure, unspecified whether with hypoxia or hypercapnia: Secondary | ICD-10-CM | POA: Diagnosis not present

## 2020-07-06 DIAGNOSIS — Z7984 Long term (current) use of oral hypoglycemic drugs: Secondary | ICD-10-CM

## 2020-07-06 DIAGNOSIS — E1129 Type 2 diabetes mellitus with other diabetic kidney complication: Secondary | ICD-10-CM | POA: Diagnosis not present

## 2020-07-06 DIAGNOSIS — E875 Hyperkalemia: Secondary | ICD-10-CM | POA: Diagnosis not present

## 2020-07-06 DIAGNOSIS — R41841 Cognitive communication deficit: Secondary | ICD-10-CM | POA: Diagnosis not present

## 2020-07-06 DIAGNOSIS — D509 Iron deficiency anemia, unspecified: Secondary | ICD-10-CM | POA: Diagnosis present

## 2020-07-06 DIAGNOSIS — R262 Difficulty in walking, not elsewhere classified: Secondary | ICD-10-CM | POA: Diagnosis not present

## 2020-07-06 DIAGNOSIS — Z6841 Body Mass Index (BMI) 40.0 and over, adult: Secondary | ICD-10-CM | POA: Diagnosis not present

## 2020-07-06 DIAGNOSIS — J189 Pneumonia, unspecified organism: Secondary | ICD-10-CM | POA: Diagnosis present

## 2020-07-06 DIAGNOSIS — I130 Hypertensive heart and chronic kidney disease with heart failure and stage 1 through stage 4 chronic kidney disease, or unspecified chronic kidney disease: Secondary | ICD-10-CM | POA: Diagnosis present

## 2020-07-06 DIAGNOSIS — K5750 Diverticulosis of both small and large intestine without perforation or abscess without bleeding: Secondary | ICD-10-CM | POA: Diagnosis not present

## 2020-07-06 DIAGNOSIS — J9601 Acute respiratory failure with hypoxia: Secondary | ICD-10-CM | POA: Diagnosis not present

## 2020-07-06 DIAGNOSIS — J45909 Unspecified asthma, uncomplicated: Secondary | ICD-10-CM | POA: Diagnosis not present

## 2020-07-06 DIAGNOSIS — J9611 Chronic respiratory failure with hypoxia: Secondary | ICD-10-CM | POA: Diagnosis present

## 2020-07-06 DIAGNOSIS — M47819 Spondylosis without myelopathy or radiculopathy, site unspecified: Secondary | ICD-10-CM | POA: Diagnosis not present

## 2020-07-06 DIAGNOSIS — A411 Sepsis due to other specified staphylococcus: Principal | ICD-10-CM | POA: Diagnosis present

## 2020-07-06 DIAGNOSIS — E119 Type 2 diabetes mellitus without complications: Secondary | ICD-10-CM

## 2020-07-06 DIAGNOSIS — N182 Chronic kidney disease, stage 2 (mild): Secondary | ICD-10-CM | POA: Diagnosis not present

## 2020-07-06 DIAGNOSIS — T502X5A Adverse effect of carbonic-anhydrase inhibitors, benzothiadiazides and other diuretics, initial encounter: Secondary | ICD-10-CM | POA: Diagnosis not present

## 2020-07-06 DIAGNOSIS — N289 Disorder of kidney and ureter, unspecified: Secondary | ICD-10-CM | POA: Diagnosis not present

## 2020-07-06 DIAGNOSIS — N189 Chronic kidney disease, unspecified: Secondary | ICD-10-CM | POA: Diagnosis present

## 2020-07-06 DIAGNOSIS — I5032 Chronic diastolic (congestive) heart failure: Secondary | ICD-10-CM | POA: Diagnosis not present

## 2020-07-06 DIAGNOSIS — Z8673 Personal history of transient ischemic attack (TIA), and cerebral infarction without residual deficits: Secondary | ICD-10-CM | POA: Diagnosis not present

## 2020-07-06 DIAGNOSIS — K439 Ventral hernia without obstruction or gangrene: Secondary | ICD-10-CM | POA: Diagnosis not present

## 2020-07-06 DIAGNOSIS — I959 Hypotension, unspecified: Secondary | ICD-10-CM | POA: Diagnosis present

## 2020-07-06 DIAGNOSIS — Z9981 Dependence on supplemental oxygen: Secondary | ICD-10-CM

## 2020-07-06 DIAGNOSIS — I700 Atherosclerosis of aorta: Secondary | ICD-10-CM | POA: Diagnosis not present

## 2020-07-06 DIAGNOSIS — E1169 Type 2 diabetes mellitus with other specified complication: Secondary | ICD-10-CM | POA: Diagnosis not present

## 2020-07-06 DIAGNOSIS — R531 Weakness: Secondary | ICD-10-CM | POA: Diagnosis not present

## 2020-07-06 DIAGNOSIS — D631 Anemia in chronic kidney disease: Secondary | ICD-10-CM | POA: Diagnosis not present

## 2020-07-06 DIAGNOSIS — Z79899 Other long term (current) drug therapy: Secondary | ICD-10-CM

## 2020-07-06 DIAGNOSIS — M4854XD Collapsed vertebra, not elsewhere classified, thoracic region, subsequent encounter for fracture with routine healing: Secondary | ICD-10-CM | POA: Diagnosis present

## 2020-07-06 DIAGNOSIS — G4733 Obstructive sleep apnea (adult) (pediatric): Secondary | ICD-10-CM | POA: Diagnosis not present

## 2020-07-06 DIAGNOSIS — I10 Essential (primary) hypertension: Secondary | ICD-10-CM | POA: Diagnosis not present

## 2020-07-06 DIAGNOSIS — Z7982 Long term (current) use of aspirin: Secondary | ICD-10-CM

## 2020-07-06 DIAGNOSIS — Z20822 Contact with and (suspected) exposure to covid-19: Secondary | ICD-10-CM | POA: Diagnosis present

## 2020-07-06 DIAGNOSIS — R402411 Glasgow coma scale score 13-15, in the field [EMT or ambulance]: Secondary | ICD-10-CM | POA: Diagnosis not present

## 2020-07-06 DIAGNOSIS — Z743 Need for continuous supervision: Secondary | ICD-10-CM | POA: Diagnosis not present

## 2020-07-06 DIAGNOSIS — W19XXXD Unspecified fall, subsequent encounter: Secondary | ICD-10-CM | POA: Diagnosis present

## 2020-07-06 DIAGNOSIS — R7881 Bacteremia: Secondary | ICD-10-CM | POA: Diagnosis not present

## 2020-07-06 DIAGNOSIS — R251 Tremor, unspecified: Secondary | ICD-10-CM | POA: Diagnosis not present

## 2020-07-06 DIAGNOSIS — N179 Acute kidney failure, unspecified: Secondary | ICD-10-CM | POA: Diagnosis present

## 2020-07-06 DIAGNOSIS — E538 Deficiency of other specified B group vitamins: Secondary | ICD-10-CM | POA: Diagnosis present

## 2020-07-06 DIAGNOSIS — M6281 Muscle weakness (generalized): Secondary | ICD-10-CM | POA: Diagnosis not present

## 2020-07-06 DIAGNOSIS — R112 Nausea with vomiting, unspecified: Secondary | ICD-10-CM | POA: Diagnosis not present

## 2020-07-06 DIAGNOSIS — R279 Unspecified lack of coordination: Secondary | ICD-10-CM | POA: Diagnosis not present

## 2020-07-06 DIAGNOSIS — D649 Anemia, unspecified: Secondary | ICD-10-CM | POA: Diagnosis not present

## 2020-07-06 LAB — PROCALCITONIN: Procalcitonin: 0.27 ng/mL

## 2020-07-06 LAB — COMPREHENSIVE METABOLIC PANEL
ALT: 13 U/L (ref 0–44)
AST: 18 U/L (ref 15–41)
Albumin: 3.3 g/dL — ABNORMAL LOW (ref 3.5–5.0)
Alkaline Phosphatase: 74 U/L (ref 38–126)
Anion gap: 21 — ABNORMAL HIGH (ref 5–15)
BUN: 95 mg/dL — ABNORMAL HIGH (ref 8–23)
CO2: 17 mmol/L — ABNORMAL LOW (ref 22–32)
Calcium: 7.6 mg/dL — ABNORMAL LOW (ref 8.9–10.3)
Chloride: 86 mmol/L — ABNORMAL LOW (ref 98–111)
Creatinine, Ser: 6.26 mg/dL — ABNORMAL HIGH (ref 0.61–1.24)
GFR, Estimated: 9 mL/min — ABNORMAL LOW (ref >60)
Glucose, Bld: 94 mg/dL (ref 70–99)
Potassium: 5.3 mmol/L — ABNORMAL HIGH (ref 3.5–5.1)
Sodium: 124 mmol/L — ABNORMAL LOW (ref 135–145)
Total Bilirubin: 0.7 mg/dL (ref 0.3–1.2)
Total Protein: 7 g/dL (ref 6.5–8.1)

## 2020-07-06 LAB — URINALYSIS, ROUTINE W REFLEX MICROSCOPIC
Bilirubin Urine: NEGATIVE
Glucose, UA: NEGATIVE mg/dL
Hgb urine dipstick: NEGATIVE
Ketones, ur: NEGATIVE mg/dL
Leukocytes,Ua: NEGATIVE
Nitrite: NEGATIVE
Protein, ur: NEGATIVE mg/dL
Specific Gravity, Urine: 1.006 (ref 1.005–1.030)
pH: 5 (ref 5.0–8.0)

## 2020-07-06 LAB — RESP PANEL BY RT-PCR (FLU A&B, COVID) ARPGX2
Influenza A by PCR: NEGATIVE
Influenza B by PCR: NEGATIVE
SARS Coronavirus 2 by RT PCR: NEGATIVE

## 2020-07-06 LAB — BASIC METABOLIC PANEL
Anion gap: 18 — ABNORMAL HIGH (ref 5–15)
BUN: 96 mg/dL — ABNORMAL HIGH (ref 8–23)
CO2: 21 mmol/L — ABNORMAL LOW (ref 22–32)
Calcium: 7.3 mg/dL — ABNORMAL LOW (ref 8.9–10.3)
Chloride: 87 mmol/L — ABNORMAL LOW (ref 98–111)
Creatinine, Ser: 6.56 mg/dL — ABNORMAL HIGH (ref 0.61–1.24)
GFR, Estimated: 8 mL/min — ABNORMAL LOW (ref >60)
Glucose, Bld: 93 mg/dL (ref 70–99)
Potassium: 5 mmol/L (ref 3.5–5.1)
Sodium: 126 mmol/L — ABNORMAL LOW (ref 135–145)

## 2020-07-06 LAB — OSMOLALITY, URINE: Osmolality, Ur: 226 mOsm/kg — ABNORMAL LOW (ref 300–900)

## 2020-07-06 LAB — CBC WITH DIFFERENTIAL/PLATELET
Abs Immature Granulocytes: 0.04 10*3/uL (ref 0.00–0.07)
Basophils Absolute: 0.1 10*3/uL (ref 0.0–0.1)
Basophils Relative: 1 %
Eosinophils Absolute: 0.1 10*3/uL (ref 0.0–0.5)
Eosinophils Relative: 1 %
HCT: 32.4 % — ABNORMAL LOW (ref 39.0–52.0)
Hemoglobin: 10.1 g/dL — ABNORMAL LOW (ref 13.0–17.0)
Immature Granulocytes: 0 %
Lymphocytes Relative: 10 %
Lymphs Abs: 1.1 10*3/uL (ref 0.7–4.0)
MCH: 26.8 pg (ref 26.0–34.0)
MCHC: 31.2 g/dL (ref 30.0–36.0)
MCV: 85.9 fL (ref 80.0–100.0)
Monocytes Absolute: 0.9 10*3/uL (ref 0.1–1.0)
Monocytes Relative: 8 %
Neutro Abs: 8.3 10*3/uL — ABNORMAL HIGH (ref 1.7–7.7)
Neutrophils Relative %: 80 %
Platelets: 388 10*3/uL (ref 150–400)
RBC: 3.77 MIL/uL — ABNORMAL LOW (ref 4.22–5.81)
RDW: 13.7 % (ref 11.5–15.5)
WBC: 10.5 10*3/uL (ref 4.0–10.5)
nRBC: 0 % (ref 0.0–0.2)

## 2020-07-06 LAB — STREP PNEUMONIAE URINARY ANTIGEN: Strep Pneumo Urinary Antigen: NEGATIVE

## 2020-07-06 LAB — SODIUM, URINE, RANDOM: Sodium, Ur: 27 mmol/L

## 2020-07-06 LAB — MRSA PCR SCREENING: MRSA by PCR: NEGATIVE

## 2020-07-06 LAB — CREATININE, URINE, RANDOM: Creatinine, Urine: 48.64 mg/dL

## 2020-07-06 LAB — PHOSPHORUS: Phosphorus: 9.1 mg/dL — ABNORMAL HIGH (ref 2.5–4.6)

## 2020-07-06 LAB — MAGNESIUM: Magnesium: 1.1 mg/dL — ABNORMAL LOW (ref 1.7–2.4)

## 2020-07-06 MED ORDER — ATORVASTATIN CALCIUM 10 MG PO TABS
20.0000 mg | ORAL_TABLET | Freq: Every day | ORAL | Status: DC
  Administered 2020-07-06 – 2020-07-10 (×5): 20 mg via ORAL
  Filled 2020-07-06 (×5): qty 2

## 2020-07-06 MED ORDER — SODIUM CHLORIDE 0.9 % IV SOLN
500.0000 mg | Freq: Once | INTRAVENOUS | Status: AC
  Administered 2020-07-06: 500 mg via INTRAVENOUS
  Filled 2020-07-06: qty 500

## 2020-07-06 MED ORDER — SODIUM CHLORIDE 0.9 % IV SOLN
1.0000 g | Freq: Once | INTRAVENOUS | Status: AC
  Administered 2020-07-06: 1 g via INTRAVENOUS
  Filled 2020-07-06: qty 10

## 2020-07-06 MED ORDER — HEPARIN SODIUM (PORCINE) 5000 UNIT/ML IJ SOLN
5000.0000 [IU] | Freq: Three times a day (TID) | INTRAMUSCULAR | Status: DC
  Administered 2020-07-06 – 2020-07-11 (×16): 5000 [IU] via SUBCUTANEOUS
  Filled 2020-07-06 (×16): qty 1

## 2020-07-06 MED ORDER — SODIUM CHLORIDE 0.9 % IV SOLN
2.0000 g | INTRAVENOUS | Status: DC
  Administered 2020-07-07: 2 g via INTRAVENOUS
  Filled 2020-07-06: qty 0.2

## 2020-07-06 MED ORDER — ASPIRIN EC 81 MG PO TBEC
81.0000 mg | DELAYED_RELEASE_TABLET | Freq: Every day | ORAL | Status: DC
  Administered 2020-07-06 – 2020-07-11 (×6): 81 mg via ORAL
  Filled 2020-07-06 (×6): qty 1

## 2020-07-06 MED ORDER — SODIUM CHLORIDE 0.9 % IV SOLN: INTRAVENOUS | Status: AC

## 2020-07-06 MED ORDER — ONDANSETRON HCL 4 MG PO TABS: 4.0000 mg | ORAL_TABLET | Freq: Four times a day (QID) | ORAL | Status: DC | PRN

## 2020-07-06 MED ORDER — ACETAMINOPHEN 650 MG RE SUPP: 650.0000 mg | Freq: Four times a day (QID) | RECTAL | Status: DC | PRN

## 2020-07-06 MED ORDER — SODIUM CHLORIDE 0.9 % IV SOLN
500.0000 mg | INTRAVENOUS | Status: DC
  Administered 2020-07-07: 500 mg via INTRAVENOUS
  Filled 2020-07-06: qty 500

## 2020-07-06 MED ORDER — ONDANSETRON HCL 4 MG/2ML IJ SOLN: 4.0000 mg | Freq: Four times a day (QID) | INTRAMUSCULAR | Status: DC | PRN

## 2020-07-06 MED ORDER — ACETAMINOPHEN 325 MG PO TABS
650.0000 mg | ORAL_TABLET | Freq: Four times a day (QID) | ORAL | Status: DC | PRN
  Administered 2020-07-10: 650 mg via ORAL
  Filled 2020-07-06: qty 2

## 2020-07-06 NOTE — Progress Notes (Signed)
Pt currently refusing CPAP QHS, pt states that he does not wear one at home.

## 2020-07-06 NOTE — ED Notes (Signed)
Multiple unsuccessful IV attempts.

## 2020-07-06 NOTE — H&P (Signed)
History and Physical    Brian Silva [id number] DOB: 14-Dec-1940 DOA: 07/06/2020  PCP: Benito Mccreedy, MD   Patient coming from: SNF   Chief Complaint: Abnormal labs   HPI: Brian Silva is a 80 y.o. male with medical history significant for type 2 diabetes mellitus, hypertension, OSA, history of TIA, and mild chronic renal insufficiency, now presenting to the emergency department from his SNF for evaluation of abnormal blood work.  Patient reports that approximately 2 weeks ago he had 3 days of severe nausea, vomiting, and diarrhea along with some of the other residents at his nursing home.  He also had a cough and mild dyspnea at the time but feels that he has made a full recovery from all of that.  He has had a tremor for approximately 1 month that has been worsening.  He denies any fevers, chills, flank pain, dysuria, change in urination, new medications, or current shortness of breath. He denies any change in his chronic leg swelling.   ED Course: Upon arrival to the ED, patient is found to be afebrile, saturating well on room air, and with stable blood pressure.  EKG features sinus rhythm with first-degree AV nodal block.  Chest x-ray notable for patchy bibasilar consolidation.  CT stone study is negative for stones or hydroureteronephrosis bilaterally but notable for nonspecific perinephric and distal ureter stranding.  Chemistry panel features a sodium of 124, potassium 5.3, bicarbonate 17, BUN 95, and creatinine 6.26.  CBC was stable normocytic anemia.  COVID-19 PCR is negative.  Blood cultures were collected in the ED and the patient was treated with Rocephin and azithromycin.  Review of Systems:  All other systems reviewed and apart from HPI, are negative.  Past Medical History:  Diagnosis Date  . Asthma   . Chronic respiratory failure (HCC)    on 2L oxygen  . Hyperlipidemia   . Hypertension   . Obese   . OSA (obstructive sleep apnea)    Sleep study 08/2010 showed  mild to moderate obstructive sleep apnea/hypopnea syndrome. However, never initiated CPAP  . Venous stasis    Venous Statis Disease (03/28/2004)    History reviewed. No pertinent surgical history.  Social History:   reports that he has never smoked. He has never used smokeless tobacco. He reports current alcohol use. He reports that he does not use drugs.  No Known Allergies  History reviewed. No pertinent family history.   Prior to Admission medications   Medication Sig Start Date End Date Taking? Authorizing Provider  acetaminophen (TYLENOL) 500 MG tablet Take 2 tablets (1,000 mg total) by mouth every 8 (eight) hours. Patient not taking: Reported on 12/22/2019 09/14/19   Nita Sells, MD  albuterol (PROVENTIL HFA;VENTOLIN HFA) 108 (90 Base) MCG/ACT inhaler Inhale 2 puffs into the lungs every 6 (six) hours as needed. For shortness of breath 10/28/15   Theodis Blaze, MD  amLODipine (NORVASC) 10 MG tablet Take 10 mg by mouth daily.  09/03/17   [provider]  aspirin EC 81 MG tablet Take 81 mg by mouth daily.    [provider]  atorvastatin (LIPITOR) 20 MG tablet Take 20 mg by mouth at bedtime.      [provider]  Cholecalciferol (VITAMIN D3) 125 MCG (5000 UT) TBDP Take 5,000 Units by mouth daily.    [provider]  docusate sodium (COLACE) 100 MG capsule Take 100 mg by mouth 2 (two) times daily.    [provider]  DULoxetine (CYMBALTA)  30 MG capsule Take 30 mg by mouth daily.    [provider]  fluticasone (FLONASE) 50 MCG/ACT nasal spray Place 1 spray into both nostrils daily. 05/07/19   [provider]  furosemide (LASIX) 80 MG tablet Take 80 mg by mouth 2 (two) times daily.     [provider]  Infant Care Products [facility] EX) Apply 1 application topically in the morning and at bedtime. buttocks    [provider]  lisinopril (ZESTRIL) 5 MG tablet Take 1 tablet (5 mg total) by mouth daily.  09/14/19   Nita Sells, MD  metFORMIN (GLUCOPHAGE) 500 MG tablet Take 500 mg by mouth 2 (two) times daily with a meal.  07/14/17   [provider]  midodrine (PROAMATINE) 5 MG tablet Take 1 tablet (5 mg total) by mouth 3 (three) times daily with meals. 09/14/19   Nita Sells, MD  nystatin (NYSTATIN) powder Apply 1 application topically in the morning and at bedtime. Abdominal folds    [provider]  ondansetron (ZOFRAN) 4 MG tablet Take 4 mg by mouth every 8 (eight) hours as needed for nausea or vomiting.    [provider]  polyethylene glycol (MIRALAX / GLYCOLAX) 17 g packet Take 17 g by mouth daily as needed for mild constipation.    [provider]  pregabalin (LYRICA) 50 MG capsule Take 50 mg by mouth 3 (three) times daily.    [provider]  traMADol (ULTRAM) 50 MG tablet Take 1 tablet (50 mg total) by mouth every 8 (eight) hours as needed for moderate pain. 12/24/19   Oswald Hillock, MD    Physical Exam: Vitals:   07/06/20 0145 07/06/20 0230 07/06/20 0306 07/06/20 0345  BP:   127/69 129/62  Pulse: 77 77 75 74  Resp: '14 14 12 12  '$ Temp:      TempSrc:      SpO2: 98% 99% 99% 99%    Constitutional: NAD, calm  Eyes: PERTLA, lids and conjunctivae normal ENMT: Mucous membranes are moist. Posterior pharynx clear of any exudate or lesions.   Neck: normal, supple, no masses, no thyromegaly Respiratory: no wheezing, no crackles. No accessory muscle use.  Cardiovascular: S1 & S2 heard, regular rate and rhythm. Pretibial pitting edema.  Abdomen: No distension, no tenderness, soft. Bowel sounds active.  Musculoskeletal: no clubbing / cyanosis. No joint deformity upper and lower extremities.   Skin: no significant rashes, lesions, ulcers. Poor turgor. Neurologic: CN 2-12 grossly intact. Sensation intact. Moving all extremities. Resting tremor.  Psychiatric: Alert and oriented to person, place, and situation. Very pleasant and  cooperative.    Labs and Imaging on Admission: I have personally reviewed following labs and imaging studies  CBC: Recent Labs  Lab 07/06/20 0039  WBC 10.5  NEUTROABS 8.3*  HGB 10.1*  HCT 32.4*  MCV 85.9  PLT [id number]   Basic Metabolic Panel: Recent Labs  Lab 07/06/20 0039  NA 124*  K 5.3*  CL 86*  CO2 17*  GLUCOSE 94  BUN 95*  CREATININE 6.26*  CALCIUM 7.6*   GFR: CrCl cannot be calculated (Unknown ideal weight.). Liver Function Tests: Recent Labs  Lab 07/06/20 0039  AST 18  ALT 13  ALKPHOS 74  BILITOT 0.7  PROT 7.0  ALBUMIN 3.3*   No results for input(s): LIPASE, AMYLASE in the last 168 hours. No results for input(s): AMMONIA in the last 168 hours. Coagulation Profile: No results for input(s): INR, PROTIME in the last 168 hours. Cardiac  Enzymes: No results for input(s): CKTOTAL, CKMB, CKMBINDEX, TROPONINI in the last 168 hours. BNP (last 3 results) No results for input(s): PROBNP in the last 8760 hours. HbA1C: No results for input(s): HGBA1C in the last 72 hours. CBG: No results for input(s): GLUCAP in the last 168 hours. Lipid Profile: No results for input(s): CHOL, HDL, LDLCALC, TRIG, CHOLHDL, LDLDIRECT in the last 72 hours. Thyroid Function Tests: No results for input(s): TSH, T4TOTAL, FREET4, T3FREE, THYROIDAB in the last 72 hours. Anemia Panel: No results for input(s): VITAMINB12, FOLATE, FERRITIN, TIBC, IRON, RETICCTPCT in the last 72 hours. Urine analysis:    Component Value Date/Time   COLORURINE STRAW (A) 07/06/2020 0039   APPEARANCEUR CLEAR 07/06/2020 0039   LABSPEC 1.006 07/06/2020 0039   PHURINE 5.0 07/06/2020 0039   GLUCOSEU NEGATIVE 07/06/2020 0039   HGBUR NEGATIVE 07/06/2020 0039   HGBUR negative 01/04/2010 1409   BILIRUBINUR NEGATIVE 07/06/2020 0039   KETONESUR NEGATIVE 07/06/2020 0039   PROTEINUR NEGATIVE 07/06/2020 0039   UROBILINOGEN 0.2 03/30/2012 2227   NITRITE NEGATIVE 07/06/2020 0039   LEUKOCYTESUR NEGATIVE 07/06/2020 0039    Sepsis Labs: '@LABRCNTIP'$ (procalcitonin:4,lacticidven:4) ) Recent Results (from the past 240 hour(s))  Resp Panel by RT-PCR (Flu A&B, Covid) Nasopharyngeal Swab     Status: None   Collection Time: 07/06/20  3:57 AM   Specimen: Nasopharyngeal Swab; Nasopharyngeal(NP) swabs in vial transport medium  Result Value Ref Range Status   SARS Coronavirus 2 by RT PCR NEGATIVE NEGATIVE Final    Comment: (NOTE) SARS-CoV-2 target nucleic acids are NOT DETECTED.  The SARS-CoV-2 RNA is generally detectable in upper respiratory specimens during the acute phase of infection. The lowest concentration of SARS-CoV-2 viral copies this assay can detect is 138 copies/mL. A negative result does not preclude SARS-Cov-2 infection and should not be used as the sole basis for treatment or other patient management decisions. A negative result may occur with  improper specimen collection/handling, submission of specimen other than nasopharyngeal swab, presence of viral mutation(s) within the areas targeted by this assay, and inadequate number of viral copies(<138 copies/mL). A negative result must be combined with clinical observations, patient history, and epidemiological information. The expected result is Negative.  Fact Sheet for Patients:  EntrepreneurPulse.com.au  Fact Sheet for Healthcare Providers:  IncredibleEmployment.be  This test is no t yet approved or cleared by the [place] FDA and  has been authorized for detection and/or diagnosis of SARS-CoV-2 by FDA under an Emergency Use Authorization (EUA). This EUA will remain  in effect (meaning this test can be used) for the duration of the COVID-19 declaration under Section 564(b)(1) of the Act, 21 U.S.C.section 360bbb-3(b)(1), unless the authorization is terminated  or revoked sooner.       Influenza A by PCR NEGATIVE NEGATIVE Final   Influenza B by PCR NEGATIVE NEGATIVE Final    Comment: (NOTE) The  Xpert Xpress SARS-CoV-2/FLU/RSV plus assay is intended as an aid in the diagnosis of influenza from Nasopharyngeal swab specimens and should not be used as a sole basis for treatment. Nasal washings and aspirates are unacceptable for Xpert Xpress SARS-CoV-2/FLU/RSV testing.  Fact Sheet for Patients: EntrepreneurPulse.com.au  Fact Sheet for Healthcare Providers: IncredibleEmployment.be  This test is not yet approved or cleared by the [place] FDA and has been authorized for detection and/or diagnosis of SARS-CoV-2 by FDA under an Emergency Use Authorization (EUA). This EUA will remain in effect (meaning this test can be used) for the duration of the COVID-19 declaration under Section 564(b)(1) of  the Act, 21 U.S.C. section 360bbb-3(b)(1), unless the authorization is terminated or revoked.  Performed at [facility], [place] [address] [place], [place] [address]      Radiological Exams on Admission: DG Chest Port 1 View  Result Date: 07/06/2020 CLINICAL DATA:  Weakness, renal insufficiency EXAM: PORTABLE CHEST 1 VIEW COMPARISON:  12/22/2019 FINDINGS: Single frontal view of the chest demonstrates stable enlargement of the cardiac silhouette. There is patchy consolidation at the lung bases. On the right, linear consolidation favors subsegmental atelectasis or scarring. On the left, retrocardiac consolidation could reflect airspace disease or atelectasis. No effusion or pneumothorax. No acute bony abnormalities. IMPRESSION: 1. Patchy bibasilar consolidation, which may reflect atelectasis or airspace disease. Electronically Signed   By: Randa Ngo M.D.   On: 07/06/2020 01:18   CT RENAL STONE STUDY  Result Date: 07/06/2020 CLINICAL DATA:  Acute renal failure. Unable to void. Concern for stone. EXAM: CT ABDOMEN AND PELVIS WITHOUT CONTRAST TECHNIQUE: Multidetector CT imaging of the abdomen and pelvis was performed following the  standard protocol without IV contrast. COMPARISON:  CT abdomen pelvis 12/22/2019 FINDINGS: Lower chest: Mucous impaction within bilateral lower lobes. Bilateral lower lobe subsegmental atelectasis. Bronchiectasis at the left lower lobe. Coronary artery calcifications. Hepatobiliary: No focal liver abnormality. No gallstones, gallbladder wall thickening, or pericholecystic fluid. No biliary dilatation. Pancreas: No focal lesion. Normal pancreatic contour. No surrounding inflammatory changes. No main pancreatic ductal dilatation. Spleen: Punctate calcifications likely represent sequelae of prior granulomatous disease. Otherwise normal in size without focal abnormality. Adrenals/Urinary Tract: No adrenal nodule bilaterally. Persistent bilateral nonspecific perinephric stranding. Persistent nonspecific stranding along the distal ureters. No nephrolithiasis, no hydronephrosis, and no contour-deforming renal mass. No ureterolithiasis or hydroureter. The urinary bladder is unremarkable. Stomach/Bowel: Stomach is within normal limits. No evidence of bowel wall thickening or dilatation. Sigmoid diverticulosis. Appendix appears normal. Vascular/Lymphatic: No abdominal aorta or iliac aneurysm. Severe atherosclerotic plaque of the aorta and its branches. No abdominal, pelvic, or inguinal lymphadenopathy. Reproductive: Prostate is unremarkable. Other: No intraperitoneal free fluid. No intraperitoneal free gas. No organized fluid collection. Musculoskeletal: Small fat containing ventral wall hernia with an abdominal defect of 2.5 cm (2:67, 5:155). No suspicious lytic or blastic osseous lesions. No acute displaced fracture. Similar-appearing compression fracture of the T12 vertebral body. Multilevel degenerative changes of the spine. Similar-appearing grade 1 anterolisthesis of L5 on S1. IMPRESSION: 1. No nephroureterolithiasis or hydroureteronephrosis bilaterally. 2. Persistent bilateral nonspecific perinephric and distal  ureteral stranding. Correlate with urinalysis for infection. 3. Other imaging findings of potential clinical significance: Bilateral lower lobe mild bronchiectasis and mucous - superimposed post post infection/inflammation not excluded. Sigmoid diverticulosis with no acute diverticulitis. Small fat containing ventral hernia with no findings to suggest ischemia or associated bowel obstruction. Similar-appearing T12 compression fracture. Aortic Atherosclerosis (ICD10-I70.0). Electronically Signed   By: Iven Finn M.D.   On: 07/06/2020 03:41    EKG: Independently reviewed. Sinus rhythm, 1st degree AV block.   Assessment/Plan   1. Acute renal failure superimposed on CKD II; hyperkalemia; acidosis; uremia  - Presents for evaluation of renal failure on outpatient labs and is found to have BUN 95, SCr 6.26, potassium 5.3, and serum bicarb 17  - He had 3 days of N/V/D 2 weeks ago, continued to take Lasix and lisinopril, appears hypovolemic, no stones or hydro on CT  - Check FEUrea, hold Lasix and lisinopril, start NS infusion, renally-dose medications, follow serial chem panels    2. Hyponatremia  - Serum sodium is 124 in setting  of renal failure  - Hold Lasix, start NS infusion, monitor    3. Hypertension  - BP at goal, hold antihypertensives given concern for prerenal azotemia and treat as-needed only for now    4. Chronic diastolic CHF  - EF was preserved in May 2021  - Hold Lasix and lisinopril, follow weight and I/Os    5. History of TIA  - Continue ASA and statin    6. Pneumonia  - Question of pneumonia raised on imaging and antibiotics started in ED  - Patient reports cough 2 weeks ago that is improving, is not tachypneic or hypoxic, and does not have fever or leukocytosis  - Check strep pneumo and legionella antigens, check procalcitonin, continue antibiotics for now    7. OSA  - CPAP while sleeping    DVT prophylaxis: sq heparin  Code Status: Full. Patient reports he used to  be DNR recently changed his mind  Level of Care: Level of care: Telemetry Medical Family Communication: None present  Disposition Plan:  Patient is from: SNF  Anticipated d/c is to: SNF  Anticipated d/c date is: 07/12/20 Patient currently: Pending further eval of renal failure Consults called: none Admission status: Inpatient     Vianne Bulls, MD [facility]  07/06/2020, 6:27 AM

## 2020-07-06 NOTE — Consult Note (Signed)
[facility] Renal Consultation Note  Requesting MD:  Indication for Consultation: Acute kidney injury, maintenance euvolemia, assessment treatment of hyperkalemia, assessment of acid-base abnormalities  HPI: Brian Silva is a 80 y.o. male.  With a history of diabetes mellitus type 2 hypertension history of TIA history of mild chronic renal insufficiency appears that his creatinine 12/24/2019 was 1.3 mg/dL.  His last admission was 831-12/24/2019 with nausea vomiting abdominal pain and community-acquired pneumonia.  He was brought to the emergency room 07/06/2020 with a 2-week history of nausea and vomiting and diarrhea that has become progressively worse.  In the emergency room it was noted that his labs was severely abnormal with a BUN of 95 and a creatinine 6.26.  His vital signs showed that he was hypotensive with a blood pressure as low as 91/57.  CT scan abdomen and pelvis without contrast showed no evidence of hydronephrosis.  Urinalysis was bland with no evidence of any proteinuria.  Home medications: Amlodipine 10 mg daily vitamin D3 1 tablet daily Cymbalta 30 mg daily Lasix 80 mg twice daily lisinopril 5 mg daily Metformin 500 mg twice daily midodrine 5 mg 3 times daily Protonix 20 mg daily Lyrica 50 mg nightly.  Blood pressure 129/98 pulse 76 temperature 97.5 O2 sats 97% room air  IV Rocephin IV azithromycin  Sodium 126 potassium 5 chloride 87 CO2 21 BUN 96 creatinine 6.56 glucose 93 calcium 7.3 hemoglobin 10.1 liver enzymes within normal range.  Urinalysis bland.  Urine sodium 27.   Creatinine, Ser  Date/Time Value Ref Range Status  07/06/2020 07:50 AM 6.56 (H) 0.61 - 1.24 mg/dL Final  07/06/2020 12:39 AM 6.26 (H) 0.61 - 1.24 mg/dL Final  12/24/2019 01:26 PM 1.29 (H) 0.61 - 1.24 mg/dL Final  12/23/2019 05:11 AM 1.40 (H) 0.61 - 1.24 mg/dL Final  12/22/2019 02:44 PM 1.55 (H) 0.61 - 1.24 mg/dL Final  09/13/2019 06:49 AM 1.08 0.61 - 1.24 mg/dL Final  09/12/2019 08:56 AM  1.20 0.61 - 1.24 mg/dL Final  09/11/2019 06:01 AM 1.39 (H) 0.61 - 1.24 mg/dL Final  09/10/2019 05:24 PM 1.60 (H) 0.61 - 1.24 mg/dL Final  09/10/2019 05:10 PM 1.52 (H) 0.61 - 1.24 mg/dL Final  06/03/2019 12:15 AM 1.60 (H) 0.61 - 1.24 mg/dL Final  01/29/2019 02:48 PM 1.66 (H) 0.61 - 1.24 mg/dL Final  01/18/2018 05:01 AM 1.20 0.61 - 1.24 mg/dL Final  01/17/2018 03:31 AM 1.66 (H) 0.61 - 1.24 mg/dL Final  01/16/2018 10:57 PM 1.64 (H) 0.61 - 1.24 mg/dL Final  10/06/2017 04:46 PM 1.08 0.61 - 1.24 mg/dL Final  10/28/2015 02:54 AM 1.16 0.61 - 1.24 mg/dL Final  10/27/2015 04:00 AM 1.32 (H) 0.61 - 1.24 mg/dL Final  10/26/2015 03:28 AM 1.40 (H) 0.61 - 1.24 mg/dL Final  10/26/2015 03:26 AM 1.38 (H) 0.61 - 1.24 mg/dL Final  10/25/2015 04:22 AM 1.31 (H) 0.61 - 1.24 mg/dL Final  10/24/2015 11:29 PM 1.33 (H) 0.61 - 1.24 mg/dL Final  07/09/2013 09:20 PM 0.91 0.50 - 1.35 mg/dL Final  03/30/2012 11:07 PM 0.89 0.50 - 1.35 mg/dL Final  04/30/2011 03:20 PM 0.81 0.50 - 1.35 mg/dL Final  12/29/2010 02:00 PM 1.04 0.50 - 1.35 mg/dL Final  11/13/2010 06:20 AM 1.24 0.50 - 1.35 mg/dL Final  11/08/2010 12:14 AM 1.14 0.50 - 1.35 mg/dL Final  01/04/2010 09:54 PM 0.90 0.40 - 1.50 mg/dL Final  11/24/2009 06:30 AM 1.00 0.4 - 1.5 mg/dL Final  07/15/2009 12:00 AM 1.10 0.40 - 1.50 mg/dL Final  12/02/2008 10:30 PM 0.90  0.40 - 1.50 mg/dL Final  06/06/2008 05:10 AM 1.06 0.4 - 1.5 mg/dL Final  06/05/2008 01:17 AM 1.17 0.4 - 1.5 mg/dL Final  12/01/2007 09:21 PM 1.03 0.40 - 1.50 mg/dL Final  10/26/2007 09:03 PM 1.47  Final  03/31/2007 10:45 PM 1.03 0.40 - 1.50 mg/dL Final     PMHx:   Past Medical History:  Diagnosis Date   Asthma    Chronic respiratory failure ([place])    on 2L oxygen   Hyperlipidemia    Hypertension    Obese    OSA (obstructive sleep apnea)    Sleep study 08/2010 showed mild to moderate obstructive sleep apnea/hypopnea syndrome. However, never initiated CPAP   Venous stasis    Venous Statis  Disease (03/28/2004)    History reviewed. No pertinent surgical history.  Family Hx: History reviewed. No pertinent family history.  Social History:  reports that he has never smoked. He has never used smokeless tobacco. He reports current alcohol use. He reports that he does not use drugs.  Allergies: No Known Allergies  Medications: Prior to Admission medications   Medication Sig Start Date End Date Taking? Authorizing Provider  acetaminophen (TYLENOL) 500 MG tablet Take 2 tablets (1,000 mg total) by mouth every 8 (eight) hours. Patient not taking: Reported on 12/22/2019 09/14/19   Nita Sells, MD  albuterol (PROVENTIL HFA;VENTOLIN HFA) 108 (90 Base) MCG/ACT inhaler Inhale 2 puffs into the lungs every 6 (six) hours as needed. For shortness of breath 10/28/15   Theodis Blaze, MD  amLODipine (NORVASC) 10 MG tablet Take 10 mg by mouth daily.  09/03/17   [provider]  aspirin EC 81 MG tablet Take 81 mg by mouth daily.    [provider]  atorvastatin (LIPITOR) 20 MG tablet Take 20 mg by mouth at bedtime.      [provider]  Cholecalciferol (VITAMIN D3) 125 MCG (5000 UT) CAPS Take 1 capsule by mouth daily. 06/07/20   [provider]  docusate sodium (COLACE) 100 MG capsule Take 100 mg by mouth 2 (two) times daily.    [provider]  DULoxetine (CYMBALTA) 30 MG capsule Take 30 mg by mouth daily.    [provider]  fluticasone (FLONASE) 50 MCG/ACT nasal spray Place 1 spray into both nostrils daily. 05/07/19   [provider]  furosemide (LASIX) 80 MG tablet Take 80 mg by mouth 2 (two) times daily.     [provider]  [employer] [facility] EX) Apply 1 application topically in the morning and at bedtime. buttocks    [provider]  ipratropium-albuterol (DUONEB) 0.5-2.5 (3) MG/3ML SOLN Take 0.5-2.5 mLs by nebulization every 6 (six) hours as needed for shortness of breath or wheezing. 07/04/20    [provider]  lisinopril (ZESTRIL) 5 MG tablet Take 1 tablet (5 mg total) by mouth daily. 09/14/19   Nita Sells, MD  metFORMIN (GLUCOPHAGE) 500 MG tablet Take 500 mg by mouth 2 (two) times daily with a meal.  07/14/17   [provider]  midodrine (PROAMATINE) 5 MG tablet Take 1 tablet (5 mg total) by mouth 3 (three) times daily with meals. 09/14/19   Nita Sells, MD  nystatin (NYSTATIN) powder Apply 1 application topically in the morning and at bedtime. Abdominal folds    [provider]  ondansetron (ZOFRAN) 4 MG tablet Take 4 mg by mouth every 8 (eight) hours as needed for nausea or vomiting.    [provider]  pantoprazole ([place])  20 MG tablet Take 20 mg by mouth daily. 06/30/20   [provider]  polyethylene glycol (MIRALAX / GLYCOLAX) 17 g packet Take 17 g by mouth daily as needed for mild constipation.    [provider]  pregabalin (LYRICA) 50 MG capsule Take 50 mg by mouth 3 (three) times daily.    [provider]  Saccharomyces boulardii (PROBIOTIC) 250 MG CAPS Take 1 capsule by mouth 2 (two) times daily. 06/22/20   [provider]  traMADol (ULTRAM) 50 MG tablet Take 1 tablet (50 mg total) by mouth every 8 (eight) hours as needed for moderate pain. 12/24/19   Oswald Hillock, MD     Labs:  Results for orders placed or performed during the hospital encounter of 07/06/20 (from the past 48 hour(s))  CBC with Differential/Platelet     Status: Abnormal   Collection Time: 07/06/20 12:39 AM  Result Value Ref Range   WBC 10.5 4.0 - 10.5 K/uL   RBC 3.77 (L) 4.22 - 5.81 MIL/uL   Hemoglobin 10.1 (L) 13.0 - 17.0 g/dL   HCT 32.4 (L) 39.0 - 52.0 %   MCV 85.9 80.0 - 100.0 fL   MCH 26.8 26.0 - 34.0 pg   MCHC 31.2 30.0 - 36.0 g/dL   RDW 13.7 11.5 - 15.5 %   Platelets 388 150 - 400 K/uL   nRBC 0.0 0.0 - 0.2 %   Neutrophils Relative % 80 %   Neutro Abs 8.3 (H) 1.7 - 7.7 K/uL   Lymphocytes Relative 10 %    Lymphs Abs 1.1 0.7 - 4.0 K/uL   Monocytes Relative 8 %   Monocytes Absolute 0.9 0.1 - 1.0 K/uL   Eosinophils Relative 1 %   Eosinophils Absolute 0.1 0.0 - 0.5 K/uL   Basophils Relative 1 %   Basophils Absolute 0.1 0.0 - 0.1 K/uL   Immature Granulocytes 0 %   Abs Immature Granulocytes 0.04 0.00 - 0.07 K/uL    Comment: Performed at [facility], [place] [address], [place], [place] [address]  Comprehensive metabolic panel     Status: Abnormal   Collection Time: 07/06/20 12:39 AM  Result Value Ref Range   Sodium 124 (L) 135 - 145 mmol/L   Potassium 5.3 (H) 3.5 - 5.1 mmol/L   Chloride 86 (L) 98 - 111 mmol/L   CO2 17 (L) 22 - 32 mmol/L   Glucose, Bld 94 70 - 99 mg/dL    Comment: Glucose reference range applies only to samples taken after fasting for at least 8 hours.   BUN 95 (H) 8 - 23 mg/dL   Creatinine, Ser 6.26 (H) 0.61 - 1.24 mg/dL   Calcium 7.6 (L) 8.9 - 10.3 mg/dL   Total Protein 7.0 6.5 - 8.1 g/dL   Albumin 3.3 (L) 3.5 - 5.0 g/dL   AST 18 15 - 41 U/L   ALT 13 0 - 44 U/L   Alkaline Phosphatase 74 38 - 126 U/L   Total Bilirubin 0.7 0.3 - 1.2 mg/dL   GFR, Estimated 9 (L) >60 mL/min    Comment: (NOTE) Calculated using the CKD-EPI Creatinine Equation (2021)    Anion gap 21 (H) 5 - 15    Comment: Performed at [facility], [place] [address] [place],  [address]  Urinalysis, Routine w reflex microscopic     Status: Abnormal   Collection Time: 07/06/20 12:39 AM  Result Value Ref Range   Color, Urine STRAW (A) YELLOW   APPearance  CLEAR CLEAR   Specific Gravity, Urine 1.006 1.005 - 1.030   pH 5.0 5.0 - 8.0   Glucose, UA NEGATIVE NEGATIVE mg/dL   Hgb urine dipstick NEGATIVE NEGATIVE   Bilirubin Urine NEGATIVE NEGATIVE   Ketones, ur NEGATIVE NEGATIVE mg/dL   Protein, ur NEGATIVE NEGATIVE mg/dL   Nitrite NEGATIVE NEGATIVE   Leukocytes,Ua NEGATIVE NEGATIVE    Comment: Performed at [facility] [address],  [place], [place] [address]  Procalcitonin - Baseline     Status: None   Collection Time: 07/06/20  1:28 AM  Result Value Ref Range   Procalcitonin 0.27 ng/mL    Comment:        Interpretation: PCT (Procalcitonin) <= 0.5 ng/mL: Systemic infection (sepsis) is not likely. Local bacterial infection is possible. (NOTE)       Sepsis PCT Algorithm           Lower Respiratory Tract                                      Infection PCT Algorithm    ----------------------------     ----------------------------         PCT < 0.25 ng/mL                PCT < 0.10 ng/mL          Strongly encourage             Strongly discourage   discontinuation of antibiotics    initiation of antibiotics    ----------------------------     -----------------------------       PCT 0.25 - 0.50 ng/mL            PCT 0.10 - 0.25 ng/mL               OR       >80% decrease in PCT            Discourage initiation of                                            antibiotics      Encourage discontinuation           of antibiotics    ----------------------------     -----------------------------         PCT >= 0.50 ng/mL              PCT 0.26 - 0.50 ng/mL               AND        <80% decrease in PCT             Encourage initiation of                                             antibiotics       Encourage continuation           of antibiotics    ----------------------------     -----------------------------        PCT >= 0.50 ng/mL                  PCT > 0.50 ng/mL  AND         increase in PCT                  Strongly encourage                                      initiation of antibiotics    Strongly encourage escalation           of antibiotics                                     -----------------------------                                           PCT <= 0.25 ng/mL                                                 OR                                        > 80% decrease in PCT                                       Discontinue / Do not initiate                                             antibiotics  Performed at [facility] [address], [place], [place] [address]   Magnesium     Status: Abnormal   Collection Time: 07/06/20  1:28 AM  Result Value Ref Range   Magnesium 1.1 (L) 1.7 - 2.4 mg/dL    Comment: Performed at [facility], [place] [address], [place], [place] [address]  Phosphorus     Status: Abnormal   Collection Time: 07/06/20  1:28 AM  Result Value Ref Range   Phosphorus 9.1 (H) 2.5 - 4.6 mg/dL    Comment: Performed at [facility], [place] [address] [place], [place] [address]  Culture, blood (Routine X 2) w Reflex to ID Panel     Status: None (Preliminary result)   Collection Time: 07/06/20  3:55 AM   Specimen: BLOOD  Result Value Ref Range   Specimen Description      BLOOD BLOOD LEFT FOREARM Performed at [facility] [address], [place], [place] [address]    Special Requests      BOTTLES DRAWN AEROBIC AND ANAEROBIC Blood Culture adequate volume Performed at [facility] [address] [place], [place] [address]    Culture      NO GROWTH < 12 HOURS Performed at [facility] [address] [place], [place] [address]    Report Status PENDING   Resp Panel by RT-PCR (Flu A&B, Covid) Nasopharyngeal Swab  Status: None   Collection Time: 07/06/20  3:57 AM   Specimen: Nasopharyngeal Swab; Nasopharyngeal(NP) swabs in vial transport medium  Result Value Ref Range   SARS Coronavirus 2 by RT PCR NEGATIVE NEGATIVE    Comment: (NOTE) SARS-CoV-2 target nucleic acids are NOT DETECTED.  The SARS-CoV-2 RNA is generally detectable in upper respiratory specimens during the acute phase of infection. The lowest concentration of SARS-CoV-2 viral copies this assay can detect is 138 copies/mL. A negative result does not preclude SARS-Cov-2 infection and should not be used as the  sole basis for treatment or other patient management decisions. A negative result may occur with  improper specimen collection/handling, submission of specimen other than nasopharyngeal swab, presence of viral mutation(s) within the areas targeted by this assay, and inadequate number of viral copies(<138 copies/mL). A negative result must be combined with clinical observations, patient history, and epidemiological information. The expected result is Negative.  Fact Sheet for Patients:  EntrepreneurPulse.com.au  Fact Sheet for Healthcare Providers:  IncredibleEmployment.be  This test is no t yet approved or cleared by the [place] FDA and  has been authorized for detection and/or diagnosis of SARS-CoV-2 by FDA under an Emergency Use Authorization (EUA). This EUA will remain  in effect (meaning this test can be used) for the duration of the COVID-19 declaration under Section 564(b)(1) of the Act, 21 U.S.C.section 360bbb-3(b)(1), unless the authorization is terminated  or revoked sooner.       Influenza A by PCR NEGATIVE NEGATIVE   Influenza B by PCR NEGATIVE NEGATIVE    Comment: (NOTE) The Xpert Xpress SARS-CoV-2/FLU/RSV plus assay is intended as an aid in the diagnosis of influenza from Nasopharyngeal swab specimens and should not be used as a sole basis for treatment. Nasal washings and aspirates are unacceptable for Xpert Xpress SARS-CoV-2/FLU/RSV testing.  Fact Sheet for Patients: EntrepreneurPulse.com.au  Fact Sheet for Healthcare Providers: IncredibleEmployment.be  This test is not yet approved or cleared by the [place] FDA and has been authorized for detection and/or diagnosis of SARS-CoV-2 by FDA under an Emergency Use Authorization (EUA). This EUA will remain in effect (meaning this test can be used) for the duration of the COVID-19 declaration under Section 564(b)(1) of the Act, 21  U.S.C. section 360bbb-3(b)(1), unless the authorization is terminated or revoked.  Performed at [facility], [place] [address], [place], [place] [address]   Culture, blood (Routine X 2) w Reflex to ID Panel     Status: None (Preliminary result)   Collection Time: 07/06/20  4:00 AM   Specimen: BLOOD  Result Value Ref Range   Specimen Description      BLOOD BLOOD RIGHT FOREARM Performed at [facility] [address], [place], [place] [address]    Special Requests      BOTTLES DRAWN AEROBIC AND ANAEROBIC Blood Culture adequate volume Performed at [facility] [address] [place] [address] [place], [place] [address]    Culture      NO GROWTH < 12 HOURS Performed at [facility] [address], [place], [place] [address]    Report Status PENDING   Basic metabolic panel     Status: Abnormal   Collection Time: 07/06/20  7:50 AM  Result Value Ref Range   Sodium 126 (L) 135 - 145 mmol/L   Potassium 5.0 3.5 - 5.1 mmol/L   Chloride 87 (L) 98 - 111 mmol/L   CO2 21 (L) 22 - 32 mmol/L   Glucose, Bld 93  70 - 99 mg/dL    Comment: Glucose reference range applies only to samples taken after fasting for at least 8 hours.   BUN 96 (H) 8 - 23 mg/dL   Creatinine, Ser 6.56 (H) 0.61 - 1.24 mg/dL   Calcium 7.3 (L) 8.9 - 10.3 mg/dL   GFR, Estimated 8 (L) >60 mL/min    Comment: (NOTE) Calculated using the CKD-EPI Creatinine Equation (2021)    Anion gap 18 (H) 5 - 15    Comment: Performed at [facility], [place] [address] [place], [place] [address]  Strep pneumoniae urinary antigen     Status: None   Collection Time: 07/06/20  7:54 AM  Result Value Ref Range   Strep Pneumo Urinary Antigen NEGATIVE NEGATIVE    Comment:        Infection due to S. pneumoniae cannot be absolutely ruled out since the antigen present may be below the detection limit of the test. Performed at [facility], [place] [address], [place], [place]  [address]   Sodium, urine, random     Status: None   Collection Time: 07/06/20  7:54 AM  Result Value Ref Range   Sodium, Ur 27 mmol/L    Comment: Performed at [facility] [place] [address] [place], [place] [address]  Creatinine, urine, random     Status: None   Collection Time: 07/06/20  7:54 AM  Result Value Ref Range   Creatinine, Urine 48.64 mg/dL    Comment: Performed at [facility], [place] [address], [place], [place] [address]     ROS:  General denies fever sweats chills Eyes no visual complaints Ears nose mouth throat no hearing loss epistaxis or sore throat Cardiovascular no anginal chest pain orthopnea PND palpitations blackout spells ankle leg swelling Respiratory system denies cough wheeze hemoptysis Abdominal system as per HPI Urogenital no urgency frequency or dysuria Neurologic no stroke seizures numbness tingling or weakness in upper or lower extremities Dermatologic no skin rash or itching Endocrine history of diabetes   Physical Exam: Vitals:   07/06/20 0716 07/06/20 0753  BP: (!) 118/33 130/63  Pulse: 73 78  Resp: 13 16  Temp: (!) 97.5 F (36.4 C)   SpO2: 96% 98%     General: Alert pleasant gentleman nondistressed HEENT: Moist mucous membranes Eyes: Pupils round equal reactive Neck: Neck supple JVP not elevated Heart: Regular rate and rhythm with no murmurs rubs or gallops Lungs: Clear to auscultation Abdomen: Soft nontender bowel sounds present Extremities: No cyanosis clubbing or edema Skin: No ecchymosis or skin lesions Neuro: Grossly intact  Assessment/Plan: 1. Acute renal injury.  His baseline serum creatinine is within normal range as of September 2021.  Patient was using the ACE inhibitor lisinopril as well as Lasix in the setting of volume depletion and diarrhea.  Urine sediment is bland no evidence of acute GN or acute interstitial nephritis.  CT scan does not reveal any evidence of obstruction.  This looks  like hemodynamically mediated hypotensive episode leading to acute kidney injury I would suspect this is acute tubular necrosis.  Daily renal panel and avoid nephrotoxins 2. Hypertension/volume  -would continue volume resuscitation with normal saline at 125 cc an hour.  Would avoid ACE inhibitors in this patient. 3.  Hyponatremia with think this is hypovolemic hyponatremia we will continue to follow urine sodium.  Will check urine osmolality 4.  Hyperkalemia avoid potassium supplementation including lactated Ringer's. 5.  Antibiotics continue Rocephin and azithromycin. 6.  Diabetes mellitus would avoid Metformin in this patient   Sherril Croon 07/06/2020, 9:33 AM

## 2020-07-06 NOTE — ED Triage Notes (Signed)
Pt arrives EMS from [facility] after abnormal BUN (91) and Cr (5.81). Pt  Has no complaints.

## 2020-07-06 NOTE — ED Provider Notes (Signed)
[place] DEPT Provider Note   CSN: [id number] Arrival date & time: 07/06/20  0029     History Chief Complaint  Patient presents with  . Abnormal Lab    Brian Silva is a 80 y.o. male.  Patient sent to the emergency department from [facility].  Patient has been feeling shaky for the last week or so.  He had lab work performed at the nursing home which revealed acute kidney injury.  He was sent to the ER for further evaluation.  He has not having any pain.  He has not had any fever.  Denies nausea, vomiting diarrhea.  He reports that he has been eating and drinking normally, passing normal amounts of urine.        Past Medical History:  Diagnosis Date  . Asthma   . Chronic respiratory failure (HCC)    on 2L oxygen  . Hyperlipidemia   . Hypertension   . Obese   . OSA (obstructive sleep apnea)    Sleep study 08/2010 showed mild to moderate obstructive sleep apnea/hypopnea syndrome. However, never initiated CPAP  . Venous stasis    Venous Statis Disease (03/28/2004)    Patient Active Problem List   Diagnosis Date Noted  . HCAP (healthcare-associated pneumonia) 12/23/2019  . CAP (community acquired pneumonia) 12/22/2019  . Compression fracture of body of thoracic vertebra ([place]) 09/10/2019  . Fall at home, initial encounter 09/10/2019  . AKI (acute kidney injury) ([place]) 01/17/2018  . Syncope, vasovagal 01/17/2018  . Diabetes mellitus type II, non insulin dependent ([place]) 01/17/2018  . Hyponatremia 01/17/2018  . Syncope 01/17/2018  . Depression 10/25/2015  . Hyperlipidemia 10/25/2015  . TINEA PEDIS 01/04/2010  . TINEA VERSICOLOR 01/04/2010  . URI 01/04/2010  . HYPERGLYCEMIA 12/02/2008  . ABDOMINAL WALL HERNIA 12/01/2007  . OBESITY, MORBID 04/04/2007  . SLEEP APNEA, OBSTRUCTIVE 04/04/2007  . LOW BACK PAIN, CHRONIC 12/07/2004  . HYPERLIPIDEMIA 08/12/2002  . ALLERGIC RHINITIS, SEASONAL 08/12/2002  . Essential hypertension  04/01/2000  . Asthma 10/11/1997  . SUPERFICIAL PHLEBITIS 05/06/1990    History reviewed. No pertinent surgical history.     No family history on file.  Social History   Tobacco Use  . Smoking status: Never Smoker  . Smokeless tobacco: Never Used  Vaping Use  . Vaping Use: Never used  Substance Use Topics  . Alcohol use: Yes    Comment: occasionally   . Drug use: Never    Home Medications Prior to Admission medications   Medication Sig Start Date End Date Taking? Authorizing Provider  acetaminophen (TYLENOL) 500 MG tablet Take 2 tablets (1,000 mg total) by mouth every 8 (eight) hours. Patient not taking: Reported on 12/22/2019 09/14/19   Nita Sells, MD  albuterol (PROVENTIL HFA;VENTOLIN HFA) 108 (90 Base) MCG/ACT inhaler Inhale 2 puffs into the lungs every 6 (six) hours as needed. For shortness of breath 10/28/15   Theodis Blaze, MD  amLODipine (NORVASC) 10 MG tablet Take 10 mg by mouth daily.  09/03/17   [provider]  aspirin EC 81 MG tablet Take 81 mg by mouth daily.    [provider]  atorvastatin (LIPITOR) 20 MG tablet Take 20 mg by mouth at bedtime.      [provider]  Cholecalciferol (VITAMIN D3) 125 MCG (5000 UT) TBDP Take 5,000 Units by mouth daily.    [provider]  docusate sodium (COLACE) 100 MG capsule Take 100 mg by mouth 2 (two) times daily.  [provider]  DULoxetine (CYMBALTA) 30 MG capsule Take 30 mg by mouth daily.    [provider]  fluticasone (FLONASE) 50 MCG/ACT nasal spray Place 1 spray into both nostrils daily. 05/07/19   [provider]  furosemide (LASIX) 80 MG tablet Take 80 mg by mouth 2 (two) times daily.     [provider]  Infant [employer] [facility] EX) Apply 1 application topically in the morning and at bedtime. buttocks    [provider]  lisinopril (ZESTRIL) 5 MG tablet Take 1 tablet (5 mg total) by mouth daily. 09/14/19   Nita Sells, MD  metFORMIN (GLUCOPHAGE) 500 MG tablet Take 500 mg by mouth 2 (two) times daily with a meal.  07/14/17   [provider]  midodrine (PROAMATINE) 5 MG tablet Take 1 tablet (5 mg total) by mouth 3 (three) times daily with meals. 09/14/19   Nita Sells, MD  nystatin (NYSTATIN) powder Apply 1 application topically in the morning and at bedtime. Abdominal folds    [provider]  ondansetron (ZOFRAN) 4 MG tablet Take 4 mg by mouth every 8 (eight) hours as needed for nausea or vomiting.    [provider]  polyethylene glycol (MIRALAX / GLYCOLAX) 17 g packet Take 17 g by mouth daily as needed for mild constipation.    [provider]  pregabalin (LYRICA) 50 MG capsule Take 50 mg by mouth 3 (three) times daily.    [provider]  traMADol (ULTRAM) 50 MG tablet Take 1 tablet (50 mg total) by mouth every 8 (eight) hours as needed for moderate pain. 12/24/19   Oswald Hillock, MD    Allergies    Patient has no known allergies.  Review of Systems   Review of Systems  Constitutional: Positive for fatigue.  Neurological: Positive for tremors.  All other systems reviewed and are negative.   Physical Exam Updated Vital Signs BP 127/69   Pulse 75   Temp 98.3 F (36.8 C) (Oral)   Resp 12   SpO2 99%   Physical Exam Vitals and nursing note reviewed.  Constitutional:      General: He is not in acute distress.    Appearance: Normal appearance. He is well-developed. He is obese.  HENT:     Head: Normocephalic and atraumatic.     Right Ear: Hearing normal.     Left Ear: Hearing normal.     Nose: Nose normal.  Eyes:     Conjunctiva/sclera: Conjunctivae normal.     Pupils: Pupils are equal, round, and reactive to light.  Cardiovascular:     Rate and Rhythm: Regular rhythm.     Heart sounds: S1 normal and S2 normal. No murmur heard. No friction rub. No gallop.   Pulmonary:     Effort: Pulmonary effort is normal. No respiratory  distress.     Breath sounds: Normal breath sounds.  Chest:     Chest wall: No tenderness.  Abdominal:     General: Bowel sounds are normal.     Palpations: Abdomen is soft.     Tenderness: There is no abdominal tenderness. There is no guarding or rebound. Negative signs include Murphy's sign and McBurney's sign.     Hernia: No hernia is present.  Musculoskeletal:        General: Normal range of motion.     Cervical back: Normal range of motion and neck supple.  Skin:    General: Skin is warm and dry.  Findings: No rash.  Neurological:     Mental Status: He is alert and oriented to person, place, and time.     GCS: GCS eye subscore is 4. GCS verbal subscore is 5. GCS motor subscore is 6.     Cranial Nerves: No cranial nerve deficit.     Sensory: No sensory deficit.     Coordination: Coordination normal.  Psychiatric:        Speech: Speech normal.        Behavior: Behavior normal.        Thought Content: Thought content normal.     ED Results / Procedures / Treatments   Labs (all labs ordered are listed, but only abnormal results are displayed) Labs Reviewed  CBC WITH DIFFERENTIAL/PLATELET - Abnormal; Notable for the following components:      Result Value   RBC 3.77 (*)    Hemoglobin 10.1 (*)    HCT 32.4 (*)    Neutro Abs 8.3 (*)    All other components within normal limits  COMPREHENSIVE METABOLIC PANEL - Abnormal; Notable for the following components:   Sodium 124 (*)    Potassium 5.3 (*)    Chloride 86 (*)    CO2 17 (*)    BUN 95 (*)    Creatinine, Ser 6.26 (*)    Calcium 7.6 (*)    Albumin 3.3 (*)    GFR, Estimated 9 (*)    Anion gap 21 (*)    All other components within normal limits  URINALYSIS, ROUTINE W REFLEX MICROSCOPIC - Abnormal; Notable for the following components:   Color, Urine STRAW (*)    All other components within normal limits  URINE CULTURE  CULTURE, BLOOD (ROUTINE X 2)  CULTURE, BLOOD (ROUTINE X 2)  RESP PANEL BY RT-PCR (FLU A&B,  COVID) ARPGX2    EKG EKG Interpretation  Date/Time:  Wednesday July 06 2020 01:42:49 EDT Ventricular Rate:  78 PR Interval:    QRS Duration: 98 QT Interval:  371 QTC Calculation: 423 R Axis:   -40 Text Interpretation: Sinus rhythm with first degree AV block Inferior infarct, old Consider anterior infarct No significant change since last tracing Confirmed by Orpah Greek [phone]) on 07/06/2020 1:51:57 AM   Radiology DG Chest Port 1 View  Result Date: 07/06/2020 CLINICAL DATA:  Weakness, renal insufficiency EXAM: PORTABLE CHEST 1 VIEW COMPARISON:  12/22/2019 FINDINGS: Single frontal view of the chest demonstrates stable enlargement of the cardiac silhouette. There is patchy consolidation at the lung bases. On the right, linear consolidation favors subsegmental atelectasis or scarring. On the left, retrocardiac consolidation could reflect airspace disease or atelectasis. No effusion or pneumothorax. No acute bony abnormalities. IMPRESSION: 1. Patchy bibasilar consolidation, which may reflect atelectasis or airspace disease. Electronically Signed   By: Randa Ngo M.D.   On: 07/06/2020 01:18   CT RENAL STONE STUDY  Result Date: 07/06/2020 CLINICAL DATA:  Acute renal failure. Unable to void. Concern for stone. EXAM: CT ABDOMEN AND PELVIS WITHOUT CONTRAST TECHNIQUE: Multidetector CT imaging of the abdomen and pelvis was performed following the standard protocol without IV contrast. COMPARISON:  CT abdomen pelvis 12/22/2019 FINDINGS: Lower chest: Mucous impaction within bilateral lower lobes. Bilateral lower lobe subsegmental atelectasis. Bronchiectasis at the left lower lobe. Coronary artery calcifications. Hepatobiliary: No focal liver abnormality. No gallstones, gallbladder wall thickening, or pericholecystic fluid. No biliary dilatation. Pancreas: No focal lesion. Normal pancreatic contour. No surrounding inflammatory changes. No main pancreatic ductal dilatation. Spleen: Punctate  calcifications likely represent sequelae  of prior granulomatous disease. Otherwise normal in size without focal abnormality. Adrenals/Urinary Tract: No adrenal nodule bilaterally. Persistent bilateral nonspecific perinephric stranding. Persistent nonspecific stranding along the distal ureters. No nephrolithiasis, no hydronephrosis, and no contour-deforming renal mass. No ureterolithiasis or hydroureter. The urinary bladder is unremarkable. Stomach/Bowel: Stomach is within normal limits. No evidence of bowel wall thickening or dilatation. Sigmoid diverticulosis. Appendix appears normal. Vascular/Lymphatic: No abdominal aorta or iliac aneurysm. Severe atherosclerotic plaque of the aorta and its branches. No abdominal, pelvic, or inguinal lymphadenopathy. Reproductive: Prostate is unremarkable. Other: No intraperitoneal free fluid. No intraperitoneal free gas. No organized fluid collection. Musculoskeletal: Small fat containing ventral wall hernia with an abdominal defect of 2.5 cm (2:67, 5:155). No suspicious lytic or blastic osseous lesions. No acute displaced fracture. Similar-appearing compression fracture of the T12 vertebral body. Multilevel degenerative changes of the spine. Similar-appearing grade 1 anterolisthesis of L5 on S1. IMPRESSION: 1. No nephroureterolithiasis or hydroureteronephrosis bilaterally. 2. Persistent bilateral nonspecific perinephric and distal ureteral stranding. Correlate with urinalysis for infection. 3. Other imaging findings of potential clinical significance: Bilateral lower lobe mild bronchiectasis and mucous - superimposed post post infection/inflammation not excluded. Sigmoid diverticulosis with no acute diverticulitis. Small fat containing ventral hernia with no findings to suggest ischemia or associated bowel obstruction. Similar-appearing T12 compression fracture. Aortic Atherosclerosis (ICD10-I70.0). Electronically Signed   By: Iven Finn M.D.   On: 07/06/2020 03:41     Procedures Procedures   Medications Ordered in ED Medications  cefTRIAXone (ROCEPHIN) 1 g in sodium chloride 0.9 % 100 mL IVPB (has no administration in time range)  azithromycin (ZITHROMAX) 500 mg in sodium chloride 0.9 % 250 mL IVPB (has no administration in time range)    ED Course  I have reviewed the triage vital signs and the nursing notes.  Pertinent labs & imaging results that were available during my care of the patient were reviewed by me and considered in my medical decision making (see chart for details).    MDM Rules/Calculators/A&P                          Patient was sent to the emergency department for evaluation of renal failure.  Patient without any known history of kidney disease was sent to the ER after routine labs revealed significant elevation of BUN and creatinine.  Patient reports that he has not been feeling well for approximately a week, has had intermittent tremors or shakes.  No fever at time of arrival to the emergency department.  Oxygen saturations are normal, all vital signs are unremarkable.  Patient is passing urine without difficulty.  Chest x-ray concerning for pneumonia.  CT abdomen and pelvis performed to evaluate for causes of renal failure.  No evidence of obstruction.  Patient does have inflammatory changes in the lower lung Karessa [place]orato, will empirically cover with antibiotics.  Covid testing pending.  Will require hospitalization for further management of acute kidney injury.  Final Clinical Impression(s) / ED Diagnoses Final diagnoses:  Acute kidney injury ([place])  Pneumonia of both lower lobes due to infectious organism    Rx / DC Orders ED Discharge Orders    None       Pollina, Gwenyth Allegra, MD 07/06/20 [phone]

## 2020-07-06 NOTE — ED Notes (Signed)
Pt attempted to urinate in urinal. Unable to void.

## 2020-07-06 NOTE — Progress Notes (Signed)
[facility] PROGRESS NOTE   Brian Silva [id number] DOB: Dec 19, 1940 DOA: 07/06/2020  PCP: Benito Mccreedy, MD  Brief History/Interval Summary: 80 y.o. male with medical history significant for type 2 diabetes mellitus, hypertension, OSA, history of TIA, and mild chronic renal insufficiency, presented to the emergency department from his SNF for evaluation of abnormal blood work.    About 2 weeks prior to admission patient was experiencing nausea vomiting and diarrhea.  That lasted about 3 days.  Blood work at the skilled nursing facility revealed worsening in his renal function and he was sent over to the ED.  Subsequently hospitalized.    Consultants: Nephrology  Procedures: None yet  Antibiotics: Anti-infectives (From admission, onward)   Start     Dose/Rate Route Frequency Ordered Stop   07/07/20 0400  cefTRIAXone (ROCEPHIN) 2 g in sodium chloride 0.9 % 100 mL IVPB        2 g 200 mL/hr over 30 Minutes Intravenous Every 24 hours 07/06/20 0627 07/11/20 0359   07/07/20 0400  azithromycin (ZITHROMAX) 500 mg in sodium chloride 0.9 % 250 mL IVPB        500 mg 250 mL/hr over 60 Minutes Intravenous Every 24 hours 07/06/20 0627 07/11/20 0359   07/06/20 0400  cefTRIAXone (ROCEPHIN) 1 g in sodium chloride 0.9 % 100 mL IVPB        1 g 200 mL/hr over 30 Minutes Intravenous  Once 07/06/20 0353 07/06/20 0447   07/06/20 0400  azithromycin (ZITHROMAX) 500 mg in sodium chloride 0.9 % 250 mL IVPB        500 mg 250 mL/hr over 60 Minutes Intravenous  Once 07/06/20 0353 07/06/20 [id number]      Subjective/Interval History: Patient denies any abdominal pain nausea vomiting currently.  No recent diarrhea.  Has passing urine overnight.     Assessment/Plan:  Acute kidney injury superimposed on chronic kidney disease stage II with mild hyperkalemia/metabolic acidosis Patient was significantly elevated BUN and creatinine of 35 and 6.26 at admission.  Potassium was 5.3.  Baseline renal  function is close to 1.5.  This could have been triggered by his GI symptoms 2 weeks ago and then worsened in the setting of Lasix as well as ACE inhibitor.  UA noted to be bland.  Urine electrolyte studies noted.  Noncontrast CT scan did not show any hydronephrosis.  ACE inhibitor on hold along with the Lasix which is also on hold.  Patient being hydrated.  Patient is making urine.  Nephrology has been consulted to assist with management.  Could be ATN.  Since he is making urine we will continue to watch him here in the hospital.  At this time there is no clear indication for hemodialysis.  Hyponatremia Sodium level slightly better.  Continue IV fluids.  Recheck labs tomorrow.  Could be from hypovolemia.  Could also be from diuretics.  Essential hypertension Blood pressure is reasonably well controlled.  Monitor off of antihypertensives.  Chronic diastolic CHF Systolic function was preserved in 2021.  Holding furosemide and lisinopril.  Watch closely for signs of volume overload.  Normocytic anemia Likely anemia of chronic disease.  No evidence of overt blood loss.  TIA Continue aspirin and statin.  Questionable pneumonia Apparently has had a cough for 2 weeks or so.  Continue ceftriaxone and azithromycin for now.  Influenza and COVID-19 PCR negative.  Does not have any oxygen requirements currently.  Obstructive sleep apnea CPAP  Diabetes mellitus type 2 Noted to be on Metformin  at home which is currently on hold.  HbA1c was 5.7 in September.  Reasonable at this time to hold off on SSI.  Check glucose daily.  Recent back injury/T12 compression fracture Apparently had a fall with back injury.  That is the reason why he is in SNF for rehab.  Did not need to undergo any surgery per patient.  PT and OT evaluation.  We will also make TOC aware that he comes from a skilled nursing facility.  Morbid obesity Estimated body mass index is 45 kg/m as calculated from the following:   Height as  of 12/23/19: 6' (1.829 m).   Weight as of 12/23/19: 150.5 kg.   DVT Prophylaxis: Subcutaneous heparin Code Status: Full code Family Communication: Discussed with the patient Disposition Plan: Hopefully back to SNF when improved  Status is: Inpatient  Remains inpatient appropriate because:IV treatments appropriate due to intensity of illness or inability to take PO and Inpatient level of care appropriate due to severity of illness   Dispo: The patient is from: SNF              Anticipated d/c is to: SNF              Patient currently is not medically stable to d/c.   Difficult to place patient No      Medications:  Scheduled: . aspirin EC  81 mg Oral Daily  . atorvastatin  20 mg Oral QHS  . heparin  5,000 Units Subcutaneous Q8H   Continuous: . sodium chloride 125 mL/hr at 07/06/20 0656  . [START ON 07/07/2020] azithromycin    . [START ON 07/07/2020] cefTRIAXone (ROCEPHIN)  IV     [id number] **OR** acetaminophen, ondansetron **OR** ondansetron (ZOFRAN) IV   Objective:  Vital Signs  Vitals:   07/06/20 0345 07/06/20 0716 07/06/20 0753 07/06/20 1000  BP: 129/62 (!) 118/33 130/63 128/64  Pulse: 74 73 78 86  Resp: '12 13 16 '$ (!) 21  Temp:  (!) 97.5 F (36.4 C)    TempSrc:  Oral    SpO2: 99% 96% 98% 99%    Intake/Output Summary (Last 24 hours) at 07/06/2020 1133 Last data filed at 07/06/2020 [id number] Gross per 24 hour  Intake 350 ml  Output -  Net 350 ml   There were no vitals filed for this visit.  General appearance: Awake alert.  In no distress.  Obese Resp: Clear to auscultation bilaterally.  Normal effort Cardio: S1-S2 is normal regular.  No S3-S4.  No rubs murmurs or bruit GI: Abdomen is soft.  Nontender nondistended.  Bowel sounds are present normal.  No masses organomegaly Extremities: No edema.  Physical deconditioning is noted Neurologic: Alert and oriented x3.  No focal neurological deficits.    Lab Results:  Data Reviewed: I have personally reviewed  following labs and imaging studies  CBC: Recent Labs  Lab 07/06/20 0039  WBC 10.5  NEUTROABS 8.3*  HGB 10.1*  HCT 32.4*  MCV 85.9  PLT [id number]    Basic Metabolic Panel: Recent Labs  Lab 07/06/20 0039 07/06/20 0128 07/06/20 0750  NA 124*  --  126*  K 5.3*  --  5.0  CL 86*  --  87*  CO2 17*  --  21*  GLUCOSE 94  --  93  BUN 95*  --  96*  CREATININE 6.26*  --  6.56*  CALCIUM 7.6*  --  7.3*  MG  --  1.1*  --   PHOS  --  9.1*  --  GFR: CrCl cannot be calculated (Unknown ideal weight.).  Liver Function Tests: Recent Labs  Lab 07/06/20 0039  AST 18  ALT 13  ALKPHOS 74  BILITOT 0.7  PROT 7.0  ALBUMIN 3.3*      Recent Results (from the past 240 hour(s))  Culture, blood (Routine X 2) w Reflex to ID Panel     Status: None (Preliminary result)   Collection Time: 07/06/20  3:55 AM   Specimen: BLOOD  Result Value Ref Range Status   Specimen Description   Final    BLOOD BLOOD LEFT FOREARM Performed at [facility] [address], [place], [place] [address]    Special Requests   Final    BOTTLES DRAWN AEROBIC AND ANAEROBIC Blood Culture adequate volume Performed at [facility] [address], [place],  [address]    Culture   Final    NO GROWTH < 12 HOURS Performed at [facility] [address], [place],  [address]    Report Status PENDING  Incomplete  Resp Panel by RT-PCR (Flu A&B, Covid) Nasopharyngeal Swab     Status: None   Collection Time: 07/06/20  3:57 AM   Specimen: Nasopharyngeal Swab; Nasopharyngeal(NP) swabs in vial transport medium  Result Value Ref Range Status   SARS Coronavirus 2 by RT PCR NEGATIVE NEGATIVE Final    Comment: (NOTE) SARS-CoV-2 target nucleic acids are NOT DETECTED.  The SARS-CoV-2 RNA is generally detectable in upper respiratory specimens during the acute phase of infection. The lowest concentration of SARS-CoV-2 viral copies this assay can detect is 138 copies/mL.  A negative result does not preclude SARS-Cov-2 infection and should not be used as the sole basis for treatment or other patient management decisions. A negative result may occur with  improper specimen collection/handling, submission of specimen other than nasopharyngeal swab, presence of viral mutation(s) within the areas targeted by this assay, and inadequate number of viral copies(<138 copies/mL). A negative result must be combined with clinical observations, patient history, and epidemiological information. The expected result is Negative.  Fact Sheet for Patients:  EntrepreneurPulse.com.au  Fact Sheet for Healthcare Providers:  IncredibleEmployment.be  This test is no t yet approved or cleared by the [place] FDA and  has been authorized for detection and/or diagnosis of SARS-CoV-2 by FDA under an Emergency Use Authorization (EUA). This EUA will remain  in effect (meaning this test can be used) for the duration of the COVID-19 declaration under Section 564(b)(1) of the Act, 21 U.S.C.section 360bbb-3(b)(1), unless the authorization is terminated  or revoked sooner.       Influenza A by PCR NEGATIVE NEGATIVE Final   Influenza B by PCR NEGATIVE NEGATIVE Final    Comment: (NOTE) The Xpert Xpress SARS-CoV-2/FLU/RSV plus assay is intended as an aid in the diagnosis of influenza from Nasopharyngeal swab specimens and should not be used as a sole basis for treatment. Nasal washings and aspirates are unacceptable for Xpert Xpress SARS-CoV-2/FLU/RSV testing.  Fact Sheet for Patients: EntrepreneurPulse.com.au  Fact Sheet for Healthcare Providers: IncredibleEmployment.be  This test is not yet approved or cleared by the [place] FDA and has been authorized for detection and/or diagnosis of SARS-CoV-2 by FDA under an Emergency Use Authorization (EUA). This EUA will remain in effect (meaning this test  can be used) for the duration of the COVID-19 declaration under Section 564(b)(1) of the Act, 21 U.S.C. section 360bbb-3(b)(1), unless the authorization is terminated or revoked.  Performed at [facility],  [place] [address] [place], [place] [address]   Culture, blood (Routine X 2) w Reflex to ID Panel     Status: None (Preliminary result)   Collection Time: 07/06/20  4:00 AM   Specimen: BLOOD  Result Value Ref Range Status   Specimen Description   Final    BLOOD BLOOD RIGHT FOREARM Performed at [facility] [address] [place], [place] [address]    Special Requests   Final    BOTTLES DRAWN AEROBIC AND ANAEROBIC Blood Culture adequate volume Performed at [facility] [address], [place], [place] [address]    Culture   Final    NO GROWTH < 12 HOURS Performed at [facility] [address] [place], [place] [address]    Report Status PENDING  Incomplete      Radiology Studies: DG Chest Port 1 View  Result Date: 07/06/2020 CLINICAL DATA:  Weakness, renal insufficiency EXAM: PORTABLE CHEST 1 VIEW COMPARISON:  12/22/2019 FINDINGS: Single frontal view of the chest demonstrates stable enlargement of the cardiac silhouette. There is patchy consolidation at the lung bases. On the right, linear consolidation favors subsegmental atelectasis or scarring. On the left, retrocardiac consolidation could reflect airspace disease or atelectasis. No effusion or pneumothorax. No acute bony abnormalities. IMPRESSION: 1. Patchy bibasilar consolidation, which may reflect atelectasis or airspace disease. Electronically Signed   By: Randa Ngo M.D.   On: 07/06/2020 01:18   CT RENAL STONE STUDY  Result Date: 07/06/2020 CLINICAL DATA:  Acute renal failure. Unable to void. Concern for stone. EXAM: CT ABDOMEN AND PELVIS WITHOUT CONTRAST TECHNIQUE: Multidetector CT imaging of the abdomen and pelvis was performed following the standard  protocol without IV contrast. COMPARISON:  CT abdomen pelvis 12/22/2019 FINDINGS: Lower chest: Mucous impaction within bilateral lower lobes. Bilateral lower lobe subsegmental atelectasis. Bronchiectasis at the left lower lobe. Coronary artery calcifications. Hepatobiliary: No focal liver abnormality. No gallstones, gallbladder wall thickening, or pericholecystic fluid. No biliary dilatation. Pancreas: No focal lesion. Normal pancreatic contour. No surrounding inflammatory changes. No main pancreatic ductal dilatation. Spleen: Punctate calcifications likely represent sequelae of prior granulomatous disease. Otherwise normal in size without focal abnormality. Adrenals/Urinary Tract: No adrenal nodule bilaterally. Persistent bilateral nonspecific perinephric stranding. Persistent nonspecific stranding along the distal ureters. No nephrolithiasis, no hydronephrosis, and no contour-deforming renal mass. No ureterolithiasis or hydroureter. The urinary bladder is unremarkable. Stomach/Bowel: Stomach is within normal limits. No evidence of bowel wall thickening or dilatation. Sigmoid diverticulosis. Appendix appears normal. Vascular/Lymphatic: No abdominal aorta or iliac aneurysm. Severe atherosclerotic plaque of the aorta and its branches. No abdominal, pelvic, or inguinal lymphadenopathy. Reproductive: Prostate is unremarkable. Other: No intraperitoneal free fluid. No intraperitoneal free gas. No organized fluid collection. Musculoskeletal: Small fat containing ventral wall hernia with an abdominal defect of 2.5 cm (2:67, 5:155). No suspicious lytic or blastic osseous lesions. No acute displaced fracture. Similar-appearing compression fracture of the T12 vertebral body. Multilevel degenerative changes of the spine. Similar-appearing grade 1 anterolisthesis of L5 on S1. IMPRESSION: 1. No nephroureterolithiasis or hydroureteronephrosis bilaterally. 2. Persistent bilateral nonspecific perinephric and distal ureteral  stranding. Correlate with urinalysis for infection. 3. Other imaging findings of potential clinical significance: Bilateral lower lobe mild bronchiectasis and mucous - superimposed post post infection/inflammation not excluded. Sigmoid diverticulosis with no acute diverticulitis. Small fat containing ventral hernia with no findings to suggest ischemia or associated bowel obstruction. Similar-appearing T12 compression fracture. Aortic Atherosclerosis (ICD10-I70.0). Electronically Signed   By: Clelia Croft.D.  On: 07/06/2020 03:41       LOS: 0 days   [facility] Pager on [email or web address]  07/06/2020, 11:33 AM

## 2020-07-07 DIAGNOSIS — I5032 Chronic diastolic (congestive) heart failure: Secondary | ICD-10-CM

## 2020-07-07 DIAGNOSIS — R7881 Bacteremia: Secondary | ICD-10-CM | POA: Diagnosis not present

## 2020-07-07 DIAGNOSIS — J189 Pneumonia, unspecified organism: Secondary | ICD-10-CM | POA: Diagnosis not present

## 2020-07-07 DIAGNOSIS — N19 Unspecified kidney failure: Secondary | ICD-10-CM | POA: Diagnosis not present

## 2020-07-07 DIAGNOSIS — E871 Hypo-osmolality and hyponatremia: Secondary | ICD-10-CM

## 2020-07-07 DIAGNOSIS — I10 Essential (primary) hypertension: Secondary | ICD-10-CM

## 2020-07-07 LAB — URINE CULTURE: Culture: <10000 — AB

## 2020-07-07 LAB — RENAL FUNCTION PANEL
Albumin: 3 g/dL — ABNORMAL LOW (ref 3.5–5.0)
Anion gap: 15 (ref 5–15)
BUN: 96 mg/dL — ABNORMAL HIGH (ref 8–23)
CO2: 20 mmol/L — ABNORMAL LOW (ref 22–32)
Calcium: 6.9 mg/dL — ABNORMAL LOW (ref 8.9–10.3)
Chloride: 91 mmol/L — ABNORMAL LOW (ref 98–111)
Creatinine, Ser: 6.25 mg/dL — ABNORMAL HIGH (ref 0.61–1.24)
GFR, Estimated: 9 mL/min — ABNORMAL LOW (ref >60)
Glucose, Bld: 98 mg/dL (ref 70–99)
Phosphorus: 7.7 mg/dL — ABNORMAL HIGH (ref 2.5–4.6)
Potassium: 5 mmol/L (ref 3.5–5.1)
Sodium: 126 mmol/L — ABNORMAL LOW (ref 135–145)

## 2020-07-07 LAB — BLOOD CULTURE ID PANEL (REFLEXED) - BCID2

## 2020-07-07 LAB — CBC
HCT: 28.7 % — ABNORMAL LOW (ref 39.0–52.0)
Hemoglobin: 9.4 g/dL — ABNORMAL LOW (ref 13.0–17.0)
MCH: 26.8 pg (ref 26.0–34.0)
MCHC: 32.8 g/dL (ref 30.0–36.0)
MCV: 81.8 fL (ref 80.0–100.0)
Platelets: 444 10*3/uL — ABNORMAL HIGH (ref 150–400)
RBC: 3.51 MIL/uL — ABNORMAL LOW (ref 4.22–5.81)
RDW: 13.6 % (ref 11.5–15.5)
WBC: 7.5 10*3/uL (ref 4.0–10.5)
nRBC: 0 % (ref 0.0–0.2)

## 2020-07-07 LAB — UREA NITROGEN, URINE: Urea Nitrogen, Ur: 325 mg/dL

## 2020-07-07 LAB — PROCALCITONIN: Procalcitonin: 0.24 ng/mL

## 2020-07-07 MED ORDER — CEFAZOLIN SODIUM-DEXTROSE 2-4 GM/100ML-% IV SOLN
2.0000 g | Freq: Two times a day (BID) | INTRAVENOUS | Status: DC
  Administered 2020-07-07 – 2020-07-08 (×3): 2 g via INTRAVENOUS
  Filled 2020-07-07 (×3): qty 100

## 2020-07-07 NOTE — Progress Notes (Signed)
[facility] ROUNDING NOTE   Subjective:   Brief history: This is a 80 year old gentleman diabetes hypertension history of TIA mild chronic renal insufficiency with a creatinine of 1.392 2021.  His last admission was 12/22/2019-12/24/2019 with nausea vomiting and abdominal pain he also had community-acquired pneumonia at that time.  He presented to the emergency room 07/06/2020 with a 2-week history of nausea vomiting and diarrhea which has become progressively worse over the last several days.  He was hypotensive with a blood pressure of 91/57.  CT scan was done without contrast and did not show any evidence of hydronephrosis.  His urinalysis is bland.  His home medications include lisinopril 5 mg daily Metformin 500 mg twice daily.  Blood pressure 120/50 pulse 77 temperature 97.8 O2 sats 100%   Home medications: Amlodipine 10 mg daily vitamin D3 1 tablet daily Cymbalta 30 mg daily Lasix 80 mg twice daily lisinopril 5 mg daily Metformin 500 mg twice daily midodrine 5 mg 3 times daily Protonix 20 mg daily Lyrica 50 mg nightly.  IV Rocephin  Sodium 126 potassium 5 chloride 91 CO2 20 BUN 96 creatinine 6.25 glucose 98 calcium 6.9 albumin 3 hemoglobin 9.4   Objective:  Vital signs in last 24 hours:  Temp:  [97.4 F (36.3 C)-98.2 F (36.8 C)] 97.8 F (36.6 C) (03/17 0410) Pulse Rate:  [72-86] 77 (03/17 0410) Resp:  [14-21] 16 (03/17 0410) BP: (126-148)/(50-65) 128/50 (03/17 0410) SpO2:  [98 %-100 %] 100 % (03/17 0410) Weight:  [163.7 kg-165.1 kg] 165.1 kg (03/17 0500)  Weight change:  Filed Weights   07/06/20 1814 07/07/20 0500  Weight: (!) 163.7 kg (!) 165.1 kg    Intake/Output: I/O last 3 completed shifts: In: 2851.9 [P.O.:580; I.V.:1821.9; IV Piggyback:450] Out: 1890 [Urine:1890]   Intake/Output this shift:  No intake/output data recorded.  General: Alert pleasant gentleman nondistressed HEENT: Moist mucous membranes Eyes: Pupils round equal reactive Neck: Neck  supple JVP not elevated Heart: Regular rate and rhythm with no murmurs rubs or gallops Lungs: Clear to auscultation Abdomen: Soft nontender bowel sounds present Extremities: No cyanosis clubbing or edema Skin: No ecchymosis or skin lesions Neuro: Grossly intact   Basic Metabolic Panel: Recent Labs  Lab 07/06/20 0039 07/06/20 0128 07/06/20 0750 07/07/20 0326  NA 124*  --  126* 126*  K 5.3*  --  5.0 5.0  CL 86*  --  87* 91*  CO2 17*  --  21* 20*  GLUCOSE 94  --  93 98  BUN 95*  --  96* 96*  CREATININE 6.26*  --  6.56* 6.25*  CALCIUM 7.6*  --  7.3* 6.9*  MG  --  1.1*  --   --   PHOS  --  9.1*  --  7.7*    Liver Function Tests: Recent Labs  Lab 07/06/20 0039 07/07/20 0326  AST 18  --   ALT 13  --   ALKPHOS 74  --   BILITOT 0.7  --   PROT 7.0  --   ALBUMIN 3.3* 3.0*   No results for input(s): LIPASE, AMYLASE in the last 168 hours. No results for input(s): AMMONIA in the last 168 hours.  CBC: Recent Labs  Lab 07/06/20 0039 07/07/20 0326  WBC 10.5 7.5  NEUTROABS 8.3*  --   HGB 10.1* 9.4*  HCT 32.4* 28.7*  MCV 85.9 81.8  PLT 388 444*    Cardiac Enzymes: No results for input(s): CKTOTAL, CKMB, CKMBINDEX, TROPONINI in the last 168 hours.  BNP: Invalid input(s): POCBNP  CBG: No results for input(s): GLUCAP in the last 168 hours.  Microbiology: Results for orders placed or performed during the hospital encounter of 07/06/20  Culture, blood (Routine X 2) w Reflex to ID Panel     Status: None (Preliminary result)   Collection Time: 07/06/20  3:55 AM   Specimen: BLOOD  Result Value Ref Range Status   Specimen Description   Final    BLOOD BLOOD LEFT FOREARM Performed at [facility] [address] [place], [place] [address]    Special Requests   Final    BOTTLES DRAWN AEROBIC AND ANAEROBIC Blood Culture adequate volume Performed at [facility] [address], [place], [place] [address]    Culture  Setup Time    Final    AEROBIC BOTTLE ONLY GRAM POSITIVE COCCI CRITICAL VALUE NOTED.  VALUE IS CONSISTENT WITH PREVIOUSLY REPORTED AND CALLED VALUE.    Culture   Final    NO GROWTH < 12 HOURS Performed at [facility], [place] [address] [place], [place] [address]    Report Status PENDING  Incomplete  Resp Panel by RT-PCR (Flu A&B, Covid) Nasopharyngeal Swab     Status: None   Collection Time: 07/06/20  3:57 AM   Specimen: Nasopharyngeal Swab; Nasopharyngeal(NP) swabs in vial transport medium  Result Value Ref Range Status   SARS Coronavirus 2 by RT PCR NEGATIVE NEGATIVE Final    Comment: (NOTE) SARS-CoV-2 target nucleic acids are NOT DETECTED.  The SARS-CoV-2 RNA is generally detectable in upper respiratory specimens during the acute phase of infection. The lowest concentration of SARS-CoV-2 viral copies this assay can detect is 138 copies/mL. A negative result does not preclude SARS-Cov-2 infection and should not be used as the sole basis for treatment or other patient management decisions. A negative result may occur with  improper specimen collection/handling, submission of specimen other than nasopharyngeal swab, presence of viral mutation(s) within the areas targeted by this assay, and inadequate number of viral copies(<138 copies/mL). A negative result must be combined with clinical observations, patient history, and epidemiological information. The expected result is Negative.  Fact Sheet for Patients:  EntrepreneurPulse.com.au  Fact Sheet for Healthcare Providers:  IncredibleEmployment.be  This test is no t yet approved or cleared by the [place] FDA and  has been authorized for detection and/or diagnosis of SARS-CoV-2 by FDA under an Emergency Use Authorization (EUA). This EUA will remain  in effect (meaning this test can be used) for the duration of the COVID-19 declaration under Section 564(b)(1) of the Act, 21 U.S.C.section  360bbb-3(b)(1), unless the authorization is terminated  or revoked sooner.       Influenza A by PCR NEGATIVE NEGATIVE Final   Influenza B by PCR NEGATIVE NEGATIVE Final    Comment: (NOTE) The Xpert Xpress SARS-CoV-2/FLU/RSV plus assay is intended as an aid in the diagnosis of influenza from Nasopharyngeal swab specimens and should not be used as a sole basis for treatment. Nasal washings and aspirates are unacceptable for Xpert Xpress SARS-CoV-2/FLU/RSV testing.  Fact Sheet for Patients: EntrepreneurPulse.com.au  Fact Sheet for Healthcare Providers: IncredibleEmployment.be  This test is not yet approved or cleared by the [place] FDA and has been authorized for detection and/or diagnosis of SARS-CoV-2 by FDA under an Emergency Use Authorization (EUA). This EUA will remain in effect (meaning this test can be used) for the duration of the COVID-19 declaration under Section 564(b)(1) of the Act, 21 U.S.C. section 360bbb-3(b)(1),  unless the authorization is terminated or revoked.  Performed at [facility], [place] [address] [place], [place] [address]   Culture, blood (Routine X 2) w Reflex to ID Panel     Status: None (Preliminary result)   Collection Time: 07/06/20  4:00 AM   Specimen: BLOOD  Result Value Ref Range Status   Specimen Description   Final    BLOOD BLOOD RIGHT FOREARM Performed at [facility] [address] [place], [place] [address]    Special Requests   Final    BOTTLES DRAWN AEROBIC AND ANAEROBIC Blood Culture adequate volume Performed at [facility] [address] [place], [place] [address]    Culture  Setup Time   Final    IN BOTH AEROBIC AND ANAEROBIC BOTTLES GRAM POSITIVE COCCI Organism ID to follow    Culture   Final    NO GROWTH < 12 HOURS Performed at [facility], [place] [address], [place], [place] [address]    Report Status PENDING   Incomplete  MRSA PCR Screening     Status: None   Collection Time: 07/06/20  3:33 PM   Specimen: Nasal Mucosa; Nasopharyngeal  Result Value Ref Range Status   MRSA by PCR NEGATIVE NEGATIVE Final    Comment:        The GeneXpert MRSA Assay (FDA approved for NASAL specimens only), is one component of a comprehensive MRSA colonization surveillance program. It is not intended to diagnose MRSA infection nor to guide or monitor treatment for MRSA infections. Performed at [facility], [place] [address], [place], [place] [address]     Coagulation Studies: No results for input(s): LABPROT, INR in the last 72 hours.  Urinalysis: Recent Labs    07/06/20 0039  COLORURINE STRAW*  LABSPEC 1.006  PHURINE 5.0  GLUCOSEU NEGATIVE  HGBUR NEGATIVE  BILIRUBINUR NEGATIVE  KETONESUR NEGATIVE  PROTEINUR NEGATIVE  NITRITE NEGATIVE  LEUKOCYTESUR NEGATIVE      Imaging: DG Chest Port 1 View  Result Date: 07/06/2020 CLINICAL DATA:  Weakness, renal insufficiency EXAM: PORTABLE CHEST 1 VIEW COMPARISON:  12/22/2019 FINDINGS: Single frontal view of the chest demonstrates stable enlargement of the cardiac silhouette. There is patchy consolidation at the lung bases. On the right, linear consolidation favors subsegmental atelectasis or scarring. On the left, retrocardiac consolidation could reflect airspace disease or atelectasis. No effusion or pneumothorax. No acute bony abnormalities. IMPRESSION: 1. Patchy bibasilar consolidation, which may reflect atelectasis or airspace disease. Electronically Signed   By: Randa Ngo M.D.   On: 07/06/2020 01:18   CT RENAL STONE STUDY  Result Date: 07/06/2020 CLINICAL DATA:  Acute renal failure. Unable to void. Concern for stone. EXAM: CT ABDOMEN AND PELVIS WITHOUT CONTRAST TECHNIQUE: Multidetector CT imaging of the abdomen and pelvis was performed following the standard protocol without IV contrast. COMPARISON:  CT abdomen pelvis 12/22/2019  FINDINGS: Lower chest: Mucous impaction within bilateral lower lobes. Bilateral lower lobe subsegmental atelectasis. Bronchiectasis at the left lower lobe. Coronary artery calcifications. Hepatobiliary: No focal liver abnormality. No gallstones, gallbladder wall thickening, or pericholecystic fluid. No biliary dilatation. Pancreas: No focal lesion. Normal pancreatic contour. No surrounding inflammatory changes. No main pancreatic ductal dilatation. Spleen: Punctate calcifications likely represent sequelae of prior granulomatous disease. Otherwise normal in size without focal abnormality. Adrenals/Urinary Tract: No adrenal nodule bilaterally. Persistent bilateral nonspecific perinephric stranding. Persistent nonspecific stranding along the distal ureters. No nephrolithiasis, no hydronephrosis, and no contour-deforming renal mass. No ureterolithiasis or hydroureter.  The urinary bladder is unremarkable. Stomach/Bowel: Stomach is within normal limits. No evidence of bowel wall thickening or dilatation. Sigmoid diverticulosis. Appendix appears normal. Vascular/Lymphatic: No abdominal aorta or iliac aneurysm. Severe atherosclerotic plaque of the aorta and its branches. No abdominal, pelvic, or inguinal lymphadenopathy. Reproductive: Prostate is unremarkable. Other: No intraperitoneal free fluid. No intraperitoneal free gas. No organized fluid collection. Musculoskeletal: Small fat containing ventral wall hernia with an abdominal defect of 2.5 cm (2:67, 5:155). No suspicious lytic or blastic osseous lesions. No acute displaced fracture. Similar-appearing compression fracture of the T12 vertebral body. Multilevel degenerative changes of the spine. Similar-appearing grade 1 anterolisthesis of L5 on S1. IMPRESSION: 1. No nephroureterolithiasis or hydroureteronephrosis bilaterally. 2. Persistent bilateral nonspecific perinephric and distal ureteral stranding. Correlate with urinalysis for infection. 3. Other imaging findings  of potential clinical significance: Bilateral lower lobe mild bronchiectasis and mucous - superimposed post post infection/inflammation not excluded. Sigmoid diverticulosis with no acute diverticulitis. Small fat containing ventral hernia with no findings to suggest ischemia or associated bowel obstruction. Similar-appearing T12 compression fracture. Aortic Atherosclerosis (ICD10-I70.0). Electronically Signed   By: Iven Finn M.D.   On: 07/06/2020 03:41     Medications:   . sodium chloride 125 mL/hr at 07/07/20 0631  . azithromycin 500 mg (07/07/20 0631)  . cefTRIAXone (ROCEPHIN)  IV Stopped (07/07/20 0636)   . aspirin EC  81 mg Oral Daily  . atorvastatin  20 mg Oral QHS  . heparin  5,000 Units Subcutaneous Q8H   acetaminophen **OR** acetaminophen, ondansetron **OR** ondansetron (ZOFRAN) IV  Assessment/ Plan:  1. Acute renal injury.  His baseline serum creatinine is within normal range as of September 2021.  Patient was using the ACE inhibitor lisinopril as well as Lasix in the setting of volume depletion and diarrhea.  Urine sediment is bland no evidence of acute GN or acute interstitial nephritis.  CT scan does not reveal any evidence of obstruction.  This looks like hemodynamically mediated hypotensive episode leading to acute kidney injury I would suspect this is acute tubular necrosis.  Daily renal panel and avoid nephrotoxins 2. Hypertension/volume  -would continue volume resuscitation with normal saline at 125 cc an hour.  Would avoid ACE inhibitors in this patient.  Good urine output with decrease IV fluids to 75 cc an hour 3.  Hyponatremia   Urine [place] 226.  I believe this is more consistent with hypovolemia.  However patient was taking Lasix as an outpatient that could have confounded these results.  Serum sodium seems to be improving 4.  Hyperkalemia avoid potassium supplementation including lactated Ringer's. 5.  Antibiotics continue Rocephin and azithromycin. 6.  Diabetes  mellitus would avoid Metformin in this patient    LOS: [place] '@TODAY''@7'$ :57 AM

## 2020-07-07 NOTE — Consult Note (Signed)
[facility]  Total days of antibiotics 3/ cefazolin day 1               Reason for Consult: MSSE bacteremia    Referring Physician: [place] Pink  Principal Problem:   Acute renal failure superimposed on stage 2 chronic kidney disease ([place]) Active Problems:   SLEEP APNEA, OBSTRUCTIVE   Essential hypertension   AKI (acute kidney injury) ([place])   Diabetes mellitus type II, non insulin dependent ([place])   Hyponatremia   Pneumonia of both lower lobes due to infectious organism   Metabolic acidosis   Uremia   Hyperkalemia   Chronic diastolic CHF (congestive heart failure) (HCC)    HPI: Brian Silva is a 80 y.o. male with history of HTN, OSA, CKD 2-3, chronic diastolic CHF, 2LNC supp at baseline, was admitted on 3/15 for acute on chronic renal failure with Cr of 6.26. roughly 2 weeks prior to admit, He had 3 day history of N/V/D, along with other SNF residents along with cough and mild dyspnea (unclear if he tested negative for covid at that time) He also continued on his hypertension meds at that time. He has been afebrile, not hypoxic but was brought in due to abn in labs with BUN of 95 and Cr 6.26. He was ruled out for covid-19. Due to possible patchy bibasilar consolidation/left retrocardiac infiltrate on single view vs. atelectesis on cxr oer my read, he was started on ceftriaxone and azithromycin. He did not have fever nor leukocytosis.his infectious work up did isolate 3/4 blood cx with MSSE. His abtx changed to cefazolin. No other source of infection could be determined, no wounds nor cellulitis that could cause secondary bacteremia.  Past Medical History:  Diagnosis Date  . Asthma   . Chronic respiratory failure (HCC)    on 2L oxygen  . Hyperlipidemia   . Hypertension   . Obese   . OSA (obstructive sleep apnea)    Sleep study 08/2010 showed mild to moderate obstructive sleep apnea/hypopnea syndrome. However, never initiated CPAP  . Venous stasis     Venous Statis Disease (03/28/2004)    Allergies: No Known Allergies   MEDICATIONS: . aspirin EC  81 mg Oral Daily  . atorvastatin  20 mg Oral QHS  . heparin  5,000 Units Subcutaneous Q8H    Social History   Tobacco Use  . Smoking status: Never Smoker  . Smokeless tobacco: Never Used  Vaping Use  . Vaping Use: Never used  Substance Use Topics  . Alcohol use: Yes    Comment: occasionally   . Drug use: Never    History reviewed. No pertinent family history.   Review of Systems  Constitutional: Negative for fever, chills, diaphoresis, activity change, appetite change, fatigue and unexpected weight change.  HENT: Negative for congestion, sore throat, rhinorrhea, sneezing, trouble swallowing and sinus pressure.  Eyes: Negative for photophobia and visual disturbance.  Respiratory: Negative for cough, chest tightness, shortness of breath, wheezing and stridor.  Cardiovascular: Negative for chest pain, palpitations and leg swelling.  Gastrointestinal: Negative for nausea, vomiting, abdominal pain, diarrhea, constipation, blood in stool, abdominal distention and anal bleeding.  Genitourinary: Negative for dysuria, hematuria, flank pain and difficulty urinating.  Musculoskeletal: Negative for myalgias, back pain, joint swelling, arthralgias and gait problem.  Skin: Negative for color change, pallor, rash and wound.  Neurological: Negative for dizziness, tremors, weakness and light-headedness.  Hematological: Negative for adenopathy. Does not bruise/bleed easily.  Psychiatric/Behavioral: Negative for behavioral problems,  confusion, sleep disturbance, dysphoric mood, decreased concentration and agitation.     OBJECTIVE: Temp:  [97.4 F (36.3 C)-97.8 F (36.6 C)] 97.6 F (36.4 C) (03/17 1347) Pulse Rate:  [75-79] 75 (03/17 1347) Resp:  [15-16] 16 (03/17 1347) BP: (126-148)/(50-83) 126/83 (03/17 1347) SpO2:  [98 %-100 %] 99 % (03/17 1347) Weight:  [163.7 kg-165.1 kg] 165.1 kg  (03/17 0500) Physical Exam  Constitutional: He is oriented to person, place, and time. He appears well-developed and well-nourished. No distress.  HENT:  Mouth/Throat: Oropharynx is clear and moist. No oropharyngeal exudate.  Cardiovascular: Normal rate, regular rhythm and normal heart sounds. Exam reveals no gallop and no friction rub.  No murmur heard.  Pulmonary/Chest: Effort normal and breath sounds normal. No respiratory distress. He has no wheezes.  Abdominal: Soft. Bowel sounds are normal. He exhibits no distension. There is no tenderness.  Lymphadenopathy:  He has no cervical adenopathy.  Neurological: He is alert and oriented to person, place, and time.  Skin: Skin is warm and dry. No rash noted. No erythema.  Psychiatric: He has a normal mood and affect. His behavior is normal.      LABS: Results for orders placed or performed during the hospital encounter of 07/06/20 (from the past 48 hour(s))  CBC with Differential/Platelet     Status: Abnormal   Collection Time: 07/06/20 12:39 AM  Result Value Ref Range   WBC 10.5 4.0 - 10.5 K/uL   RBC 3.77 (L) 4.22 - 5.81 MIL/uL   Hemoglobin 10.1 (L) 13.0 - 17.0 g/dL   HCT 32.4 (L) 39.0 - 52.0 %   MCV 85.9 80.0 - 100.0 fL   MCH 26.8 26.0 - 34.0 pg   MCHC 31.2 30.0 - 36.0 g/dL   RDW 13.7 11.5 - 15.5 %   Platelets 388 150 - 400 K/uL   nRBC 0.0 0.0 - 0.2 %   Neutrophils Relative % 80 %   Neutro Abs 8.3 (H) 1.7 - 7.7 K/uL   Lymphocytes Relative 10 %   Lymphs Abs 1.1 0.7 - 4.0 K/uL   Monocytes Relative 8 %   Monocytes Absolute 0.9 0.1 - 1.0 K/uL   Eosinophils Relative 1 %   Eosinophils Absolute 0.1 0.0 - 0.5 K/uL   Basophils Relative 1 %   Basophils Absolute 0.1 0.0 - 0.1 K/uL   Immature Granulocytes 0 %   Abs Immature Granulocytes 0.04 0.00 - 0.07 K/uL    Comment: Performed at [facility], [place] [address], [place], [place] [address]  Comprehensive metabolic panel     Status: Abnormal   Collection Time:  07/06/20 12:39 AM  Result Value Ref Range   Sodium 124 (L) 135 - 145 mmol/L   Potassium 5.3 (H) 3.5 - 5.1 mmol/L   Chloride 86 (L) 98 - 111 mmol/L   CO2 17 (L) 22 - 32 mmol/L   Glucose, Bld 94 70 - 99 mg/dL    Comment: Glucose reference range applies only to samples taken after fasting for at least 8 hours.   BUN 95 (H) 8 - 23 mg/dL   Creatinine, Ser 6.26 (H) 0.61 - 1.24 mg/dL   Calcium 7.6 (L) 8.9 - 10.3 mg/dL   Total Protein 7.0 6.5 - 8.1 g/dL   Albumin 3.3 (L) 3.5 - 5.0 g/dL   AST 18 15 - 41 U/L   ALT 13 0 - 44 U/L   Alkaline Phosphatase 74 38 - 126 U/L   Total Bilirubin 0.7 0.3 - 1.2 mg/dL  GFR, Estimated 9 (L) >60 mL/min    Comment: (NOTE) Calculated using the CKD-EPI Creatinine Equation (2021)    Anion gap 21 (H) 5 - 15    Comment: Performed at [facility], [place] [address] [place], [place] [address]  Urinalysis, Routine w reflex microscopic     Status: Abnormal   Collection Time: 07/06/20 12:39 AM  Result Value Ref Range   Color, Urine STRAW (A) YELLOW   APPearance CLEAR CLEAR   Specific Gravity, Urine 1.006 1.005 - 1.030   pH 5.0 5.0 - 8.0   Glucose, UA NEGATIVE NEGATIVE mg/dL   Hgb urine dipstick NEGATIVE NEGATIVE   Bilirubin Urine NEGATIVE NEGATIVE   Ketones, ur NEGATIVE NEGATIVE mg/dL   Protein, ur NEGATIVE NEGATIVE mg/dL   Nitrite NEGATIVE NEGATIVE   Leukocytes,Ua NEGATIVE NEGATIVE    Comment: Performed at [facility] [address] [place], [place] [address]  Urine Culture     Status: Abnormal   Collection Time: 07/06/20 12:39 AM   Specimen: Urine, Random  Result Value Ref Range   Specimen Description      URINE, RANDOM Performed at [facility] [address], [place], [place] [address]    Special Requests      NONE Performed at [facility], [place] [address], [place], [place] [address]    Culture (A)     <10,000 COLONIES/mL INSIGNIFICANT GROWTH Performed at [facility] [address], [place], [place] [address]    Report Status 07/07/2020 FINAL   Procalcitonin - Baseline     Status: None   Collection Time: 07/06/20  1:28 AM  Result Value Ref Range   Procalcitonin 0.27 ng/mL    Comment:        Interpretation: PCT (Procalcitonin) <= 0.5 ng/mL: Systemic infection (sepsis) is not likely. Local bacterial infection is possible. (NOTE)       Sepsis PCT Algorithm           Lower Respiratory Tract                                      Infection PCT Algorithm    ----------------------------     ----------------------------         PCT < 0.25 ng/mL                PCT < 0.10 ng/mL          Strongly encourage             Strongly discourage   discontinuation of antibiotics    initiation of antibiotics    ----------------------------     -----------------------------       PCT 0.25 - 0.50 ng/mL            PCT 0.10 - 0.25 ng/mL               OR       >80% decrease in PCT            Discourage initiation of                                            antibiotics      Encourage discontinuation           of antibiotics    ----------------------------     -----------------------------  PCT >= 0.50 ng/mL              PCT 0.26 - 0.50 ng/mL               AND        <80% decrease in PCT             Encourage initiation of                                             antibiotics       Encourage continuation           of antibiotics    ----------------------------     -----------------------------        PCT >= 0.50 ng/mL                  PCT > 0.50 ng/mL               AND         increase in PCT                  Strongly encourage                                      initiation of antibiotics    Strongly encourage escalation           of antibiotics                                     -----------------------------                                           PCT <= 0.25 ng/mL                                                 OR                                         > 80% decrease in PCT                                      Discontinue / Do not initiate                                             antibiotics  Performed at [facility] [address], [place], [place] [address]   Magnesium     Status: Abnormal   Collection Time: 07/06/20  1:28 AM  Result Value Ref Range   Magnesium 1.1 (L) 1.7 - 2.4 mg/dL    Comment: Performed at [facility], [place] [address] [place], [place] [address]  Phosphorus  Status: Abnormal   Collection Time: 07/06/20  1:28 AM  Result Value Ref Range   Phosphorus 9.1 (H) 2.5 - 4.6 mg/dL    Comment: Performed at [facility], [place] [address], [place], [place] [address]  Culture, blood (Routine X 2) w Reflex to ID Panel     Status: None (Preliminary result)   Collection Time: 07/06/20  3:55 AM   Specimen: BLOOD  Result Value Ref Range   Specimen Description      BLOOD BLOOD LEFT FOREARM Performed at [facility] [address], [place], [place] [address]    Special Requests      BOTTLES DRAWN AEROBIC AND ANAEROBIC Blood Culture adequate volume Performed at [facility] [address] [place], [place] [address]    Culture  Setup Time      AEROBIC BOTTLE ONLY GRAM POSITIVE COCCI CRITICAL VALUE NOTED.  VALUE IS CONSISTENT WITH PREVIOUSLY REPORTED AND CALLED VALUE.    Culture      NO GROWTH 1 DAY Performed at [facility], [place] [address], [place], [place] [address]    Report Status PENDING   Resp Panel by RT-PCR (Flu A&B, Covid) Nasopharyngeal Swab     Status: None   Collection Time: 07/06/20  3:57 AM   Specimen: Nasopharyngeal Swab; Nasopharyngeal(NP) swabs in vial transport medium  Result Value Ref Range   SARS Coronavirus 2 by RT PCR NEGATIVE NEGATIVE    Comment: (NOTE) SARS-CoV-2 target nucleic acids are NOT DETECTED.  The SARS-CoV-2 RNA is generally detectable in upper respiratory specimens  during the acute phase of infection. The lowest concentration of SARS-CoV-2 viral copies this assay can detect is 138 copies/mL. A negative result does not preclude SARS-Cov-2 infection and should not be used as the sole basis for treatment or other patient management decisions. A negative result may occur with  improper specimen collection/handling, submission of specimen other than nasopharyngeal swab, presence of viral mutation(s) within the areas targeted by this assay, and inadequate number of viral copies(<138 copies/mL). A negative result must be combined with clinical observations, patient history, and epidemiological information. The expected result is Negative.  Fact Sheet for Patients:  EntrepreneurPulse.com.au  Fact Sheet for Healthcare Providers:  IncredibleEmployment.be  This test is no t yet approved or cleared by the [place] FDA and  has been authorized for detection and/or diagnosis of SARS-CoV-2 by FDA under an Emergency Use Authorization (EUA). This EUA will remain  in effect (meaning this test can be used) for the duration of the COVID-19 declaration under Section 564(b)(1) of the Act, 21 U.S.C.section 360bbb-3(b)(1), unless the authorization is terminated  or revoked sooner.       Influenza A by PCR NEGATIVE NEGATIVE   Influenza B by PCR NEGATIVE NEGATIVE    Comment: (NOTE) The Xpert Xpress SARS-CoV-2/FLU/RSV plus assay is intended as an aid in the diagnosis of influenza from Nasopharyngeal swab specimens and should not be used as a sole basis for treatment. Nasal washings and aspirates are unacceptable for Xpert Xpress SARS-CoV-2/FLU/RSV testing.  Fact Sheet for Patients: EntrepreneurPulse.com.au  Fact Sheet for Healthcare Providers: IncredibleEmployment.be  This test is not yet approved or cleared by the [place] FDA and has been authorized for detection and/or diagnosis  of SARS-CoV-2 by FDA under an Emergency Use Authorization (EUA). This EUA will remain in effect (meaning this test can be used) for the duration of the COVID-19 declaration under Section 564(b)(1) of the Act, 21 U.S.C. section 360bbb-3(b)(1),  unless the authorization is terminated or revoked.  Performed at [facility], [place] [address], [place], [place] [address]   Culture, blood (Routine X 2) w Reflex to ID Panel     Status: None (Preliminary result)   Collection Time: 07/06/20  4:00 AM   Specimen: BLOOD  Result Value Ref Range   Specimen Description      BLOOD BLOOD RIGHT FOREARM Performed at [facility] [address], [place], [place] [address]    Special Requests      BOTTLES DRAWN AEROBIC AND ANAEROBIC Blood Culture adequate volume Performed at [facility] [address], [place], [place] [address]    Culture  Setup Time      IN BOTH AEROBIC AND ANAEROBIC BOTTLES GRAM POSITIVE COCCI CRITICAL RESULT CALLED TO, READ BACK BY AND VERIFIED WITH: Jacquenette Shone [id number] FCP Performed at [facility], [place] [address], [place], [place] [address]    Culture GRAM POSITIVE COCCI    Report Status PENDING   Blood Culture ID Panel (Reflexed)     Status: Abnormal   Collection Time: 07/06/20  4:00 AM  Result Value Ref Range   Enterococcus faecalis NOT DETECTED NOT DETECTED   Enterococcus Faecium NOT DETECTED NOT DETECTED   Listeria monocytogenes NOT DETECTED NOT DETECTED   Staphylococcus species DETECTED (A) NOT DETECTED    Comment: CRITICAL RESULT CALLED TO, READ BACK BY AND VERIFIED WITH: PHARMD SCOTT C. [id number] FCP    Staphylococcus aureus (BCID) NOT DETECTED NOT DETECTED   Staphylococcus epidermidis DETECTED (A) NOT DETECTED   Staphylococcus lugdunensis NOT DETECTED NOT DETECTED   Streptococcus species NOT DETECTED NOT DETECTED   Streptococcus agalactiae NOT DETECTED NOT DETECTED   Streptococcus pneumoniae  NOT DETECTED NOT DETECTED   Streptococcus pyogenes NOT DETECTED NOT DETECTED   A.calcoaceticus-baumannii NOT DETECTED NOT DETECTED   Bacteroides fragilis NOT DETECTED NOT DETECTED   Enterobacterales NOT DETECTED NOT DETECTED   Enterobacter cloacae complex NOT DETECTED NOT DETECTED   Escherichia coli NOT DETECTED NOT DETECTED   Klebsiella aerogenes NOT DETECTED NOT DETECTED   Klebsiella oxytoca NOT DETECTED NOT DETECTED   Klebsiella pneumoniae NOT DETECTED NOT DETECTED   Proteus species NOT DETECTED NOT DETECTED   Salmonella species NOT DETECTED NOT DETECTED   Serratia marcescens NOT DETECTED NOT DETECTED   Haemophilus influenzae NOT DETECTED NOT DETECTED   Neisseria meningitidis NOT DETECTED NOT DETECTED   Pseudomonas aeruginosa NOT DETECTED NOT DETECTED   Stenotrophomonas maltophilia NOT DETECTED NOT DETECTED   Candida albicans NOT DETECTED NOT DETECTED   Candida auris NOT DETECTED NOT DETECTED   Candida glabrata NOT DETECTED NOT DETECTED   Candida krusei NOT DETECTED NOT DETECTED   Candida parapsilosis NOT DETECTED NOT DETECTED   Candida tropicalis NOT DETECTED NOT DETECTED   Cryptococcus neoformans/gattii NOT DETECTED NOT DETECTED   Methicillin resistance mecA/C NOT DETECTED NOT DETECTED    Comment: Performed at [facility], [place] [address], [place], [place] [id number]  Basic metabolic panel     Status: Abnormal   Collection Time: 07/06/20  7:50 AM  Result Value Ref Range   Sodium 126 (L) 135 - 145 mmol/L   Potassium 5.0 3.5 - 5.1 mmol/L   Chloride 87 (L) 98 - 111 mmol/L   CO2 21 (L) 22 - 32 mmol/L   Glucose, Bld 93 70 - 99 mg/dL    Comment: Glucose reference range applies only to samples taken after fasting for at  least 8 hours.   BUN 96 (H) 8 - 23 mg/dL   Creatinine, Ser 6.56 (H) 0.61 - 1.24 mg/dL   Calcium 7.3 (L) 8.9 - 10.3 mg/dL   GFR, Estimated 8 (L) >60 mL/min    Comment: (NOTE) Calculated using the CKD-EPI Creatinine Equation (2021)    Anion gap 18 (H) 5 - 15     Comment: Performed at [facility], [place] [address], [place], [place] [address]  Strep pneumoniae urinary antigen     Status: None   Collection Time: 07/06/20  7:54 AM  Result Value Ref Range   Strep Pneumo Urinary Antigen NEGATIVE NEGATIVE    Comment:        Infection due to S. pneumoniae cannot be absolutely ruled out since the antigen present may be below the detection limit of the test. Performed at [facility], [place] [address], [place], [place] [address]   Sodium, urine, random     Status: None   Collection Time: 07/06/20  7:54 AM  Result Value Ref Range   Sodium, Ur 27 mmol/L    Comment: Performed at [facility], [place] [address] [place], [place] [address]  Creatinine, urine, random     Status: None   Collection Time: 07/06/20  7:54 AM  Result Value Ref Range   Creatinine, Urine 48.64 mg/dL    Comment: Performed at [facility], [place] [address] [place], [place] [address]  Urea nitrogen, urine     Status: None   Collection Time: 07/06/20  7:54 AM  Result Value Ref Range   Urea Nitrogen, Ur 325 Not Estab. mg/dL    Comment: (NOTE) Performed At: [facility] [place], [place] [id number] Rush Farmer MD [id number]   Osmolality, urine     Status: Abnormal   Collection Time: 07/06/20  8:00 AM  Result Value Ref Range   Osmolality, Ur 226 (L) 300 - 900 mOsm/kg    Comment: REPEATED TO VERIFY PERFORMED AT [facility] Performed at [facility], [place] [address], [place], [place] [address]   MRSA PCR Screening     Status: None   Collection Time: 07/06/20  3:33 PM   Specimen: Nasal Mucosa; Nasopharyngeal  Result Value Ref Range   MRSA by PCR NEGATIVE NEGATIVE    Comment:        The GeneXpert MRSA Assay (FDA approved for NASAL specimens only), is one component of a comprehensive MRSA colonization surveillance program. It is not intended to diagnose MRSA infection nor to  guide or monitor treatment for MRSA infections. Performed at [facility], [place] [address], [place], [place] [address]   Procalcitonin     Status: None   Collection Time: 07/07/20  3:26 AM  Result Value Ref Range   Procalcitonin 0.24 ng/mL    Comment:        Interpretation: PCT (Procalcitonin) <= 0.5 ng/mL: Systemic infection (sepsis) is not likely. Local bacterial infection is possible. (NOTE)       Sepsis PCT Algorithm           Lower Respiratory Tract                                      Infection PCT Algorithm    ----------------------------     ----------------------------         PCT < 0.25 ng/mL  PCT < 0.10 ng/mL          Strongly encourage             Strongly discourage   discontinuation of antibiotics    initiation of antibiotics    ----------------------------     -----------------------------       PCT 0.25 - 0.50 ng/mL            PCT 0.10 - 0.25 ng/mL               OR       >80% decrease in PCT            Discourage initiation of                                            antibiotics      Encourage discontinuation           of antibiotics    ----------------------------     -----------------------------         PCT >= 0.50 ng/mL              PCT 0.26 - 0.50 ng/mL               AND        <80% decrease in PCT             Encourage initiation of                                             antibiotics       Encourage continuation           of antibiotics    ----------------------------     -----------------------------        PCT >= 0.50 ng/mL                  PCT > 0.50 ng/mL               AND         increase in PCT                  Strongly encourage                                      initiation of antibiotics    Strongly encourage escalation           of antibiotics                                     -----------------------------                                           PCT <= 0.25 ng/mL                                                  OR                                        >  80% decrease in PCT                                      Discontinue / Do not initiate                                             antibiotics  Performed at [facility] [address], [place], [place] [address]   CBC     Status: Abnormal   Collection Time: 07/07/20  3:26 AM  Result Value Ref Range   WBC 7.5 4.0 - 10.5 K/uL   RBC 3.51 (L) 4.22 - 5.81 MIL/uL   Hemoglobin 9.4 (L) 13.0 - 17.0 g/dL   HCT 28.7 (L) 39.0 - 52.0 %   MCV 81.8 80.0 - 100.0 fL   MCH 26.8 26.0 - 34.0 pg   MCHC 32.8 30.0 - 36.0 g/dL   RDW 13.6 11.5 - 15.5 %   Platelets 444 (H) 150 - 400 K/uL   nRBC 0.0 0.0 - 0.2 %    Comment: Performed at [facility], [place] [address] [place], [place] [address]  Renal function panel     Status: Abnormal   Collection Time: 07/07/20  3:26 AM  Result Value Ref Range   Sodium 126 (L) 135 - 145 mmol/L   Potassium 5.0 3.5 - 5.1 mmol/L   Chloride 91 (L) 98 - 111 mmol/L   CO2 20 (L) 22 - 32 mmol/L   Glucose, Bld 98 70 - 99 mg/dL    Comment: Glucose reference range applies only to samples taken after fasting for at least 8 hours.   BUN 96 (H) 8 - 23 mg/dL   Creatinine, Ser 6.25 (H) 0.61 - 1.24 mg/dL   Calcium 6.9 (L) 8.9 - 10.3 mg/dL   Phosphorus 7.7 (H) 2.5 - 4.6 mg/dL   Albumin 3.0 (L) 3.5 - 5.0 g/dL   GFR, Estimated 9 (L) >60 mL/min    Comment: (NOTE) Calculated using the CKD-EPI Creatinine Equation (2021)    Anion gap 15 5 - 15    Comment: Performed at [facility], [place] [address], [place], [place] [address]    MICRO:  IMAGING: DG Chest Port 1 View  Result Date: 07/06/2020 CLINICAL DATA:  Weakness, renal insufficiency EXAM: PORTABLE CHEST 1 VIEW COMPARISON:  12/22/2019 FINDINGS: Single frontal view of the chest demonstrates stable enlargement of the cardiac silhouette. There is patchy consolidation at the lung bases. On the right, linear consolidation  favors subsegmental atelectasis or scarring. On the left, retrocardiac consolidation could reflect airspace disease or atelectasis. No effusion or pneumothorax. No acute bony abnormalities. IMPRESSION: 1. Patchy bibasilar consolidation, which may reflect atelectasis or airspace disease. Electronically Signed   By: Randa Ngo M.D.   On: 07/06/2020 01:18   CT RENAL STONE STUDY  Result Date: 07/06/2020 CLINICAL DATA:  Acute renal failure. Unable to void. Concern for stone. EXAM: CT ABDOMEN AND PELVIS WITHOUT CONTRAST TECHNIQUE: Multidetector CT imaging of the abdomen and pelvis was performed following the standard protocol without IV contrast. COMPARISON:  CT abdomen pelvis 12/22/2019 FINDINGS: Lower chest: Mucous impaction within bilateral lower lobes. Bilateral lower lobe subsegmental atelectasis. Bronchiectasis at the left lower lobe. Coronary artery calcifications. Hepatobiliary: No focal liver abnormality. No gallstones, gallbladder wall thickening, or  pericholecystic fluid. No biliary dilatation. Pancreas: No focal lesion. Normal pancreatic contour. No surrounding inflammatory changes. No main pancreatic ductal dilatation. Spleen: Punctate calcifications likely represent sequelae of prior granulomatous disease. Otherwise normal in size without focal abnormality. Adrenals/Urinary Tract: No adrenal nodule bilaterally. Persistent bilateral nonspecific perinephric stranding. Persistent nonspecific stranding along the distal ureters. No nephrolithiasis, no hydronephrosis, and no contour-deforming renal mass. No ureterolithiasis or hydroureter. The urinary bladder is unremarkable. Stomach/Bowel: Stomach is within normal limits. No evidence of bowel wall thickening or dilatation. Sigmoid diverticulosis. Appendix appears normal. Vascular/Lymphatic: No abdominal aorta or iliac aneurysm. Severe atherosclerotic plaque of the aorta and its branches. No abdominal, pelvic, or inguinal lymphadenopathy. Reproductive:  Prostate is unremarkable. Other: No intraperitoneal free fluid. No intraperitoneal free gas. No organized fluid collection. Musculoskeletal: Small fat containing ventral wall hernia with an abdominal defect of 2.5 cm (2:67, 5:155). No suspicious lytic or blastic osseous lesions. No acute displaced fracture. Similar-appearing compression fracture of the T12 vertebral body. Multilevel degenerative changes of the spine. Similar-appearing grade 1 anterolisthesis of L5 on S1. IMPRESSION: 1. No nephroureterolithiasis or hydroureteronephrosis bilaterally. 2. Persistent bilateral nonspecific perinephric and distal ureteral stranding. Correlate with urinalysis for infection. 3. Other imaging findings of potential clinical significance: Bilateral lower lobe mild bronchiectasis and mucous - superimposed post post infection/inflammation not excluded. Sigmoid diverticulosis with no acute diverticulitis. Small fat containing ventral hernia with no findings to suggest ischemia or associated bowel obstruction. Similar-appearing T12 compression fracture. Aortic Atherosclerosis (ICD10-I70.0). Electronically Signed   By: Iven Finn M.D.   On: 07/06/2020 03:41    HISTORICAL MICRO/IMAGING  Assessment/Plan:  22yoM admitted for AKi thought to be ATN from gastroenteritis roughly 2 weeks ago but also incidentally found to have msse bacteremia.  - continue on cefazolin 2gm, renally dosed, Q12 hr - repeat blood cx tomorrow - recommend to start with TTE to see if any signs of vegetations  aki = baseline of 1.3-1.5 from sept labs. Will avoid nephrotoxic medications.currently receiving ivf to see if some improvement. Nephrology also following to provide recommendations  hyponatremia = likely from hypovolemia. Continues to receive iv fluid  Questionable pneumonia = difficult to tell if atelectesis vs infiltrate since not significant hypoxia nor leukocytosis. Currently on cefazolin for bacteremia which would provide coverage if  related to endocarditis. Will continue to monitor for symptoms, cough, to decide if need to broaden coverage.

## 2020-07-07 NOTE — Progress Notes (Signed)
PT Cancellation Note  Patient Details Name: Brian Silva MRN: [id number] DOB: 1941-02-10   Cancelled Treatment:    Reason Eval/Treat Not Completed: Patient declined, no reason specified. Pt refuses PT eval. Therapist offered education and encouragement regarding therapeutic process, but pt continues to decline. Pt states "I only get out of bed to exercise", but refuses to participate with therapist. RN notified, will continue to attempt eval as schedule permits.   Tori Stephany Poorman PT, DPT 07/07/20, 11:09 AM

## 2020-07-07 NOTE — Progress Notes (Signed)
Patient does not want to wear CPAP at this time. Encouraged patient to called if he changes his mind.

## 2020-07-07 NOTE — Progress Notes (Signed)
PHARMACY - PHYSICIAN COMMUNICATION CRITICAL VALUE ALERT - BLOOD CULTURE IDENTIFICATION (BCID)  Brian Silva is an 80 y.o. male who presented to [facility] on 07/06/2020 with a chief complaint of acute renal failure superimposed on CKD III.   Assessment:  Patient admitted with abnormal labs and reduced kidney function. He notably had 3 days of N/V/D within the past 2 weeks. Now with MSSE in 3/4 blood cultures.   Name of physician (or Provider) Contacted:  Curly Rim  Current antibiotics: Ceftriaxone/azith   Changes to prescribed antibiotics recommended:  Change to cefazolin 2 gm IV Q 12 hours   Results for orders placed or performed during the hospital encounter of 07/06/20  Blood Culture ID Panel (Reflexed) (Collected: 07/06/2020  4:00 AM)  Result Value Ref Range   Enterococcus faecalis NOT DETECTED NOT DETECTED   Enterococcus Faecium NOT DETECTED NOT DETECTED   Listeria monocytogenes NOT DETECTED NOT DETECTED   Staphylococcus species DETECTED (A) NOT DETECTED   Staphylococcus aureus (BCID) NOT DETECTED NOT DETECTED   Staphylococcus epidermidis DETECTED (A) NOT DETECTED   Staphylococcus lugdunensis NOT DETECTED NOT DETECTED   Streptococcus species NOT DETECTED NOT DETECTED   Streptococcus agalactiae NOT DETECTED NOT DETECTED   Streptococcus pneumoniae NOT DETECTED NOT DETECTED   Streptococcus pyogenes NOT DETECTED NOT DETECTED   A.calcoaceticus-baumannii NOT DETECTED NOT DETECTED   Bacteroides fragilis NOT DETECTED NOT DETECTED   Enterobacterales NOT DETECTED NOT DETECTED   Enterobacter cloacae complex NOT DETECTED NOT DETECTED   Escherichia coli NOT DETECTED NOT DETECTED   Klebsiella aerogenes NOT DETECTED NOT DETECTED   Klebsiella oxytoca NOT DETECTED NOT DETECTED   Klebsiella pneumoniae NOT DETECTED NOT DETECTED   Proteus species NOT DETECTED NOT DETECTED   Salmonella species NOT DETECTED NOT DETECTED   Serratia marcescens NOT DETECTED NOT DETECTED   Haemophilus  influenzae NOT DETECTED NOT DETECTED   Neisseria meningitidis NOT DETECTED NOT DETECTED   Pseudomonas aeruginosa NOT DETECTED NOT DETECTED   Stenotrophomonas maltophilia NOT DETECTED NOT DETECTED   Candida albicans NOT DETECTED NOT DETECTED   Candida auris NOT DETECTED NOT DETECTED   Candida glabrata NOT DETECTED NOT DETECTED   Candida krusei NOT DETECTED NOT DETECTED   Candida parapsilosis NOT DETECTED NOT DETECTED   Candida tropicalis NOT DETECTED NOT DETECTED   Cryptococcus neoformans/gattii NOT DETECTED NOT DETECTED   Methicillin resistance mecA/C NOT DETECTED NOT DETECTED    Jimmy Footman, PharmD, BCPS, BCIDP Infectious Diseases Clinical Pharmacist Phone: [phone] 07/07/2020  10:27 AM

## 2020-07-07 NOTE — Progress Notes (Signed)
OT Cancellation Note  Patient Details Name: Brian Silva MRN: [id number] DOB: 1941/04/12   Cancelled Treatment:    Reason Eval/Treat Not Completed: Patient declined, no reason specified Encouraged patient to participate with OT however patient stating "I'm tired." Patient states he has been participating with therapy at [facility] however does not want to do therapy while in the hospital. Educated patient on benefits of maintaining strength however patient refusing. Per nursing staff patient has been uncooperative with them as well, mainly calling to use bed pan. Notified RN to let MD know patient is refusing therapy services.  [name] OT OT pager: [place] 07/07/2020, 11:14 AM

## 2020-07-07 NOTE — Consult Note (Addendum)
[facility] Nurse Consult Note: Reason for Consult: Consult requested for buttocks.  Pt has patchy areas of pink partial thickness skin loss which are beginning to become dry and peeling at wound edges, scattered across bilt buttocks and sacrum. Appearance is consistent with resolving moisture associated skin damage.   Pressure Injury POA: This is not a pressure injury Dressing procedure/placement/frequency: Topical treatment orders provided for bedside nurses to perform as follows to protect and promote healing: Foam dressing to bilat buttocks/sacrum; change Q 3 days or PRN soiling. Please re-consult if further assistance is needed.  Thank-you,  Julien Girt MSN, [place], [place], [place], [place]

## 2020-07-07 NOTE — Progress Notes (Addendum)
[facility] PROGRESS NOTE   Brian Silva [id number] DOB: 10/09/1940 DOA: 07/06/2020  PCP: Benito Mccreedy, MD  Brief History/Interval Summary: 80 y.o. male with medical history significant for type 2 diabetes mellitus, hypertension, OSA, history of TIA, and mild chronic renal insufficiency, presented to the emergency department from his SNF for evaluation of abnormal blood work.    About 2 weeks prior to admission patient was experiencing nausea vomiting and diarrhea.  That lasted about 3 days.  Blood work at the skilled nursing facility revealed worsening in his renal function and he was sent over to the ED.  Subsequently hospitalized.    Consultants: Nephrology  Procedures: None yet  Antibiotics: Anti-infectives (From admission, onward)   Start     Dose/Rate Route Frequency Ordered Stop   07/07/20 1200  ceFAZolin (ANCEF) IVPB 2g/100 mL premix        2 g 200 mL/hr over 30 Minutes Intravenous Every 12 hours 07/07/20 1001     07/07/20 0600  cefTRIAXone (ROCEPHIN) 2 g in sodium chloride 0.9 % 100 mL IVPB  Status:  Discontinued        2 g 200 mL/hr over 30 Minutes Intravenous Every 24 hours 07/06/20 0627 07/07/20 1000   07/07/20 0600  azithromycin (ZITHROMAX) 500 mg in sodium chloride 0.9 % 250 mL IVPB  Status:  Discontinued        500 mg 250 mL/hr over 60 Minutes Intravenous Every 24 hours 07/06/20 0627 07/07/20 1000   07/06/20 0400  cefTRIAXone (ROCEPHIN) 1 g in sodium chloride 0.9 % 100 mL IVPB        1 g 200 mL/hr over 30 Minutes Intravenous  Once 07/06/20 0353 07/06/20 0447   07/06/20 0400  azithromycin (ZITHROMAX) 500 mg in sodium chloride 0.9 % 250 mL IVPB        500 mg 250 mL/hr over 60 Minutes Intravenous  Once 07/06/20 0353 07/06/20 [id number]      Subjective/Interval History: Patient denies any complaints this morning.  Specifically no chest pain or shortness of breath.  No nausea or vomiting.  Has been urinating well.     Assessment/Plan:  Acute kidney  injury superimposed on chronic kidney disease stage II with mild hyperkalemia/metabolic acidosis Patient was significantly elevated BUN and creatinine of 35 and 6.26 at admission.  Potassium was 5.3.  Baseline renal function is close to 1.5.  This could have been triggered by his GI symptoms 2 weeks ago and then worsened in the setting of Lasix as well as ACE inhibitor.  UA noted to be bland.  Urine electrolyte studies noted.  Noncontrast CT scan did not show any hydronephrosis.  ACE inhibitor on hold along with the Lasix which is also on hold.   Patient was hydrated.  Nephrology was consulted.  Possibility exists of ATN.  Patient has had good urine output over the last 24 hours.  He remains on IV fluids.  Creatinine only slightly better this morning.  Continue to trend daily.    Bacteremia Staph epidermidis noted in 3 bottles.  Reason for this is not entirely clear.  Does not have any foreign objects such as a Port-A-Cath.  Patient does have stage II sacral wound without any evidence for active infection.  Was suspicion for pneumonia on imaging studies for which he was being treated with ceftriaxone and azithromycin.  See below.  May need to involve ID.  Questionable pneumonia Apparently has had a cough for 2 weeks or so.  Patient was started on  ceftriaxone and azithromycin.  Due to bacteremia he will be changed over to cefazolin. Influenza and COVID-19 PCR negative.  Does not have any oxygen requirements currently.  Hyponatremia Could be due to diuretics.  Sodium level stable.  Continue to monitor.  Nephrology is following.    Essential hypertension Blood pressure is reasonably well controlled.  Monitor off of antihypertensives.  Chronic diastolic CHF Systolic function was preserved in 2021.  Holding furosemide and lisinopril.  Watch closely for signs of volume overload.  Normocytic anemia Likely anemia of chronic disease.  No evidence of overt blood loss.  Drop in hemoglobin is likely  dilutional.  TIA Continue aspirin and statin.  MASD Sacral area We will request wound care to evaluate and manage.  Obstructive sleep apnea Unclear if he uses CPAP not.  Diabetes mellitus type 2 Noted to be on Metformin at home which is currently on hold.  HbA1c was 5.7 in September.  Reasonable at this time to hold off on SSI.  Check glucose daily.  Recent back injury/T12 compression fracture Apparently had a fall with back injury.  That is the reason why he is in SNF for rehab.  Did not need to undergo any surgery per patient.  PT and OT evaluation.    Morbid obesity Estimated body mass index is 52.23 kg/m as calculated from the following:   Height as of this encounter: '5\' 10"'$  (1.778 m).   Weight as of this encounter: 165.1 kg.   DVT Prophylaxis: Subcutaneous heparin Code Status: Full code Family Communication: Discussed with the patient Disposition Plan: Hopefully back to SNF when improved  Status is: Inpatient  Remains inpatient appropriate because:IV treatments appropriate due to intensity of illness or inability to take PO and Inpatient level of care appropriate due to severity of illness   Dispo: The patient is from: SNF              Anticipated d/c is to: SNF              Patient currently is not medically stable to d/c.   Difficult to place patient No      Medications:  Scheduled: . aspirin EC  81 mg Oral Daily  . atorvastatin  20 mg Oral QHS  . heparin  5,000 Units Subcutaneous Q8H   Continuous: . sodium chloride 75 mL/hr at 07/07/20 0817  .  ceFAZolin (ANCEF) IV     [id number] **OR** acetaminophen, ondansetron **OR** ondansetron (ZOFRAN) IV   Objective:  Vital Signs  Vitals:   07/06/20 2100 07/07/20 0206 07/07/20 0410 07/07/20 0500  BP: (!) 144/60 (!) 148/58 (!) 128/50   Pulse: 79 76 77   Resp: '16 15 16   '$ Temp: 97.6 F (36.4 C) (!) 97.4 F (36.3 C) 97.8 F (36.6 C)   TempSrc: Oral Oral Oral   SpO2: 100% 100% 100%   Weight:     (!) 165.1 kg  Height:        Intake/Output Summary (Last 24 hours) at 07/07/2020 1014 Last data filed at 07/07/2020 0847 Gross per 24 hour  Intake 2501.92 ml  Output 2140 ml  Net 361.92 ml   Filed Weights   07/06/20 1814 07/07/20 0500  Weight: (!) 163.7 kg (!) 165.1 kg    General appearance: Awake alert.  In no distress Resp: Clear to auscultation bilaterally.  Normal effort Cardio: S1-S2 is normal regular.  No S3-S4.  No rubs murmurs or bruit GI: Abdomen is soft.  Nontender nondistended.  Bowel sounds are present  normal.  No masses organomegaly Extremities: No edema.  Physical deconditioning noted. Neurologic: Alert and oriented x3.  No focal neurological deficits.      Lab Results:  Data Reviewed: I have personally reviewed following labs and imaging studies  CBC: Recent Labs  Lab 07/06/20 0039 07/07/20 0326  WBC 10.5 7.5  NEUTROABS 8.3*  --   HGB 10.1* 9.4*  HCT 32.4* 28.7*  MCV 85.9 81.8  PLT 388 444*    Basic Metabolic Panel: Recent Labs  Lab 07/06/20 0039 07/06/20 0128 07/06/20 0750 07/07/20 0326  NA 124*  --  126* 126*  K 5.3*  --  5.0 5.0  CL 86*  --  87* 91*  CO2 17*  --  21* 20*  GLUCOSE 94  --  93 98  BUN 95*  --  96* 96*  CREATININE 6.26*  --  6.56* 6.25*  CALCIUM 7.6*  --  7.3* 6.9*  MG  --  1.1*  --   --   PHOS  --  9.1*  --  7.7*    GFR: Estimated Creatinine Clearance: 15.4 mL/min (A) (by C-G formula based on SCr of 6.25 mg/dL (H)).  Liver Function Tests: Recent Labs  Lab 07/06/20 0039 07/07/20 0326  AST 18  --   ALT 13  --   ALKPHOS 74  --   BILITOT 0.7  --   PROT 7.0  --   ALBUMIN 3.3* 3.0*      Recent Results (from the past 240 hour(s))  Urine Culture     Status: Abnormal   Collection Time: 07/06/20 12:39 AM   Specimen: Urine, Random  Result Value Ref Range Status   Specimen Description   Final    URINE, RANDOM Performed at [facility], [place] [address], [place], [place] [address]    Special  Requests   Final    NONE Performed at [facility], [place] [address] [place], [place] [address]    Culture (A)  Final    <10,000 COLONIES/mL INSIGNIFICANT GROWTH Performed at [place] [address] [place], [place] [address]    Report Status 07/07/2020 FINAL  Final  Culture, blood (Routine X 2) w Reflex to ID Panel     Status: None (Preliminary result)   Collection Time: 07/06/20  3:55 AM   Specimen: BLOOD  Result Value Ref Range Status   Specimen Description   Final    BLOOD BLOOD LEFT FOREARM Performed at [employer] [address] [place], [place] [address]    Special Requests   Final    BOTTLES DRAWN AEROBIC AND ANAEROBIC Blood Culture adequate volume Performed at [place] [address], [place], [place] [address]    Culture  Setup Time   Final    AEROBIC BOTTLE ONLY GRAM POSITIVE COCCI CRITICAL VALUE NOTED.  VALUE IS CONSISTENT WITH PREVIOUSLY REPORTED AND CALLED VALUE.    Culture   Final    NO GROWTH 1 DAY Performed at [facility], [place] [address], [place], [place] [address]    Report Status PENDING  Incomplete  Resp Panel by RT-PCR (Flu A&B, Covid) Nasopharyngeal Swab     Status: None   Collection Time: 07/06/20  3:57 AM   Specimen: Nasopharyngeal Swab; Nasopharyngeal(NP) swabs in vial transport medium  Result Value Ref Range Status   SARS Coronavirus 2 by RT PCR NEGATIVE NEGATIVE Final    Comment: (NOTE) SARS-CoV-2 target nucleic acids are NOT  DETECTED.  The SARS-CoV-2 RNA is generally detectable in upper respiratory specimens during the acute phase of infection. The lowest concentration of SARS-CoV-2 viral copies this assay can detect is 138 copies/mL. A negative result does not preclude SARS-Cov-2 infection and should not be used as the sole basis for treatment or other patient management decisions. A negative result may occur with  improper specimen collection/handling,  submission of specimen other than nasopharyngeal swab, presence of viral mutation(s) within the areas targeted by this assay, and inadequate number of viral copies(<138 copies/mL). A negative result must be combined with clinical observations, patient history, and epidemiological information. The expected result is Negative.  Fact Sheet for Patients:  EntrepreneurPulse.com.au  Fact Sheet for Healthcare Providers:  IncredibleEmployment.be  This test is no t yet approved or cleared by the [place] FDA and  has been authorized for detection and/or diagnosis of SARS-CoV-2 by FDA under an Emergency Use Authorization (EUA). This EUA will remain  in effect (meaning this test can be used) for the duration of the COVID-19 declaration under Section 564(b)(1) of the Act, 21 U.S.C.section 360bbb-3(b)(1), unless the authorization is terminated  or revoked sooner.       Influenza A by PCR NEGATIVE NEGATIVE Final   Influenza B by PCR NEGATIVE NEGATIVE Final    Comment: (NOTE) The Xpert Xpress SARS-CoV-2/FLU/RSV plus assay is intended as an aid in the diagnosis of influenza from Nasopharyngeal swab specimens and should not be used as a sole basis for treatment. Nasal washings and aspirates are unacceptable for Xpert Xpress SARS-CoV-2/FLU/RSV testing.  Fact Sheet for Patients: EntrepreneurPulse.com.au  Fact Sheet for Healthcare Providers: IncredibleEmployment.be  This test is not yet approved or cleared by the [place] FDA and has been authorized for detection and/or diagnosis of SARS-CoV-2 by FDA under an Emergency Use Authorization (EUA). This EUA will remain in effect (meaning this test can be used) for the duration of the COVID-19 declaration under Section 564(b)(1) of the Act, 21 U.S.C. section 360bbb-3(b)(1), unless the authorization is terminated or revoked.  Performed at [facility], [place] [address] [place], [place] [address]   Culture, blood (Routine X 2) w Reflex to ID Panel     Status: None (Preliminary result)   Collection Time: 07/06/20  4:00 AM   Specimen: BLOOD  Result Value Ref Range Status   Specimen Description   Final    BLOOD BLOOD RIGHT FOREARM Performed at [facility] [address], [place], [place] [address]    Special Requests   Final    BOTTLES DRAWN AEROBIC AND ANAEROBIC Blood Culture adequate volume Performed at [facility] [address], [place], [place] [address]    Culture  Setup Time   Final    IN BOTH AEROBIC AND ANAEROBIC BOTTLES GRAM POSITIVE COCCI CRITICAL RESULT CALLED TO, READ BACK BY AND VERIFIED WITH: Jacquenette Shone [id number] FCP Performed at [facility], [place] [address], [place], [place] [address]    Culture GRAM POSITIVE COCCI  Final   Report Status PENDING  Incomplete  Blood Culture ID Panel (Reflexed)     Status: Abnormal   Collection Time: 07/06/20  4:00 AM  Result Value Ref Range Status   Enterococcus faecalis NOT DETECTED NOT DETECTED Final   Enterococcus Faecium NOT DETECTED NOT DETECTED Final   Listeria monocytogenes NOT DETECTED NOT DETECTED Final   Staphylococcus species DETECTED (A) NOT DETECTED Final    Comment: CRITICAL RESULT CALLED TO, READ BACK  BY AND VERIFIED WITH: PHARMD SCOTT C. [id number] FCP    Staphylococcus aureus (BCID) NOT DETECTED NOT DETECTED Final   Staphylococcus epidermidis DETECTED (A) NOT DETECTED Final   Staphylococcus lugdunensis NOT DETECTED NOT DETECTED Final   Streptococcus species NOT DETECTED NOT DETECTED Final   Streptococcus agalactiae NOT DETECTED NOT DETECTED Final   Streptococcus pneumoniae NOT DETECTED NOT DETECTED Final   Streptococcus pyogenes NOT DETECTED NOT DETECTED Final   A.calcoaceticus-baumannii NOT DETECTED NOT DETECTED Final   Bacteroides fragilis NOT DETECTED NOT DETECTED Final   Enterobacterales NOT  DETECTED NOT DETECTED Final   Enterobacter cloacae complex NOT DETECTED NOT DETECTED Final   Escherichia coli NOT DETECTED NOT DETECTED Final   Klebsiella aerogenes NOT DETECTED NOT DETECTED Final   Klebsiella oxytoca NOT DETECTED NOT DETECTED Final   Klebsiella pneumoniae NOT DETECTED NOT DETECTED Final   Proteus species NOT DETECTED NOT DETECTED Final   Salmonella species NOT DETECTED NOT DETECTED Final   Serratia marcescens NOT DETECTED NOT DETECTED Final   Haemophilus influenzae NOT DETECTED NOT DETECTED Final   Neisseria meningitidis NOT DETECTED NOT DETECTED Final   Pseudomonas aeruginosa NOT DETECTED NOT DETECTED Final   Stenotrophomonas maltophilia NOT DETECTED NOT DETECTED Final   Candida albicans NOT DETECTED NOT DETECTED Final   Candida auris NOT DETECTED NOT DETECTED Final   Candida glabrata NOT DETECTED NOT DETECTED Final   Candida krusei NOT DETECTED NOT DETECTED Final   Candida parapsilosis NOT DETECTED NOT DETECTED Final   Candida tropicalis NOT DETECTED NOT DETECTED Final   Cryptococcus neoformans/gattii NOT DETECTED NOT DETECTED Final   Methicillin resistance mecA/C NOT DETECTED NOT DETECTED Final    Comment: Performed at [facility], [address] [place], [place] [address]  MRSA PCR Screening     Status: None   Collection Time: 07/06/20  3:33 PM   Specimen: Nasal Mucosa; Nasopharyngeal  Result Value Ref Range Status   MRSA by PCR NEGATIVE NEGATIVE Final    Comment:        The GeneXpert MRSA Assay (FDA approved for NASAL specimens only), is one component of a comprehensive MRSA colonization surveillance program. It is not intended to diagnose MRSA infection nor to guide or monitor treatment for MRSA infections. Performed at [facility], [place] [address], [place], [place] [address]       Radiology Studies: DG Chest Port 1 View  Result Date: 07/06/2020 CLINICAL DATA:  Weakness, renal insufficiency EXAM: PORTABLE CHEST 1 VIEW  COMPARISON:  12/22/2019 FINDINGS: Single frontal view of the chest demonstrates stable enlargement of the cardiac silhouette. There is patchy consolidation at the lung bases. On the right, linear consolidation favors subsegmental atelectasis or scarring. On the left, retrocardiac consolidation could reflect airspace disease or atelectasis. No effusion or pneumothorax. No acute bony abnormalities. IMPRESSION: 1. Patchy bibasilar consolidation, which may reflect atelectasis or airspace disease. Electronically Signed   By: Randa Ngo M.D.   On: 07/06/2020 01:18   CT RENAL STONE STUDY  Result Date: 07/06/2020 CLINICAL DATA:  Acute renal failure. Unable to void. Concern for stone. EXAM: CT ABDOMEN AND PELVIS WITHOUT CONTRAST TECHNIQUE: Multidetector CT imaging of the abdomen and pelvis was performed following the standard protocol without IV contrast. COMPARISON:  CT abdomen pelvis 12/22/2019 FINDINGS: Lower chest: Mucous impaction within bilateral lower lobes. Bilateral lower lobe subsegmental atelectasis. Bronchiectasis at the left lower lobe. Coronary artery calcifications. Hepatobiliary: No focal liver abnormality. No gallstones, gallbladder wall thickening, or pericholecystic fluid. No biliary dilatation.  Pancreas: No focal lesion. Normal pancreatic contour. No surrounding inflammatory changes. No main pancreatic ductal dilatation. Spleen: Punctate calcifications likely represent sequelae of prior granulomatous disease. Otherwise normal in size without focal abnormality. Adrenals/Urinary Tract: No adrenal nodule bilaterally. Persistent bilateral nonspecific perinephric stranding. Persistent nonspecific stranding along the distal ureters. No nephrolithiasis, no hydronephrosis, and no contour-deforming renal mass. No ureterolithiasis or hydroureter. The urinary bladder is unremarkable. Stomach/Bowel: Stomach is within normal limits. No evidence of bowel wall thickening or dilatation. Sigmoid diverticulosis.  Appendix appears normal. Vascular/Lymphatic: No abdominal aorta or iliac aneurysm. Severe atherosclerotic plaque of the aorta and its branches. No abdominal, pelvic, or inguinal lymphadenopathy. Reproductive: Prostate is unremarkable. Other: No intraperitoneal free fluid. No intraperitoneal free gas. No organized fluid collection. Musculoskeletal: Small fat containing ventral wall hernia with an abdominal defect of 2.5 cm (2:67, 5:155). No suspicious lytic or blastic osseous lesions. No acute displaced fracture. Similar-appearing compression fracture of the T12 vertebral body. Multilevel degenerative changes of the spine. Similar-appearing grade 1 anterolisthesis of L5 on S1. IMPRESSION: 1. No nephroureterolithiasis or hydroureteronephrosis bilaterally. 2. Persistent bilateral nonspecific perinephric and distal ureteral stranding. Correlate with urinalysis for infection. 3. Other imaging findings of potential clinical significance: Bilateral lower lobe mild bronchiectasis and mucous - superimposed post post infection/inflammation not excluded. Sigmoid diverticulosis with no acute diverticulitis. Small fat containing ventral hernia with no findings to suggest ischemia or associated bowel obstruction. Similar-appearing T12 compression fracture. Aortic Atherosclerosis (ICD10-I70.0). Electronically Signed   By: Iven Finn M.D.   On: 07/06/2020 03:41       LOS: 1 day   [facility] Pager on [email or web address]  07/07/2020, 10:14 AM

## 2020-07-08 DIAGNOSIS — N179 Acute kidney failure, unspecified: Secondary | ICD-10-CM

## 2020-07-08 DIAGNOSIS — R7881 Bacteremia: Secondary | ICD-10-CM

## 2020-07-08 DIAGNOSIS — N182 Chronic kidney disease, stage 2 (mild): Secondary | ICD-10-CM | POA: Diagnosis not present

## 2020-07-08 LAB — CBC
HCT: 30.1 % — ABNORMAL LOW (ref 39.0–52.0)
Hemoglobin: 10.1 g/dL — ABNORMAL LOW (ref 13.0–17.0)
MCH: 27 pg (ref 26.0–34.0)
MCHC: 33.6 g/dL (ref 30.0–36.0)
MCV: 80.5 fL (ref 80.0–100.0)
Platelets: 453 10*3/uL — ABNORMAL HIGH (ref 150–400)
RBC: 3.74 MIL/uL — ABNORMAL LOW (ref 4.22–5.81)
RDW: 13.7 % (ref 11.5–15.5)
WBC: 7.1 10*3/uL (ref 4.0–10.5)
nRBC: 0 % (ref 0.0–0.2)

## 2020-07-08 LAB — RENAL FUNCTION PANEL
Albumin: 2.8 g/dL — ABNORMAL LOW (ref 3.5–5.0)
Anion gap: 13 (ref 5–15)
BUN: 81 mg/dL — ABNORMAL HIGH (ref 8–23)
CO2: 20 mmol/L — ABNORMAL LOW (ref 22–32)
Calcium: 7.2 mg/dL — ABNORMAL LOW (ref 8.9–10.3)
Chloride: 96 mmol/L — ABNORMAL LOW (ref 98–111)
Creatinine, Ser: 5.5 mg/dL — ABNORMAL HIGH (ref 0.61–1.24)
GFR, Estimated: 10 mL/min — ABNORMAL LOW (ref >60)
Glucose, Bld: 95 mg/dL (ref 70–99)
Phosphorus: 7.4 mg/dL — ABNORMAL HIGH (ref 2.5–4.6)
Potassium: 4.8 mmol/L (ref 3.5–5.1)
Sodium: 129 mmol/L — ABNORMAL LOW (ref 135–145)

## 2020-07-08 LAB — LEGIONELLA PNEUMOPHILA SEROGP 1 UR AG: L. pneumophila Serogp 1 Ur Ag: NEGATIVE

## 2020-07-08 LAB — FOLATE: Folate: 5.2 ng/mL — ABNORMAL LOW (ref >5.9)

## 2020-07-08 LAB — IRON AND TIBC
Iron: 37 ug/dL — ABNORMAL LOW (ref 45–182)
Saturation Ratios: 15 % — ABNORMAL LOW (ref 17.9–39.5)
TIBC: 246 ug/dL — ABNORMAL LOW (ref 250–450)
UIBC: 209 ug/dL

## 2020-07-08 LAB — PROCALCITONIN: Procalcitonin: 0.25 ng/mL

## 2020-07-08 LAB — RETICULOCYTES
Immature Retic Fract: 12.4 % (ref 2.3–15.9)
RBC.: 3.77 MIL/uL — ABNORMAL LOW (ref 4.22–5.81)
Retic Count, Absolute: 56.5 10*3/uL (ref 19.0–186.0)
Retic Ct Pct: 1.5 % (ref 0.4–3.1)

## 2020-07-08 LAB — VITAMIN B12: Vitamin B-12: 241 pg/mL (ref 180–914)

## 2020-07-08 LAB — FERRITIN: Ferritin: 163 ng/mL (ref 24–336)

## 2020-07-08 MED ORDER — VITAMIN B-12 1000 MCG PO TABS
500.0000 ug | ORAL_TABLET | Freq: Every day | ORAL | Status: DC
  Administered 2020-07-08 – 2020-07-11 (×4): 500 ug via ORAL
  Filled 2020-07-08 (×4): qty 1

## 2020-07-08 MED ORDER — FOLIC ACID 1 MG PO TABS
1.0000 mg | ORAL_TABLET | Freq: Every day | ORAL | Status: DC
  Administered 2020-07-08 – 2020-07-11 (×4): 1 mg via ORAL
  Filled 2020-07-08 (×4): qty 1

## 2020-07-08 NOTE — Progress Notes (Signed)
Patient refuses to wear cpap while here at the hospital.

## 2020-07-08 NOTE — Consult Note (Signed)
   [facility] Inpatient Consult   07/08/2020  Brian Silva 1940/06/19 [id number]  Patient chart has been reviewed for [place] care management services under ACO plan due to high unplanned readmission risk score.   Per chart review, patient came to hospital from SNF and current disposition plan is for SNF. No identifiable [facility] CM community needs.  Netta Cedars, MSN, [facility] Liaison Nurse Mobile Phone [phone]  Toll free office [phone]

## 2020-07-08 NOTE — Evaluation (Signed)
Physical Therapy Evaluation Patient Details Name: Brian Silva MRN: [id number] DOB: 10/10/1940 Today's Date: 07/08/2020   History of Present Illness  80 y.o. male with medical history significant for type 2 diabetes mellitus, hypertension, OSA, history of TIA, and mild chronic renal insufficiency, presented to the emergency department from his SNF for evaluation of abnormal blood work.    About 2 weeks prior to admission patient was experiencing nausea vomiting and diarrhea.  That lasted about 3 days.  Blood work at the skilled nursing facility revealed worsening in his renal function and he was sent over to the ED.  Pt admitted for Acute kidney injury superimposed on chronic kidney disease stage II with mild hyperkalemia/metabolic acidosis.  Clinical Impression  Pt admitted with above diagnosis.  Pt currently with functional limitations due to the deficits listed below (see PT Problem List). Pt will benefit from skilled PT to increase their independence and safety with mobility to allow discharge to the venue listed below.  Pt assisted with performing sit to stands for strengthening and technique.  Pt unable to hold standing longer then 15 seconds.  Pt unable to weight shift in standing, unable to take steps.  Recommend return to SNF upon d/c.  SpO2 98% on room air end of session.      Follow Up Recommendations SNF    Equipment Recommendations  None recommended by PT    Recommendations for Other Services       Precautions / Restrictions Precautions Precautions: Fall Restrictions Weight Bearing Restrictions: No      Mobility  Bed Mobility Overal bed mobility: Needs Assistance Bed Mobility: Supine to Sit;Sit to Supine     Supine to sit: Mod assist;HOB elevated Sit to supine: Max assist;+2 for physical assistance;+2 for safety/equipment   General bed mobility comments: Mod assist for hand hold to pull himself up, use of bed pad to pivot hips and for full trunk negotiation. Max  assist for LEs and trunk negotiation to return to supine. Total assist for being pulled up in bed though patient able to assist with UEs.    Transfers Overall transfer level: Needs assistance Equipment used: Rolling walker (2 wheeled) Transfers: Sit to/from Stand Sit to Stand: Max assist;From elevated surface;+2 safety/equipment;+2 physical assistance         General transfer comment: Max +2 to stand with RW from elevated bed height 3x. Unable to take a step. Able to maintain standing for approx 15 seconds before needing to sit.  Ambulation/Gait             General Gait Details: pt unable today  Stairs            Wheelchair Mobility    Modified Rankin (Stroke Patients Only)       Balance Overall balance assessment: Needs assistance Sitting-balance support: No upper extremity supported Sitting balance-Leahy Scale: Good     Standing balance support: Bilateral upper extremity supported;During functional activity Standing balance-Leahy Scale: Poor Standing balance comment: Reliant on walker and external assist to keep him from leaning back.                             Pertinent Vitals/Pain Pain Assessment: Faces Faces Pain Scale: Hurts little more Pain Location: back Pain Descriptors / Indicators: Sore Pain Intervention(s): Repositioned;Monitored during session    Home Living Family/patient expects to be discharged to:: Skilled nursing facility  Prior Function Level of Independence: Needs assistance   Gait / Transfers Assistance Needed: reports using walker to stand but not able to perform excessive walking. Uses wc as well at facility.  ADL's / Homemaking Assistance Needed: Needs assistance for toileting, LB ADLs, set up for UB ADLs        Hand Dominance   Dominant Hand: Right    Extremity/Trunk Assessment   Upper Extremity Assessment Upper Extremity Assessment: Overall WFL for tasks assessed (WFL ROM, 5/5  strength with MMT)    Lower Extremity Assessment Lower Extremity Assessment: Generalized weakness    Cervical / Trunk Assessment Cervical / Trunk Assessment: Normal  Communication   Communication: HOH  Cognition Arousal/Alertness: Awake/alert Behavior During Therapy: WFL for tasks assessed/performed Overall Cognitive Status: Within Functional Limits for tasks assessed                                        General Comments      Exercises     Assessment/Plan    PT Assessment Patient needs continued PT services  PT Problem List Decreased balance;Decreased strength;Decreased mobility;Decreased activity tolerance;Decreased knowledge of use of DME       PT Treatment Interventions DME instruction;Therapeutic exercise;Therapeutic activities;Patient/family education;Functional mobility training;Gait training;Balance training;Wheelchair mobility training    PT Goals (Current goals can be found in the Care Plan section)  Acute Rehab PT Goals Patient Stated Goal: to get stronger PT Goal Formulation: With patient Time For Goal Achievement: 07/22/20 Potential to Achieve Goals: Good    Frequency Min 2X/week   Barriers to discharge        Co-evaluation PT/OT/SLP Co-Evaluation/Treatment: Yes Reason for Co-Treatment: To address functional/ADL transfers;For patient/therapist safety PT goals addressed during session: Mobility/safety with mobility OT goals addressed during session: ADL's and self-care       AM-PAC PT "6 Clicks" Mobility  Outcome Measure Help needed turning from your back to your side while in a flat bed without using bedrails?: A Lot Help needed moving from lying on your back to sitting on the side of a flat bed without using bedrails?: A Lot Help needed moving to and from a bed to a chair (including a wheelchair)?: Total Help needed standing up from a chair using your arms (e.g., wheelchair or bedside chair)?: Total Help needed to walk in  hospital room?: Total Help needed climbing 3-5 steps with a railing? : Total 6 Click Score: 8    End of Session Equipment Utilized During Treatment: Gait belt Activity Tolerance: Patient tolerated treatment well Patient left: in bed;with call bell/phone within reach;with bed alarm set Nurse Communication: Mobility status PT Visit Diagnosis: Other abnormalities of gait and mobility (R26.89);Muscle weakness (generalized) (M62.81)    Time: 1030-1101 PT Time Calculation (min) (ACUTE ONLY): 31 min   Charges:   PT Evaluation $PT Eval Moderate Complexity: 1 Mod     Kati PT, DPT Acute Rehabilitation Services Pager: [phone] Office: [phone]   York Ram E 07/08/2020, 1:27 PM

## 2020-07-08 NOTE — Evaluation (Addendum)
Occupational Therapy Evaluation Patient Details Name: Brian Silva MRN: [id number] DOB: [birth year] Today's Date: 07/08/2020    History of Present Illness 80 y.o. male with medical history significant for type 2 diabetes mellitus, hypertension, OSA, history of TIA, and mild chronic renal insufficiency, presented to the emergency department from his SNF for evaluation of abnormal blood work.    About 2 weeks prior to admission patient was experiencing nausea vomiting and diarrhea.  That lasted about 3 days.  Blood work at the skilled nursing facility revealed worsening in his renal function and he was sent over to the ED.  Pt admitted for Acute kidney injury superimposed on chronic kidney disease stage II with mild hyperkalemia/metabolic acidosis.   Clinical Impression   [name] is a 80 year old man who presents from local facility with generalized weakness, decreased activity tolerance, impaired balance and obesity resulting in a decline in functional abilities. Patient reports he is typically able to transfer and take steps with walker and don pants and underwear at facility. Today patient total assist for toileting and LB dressing, max assist for LB bathing and max x 2 to stand from elevated bed height. Patient unable to take a step and only able to maintain standing with walker approx 15 seconds. Patient demonstrates good strength with MMT but not enough strength or endurance to assist with LE weakness. Patient will benefit from skilled OT services while in hospital to improve deficits and learn compensatory strategies as needed in order to return to PLOF.      Follow Up Recommendations  SNF    Equipment Recommendations  None recommended by OT    Recommendations for Other Services       Precautions / Restrictions Precautions Precautions: Fall Restrictions Weight Bearing Restrictions: No      Mobility Bed Mobility Overal bed mobility: Needs Assistance Bed Mobility:  Supine to Sit;Sit to Supine     Supine to sit: Mod assist;HOB elevated Sit to supine: Max assist;+2 for physical assistance;+2 for safety/equipment   General bed mobility comments: Mod assist for hand hold to pull himself up, use of bed pad to pivot hips and for full trunk negotiation. Max assist for LEs and trunk negotiation to return to supine. Total assist for being pull up in bed though patient able to assist with UEs.    Transfers Overall transfer level: Needs assistance Equipment used: Rolling walker (2 wheeled) Transfers: Sit to/from Stand Sit to Stand: Max assist;From elevated surface;+2 safety/equipment;+2 physical assistance         General transfer comment: Max x 2 to stand with RW from elevated bed height x 3. Unable to take a step. Able to maintain standing for approx 15 seconds before needing to sit.    Balance Overall balance assessment: Needs assistance Sitting-balance support: No upper extremity supported Sitting balance-Leahy Scale: Good     Standing balance support: Bilateral upper extremity supported;During functional activity Standing balance-Leahy Scale: Poor Standing balance comment: Reliant on walker and external assist to keep him from leaning back.                           ADL either performed or assessed with clinical judgement   ADL Overall ADL's : Needs assistance/impaired Eating/Feeding: Set up   Grooming: Set up   Upper Body Bathing: Set up   Lower Body Bathing: Total assistance;Sit to/from stand;+2 for physical assistance;+2 for safety/equipment   Upper Body Dressing : Set up;Sitting  Lower Body Dressing: Total assistance;+2 for physical assistance;+2 for safety/equipment;Sit to/from Health and safety inspector Details (indicate cue type and reason): unable Toileting- Clothing Manipulation and Hygiene: +2 for safety/equipment;Total assistance;+2 for physical assistance;Sit to/from stand               Vision Patient  Visual Report: No change from baseline       Perception     Praxis      Pertinent Vitals/Pain Pain Assessment: No/denies pain     Hand Dominance Right   Extremity/Trunk Assessment Upper Extremity Assessment Upper Extremity Assessment: Overall WFL for tasks assessed (WFL ROM, 5/5 strength with MMT)   Lower Extremity Assessment Lower Extremity Assessment: Defer to PT evaluation   Cervical / Trunk Assessment Cervical / Trunk Assessment: Normal   Communication Communication Communication: HOH   Cognition Arousal/Alertness: Awake/alert Behavior During Therapy: WFL for tasks assessed/performed Overall Cognitive Status: Within Functional Limits for tasks assessed                                     General Comments       Exercises     Shoulder Instructions      Home Living Family/patient expects to be discharged to:: Skilled nursing facility                                        Prior Functioning/Environment Level of Independence: Needs assistance  Gait / Transfers Assistance Needed: reports using walker to stand but not able to perform excessive walking. Uses wc as well at facility. ADL's / Homemaking Assistance Needed: Needs assistance for toileting, LB ADLs, set up for UB ADLs            OT Problem List: Decreased strength;Decreased activity tolerance;Impaired balance (sitting and/or standing);Decreased knowledge of use of DME or AE;Obesity      OT Treatment/Interventions: Self-care/ADL training;Therapeutic exercise;DME and/or AE instruction;Patient/family education;Therapeutic activities;Balance training    OT Goals(Current goals can be found in the care plan section) Acute Rehab OT Goals Patient Stated Goal: to get stronger OT Goal Formulation: With patient Time For Goal Achievement: 07/22/20 Potential to Achieve Goals: Good  OT Frequency: Min 2X/week   Barriers to D/C:            Co-evaluation PT/OT/SLP  Co-Evaluation/Treatment: Yes Reason for Co-Treatment: To address functional/ADL transfers;For patient/therapist safety PT goals addressed during session: Mobility/safety with mobility OT goals addressed during session: ADL's and self-care      AM-PAC OT "6 Clicks" Daily Activity     Outcome Measure Help from another person eating meals?: A Little Help from another person taking care of personal grooming?: A Little Help from another person toileting, which includes using toliet, bedpan, or urinal?: Total Help from another person bathing (including washing, rinsing, drying)?: A Lot Help from another person to put on and taking off regular upper body clothing?: A Little Help from another person to put on and taking off regular lower body clothing?: Total 6 Click Score: 13   End of Session Equipment Utilized During Treatment: Gait belt;Rolling walker Nurse Communication: Mobility status  Activity Tolerance: Patient tolerated treatment well Patient left: in bed;with call bell/phone within reach;with bed alarm set  OT Visit Diagnosis: Unsteadiness on feet (R26.81);Other abnormalities of gait and mobility (R26.89);Muscle weakness (generalized) (M62.81)  Time: 1036-1101 OT Time Calculation (min): 25 min Charges:  OT General Charges $OT Visit: 1 Visit OT Evaluation $OT Eval Moderate Complexity: 1 Mod  Vonda, OTR/L [place]  Office [phone] Pager: [place] 07/08/2020, 1:14 PM

## 2020-07-08 NOTE — Care Management Important Message (Signed)
Important Message  Patient Details IM Letter given to the Patient. Name: Brian Silva MRN: [id number] Date of Birth: 1940/11/28   Medicare Important Message Given:  Yes     Kerin Salen 07/08/2020, 9:44 AM

## 2020-07-08 NOTE — Plan of Care (Signed)
?  Problem: Coping: ?Goal: Level of anxiety will decrease ?Outcome: Progressing ?  ?Problem: Safety: ?Goal: Ability to remain free from injury will improve ?Outcome: Progressing ?  ?

## 2020-07-08 NOTE — Progress Notes (Signed)
  [facility] Progress Note    Assessment/ Plan:    1.Acuterenal injury. His baseline serum creatinine is within normal range as of September 2021. Patient was using the ACE inhibitor lisinopril as well as Lasix in the setting of volume depletion and diarrhea.Urine sediment is blandno evidence of acute GN or acute interstitial nephritis. CT scan does not reveal any evidence of obstruction. This looks like hemodynamically mediated hypotensive episode leading to acute kidney injury I would suspect this is acute tubular necrosis.He is getting better and it appears electrolytes are improving.    2. Hypertension/volume -would continue volume resuscitation with normal saline at 125 cc an hour. Would avoid ACE inhibitors in this patient.  Good urine output with decrease IV fluids to 75 cc an hour  3.Hyponatremia   Improved with IVFs.  4.Hyperkalemia: improved  5.MSSE bacteremia: on Ancef now  6.Diabetes mellitus: per primary  Subjective:    Seen in room.  Doing better today.  Eating and drinking.     Objective:   BP (!) 138/58 (BP Location: Right Arm)   Pulse 75   Temp 98 F (36.7 C) (Oral)   Resp 17   Ht '5\' 10"'$  (1.778 m)   Wt (!) 167.9 kg   SpO2 100%   BMI 53.11 kg/m   Intake/Output Summary (Last 24 hours) at 07/08/2020 1334 Last data filed at 07/08/2020 [id number] Gross per 24 hour  Intake 436 ml  Output 1795 ml  Net -1359 ml   Weight change: 4.197 kg  Physical Exam: Gen: NAD, sitting in bed CVS: RRR Resp: clear Abd: soft Ext: 1+ LE edema   Imaging: No results found.  Labs: BMET Recent Labs  Lab 07/06/20 0039 07/06/20 0128 07/06/20 0750 07/07/20 0326 07/08/20 0347  NA 124*  --  126* 126* 129*  K 5.3*  --  5.0 5.0 4.8  CL 86*  --  87* 91* 96*  CO2 17*  --  21* 20* 20*  GLUCOSE 94  --  93 98 95  BUN 95*  --  96* 96* 81*  CREATININE 6.26*  --  6.56* 6.25* 5.50*  CALCIUM 7.6*  --  7.3* 6.9* 7.2*  PHOS  --  9.1*  --  7.7* 7.4*    CBC Recent Labs  Lab 07/06/20 0039 07/07/20 0326 07/08/20 0347  WBC 10.5 7.5 7.1  NEUTROABS 8.3*  --   --   HGB 10.1* 9.4* 10.1*  HCT 32.4* 28.7* 30.1*  MCV 85.9 81.8 80.5  PLT 388 444* 453*    Medications:    . aspirin EC  81 mg Oral Daily  . atorvastatin  20 mg Oral QHS  . folic acid  1 mg Oral Daily  . heparin  5,000 Units Subcutaneous Q8H  . vitamin B-12  500 mcg Oral Daily      Madelon Lips, MD 07/08/2020, 1:34 PM

## 2020-07-08 NOTE — Progress Notes (Addendum)
[facility] PROGRESS NOTE   HELIOS GELTZ [id number] DOB: 03-12-1941 DOA: 07/06/2020  PCP: Benito Mccreedy, MD  Brief History/Interval Summary: 80 y.o. male with medical history significant for type 2 diabetes mellitus, hypertension, OSA, history of TIA, and mild chronic renal insufficiency, presented to the emergency department from his SNF for evaluation of abnormal blood work.    About 2 weeks prior to admission patient was experiencing nausea vomiting and diarrhea.  That lasted about 3 days.  Blood work at the skilled nursing facility revealed worsening in his renal function and he was sent over to the ED.  Subsequently hospitalized.    Consultants:  Nephrology Infectious disease  Procedures: None yet  Antibiotics: Anti-infectives (From admission, onward)   Start     Dose/Rate Route Frequency Ordered Stop   07/07/20 1200  ceFAZolin (ANCEF) IVPB 2g/100 mL premix        2 g 200 mL/hr over 30 Minutes Intravenous Every 12 hours 07/07/20 1001     07/07/20 0600  cefTRIAXone (ROCEPHIN) 2 g in sodium chloride 0.9 % 100 mL IVPB  Status:  Discontinued        2 g 200 mL/hr over 30 Minutes Intravenous Every 24 hours 07/06/20 0627 07/07/20 1000   07/07/20 0600  azithromycin (ZITHROMAX) 500 mg in sodium chloride 0.9 % 250 mL IVPB  Status:  Discontinued        500 mg 250 mL/hr over 60 Minutes Intravenous Every 24 hours 07/06/20 0627 07/07/20 1000   07/06/20 0400  cefTRIAXone (ROCEPHIN) 1 g in sodium chloride 0.9 % 100 mL IVPB        1 g 200 mL/hr over 30 Minutes Intravenous  Once 07/06/20 0353 07/06/20 0447   07/06/20 0400  azithromycin (ZITHROMAX) 500 mg in sodium chloride 0.9 % 250 mL IVPB        500 mg 250 mL/hr over 60 Minutes Intravenous  Once 07/06/20 0353 07/06/20 [id number]      Subjective/Interval History: Patient denies any complaints morning.  Denies any previous history of bacteremia.  Patient encouraged to work with physical and Occupational  Therapy.   Assessment/Plan:  Acute kidney injury superimposed on chronic kidney disease stage II with mild hyperkalemia/metabolic acidosis Patient was significantly elevated BUN and creatinine of 35 and 6.26 at admission.  Potassium was 5.3.  Baseline renal function is close to 1.5.  This could have been triggered by his GI symptoms 2 weeks ago and then worsened in the setting of Lasix as well as ACE inhibitor.  UA noted to be bland.  Urine electrolyte studies noted.  Noncontrast CT scan did not show any hydronephrosis.  ACE inhibitor on hold along with the Lasix which is also on hold.   Nephrology was consulted.  ATN is considered a possibility.  Patient started on IV fluids.  Creatinine noted to be slightly better today.  Monitor urine output.  Bacteremia Staph epidermidis noted in 3 bottles.  Reason for this is not entirely clear.  Does not have any foreign objects such as a Port-A-Cath.  There is wound in the lower back initially thought to be decubitus but more likely to be MASD per wound care.  Did not show any infection.  No other source of infection found.  Infectious disease has been consulted.  Patient changed over to cefazolin.  Transthoracic echocardiogram will be ordered.   Questionable pneumonia Apparently has had a cough for 2 weeks or so.  Patient was initially started on ceftriaxone and azithromycin.  Changed  over to cefazolin.   Influenza and COVID-19 PCR negative.  Does not have any oxygen requirements currently.  Hyponatremia Could be due to diuretics.  Sodium level has improved to 129 today.  Nephrology continues to follow.    Essential hypertension Blood pressure is reasonably well controlled.  Monitor off of antihypertensives.  Chronic diastolic CHF Systolic function was preserved in 2021.  Holding furosemide and lisinopril.  Watch closely for signs of volume overload.  Normocytic anemia/folic acid deficiency Likely anemia of chronic disease.  No evidence of overt  blood loss.  Drop in hemoglobin is likely dilutional.  Remains stable.  Anemia panel reviewed.  No clear evidence for iron deficiency.  B12 level was borderline low at 241.  Folic acid low at 5.2.  Both of these will be initiated.  TIA Continue aspirin and statin.  MASD Sacral area Wound care has seen.  Obstructive sleep apnea Unclear if he uses CPAP not.  Has not been compliant here in the hospital.  Diabetes mellitus type 2 Noted to be on Metformin at home which is currently on hold.  HbA1c was 5.7 in September.  Reasonable at this time to hold off on SSI.  Check glucose daily.  Recent back injury/T12 compression fracture Apparently had a fall with back injury.  That is the reason why he is in SNF for rehab.  Did not need to undergo any surgery per patient.  PT and OT evaluation.    Morbid obesity Estimated body mass index is 53.11 kg/m as calculated from the following:   Height as of this encounter: '5\' 10"'$  (1.778 m).   Weight as of this encounter: 167.9 kg.   DVT Prophylaxis: Subcutaneous heparin Code Status: Full code Family Communication: Discussed with the patient Disposition Plan: Hopefully back to SNF when improved  Status is: Inpatient  Remains inpatient appropriate because:IV treatments appropriate due to intensity of illness or inability to take PO and Inpatient level of care appropriate due to severity of illness   Dispo: The patient is from: SNF              Anticipated d/c is to: SNF              Patient currently is not medically stable to d/c.   Difficult to place patient No      Medications:  Scheduled: . aspirin EC  81 mg Oral Daily  . atorvastatin  20 mg Oral QHS  . folic acid  1 mg Oral Daily  . heparin  5,000 Units Subcutaneous Q8H   Continuous: .  ceFAZolin (ANCEF) IV Stopped (07/07/20 2330)   [id number] **OR** acetaminophen, ondansetron **OR** ondansetron (ZOFRAN) IV   Objective:  Vital Signs  Vitals:   07/07/20 1347 07/07/20  2000 07/08/20 0356 07/08/20 0411  BP: 126/83 (!) 122/53 (!) 110/51   Pulse: 75 73 71   Resp: '16 18 18   '$ Temp: 97.6 F (36.4 C) 97.6 F (36.4 C) 97.9 F (36.6 C)   TempSrc: Oral Oral Oral   SpO2: 99% 100% 99%   Weight:    (!) 167.9 kg  Height:        Intake/Output Summary (Last 24 hours) at 07/08/2020 1029 Last data filed at 07/08/2020 [id number] Gross per 24 hour  Intake 436 ml  Output 2095 ml  Net -1659 ml   Filed Weights   07/06/20 1814 07/07/20 0500 07/08/20 0411  Weight: (!) 163.7 kg (!) 165.1 kg (!) 167.9 kg    General appearance: Awake alert.  In no distress Resp: Clear to auscultation bilaterally.  Normal effort Cardio: S1-S2 is normal regular.  No S3-S4.  No rubs murmurs or bruit GI: Abdomen is soft.  Nontender nondistended.  Bowel sounds are present normal.  No masses organomegaly Extremities: No edema.   Neurologic:  No focal neurological deficits.      Lab Results:  Data Reviewed: I have personally reviewed following labs and imaging studies  CBC: Recent Labs  Lab 07/06/20 0039 07/07/20 0326 07/08/20 0347  WBC 10.5 7.5 7.1  NEUTROABS 8.3*  --   --   HGB 10.1* 9.4* 10.1*  HCT 32.4* 28.7* 30.1*  MCV 85.9 81.8 80.5  PLT 388 444* 453*    Basic Metabolic Panel: Recent Labs  Lab 07/06/20 0039 07/06/20 0128 07/06/20 0750 07/07/20 0326 07/08/20 0347  NA 124*  --  126* 126* 129*  K 5.3*  --  5.0 5.0 4.8  CL 86*  --  87* 91* 96*  CO2 17*  --  21* 20* 20*  GLUCOSE 94  --  93 98 95  BUN 95*  --  96* 96* 81*  CREATININE 6.26*  --  6.56* 6.25* 5.50*  CALCIUM 7.6*  --  7.3* 6.9* 7.2*  MG  --  1.1*  --   --   --   PHOS  --  9.1*  --  7.7* 7.4*    GFR: Estimated Creatinine Clearance: 17.7 mL/min (A) (by C-G formula based on SCr of 5.5 mg/dL (H)).  Liver Function Tests: Recent Labs  Lab 07/06/20 0039 07/07/20 0326 07/08/20 0347  AST 18  --   --   ALT 13  --   --   ALKPHOS 74  --   --   BILITOT 0.7  --   --   PROT 7.0  --   --   ALBUMIN 3.3*  3.0* 2.8*      Recent Results (from the past 240 hour(s))  Urine Culture     Status: Abnormal   Collection Time: 07/06/20 12:39 AM   Specimen: Urine, Random  Result Value Ref Range Status   Specimen Description   Final    URINE, RANDOM Performed at [facility], [place] [address], [place], [place] [address]    Special Requests   Final    NONE Performed at [facility], [place] [address], [place], [place] [address]    Culture (A)  Final    <10,000 COLONIES/mL INSIGNIFICANT GROWTH Performed at [facility] [address] [place], [place] [address]    Report Status 07/07/2020 FINAL  Final  Culture, blood (Routine X 2) w Reflex to ID Panel     Status: None (Preliminary result)   Collection Time: 07/06/20  3:55 AM   Specimen: BLOOD  Result Value Ref Range Status   Specimen Description   Final    BLOOD BLOOD LEFT FOREARM Performed at [facility] [address] [place], [place] [address]    Special Requests   Final    BOTTLES DRAWN AEROBIC AND ANAEROBIC Blood Culture adequate volume Performed at [facility] [address], [place], [place] [address]    Culture  Setup Time   Final    AEROBIC BOTTLE ONLY GRAM POSITIVE COCCI CRITICAL VALUE NOTED.  VALUE IS CONSISTENT WITH PREVIOUSLY REPORTED AND CALLED VALUE.    Culture   Final    GRAM POSITIVE COCCI IDENTIFICATION TO FOLLOW Performed at [facility], [place]  [address], [place], [place] [address]    Report Status PENDING  Incomplete  Resp Panel by RT-PCR (Flu A&B, Covid) Nasopharyngeal Swab     Status: None   Collection Time: 07/06/20  3:57 AM   Specimen: Nasopharyngeal Swab; Nasopharyngeal(NP) swabs in vial transport medium  Result Value Ref Range Status   SARS Coronavirus 2 by RT PCR NEGATIVE NEGATIVE Final    Comment: (NOTE) SARS-CoV-2 target nucleic acids are NOT DETECTED.  The SARS-CoV-2 RNA is generally detectable in upper  respiratory specimens during the acute phase of infection. The lowest concentration of SARS-CoV-2 viral copies this assay can detect is 138 copies/mL. A negative result does not preclude SARS-Cov-2 infection and should not be used as the sole basis for treatment or other patient management decisions. A negative result may occur with  improper specimen collection/handling, submission of specimen other than nasopharyngeal swab, presence of viral mutation(s) within the areas targeted by this assay, and inadequate number of viral copies(<138 copies/mL). A negative result must be combined with clinical observations, patient history, and epidemiological information. The expected result is Negative.  Fact Sheet for Patients:  EntrepreneurPulse.com.au  Fact Sheet for Healthcare Providers:  IncredibleEmployment.be  This test is no t yet approved or cleared by the [place] FDA and  has been authorized for detection and/or diagnosis of SARS-CoV-2 by FDA under an Emergency Use Authorization (EUA). This EUA will remain  in effect (meaning this test can be used) for the duration of the COVID-19 declaration under Section 564(b)(1) of the Act, 21 U.S.C.section 360bbb-3(b)(1), unless the authorization is terminated  or revoked sooner.       Influenza A by PCR NEGATIVE NEGATIVE Final   Influenza B by PCR NEGATIVE NEGATIVE Final    Comment: (NOTE) The Xpert Xpress SARS-CoV-2/FLU/RSV plus assay is intended as an aid in the diagnosis of influenza from Nasopharyngeal swab specimens and should not be used as a sole basis for treatment. Nasal washings and aspirates are unacceptable for Xpert Xpress SARS-CoV-2/FLU/RSV testing.  Fact Sheet for Patients: EntrepreneurPulse.com.au  Fact Sheet for Healthcare Providers: IncredibleEmployment.be  This test is not yet approved or cleared by the [place] FDA and has been  authorized for detection and/or diagnosis of SARS-CoV-2 by FDA under an Emergency Use Authorization (EUA). This EUA will remain in effect (meaning this test can be used) for the duration of the COVID-19 declaration under Section 564(b)(1) of the Act, 21 U.S.C. section 360bbb-3(b)(1), unless the authorization is terminated or revoked.  Performed at [facility], [place] [address] [place], [place] [address]   Culture, blood (Routine X 2) w Reflex to ID Panel     Status: Abnormal (Preliminary result)   Collection Time: 07/06/20  4:00 AM   Specimen: BLOOD  Result Value Ref Range Status   Specimen Description   Final    BLOOD BLOOD RIGHT FOREARM Performed at [facility] [address], [place], [place] [address]    Special Requests   Final    BOTTLES DRAWN AEROBIC AND ANAEROBIC Blood Culture adequate volume Performed at [facility] [address], [place], [place] [address]    Culture  Setup Time   Final    IN BOTH AEROBIC AND ANAEROBIC BOTTLES GRAM POSITIVE COCCI CRITICAL RESULT CALLED TO, READ BACK BY AND VERIFIED WITH: Jacquenette Shone [id number] FCP Performed at [facility], [place] [address] [place],  [address]    Culture STAPHYLOCOCCUS EPIDERMIDIS (A)  Final   Report Status  PENDING  Incomplete  Blood Culture ID Panel (Reflexed)     Status: Abnormal   Collection Time: 07/06/20  4:00 AM  Result Value Ref Range Status   Enterococcus faecalis NOT DETECTED NOT DETECTED Final   Enterococcus Faecium NOT DETECTED NOT DETECTED Final   Listeria monocytogenes NOT DETECTED NOT DETECTED Final   Staphylococcus species DETECTED (A) NOT DETECTED Final    Comment: CRITICAL RESULT CALLED TO, READ BACK BY AND VERIFIED WITH: PHARMD SCOTT C. [id number] FCP    Staphylococcus aureus (BCID) NOT DETECTED NOT DETECTED Final   Staphylococcus epidermidis DETECTED (A) NOT DETECTED Final   Staphylococcus lugdunensis NOT DETECTED NOT  DETECTED Final   Streptococcus species NOT DETECTED NOT DETECTED Final   Streptococcus agalactiae NOT DETECTED NOT DETECTED Final   Streptococcus pneumoniae NOT DETECTED NOT DETECTED Final   Streptococcus pyogenes NOT DETECTED NOT DETECTED Final   A.calcoaceticus-baumannii NOT DETECTED NOT DETECTED Final   Bacteroides fragilis NOT DETECTED NOT DETECTED Final   Enterobacterales NOT DETECTED NOT DETECTED Final   Enterobacter cloacae complex NOT DETECTED NOT DETECTED Final   Escherichia coli NOT DETECTED NOT DETECTED Final   Klebsiella aerogenes NOT DETECTED NOT DETECTED Final   Klebsiella oxytoca NOT DETECTED NOT DETECTED Final   Klebsiella pneumoniae NOT DETECTED NOT DETECTED Final   Proteus species NOT DETECTED NOT DETECTED Final   Salmonella species NOT DETECTED NOT DETECTED Final   Serratia marcescens NOT DETECTED NOT DETECTED Final   Haemophilus influenzae NOT DETECTED NOT DETECTED Final   Neisseria meningitidis NOT DETECTED NOT DETECTED Final   Pseudomonas aeruginosa NOT DETECTED NOT DETECTED Final   Stenotrophomonas maltophilia NOT DETECTED NOT DETECTED Final   Candida albicans NOT DETECTED NOT DETECTED Final   Candida auris NOT DETECTED NOT DETECTED Final   Candida glabrata NOT DETECTED NOT DETECTED Final   Candida krusei NOT DETECTED NOT DETECTED Final   Candida parapsilosis NOT DETECTED NOT DETECTED Final   Candida tropicalis NOT DETECTED NOT DETECTED Final   Cryptococcus neoformans/gattii NOT DETECTED NOT DETECTED Final   Methicillin resistance mecA/C NOT DETECTED NOT DETECTED Final    Comment: Performed at [facility], [address], [place], [place] [address]  MRSA PCR Screening     Status: None   Collection Time: 07/06/20  3:33 PM   Specimen: Nasal Mucosa; Nasopharyngeal  Result Value Ref Range Status   MRSA by PCR NEGATIVE NEGATIVE Final    Comment:        The GeneXpert MRSA Assay (FDA approved for NASAL specimens only), is one component of a comprehensive  MRSA colonization surveillance program. It is not intended to diagnose MRSA infection nor to guide or monitor treatment for MRSA infections. Performed at [facility], [place] [address], [place], [place] [address]       Radiology Studies: No results found.     LOS: 2 days   [facility] Pager on [email or web address]  07/08/2020, 10:29 AM

## 2020-07-08 NOTE — Progress Notes (Signed)
    [facility] for Infectious Disease    Date of Admission:  07/06/2020   Total days of antibiotics 4   ID: Brian Silva is a 80 y.o. male with   Principal Problem:   Acute renal failure superimposed on stage 2 chronic kidney disease (HCC) Active Problems:   SLEEP APNEA, OBSTRUCTIVE   Essential hypertension   AKI (acute kidney injury) ([place])   Diabetes mellitus type II, non insulin dependent (HCC)   Hyponatremia   Pneumonia of both lower lobes due to infectious organism   Metabolic acidosis   Uremia   Hyperkalemia   Chronic diastolic CHF (congestive heart failure) (HCC)    Subjective: afebrile  Medications:  . aspirin EC  81 mg Oral Daily  . atorvastatin  20 mg Oral QHS  . folic acid  1 mg Oral Daily  . heparin  5,000 Units Subcutaneous Q8H  . vitamin B-12  500 mcg Oral Daily    Objective: Vital signs in last 24 hours: Temp:  [97.6 F (36.4 C)-98 F (36.7 C)] 98 F (36.7 C) (03/18 1324) Pulse Rate:  [71-75] 75 (03/18 1324) Resp:  [17-18] 17 (03/18 1324) BP: (110-138)/(51-58) 138/58 (03/18 1324) SpO2:  [99 %-100 %] 100 % (03/18 1324) Weight:  [167.9 kg] 167.9 kg (03/18 0411) Physical Exam  Constitutional: He is oriented to person, place, and time. He appears well-developed and well-nourished. No distress.  HENT:  Mouth/Throat: Oropharynx is clear and moist. No oropharyngeal exudate.  Cardiovascular: Normal rate, regular rhythm and normal heart sounds. Exam reveals no gallop and no friction rub.  No murmur heard.  Pulmonary/Chest: Effort normal and breath sounds normal. No respiratory distress. He has no wheezes.  Abdominal: Soft. Bowel sounds are normal. He exhibits no distension. There is no tenderness.  Lymphadenopathy:  He has no cervical adenopathy.  Neurological: He is alert and oriented to person, place, and time.  Skin: Skin is warm and dry. Scaly lower extremities Ext= + 2 pitting edema Psychiatric: He has a normal mood and affect. His behavior  is normal.   Lab Results Recent Labs    07/07/20 0326 07/08/20 0347  WBC 7.5 7.1  HGB 9.4* 10.1*  HCT 28.7* 30.1*  NA 126* 129*  K 5.0 4.8  CL 91* 96*  CO2 20* 20*  BUN 96* 81*  CREATININE 6.25* 5.50*   Liver Panel Recent Labs    07/06/20 0039 07/07/20 0326 07/08/20 0347  PROT 7.0  --   --   ALBUMIN 3.3* 3.0* 2.8*  AST 18  --   --   ALT 13  --   --   ALKPHOS 74  --   --   BILITOT 0.7  --   --     Microbiology: Blood cx: staph hominis Blood cx: staph epi Studies/Results: No results found.   Assessment/Plan: Staph species bacteremia = appears that the blood cx have isolated 2 different staph species, thus this likely isn't a true bacteremia. This also makes sense given that he didn't have any clinical symptoms to suggest bacteremia -- no fever, no leukocytosis or other source of infection. Recommend to stop iv abtx. Observe off of antibiotics  aki on ckd = continue with recommendations per nephrology and dr Lyman [facility] management. Labs reveal slight improvement today  Will sign off.  [facility] for Infectious Diseases Cell: [phone] Pager: [phone]  07/08/2020, 4:15 PM

## 2020-07-09 ENCOUNTER — Inpatient Hospital Stay (HOSPITAL_COMMUNITY): Payer: Medicare Other

## 2020-07-09 DIAGNOSIS — R7881 Bacteremia: Secondary | ICD-10-CM

## 2020-07-09 LAB — RENAL FUNCTION PANEL
Albumin: 2.7 g/dL — ABNORMAL LOW (ref 3.5–5.0)
Anion gap: 14 (ref 5–15)
BUN: 77 mg/dL — ABNORMAL HIGH (ref 8–23)
CO2: 21 mmol/L — ABNORMAL LOW (ref 22–32)
Calcium: 7.3 mg/dL — ABNORMAL LOW (ref 8.9–10.3)
Chloride: 94 mmol/L — ABNORMAL LOW (ref 98–111)
Creatinine, Ser: 5.37 mg/dL — ABNORMAL HIGH (ref 0.61–1.24)
GFR, Estimated: 10 mL/min — ABNORMAL LOW (ref >60)
Glucose, Bld: 108 mg/dL — ABNORMAL HIGH (ref 70–99)
Phosphorus: 5.5 mg/dL — ABNORMAL HIGH (ref 2.5–4.6)
Potassium: 4.5 mmol/L (ref 3.5–5.1)
Sodium: 129 mmol/L — ABNORMAL LOW (ref 135–145)

## 2020-07-09 LAB — ECHOCARDIOGRAM COMPLETE
Area-P 1/2: 3.37 cm2
Height: 70 in
Weight: 5918.91 oz

## 2020-07-09 LAB — CBC
HCT: 26.8 % — ABNORMAL LOW (ref 39.0–52.0)
Hemoglobin: 8.8 g/dL — ABNORMAL LOW (ref 13.0–17.0)
MCH: 26.7 pg (ref 26.0–34.0)
MCHC: 32.8 g/dL (ref 30.0–36.0)
MCV: 81.2 fL (ref 80.0–100.0)
Platelets: 466 10*3/uL — ABNORMAL HIGH (ref 150–400)
RBC: 3.3 MIL/uL — ABNORMAL LOW (ref 4.22–5.81)
RDW: 13.7 % (ref 11.5–15.5)
WBC: 9.2 10*3/uL (ref 4.0–10.5)
nRBC: 0 % (ref 0.0–0.2)

## 2020-07-09 MED ORDER — SODIUM CHLORIDE 0.9 % IV SOLN
510.0000 mg | Freq: Once | INTRAVENOUS | Status: AC
  Administered 2020-07-09: 510 mg via INTRAVENOUS
  Filled 2020-07-09: qty 510

## 2020-07-09 MED ORDER — MELATONIN 3 MG PO TABS
3.0000 mg | ORAL_TABLET | Freq: Every day | ORAL | Status: DC
  Administered 2020-07-09 – 2020-07-10 (×2): 3 mg via ORAL
  Filled 2020-07-09 (×2): qty 1

## 2020-07-09 MED ORDER — SODIUM CHLORIDE 0.9 % IV SOLN: 250.0000 mg | Freq: Once | INTRAVENOUS | Status: DC

## 2020-07-09 MED ORDER — GUAIFENESIN ER 600 MG PO TB12
600.0000 mg | ORAL_TABLET | Freq: Two times a day (BID) | ORAL | Status: DC
  Administered 2020-07-09 – 2020-07-11 (×4): 600 mg via ORAL
  Filled 2020-07-09 (×4): qty 1

## 2020-07-09 MED ORDER — PERFLUTREN LIPID MICROSPHERE
1.0000 mL | INTRAVENOUS | Status: AC | PRN
  Administered 2020-07-09: 2 mL via INTRAVENOUS
  Filled 2020-07-09: qty 10

## 2020-07-09 NOTE — NC FL2 (Signed)
[place] LEVEL OF CARE SCREENING TOOL     IDENTIFICATION  Patient Name: Brian Silva Birthdate: Dec 30, 1940 Sex: male Admission Date (Current Location): 07/06/2020  [place] and [place] Number:  [name] [id number] [place] and Address:  [facility],  [place], [place]      Provider Number: [id number]  Attending Physician Name and Address:  Bonnielee Haff, MD  Relative Name and Phone Number:  Gwenlyn Perking [phone]  [phone]    Current Level of Care: Hospital Recommended Level of Care: [facility] [place] Prior Approval Number:    Date Approved/Denied:   PASRR Number: [id number]  Discharge Plan: SNF    Current Diagnoses: Patient Active Problem List   Diagnosis Date Noted  . Bacteremia   . Acute renal failure superimposed on stage 2 chronic kidney disease ([place]) 07/06/2020  . Metabolic acidosis [id number]  . Uremia 07/06/2020  . Hyperkalemia 07/06/2020  . Chronic diastolic CHF (congestive heart failure) ([place]) 07/06/2020  . Pneumonia of both lower lobes due to infectious organism 12/23/2019  . CAP (community acquired pneumonia) 12/22/2019  . Compression fracture of body of thoracic vertebra ([place]) 09/10/2019  . Fall at home, initial encounter 09/10/2019  . AKI (acute kidney injury) ([place]) 01/17/2018  . Syncope, vasovagal 01/17/2018  . Diabetes mellitus type II, non insulin dependent ([place]) 01/17/2018  . Hyponatremia 01/17/2018  . Syncope 01/17/2018  . Depression 10/25/2015  . Hyperlipidemia 10/25/2015  . TINEA PEDIS 01/04/2010  . TINEA VERSICOLOR 01/04/2010  . URI 01/04/2010  . HYPERGLYCEMIA 12/02/2008  . ABDOMINAL WALL HERNIA 12/01/2007  . OBESITY, MORBID 04/04/2007  . SLEEP APNEA, OBSTRUCTIVE 04/04/2007  . LOW BACK PAIN, CHRONIC 12/07/2004  . HYPERLIPIDEMIA 08/12/2002  . ALLERGIC RHINITIS, SEASONAL 08/12/2002  . Essential hypertension 04/01/2000  . Asthma 10/11/1997  . SUPERFICIAL  PHLEBITIS 05/06/1990    Orientation RESPIRATION BLADDER Height & Weight     Self,Place  Normal Continent Weight: (!) 369 lb 14.9 oz (167.8 kg) Height:  '5\' 10"'$  (177.8 cm)  BEHAVIORAL SYMPTOMS/MOOD NEUROLOGICAL BOWEL NUTRITION STATUS      Incontinent Diet (Renal diet)  AMBULATORY STATUS COMMUNICATION OF NEEDS Skin   Limited Assist Verbally Normal                       Personal Care Assistance Level of Assistance  Bathing,Feeding,Dressing Bathing Assistance: Limited assistance Feeding assistance: Independent Dressing Assistance: Limited assistance     Functional Limitations Info  Sight,Hearing,Speech Sight Info: Adequate Hearing Info: Adequate Speech Info: Adequate    SPECIAL CARE FACTORS FREQUENCY  PT (By licensed PT),OT (By licensed OT)     PT Frequency: Minimum 5x a week OT Frequency: Minimum 5x a week            Contractures Contractures Info: Not present    Additional Factors Info  Code Status,Allergies Code Status Info: Full Code Allergies Info: NKA           Current Medications (07/09/2020):  This is the current hospital active medication list Current Facility-Administered Medications  Medication Dose Route Frequency Provider Last Rate Last Admin  . acetaminophen (TYLENOL) tablet 650 mg  650 mg Oral Q6H PRN Opyd, Ilene Qua, MD       Or  . acetaminophen (TYLENOL) suppository 650 mg  650 mg Rectal Q6H PRN Opyd, Ilene Qua, MD      . aspirin EC tablet 81 mg  81 mg Oral Daily Opyd, Ilene Qua, MD   81 mg at 07/09/20 0916  .  atorvastatin (LIPITOR) tablet 20 mg  20 mg Oral QHS Opyd, Ilene Qua, MD   20 mg at 07/08/20 2228  . folic acid (FOLVITE) tablet 1 mg  1 mg Oral Daily Bonnielee Haff, MD   1 mg at 07/09/20 0916  . heparin injection 5,000 Units  5,000 Units Subcutaneous Q8H Opyd, Ilene Qua, MD   5,000 Units at 07/09/20 1407  . melatonin tablet 3 mg  3 mg Oral QHS Bonnielee Haff, MD      . ondansetron [facility]) tablet 4 mg  4 mg Oral Q6H PRN Opyd,  Ilene Qua, MD       Or  . ondansetron (ZOFRAN) injection 4 mg  4 mg Intravenous Q6H PRN Opyd, Ilene Qua, MD      . vitamin B-12 (CYANOCOBALAMIN) tablet 500 mcg  500 mcg Oral Daily Bonnielee Haff, MD   500 mcg at 07/09/20 [id number]     Discharge Medications: Please see discharge summary for a list of discharge medications.  Relevant Imaging Results:  Relevant Lab Results:   Additional Information SSN [id number]  Ross Ludwig, LCSW

## 2020-07-09 NOTE — Progress Notes (Signed)
[facility] PROGRESS NOTE   Brian Silva [id number] DOB: April 02, 1941 DOA: 07/06/2020  PCP: Benito Mccreedy, MD  Brief History/Interval Summary: 80 y.o. male with medical history significant for type 2 diabetes mellitus, hypertension, OSA, history of TIA, and mild chronic renal insufficiency, presented to the emergency department from his SNF for evaluation of abnormal blood work.    About 2 weeks prior to admission patient was experiencing nausea vomiting and diarrhea.  That lasted about 3 days.  Blood work at the skilled nursing facility revealed worsening in his renal function and he was sent over to the ED.  Subsequently hospitalized.    Consultants:  Nephrology Infectious disease  Procedures: Transthoracic echocardiogram has been ordered  Antibiotics: Anti-infectives (From admission, onward)   Start     Dose/Rate Route Frequency Ordered Stop   07/07/20 1200  ceFAZolin (ANCEF) IVPB 2g/100 mL premix  Status:  Discontinued        2 g 200 mL/hr over 30 Minutes Intravenous Every 12 hours 07/07/20 1001 07/08/20 1451   07/07/20 0600  cefTRIAXone (ROCEPHIN) 2 g in sodium chloride 0.9 % 100 mL IVPB  Status:  Discontinued        2 g 200 mL/hr over 30 Minutes Intravenous Every 24 hours 07/06/20 0627 07/07/20 1000   07/07/20 0600  azithromycin (ZITHROMAX) 500 mg in sodium chloride 0.9 % 250 mL IVPB  Status:  Discontinued        500 mg 250 mL/hr over 60 Minutes Intravenous Every 24 hours 07/06/20 0627 07/07/20 1000   07/06/20 0400  cefTRIAXone (ROCEPHIN) 1 g in sodium chloride 0.9 % 100 mL IVPB        1 g 200 mL/hr over 30 Minutes Intravenous  Once 07/06/20 0353 07/06/20 0447   07/06/20 0400  azithromycin (ZITHROMAX) 500 mg in sodium chloride 0.9 % 250 mL IVPB        500 mg 250 mL/hr over 60 Minutes Intravenous  Once 07/06/20 0353 07/06/20 [id number]      Subjective/Interval History: Patient not very communicative.  Shortness of breath or chest pain.  Urinating well.  Had  difficulty sleeping last night.  Asking for medicine to help with this.  States that he does not use CPAP.     Assessment/Plan:  Acute kidney injury superimposed on chronic kidney disease stage II with mild hyperkalemia/metabolic acidosis Patient was significantly elevated BUN and creatinine of 35 and 6.26 at admission.  Potassium was 5.3.  Baseline renal function is close to 1.5.  This could have been triggered by his GI symptoms 2 weeks ago and then worsened in the setting of Lasix as well as ACE inhibitor.  UA noted to be bland.  Urine electrolyte studies noted.  Noncontrast CT scan did not show any hydronephrosis.  ACE inhibitor on hold along with the Lasix which is also on hold.  ATN is considered a possibility. Nephrology continues to follow.  Renal function is proving.  Monitor urine output.  Currently he is off of IV fluids.  Will defer to nephrology.  Bacteremia, likely contaminant Patient growing staph hominis and staph epidermidis.  Infectious disease was consulted.  Patient was noted to be febrile.  He had normal WBC.  Does not have any infected wounds.  Does not have any Port-A-Cath or other foreign objects.  This is thought to be a contaminant.  Antibiotics were discontinued yesterday.  Echocardiogram is pending.    Questionable pneumonia Apparently has had a cough for 2 weeks or so.  Patient  was initially started on ceftriaxone and azithromycin.  Changed over to cefazolin.  Currently off of antibiotics.  Does not have any respiratory symptoms. Influenza and COVID-19 PCR negative.  Does not have any oxygen requirements currently.  Hyponatremia Could be due to diuretics.  Sodium level has improved and stable.    Essential hypertension Blood pressure is reasonably well controlled.  Monitor off of antihypertensives.  Chronic diastolic CHF Systolic function was preserved in 2021.  Holding furosemide and lisinopril.  Watch closely for signs of volume overload.  Normocytic  anemia/folic acid deficiency Likely anemia of chronic disease.  Anemia panel reviewed.  No clear evidence for iron deficiency.  B12 level was borderline low at 241.  Folic acid low at 5.2.  Both of these will be initiated. Drop in hemoglobin this morning is likely dilutional.  No overt bleeding noted.  TIA Continue aspirin and statin.  MASD Sacral area Wound care has seen.  Obstructive sleep apnea Patient mentions that he has not been using CPAP at all prior to admission.  Has been noncompliant here in the hospital.  Discontinued.  Diabetes mellitus type 2 Noted to be on Metformin at home which is currently on hold.  HbA1c was 5.7 in September.  Reasonable at this time to hold off on SSI.  Check glucose daily.  Recent back injury/T12 compression fracture Apparently had a fall with back injury.  That is the reason why he is in SNF for rehab.  Did not need to undergo any surgery per patient.  PT and OT evaluation.    Morbid obesity Estimated body mass index is 53.08 kg/m as calculated from the following:   Height as of this encounter: '5\' 10"'$  (1.778 m).   Weight as of this encounter: 167.8 kg.   DVT Prophylaxis: Subcutaneous heparin Code Status: Full code Family Communication: Discussed with the patient Disposition Plan: Hopefully back to SNF when improved  Status is: Inpatient  Remains inpatient appropriate because:IV treatments appropriate due to intensity of illness or inability to take PO and Inpatient level of care appropriate due to severity of illness   Dispo: The patient is from: SNF              Anticipated d/c is to: SNF              Patient currently is not medically stable to d/c.   Difficult to place patient No      Medications:  Scheduled: . aspirin EC  81 mg Oral Daily  . atorvastatin  20 mg Oral QHS  . folic acid  1 mg Oral Daily  . heparin  5,000 Units Subcutaneous Q8H  . vitamin B-12  500 mcg Oral Daily   Continuous:  [id number] **OR**  acetaminophen, ondansetron **OR** ondansetron (ZOFRAN) IV, perflutren lipid microspheres (DEFINITY) IV suspension   Objective:  Vital Signs  Vitals:   07/08/20 1324 07/08/20 2009 07/09/20 0509 07/09/20 0600  BP: (!) 138/58 90/72 (!) 124/51   Pulse: 75 81 84   Resp: 17 16    Temp: 98 F (36.7 C) 97.8 F (36.6 C) 98.8 F (37.1 C)   TempSrc: Oral  Oral   SpO2: 100% 100% 96%   Weight:    (!) 167.8 kg  Height:        Intake/Output Summary (Last 24 hours) at 07/09/2020 1130 Last data filed at 07/09/2020 0854 Gross per 24 hour  Intake 952 ml  Output 1650 ml  Net -698 ml   Filed Weights   07/07/20  0500 07/08/20 0411 07/09/20 0600  Weight: (!) 165.1 kg (!) 167.9 kg (!) 167.8 kg    General appearance: Awake alert.  In no distress Resp: Clear to auscultation bilaterally.  Normal effort Cardio: S1-S2 is normal regular.  No S3-S4.  No rubs murmurs or bruit GI: Abdomen is soft.  Nontender nondistended.  Bowel sounds are present normal.  No masses organomegaly Extremities: No edema.   Neurologic:  No focal neurological deficits.     Lab Results:  Data Reviewed: I have personally reviewed following labs and imaging studies  CBC: Recent Labs  Lab 07/06/20 0039 07/07/20 0326 07/08/20 0347 07/09/20 0334  WBC 10.5 7.5 7.1 9.2  NEUTROABS 8.3*  --   --   --   HGB 10.1* 9.4* 10.1* 8.8*  HCT 32.4* 28.7* 30.1* 26.8*  MCV 85.9 81.8 80.5 81.2  PLT 388 444* 453* 466*    Basic Metabolic Panel: Recent Labs  Lab 07/06/20 0039 07/06/20 0128 07/06/20 0750 07/07/20 0326 07/08/20 0347 07/09/20 0334  NA 124*  --  126* 126* 129* 129*  K 5.3*  --  5.0 5.0 4.8 4.5  CL 86*  --  87* 91* 96* 94*  CO2 17*  --  21* 20* 20* 21*  GLUCOSE 94  --  93 98 95 108*  BUN 95*  --  96* 96* 81* 77*  CREATININE 6.26*  --  6.56* 6.25* 5.50* 5.37*  CALCIUM 7.6*  --  7.3* 6.9* 7.2* 7.3*  MG  --  1.1*  --   --   --   --   PHOS  --  9.1*  --  7.7* 7.4* 5.5*    GFR: Estimated Creatinine Clearance:  18.1 mL/min (A) (by C-G formula based on SCr of 5.37 mg/dL (H)).  Liver Function Tests: Recent Labs  Lab 07/06/20 0039 07/07/20 0326 07/08/20 0347 07/09/20 0334  AST 18  --   --   --   ALT 13  --   --   --   ALKPHOS 74  --   --   --   BILITOT 0.7  --   --   --   PROT 7.0  --   --   --   ALBUMIN 3.3* 3.0* 2.8* 2.7*      Recent Results (from the past 240 hour(s))  Urine Culture     Status: Abnormal   Collection Time: 07/06/20 12:39 AM   Specimen: Urine, Random  Result Value Ref Range Status   Specimen Description   Final    URINE, RANDOM Performed at [facility], [place] [address], [place], [place] [address]    Special Requests   Final    NONE Performed at [facility], [place] [address] [place], [place] [address]    Culture (A)  Final    <10,000 COLONIES/mL INSIGNIFICANT GROWTH Performed at [place] [address] [place], [place] [address]    Report Status 07/07/2020 FINAL  Final  Culture, blood (Routine X 2) w Reflex to ID Panel     Status: Abnormal   Collection Time: 07/06/20  3:55 AM   Specimen: BLOOD  Result Value Ref Range Status   Specimen Description   Final    BLOOD BLOOD LEFT FOREARM Performed at [facility] [address], [place], [place] [address]    Special Requests   Final    BOTTLES DRAWN AEROBIC AND ANAEROBIC Blood Culture adequate volume Performed at [facility], [address] [place], [place] [address]    Culture  Setup Time   Final    AEROBIC BOTTLE ONLY GRAM POSITIVE COCCI CRITICAL VALUE NOTED.  VALUE IS CONSISTENT WITH PREVIOUSLY REPORTED AND CALLED VALUE.    Culture (A)  Final    STAPHYLOCOCCUS HOMINIS THE SIGNIFICANCE OF ISOLATING THIS ORGANISM FROM A SINGLE SET OF BLOOD CULTURES WHEN MULTIPLE SETS ARE DRAWN IS UNCERTAIN. PLEASE NOTIFY THE MICROBIOLOGY DEPARTMENT WITHIN ONE WEEK IF SPECIATION AND SENSITIVITIES ARE REQUIRED. Performed at [facility], [place] [address], [place], [place] [address]    Report Status 07/08/2020 FINAL  Final  Resp Panel by RT-PCR (Flu A&B, Covid) Nasopharyngeal Swab     Status: None   Collection Time: 07/06/20  3:57 AM   Specimen: Nasopharyngeal Swab; Nasopharyngeal(NP) swabs in vial transport medium  Result Value Ref Range Status   SARS Coronavirus 2 by RT PCR NEGATIVE NEGATIVE Final    Comment: (NOTE) SARS-CoV-2 target nucleic acids are NOT DETECTED.  The SARS-CoV-2 RNA is generally detectable in upper respiratory specimens during the acute phase of infection. The lowest concentration of SARS-CoV-2 viral copies this assay can detect is 138 copies/mL. A negative result does not preclude SARS-Cov-2 infection and should not be used as the sole basis for treatment or other patient management decisions. A negative result may occur with  improper specimen collection/handling, submission of specimen other than nasopharyngeal swab, presence of viral mutation(s) within the areas targeted by this assay, and inadequate number of viral copies(<138 copies/mL). A negative result must be combined with clinical observations, patient history, and epidemiological information. The expected result is Negative.  Fact Sheet for Patients:  EntrepreneurPulse.com.au  Fact Sheet for Healthcare Providers:  IncredibleEmployment.be  This test is no t yet approved or cleared by the [place] FDA and  has been authorized for detection and/or diagnosis of SARS-CoV-2 by FDA under an Emergency Use Authorization (EUA). This EUA will remain  in effect (meaning this test can be used) for the duration of the COVID-19 declaration under Section 564(b)(1) of the Act, 21 U.S.C.section 360bbb-3(b)(1), unless the authorization is terminated  or revoked sooner.       Influenza A by PCR NEGATIVE NEGATIVE Final   Influenza B by PCR NEGATIVE NEGATIVE Final    Comment: (NOTE) The Xpert Xpress  SARS-CoV-2/FLU/RSV plus assay is intended as an aid in the diagnosis of influenza from Nasopharyngeal swab specimens and should not be used as a sole basis for treatment. Nasal washings and aspirates are unacceptable for Xpert Xpress SARS-CoV-2/FLU/RSV testing.  Fact Sheet for Patients: EntrepreneurPulse.com.au  Fact Sheet for Healthcare Providers: IncredibleEmployment.be  This test is not yet approved or cleared by the [place] FDA and has been authorized for detection and/or diagnosis of SARS-CoV-2 by FDA under an Emergency Use Authorization (EUA). This EUA will remain in effect (meaning this test can be used) for the duration of the COVID-19 declaration under Section 564(b)(1) of the Act, 21 U.S.C. section 360bbb-3(b)(1), unless the authorization is terminated or revoked.  Performed at [facility], [place] [address], [place], [place] [address]   Culture, blood (Routine X 2) w Reflex to ID Panel     Status: Abnormal (Preliminary result)   Collection Time: 07/06/20  4:00 AM   Specimen: BLOOD  Result Value Ref Range Status   Specimen Description   Final    BLOOD BLOOD RIGHT FOREARM Performed at [facility] [address] [place], [place] [address]  Special Requests   Final    BOTTLES DRAWN AEROBIC AND ANAEROBIC Blood Culture adequate volume Performed at [facility] [address], [place], [place] [address]    Culture  Setup Time   Final    IN BOTH AEROBIC AND ANAEROBIC BOTTLES GRAM POSITIVE COCCI CRITICAL RESULT CALLED TO, READ BACK BY AND VERIFIED WITH: PHARMD SCOTT C. [id number] FCP    Culture (A)  Final    STAPHYLOCOCCUS EPIDERMIDIS THE SIGNIFICANCE OF ISOLATING THIS ORGANISM FROM A SINGLE SET OF BLOOD CULTURES WHEN MULTIPLE SETS ARE DRAWN IS UNCERTAIN. PLEASE NOTIFY THE MICROBIOLOGY DEPARTMENT WITHIN ONE WEEK IF SPECIATION AND SENSITIVITIES ARE REQUIRED. Performed at  [facility], [place] [address] [place], [place] [address]    Report Status PENDING  Incomplete  Blood Culture ID Panel (Reflexed)     Status: Abnormal   Collection Time: 07/06/20  4:00 AM  Result Value Ref Range Status   Enterococcus faecalis NOT DETECTED NOT DETECTED Final   Enterococcus Faecium NOT DETECTED NOT DETECTED Final   Listeria monocytogenes NOT DETECTED NOT DETECTED Final   Staphylococcus species DETECTED (A) NOT DETECTED Final    Comment: CRITICAL RESULT CALLED TO, READ BACK BY AND VERIFIED WITH: PHARMD SCOTT C. BP:[id number] FCP    Staphylococcus aureus (BCID) NOT DETECTED NOT DETECTED Final   Staphylococcus epidermidis DETECTED (A) NOT DETECTED Final   Staphylococcus lugdunensis NOT DETECTED NOT DETECTED Final   Streptococcus species NOT DETECTED NOT DETECTED Final   Streptococcus agalactiae NOT DETECTED NOT DETECTED Final   Streptococcus pneumoniae NOT DETECTED NOT DETECTED Final   Streptococcus pyogenes NOT DETECTED NOT DETECTED Final   A.calcoaceticus-baumannii NOT DETECTED NOT DETECTED Final   Bacteroides fragilis NOT DETECTED NOT DETECTED Final   Enterobacterales NOT DETECTED NOT DETECTED Final   Enterobacter cloacae complex NOT DETECTED NOT DETECTED Final   Escherichia coli NOT DETECTED NOT DETECTED Final   Klebsiella aerogenes NOT DETECTED NOT DETECTED Final   Klebsiella oxytoca NOT DETECTED NOT DETECTED Final   Klebsiella pneumoniae NOT DETECTED NOT DETECTED Final   Proteus species NOT DETECTED NOT DETECTED Final   Salmonella species NOT DETECTED NOT DETECTED Final   Serratia marcescens NOT DETECTED NOT DETECTED Final   Haemophilus influenzae NOT DETECTED NOT DETECTED Final   Neisseria meningitidis NOT DETECTED NOT DETECTED Final   Pseudomonas aeruginosa NOT DETECTED NOT DETECTED Final   Stenotrophomonas maltophilia NOT DETECTED NOT DETECTED Final   Candida albicans NOT DETECTED NOT DETECTED Final   Candida auris NOT DETECTED NOT DETECTED Final   Candida  glabrata NOT DETECTED NOT DETECTED Final   Candida krusei NOT DETECTED NOT DETECTED Final   Candida parapsilosis NOT DETECTED NOT DETECTED Final   Candida tropicalis NOT DETECTED NOT DETECTED Final   Cryptococcus neoformans/gattii NOT DETECTED NOT DETECTED Final   Methicillin resistance mecA/C NOT DETECTED NOT DETECTED Final    Comment: Performed at [facility], [address] [place], [place] [address]  MRSA PCR Screening     Status: None   Collection Time: 07/06/20  3:33 PM   Specimen: Nasal Mucosa; Nasopharyngeal  Result Value Ref Range Status   MRSA by PCR NEGATIVE NEGATIVE Final    Comment:        The GeneXpert MRSA Assay (FDA approved for NASAL specimens only), is one component of a comprehensive MRSA colonization surveillance program. It is not intended to diagnose MRSA infection nor to guide or monitor treatment for MRSA infections. Performed at [facility],  [place] [address] [place], [place] [address]       Radiology Studies: No results found.     LOS: 3 days   Abron Neddo [employer] on [email or web address]  07/09/2020, 11:30 AM

## 2020-07-09 NOTE — Progress Notes (Signed)
  Echocardiogram 2D Echocardiogram has been performed.  Brian Silva G Brian Silva 07/09/2020, 10:21 AM

## 2020-07-09 NOTE — TOC Initial Note (Signed)
Transition of Care [facility]) - Initial/Assessment Note    Patient Details  Name: Brian Silva MRN: [id number] Date of Birth: 18-Jun-1940  [facility] CM/SW Contact:    Ross Ludwig, LCSW Phone Number: 07/09/2020, 4:12 PM  Clinical Narrative:                  Patient is a 80 year old male who is alert and oriented.  Patient is from [facility] SNF which he has been since June last year.  Patient did not hold his bed at SNF.  Patient does not have any other family members except for a friend of his who is a PT.  Patient is still paying rent on his apartment, and also paying his car insurance.  Patient tried to go back home a few weeks ago, but was unsuccessful at staying at home.  Patient spoke to his friend Minna Merritts, due to patient having some confusion.  CSW will look at other SNFs in case [facility] does not have a bed available.  Nephrology is seeing patient and he may need dialysis, at this time nephrology is monitoring patient.  CSW to continue to follow patient's progress throughout discharge planning.  Expected Discharge Plan: Skilled Nursing Facility Barriers to Discharge: Continued Medical Work up   Patient Goals and CMS Choice Patient states their goals for this hospitalization and ongoing recovery are:: To return to a SNF under short term rehab and then transition to LTC. CMS Medicare.gov Compare Post Acute Care list provided to:: Patient Represenative (must comment) Choice offered to / list presented to : [place] / Guardian,Patient  Expected Discharge Plan and Services Expected Discharge Plan: [place] Choice: [place] Living arrangements for the past 2 months: [place]                                      Prior Living Arrangements/Services Living arrangements for the past 2 months: [place] Lives with:: Facility Resident Patient language and need for interpreter  reviewed:: Yes Do you feel safe going back to the place where you live?: Yes      Need for Family Participation in Patient Care: No (Comment) Care giver support system in place?: Yes (comment)   Criminal Activity/Legal Involvement Pertinent to Current Situation/Hospitalization: No - Comment as needed  Activities of Daily Living Home Assistive Devices/Equipment: Blood pressure cuff,CBG Meter,Hospital bed,Hoyer Lift,Scales,Wheelchair ADL Screening (condition at time of admission) Patient's cognitive ability adequate to safely complete daily activities?: No Is the patient deaf or have difficulty hearing?: No Does the patient have difficulty seeing, even when wearing glasses/contacts?: No Does the patient have difficulty concentrating, remembering, or making decisions?: Yes Patient able to express need for assistance with ADLs?: Yes Does the patient have difficulty dressing or bathing?: Yes Independently performs ADLs?: No Communication: Independent Dressing (OT): Needs assistance Is this a change from baseline?: Pre-admission baseline Grooming: Needs assistance Is this a change from baseline?: Pre-admission baseline Feeding: Needs assistance Is this a change from baseline?: Pre-admission baseline Bathing: Needs assistance Is this a change from baseline?: Pre-admission baseline Toileting: Needs assistance Is this a change from baseline?: Pre-admission baseline In/Out Bed: Needs assistance Is this a change from baseline?: Pre-admission baseline Walks in Home: Needs assistance Is this a change from baseline?: Pre-admission baseline Does the patient have difficulty walking or climbing stairs?: Yes (  secondary to weakness) Weakness of Legs: Both Weakness of Arms/Hands: None  Permission Sought/Granted Permission sought to share information with : Facility Contact Representative,Family Supports Permission granted to share information with : Yes, Release of Information Signed  Share  Information with NAME: Gwenlyn Perking [phone]  [phone]  Permission granted to share info w AGENCY: SNF admissions        Emotional Assessment Appearance:: Appears stated age   Affect (typically observed): Calm,Accepting Orientation: : Oriented to Self,Oriented to Place,Oriented to Situation Alcohol / Substance Use: Not Applicable Psych Involvement: No (comment)  Admission diagnosis:  AKI (acute kidney injury) ([place]) [N17.9] Acute kidney injury ([place]) [N17.9] Pneumonia of both lower lobes due to infectious organism [J18.9] Acute renal failure superimposed on stage 2 chronic kidney disease ([place]) [N17.9, N18.2] Patient Active Problem List   Diagnosis Date Noted  . Bacteremia   . Acute renal failure superimposed on stage 2 chronic kidney disease ([place]) 07/06/2020  . Metabolic acidosis [id number]  . Uremia 07/06/2020  . Hyperkalemia 07/06/2020  . Chronic diastolic CHF (congestive heart failure) ([place]) 07/06/2020  . Pneumonia of both lower lobes due to infectious organism 12/23/2019  . CAP (community acquired pneumonia) 12/22/2019  . Compression fracture of body of thoracic vertebra ([place]) 09/10/2019  . Fall at home, initial encounter 09/10/2019  . AKI (acute kidney injury) ([place]) 01/17/2018  . Syncope, vasovagal 01/17/2018  . Diabetes mellitus type II, non insulin dependent ([place]) 01/17/2018  . Hyponatremia 01/17/2018  . Syncope 01/17/2018  . Depression 10/25/2015  . Hyperlipidemia 10/25/2015  . TINEA PEDIS 01/04/2010  . TINEA VERSICOLOR 01/04/2010  . URI 01/04/2010  . HYPERGLYCEMIA 12/02/2008  . ABDOMINAL WALL HERNIA 12/01/2007  . OBESITY, MORBID 04/04/2007  . SLEEP APNEA, OBSTRUCTIVE 04/04/2007  . LOW BACK PAIN, CHRONIC 12/07/2004  . HYPERLIPIDEMIA 08/12/2002  . ALLERGIC RHINITIS, SEASONAL 08/12/2002  . Essential hypertension 04/01/2000  . Asthma 10/11/1997  . SUPERFICIAL PHLEBITIS 05/06/1990   PCP:  Benito Mccreedy, MD Pharmacy:   [id number]  [place], [place]. [place] [address] Phone: [phone] Fax: [phone]     Social Determinants of Health (SDOH) Interventions    Readmission Risk Interventions No flowsheet data found.

## 2020-07-09 NOTE — Progress Notes (Addendum)
  [facility] Progress Note    Assessment/ Plan:    1.Acuterenal injury. His baseline serum creatinine is within normal range as of September 2021. Patient was using the ACE inhibitor lisinopril as well as Lasix in the setting of volume depletion and diarrhea.Urine sediment is blandno evidence of acute GN or acute interstitial nephritis. CT scan does not reveal any evidence of obstruction. This looks like hemodynamically mediated hypotensive episode leading to acute kidney injury--> suspect ATN.He is getting better and it appears electrolytes are improving.  Has some LE edema so will leave off the IVFs for now.     2. Hypertension/volume: off IVFs for now  3.Hyponatremia   stable, expect to improve with improved Cr  4.Hyperkalemia: improved  5.MSSE bacteremia: likely contaminant- antibiotics d/c today  6.Diabetes mellitus: per primary  7.  Anemia: iron deficient- will give feraheme today  8.  Dispo: pending  Subjective:    Cr still improving.  Adequate UOP of about 1650 mL yesterday.     Objective:   BP (!) 124/51 (BP Location: Right Arm)   Pulse 84   Temp 98.8 F (37.1 C) (Oral)   Resp 16   Ht '5\' 10"'$  (1.778 m)   Wt (!) 167.8 kg   SpO2 96%   BMI 53.08 kg/m   Intake/Output Summary (Last 24 hours) at 07/09/2020 1234 Last data filed at 07/09/2020 0854 Gross per 24 hour  Intake 952 ml  Output 1650 ml  Net -698 ml   Weight change: -0.1 kg  Physical Exam: Gen: NAD, sitting in bed CVS: RRR Resp: clear Abd: soft Ext: 1+ LE edema   Imaging: No results found.  Labs: BMET Recent Labs  Lab 07/06/20 0039 07/06/20 0128 07/06/20 0750 07/07/20 0326 07/08/20 0347 07/09/20 0334  NA 124*  --  126* 126* 129* 129*  K 5.3*  --  5.0 5.0 4.8 4.5  CL 86*  --  87* 91* 96* 94*  CO2 17*  --  21* 20* 20* 21*  GLUCOSE 94  --  93 98 95 108*  BUN 95*  --  96* 96* 81* 77*  CREATININE 6.26*  --  6.56* 6.25* 5.50* 5.37*  CALCIUM 7.6*  --  7.3*  6.9* 7.2* 7.3*  PHOS  --  9.1*  --  7.7* 7.4* 5.5*   CBC Recent Labs  Lab 07/06/20 0039 07/07/20 0326 07/08/20 0347 07/09/20 0334  WBC 10.5 7.5 7.1 9.2  NEUTROABS 8.3*  --   --   --   HGB 10.1* 9.4* 10.1* 8.8*  HCT 32.4* 28.7* 30.1* 26.8*  MCV 85.9 81.8 80.5 81.2  PLT 388 444* 453* 466*    Medications:    . aspirin EC  81 mg Oral Daily  . atorvastatin  20 mg Oral QHS  . folic acid  1 mg Oral Daily  . heparin  5,000 Units Subcutaneous Q8H  . melatonin  3 mg Oral QHS  . vitamin B-12  500 mcg Oral Daily      Madelon Lips, MD 07/09/2020, 12:34 PM

## 2020-07-09 NOTE — Progress Notes (Signed)
Patient provided total ADL and peri care x4 today with four bowel movements with Mod assist x3.

## 2020-07-10 LAB — RENAL FUNCTION PANEL
Albumin: 2.6 g/dL — ABNORMAL LOW (ref 3.5–5.0)
Anion gap: 13 (ref 5–15)
BUN: 67 mg/dL — ABNORMAL HIGH (ref 8–23)
CO2: 20 mmol/L — ABNORMAL LOW (ref 22–32)
Calcium: 7.9 mg/dL — ABNORMAL LOW (ref 8.9–10.3)
Chloride: 97 mmol/L — ABNORMAL LOW (ref 98–111)
Creatinine, Ser: 4.93 mg/dL — ABNORMAL HIGH (ref 0.61–1.24)
GFR, Estimated: 11 mL/min — ABNORMAL LOW (ref >60)
Glucose, Bld: 107 mg/dL — ABNORMAL HIGH (ref 70–99)
Phosphorus: 5.1 mg/dL — ABNORMAL HIGH (ref 2.5–4.6)
Potassium: 4.7 mmol/L (ref 3.5–5.1)
Sodium: 130 mmol/L — ABNORMAL LOW (ref 135–145)

## 2020-07-10 LAB — CBC
HCT: 27.2 % — ABNORMAL LOW (ref 39.0–52.0)
Hemoglobin: 9 g/dL — ABNORMAL LOW (ref 13.0–17.0)
MCH: 26.9 pg (ref 26.0–34.0)
MCHC: 33.1 g/dL (ref 30.0–36.0)
MCV: 81.2 fL (ref 80.0–100.0)
Platelets: 496 10*3/uL — ABNORMAL HIGH (ref 150–400)
RBC: 3.35 MIL/uL — ABNORMAL LOW (ref 4.22–5.81)
RDW: 13.8 % (ref 11.5–15.5)
WBC: 9.4 10*3/uL (ref 4.0–10.5)
nRBC: 0 % (ref 0.0–0.2)

## 2020-07-10 MED ORDER — GUAIFENESIN-DM 100-10 MG/5ML PO SYRP
5.0000 mL | ORAL_SOLUTION | ORAL | Status: DC | PRN
  Administered 2020-07-10 – 2020-07-11 (×4): 5 mL via ORAL
  Filled 2020-07-10 (×4): qty 10

## 2020-07-10 MED ORDER — AMLODIPINE BESYLATE 5 MG PO TABS
5.0000 mg | ORAL_TABLET | Freq: Every day | ORAL | Status: DC
  Administered 2020-07-10 – 2020-07-11 (×2): 5 mg via ORAL
  Filled 2020-07-10 (×2): qty 1

## 2020-07-10 NOTE — Progress Notes (Signed)
[facility] Progress Note    Assessment/ Plan:    1.Acuterenal injury. His baseline serum creatinine is within normal range as of September 2021. Patient was using the ACE inhibitor lisinopril as well as Lasix in the setting of volume depletion and diarrhea.Urine sediment is blandno evidence of acute GN or acute interstitial nephritis. CT scan does not reveal any evidence of obstruction. This looks like hemodynamically mediated hypotensive episode leading to acute kidney injury--> suspect ATN.He is getting better and it appears electrolytes are improving.  No need for dialysis.  He states he does not have a kidney doctor so we will get him followup with [name] in about 3-4 weeks.  He appears to be autodiuresing so I would hold torsemide for now until ~ 1 week after discharge.  Continue to hold lisinopril  2. Hypertension/volume: off IVFs for now  3.Hyponatremia  improving  4.Hyperkalemia: resolved  5.MSSE bacteremia: likely contaminant- antibiotics d/c today  6.Diabetes mellitus: per primary  7.  Anemia: iron deficient- s/p feraheme 510 m g IV 3/19  8.  Dispo: Ok from renal perspective at any time.    Subjective:    UOP picking up, doing well, Cr continues to downtrend.   Objective:   BP (!) 163/78 (BP Location: Right Wrist)   Pulse 82   Temp 98.4 F (36.9 C)   Resp 20   Ht '5\' 10"'$  (1.778 m)   Wt (!) 167.9 kg   SpO2 97%   BMI 53.11 kg/m   Intake/Output Summary (Last 24 hours) at 07/10/2020 1126 Last data filed at 07/10/2020 [id number] Gross per 24 hour  Intake 834.11 ml  Output 2020 ml  Net -1185.89 ml   Weight change: 0.1 kg  Physical Exam: Gen: NAD, sitting in bed CVS: RRR Resp: clear Abd: soft Ext: 1+ LE edema   Imaging: ECHOCARDIOGRAM COMPLETE  Result Date: 07/09/2020    ECHOCARDIOGRAM REPORT   Patient Name:   Brian Silva Date of Exam: 07/09/2020 Medical Rec #:  [id number]        Height:       70.0 in Accession #:    [id number]        Weight:       369.9 lb Date of Birth:  1940/07/17        BSA:          2.711 m Patient Age:    80 years         BP:           124/51 mmHg Patient Gender: M                HR:           83 bpm. Exam Location:  Inpatient Procedure: 2D Echo, Cardiac Doppler, Color Doppler and Intracardiac            Opacification Agent Indications:    Bacteremia R78.81  History:        Patient has prior history of Echocardiogram examinations, most                 recent 09/11/2019. Risk Factors:Hypertension, Dyslipidemia and                 Sleep Apnea. Chronic Respiratory Failure.  Sonographer:    Tiffany Dance Referring Phys: [id number]  Sonographer Comments: Patient is morbidly obese, Technically difficult study due to poor echo windows, no parasternal window and suboptimal apical window. IMPRESSIONS  1. Images are quite limited. Definity contrast  utilized.  2. Study is not adequate to assess for valvular vegetations.  3. Left ventricular ejection fraction, by estimation, is 65 to 70%. The left ventricle has normal function. Left ventricular endocardial border not optimally defined to evaluate regional wall motion. There is mild left ventricular hypertrophy. Left ventricular diastolic parameters are indeterminate.  4. Right ventricular systolic function is normal. The right ventricular size is moderately enlarged. Tricuspid regurgitation signal is inadequate for assessing PA pressure.  5. The mitral valve was not well visualized. No evidence of mitral valve regurgitation.  6. The aortic valve is tricuspid. There is mild calcification of the aortic valve. Aortic valve regurgitation is not visualized.  7. The inferior vena cava is normal in size with greater than 50% respiratory variability, suggesting right atrial pressure of 3 mmHg. FINDINGS  Left Ventricle: Left ventricular ejection fraction, by estimation, is 65 to 70%. The left ventricle has normal function. Left ventricular endocardial border not optimally defined to  evaluate regional wall motion. Definity contrast agent was given IV to delineate the left ventricular endocardial borders. The left ventricular internal cavity size was normal in size. There is mild left ventricular hypertrophy. Left ventricular diastolic parameters are indeterminate. Right Ventricle: The right ventricular size is moderately enlarged. No increase in right ventricular wall thickness. Right ventricular systolic function is normal. Tricuspid regurgitation signal is inadequate for assessing PA pressure. Left Atrium: Left atrial size was normal in size. Right Atrium: Right atrial size was not well visualized. Pericardium: There is no evidence of pericardial effusion. Presence of pericardial fat pad. Mitral Valve: The mitral valve was not well visualized. No evidence of mitral valve regurgitation. Tricuspid Valve: The tricuspid valve is not well visualized. Tricuspid valve regurgitation is trivial. Aortic Valve: The aortic valve is tricuspid. There is mild calcification of the aortic valve. Aortic valve regurgitation is not visualized. Pulmonic Valve: The pulmonic valve was not assessed. Pulmonic valve regurgitation is not visualized. Aorta: The aortic root was not well visualized. Venous: The inferior vena cava is normal in size with greater than 50% respiratory variability, suggesting right atrial pressure of 3 mmHg. IAS/Shunts: No atrial level shunt detected by color flow Doppler.   Diastology LV e' medial:    5.98 cm/s LV E/e' medial:  14.7 LV e' lateral:   4.13 cm/s LV E/e' lateral: 21.3  RIGHT VENTRICLE             IVC RV Basal diam:  3.20 cm     IVC diam: 2.10 cm RV Mid diam:    3.10 cm RV S prime:     14.30 cm/s TAPSE (M-mode): 1.6 cm LEFT ATRIUM           Index       RIGHT ATRIUM           Index LA Vol (A4C): 41.1 ml 15.16 ml/m RA Area:     17.10 cm                                   RA Volume:   44.80 ml  16.53 ml/m  AORTIC VALVE LVOT Vmax:   105.00 cm/s LVOT Vmean:  74.300 cm/s LVOT VTI:     0.201 m MITRAL VALVE MV Area (PHT): 3.37 cm    SHUNTS MV Decel Time: 225 msec    Systemic VTI: 0.20 m MV E velocity: 88.10 cm/s MV A velocity: 95.20 cm/s MV E/A ratio:  0.93 Mikeal Hawthorne  Mcdowell MD Electronically signed by Rozann Lesches MD Signature Date/Time: 07/09/2020/2:05:24 PM    Final     Labs: BMET Recent Labs  Lab 07/06/20 [id number] 07/06/20 0128 07/06/20 [id number] 07/07/20 [id number] 07/08/20 0347 07/09/20 0334 07/10/20 0252  NA 124*  --  126* 126* 129* 129* 130*  K 5.3*  --  5.0 5.0 4.8 4.5 4.7  CL 86*  --  87* 91* 96* 94* 97*  CO2 17*  --  21* 20* 20* 21* 20*  GLUCOSE 94  --  93 98 95 108* 107*  BUN 95*  --  96* 96* 81* 77* 67*  CREATININE 6.26*  --  6.56* 6.25* 5.50* 5.37* 4.93*  CALCIUM 7.6*  --  7.3* 6.9* 7.2* 7.3* 7.9*  PHOS  --  9.1*  --  7.7* 7.4* 5.5* 5.1*   CBC Recent Labs  Lab 07/06/20 0039 07/07/20 0326 07/08/20 0347 07/09/20 0334 07/10/20 0632  WBC 10.5 7.5 7.1 9.2 9.4  NEUTROABS 8.3*  --   --   --   --   HGB 10.1* 9.4* 10.1* 8.8* 9.0*  HCT 32.4* 28.7* 30.1* 26.8* 27.2*  MCV 85.9 81.8 80.5 81.2 81.2  PLT 388 444* 453* 466* 496*    Medications:    . amLODipine  5 mg Oral Daily  . aspirin EC  81 mg Oral Daily  . atorvastatin  20 mg Oral QHS  . folic acid  1 mg Oral Daily  . guaiFENesin  600 mg Oral BID  . heparin  5,000 Units Subcutaneous Q8H  . melatonin  3 mg Oral QHS  . vitamin B-12  500 mcg Oral Daily      Madelon Lips, MD 07/10/2020, 11:26 AM

## 2020-07-10 NOTE — Progress Notes (Addendum)
[facility] PROGRESS NOTE   Brian Silva [id number] DOB: 11-14-1940 DOA: 07/06/2020  PCP: Benito Mccreedy, MD  Brief History/Interval Summary: 80 y.o. male with medical history significant for type 2 diabetes mellitus, hypertension, OSA, history of TIA, and mild chronic renal insufficiency, presented to the emergency department from his SNF for evaluation of abnormal blood work.    About 2 weeks prior to admission patient was experiencing nausea vomiting and diarrhea.  That lasted about 3 days.  Blood work at the skilled nursing facility revealed worsening in his renal function and he was sent over to the ED.  Subsequently hospitalized.    Consultants:  Nephrology Infectious disease  Procedures:  Transthoracic echocardiogram 3/19 1. Images are quite limited. Definity contrast utilized.  2. Study is not adequate to assess for valvular vegetations.  3. Left ventricular ejection fraction, by estimation, is 65 to 70%. The  left ventricle has normal function. Left ventricular endocardial border  not optimally defined to evaluate regional wall motion. There is mild left  ventricular hypertrophy. Left  ventricular diastolic parameters are indeterminate.  4. Right ventricular systolic function is normal. The right ventricular  size is moderately enlarged. Tricuspid regurgitation signal is inadequate  for assessing PA pressure.  5. The mitral valve was not well visualized. No evidence of mitral valve  regurgitation.  6. The aortic valve is tricuspid. There is mild calcification of the  aortic valve. Aortic valve regurgitation is not visualized.  7. The inferior vena cava is normal in size with greater than 50%  respiratory variability, suggesting right atrial pressure of 3 mmHg.   Antibiotics: Anti-infectives (From admission, onward)   Start     Dose/Rate Route Frequency Ordered Stop   07/07/20 1200  ceFAZolin (ANCEF) IVPB 2g/100 mL premix  Status:  Discontinued         2 g 200 mL/hr over 30 Minutes Intravenous Every 12 hours 07/07/20 1001 07/08/20 1451   07/07/20 0600  cefTRIAXone (ROCEPHIN) 2 g in sodium chloride 0.9 % 100 mL IVPB  Status:  Discontinued        2 g 200 mL/hr over 30 Minutes Intravenous Every 24 hours 07/06/20 0627 07/07/20 1000   07/07/20 0600  azithromycin (ZITHROMAX) 500 mg in sodium chloride 0.9 % 250 mL IVPB  Status:  Discontinued        500 mg 250 mL/hr over 60 Minutes Intravenous Every 24 hours 07/06/20 0627 07/07/20 1000   07/06/20 0400  cefTRIAXone (ROCEPHIN) 1 g in sodium chloride 0.9 % 100 mL IVPB        1 g 200 mL/hr over 30 Minutes Intravenous  Once 07/06/20 0353 07/06/20 0447   07/06/20 0400  azithromycin (ZITHROMAX) 500 mg in sodium chloride 0.9 % 250 mL IVPB        500 mg 250 mL/hr over 60 Minutes Intravenous  Once 07/06/20 0353 07/06/20 [id number]      Subjective/Interval History: Patient mentions that he slept better last night compared to the night before.  Denies any shortness of breath or chest pain.  Has been urinating.  Has been having bowel movements.     Assessment/Plan:  Acute kidney injury superimposed on chronic kidney disease stage II with mild hyperkalemia/metabolic acidosis Patient was significantly elevated BUN and creatinine of 35 and 6.26 at admission.  Potassium was 5.3.  Baseline renal function is close to 1.5.  This could have been triggered by his GI symptoms 2 weeks prior to admission and then worsened in the setting of  Lasix as well as ACE inhibitor.  UA noted to be bland.  Urine electrolyte studies noted.  Noncontrast CT scan did not show any hydronephrosis.  ACE inhibitor on hold along with the Lasix which is also on hold.  ATN is considered a possibility. Nephrology continues to follow.  Renal function is slowly improving.  Continue to monitor urine output.  He is off of IV fluids.  Bacteremia, likely contaminant Patient growing staph hominis and staph epidermidis.  Infectious disease was  consulted.  Patient was noted to be febrile.  He had normal WBC.  Does not have any infected wounds.  Does not have any Port-A-Cath or other foreign objects.  This is thought to be a contaminant.  Antibiotics were discontinued yesterday.  Echocardiogram was a limited study due to body habitus.  Normal systolic function noted.  Valves could not be clearly evaluated for vegetations.  Will not pursue this further since we think that his bacteremia was due to contamination.  He has been afebrile.  Questionable pneumonia Apparently has had a cough for 2 weeks or so.  Patient was initially started on ceftriaxone and azithromycin.  Changed over to cefazolin.  Currently off of antibiotics.  Patient did have some cough yesterday which was controlled with Mucinex.  Saturating normal on room air. Influenza and COVID-19 PCR negative.  Does not have any oxygen requirements currently.  Hyponatremia Could be due to diuretics.  Sodium level has improved and stable.    Essential hypertension Blood pressure noted to be in the hypertensive range over the last 24 hours.  His home medications were placed on hold.  He was noted to be on amlodipine, furosemide, lisinopril.  Continue to hold furosemide and lisinopril.  We will reinitiate amlodipine at a lower dose.  Chronic diastolic CHF Systolic function was preserved in 2021.  Holding furosemide and lisinopril.  Watch closely for signs of volume overload.  Normocytic anemia/folic acid deficiency Likely anemia of chronic disease.  Anemia panel reviewed.  No clear evidence for iron deficiency.  B12 level was borderline low at 241.  Folic acid low at 5.2.  Both of these initiated. Hemoglobin is stable for the most part.  Slight drop was due to dilution.  No overt bleeding noted.  History of TIA Continue aspirin and statin.  MASD Sacral area Wound care has seen.  Obstructive sleep apnea Patient mentions that he has not been using CPAP at all prior to admission.  Has  been noncompliant here in the hospital.  CPAP was discontinued.  Diabetes mellitus type 2 Noted to be on Metformin at home which is currently on hold.  HbA1c was 5.7 in September.  Reasonable at this time to hold off on SSI.  Check glucose daily.  Recent back injury/T12 compression fracture Apparently had a fall with back injury.  That is the reason why he is in SNF for rehab.  Did not need to undergo any surgery per patient.  PT and OT evaluation.    Morbid obesity Estimated body mass index is 53.11 kg/m as calculated from the following:   Height as of this encounter: '5\' 10"'$  (1.778 m).   Weight as of this encounter: 167.9 kg.   DVT Prophylaxis: Subcutaneous heparin Code Status: Full code Family Communication: Discussed with the patient Disposition Plan: Hopefully back to SNF when improved  Status is: Inpatient  Remains inpatient appropriate because:IV treatments appropriate due to intensity of illness or inability to take PO and Inpatient level of care appropriate due to severity  of illness   Dispo: The patient is from: SNF              Anticipated d/c is to: SNF              Patient currently is not medically stable to d/c.   Difficult to place patient No      Medications:  Scheduled: . aspirin EC  81 mg Oral Daily  . atorvastatin  20 mg Oral QHS  . folic acid  1 mg Oral Daily  . guaiFENesin  600 mg Oral BID  . heparin  5,000 Units Subcutaneous Q8H  . melatonin  3 mg Oral QHS  . vitamin B-12  500 mcg Oral Daily   Continuous:  [id number] **OR** acetaminophen, guaiFENesin-dextromethorphan, ondansetron **OR** ondansetron (ZOFRAN) IV   Objective:  Vital Signs  Vitals:   07/09/20 0600 07/09/20 1414 07/09/20 2023 07/10/20 0500  BP:  (!) 148/66 (!) 160/58 (!) 163/78  Pulse:  89 83 82  Resp:  16 (!) 21 20  Temp:  97.7 F (36.5 C) 98 F (36.7 C) 98.4 F (36.9 C)  TempSrc:   Oral   SpO2:  99% 99% 97%  Weight: (!) 167.8 kg   (!) 167.9 kg  Height:         Intake/Output Summary (Last 24 hours) at 07/10/2020 0956 Last data filed at 07/10/2020 0834 Gross per 24 hour  Intake 1074.11 ml  Output 2020 ml  Net -945.89 ml   Filed Weights   07/08/20 0411 07/09/20 0600 07/10/20 0500  Weight: (!) 167.9 kg (!) 167.8 kg (!) 167.9 kg    General appearance: Awake alert.  In no distress Resp: Clear to auscultation bilaterally.  Normal effort Cardio: S1-S2 is normal regular.  No S3-S4.  No rubs murmurs or bruit GI: Abdomen is soft.  Nontender nondistended.  Bowel sounds are present normal.  No masses organomegaly Extremities: limited range of motion of lower extremities due to physical deconditioning. Neurologic:  No focal neurological deficits.      Lab Results:  Data Reviewed: I have personally reviewed following labs and imaging studies  CBC: Recent Labs  Lab 07/06/20 0039 07/07/20 0326 07/08/20 0347 07/09/20 0334 07/10/20 0632  WBC 10.5 7.5 7.1 9.2 9.4  NEUTROABS 8.3*  --   --   --   --   HGB 10.1* 9.4* 10.1* 8.8* 9.0*  HCT 32.4* 28.7* 30.1* 26.8* 27.2*  MCV 85.9 81.8 80.5 81.2 81.2  PLT 388 444* 453* 466* 496*    Basic Metabolic Panel: Recent Labs  Lab 07/06/20 0128 07/06/20 0750 07/07/20 0326 07/08/20 0347 07/09/20 0334 07/10/20 0252  NA  --  126* 126* 129* 129* 130*  K  --  5.0 5.0 4.8 4.5 4.7  CL  --  87* 91* 96* 94* 97*  CO2  --  21* 20* 20* 21* 20*  GLUCOSE  --  93 98 95 108* 107*  BUN  --  96* 96* 81* 77* 67*  CREATININE  --  6.56* 6.25* 5.50* 5.37* 4.93*  CALCIUM  --  7.3* 6.9* 7.2* 7.3* 7.9*  MG 1.1*  --   --   --   --   --   PHOS 9.1*  --  7.7* 7.4* 5.5* 5.1*    GFR: Estimated Creatinine Clearance: 19.7 mL/min (A) (by C-G formula based on SCr of 4.93 mg/dL (H)).  Liver Function Tests: Recent Labs  Lab 07/06/20 0039 07/07/20 0326 07/08/20 0347 07/09/20 0334 07/10/20 0252  AST 18  --   --   --   --  ALT 13  --   --   --   --   ALKPHOS 74  --   --   --   --   BILITOT 0.7  --   --   --   --    PROT 7.0  --   --   --   --   ALBUMIN 3.3* 3.0* 2.8* 2.7* 2.6*      Recent Results (from the past 240 hour(s))  Urine Culture     Status: Abnormal   Collection Time: 07/06/20 12:39 AM   Specimen: Urine, Random  Result Value Ref Range Status   Specimen Description   Final    URINE, RANDOM Performed at [facility], [place] [address], [place], [place] [address]    Special Requests   Final    NONE Performed at [facility], [place] [address] [place], [place] [address]    Culture (A)  Final    <10,000 COLONIES/mL INSIGNIFICANT GROWTH Performed at [place] [address], [place], [place] [address]    Report Status 07/07/2020 FINAL  Final  Culture, blood (Routine X 2) w Reflex to ID Panel     Status: Abnormal   Collection Time: 07/06/20  3:55 AM   Specimen: BLOOD  Result Value Ref Range Status   Specimen Description   Final    BLOOD BLOOD LEFT FOREARM Performed at [place] [address], [place], [place] [address]    Special Requests   Final    BOTTLES DRAWN AEROBIC AND ANAEROBIC Blood Culture adequate volume Performed at [place] [address] [place], [place] [address]    Culture  Setup Time   Final    AEROBIC BOTTLE ONLY GRAM POSITIVE COCCI CRITICAL VALUE NOTED.  VALUE IS CONSISTENT WITH PREVIOUSLY REPORTED AND CALLED VALUE.    Culture (A)  Final    STAPHYLOCOCCUS HOMINIS THE SIGNIFICANCE OF ISOLATING THIS ORGANISM FROM A SINGLE SET OF BLOOD CULTURES WHEN MULTIPLE SETS ARE DRAWN IS UNCERTAIN. PLEASE NOTIFY THE MICROBIOLOGY DEPARTMENT WITHIN ONE WEEK IF SPECIATION AND SENSITIVITIES ARE REQUIRED. Performed at [facility], [place] [address] [place], [place] [address]    Report Status 07/08/2020 FINAL  Final  Resp Panel by RT-PCR (Flu A&B, Covid) Nasopharyngeal Swab     Status: None   Collection Time: 07/06/20  3:57 AM   Specimen: Nasopharyngeal Swab; Nasopharyngeal(NP)  swabs in vial transport medium  Result Value Ref Range Status   SARS Coronavirus 2 by RT PCR NEGATIVE NEGATIVE Final    Comment: (NOTE) SARS-CoV-2 target nucleic acids are NOT DETECTED.  The SARS-CoV-2 RNA is generally detectable in upper respiratory specimens during the acute phase of infection. The lowest concentration of SARS-CoV-2 viral copies this assay can detect is 138 copies/mL. A negative result does not preclude SARS-Cov-2 infection and should not be used as the sole basis for treatment or other patient management decisions. A negative result may occur with  improper specimen collection/handling, submission of specimen other than nasopharyngeal swab, presence of viral mutation(s) within the areas targeted by this assay, and inadequate number of viral copies(<138 copies/mL). A negative result must be combined with clinical observations, patient history, and epidemiological information. The expected result is Negative.  Fact Sheet for Patients:  EntrepreneurPulse.com.au  Fact Sheet for Healthcare Providers:  IncredibleEmployment.be  This test is no t yet approved or cleared by the [place] FDA and  has been authorized for detection and/or diagnosis  of SARS-CoV-2 by FDA under an Emergency Use Authorization (EUA). This EUA will remain  in effect (meaning this test can be used) for the duration of the COVID-19 declaration under Section 564(b)(1) of the Act, 21 U.S.C.section 360bbb-3(b)(1), unless the authorization is terminated  or revoked sooner.       Influenza A by PCR NEGATIVE NEGATIVE Final   Influenza B by PCR NEGATIVE NEGATIVE Final    Comment: (NOTE) The Xpert Xpress SARS-CoV-2/FLU/RSV plus assay is intended as an aid in the diagnosis of influenza from Nasopharyngeal swab specimens and should not be used as a sole basis for treatment. Nasal washings and aspirates are unacceptable for Xpert Xpress  SARS-CoV-2/FLU/RSV testing.  Fact Sheet for Patients: EntrepreneurPulse.com.au  Fact Sheet for Healthcare Providers: IncredibleEmployment.be  This test is not yet approved or cleared by the [place] FDA and has been authorized for detection and/or diagnosis of SARS-CoV-2 by FDA under an Emergency Use Authorization (EUA). This EUA will remain in effect (meaning this test can be used) for the duration of the COVID-19 declaration under Section 564(b)(1) of the Act, 21 U.S.C. section 360bbb-3(b)(1), unless the authorization is terminated or revoked.  Performed at [facility], [place] [address], [place], [place] [address]   Culture, blood (Routine X 2) w Reflex to ID Panel     Status: Abnormal (Preliminary result)   Collection Time: 07/06/20  4:00 AM   Specimen: BLOOD  Result Value Ref Range Status   Specimen Description   Final    BLOOD BLOOD RIGHT FOREARM Performed at [place] [address] [place], [place] [address]    Special Requests   Final    BOTTLES DRAWN AEROBIC AND ANAEROBIC Blood Culture adequate volume Performed at [place] [address] [place], [place] [address]    Culture  Setup Time   Final    IN BOTH AEROBIC AND ANAEROBIC BOTTLES GRAM POSITIVE COCCI CRITICAL RESULT CALLED TO, READ BACK BY AND VERIFIED WITH: PHARMD SCOTT C. [id number] FCP    Culture (A)  Final    STAPHYLOCOCCUS EPIDERMIDIS THE SIGNIFICANCE OF ISOLATING THIS ORGANISM FROM A SINGLE SET OF BLOOD CULTURES WHEN MULTIPLE SETS ARE DRAWN IS UNCERTAIN. PLEASE NOTIFY THE MICROBIOLOGY DEPARTMENT WITHIN ONE WEEK IF SPECIATION AND SENSITIVITIES ARE REQUIRED. Performed at [facility], [place] [address], [place], [place] [address]    Report Status PENDING  Incomplete  Blood Culture ID Panel (Reflexed)     Status: Abnormal   Collection Time: 07/06/20  4:00 AM  Result Value Ref Range Status    Enterococcus faecalis NOT DETECTED NOT DETECTED Final   Enterococcus Faecium NOT DETECTED NOT DETECTED Final   Listeria monocytogenes NOT DETECTED NOT DETECTED Final   Staphylococcus species DETECTED (A) NOT DETECTED Final    Comment: CRITICAL RESULT CALLED TO, READ BACK BY AND VERIFIED WITH: PHARMD SCOTT C. [id number] FCP    Staphylococcus aureus (BCID) NOT DETECTED NOT DETECTED Final   Staphylococcus epidermidis DETECTED (A) NOT DETECTED Final   Staphylococcus lugdunensis NOT DETECTED NOT DETECTED Final   Streptococcus species NOT DETECTED NOT DETECTED Final   Streptococcus agalactiae NOT DETECTED NOT DETECTED Final   Streptococcus pneumoniae NOT DETECTED NOT DETECTED Final   Streptococcus pyogenes NOT DETECTED NOT DETECTED Final   A.calcoaceticus-baumannii NOT DETECTED NOT DETECTED Final   Bacteroides fragilis NOT DETECTED NOT DETECTED Final   Enterobacterales NOT DETECTED NOT DETECTED Final   Enterobacter cloacae complex NOT DETECTED NOT DETECTED Final  Escherichia coli NOT DETECTED NOT DETECTED Final   Klebsiella aerogenes NOT DETECTED NOT DETECTED Final   Klebsiella oxytoca NOT DETECTED NOT DETECTED Final   Klebsiella pneumoniae NOT DETECTED NOT DETECTED Final   Proteus species NOT DETECTED NOT DETECTED Final   Salmonella species NOT DETECTED NOT DETECTED Final   Serratia marcescens NOT DETECTED NOT DETECTED Final   Haemophilus influenzae NOT DETECTED NOT DETECTED Final   Neisseria meningitidis NOT DETECTED NOT DETECTED Final   Pseudomonas aeruginosa NOT DETECTED NOT DETECTED Final   Stenotrophomonas maltophilia NOT DETECTED NOT DETECTED Final   Candida albicans NOT DETECTED NOT DETECTED Final   Candida auris NOT DETECTED NOT DETECTED Final   Candida glabrata NOT DETECTED NOT DETECTED Final   Candida krusei NOT DETECTED NOT DETECTED Final   Candida parapsilosis NOT DETECTED NOT DETECTED Final   Candida tropicalis NOT DETECTED NOT DETECTED Final   Cryptococcus  neoformans/gattii NOT DETECTED NOT DETECTED Final   Methicillin resistance mecA/C NOT DETECTED NOT DETECTED Final    Comment: Performed at [facility], [place] [address] [place], [place] [address]  MRSA PCR Screening     Status: None   Collection Time: 07/06/20  3:33 PM   Specimen: Nasal Mucosa; Nasopharyngeal  Result Value Ref Range Status   MRSA by PCR NEGATIVE NEGATIVE Final    Comment:        The GeneXpert MRSA Assay (FDA approved for NASAL specimens only), is one component of a comprehensive MRSA colonization surveillance program. It is not intended to diagnose MRSA infection nor to guide or monitor treatment for MRSA infections. Performed at [facility], [place] [address], [place], [place] [address]       Radiology Studies: ECHOCARDIOGRAM COMPLETE  Result Date: 07/09/2020    ECHOCARDIOGRAM REPORT   Patient Name:   Brian Silva Date of Exam: 07/09/2020 Medical Rec #:  [id number]        Height:       70.0 in Accession #:    [id number]       Weight:       369.9 lb Date of Birth:  December 30, 1940        BSA:          2.711 m Patient Age:    28 years         BP:           124/51 mmHg Patient Gender: M                HR:           83 bpm. Exam Location:  Inpatient Procedure: 2D Echo, Cardiac Doppler, Color Doppler and Intracardiac            Opacification Agent Indications:    Bacteremia R78.81  History:        Patient has prior history of Echocardiogram examinations, most                 recent 09/11/2019. Risk Factors:Hypertension, Dyslipidemia and                 Sleep Apnea. Chronic Respiratory Failure.  Sonographer:    Tiffany Dance Referring Phys: [id number]  Sonographer Comments: Patient is morbidly obese, Technically difficult study due to poor echo windows, no parasternal window and suboptimal apical window. IMPRESSIONS  1. Images are quite limited. Definity contrast utilized.  2. Study is not adequate to assess for valvular vegetations.  3. Left  ventricular ejection fraction, by estimation,  is 65 to 70%. The left ventricle has normal function. Left ventricular endocardial border not optimally defined to evaluate regional wall motion. There is mild left ventricular hypertrophy. Left ventricular diastolic parameters are indeterminate.  4. Right ventricular systolic function is normal. The right ventricular size is moderately enlarged. Tricuspid regurgitation signal is inadequate for assessing PA pressure.  5. The mitral valve was not well visualized. No evidence of mitral valve regurgitation.  6. The aortic valve is tricuspid. There is mild calcification of the aortic valve. Aortic valve regurgitation is not visualized.  7. The inferior vena cava is normal in size with greater than 50% respiratory variability, suggesting right atrial pressure of 3 mmHg. FINDINGS  Left Ventricle: Left ventricular ejection fraction, by estimation, is 65 to 70%. The left ventricle has normal function. Left ventricular endocardial border not optimally defined to evaluate regional wall motion. Definity contrast agent was given IV to delineate the left ventricular endocardial borders. The left ventricular internal cavity size was normal in size. There is mild left ventricular hypertrophy. Left ventricular diastolic parameters are indeterminate. Right Ventricle: The right ventricular size is moderately enlarged. No increase in right ventricular wall thickness. Right ventricular systolic function is normal. Tricuspid regurgitation signal is inadequate for assessing PA pressure. Left Atrium: Left atrial size was normal in size. Right Atrium: Right atrial size was not well visualized. Pericardium: There is no evidence of pericardial effusion. Presence of pericardial fat pad. Mitral Valve: The mitral valve was not well visualized. No evidence of mitral valve regurgitation. Tricuspid Valve: The tricuspid valve is not well visualized. Tricuspid valve regurgitation is trivial. Aortic  Valve: The aortic valve is tricuspid. There is mild calcification of the aortic valve. Aortic valve regurgitation is not visualized. Pulmonic Valve: The pulmonic valve was not assessed. Pulmonic valve regurgitation is not visualized. Aorta: The aortic root was not well visualized. Venous: The inferior vena cava is normal in size with greater than 50% respiratory variability, suggesting right atrial pressure of 3 mmHg. IAS/Shunts: No atrial level shunt detected by color flow Doppler.   Diastology LV e' medial:    5.98 cm/s LV E/e' medial:  14.7 LV e' lateral:   4.13 cm/s LV E/e' lateral: 21.3  RIGHT VENTRICLE             IVC RV Basal diam:  3.20 cm     IVC diam: 2.10 cm RV Mid diam:    3.10 cm RV S prime:     14.30 cm/s TAPSE (M-mode): 1.6 cm LEFT ATRIUM           Index       RIGHT ATRIUM           Index LA Vol (A4C): 41.1 ml 15.16 ml/m RA Area:     17.10 cm                                   RA Volume:   44.80 ml  16.53 ml/m  AORTIC VALVE LVOT Vmax:   105.00 cm/s LVOT Vmean:  74.300 cm/s LVOT VTI:    0.201 m MITRAL VALVE MV Area (PHT): 3.37 cm    SHUNTS MV Decel Time: 225 msec    Systemic VTI: 0.20 m MV E velocity: 88.10 cm/s MV A velocity: 95.20 cm/s MV E/A ratio:  0.93 Rozann Lesches MD Electronically signed by Rozann Lesches MD Signature Date/Time: 07/09/2020/2:05:24 PM    Final  LOS: 4 days   Stuart Guillen [employer] on [email or web address]  07/10/2020, 9:56 AM

## 2020-07-11 DIAGNOSIS — N184 Chronic kidney disease, stage 4 (severe): Secondary | ICD-10-CM | POA: Diagnosis not present

## 2020-07-11 DIAGNOSIS — N19 Unspecified kidney failure: Secondary | ICD-10-CM | POA: Diagnosis not present

## 2020-07-11 DIAGNOSIS — Z743 Need for continuous supervision: Secondary | ICD-10-CM | POA: Diagnosis not present

## 2020-07-11 DIAGNOSIS — Z9981 Dependence on supplemental oxygen: Secondary | ICD-10-CM | POA: Diagnosis not present

## 2020-07-11 DIAGNOSIS — J9601 Acute respiratory failure with hypoxia: Secondary | ICD-10-CM | POA: Diagnosis not present

## 2020-07-11 DIAGNOSIS — N170 Acute kidney failure with tubular necrosis: Secondary | ICD-10-CM | POA: Diagnosis not present

## 2020-07-11 DIAGNOSIS — R112 Nausea with vomiting, unspecified: Secondary | ICD-10-CM | POA: Diagnosis not present

## 2020-07-11 DIAGNOSIS — J45909 Unspecified asthma, uncomplicated: Secondary | ICD-10-CM | POA: Diagnosis not present

## 2020-07-11 DIAGNOSIS — I959 Hypotension, unspecified: Secondary | ICD-10-CM | POA: Diagnosis not present

## 2020-07-11 DIAGNOSIS — E875 Hyperkalemia: Secondary | ICD-10-CM | POA: Diagnosis not present

## 2020-07-11 DIAGNOSIS — G4733 Obstructive sleep apnea (adult) (pediatric): Secondary | ICD-10-CM | POA: Diagnosis not present

## 2020-07-11 DIAGNOSIS — S22088S Other fracture of T11-T12 vertebra, sequela: Secondary | ICD-10-CM | POA: Diagnosis not present

## 2020-07-11 DIAGNOSIS — R41841 Cognitive communication deficit: Secondary | ICD-10-CM | POA: Diagnosis not present

## 2020-07-11 DIAGNOSIS — E1165 Type 2 diabetes mellitus with hyperglycemia: Secondary | ICD-10-CM | POA: Diagnosis not present

## 2020-07-11 DIAGNOSIS — J9610 Chronic respiratory failure, unspecified whether with hypoxia or hypercapnia: Secondary | ICD-10-CM | POA: Diagnosis not present

## 2020-07-11 DIAGNOSIS — E1129 Type 2 diabetes mellitus with other diabetic kidney complication: Secondary | ICD-10-CM | POA: Diagnosis not present

## 2020-07-11 DIAGNOSIS — R402411 Glasgow coma scale score 13-15, in the field [EMT or ambulance]: Secondary | ICD-10-CM | POA: Diagnosis not present

## 2020-07-11 DIAGNOSIS — M6281 Muscle weakness (generalized): Secondary | ICD-10-CM | POA: Diagnosis not present

## 2020-07-11 DIAGNOSIS — G629 Polyneuropathy, unspecified: Secondary | ICD-10-CM | POA: Diagnosis not present

## 2020-07-11 DIAGNOSIS — I700 Atherosclerosis of aorta: Secondary | ICD-10-CM | POA: Diagnosis not present

## 2020-07-11 DIAGNOSIS — N182 Chronic kidney disease, stage 2 (mild): Secondary | ICD-10-CM | POA: Diagnosis not present

## 2020-07-11 DIAGNOSIS — J189 Pneumonia, unspecified organism: Secondary | ICD-10-CM | POA: Diagnosis not present

## 2020-07-11 DIAGNOSIS — E871 Hypo-osmolality and hyponatremia: Secondary | ICD-10-CM | POA: Diagnosis not present

## 2020-07-11 DIAGNOSIS — E538 Deficiency of other specified B group vitamins: Secondary | ICD-10-CM | POA: Diagnosis not present

## 2020-07-11 DIAGNOSIS — R609 Edema, unspecified: Secondary | ICD-10-CM | POA: Diagnosis not present

## 2020-07-11 DIAGNOSIS — R2681 Unsteadiness on feet: Secondary | ICD-10-CM | POA: Diagnosis not present

## 2020-07-11 DIAGNOSIS — R262 Difficulty in walking, not elsewhere classified: Secondary | ICD-10-CM | POA: Diagnosis not present

## 2020-07-11 DIAGNOSIS — I509 Heart failure, unspecified: Secondary | ICD-10-CM | POA: Diagnosis not present

## 2020-07-11 DIAGNOSIS — M25561 Pain in right knee: Secondary | ICD-10-CM | POA: Diagnosis not present

## 2020-07-11 DIAGNOSIS — I10 Essential (primary) hypertension: Secondary | ICD-10-CM | POA: Diagnosis not present

## 2020-07-11 DIAGNOSIS — R55 Syncope and collapse: Secondary | ICD-10-CM | POA: Diagnosis not present

## 2020-07-11 DIAGNOSIS — E119 Type 2 diabetes mellitus without complications: Secondary | ICD-10-CM | POA: Diagnosis not present

## 2020-07-11 DIAGNOSIS — R279 Unspecified lack of coordination: Secondary | ICD-10-CM | POA: Diagnosis not present

## 2020-07-11 DIAGNOSIS — Z23 Encounter for immunization: Secondary | ICD-10-CM | POA: Diagnosis not present

## 2020-07-11 DIAGNOSIS — D649 Anemia, unspecified: Secondary | ICD-10-CM | POA: Diagnosis not present

## 2020-07-11 DIAGNOSIS — E785 Hyperlipidemia, unspecified: Secondary | ICD-10-CM | POA: Diagnosis not present

## 2020-07-11 DIAGNOSIS — N179 Acute kidney failure, unspecified: Secondary | ICD-10-CM | POA: Diagnosis not present

## 2020-07-11 DIAGNOSIS — E1169 Type 2 diabetes mellitus with other specified complication: Secondary | ICD-10-CM | POA: Diagnosis not present

## 2020-07-11 LAB — CULTURE, BLOOD (ROUTINE X 2)
Special Requests: ADEQUATE
Special Requests: ADEQUATE

## 2020-07-11 LAB — RENAL FUNCTION PANEL
Albumin: 2.9 g/dL — ABNORMAL LOW (ref 3.5–5.0)
Anion gap: 13 (ref 5–15)
BUN: 61 mg/dL — ABNORMAL HIGH (ref 8–23)
CO2: 21 mmol/L — ABNORMAL LOW (ref 22–32)
Calcium: 8.3 mg/dL — ABNORMAL LOW (ref 8.9–10.3)
Chloride: 96 mmol/L — ABNORMAL LOW (ref 98–111)
Creatinine, Ser: 4.44 mg/dL — ABNORMAL HIGH (ref 0.61–1.24)
GFR, Estimated: 13 mL/min — ABNORMAL LOW (ref >60)
Glucose, Bld: 98 mg/dL (ref 70–99)
Phosphorus: 4.9 mg/dL — ABNORMAL HIGH (ref 2.5–4.6)
Potassium: 4.5 mmol/L (ref 3.5–5.1)
Sodium: 130 mmol/L — ABNORMAL LOW (ref 135–145)

## 2020-07-11 LAB — CBC
HCT: 28.6 % — ABNORMAL LOW (ref 39.0–52.0)
Hemoglobin: 9.2 g/dL — ABNORMAL LOW (ref 13.0–17.0)
MCH: 27.1 pg (ref 26.0–34.0)
MCHC: 32.2 g/dL (ref 30.0–36.0)
MCV: 84.1 fL (ref 80.0–100.0)
Platelets: 485 10*3/uL — ABNORMAL HIGH (ref 150–400)
RBC: 3.4 MIL/uL — ABNORMAL LOW (ref 4.22–5.81)
RDW: 13.9 % (ref 11.5–15.5)
WBC: 9.1 10*3/uL (ref 4.0–10.5)
nRBC: 0 % (ref 0.0–0.2)

## 2020-07-11 LAB — RESP PANEL BY RT-PCR (FLU A&B, COVID) ARPGX2
Influenza A by PCR: NEGATIVE
Influenza B by PCR: NEGATIVE
SARS Coronavirus 2 by RT PCR: NEGATIVE

## 2020-07-11 MED ORDER — CYANOCOBALAMIN 500 MCG PO TABS: 500.0000 ug | ORAL_TABLET | Freq: Every day | ORAL | Status: AC

## 2020-07-11 MED ORDER — FOLIC ACID 1 MG PO TABS: 1.0000 mg | ORAL_TABLET | Freq: Every day | ORAL | Status: AC

## 2020-07-11 MED ORDER — AMLODIPINE BESYLATE 5 MG PO TABS: 5.0000 mg | ORAL_TABLET | Freq: Every day | ORAL | Status: DC

## 2020-07-11 MED ORDER — TRAMADOL HCL 50 MG PO TABS: 50.0000 mg | ORAL_TABLET | Freq: Three times a day (TID) | ORAL | 0 refills | Status: DC | PRN

## 2020-07-11 NOTE — Progress Notes (Signed)
Report called to nurse at [facility].

## 2020-07-11 NOTE — Care Management Important Message (Signed)
Important Message  Patient Details IM Letter given to the Patient. Name: DELPHINE DEPIETRO MRN: [id number] Date of Birth: 1940-12-10   Medicare Important Message Given:  Yes     Kerin Salen 07/11/2020, 11:48 AM

## 2020-07-11 NOTE — Progress Notes (Signed)
Attempted to give report x2 to [facility]. Left a message with receptionist the 3rd time as no one is answering.

## 2020-07-11 NOTE — TOC Transition Note (Signed)
Transition of Care [facility]) - CM/SW Discharge Note   Patient Details  Name: Brian Silva MRN: [id number] Date of Birth: 1941/01/17  Transition [facility]) CM/SW Contact:  Trish Mage, LCSW Phone Number: 07/11/2020, 1:32 PM   Clinical Narrative:   Patient who is stable for transfer will return to [facility] today. PTAR arranged.  Nursing, please call report to [phone], room 711A.  No further needs identified.  TOC sign off.    Final next level of care: Skilled Nursing Facility Barriers to Discharge: Barriers Resolved   Patient Goals and CMS Choice Patient states their goals for this hospitalization and ongoing recovery are:: To return to a SNF under short term rehab and then transition to LTC. CMS Medicare.gov Compare Post Acute Care list provided to:: Patient Represenative (must comment) Choice offered to / list presented to : [facility] POA / Guardian,Patient  Discharge Placement                       Discharge Plan and Services     Post Acute Care Choice: [place]                               Social Determinants of Health (SDOH) Interventions     Readmission Risk Interventions No flowsheet data found.

## 2020-07-11 NOTE — Progress Notes (Signed)
Brian Silva Progress Note    Assessment/ Plan:    1.Acuterenal injury. His baseline serum creatinine is within normal range as of September 2021. Patient was using the ACE inhibitor lisinopril as well as Lasix in the setting of volume depletion and diarrhea.Urine sediment is blandno evidence of acute GN or acute interstitial nephritis. CT scan does not reveal any evidence of obstruction. This looks like hemodynamically mediated hypotensive episode leading to acute kidney injury--> suspect ATN. Getting better w/ declining creatinine.  No need for dialysis.  He states he does not have a kidney doctor so we will get him follow up appt with [name] in about 3-4 weeks.  He appears to be autodiuresing. Hold torsemide at dc, can be resumed about 1 week after discharge.  Continue to hold lisinopril til AKI has resolved completely. Will sign off. Please call w/ questions.   2. HTN - stable  3.MSSE bacteremia: likely contaminant- antibiotics d/c today  4.Diabetes mellitus: per primary  5.  Anemia: iron deficient- s/p feraheme 510 m g IV 3/19  6.  Dispo: Ok from renal perspective at any time.    Kelly Splinter, MD 07/11/2020, 12:34 PM       Subjective:    Creat down today, no new c/o. May be dc'ing today.   Objective:   BP 122/82 (BP Location: Right Arm)   Pulse 73   Temp 97.8 F (36.6 C)   Resp 16   Ht '5\' 10"'$  (1.778 m)   Wt (!) 167.9 kg   SpO2 97%   BMI 53.11 kg/m   Intake/Output Summary (Last 24 hours) at 07/11/2020 1232 Last data filed at 07/11/2020 1139 Gross per 24 hour  Intake 954 ml  Output 2375 ml  Net -1421 ml   Weight change:   Physical Exam: Gen: NAD, sitting in bed CVS: RRR Resp: clear Abd: soft Ext: 1+ LE edema   Imaging: No results found.  Labs: BMET Recent Labs  Lab 07/06/20 0039 07/06/20 0128 07/06/20 0750 07/07/20 0326 07/08/20 0347 07/09/20 0334 07/10/20 0252 07/11/20 0303  NA 124*  --  126* 126* 129* 129* 130* 130*   K 5.3*  --  5.0 5.0 4.8 4.5 4.7 4.5  CL 86*  --  87* 91* 96* 94* 97* 96*  CO2 17*  --  21* 20* 20* 21* 20* 21*  GLUCOSE 94  --  93 98 95 108* 107* 98  BUN 95*  --  96* 96* 81* 77* 67* 61*  CREATININE 6.26*  --  6.56* 6.25* 5.50* 5.37* 4.93* 4.44*  CALCIUM 7.6*  --  7.3* 6.9* 7.2* 7.3* 7.9* 8.3*  PHOS  --  9.1*  --  7.7* 7.4* 5.5* 5.1* 4.9*   CBC Recent Labs  Lab 07/06/20 0039 07/07/20 0326 07/08/20 0347 07/09/20 0334 07/10/20 0632 07/11/20 0303  WBC 10.5   < > 7.1 9.2 9.4 9.1  NEUTROABS 8.3*  --   --   --   --   --   HGB 10.1*   < > 10.1* 8.8* 9.0* 9.2*  HCT 32.4*   < > 30.1* 26.8* 27.2* 28.6*  MCV 85.9   < > 80.5 81.2 81.2 84.1  PLT 388   < > 453* 466* 496* 485*   < > = values in this interval not displayed.    Medications:    . amLODipine  5 mg Oral Daily  . aspirin EC  81 mg Oral Daily  . atorvastatin  20 mg Oral QHS  .  folic acid  1 mg Oral Daily  . guaiFENesin  600 mg Oral BID  . heparin  5,000 Units Subcutaneous Q8H  . melatonin  3 mg Oral QHS  . vitamin B-12  500 mcg Oral Daily

## 2020-07-11 NOTE — Clinical Social Work Note (Signed)
Brian Silva, Brian Silva (CSN: [id number]) (80 y.o. M) (Adm: 07/06/20) [id number]  Microbiology Results  Procedure Component Value Units Date/Time  Resp Panel by RT-PCR (Flu A&B, Covid) Nasopharyngeal Swab [id number] Collected: 07/11/20 1049  Order Status: Completed Specimen: Nasopharyngeal(NP) swabs in vial transport medium from Nasopharyngeal Swab Updated: 07/11/20 1143   SARS Coronavirus 2 by RT PCR NEGATIVE   Comment: (NOTE)  SARS-CoV-2 target nucleic acids are NOT DETECTED.   The SARS-CoV-2 RNA is generally detectable in upper respiratory  specimens during the acute phase of infection. The lowest  concentration of SARS-CoV-2 viral copies this assay can detect is  138 copies/mL. A negative result does not preclude SARS-Cov-2  infection and should not be used as the sole basis for treatment or  other patient management decisions. A negative result may occur with  improper specimen collection/handling, submission of specimen other  than nasopharyngeal swab, presence of viral mutation(s) within the  areas targeted by this assay, and inadequate number of viral  copies(<138 copies/mL). A negative result must be combined with  clinical observations, patient history, and epidemiological  information. The expected result is Negative.   Fact Sheet for Patients:  EntrepreneurPulse.com.au   Fact Sheet for Healthcare Providers:  IncredibleEmployment.be   This test is not yet approved or cleared by the [place] FDA and  has been authorized for detection and/or diagnosis of SARS-CoV-2 by  FDA under an Emergency Use Authorization (EUA). This EUA will remain  in effect (meaning this test can be used) for the duration of the  COVID-19 declaration under Section 564(b)(1) of the Act, 21  U.S.C.section 360bbb-3(b)(1), unless the authorization is terminated  or revoked sooner.        Influenza A by PCR NEGATIVE   Influenza B by PCR  NEGATIVE   Comment: (NOTE)  The Xpert Xpress SARS-CoV-2/FLU/RSV plus assay is intended as an aid  in the diagnosis of influenza from Nasopharyngeal swab specimens and  should not be used as a sole basis for treatment. Nasal washings and  aspirates are unacceptable for Xpert Xpress SARS-CoV-2/FLU/RSV  testing.   Fact Sheet for Patients:  EntrepreneurPulse.com.au   Fact Sheet for Healthcare Providers:  IncredibleEmployment.be   This test is not yet approved or cleared by the [place] FDA and  has been authorized for detection and/or diagnosis of SARS-CoV-2 by  FDA under an Emergency Use Authorization (EUA). This EUA will remain  in effect (meaning this test can be used) for the duration of the  COVID-19 declaration under Section 564(b)(1) of the Act, 21 U.S.C.  section 360bbb-3(b)(1), unless the authorization is terminated or  revoked.   Performed at [facility], [place] [address]  [place], [place] [address]

## 2020-07-11 NOTE — Discharge Instructions (Signed)
Acute Kidney Injury, Adult  Acute kidney injury is a sudden worsening of kidney function. The kidneys are organs that have several jobs. They filter the blood to remove waste products and extra fluid. They also maintain a healthy balance of minerals and hormones in the body, which helps control blood pressure and keep bones strong. With this condition, your kidneys do not do their jobs as well as they should. This condition ranges from mild to severe. Over time, it may develop into long-lasting (chronic) kidney disease. Early detection and treatment may prevent acute kidney injury from developing into a chronic condition. What are the causes? Common causes of this condition include:  A problem with blood flow to the kidneys. This may be caused by: ? Low blood pressure (hypotension) or shock. ? Blood loss. ? Heart and blood vessel (cardiovascular) disease. ? Severe burns. ? Liver disease.  Direct damage to the kidneys. This may be caused by: ? Certain medicines. ? A kidney infection. ? Poisoning. ? Being around or in contact with toxic substances. ? A surgical wound. ? A hard, direct hit to the kidney area.  A sudden blockage of urine flow. This may be caused by: ? Cancer. ? Kidney stones. ? An enlarged prostate in males. What increases the risk? You are more likely to develop this condition if you:  Are older than age 65.  Are male.  Are hospitalized, especially if you are in critical condition.  Have certain conditions, such as: ? Chronic kidney disease. ? Diabetes. ? Coronary artery disease and heart failure. ? Pulmonary disease. ? Chronic liver disease. What are the signs or symptoms? Symptoms of this condition may not be obvious until the condition becomes severe. Symptoms of this condition can include:  Tiredness (lethargy) or difficulty staying awake.  Nausea or vomiting.  Swelling (edema) of the face, legs, ankles, or feet.  Problems with urination, such  as: ? Pain in the abdomen, or pain along the side of your stomach (flank). ? Producing little or no urine. ? Passing urine with a weak flow.  Muscle twitches and cramps, especially in the legs.  Confusion or trouble concentrating.  Loss of appetite.  Fever. How is this diagnosed? Your health care provider can diagnose this condition based on your symptoms, medical history, and a physical exam.  You may also have other tests, such as:  Blood tests.  Urine tests.  Imaging tests.  A test in which a sample of tissue is removed from the kidneys to be examined under a microscope (kidney biopsy). How is this treated? Treatment for this condition depends on the cause and how severe the condition is. In mild cases, treatment may not be needed. The kidneys may heal on their own. In more severe cases, treatment will involve:  Treating the cause of the kidney injury. This may involve changing any medicines you are taking or adjusting your dosage.  Fluids. You may need specialized IV fluids to balance your body's needs.  Having a catheter placed to drain urine and prevent blockages.  Preventing problems from occurring. This may mean avoiding certain medicines or procedures that can cause further injury to the kidneys. In some cases, treatment may also require:  A procedure to remove toxic wastes from the body (dialysis or continuous renal replacement therapy, CRRT).  Surgery. This may be done to repair a torn kidney or to remove the blockage from the urinary system. Follow these instructions at home: Medicines  Take over-the-counter and prescription medicines only as   told by your health care provider.  Do not take any new medicines without your health care provider's approval. Many medicines can worsen your kidney damage.  Do not take any vitamin and mineral supplements without your health care provider's approval. Many nutritional supplements can worsen your kidney  damage. Lifestyle  If your health care provider prescribed changes to your diet, follow them. You may need to decrease the amount of protein you eat.  Achieve and maintain a healthy weight. If you need help with this, ask your health care provider.  Start or continue an exercise plan. Try to exercise at least 30 minutes a day, 5 days a week.  Do not use any products that contain nicotine or tobacco, such as cigarettes, e-cigarettes, and chewing tobacco. If you need help quitting, ask your health care provider.   General instructions  Keep track of your blood pressure. Report changes in your blood pressure as told by your health care provider.  Stay up to date with your vaccines. Ask your health care provider which vaccines you need.  Keep all follow-up visits as told by your health care provider. This is important.   Where to find more information  American Association of Kidney Patients: www.aakp.org  National Kidney Foundation: www.kidney.org  American Kidney Fund: www.akfinc.org  Life Options Rehabilitation Program: ? www.lifeoptions.org ? www.kidneyschool.org Contact a health care provider if:  Your symptoms get worse.  You develop new symptoms. Get help right away if:  You develop symptoms of worsening kidney disease, which include: ? Headaches. ? Abnormally dark or light skin. ? Easy bruising. ? Frequent hiccups. ? Chest pain. ? Shortness of breath. ? End of menstruation in women. ? Seizures. ? Confusion or altered mental status. ? Abdominal or back pain. ? Itchiness.  You have a fever.  Your body is producing less urine.  You have pain or bleeding when you urinate. Summary  Acute kidney injury is a sudden worsening of kidney function.  Acute kidney injury can be caused by problems with blood flow to the kidneys, direct damage to the kidneys, and sudden blockage of urine flow.  Symptoms of this condition may not be obvious until it becomes severe.  Symptoms may include edema, lethargy, confusion, nausea or vomiting, and problems passing urine.  This condition can be diagnosed with blood tests, urine tests, and imaging tests. Sometimes a kidney biopsy is done to diagnose this condition.  Treatment for this condition often involves treating the underlying cause. It is treated with fluids, medicines, diet changes, dialysis, or surgery. This information is not intended to replace advice given to you by your health care provider. Make sure you discuss any questions you have with your health care provider. Document Revised: 02/17/2019 Document Reviewed: 02/17/2019 Elsevier Patient Education  2021 [employer]  

## 2020-07-11 NOTE — Discharge Summary (Addendum)
[facility]  Physician Discharge Summary   Patient ID: Brian Silva MRN: [id number] DOB/AGE: September 27, 1940 80 y.o.  Admit date: 07/06/2020 Discharge date: 07/11/2020  PCP: Benito Mccreedy, MD  DISCHARGE DIAGNOSES:  Acute kidney injury likely secondary to ATN Chronic kidney disease stage II Morbid obesity Essential hypertension Chronic diastolic CHF Folic acid deficiency Diabetes mellitus type 2  RECOMMENDATIONS FOR OUTPATIENT FOLLOW UP: 1. Please note that patient is furosemide and lisinopril have been placed on hold 2. Please check basic metabolic panel before the end of this week to make sure that the kidney function is improving 3. May resume diuretics from next week if renal function gets back to baseline. 4. Continue to hold Metformin until renal function is back to baseline. 5. Please verify that patient has been on midodrine along with his antihypertensives.    Home Health: Going to SNF Equipment/Devices: None  CODE STATUS: Patient rescinded DNR. He is Full Code  DISCHARGE CONDITION: fair  Diet recommendation: Modified carbohydrate/renal  INITIAL HISTORY: 81 y.o.malewith medical history significant fortype 2 diabetes mellitus, hypertension, OSA, history of TIA, and mild chronic renal insufficiency, presented to the emergency department from his SNF for evaluation of abnormal blood work.   About 2 weeks prior to admission patient was experiencing nausea vomiting and diarrhea.  That lasted about 3 days.  Blood work at the skilled nursing facility revealed worsening in his renal function and he was sent over to the ED.  Subsequently hospitalized.    Consultants:  Nephrology Infectious disease  Procedures:  Transthoracic echocardiogram 3/19 1. Images are quite limited. Definity contrast utilized.  2. Study is not adequate to assess for valvular vegetations.  3. Left ventricular ejection fraction, by estimation, is 65 to 70%. The  left  ventricle has normal function. Left ventricular endocardial border  not optimally defined to evaluate regional wall motion. There is mild left  ventricular hypertrophy. Left  ventricular diastolic parameters are indeterminate.  4. Right ventricular systolic function is normal. The right ventricular  size is moderately enlarged. Tricuspid regurgitation signal is inadequate  for assessing PA pressure.  5. The mitral valve was not well visualized. No evidence of mitral valve  regurgitation.  6. The aortic valve is tricuspid. There is mild calcification of the  aortic valve. Aortic valve regurgitation is not visualized.  7. The inferior vena cava is normal in size with greater than 50%  respiratory variability, suggesting right atrial pressure of 3 mmHg.     HOSPITAL COURSE:   Acute kidney injury superimposed on chronic kidney disease stage II with mild hyperkalemia/metabolic acidosis Patient had significantly elevated BUN and creatinine of 35 and 6.26 at admission.  Potassium was 5.3.  Baseline renal function is close to 1.5.  This could have been triggered by his GI symptoms 2 weeks prior to admission and then worsened in the setting of Lasix as well as ACE inhibitor.  UA noted to be bland.  Urine electrolyte studies noted.  Noncontrast CT scan did not show any hydronephrosis.  ACE inhibitor on hold along with the Lasix which is also on hold.  ATN was a possibility. Patient was seen by nephrology.  She was given IV fluids.  He continues to have good urine output.  Creatinine has improved.  No further interventions per nephrology.  Renal function will need to be monitored at a skilled nursing facility.  Continue to hold his furosemide and ACE inhibitor.  Bacteremia, likely contaminant Patient growing staph hominis and staph epidermidis.  Infectious disease was  consulted.  Patient was noted to be febrile.  He had normal WBC.  Does not have any infected wounds.  Does not have any Port-A-Cath  or other foreign objects.  This is thought to be a contaminant.  Antibiotics were discontinued.  Echocardiogram was a limited study due to body habitus.  Normal systolic function noted.  Valves could not be clearly evaluated for vegetations.  Will not pursue this further since we think that his bacteremia was due to contamination.  He has been afebrile.  Questionable pneumonia Apparently has had a cough for 2 weeks or so.  Patient was initially started on ceftriaxone and azithromycin.  Changed over to cefazolin.  Currently off of antibiotics.  Patient did have some cough which was controlled with Mucinex.  Saturating normal on room air. Influenza and COVID-19 PCR negative.  Does not have any oxygen requirements currently.  Hyponatremia Could be due to diuretics.  Sodium level has improved and stable.    Recheck periodically in the outpatient setting.  Essential hypertension Blood pressure noted to be in the hypertensive range over the last 24 hours.  His home medications were placed on hold.  He was noted to be on amlodipine, furosemide, lisinopril.  Continue to hold furosemide and lisinopril.    Amlodipine resumed at a lower dose.  Home medication list also shows that he is on midodrine reason for which is not entirely clear.  This can be addressed by his outpatient providers.  Chronic diastolic CHF Systolic function was preserved in 2021.  Holding furosemide and lisinopril.    Furosemide can be resumed once renal function has returned to baseline.  He is currently auto diuresing.  Normocytic anemia/folic acid deficiency Likely anemia of chronic disease.  Anemia panel reviewed.  No clear evidence for iron deficiency.  B12 level was borderline low at 241.  Folic acid low at 5.2.  Both of these initiated. Hemoglobin is stable for the most part.  Slight drop was due to dilution.  No overt bleeding noted.  History of TIA Continue aspirin and statin.  MASD Sacral area Wound care has  seen.  Obstructive sleep apnea Patient mentions that he has not been using CPAP at all prior to admission.  Has been noncompliant here in the hospital.  CPAP was discontinued.  Diabetes mellitus type 2 Noted to be on Metformin at SNF which is currently on hold.  HbA1c was 5.7 in September.    May resume Metformin when renal function is back to baseline.  Recent back injury/T12 compression fracture Apparently had a fall with back injury.  That is the reason why he is in SNF for rehab.  Did not need to undergo any surgery per patient.    Morbid obesity Estimated body mass index is 53.11 kg/m as calculated from the following:   Height as of this encounter: '5\' 10"'$  (1.778 m).   Weight as of this encounter: 167.9 kg.   Patient is stable.  Renal function is improved.  Cleared by nephrology for discharge.  Okay for discharge to SNF.   PERTINENT LABS:  The results of significant diagnostics from this hospitalization (including imaging, microbiology, ancillary and laboratory) are listed below for reference.    Microbiology: Recent Results (from the past 240 hour(s))  Urine Culture     Status: Abnormal   Collection Time: 07/06/20 12:39 AM   Specimen: Urine, Random  Result Value Ref Range Status   Specimen Description   Final    URINE, RANDOM Performed at [facility]  [facility], [place] [address], [place], [place] [address]    Special Requests   Final    NONE Performed at [facility], [place] [address], [place], [place] [address]    Culture (A)  Final    <10,000 COLONIES/mL INSIGNIFICANT GROWTH Performed at [facility] [address] [place], [place] [address]    Report Status 07/07/2020 FINAL  Final  Culture, blood (Routine X 2) w Reflex to ID Panel     Status: Abnormal (Preliminary result)   Collection Time: 07/06/20  3:55 AM   Specimen: BLOOD  Result Value Ref Range Status   Specimen Description   Final    BLOOD BLOOD LEFT  FOREARM Performed at [facility] [address], [place], [place] [address]    Special Requests   Final    BOTTLES DRAWN AEROBIC AND ANAEROBIC Blood Culture adequate volume Performed at [facility] [address] [place], [place] [address]    Culture  Setup Time   Final    AEROBIC BOTTLE ONLY GRAM POSITIVE COCCI CRITICAL VALUE NOTED.  VALUE IS CONSISTENT WITH PREVIOUSLY REPORTED AND CALLED VALUE. Performed at [facility], [place] [address], [place],  [address]    Culture STAPHYLOCOCCUS HOMINIS (A)  Final   Report Status PENDING  Incomplete  Resp Panel by RT-PCR (Flu A&B, Covid) Nasopharyngeal Swab     Status: None   Collection Time: 07/06/20  3:57 AM   Specimen: Nasopharyngeal Swab; Nasopharyngeal(NP) swabs in vial transport medium  Result Value Ref Range Status   SARS Coronavirus 2 by RT PCR NEGATIVE NEGATIVE Final    Comment: (NOTE) SARS-CoV-2 target nucleic acids are NOT DETECTED.  The SARS-CoV-2 RNA is generally detectable in upper respiratory specimens during the acute phase of infection. The lowest concentration of SARS-CoV-2 viral copies this assay can detect is 138 copies/mL. A negative result does not preclude SARS-Cov-2 infection and should not be used as the sole basis for treatment or other patient management decisions. A negative result may occur with  improper specimen collection/handling, submission of specimen other than nasopharyngeal swab, presence of viral mutation(s) within the areas targeted by this assay, and inadequate number of viral copies(<138 copies/mL). A negative result must be combined with clinical observations, patient history, and epidemiological information. The expected result is Negative.  Fact Sheet for Patients:  EntrepreneurPulse.com.au  Fact Sheet for Healthcare Providers:  IncredibleEmployment.be  This test is no t yet approved or cleared by the  [place] FDA and  has been authorized for detection and/or diagnosis of SARS-CoV-2 by FDA under an Emergency Use Authorization (EUA). This EUA will remain  in effect (meaning this test can be used) for the duration of the COVID-19 declaration under Section 564(b)(1) of the Act, 21 U.S.C.section 360bbb-3(b)(1), unless the authorization is terminated  or revoked sooner.       Influenza A by PCR NEGATIVE NEGATIVE Final   Influenza B by PCR NEGATIVE NEGATIVE Final    Comment: (NOTE) The Xpert Xpress SARS-CoV-2/FLU/RSV plus assay is intended as an aid in the diagnosis of influenza from Nasopharyngeal swab specimens and should not be used as a sole basis for treatment. Nasal washings and aspirates are unacceptable for Xpert Xpress SARS-CoV-2/FLU/RSV testing.  Fact Sheet for Patients: EntrepreneurPulse.com.au  Fact Sheet for Healthcare Providers: IncredibleEmployment.be  This test is not yet approved or cleared by the [place] FDA and has been authorized for detection and/or diagnosis of SARS-CoV-2 by FDA under an  Emergency Use Authorization (EUA). This EUA will remain in effect (meaning this test can be used) for the duration of the COVID-19 declaration under Section 564(b)(1) of the Act, 21 U.S.C. section 360bbb-3(b)(1), unless the authorization is terminated or revoked.  Performed at [facility], [place] [address] [place], [place] [address]   Culture, blood (Routine X 2) w Reflex to ID Panel     Status: Abnormal   Collection Time: 07/06/20  4:00 AM   Specimen: BLOOD  Result Value Ref Range Status   Specimen Description   Final    BLOOD BLOOD RIGHT FOREARM Performed at [facility] [address] [place], [place] [address]    Special Requests   Final    BOTTLES DRAWN AEROBIC AND ANAEROBIC Blood Culture adequate volume Performed at [facility] [address],  [place], [place] [address]    Culture  Setup Time   Final    IN BOTH AEROBIC AND ANAEROBIC BOTTLES GRAM POSITIVE COCCI CRITICAL RESULT CALLED TO, READ BACK BY AND VERIFIED WITH: PHARMD SCOTT C. [id number] FCP    Culture (A)  Final    STAPHYLOCOCCUS HOMINIS STAPHYLOCOCCUS EPIDERMIDIS THE SIGNIFICANCE OF ISOLATING THIS ORGANISM FROM A SINGLE SET OF BLOOD CULTURES WHEN MULTIPLE SETS ARE DRAWN IS UNCERTAIN. PLEASE NOTIFY THE MICROBIOLOGY DEPARTMENT WITHIN ONE WEEK IF SPECIATION AND SENSITIVITIES ARE REQUIRED. Performed at [facility], [place] [address] [place], [place] [address]    Report Status 07/11/2020 FINAL  Final   Organism ID, Bacteria STAPHYLOCOCCUS HOMINIS  Final      Susceptibility   Staphylococcus hominis - MIC*    CIPROFLOXACIN 4 RESISTANT Resistant     ERYTHROMYCIN >=8 RESISTANT Resistant     GENTAMICIN <=0.5 SENSITIVE Sensitive     OXACILLIN <=0.25 SENSITIVE Sensitive     TETRACYCLINE <=1 SENSITIVE Sensitive     VANCOMYCIN <=0.5 SENSITIVE Sensitive     TRIMETH/SULFA <=10 SENSITIVE Sensitive     CLINDAMYCIN RESISTANT Resistant     RIFAMPIN <=0.5 SENSITIVE Sensitive     Inducible Clindamycin POSITIVE Resistant     * STAPHYLOCOCCUS HOMINIS  Blood Culture ID Panel (Reflexed)     Status: Abnormal   Collection Time: 07/06/20  4:00 AM  Result Value Ref Range Status   Enterococcus faecalis NOT DETECTED NOT DETECTED Final   Enterococcus Faecium NOT DETECTED NOT DETECTED Final   Listeria monocytogenes NOT DETECTED NOT DETECTED Final   Staphylococcus species DETECTED (A) NOT DETECTED Final    Comment: CRITICAL RESULT CALLED TO, READ BACK BY AND VERIFIED WITH: PHARMD SCOTT C. [id number] FCP    Staphylococcus aureus (BCID) NOT DETECTED NOT DETECTED Final   Staphylococcus epidermidis DETECTED (A) NOT DETECTED Final   Staphylococcus lugdunensis NOT DETECTED NOT DETECTED Final   Streptococcus species NOT DETECTED NOT DETECTED Final   Streptococcus agalactiae NOT DETECTED NOT  DETECTED Final   Streptococcus pneumoniae NOT DETECTED NOT DETECTED Final   Streptococcus pyogenes NOT DETECTED NOT DETECTED Final   A.calcoaceticus-baumannii NOT DETECTED NOT DETECTED Final   Bacteroides fragilis NOT DETECTED NOT DETECTED Final   Enterobacterales NOT DETECTED NOT DETECTED Final   Enterobacter cloacae complex NOT DETECTED NOT DETECTED Final   Escherichia coli NOT DETECTED NOT DETECTED Final   Klebsiella aerogenes NOT DETECTED NOT DETECTED Final   Klebsiella oxytoca NOT DETECTED NOT DETECTED Final   Klebsiella pneumoniae NOT DETECTED NOT DETECTED Final   Proteus species NOT DETECTED NOT DETECTED Final   Salmonella species NOT DETECTED NOT  DETECTED Final   Serratia marcescens NOT DETECTED NOT DETECTED Final   Haemophilus influenzae NOT DETECTED NOT DETECTED Final   Neisseria meningitidis NOT DETECTED NOT DETECTED Final   Pseudomonas aeruginosa NOT DETECTED NOT DETECTED Final   Stenotrophomonas maltophilia NOT DETECTED NOT DETECTED Final   Candida albicans NOT DETECTED NOT DETECTED Final   Candida auris NOT DETECTED NOT DETECTED Final   Candida glabrata NOT DETECTED NOT DETECTED Final   Candida krusei NOT DETECTED NOT DETECTED Final   Candida parapsilosis NOT DETECTED NOT DETECTED Final   Candida tropicalis NOT DETECTED NOT DETECTED Final   Cryptococcus neoformans/gattii NOT DETECTED NOT DETECTED Final   Methicillin resistance mecA/C NOT DETECTED NOT DETECTED Final    Comment: Performed at [facility], [place] [address] [place], [place] [address]  MRSA PCR Screening     Status: None   Collection Time: 07/06/20  3:33 PM   Specimen: Nasal Mucosa; Nasopharyngeal  Result Value Ref Range Status   MRSA by PCR NEGATIVE NEGATIVE Final    Comment:        The GeneXpert MRSA Assay (FDA approved for NASAL specimens only), is one component of a comprehensive MRSA colonization surveillance program. It is not intended to diagnose MRSA infection nor to guide or monitor  treatment for MRSA infections. Performed at [facility], [place] [address], [place], [place] [address]      Labs:  COVID-19 Labs   Lab Results  Component Value Date   [place] 07/06/2020   [place] NEGATIVE 12/23/2019   [place] NEGATIVE 12/22/2019   [place] NEGATIVE 09/14/2019      Basic Metabolic Panel: Recent Labs  Lab 07/06/20 0128 07/06/20 0750 07/07/20 0326 07/08/20 0347 07/09/20 0334 07/10/20 0252 07/11/20 0303  NA  --    < > 126* 129* 129* 130* 130*  K  --    < > 5.0 4.8 4.5 4.7 4.5  CL  --    < > 91* 96* 94* 97* 96*  CO2  --    < > 20* 20* 21* 20* 21*  GLUCOSE  --    < > 98 95 108* 107* 98  BUN  --    < > 96* 81* 77* 67* 61*  CREATININE  --    < > 6.25* 5.50* 5.37* 4.93* 4.44*  CALCIUM  --    < > 6.9* 7.2* 7.3* 7.9* 8.3*  MG 1.1*  --   --   --   --   --   --   PHOS 9.1*  --  7.7* 7.4* 5.5* 5.1* 4.9*   < > = values in this interval not displayed.   Liver Function Tests: Recent Labs  Lab 07/06/20 0039 07/07/20 0326 07/08/20 0347 07/09/20 0334 07/10/20 0252 07/11/20 0303  AST 18  --   --   --   --   --   ALT 13  --   --   --   --   --   ALKPHOS 74  --   --   --   --   --   BILITOT 0.7  --   --   --   --   --   PROT 7.0  --   --   --   --   --   ALBUMIN 3.3* 3.0* 2.8* 2.7* 2.6* 2.9*   CBC: Recent Labs  Lab 07/06/20 0039 07/07/20 0326 07/08/20 0347 07/09/20 0334 07/10/20 0632 07/11/20 0303  WBC 10.5 7.5 7.1 9.2 9.4  9.1  NEUTROABS 8.3*  --   --   --   --   --   HGB 10.1* 9.4* 10.1* 8.8* 9.0* 9.2*  HCT 32.4* 28.7* 30.1* 26.8* 27.2* 28.6*  MCV 85.9 81.8 80.5 81.2 81.2 84.1  PLT 388 444* 453* 466* 496* 485*     IMAGING STUDIES DG Chest Port 1 View  Result Date: 07/06/2020 CLINICAL DATA:  Weakness, renal insufficiency EXAM: PORTABLE CHEST 1 VIEW COMPARISON:  12/22/2019 FINDINGS: Single frontal view of the chest demonstrates stable enlargement of the cardiac silhouette. There is patchy consolidation  at the lung bases. On the right, linear consolidation favors subsegmental atelectasis or scarring. On the left, retrocardiac consolidation could reflect airspace disease or atelectasis. No effusion or pneumothorax. No acute bony abnormalities. IMPRESSION: 1. Patchy bibasilar consolidation, which may reflect atelectasis or airspace disease. Electronically Signed   By: Randa Ngo M.D.   On: 07/06/2020 01:18   ECHOCARDIOGRAM COMPLETE  Result Date: 07/09/2020    ECHOCARDIOGRAM REPORT   Patient Name:   AMOUR DYLLA Date of Exam: 07/09/2020 Medical Rec #:  [id number]        Height:       70.0 in Accession #:    [id number]       Weight:       369.9 lb Date of Birth:  October 23, 1940        BSA:          2.711 m Patient Age:    58 years         BP:           124/51 mmHg Patient Gender: M                HR:           83 bpm. Exam Location:  Inpatient Procedure: 2D Echo, Cardiac Doppler, Color Doppler and Intracardiac            Opacification Agent Indications:    Bacteremia R78.81  History:        Patient has prior history of Echocardiogram examinations, most                 recent 09/11/2019. Risk Factors:Hypertension, Dyslipidemia and                 Sleep Apnea. Chronic Respiratory Failure.  Sonographer:    Tiffany Dance Referring Phys: [id number]  Sonographer Comments: Patient is morbidly obese, Technically difficult study due to poor echo windows, no parasternal window and suboptimal apical window. IMPRESSIONS  1. Images are quite limited. Definity contrast utilized.  2. Study is not adequate to assess for valvular vegetations.  3. Left ventricular ejection fraction, by estimation, is 65 to 70%. The left ventricle has normal function. Left ventricular endocardial border not optimally defined to evaluate regional wall motion. There is mild left ventricular hypertrophy. Left ventricular diastolic parameters are indeterminate.  4. Right ventricular systolic function is normal. The right ventricular size is  moderately enlarged. Tricuspid regurgitation signal is inadequate for assessing PA pressure.  5. The mitral valve was not well visualized. No evidence of mitral valve regurgitation.  6. The aortic valve is tricuspid. There is mild calcification of the aortic valve. Aortic valve regurgitation is not visualized.  7. The inferior vena cava is normal in size with greater than 50% respiratory variability, suggesting right atrial pressure of 3 mmHg. FINDINGS  Left Ventricle: Left ventricular ejection fraction, by estimation, is 65 to 70%. The left ventricle has  normal function. Left ventricular endocardial border not optimally defined to evaluate regional wall motion. Definity contrast agent was given IV to delineate the left ventricular endocardial borders. The left ventricular internal cavity size was normal in size. There is mild left ventricular hypertrophy. Left ventricular diastolic parameters are indeterminate. Right Ventricle: The right ventricular size is moderately enlarged. No increase in right ventricular wall thickness. Right ventricular systolic function is normal. Tricuspid regurgitation signal is inadequate for assessing PA pressure. Left Atrium: Left atrial size was normal in size. Right Atrium: Right atrial size was not well visualized. Pericardium: There is no evidence of pericardial effusion. Presence of pericardial fat pad. Mitral Valve: The mitral valve was not well visualized. No evidence of mitral valve regurgitation. Tricuspid Valve: The tricuspid valve is not well visualized. Tricuspid valve regurgitation is trivial. Aortic Valve: The aortic valve is tricuspid. There is mild calcification of the aortic valve. Aortic valve regurgitation is not visualized. Pulmonic Valve: The pulmonic valve was not assessed. Pulmonic valve regurgitation is not visualized. Aorta: The aortic root was not well visualized. Venous: The inferior vena cava is normal in size with greater than 50% respiratory variability,  suggesting right atrial pressure of 3 mmHg. IAS/Shunts: No atrial level shunt detected by color flow Doppler.   Diastology LV e' medial:    5.98 cm/s LV E/e' medial:  14.7 LV e' lateral:   4.13 cm/s LV E/e' lateral: 21.3  RIGHT VENTRICLE             IVC RV Basal diam:  3.20 cm     IVC diam: 2.10 cm RV Mid diam:    3.10 cm RV S prime:     14.30 cm/s TAPSE (M-mode): 1.6 cm LEFT ATRIUM           Index       RIGHT ATRIUM           Index LA Vol (A4C): 41.1 ml 15.16 ml/m RA Area:     17.10 cm                                   RA Volume:   44.80 ml  16.53 ml/m  AORTIC VALVE LVOT Vmax:   105.00 cm/s LVOT Vmean:  74.300 cm/s LVOT VTI:    0.201 m MITRAL VALVE MV Area (PHT): 3.37 cm    SHUNTS MV Decel Time: 225 msec    Systemic VTI: 0.20 m MV E velocity: 88.10 cm/s MV A velocity: 95.20 cm/s MV E/A ratio:  0.93 Rozann Lesches MD Electronically signed by Rozann Lesches MD Signature Date/Time: 07/09/2020/2:05:24 PM    Final    CT RENAL STONE STUDY  Result Date: 07/06/2020 CLINICAL DATA:  Acute renal failure. Unable to void. Concern for stone. EXAM: CT ABDOMEN AND PELVIS WITHOUT CONTRAST TECHNIQUE: Multidetector CT imaging of the abdomen and pelvis was performed following the standard protocol without IV contrast. COMPARISON:  CT abdomen pelvis 12/22/2019 FINDINGS: Lower chest: Mucous impaction within bilateral lower lobes. Bilateral lower lobe subsegmental atelectasis. Bronchiectasis at the left lower lobe. Coronary artery calcifications. Hepatobiliary: No focal liver abnormality. No gallstones, gallbladder wall thickening, or pericholecystic fluid. No biliary dilatation. Pancreas: No focal lesion. Normal pancreatic contour. No surrounding inflammatory changes. No main pancreatic ductal dilatation. Spleen: Punctate calcifications likely represent sequelae of prior granulomatous disease. Otherwise normal in size without focal abnormality. Adrenals/Urinary Tract: No adrenal nodule bilaterally. Persistent bilateral  nonspecific perinephric stranding. Persistent nonspecific  stranding along the distal ureters. No nephrolithiasis, no hydronephrosis, and no contour-deforming renal mass. No ureterolithiasis or hydroureter. The urinary bladder is unremarkable. Stomach/Bowel: Stomach is within normal limits. No evidence of bowel wall thickening or dilatation. Sigmoid diverticulosis. Appendix appears normal. Vascular/Lymphatic: No abdominal aorta or iliac aneurysm. Severe atherosclerotic plaque of the aorta and its branches. No abdominal, pelvic, or inguinal lymphadenopathy. Reproductive: Prostate is unremarkable. Other: No intraperitoneal free fluid. No intraperitoneal free gas. No organized fluid collection. Musculoskeletal: Small fat containing ventral wall hernia with an abdominal defect of 2.5 cm (2:67, 5:155). No suspicious lytic or blastic osseous lesions. No acute displaced fracture. Similar-appearing compression fracture of the T12 vertebral body. Multilevel degenerative changes of the spine. Similar-appearing grade 1 anterolisthesis of L5 on S1. IMPRESSION: 1. No nephroureterolithiasis or hydroureteronephrosis bilaterally. 2. Persistent bilateral nonspecific perinephric and distal ureteral stranding. Correlate with urinalysis for infection. 3. Other imaging findings of potential clinical significance: Bilateral lower lobe mild bronchiectasis and mucous - superimposed post post infection/inflammation not excluded. Sigmoid diverticulosis with no acute diverticulitis. Small fat containing ventral hernia with no findings to suggest ischemia or associated bowel obstruction. Similar-appearing T12 compression fracture. Aortic Atherosclerosis (ICD10-I70.0). Electronically Signed   By: Iven Finn M.D.   On: 07/06/2020 03:41    DISCHARGE EXAMINATION: Vitals:   07/10/20 0500 07/10/20 1429 07/10/20 2029 07/11/20 0557  BP: (!) 163/78 (!) 126/59 (!) 149/60 122/82  Pulse: 82 72 84 73  Resp: '20 18 16 16  '$ Temp: 98.4 F (36.9 C)  97.8 F (36.6 C) 98.4 F (36.9 C) 97.8 F (36.6 C)  TempSrc:   Oral   SpO2: 97% 99% 99% 97%  Weight: (!) 167.9 kg     Height:       General appearance: Awake alert.  In no distress Resp: Clear to auscultation bilaterally.  Normal effort Cardio: S1-S2 is normal regular.  No S3-S4.  No rubs murmurs or bruit GI: Abdomen is soft.  Nontender nondistended.  Bowel sounds are present normal.  No masses organomegaly    DISPOSITION: SNF  Discharge Instructions    Call MD for:  difficulty breathing, headache or visual disturbances   Complete by: As directed    Call MD for:  extreme fatigue   Complete by: As directed    Call MD for:  persistant dizziness or light-headedness   Complete by: As directed    Call MD for:  persistant nausea and vomiting   Complete by: As directed    Call MD for:  severe uncontrolled pain   Complete by: As directed    Call MD for:  temperature >100.4   Complete by: As directed    Discharge instructions   Complete by: As directed    Please review instructions on the discharge summary.  You were cared for by a hospitalist during your hospital stay. If you have any questions about your discharge medications or the care you received while you were in the hospital after you are discharged, you can call the unit and asked to speak with the hospitalist on call if the hospitalist that took care of you is not available. Once you are discharged, your primary care physician will handle any further medical issues. Please note that NO REFILLS for any discharge medications will be authorized once you are discharged, as it is imperative that you return to your primary care physician (or establish a relationship with a primary care physician if you do not have one) for your aftercare needs so that they can  reassess your need for medications and monitor your lab values. If you do not have a primary care physician, you can call [phone] for a physician referral.   Increase activity  slowly   Complete by: As directed         Allergies as of 07/11/2020   No Known Allergies     Medication List    STOP taking these medications   furosemide 80 MG tablet Commonly known as: LASIX   lisinopril 5 MG tablet Commonly known as: ZESTRIL   metFORMIN 500 MG tablet Commonly known as: GLUCOPHAGE   phenol 1.4 % Liqd Commonly known as: CHLORASEPTIC     TAKE these medications   acetaminophen 500 MG tablet Commonly known as: TYLENOL Take 2 tablets (1,000 mg total) by mouth every 8 (eight) hours.   albuterol 108 (90 Base) MCG/ACT inhaler Commonly known as: VENTOLIN HFA Inhale 2 puffs into the lungs every 6 (six) hours as needed. For shortness of breath   amLODipine 5 MG tablet Commonly known as: NORVASC Take 1 tablet (5 mg total) by mouth daily. What changed:   medication strength  how much to take   aspirin EC 81 MG tablet Take 81 mg by mouth daily.   atorvastatin 20 MG tablet Commonly known as: LIPITOR Take 20 mg by mouth at bedtime.   benzocaine-menthol 6-10 MG lozenge Commonly known as: CHLORAEPTIC Take 1 lozenge by mouth every 2 (two) hours as needed for sore throat.   DERMACLOUD EX Apply 1 application topically in the morning and at bedtime. buttocks   docusate sodium 100 MG capsule Commonly known as: COLACE Take 100 mg by mouth 2 (two) times daily as needed (for constipation).   DULoxetine 30 MG capsule Commonly known as: CYMBALTA Take 30 mg by mouth daily.   folic acid 1 MG tablet Commonly known as: FOLVITE Take 1 tablet (1 mg total) by mouth daily.   guaiFENesin 600 MG 12 hr tablet Commonly known as: MUCINEX Take 600 mg by mouth 2 (two) times daily as needed for cough.   ipratropium-albuterol 0.5-2.5 (3) MG/3ML Soln Commonly known as: DUONEB Take 3 mLs by nebulization every 6 (six) hours as needed for shortness of breath or wheezing.   midodrine 5 MG tablet Commonly known as: PROAMATINE Take 1 tablet (5 mg total) by mouth 3  (three) times daily with meals.   nystatin powder Commonly known as: MYCOSTATIN/NYSTOP Apply 1 application topically See admin instructions. Apply to abdominal folds twice daily for redness or rash   ondansetron 4 MG tablet Commonly known as: ZOFRAN Take 4 mg by mouth every 8 (eight) hours as needed for nausea or vomiting.   pantoprazole 20 MG tablet Commonly known as: PROTONIX Take 20 mg by mouth daily.   polyethylene glycol 17 g packet Commonly known as: MIRALAX / GLYCOLAX Take 17 g by mouth daily as needed for mild constipation.   pregabalin 50 MG capsule Commonly known as: LYRICA Take 50 mg by mouth 3 (three) times daily.   Probiotic 250 MG Caps Take 1 capsule by mouth 2 (two) times daily.   traMADol 50 MG tablet Commonly known as: ULTRAM Take 1 tablet (50 mg total) by mouth every 8 (eight) hours as needed for moderate pain.   vitamin B-12 500 MCG tablet Commonly known as: CYANOCOBALAMIN Take 1 tablet (500 mcg total) by mouth daily.   Vitamin D3 125 MCG (5000 UT) Caps Take 5,000 Units by mouth daily.         Follow-up Information  Benito Mccreedy, MD. Schedule an appointment as soon as possible for a visit in 2 week(s).   Specialty: Internal Medicine Contact information: [id number] [place] [address] [phone]               TOTAL DISCHARGE TIME: 21 minutes  [facility] Pager on [email or web address]  07/11/2020, 9:05 AM

## 2020-07-12 DIAGNOSIS — G4733 Obstructive sleep apnea (adult) (pediatric): Secondary | ICD-10-CM | POA: Diagnosis not present

## 2020-07-12 DIAGNOSIS — E119 Type 2 diabetes mellitus without complications: Secondary | ICD-10-CM | POA: Diagnosis not present

## 2020-07-12 DIAGNOSIS — I10 Essential (primary) hypertension: Secondary | ICD-10-CM | POA: Diagnosis not present

## 2020-07-12 DIAGNOSIS — I509 Heart failure, unspecified: Secondary | ICD-10-CM | POA: Diagnosis not present

## 2020-07-12 DIAGNOSIS — S22088S Other fracture of T11-T12 vertebra, sequela: Secondary | ICD-10-CM | POA: Diagnosis not present

## 2020-07-12 DIAGNOSIS — N179 Acute kidney failure, unspecified: Secondary | ICD-10-CM | POA: Diagnosis not present

## 2020-07-12 DIAGNOSIS — J189 Pneumonia, unspecified organism: Secondary | ICD-10-CM | POA: Diagnosis not present

## 2020-07-12 DIAGNOSIS — D649 Anemia, unspecified: Secondary | ICD-10-CM | POA: Diagnosis not present

## 2020-07-12 DIAGNOSIS — R55 Syncope and collapse: Secondary | ICD-10-CM | POA: Diagnosis not present

## 2020-07-12 DIAGNOSIS — E538 Deficiency of other specified B group vitamins: Secondary | ICD-10-CM | POA: Diagnosis not present

## 2020-07-12 DIAGNOSIS — G629 Polyneuropathy, unspecified: Secondary | ICD-10-CM | POA: Diagnosis not present

## 2020-07-13 DIAGNOSIS — J189 Pneumonia, unspecified organism: Secondary | ICD-10-CM | POA: Diagnosis not present

## 2020-07-13 DIAGNOSIS — I10 Essential (primary) hypertension: Secondary | ICD-10-CM | POA: Diagnosis not present

## 2020-07-13 DIAGNOSIS — G4733 Obstructive sleep apnea (adult) (pediatric): Secondary | ICD-10-CM | POA: Diagnosis not present

## 2020-07-13 DIAGNOSIS — N184 Chronic kidney disease, stage 4 (severe): Secondary | ICD-10-CM | POA: Diagnosis not present

## 2020-07-13 DIAGNOSIS — N179 Acute kidney failure, unspecified: Secondary | ICD-10-CM | POA: Diagnosis not present

## 2020-07-13 DIAGNOSIS — E1165 Type 2 diabetes mellitus with hyperglycemia: Secondary | ICD-10-CM | POA: Diagnosis not present

## 2020-07-21 DIAGNOSIS — I10 Essential (primary) hypertension: Secondary | ICD-10-CM | POA: Diagnosis not present

## 2020-07-21 DIAGNOSIS — S22088S Other fracture of T11-T12 vertebra, sequela: Secondary | ICD-10-CM | POA: Diagnosis not present

## 2020-07-21 DIAGNOSIS — Z9981 Dependence on supplemental oxygen: Secondary | ICD-10-CM | POA: Diagnosis not present

## 2020-07-21 DIAGNOSIS — N179 Acute kidney failure, unspecified: Secondary | ICD-10-CM | POA: Diagnosis not present

## 2020-07-21 DIAGNOSIS — I509 Heart failure, unspecified: Secondary | ICD-10-CM | POA: Diagnosis not present

## 2020-07-21 DIAGNOSIS — G629 Polyneuropathy, unspecified: Secondary | ICD-10-CM | POA: Diagnosis not present

## 2020-07-22 DIAGNOSIS — I509 Heart failure, unspecified: Secondary | ICD-10-CM | POA: Diagnosis not present

## 2020-07-22 DIAGNOSIS — G629 Polyneuropathy, unspecified: Secondary | ICD-10-CM | POA: Diagnosis not present

## 2020-07-22 DIAGNOSIS — E119 Type 2 diabetes mellitus without complications: Secondary | ICD-10-CM | POA: Diagnosis not present

## 2020-07-22 DIAGNOSIS — N179 Acute kidney failure, unspecified: Secondary | ICD-10-CM | POA: Diagnosis not present

## 2020-07-22 DIAGNOSIS — S22088S Other fracture of T11-T12 vertebra, sequela: Secondary | ICD-10-CM | POA: Diagnosis not present

## 2020-07-27 DIAGNOSIS — I509 Heart failure, unspecified: Secondary | ICD-10-CM | POA: Diagnosis not present

## 2020-07-27 DIAGNOSIS — N179 Acute kidney failure, unspecified: Secondary | ICD-10-CM | POA: Diagnosis not present

## 2020-07-27 DIAGNOSIS — R609 Edema, unspecified: Secondary | ICD-10-CM | POA: Diagnosis not present

## 2020-07-27 DIAGNOSIS — G629 Polyneuropathy, unspecified: Secondary | ICD-10-CM | POA: Diagnosis not present

## 2020-08-03 DIAGNOSIS — R609 Edema, unspecified: Secondary | ICD-10-CM | POA: Diagnosis not present

## 2020-08-03 DIAGNOSIS — I10 Essential (primary) hypertension: Secondary | ICD-10-CM | POA: Diagnosis not present

## 2020-08-03 DIAGNOSIS — S22088S Other fracture of T11-T12 vertebra, sequela: Secondary | ICD-10-CM | POA: Diagnosis not present

## 2020-08-03 DIAGNOSIS — M25561 Pain in right knee: Secondary | ICD-10-CM | POA: Diagnosis not present

## 2020-08-03 DIAGNOSIS — G629 Polyneuropathy, unspecified: Secondary | ICD-10-CM | POA: Diagnosis not present

## 2020-08-22 DIAGNOSIS — I509 Heart failure, unspecified: Secondary | ICD-10-CM | POA: Diagnosis not present

## 2020-08-22 DIAGNOSIS — I10 Essential (primary) hypertension: Secondary | ICD-10-CM | POA: Diagnosis not present

## 2020-08-22 DIAGNOSIS — R609 Edema, unspecified: Secondary | ICD-10-CM | POA: Diagnosis not present

## 2020-08-22 DIAGNOSIS — G629 Polyneuropathy, unspecified: Secondary | ICD-10-CM | POA: Diagnosis not present

## 2020-08-22 DIAGNOSIS — E119 Type 2 diabetes mellitus without complications: Secondary | ICD-10-CM | POA: Diagnosis not present

## 2020-08-24 DIAGNOSIS — R609 Edema, unspecified: Secondary | ICD-10-CM | POA: Diagnosis not present

## 2020-08-24 DIAGNOSIS — D649 Anemia, unspecified: Secondary | ICD-10-CM | POA: Diagnosis not present

## 2020-08-24 DIAGNOSIS — I10 Essential (primary) hypertension: Secondary | ICD-10-CM | POA: Diagnosis not present

## 2020-08-24 DIAGNOSIS — G629 Polyneuropathy, unspecified: Secondary | ICD-10-CM | POA: Diagnosis not present

## 2020-08-24 DIAGNOSIS — I509 Heart failure, unspecified: Secondary | ICD-10-CM | POA: Diagnosis not present

## 2020-08-30 DIAGNOSIS — I10 Essential (primary) hypertension: Secondary | ICD-10-CM | POA: Diagnosis not present

## 2020-08-30 DIAGNOSIS — R509 Fever, unspecified: Secondary | ICD-10-CM | POA: Diagnosis not present

## 2020-09-18 DIAGNOSIS — G629 Polyneuropathy, unspecified: Secondary | ICD-10-CM | POA: Diagnosis not present

## 2020-09-18 DIAGNOSIS — F320 Major depressive disorder, single episode, mild: Secondary | ICD-10-CM | POA: Diagnosis not present

## 2020-09-18 DIAGNOSIS — G4733 Obstructive sleep apnea (adult) (pediatric): Secondary | ICD-10-CM | POA: Diagnosis not present

## 2020-09-18 DIAGNOSIS — I10 Essential (primary) hypertension: Secondary | ICD-10-CM | POA: Diagnosis not present

## 2020-09-18 DIAGNOSIS — I509 Heart failure, unspecified: Secondary | ICD-10-CM | POA: Diagnosis not present

## 2020-09-18 DIAGNOSIS — M25561 Pain in right knee: Secondary | ICD-10-CM | POA: Diagnosis not present

## 2020-09-21 DIAGNOSIS — G4733 Obstructive sleep apnea (adult) (pediatric): Secondary | ICD-10-CM | POA: Diagnosis not present

## 2020-09-21 DIAGNOSIS — M7989 Other specified soft tissue disorders: Secondary | ICD-10-CM | POA: Diagnosis not present

## 2020-09-21 DIAGNOSIS — I10 Essential (primary) hypertension: Secondary | ICD-10-CM | POA: Diagnosis not present

## 2020-09-21 DIAGNOSIS — N184 Chronic kidney disease, stage 4 (severe): Secondary | ICD-10-CM | POA: Diagnosis not present

## 2020-09-21 DIAGNOSIS — E1165 Type 2 diabetes mellitus with hyperglycemia: Secondary | ICD-10-CM | POA: Diagnosis not present

## 2020-09-21 DIAGNOSIS — J189 Pneumonia, unspecified organism: Secondary | ICD-10-CM | POA: Diagnosis not present

## 2020-09-26 DIAGNOSIS — I509 Heart failure, unspecified: Secondary | ICD-10-CM | POA: Diagnosis not present

## 2020-09-26 DIAGNOSIS — G629 Polyneuropathy, unspecified: Secondary | ICD-10-CM | POA: Diagnosis not present

## 2020-09-26 DIAGNOSIS — R600 Localized edema: Secondary | ICD-10-CM | POA: Diagnosis not present

## 2020-09-26 DIAGNOSIS — F320 Major depressive disorder, single episode, mild: Secondary | ICD-10-CM | POA: Diagnosis not present

## 2020-09-26 DIAGNOSIS — E119 Type 2 diabetes mellitus without complications: Secondary | ICD-10-CM | POA: Diagnosis not present

## 2020-09-27 DIAGNOSIS — D649 Anemia, unspecified: Secondary | ICD-10-CM | POA: Diagnosis not present

## 2020-09-27 DIAGNOSIS — I10 Essential (primary) hypertension: Secondary | ICD-10-CM | POA: Diagnosis not present

## 2020-09-28 DIAGNOSIS — Z13228 Encounter for screening for other metabolic disorders: Secondary | ICD-10-CM | POA: Diagnosis not present

## 2020-09-29 DIAGNOSIS — R601 Generalized edema: Secondary | ICD-10-CM | POA: Diagnosis not present

## 2020-10-04 DIAGNOSIS — I509 Heart failure, unspecified: Secondary | ICD-10-CM | POA: Diagnosis not present

## 2020-10-04 DIAGNOSIS — S22088S Other fracture of T11-T12 vertebra, sequela: Secondary | ICD-10-CM | POA: Diagnosis not present

## 2020-10-04 DIAGNOSIS — Z9981 Dependence on supplemental oxygen: Secondary | ICD-10-CM | POA: Diagnosis not present

## 2020-10-04 DIAGNOSIS — R600 Localized edema: Secondary | ICD-10-CM | POA: Diagnosis not present

## 2020-10-04 DIAGNOSIS — I10 Essential (primary) hypertension: Secondary | ICD-10-CM | POA: Diagnosis not present

## 2020-10-11 DIAGNOSIS — R600 Localized edema: Secondary | ICD-10-CM | POA: Diagnosis not present

## 2020-10-11 DIAGNOSIS — I509 Heart failure, unspecified: Secondary | ICD-10-CM | POA: Diagnosis not present

## 2020-10-11 DIAGNOSIS — G629 Polyneuropathy, unspecified: Secondary | ICD-10-CM | POA: Diagnosis not present

## 2020-10-11 DIAGNOSIS — I10 Essential (primary) hypertension: Secondary | ICD-10-CM | POA: Diagnosis not present

## 2020-10-11 DIAGNOSIS — Z9981 Dependence on supplemental oxygen: Secondary | ICD-10-CM | POA: Diagnosis not present

## 2020-10-21 DIAGNOSIS — Z13228 Encounter for screening for other metabolic disorders: Secondary | ICD-10-CM | POA: Diagnosis not present

## 2020-10-31 DIAGNOSIS — M25561 Pain in right knee: Secondary | ICD-10-CM | POA: Diagnosis not present

## 2020-11-02 DIAGNOSIS — R7309 Other abnormal glucose: Secondary | ICD-10-CM | POA: Diagnosis not present

## 2020-11-30 DIAGNOSIS — N183 Chronic kidney disease, stage 3 unspecified: Secondary | ICD-10-CM | POA: Diagnosis not present

## 2020-11-30 DIAGNOSIS — E1165 Type 2 diabetes mellitus with hyperglycemia: Secondary | ICD-10-CM | POA: Diagnosis not present

## 2020-11-30 DIAGNOSIS — I10 Essential (primary) hypertension: Secondary | ICD-10-CM | POA: Diagnosis not present

## 2020-11-30 DIAGNOSIS — G4733 Obstructive sleep apnea (adult) (pediatric): Secondary | ICD-10-CM | POA: Diagnosis not present

## 2021-01-09 ENCOUNTER — Other Ambulatory Visit: Payer: Self-pay

## 2021-01-09 ENCOUNTER — Emergency Department (HOSPITAL_COMMUNITY)
Admission: EM | Admit: 2021-01-09 | Discharge: 2021-01-28 | Disposition: A | Discharge status: Home / Self Care | Payer: Medicare Other | Attending: Emergency Medicine | Admitting: Emergency Medicine

## 2021-01-09 ENCOUNTER — Encounter (HOSPITAL_COMMUNITY): Payer: Self-pay | Admitting: Emergency Medicine

## 2021-01-09 DIAGNOSIS — R269 Unspecified abnormalities of gait and mobility: Secondary | ICD-10-CM | POA: Diagnosis present

## 2021-01-09 DIAGNOSIS — Z79899 Other long term (current) drug therapy: Secondary | ICD-10-CM | POA: Diagnosis not present

## 2021-01-09 DIAGNOSIS — Z7982 Long term (current) use of aspirin: Secondary | ICD-10-CM | POA: Insufficient documentation

## 2021-01-09 DIAGNOSIS — E1122 Type 2 diabetes mellitus with diabetic chronic kidney disease: Secondary | ICD-10-CM | POA: Insufficient documentation

## 2021-01-09 DIAGNOSIS — I130 Hypertensive heart and chronic kidney disease with heart failure and stage 1 through stage 4 chronic kidney disease, or unspecified chronic kidney disease: Secondary | ICD-10-CM | POA: Diagnosis not present

## 2021-01-09 DIAGNOSIS — R600 Localized edema: Secondary | ICD-10-CM | POA: Diagnosis not present

## 2021-01-09 DIAGNOSIS — Z20822 Contact with and (suspected) exposure to covid-19: Secondary | ICD-10-CM | POA: Diagnosis not present

## 2021-01-09 DIAGNOSIS — I10 Essential (primary) hypertension: Secondary | ICD-10-CM

## 2021-01-09 DIAGNOSIS — J45909 Unspecified asthma, uncomplicated: Secondary | ICD-10-CM | POA: Insufficient documentation

## 2021-01-09 DIAGNOSIS — N182 Chronic kidney disease, stage 2 (mild): Secondary | ICD-10-CM | POA: Diagnosis not present

## 2021-01-09 DIAGNOSIS — I5032 Chronic diastolic (congestive) heart failure: Secondary | ICD-10-CM | POA: Insufficient documentation

## 2021-01-09 DIAGNOSIS — E785 Hyperlipidemia, unspecified: Secondary | ICD-10-CM

## 2021-01-09 LAB — RESP PANEL BY RT-PCR (FLU A&B, COVID) ARPGX2
Influenza A by PCR: NEGATIVE
Influenza B by PCR: NEGATIVE
SARS Coronavirus 2 by RT PCR: NEGATIVE

## 2021-01-09 NOTE — ED Triage Notes (Signed)
Pt here via [facility] after leaving [facility] AMA, pt paid for transport to get him back to his apartment, pt is nonambulatory, unable to care for himself. Pt was told by [facility] that he was not allowed to return since he checked himself out AMA. Pt has no complaints.

## 2021-01-09 NOTE — ED Provider Notes (Signed)
Emergency Medicine Provider Triage Evaluation Note  Brian Silva , a 80 y.o. male  was evaluated in triage.  Pt complains of brought in by PTR after he left [facility] and paid for transport to get him back home.  When he got there he was unable to stand and care for himself.  He is reportedly not allowed to return to [facility] since he checked himself out AMA.  His primary complaint is that he is missing his football game currently. He states that he has not been able to walk recently.  Review of Systems  Positive: Not ambulatory (current baseline) Negative: New weakness  Physical Exam  BP (!) 158/70   Pulse 72   Temp 99 F (37.2 C) (Oral)   Resp 18   SpO2 97%  Gen:   Awake, no distress   Resp:  Normal effort  MSK:   Moves extremities without difficulty  Other:  Patient has poor insight into his situation  Medical Decision Making  Medically screening exam initiated at 9:17 PM.  Appropriate orders placed.  Bethanie Dicker Geeting was informed that the remainder of the evaluation will be completed by another provider, this initial triage assessment does not replace that evaluation, and the importance of remaining in the ED until their evaluation is complete.  Note: Portions of this report may have been transcribed using voice recognition software. Every effort was made to ensure accuracy; however, inadvertent computerized transcription errors may be present    Ollen Gross 01/09/21 2119    Davonna Belling, MD 01/09/21 [phone]

## 2021-01-10 ENCOUNTER — Encounter (HOSPITAL_COMMUNITY): Payer: Self-pay | Admitting: Emergency Medicine

## 2021-01-10 DIAGNOSIS — R269 Unspecified abnormalities of gait and mobility: Secondary | ICD-10-CM | POA: Diagnosis not present

## 2021-01-10 LAB — BASIC METABOLIC PANEL
Anion gap: 12 (ref 5–15)
BUN: 21 mg/dL (ref 8–23)
CO2: 24 mmol/L (ref 22–32)
Calcium: 8.9 mg/dL (ref 8.9–10.3)
Chloride: 103 mmol/L (ref 98–111)
Creatinine, Ser: 1.6 mg/dL — ABNORMAL HIGH (ref 0.61–1.24)
GFR, Estimated: 44 mL/min — ABNORMAL LOW (ref >60)
Glucose, Bld: 137 mg/dL — ABNORMAL HIGH (ref 70–99)
Potassium: 3.6 mmol/L (ref 3.5–5.1)
Sodium: 139 mmol/L (ref 135–145)

## 2021-01-10 LAB — CBC WITH DIFFERENTIAL/PLATELET
Abs Immature Granulocytes: 0.02 10*3/uL (ref 0.00–0.07)
Basophils Absolute: 0.1 10*3/uL (ref 0.0–0.1)
Basophils Relative: 1 %
Eosinophils Absolute: 0.2 10*3/uL (ref 0.0–0.5)
Eosinophils Relative: 2 %
HCT: 30.1 % — ABNORMAL LOW (ref 39.0–52.0)
Hemoglobin: 9.9 g/dL — ABNORMAL LOW (ref 13.0–17.0)
Immature Granulocytes: 0 %
Lymphocytes Relative: 11 %
Lymphs Abs: 1.1 10*3/uL (ref 0.7–4.0)
MCH: 27.7 pg (ref 26.0–34.0)
MCHC: 32.9 g/dL (ref 30.0–36.0)
MCV: 84.1 fL (ref 80.0–100.0)
Monocytes Absolute: 0.7 10*3/uL (ref 0.1–1.0)
Monocytes Relative: 8 %
Neutro Abs: 7.6 10*3/uL (ref 1.7–7.7)
Neutrophils Relative %: 78 %
Platelets: 379 10*3/uL (ref 150–400)
RBC: 3.58 MIL/uL — ABNORMAL LOW (ref 4.22–5.81)
RDW: 14.4 % (ref 11.5–15.5)
WBC: 9.7 10*3/uL (ref 4.0–10.5)
nRBC: 0 % (ref 0.0–0.2)

## 2021-01-10 NOTE — ED Provider Notes (Signed)
[facility] EMERGENCY DEPARTMENT Provider Note   CSN: [id number] Arrival date & time: 01/09/21  1753     History CC:  Can't walk   Brian SAUDER is a 80 y.o. male with a history of morbid obesity, hypertension, hyperlipidemia, presenting from home with concern for ambulatory dysfunction.  The patient reports that he signed himself out of [facility] yesterday because he could no longer afford the co-pay that was required there.  He had been receiving physical therapy at the rehab.  He reports that in the past he was able to ambulate small steps with a walker, but has not been able to do so for the past several days.  He feels that his legs are weak and will give out on him.  He was reportedly brought home by EMS yesterday and determined that he was unable to walk or get about in the house or care for himself, and reports to me does not have any family or friends or caretakers that can come take care of him, and therefore called EMS to bring him back to the ER.  He was triaged and evaluated last night and spent the night in the emergency department waiting room due to prolonged waiting times.  This morning he has no acute complaints when I evaluate him in the ER.  He denies any fevers, chills, chest pain, shortness of breath.  HPI     Past Medical History:  Diagnosis Date   Asthma    Chronic respiratory failure ([place])    on 2L oxygen   Hyperlipidemia    Hypertension    Obese    OSA (obstructive sleep apnea)    Sleep study 08/2010 showed mild to moderate obstructive sleep apnea/hypopnea syndrome. However, never initiated CPAP   Venous stasis    Venous Statis Disease (03/28/2004)    Patient Active Problem List   Diagnosis Date Noted   Bacteremia    Acute renal failure superimposed on stage 2 chronic kidney disease ([place]) [id number]   Metabolic acidosis Q000111Q   Uremia 07/06/2020   Hyperkalemia 07/06/2020   Chronic diastolic CHF (congestive  heart failure) ([place]) 07/06/2020   Pneumonia of both lower lobes due to infectious organism 12/23/2019   CAP (community acquired pneumonia) 12/22/2019   Compression fracture of body of thoracic vertebra ([place]) 09/10/2019   Fall at home, initial encounter 09/10/2019   AKI (acute kidney injury) ([place]) 01/17/2018   Syncope, vasovagal 01/17/2018   Diabetes mellitus type II, non insulin dependent ([place]) 01/17/2018   Hyponatremia 01/17/2018   Syncope 01/17/2018   Depression 10/25/2015   Hyperlipidemia 10/25/2015   TINEA PEDIS 01/04/2010   TINEA VERSICOLOR 01/04/2010   URI 01/04/2010   HYPERGLYCEMIA 12/02/2008   ABDOMINAL WALL HERNIA 12/01/2007   OBESITY, MORBID 04/04/2007   SLEEP APNEA, OBSTRUCTIVE 04/04/2007   LOW BACK PAIN, CHRONIC 12/07/2004   HYPERLIPIDEMIA 08/12/2002   ALLERGIC RHINITIS, SEASONAL 08/12/2002   Essential hypertension 04/01/2000   Asthma 10/11/1997   SUPERFICIAL PHLEBITIS 05/06/1990    History reviewed. No pertinent surgical history.     History reviewed. No pertinent family history.  Social History   Tobacco Use   Smoking status: Never   Smokeless tobacco: Never  Vaping Use   Vaping Use: Never used  Substance Use Topics   Alcohol use: Yes    Comment: occasionally    Drug use: Never    Home Medications Prior to Admission medications   Medication Sig Start Date End Date Taking? Authorizing Provider  acetaminophen (TYLENOL) 500 MG tablet Take 2 tablets (1,000 mg total) by mouth every 8 (eight) hours. 09/14/19   Nita Sells, MD  albuterol (PROVENTIL HFA;VENTOLIN HFA) 108 (90 Base) MCG/ACT inhaler Inhale 2 puffs into the lungs every 6 (six) hours as needed. For shortness of breath 10/28/15   Theodis Blaze, MD  amLODipine (NORVASC) 5 MG tablet Take 1 tablet (5 mg total) by mouth daily. 07/11/20   Bonnielee Haff, MD  aspirin EC 81 MG tablet Take 81 mg by mouth daily.    [provider]  atorvastatin (LIPITOR) 20 MG tablet Take 20 mg by mouth at  bedtime.    [provider]  benzocaine-menthol (CHLORAEPTIC) 6-10 MG lozenge Take 1 lozenge by mouth every 2 (two) hours as needed for sore throat.    [provider]  Cholecalciferol (VITAMIN D3) 125 MCG (5000 UT) CAPS Take 5,000 Units by mouth daily. 06/07/20   [provider]  docusate sodium (COLACE) 100 MG capsule Take 100 mg by mouth 2 (two) times daily as needed (for constipation).    [provider]  DULoxetine (CYMBALTA) 30 MG capsule Take 30 mg by mouth daily.    [provider]  folic acid (FOLVITE) 1 MG tablet Take 1 tablet (1 mg total) by mouth daily. 07/11/20   Bonnielee Haff, MD  guaiFENesin (MUCINEX) 600 MG 12 hr tablet Take 600 mg by mouth 2 (two) times daily as needed for cough.    [provider]  [employer] [facility] EX) Apply 1 application topically in the morning and at bedtime. buttocks    [provider]  ipratropium-albuterol (DUONEB) 0.5-2.5 (3) MG/3ML SOLN Take 3 mLs by nebulization every 6 (six) hours as needed for shortness of breath or wheezing. 07/04/20   [provider]  midodrine (PROAMATINE) 5 MG tablet Take 1 tablet (5 mg total) by mouth 3 (three) times daily with meals. 09/14/19   Nita Sells, MD  nystatin (MYCOSTATIN/NYSTOP) powder Apply 1 application topically See admin instructions. Apply to abdominal folds twice daily for redness or rash    [provider]  ondansetron (ZOFRAN) 4 MG tablet Take 4 mg by mouth every 8 (eight) hours as needed for nausea or vomiting.    [provider]  pantoprazole (PROTONIX) 20 MG tablet Take 20 mg by mouth daily. 06/30/20   [provider]  polyethylene glycol (MIRALAX / GLYCOLAX) 17 g packet Take 17 g by mouth daily as needed for mild constipation.    [provider]  pregabalin (LYRICA) 50 MG capsule Take 50 mg by mouth 3 (three) times daily.    [provider]  Saccharomyces boulardii  (PROBIOTIC) 250 MG CAPS Take 1 capsule by mouth 2 (two) times daily. 06/22/20   [provider]  traMADol (ULTRAM) 50 MG tablet Take 1 tablet (50 mg total) by mouth every 8 (eight) hours as needed for moderate pain. 07/11/20   Bonnielee Haff, MD  vitamin B-12 (CYANOCOBALAMIN) 500 MCG tablet Take 1 tablet (500 mcg total) by mouth daily. 07/11/20   Bonnielee Haff, MD    Allergies    Patient has no known allergies.  Review of Systems   Review of Systems  Constitutional:  Negative for chills and fever.  Eyes:  Negative for pain and visual disturbance.  Respiratory:  Negative for cough and shortness of breath.   Cardiovascular:  Negative for chest pain and palpitations.  Gastrointestinal:  Negative for abdominal pain and vomiting.  Genitourinary:  Negative for dysuria  and hematuria.  Musculoskeletal:  Positive for arthralgias and myalgias.  Skin:  Negative for color change and rash.  Neurological:  Negative for syncope and headaches.  All other systems reviewed and are negative.  Physical Exam Updated Vital Signs BP 132/61 (BP Location: Left Arm)   Pulse 75   Temp 98.7 F (37.1 C) (Oral)   Resp 17   SpO2 95%   Physical Exam Constitutional:      General: He is not in acute distress.    Appearance: He is obese.  HENT:     Head: Normocephalic and atraumatic.  Eyes:     Conjunctiva/sclera: Conjunctivae normal.     Pupils: Pupils are equal, round, and reactive to light.  Cardiovascular:     Rate and Rhythm: Normal rate and regular rhythm.  Pulmonary:     Effort: Pulmonary effort is normal. No respiratory distress.  Abdominal:     General: There is no distension.     Tenderness: There is no abdominal tenderness.  Musculoskeletal:     Comments: Venous stasis edema  Skin:    General: Skin is warm and dry.  Neurological:     General: No focal deficit present.     Mental Status: He is alert. Mental status is at baseline.  Psychiatric:        Mood and Affect: Mood normal.         Behavior: Behavior normal.    ED Results / Procedures / Treatments   Labs (all labs ordered are listed, but only abnormal results are displayed) Labs Reviewed  RESP PANEL BY RT-PCR (FLU A&B, COVID) [id number]    EKG None  Radiology No results found.  Procedures Procedures   Medications Ordered in ED Medications - No data to display  ED Course  I have reviewed the triage vital signs and the nursing notes.  Pertinent labs & imaging results that were available during my care of the patient were reviewed by me and considered in my medical decision making (see chart for details).  Patient is here with ambulatory dysfunction, [facility].  He cites expenses as the reason for signing out.  I spoken to our social worker who is currently reaching out to their facility, to see if there may be other options to help with payment.  Unfortunately the patient reports that he has no family or friends or her caretakers that can assist him at home.  I suspect his weakness likely due to chronic deconditioning, given his size and weight as well.  He likely needs continued physical therapy.  Basic labs and screening labs are pending here.  Have low suspicion for stroke or ACS or pulmonary embolism or blood clot at this time.  Clinical Course as of 01/10/21 1613  Tue Jan 10, 2021  1612 Patient signed out to Dr Glynda Jaeger EDP pending SW follow up regarding placement options. [MT]    Clinical Course User Index [MT] Kamarian Sahakian, Carola Rhine, MD    Final Clinical Impression(s) / ED Diagnoses Final diagnoses:  None    Rx / DC Orders ED Discharge Orders     None        Wyvonnia Dusky, MD 01/10/21 [phone]

## 2021-01-10 NOTE — Progress Notes (Signed)
CSW spoke with admissions at [facility] who told CSW that patient left yesterday AMA. CSW was told when patients leave AMA they don't usually take them back. CSW was told that patient owes them a huge balance for leaving AMA. CSW was told that patient has tried 3 or 4 times before to leave the facility. CSW was told patient has been there for 2 years. Admissions stated patient refuses to turn over his social security to the facility (This is required for all residents in long term care at nursing facilities). CSW was told patient also will not close out his apartment. Admissions stated she is going to speak with administration and call CSW back.

## 2021-01-10 NOTE — ED Notes (Signed)
Patient placed in bed with assistance of hoyer lift

## 2021-01-10 NOTE — ED Provider Notes (Signed)
4:24 PM Care assumed from Dr. Langston Masker.  At time of transfer of care, patient is waiting for evaluation by the case management and social work team to discuss placement back at a facility.  Anticipate follow-up on the recommendations.  Case management suspects he will be able to go back to his facility in the morning but they will need to contact [facility] in the morning.  Patient will board until then.   Brian Silva, Gwenyth Allegra, MD 01/10/21 2236

## 2021-01-10 NOTE — Progress Notes (Signed)
CSW left a message with [facility] with an update

## 2021-01-11 DIAGNOSIS — R269 Unspecified abnormalities of gait and mobility: Secondary | ICD-10-CM | POA: Diagnosis not present

## 2021-01-11 MED ORDER — OXYMETAZOLINE HCL 0.05 % NA SOLN
1.0000 | Freq: Once | NASAL | Status: AC
  Administered 2021-01-11: 1 via NASAL
  Filled 2021-01-11: qty 30

## 2021-01-11 MED ORDER — ASPIRIN EC 81 MG PO TBEC
81.0000 mg | DELAYED_RELEASE_TABLET | Freq: Every day | ORAL | Status: DC
  Administered 2021-01-11 – 2021-01-28 (×18): 81 mg via ORAL
  Filled 2021-01-11 (×18): qty 1

## 2021-01-11 MED ORDER — DULOXETINE HCL 20 MG PO CPEP
20.0000 mg | ORAL_CAPSULE | Freq: Every day | ORAL | Status: DC
  Administered 2021-01-11 – 2021-01-28 (×18): 20 mg via ORAL
  Filled 2021-01-11 (×18): qty 1

## 2021-01-11 MED ORDER — ACETAMINOPHEN 500 MG PO TABS
1000.0000 mg | ORAL_TABLET | Freq: Three times a day (TID) | ORAL | Status: DC | PRN
  Administered 2021-01-20 – 2021-01-22 (×3): 1000 mg via ORAL
  Filled 2021-01-11 (×3): qty 2

## 2021-01-11 MED ORDER — ACETAMINOPHEN 325 MG PO TABS
650.0000 mg | ORAL_TABLET | Freq: Once | ORAL | Status: AC
  Administered 2021-01-11: 650 mg via ORAL
  Filled 2021-01-11: qty 2

## 2021-01-11 MED ORDER — DOCUSATE SODIUM 100 MG PO CAPS
100.0000 mg | ORAL_CAPSULE | Freq: Two times a day (BID) | ORAL | Status: DC | PRN
  Administered 2021-01-26: 100 mg via ORAL
  Filled 2021-01-11: qty 1

## 2021-01-11 MED ORDER — ALBUTEROL SULFATE HFA 108 (90 BASE) MCG/ACT IN AERS: 2.0000 | INHALATION_SPRAY | Freq: Four times a day (QID) | RESPIRATORY_TRACT | Status: DC | PRN

## 2021-01-11 MED ORDER — FUROSEMIDE 20 MG PO TABS
40.0000 mg | ORAL_TABLET | Freq: Two times a day (BID) | ORAL | Status: DC
  Administered 2021-01-12 – 2021-01-28 (×33): 40 mg via ORAL
  Filled 2021-01-11 (×32): qty 2

## 2021-01-11 MED ORDER — ATORVASTATIN CALCIUM 10 MG PO TABS
20.0000 mg | ORAL_TABLET | Freq: Every day | ORAL | Status: DC
  Administered 2021-01-11 – 2021-01-27 (×17): 20 mg via ORAL
  Filled 2021-01-11 (×18): qty 2

## 2021-01-11 MED ORDER — PANTOPRAZOLE SODIUM 20 MG PO TBEC
20.0000 mg | DELAYED_RELEASE_TABLET | Freq: Every day | ORAL | Status: DC
  Administered 2021-01-11 – 2021-01-28 (×18): 20 mg via ORAL
  Filled 2021-01-11 (×17): qty 1

## 2021-01-11 MED ORDER — FOLIC ACID 1 MG PO TABS
1.0000 mg | ORAL_TABLET | Freq: Every day | ORAL | Status: DC
  Administered 2021-01-11 – 2021-01-28 (×18): 1 mg via ORAL
  Filled 2021-01-11 (×17): qty 1

## 2021-01-11 MED ORDER — VITAMIN B-12 1000 MCG PO TABS
500.0000 ug | ORAL_TABLET | Freq: Every day | ORAL | Status: DC
  Administered 2021-01-11 – 2021-01-28 (×18): 500 ug via ORAL
  Filled 2021-01-11 (×18): qty 1

## 2021-01-11 MED ORDER — POLYETHYLENE GLYCOL 3350 17 G PO PACK: 17.0000 g | PACK | Freq: Every day | ORAL | Status: DC | PRN

## 2021-01-11 MED ORDER — PREGABALIN 75 MG PO CAPS
75.0000 mg | ORAL_CAPSULE | Freq: Three times a day (TID) | ORAL | Status: DC
  Administered 2021-01-12 – 2021-01-28 (×47): 75 mg via ORAL
  Filled 2021-01-11 (×11): qty 3
  Filled 2021-01-11: qty 1
  Filled 2021-01-11 (×3): qty 3
  Filled 2021-01-11: qty 1
  Filled 2021-01-11 (×12): qty 3
  Filled 2021-01-11: qty 1
  Filled 2021-01-11 (×19): qty 3

## 2021-01-11 MED ORDER — TRAMADOL HCL 50 MG PO TABS
50.0000 mg | ORAL_TABLET | Freq: Three times a day (TID) | ORAL | Status: DC | PRN
Start: 2021-01-11 — End: 2021-01-28
  Administered 2021-01-16 – 2021-01-17 (×2): 50 mg via ORAL
  Filled 2021-01-11 (×2): qty 1

## 2021-01-11 MED ORDER — IPRATROPIUM-ALBUTEROL 0.5-2.5 (3) MG/3ML IN SOLN: 3.0000 mL | Freq: Four times a day (QID) | RESPIRATORY_TRACT | Status: DC | PRN

## 2021-01-11 MED ORDER — OXYMETAZOLINE HCL 0.05 % NA SOLN
1.0000 | Freq: Once | NASAL | Status: DC
  Filled 2021-01-11: qty 30

## 2021-01-11 MED ORDER — ONDANSETRON 4 MG PO TBDP
4.0000 mg | ORAL_TABLET | Freq: Three times a day (TID) | ORAL | Status: DC | PRN
  Administered 2021-01-11 – 2021-01-26 (×3): 4 mg via ORAL
  Filled 2021-01-11 (×3): qty 1

## 2021-01-11 MED ORDER — AMLODIPINE BESYLATE 5 MG PO TABS
10.0000 mg | ORAL_TABLET | Freq: Every day | ORAL | Status: DC
  Administered 2021-01-11 – 2021-01-28 (×18): 10 mg via ORAL
  Filled 2021-01-11 (×18): qty 2

## 2021-01-11 NOTE — ED Notes (Signed)
Pt changed, new pads and brief was applied. Pt is currently resting. RN is aware of nasal spray for pt.

## 2021-01-11 NOTE — ED Notes (Signed)
Pt incontinent of bowels. Brief changed and peri care provided.

## 2021-01-11 NOTE — Evaluation (Signed)
Physical Therapy Evaluation Patient Details Name: Brian Silva MRN: [id number] DOB: [birth year] Today's Date: 01/11/2021  History of Present Illness  80 y.o. male brought to [facility] ED on 01/09/2021 via EMS due to gait dysfunction. Pt signed out of [facility] on 01/09/2021 and was unable to stand upon return home. PMH includes asthma, HLD, HTN, OSA, obesity, PVD.  Clinical Impression  Pt presents to PT with deficits in functional mobility, strength, power, gait, balance, endurance. Pt requries significant physical assistance to perform bed kobility and is unable to stand at this time despite assistance of 2 persons. Pt reports not having stood in 2 months at SNF, citing lack of opportunity to move for his gradual decline over the past year. Pt will benefit from acute PT services to attempt to improve mobility quality and LE strength in an effort to progress to standing activities. PT recommends SNF placement for long term care, patient's current functional deficits are significant and a return to a level of household mobility does not seem a reasonable short term goal at this time.       Recommendations for follow up therapy are one component of a multi-disciplinary discharge planning process, led by the attending physician.  Recommendations may be updated based on patient status, additional functional criteria and insurance authorization.  Follow Up Recommendations SNF (custodial care)    Equipment Recommendations  Wheelchair (measurements PT);Wheelchair cushion (measurements PT);Hospital bed (hoyer lift)    Recommendations for Other Services       Precautions / Restrictions Precautions Precautions: Fall Restrictions Weight Bearing Restrictions: No      Mobility  Bed Mobility Overal bed mobility: Needs Assistance Bed Mobility: Supine to Sit;Sit to Supine     Supine to sit: Mod assist;HOB elevated Sit to supine: Max assist        Transfers Overall transfer level: Needs  assistance Equipment used: 2 person hand held assist Transfers: Sit to/from Stand Sit to Stand: Total assist (unable to stand)         General transfer comment: PT and RN provide bilateral knee block and UE support, pt able to clear buttocks from bed by ~1 inch but unable to stand further  Ambulation/Gait                Stairs            Wheelchair Mobility    Modified Rankin (Stroke Patients Only)       Balance Overall balance assessment: Needs assistance Sitting-balance support: No upper extremity supported;Feet supported Sitting balance-Leahy Scale: Fair                                       Pertinent Vitals/Pain Pain Assessment: Faces Faces Pain Scale: Hurts little more Pain Location: low back Pain Descriptors / Indicators: Aching Pain Intervention(s): Monitored during session    Home Living Family/patient expects to be discharged to:: Skilled nursing facility                 Additional Comments: pt was at [facility] for over one year, signed himself out on 01/09/2021    Prior Function Level of Independence: Needs assistance   Gait / Transfers Assistance Needed: pt reports he was ambulatory at time of admission to [facility], has declined since then and has been unable to walk, also unable to stand for at least 2 months  ADL's / Homemaking Assistance Needed: pt requires assistance  for all ADLs        Hand Dominance        Extremity/Trunk Assessment   Upper Extremity Assessment Upper Extremity Assessment: Generalized weakness    Lower Extremity Assessment Lower Extremity Assessment: Generalized weakness    Cervical / Trunk Assessment Cervical / Trunk Assessment: Other exceptions Cervical / Trunk Exceptions: morbid obesity  Communication   Communication: No difficulties  Cognition Arousal/Alertness: Awake/alert Behavior During Therapy: WFL for tasks assessed/performed Overall Cognitive Status: Within  Functional Limits for tasks assessed                                        General Comments General comments (skin integrity, edema, etc.): VSS on RA    Exercises     Assessment/Plan    PT Assessment Patient needs continued PT services  PT Problem List Decreased strength;Decreased activity tolerance;Decreased balance;Decreased mobility;Decreased knowledge of use of DME       PT Treatment Interventions DME instruction;Gait training;Functional mobility training;Therapeutic activities;Neuromuscular re-education;Balance training;Therapeutic exercise;Patient/family education;Wheelchair mobility training    PT Goals (Current goals can be found in the Care Plan section)  Acute Rehab PT Goals Patient Stated Goal: to regain mobility and eventually return home PT Goal Formulation: With patient Time For Goal Achievement: 01/25/21 Potential to Achieve Goals: Poor    Frequency Min 2X/week   Barriers to discharge Decreased caregiver support      Co-evaluation               AM-PAC PT "6 Clicks" Mobility  Outcome Measure Help needed turning from your back to your side while in a flat bed without using bedrails?: A Lot Help needed moving from lying on your back to sitting on the side of a flat bed without using bedrails?: A Lot Help needed moving to and from a bed to a chair (including a wheelchair)?: Total Help needed standing up from a chair using your arms (e.g., wheelchair or bedside chair)?: Total Help needed to walk in hospital room?: Total Help needed climbing 3-5 steps with a railing? : Total 6 Click Score: 8    End of Session   Activity Tolerance: Patient tolerated treatment well Patient left: in bed;with call bell/phone within reach Nurse Communication: Mobility status;Need for lift equipment PT Visit Diagnosis: Other abnormalities of gait and mobility (R26.89);Muscle weakness (generalized) (M62.81);Difficulty in walking, not elsewhere classified  (R26.2)    Time: [id number] PT Time Calculation (min) (ACUTE ONLY): 21 min   Charges:   PT Evaluation $PT Eval Low Complexity: 1 Low          Zenaida Niece, PT, DPT Acute Rehabilitation Pager: [phone]   Zenaida Niece 01/11/2021, 5:45 PM

## 2021-01-11 NOTE — ED Notes (Signed)
Pt called out. Emesis x1 with nausea. Pt states that his stomach has been hurting him all day. EDP made aware. EDP Going to order home meds and nausea medication.

## 2021-01-11 NOTE — ED Notes (Signed)
Report given to [place], RN

## 2021-01-11 NOTE — Progress Notes (Signed)
CSW spoke with Cori Razor the administrator from [facility]. CSW was told that patient owes them 10,000 dollars and left AMA. [facility] stated they would take patient back if he paid 30% of his balance and signed over his SSI check to them. Nicole Kindred stated he would call around and help to see if other facilities would accept him.

## 2021-01-11 NOTE — ED Notes (Addendum)
I introduced myself to pt. Pt AxO x4 with a GCS of 15. Pt ate 100% of lunch tray. Pt has been using urinal to urinate. Pt asked for coke, which I gave him. Denies further needs. Asks for door to stay shut when we leave. Resting in the stretcher comfortably. Call light remains within reach. Side rails up.

## 2021-01-11 NOTE — NC FL2 (Signed)
[place] MEDICAID FL2 LEVEL OF CARE SCREENING TOOL     IDENTIFICATION  Patient Name: Brian Silva Birthdate: November 06, 1940 Sex: male Admission Date (Current Location): 01/09/2021  [facility] and [place] Number:  Herbalist and Address:  [facility] [place], [place] [address] [place], [place] [address]      Provider Number: [id number]  Attending Physician Name and Address:  Default, Provider, MD  Relative Name and Phone Number:  N/A    Current Level of Care: Hospital Recommended Level of Care: [place], Other (Comment) (Long term) Prior Approval Number:    Date Approved/Denied:   PASRR Number: [id number]  Discharge Plan: SNF (Long term facility)    Current Diagnoses: Patient Active Problem List   Diagnosis Date Noted   Bacteremia    Acute renal failure superimposed on stage 2 chronic kidney disease ([place]) Q000111Q   Metabolic acidosis Q000111Q   Uremia 07/06/2020   Hyperkalemia 07/06/2020   Chronic diastolic CHF (congestive heart failure) ([place]) 07/06/2020   Pneumonia of both lower lobes due to infectious organism 12/23/2019   CAP (community acquired pneumonia) 12/22/2019   Compression fracture of body of thoracic vertebra ([place]) 09/10/2019   Fall at home, initial encounter 09/10/2019   AKI (acute kidney injury) ([place]) 01/17/2018   Syncope, vasovagal 01/17/2018   Diabetes mellitus type II, non insulin dependent ([place]) 01/17/2018   Hyponatremia 01/17/2018   Syncope 01/17/2018   Depression 10/25/2015   Hyperlipidemia 10/25/2015   TINEA PEDIS 01/04/2010   TINEA VERSICOLOR 01/04/2010   URI 01/04/2010   HYPERGLYCEMIA 12/02/2008   ABDOMINAL WALL HERNIA 12/01/2007   OBESITY, MORBID 04/04/2007   SLEEP APNEA, OBSTRUCTIVE 04/04/2007   LOW BACK PAIN, CHRONIC 12/07/2004   HYPERLIPIDEMIA 08/12/2002   ALLERGIC RHINITIS, SEASONAL 08/12/2002   Essential hypertension 04/01/2000   Asthma 10/11/1997   SUPERFICIAL PHLEBITIS 05/06/1990     Orientation RESPIRATION BLADDER Height & Weight     Self, Time, Situation, Place  Normal Incontinent Weight:   Height:     BEHAVIORAL SYMPTOMS/MOOD NEUROLOGICAL BOWEL NUTRITION STATUS      Incontinent Diet (regular)  AMBULATORY STATUS COMMUNICATION OF NEEDS Skin   Extensive Assist Verbally Normal                       Personal Care Assistance Level of Assistance  Bathing, Feeding, Dressing Bathing Assistance: Limited assistance Feeding assistance: Independent Dressing Assistance: Limited assistance     Functional Limitations Info  Speech, Hearing, Sight Sight Info: Adequate Hearing Info: Adequate Speech Info: Adequate    SPECIAL CARE FACTORS FREQUENCY  PT (By licensed PT), OT (By licensed OT)     PT Frequency: 5x weekly OT Frequency: 5x weekly            Contractures Contractures Info: Not present    Additional Factors Info  Code Status Code Status Info: Full             Current Medications (01/11/2021):  This is the current hospital active medication list No current facility-administered medications for this encounter.   Current Outpatient Medications  Medication Sig Dispense Refill   amLODipine (NORVASC) 10 MG tablet Take 10 mg by mouth daily.     DULoxetine (CYMBALTA) 20 MG capsule Take 20 mg by mouth daily.     furosemide (LASIX) 40 MG tablet Take 40 mg by mouth 2 (two) times daily.     Menthol, Topical Analgesic, (BIOFREEZE) 4 % GEL Apply 1 application topically every 6 (six)  hours as needed (joint pain).     pregabalin (LYRICA) 75 MG capsule Take 75 mg by mouth 3 (three) times daily.       Discharge Medications: Please see discharge summary for a list of discharge medications.  Relevant Imaging Results:  Relevant Lab Results:   Additional Information SSN [id number] Pt has had 2 Covid vaccines plus booster  Vergie Living, LCSW

## 2021-01-11 NOTE — Progress Notes (Signed)
CSW met with Pt at bedside. Pt states that he is willing to give up SSI to whatever facility is willing to accept him as SNF/long-term. CSW made clear to Pt that several obstacles still exist to placement. CSW made a detailed explanation in regards to bariatric needs, his history of leaving AMA and other charting issues. Pt  verbalized understanding of such barriers.

## 2021-01-11 NOTE — Progress Notes (Signed)
CSW sent out SNF referrals.

## 2021-01-11 NOTE — ED Notes (Signed)
Patient received meal tray 

## 2021-01-11 NOTE — ED Notes (Signed)
Pt resting comfortably at this time.

## 2021-01-11 NOTE — ED Notes (Signed)
Patient moved to room 48. Introduced self to patient. Instructed patient on how to use remote for bed and TV. Pt has no requests or needs at this time. Aox4. Pt requesting door to be closed.

## 2021-01-11 NOTE — ED Notes (Signed)
Breakfast tray ordered for pt

## 2021-01-11 NOTE — Progress Notes (Signed)
CSW spoke with patient and he stated he was not willing to give up his SSI to any facility. CSW explained that he has the option to go home or give up his check. CSW stated she cannot place him until he gives up his check. Patient stated he could not do that then asked CSW about a motorized wheelchair. CSW told patient she would check back in about his decision and if he still chooses not to give up his check he will discharge home. CSW discussed with [facility] leadership about patient being discharged if he refuses to give up his check     CSW spoke with patients friend Adriane who stated she has been helping him for the last 2 years and told him not to leave [facility] and he did not listen. Adriane stated she could not provide any support

## 2021-01-11 NOTE — ED Notes (Signed)
Pt provided with towels and soap and water basin. Pt washed himself up in the bed. New gown provided to pt.

## 2021-01-11 NOTE — ED Notes (Signed)
CSW at bedside.

## 2021-01-12 DIAGNOSIS — R269 Unspecified abnormalities of gait and mobility: Secondary | ICD-10-CM | POA: Diagnosis not present

## 2021-01-12 NOTE — ED Notes (Signed)
Turned to left side lying

## 2021-01-12 NOTE — Progress Notes (Signed)
CSW went over bed offers with patient. CSW explained to patient that at [facility] he will need use all his income and pay a liability fee. Patient will have to use all his SSI money minus $270 dollars a month. Patient would like to try [facility] first and [place] as back-ups

## 2021-01-12 NOTE — ED Notes (Signed)
Patient cleaned up after a bowel movement in the bed. New linens placed on the bed.

## 2021-01-12 NOTE — ED Notes (Signed)
Patient is sitting up eating lunch denies needs at this time.

## 2021-01-12 NOTE — ED Provider Notes (Signed)
Patient is here attempting to get placement to skilled nursing facility.  Return to the emergency room for placement after signing himself out of his to skilled nursing facility the previous day.  No acute medical issues noted.  Patient remains stable.     Dorie Rank, MD 01/12/21 [phone]

## 2021-01-12 NOTE — Progress Notes (Signed)
Patient accepted at [facility]. Admissions plans to start insurance authorization that will determine placement.

## 2021-01-12 NOTE — ED Notes (Signed)
Laying on right side at this time

## 2021-01-12 NOTE — ED Notes (Signed)
Turned on back at this time

## 2021-01-13 DIAGNOSIS — R269 Unspecified abnormalities of gait and mobility: Secondary | ICD-10-CM | POA: Diagnosis not present

## 2021-01-13 NOTE — Progress Notes (Signed)
[id number] Brian Silva from [facility] has not arrived at ED as yet. CSW called and left voicemail  5:15 CSW texted Brian Silva.  6:28 CSW called [name] and left second voicemail

## 2021-01-13 NOTE — Progress Notes (Signed)
CSW called Debbie at [facility] to check on insurance auth. Left voicemail requesting callback.

## 2021-01-13 NOTE — Progress Notes (Addendum)
CSW met with Pt at bedside to inform him that [place] from [facility] is coming to sign paperwork with him.  Pt verbalized understanding that he must sign over his income to go to [facility] as a long-term patient. CSW reiterated parameters of such an agreement ([facility] becomes rep payee, Pt will receive $33/monthly) Pt again states agreement with these terms. Pt expressed concern for how he would be transported to [facility]. CSW informed him that he could go via [facility]. RN updated that [name] is expected between 3 and 4 pm.

## 2021-01-14 DIAGNOSIS — R269 Unspecified abnormalities of gait and mobility: Secondary | ICD-10-CM | POA: Diagnosis not present

## 2021-01-14 NOTE — ED Notes (Signed)
Introduced self to patient. Applied blanket to feet. Will continue to monitor.

## 2021-01-14 NOTE — ED Notes (Signed)
Gave pt a bedside a bed bath at this time. Linen and down changed

## 2021-01-15 DIAGNOSIS — R269 Unspecified abnormalities of gait and mobility: Secondary | ICD-10-CM | POA: Diagnosis not present

## 2021-01-15 NOTE — ED Notes (Signed)
Pt resting in bed, denies needs at this time. Denies pain. No distress noted. Pulling AM meds. Pt had breakfast. RN provided additional drink per request.

## 2021-01-15 NOTE — ED Notes (Signed)
Pt sitting up in bed watching tv. Defers RN assessing for incontinence, states "No, I'm clean, it's okay." Declined repositioning. RN provided requested drink. Pt denies further needs. No distress noted. Call light in reach.

## 2021-01-15 NOTE — ED Notes (Signed)
Received verbal report from Gloria G RN at this time 

## 2021-01-15 NOTE — ED Provider Notes (Signed)
Emergency Medicine Observation Re-evaluation Note  Brian Silva is a 80 y.o. male, seen on rounds today at 37.  Pt initially presented to the ED for complaints of No chief complaint on file. Currently, the patient is resting comfortably.  Physical Exam  BP (!) 125/94   Pulse 71   Temp 98 F (36.7 C) (Oral)   Resp 16   SpO2 95%  Physical Exam General: NAD   ED Course / MDM  EKG:   I have reviewed the labs performed to date as well as medications administered while in observation.  Recent changes in the last 24 hours include no acute events reported by staff.  Plan  Current plan is for pending skilled nursing placement at [facility].   Brian Silva is not under involuntary commitment.     Valarie Merino, MD 01/15/21 [phone]

## 2021-01-16 DIAGNOSIS — R269 Unspecified abnormalities of gait and mobility: Secondary | ICD-10-CM | POA: Diagnosis not present

## 2021-01-16 NOTE — Progress Notes (Signed)
CSW called Debbie from [facility] to ascertain what is happening with placement. Left message requesting callback.

## 2021-01-16 NOTE — Progress Notes (Signed)
CSW spoke with [place] from [facility]. Debbie reports that due to car issues she was unable to come to ED on Friday and will not be able to come to ED until Tuesday to sign paperwork for Pt placement.  Pt will need to see PT for eval tomorrow, Pt will also need updated Covid test.

## 2021-01-16 NOTE — Progress Notes (Signed)
PT Cancellation Note  Patient Details Name: Brian Silva MRN: [id number] DOB: 22-Apr-1941   Cancelled Treatment:    Reason Eval/Treat Not Completed: Patient declined, no reason specified  Pt declining moving OOB to the chair, stating he wants to rest today;  States he will participate tomorrow;   [name], [place] Pager [phone] Office [phone]    [name] 01/16/2021, 11:12 AM

## 2021-01-16 NOTE — ED Provider Notes (Signed)
Emergency Medicine Observation Re-evaluation Note  Brian Silva is a 80 y.o. male, seen on rounds today.  Pt initially presented to the ED for complaints of No chief complaint on file. Currently, the patient is resting.  Physical Exam  BP (!) 110/52 (BP Location: Left Arm)   Pulse 77   Temp 98.7 F (37.1 C)   Resp 16   SpO2 93%  Physical Exam General: nad Cardiac: warm well perfused Lungs: even unlabored Psych: calm  ED Course / MDM  EKG:   I have reviewed the labs performed to date as well as medications administered while in observation.  Recent changes in the last 24 hours include no major events, no notes from SW/CM yesterday, per review of chart looking for placement.  Plan  Current plan is for SNF placement.  Brian Silva is not under involuntary commitment.     Lucrezia Starch, MD 01/16/21 [phone]

## 2021-01-16 NOTE — ED Notes (Signed)
Pt requesting pain medication at this time.

## 2021-01-17 DIAGNOSIS — R269 Unspecified abnormalities of gait and mobility: Secondary | ICD-10-CM | POA: Diagnosis not present

## 2021-01-17 NOTE — ED Notes (Signed)
Pt complaining of 6/10 dental pain and asking for a dentist. Pt informed a dentist was not available but I could offer him tramadol. He agreed.

## 2021-01-17 NOTE — Progress Notes (Signed)
Physical Therapy Treatment Patient Details Name: GEON CLAUDE MRN: [id number] DOB: 05/25/1940 Today's Date: 01/17/2021   History of Present Illness 80 y.o. male brought to [facility] ED on 01/09/2021 via EMS due to gait dysfunction. Pt signed out of [facility] on 01/09/2021 and was unable to stand upon return home. PMH includes asthma, HLD, HTN, OSA, obesity, PVD.    PT Comments    Pt progressing towards goals. Attempted to stand using stedy, however, pt unable to successfully use. Used recliner handles with PT/OT blocking BLE during next attempts and was able to stand X2. Required max A +2 for assist. Pt very motivated to regain independence. Current recommendations appropriate. Will continue to follow acutely.      Recommendations for follow up therapy are one component of a multi-disciplinary discharge planning process, led by the attending physician.  Recommendations may be updated based on patient status, additional functional criteria and insurance authorization.  Follow Up Recommendations  SNF     Equipment Recommendations  Wheelchair (measurements PT);Wheelchair cushion (measurements PT);Hospital bed;Other (comment) (hoyer lift)    Recommendations for Other Services       Precautions / Restrictions Precautions Precautions: Fall Restrictions Weight Bearing Restrictions: No     Mobility  Bed Mobility Overal bed mobility: Needs Assistance Bed Mobility: Supine to Sit;Sit to Supine     Supine to sit: Min assist;HOB elevated Sit to supine: Max assist   General bed mobility comments: Required min A for trunk assist and LE assist to come to sitting. PT using bed rails to assist. Max A for LE and trunk assist to return to supine.    Transfers Overall transfer level: Needs assistance Equipment used:  (recliner handles) Transfers: Sit to/from Stand Sit to Stand: Max assist;+2 physical assistance         General transfer comment: PT/OT blocked BLE and had pt pull up on  recliner handles. Was able to successfully stand X2 with max A +2. Pt only able to maintain standing for brief periods and used rocking technique to stand. Attempted with stedy X2, however, pt did not fit between bars.  Ambulation/Gait                 Stairs             Wheelchair Mobility    Modified Rankin (Stroke Patients Only)       Balance Overall balance assessment: Needs assistance Sitting-balance support: No upper extremity supported Sitting balance-Leahy Scale: Fair     Standing balance support: Bilateral upper extremity supported Standing balance-Leahy Scale: Poor Standing balance comment: Reliant on UE and external support                            Cognition Arousal/Alertness: Awake/alert Behavior During Therapy: WFL for tasks assessed/performed Overall Cognitive Status: Within Functional Limits for tasks assessed                                        Exercises      General Comments        Pertinent Vitals/Pain Pain Assessment: Faces Faces Pain Scale: Hurts little more Pain Location: low back Pain Descriptors / Indicators: Aching Pain Intervention(s): Limited activity within patient's tolerance;Monitored during session;Repositioned    Home Living  Prior Function            PT Goals (current goals can now be found in the care plan section) Acute Rehab PT Goals Patient Stated Goal: to regain mobility and eventually return home PT Goal Formulation: With patient Time For Goal Achievement: 01/25/21 Potential to Achieve Goals: Fair Progress towards PT goals: Progressing toward goals    Frequency    Min 2X/week      PT Plan Current plan remains appropriate    Co-evaluation              AM-PAC PT "6 Clicks" Mobility   Outcome Measure  Help needed turning from your back to your side while in a flat bed without using bedrails?: A Little Help needed moving from  lying on your back to sitting on the side of a flat bed without using bedrails?: A Lot Help needed moving to and from a bed to a chair (including a wheelchair)?: Total Help needed standing up from a chair using your arms (e.g., wheelchair or bedside chair)?: Total Help needed to walk in hospital room?: Total Help needed climbing 3-5 steps with a railing? : Total 6 Click Score: 9    End of Session Equipment Utilized During Treatment: Gait belt Activity Tolerance: Patient tolerated treatment well Patient left: in bed;with call bell/phone within reach (in bed in ED) Nurse Communication: Mobility status PT Visit Diagnosis: Other abnormalities of gait and mobility (R26.89);Muscle weakness (generalized) (M62.81);Difficulty in walking, not elsewhere classified (R26.2)     Time: [id number] PT Time Calculation (min) (ACUTE ONLY): 31 min  Charges:  $Therapeutic Activity: 8-22 mins                     Lou Miner, DPT  Acute Rehabilitation Services  Pager: [phone] Office: [phone]    Rudean Hitt 01/17/2021, 10:09 AM

## 2021-01-17 NOTE — ED Notes (Signed)
OT at bedside. 

## 2021-01-17 NOTE — Evaluation (Signed)
Occupational Therapy Evaluation Patient Details Name: Brian Silva MRN: [id number] DOB: 12-04-1940 Today's Date: 01/17/2021   History of Present Illness 80 y.o. male brought to [facility] ED on 01/09/2021 via EMS due to gait dysfunction. Pt signed out of [facility] on 01/09/2021 and was unable to stand upon return home. PMH includes asthma, HLD, HTN, OSA, obesity, PVD.   Clinical Impression   Pt recently at SNF and dc'ed home. Was needing assist for ADL and while he could perform bed mobility, was not able to take steps since before he initially went SNF. Today he is min to max for bed mobility (min to come EOB, max to return supine) he is total A for LB ADL, mod A to assist with ADL behind his back, able to perform front of body ADL. He is motivated and was able to attempt sit<>stand x4 with 2 successful brief full stands. He is too wide for bari stedy but did well with pulling up on the back of the recliner in conjunction with rocking momentum and lift from bed pad and max A +2, He assisted with pulling up in bed for repositioning at end of session. Pt will require SNF post-acute to maximize safety and independence in ADL and functional transfers.      Recommendations for follow up therapy are one component of a multi-disciplinary discharge planning process, led by the attending physician.  Recommendations may be updated based on patient status, additional functional criteria and insurance authorization.   Follow Up Recommendations  SNF    Equipment Recommendations  Other (comment) (defer to next venue of care)    Recommendations for Other Services       Precautions / Restrictions Precautions Precautions: Fall Restrictions Weight Bearing Restrictions: No      Mobility Bed Mobility Overal bed mobility: Needs Assistance Bed Mobility: Supine to Sit;Sit to Supine     Supine to sit: Min assist;HOB elevated Sit to supine: Max assist   General bed mobility comments: Required min A  for trunk assist and LE assist to come to sitting. PT using bed rails to assist. Max A for LE and trunk assist to return to supine.    Transfers Overall transfer level: Needs assistance Equipment used:  (recliner handles; body habitus too big for [employer]eralene Huff) Transfers: Sit to/from Stand Sit to Stand: Max assist;+2 physical assistance         General transfer comment: PT/OT blocked BLE and had pt pull up on recliner handles. Was able to successfully stand X2 with max A +2. Pt only able to maintain standing for brief periods and used rocking technique to stand. Attempted with bari stedy X2, however, pt did not fit between bars.    Balance Overall balance assessment: Needs assistance Sitting-balance support: No upper extremity supported Sitting balance-Leahy Scale: Fair     Standing balance support: Bilateral upper extremity supported Standing balance-Leahy Scale: Poor Standing balance comment: Reliant on UE and external support                           ADL either performed or assessed with clinical judgement   ADL Overall ADL's : Needs assistance/impaired Eating/Feeding: Set up   Grooming: Wash/dry face;Brushing hair;Min guard;Sitting Grooming Details (indicate cue type and reason): EOB, had to assist with knots in the hair - Pt ROM WFL for BUE for ADL Upper Body Bathing: Moderate assistance   Lower Body Bathing: Total assistance   Upper Body Dressing : Moderate  assistance   Lower Body Dressing: Total assistance   Toilet Transfer: Maximal assistance;+2 for physical assistance;+2 for safety/equipment   Toileting- Clothing Manipulation and Hygiene: Maximal assistance       Functional mobility during ADLs: Maximal assistance;+2 for physical assistance;+2 for safety/equipment General ADL Comments: decreased access to LB for ADL, decreased activity tolerance     Vision Baseline Vision/History: 1 Wears glasses Ability to See in Adequate Light: 0  Adequate Patient Visual Report: No change from baseline       Perception     Praxis      Pertinent Vitals/Pain Pain Assessment: Faces Faces Pain Scale: Hurts little more Pain Location: low back Pain Descriptors / Indicators: Aching Pain Intervention(s): Monitored during session;Repositioned     Hand Dominance Right   Extremity/Trunk Assessment Upper Extremity Assessment Upper Extremity Assessment: Generalized weakness   Lower Extremity Assessment Lower Extremity Assessment: Defer to PT evaluation   Cervical / Trunk Assessment Cervical / Trunk Assessment: Other exceptions Cervical / Trunk Exceptions: morbid obesity   Communication Communication Communication: No difficulties   Cognition Arousal/Alertness: Awake/alert Behavior During Therapy: WFL for tasks assessed/performed Overall Cognitive Status: Within Functional Limits for tasks assessed                                     General Comments  VSS on RA, Pt motivated    Exercises     Shoulder Instructions      Home Living Family/patient expects to be discharged to:: Skilled nursing facility                                 Additional Comments: pt was at [facility] for over one year, signed himself out on 01/09/2021      Prior Functioning/Environment Level of Independence: Needs assistance  Gait / Transfers Assistance Needed: pt reports he was ambulatory at time of admission to [facility], has declined since then and has been unable to walk, also unable to stand for at least 2 months ADL's / Homemaking Assistance Needed: pt requires assistance for all ADLs            OT Problem List: Decreased strength;Decreased range of motion;Decreased activity tolerance;Impaired balance (sitting and/or standing);Decreased knowledge of use of DME or AE;Obesity;Increased edema      OT Treatment/Interventions: Self-care/ADL training;Therapeutic exercise;DME and/or AE instruction;Therapeutic  activities;Balance training;Patient/family education    OT Goals(Current goals can be found in the care plan section) Acute Rehab OT Goals Patient Stated Goal: to regain mobility and eventually return home OT Goal Formulation: With patient Time For Goal Achievement: 01/31/21 Potential to Achieve Goals: Good  OT Frequency: Min 2X/week   Barriers to D/C: Decreased caregiver support          Co-evaluation PT/OT/SLP Co-Evaluation/Treatment: Yes Reason for Co-Treatment: For patient/therapist safety;To address functional/ADL transfers PT goals addressed during session: Mobility/safety with mobility;Balance;Strengthening/ROM OT goals addressed during session: ADL's and self-care;Strengthening/ROM;Proper use of Adaptive equipment and DME      AM-PAC OT "6 Clicks" Daily Activity     Outcome Measure Help from another person eating meals?: A Little Help from another person taking care of personal grooming?: A Little Help from another person toileting, which includes using toliet, bedpan, or urinal?: Total Help from another person bathing (including washing, rinsing, drying)?: A Lot Help from another person to put on and taking off regular upper  body clothing?: A Lot Help from another person to put on and taking off regular lower body clothing?: Total 6 Click Score: 12   End of Session Equipment Utilized During Treatment: Gait belt;Other (comment) (back of recliner for pulling up into stand) Nurse Communication: Mobility status  Activity Tolerance: Patient tolerated treatment well Patient left: in bed;with call bell/phone within reach  OT Visit Diagnosis: Unsteadiness on feet (R26.81);Other abnormalities of gait and mobility (R26.89);Muscle weakness (generalized) (M62.81)                Time: [id number] OT Time Calculation (min): 31 min Charges:  OT General Charges $OT Visit: 1 Visit OT Evaluation $OT Eval Moderate Complexity: [place] OTR/L Acute Rehabilitation  Services Pager: [phone] Office: [place] 01/17/2021, 11:28 AM

## 2021-01-18 DIAGNOSIS — R269 Unspecified abnormalities of gait and mobility: Secondary | ICD-10-CM | POA: Diagnosis not present

## 2021-01-18 NOTE — ED Notes (Signed)
CSW at bedside.

## 2021-01-18 NOTE — ED Notes (Signed)
Debbie from [place] [facility] at bedside assessing pt for placement

## 2021-01-18 NOTE — Progress Notes (Signed)
CSW attempted to start auth and discovered that Pt's [facility] Medicare expires 9/30. CSW met with Pt at bedside and worked to remedy situation by [employer], [employer], [employer] and others.  Pt appears to have been victimized by fraud scheme that enrolled him in alternative healthcare plan without his knowledge while he was residing in [place].   CSW was able to assist Pt in cancelling unnecessary and fraudulent policy and to make an appointment for an agent to come to ED tomorrow 9/29 to reinstate [facility] Medicare thus avoiding cancellation deadline.  Pt's Medicare # [id number]

## 2021-01-18 NOTE — ED Notes (Signed)
Pt ao x 4, NAD. Denies any complaints at this time. Urinal emptied, trash discarded. Pt requested Diet Coke, given.

## 2021-01-19 DIAGNOSIS — R269 Unspecified abnormalities of gait and mobility: Secondary | ICD-10-CM | POA: Diagnosis not present

## 2021-01-19 NOTE — Progress Notes (Signed)
CSW called Debbie @ [facility] to discuss Pt. Left message. CSW called Tanya @ [facility] to discuss Pt. Left Message.

## 2021-01-19 NOTE — Progress Notes (Signed)
CSW met with Brian Silva at bedside to continue work on insurance issues. Per [employer] Medicare Brian Silva's insurance will be renewed back to old policy as other unauthorized insurance has been canceled ([employer] cancellation confirmation # [id number]).  It may take up to 10 days for [employer] Medicare Advantage coverage to be reinstated, until that time, Brian Silva will still be covered under regular Medicare # [id number] 

## 2021-01-19 NOTE — ED Notes (Signed)
Pt cleaned with partial linen change

## 2021-01-20 DIAGNOSIS — R269 Unspecified abnormalities of gait and mobility: Secondary | ICD-10-CM | POA: Diagnosis not present

## 2021-01-20 NOTE — Progress Notes (Signed)
CSW spoke with Lavella Lemons of [place] to discuss Pt and ensure Lavella Lemons that insurance issue has been resolved. Lavella Lemons states that she will review Pt for possible placement.

## 2021-01-20 NOTE — Progress Notes (Signed)
CSW called Debbie @ [facility] in an effort to get information on possible placement.

## 2021-01-20 NOTE — ED Notes (Signed)
Attempted to go in and clean the room. Pt asked this nurse to cut off the light and close the door

## 2021-01-20 NOTE — ED Notes (Signed)
Pt given a bath at this time. Linens also changed.

## 2021-01-20 NOTE — Progress Notes (Signed)
Occupational Therapy Treatment Patient Details Name: Brian Silva MRN: [id number] DOB: November 19, 1940 Today's Date: 01/20/2021   History of present illness 80 y.o. male brought to [facility] ED on 01/09/2021 via EMS due to gait dysfunction. Pt signed out of [facility] on 01/09/2021 and was unable to stand upon return home. PMH includes asthma, HLD, HTN, OSA, obesity, PVD.   OT comments  Patient demonstrates fair progress toward patient focused goals.  Patient is able to progress upper body ADL to edge of bed sitting, and was able to increase sit to stand to 3 trials.  Deficits listed below remain.  The patient does not have the needed 24 hour care at home, OT will follow in the acute setting, but SNF is recommended for post acute rehab.     Recommendations for follow up therapy are one component of a multi-disciplinary discharge planning process, led by the attending physician.  Recommendations may be updated based on patient status, additional functional criteria and insurance authorization.    Follow Up Recommendations  SNF    Equipment Recommendations       Recommendations for Other Services      Precautions / Restrictions Precautions Precautions: Fall Restrictions Weight Bearing Restrictions: No       Mobility Bed Mobility Overal bed mobility: Needs Assistance Bed Mobility: Supine to Sit;Sit to Supine     Supine to sit: Min assist;HOB elevated Sit to supine: Mod assist;+2 for safety/equipment;+2 for physical assistance   General bed mobility comments: Required min A for trunk assist to come into sitting. Pt using bed rails to assist. Mod A for LE and trunk assist to return to supine.    Transfers Overall transfer level: Needs assistance Equipment used:  (recliner handles; body habitus too big for [employer]) Transfers: Sit to/from Stand Sit to Stand: Max assist;+2 physical assistance         General transfer comment: Was able to stand X3 this session with max A +2.  Able to stand for brief periods for clean up as linens were soiled. PT/OT blocked BLE throughout.    Balance Overall balance assessment: Needs assistance Sitting-balance support: No upper extremity supported Sitting balance-Leahy Scale: Fair     Standing balance support: Bilateral upper extremity supported Standing balance-Leahy Scale: Poor Standing balance comment: Reliant on UE and external support                           ADL either performed or assessed with clinical judgement   ADL       Grooming: Wash/dry face;Brushing hair;Min guard;Sitting   Upper Body Bathing: Moderate assistance;Sitting   Lower Body Bathing: Total assistance;Bed level   Upper Body Dressing : Minimal assistance;Sitting   Lower Body Dressing: Total assistance;Bed level               Functional mobility during ADLs: Maximal assistance;+2 for physical assistance;+2 for safety/equipment                         Cognition Arousal/Alertness: Awake/alert Behavior During Therapy: WFL for tasks assessed/performed Overall Cognitive Status: Within Functional Limits for tasks assessed                                          Exercises Exercises: General Lower Extremity General Exercises - Lower Extremity Long Arc Quad: AROM;Both;10  reps;Seated Hip Flexion/Marching: AROM;Both;10 reps;Seated (within limited ROM)   Shoulder Instructions       General Comments      Pertinent Vitals/ Pain       Pain Assessment: Faces Faces Pain Scale: Hurts little more Pain Location: low back Pain Descriptors / Indicators: Aching Pain Intervention(s): Monitored during session                                                          Frequency  Min 2X/week        Progress Toward Goals  OT Goals(current goals can now be found in the care plan section)  Progress towards OT goals: Progressing toward goals  Acute Rehab OT Goals Patient Stated  Goal: hopefully go back home OT Goal Formulation: With patient Time For Goal Achievement: 01/31/21 Potential to Achieve Goals: [place] Discharge plan remains appropriate    Co-evaluation    PT/OT/SLP Co-Evaluation/Treatment: Yes Reason for Co-Treatment: Complexity of the patient's impairments (multi-system involvement);For patient/therapist safety PT goals addressed during session: Mobility/safety with mobility;Balance OT goals addressed during session: ADL's and self-care      AM-PAC OT "6 Clicks" Daily Activity     Outcome Measure   Help from another person eating meals?: A Little Help from another person taking care of personal grooming?: A Little Help from another person toileting, which includes using toliet, bedpan, or urinal?: Total Help from another person bathing (including washing, rinsing, drying)?: A Lot Help from another person to put on and taking off regular upper body clothing?: A Lot Help from another person to put on and taking off regular lower body clothing?: Total 6 Click Score: 12    End of Session Equipment Utilized During Treatment: Gait belt;Other (comment)  OT Visit Diagnosis: Unsteadiness on feet (R26.81);Other abnormalities of gait and mobility (R26.89);Muscle weakness (generalized) (M62.81)   Activity Tolerance Patient tolerated treatment well   Patient Left in bed;with call bell/phone within reach   Nurse Communication Mobility status        Time: [id number] OT Time Calculation (min): 24 min  Charges: OT General Charges $OT Visit: 1 Visit OT Treatments $Self Care/Home Management : 8-22 mins  01/20/2021  RP, OTR/L  Acute Rehabilitation Services  Office:  [phone]   Metta Clines 01/20/2021, 12:57 PM

## 2021-01-20 NOTE — Progress Notes (Signed)
Physical Therapy Treatment Patient Details Name: Brian Silva MRN: [id number] DOB: 10/10/1940 Today's Date: 01/20/2021   History of Present Illness 80 y.o. male brought to [facility] ED on 01/09/2021 via EMS due to gait dysfunction. Pt signed out of [facility] on 01/09/2021 and was unable to stand upon return home. PMH includes asthma, HLD, HTN, OSA, obesity, PVD.    PT Comments    Pt progressing well towards goals. Pt able to stand X3 this session with max A +2 and maintain brief periods of standing for clean up. Was also able to participate in seated HEP. Pt remains very motivated to participate with therapy services. Current recommendations for SNF appropriate. Will continue to follow acutely.     Recommendations for follow up therapy are one component of a multi-disciplinary discharge planning process, led by the attending physician.  Recommendations may be updated based on patient status, additional functional criteria and insurance authorization.  Follow Up Recommendations  SNF     Equipment Recommendations  Wheelchair (measurements PT);Wheelchair cushion (measurements PT);Hospital bed;Other (comment) (hoyer lift)    Recommendations for Other Services       Precautions / Restrictions Precautions Precautions: Fall Restrictions Weight Bearing Restrictions: No     Mobility  Bed Mobility Overal bed mobility: Needs Assistance Bed Mobility: Supine to Sit;Sit to Supine     Supine to sit: Min assist;HOB elevated Sit to supine: Mod assist;+2 for safety/equipment;+2 for physical assistance   General bed mobility comments: Required min A for trunk assist to come into sitting. Pt using bed rails to assist. Mod A for LE and trunk assist to return to supine.    Transfers Overall transfer level: Needs assistance Equipment used:  (recliner handles; body habitus too big for [employer]) Transfers: Sit to/from Stand Sit to Stand: Max assist;+2 physical assistance         General  transfer comment: Was able to stand X3 this session with max A +2. Able to stand for brief periods for clean up as linens were soiled. PT/OT blocked BLE throughout.  Ambulation/Gait                 Stairs             Wheelchair Mobility    Modified Rankin (Stroke Patients Only)       Balance Overall balance assessment: Needs assistance Sitting-balance support: No upper extremity supported Sitting balance-Leahy Scale: Fair     Standing balance support: Bilateral upper extremity supported Standing balance-Leahy Scale: Poor Standing balance comment: Reliant on UE and external support                            Cognition Arousal/Alertness: Awake/alert Behavior During Therapy: WFL for tasks assessed/performed Overall Cognitive Status: Within Functional Limits for tasks assessed                                        Exercises General Exercises - Lower Extremity Long Arc Quad: AROM;Both;10 reps;Seated Hip Flexion/Marching: AROM;Both;10 reps;Seated (within limited ROM)    General Comments        Pertinent Vitals/Pain Pain Assessment: Faces Faces Pain Scale: Hurts little more Pain Location: low back Pain Descriptors / Indicators: Aching Pain Intervention(s): Limited activity within patient's tolerance;Monitored during session;Repositioned    Home Living  Prior Function            PT Goals (current goals can now be found in the care plan section) Acute Rehab PT Goals Patient Stated Goal: to regain mobility and eventually return home PT Goal Formulation: With patient Time For Goal Achievement: 01/25/21 Potential to Achieve Goals: Fair Progress towards PT goals: Progressing toward goals    Frequency    Min 2X/week      PT Plan Current plan remains appropriate    Co-evaluation   Reason for Co-Treatment: For patient/therapist safety;To address functional/ADL transfers PT goals addressed  during session: Mobility/safety with mobility;Balance        AM-PAC PT "6 Clicks" Mobility   Outcome Measure  Help needed turning from your back to your side while in a flat bed without using bedrails?: A Little Help needed moving from lying on your back to sitting on the side of a flat bed without using bedrails?: A Little Help needed moving to and from a bed to a chair (including a wheelchair)?: Total Help needed standing up from a chair using your arms (e.g., wheelchair or bedside chair)?: Total Help needed to walk in hospital room?: Total Help needed climbing 3-5 steps with a railing? : Total 6 Click Score: 10    End of Session Equipment Utilized During Treatment: Gait belt Activity Tolerance: Patient tolerated treatment well Patient left: in bed;with call bell/phone within reach;with nursing/sitter in room Nurse Communication: Mobility status PT Visit Diagnosis: Other abnormalities of gait and mobility (R26.89);Muscle weakness (generalized) (M62.81);Difficulty in walking, not elsewhere classified (R26.2)     Time: [id number] PT Time Calculation (min) (ACUTE ONLY): 26 min  Charges:  $Therapeutic Activity: 8-22 mins                     Lou Miner, DPT  Acute Rehabilitation Services  Pager: [phone] Office: [phone]    Rudean Hitt 01/20/2021, 11:54 AM

## 2021-01-21 DIAGNOSIS — R269 Unspecified abnormalities of gait and mobility: Secondary | ICD-10-CM | POA: Diagnosis not present

## 2021-01-21 NOTE — ED Notes (Signed)
Woke pt up to take PM meds, pt has been sleeping most of the day. Pt opened eyes and started talking to this nurse. Per pt he didn't sleep well last night. Pt ao x 4, asked for soda to take meds with.

## 2021-01-21 NOTE — ED Notes (Signed)
Pt ao x 4, NAD. Denies any symptoms at this time. Refused breakfast tray this morning.

## 2021-01-21 NOTE — ED Notes (Signed)
Breakfast Ordered 

## 2021-01-22 DIAGNOSIS — R269 Unspecified abnormalities of gait and mobility: Secondary | ICD-10-CM | POA: Diagnosis not present

## 2021-01-22 NOTE — ED Notes (Signed)
Placed Breakfast order 

## 2021-01-23 DIAGNOSIS — R269 Unspecified abnormalities of gait and mobility: Secondary | ICD-10-CM | POA: Diagnosis not present

## 2021-01-23 NOTE — Progress Notes (Signed)
CSW contacted [facility] to see if they could provide proof of patient being vaccinated.

## 2021-01-23 NOTE — ED Notes (Signed)
Pt currently sleeping. Offered breakfast but pt states he wants to sleep more at this time. Will continue to monitor. Breakfast tray left at bedside.

## 2021-01-23 NOTE — ED Notes (Signed)
Currently sleeping. No acute distress.

## 2021-01-23 NOTE — ED Notes (Signed)
Pt ate breakfast. Refused lunch. Currently laying on bed. Refused to be turned at this time. Pt is alert and oriented x 4. Pleasant with staff. Took his scheduled meds with no complaints. Pt denies any pain at this time. Will continue to monitor.

## 2021-01-23 NOTE — Progress Notes (Signed)
Ebony Hail the administrator with [facility] and [facility] is willing to accept patient based on insurance authorization and vaccination status. CSW spoke with the front desk at [facility] who plan to fax CSW a copy of patients vaccination card. Ebony Hail also is starting the insurance authorization for [employer] today.

## 2021-01-24 DIAGNOSIS — R269 Unspecified abnormalities of gait and mobility: Secondary | ICD-10-CM | POA: Diagnosis not present

## 2021-01-24 NOTE — ED Notes (Signed)
Physical therapist came by and states she will see pt later today.

## 2021-01-24 NOTE — ED Provider Notes (Signed)
Emergency Medicine Observation Re-evaluation Note  Brian Silva is a 80 y.o. male, seen on rounds today.  Pt initially presented to the ED for complaints of No chief complaint on file. Currently, the patient is resting.  Physical Exam  BP (!) 144/59 (BP Location: Right Arm)   Pulse 66   Temp 98.6 F (37 C) (Oral)   Resp 15   Wt (!) 158.8 kg   SpO2 99%   BMI 50.22 kg/m  Physical Exam General: resting comfortably, NAD Lungs: normal WOB Psych: currently calm and resting  ED Course / MDM  EKG:   I have reviewed the labs performed to date as well as medications administered while in observation.  Recent changes in the last 24 hours include none.  Plan  Current plan is for social work to aid in placement, pending approval.  Brian Silva is not under involuntary commitment.     Lorelle Gibbs, [place] 01/24/21 [id number]

## 2021-01-24 NOTE — Progress Notes (Signed)
Patient completed phone interview with [facility] business office. Angie in the business office wanted to make sure patient would sign over his SSI money to the facility and she also wanted to know from patient why he left [facility] AMA. Angie stated someone might come out to the hospital today and have him sign paperwork. Angie stated she will need to finish some paperwork in her end.

## 2021-01-24 NOTE — Progress Notes (Signed)
CSW contacted Brian Silva in [place] to obtain vaccination proof. CSW was told by Ginger that patient had his last booster in April but she does not have access to his files to send to [place]. Ginger stated she will give the lady who has access CSW's number to obtain vaccination proof.

## 2021-01-24 NOTE — Progress Notes (Signed)
Physical Therapy Treatment Patient Details Name: Brian Silva MRN: [id number] DOB: 1940/06/03 Today's Date: 01/24/2021   History of Present Illness 80 y.o. male brought to [facility] ED on 01/09/2021 via EMS due to gait dysfunction. Pt signed out of [facility] on 01/09/2021 and was unable to stand upon return home. PMH includes asthma, HLD, HTN, OSA, obesity, PVD.    PT Comments    Pt was seen for mobility on RW but cannot fully stand. However he is aware of the sequence that needs to take place for progression to stand.  Recommend he is seen with two person help and consider a stedy if a bariatric one is available, and if not will need bari rollator or RW with two person cued help.  Pt is interested in improving and continue to recommend him to SNF for follow up.   Recommendations for follow up therapy are one component of a multi-disciplinary discharge planning process, led by the attending physician.  Recommendations may be updated based on patient status, additional functional criteria and insurance authorization.  Follow Up Recommendations  SNF     Equipment Recommendations  Wheelchair (measurements PT);Wheelchair cushion (measurements PT);Hospital bed;Other (comment)    Recommendations for Other Services       Precautions / Restrictions Precautions Precautions: Fall Restrictions Weight Bearing Restrictions: No     Mobility  Bed Mobility Overal bed mobility: Needs Assistance Bed Mobility: Supine to Sit;Sit to Supine     Supine to sit: Min assist Sit to supine: Min assist;Mod assist   General bed mobility comments: mod on return to assist legs to bed    Transfers Overall transfer level: Needs assistance Equipment used:  (used locked recliner) Transfers: Sit to/from Stand Sit to Stand: Max assist;From elevated surface         General transfer comment: partially stood with just PT  Ambulation/Gait             General Gait Details: unable to attempt  today   Stairs             Wheelchair Mobility    Modified Rankin (Stroke Patients Only)       Balance Overall balance assessment: Needs assistance Sitting-balance support: Feet supported Sitting balance-Leahy Scale: Fair     Standing balance support: Bilateral upper extremity supported;During functional activity Standing balance-Leahy Scale: Poor                              Cognition Arousal/Alertness: Awake/alert Behavior During Therapy: WFL for tasks assessed/performed Overall Cognitive Status: Within Functional Limits for tasks assessed                                        Exercises General Exercises - Lower Extremity Ankle Circles/Pumps: AAROM;AROM;5 reps Long Arc Quad: AROM;AAROM;10 reps Heel Slides: AROM;AAROM;10 reps Hip ABduction/ADduction: AROM;AAROM;10 reps Hip Flexion/Marching: AAROM;10 reps    General Comments General comments (skin integrity, edema, etc.): pt is able to do ROM to legs but cannot organize the effort to do a full stand.  Has the ability to do this with help but gives a weak effort to power up      Pertinent Vitals/Pain Pain Assessment: Faces Faces Pain Scale: Hurts little more Pain Location: low back Pain Descriptors / Indicators: Grimacing;Guarding Pain Intervention(s): Limited activity within patient's tolerance;Monitored during session;Repositioned;Premedicated before session  Home Living                      Prior Function            PT Goals (current goals can now be found in the care plan section) Acute Rehab PT Goals Patient Stated Goal: hopefully go back home    Frequency    Min 2X/week      PT Plan Current plan remains appropriate    Co-evaluation              AM-PAC PT "6 Clicks" Mobility   Outcome Measure  Help needed turning from your back to your side while in a flat bed without using bedrails?: A Little Help needed moving from lying on your back to  sitting on the side of a flat bed without using bedrails?: A Little Help needed moving to and from a bed to a chair (including a wheelchair)?: A Lot Help needed standing up from a chair using your arms (e.g., wheelchair or bedside chair)?: Total Help needed to walk in hospital room?: Total Help needed climbing 3-5 steps with a railing? : Total 6 Click Score: 11    End of Session Equipment Utilized During Treatment: Gait belt Activity Tolerance: Patient tolerated treatment well;Patient limited by fatigue Patient left: in bed;with call bell/phone within reach;with bed alarm set Nurse Communication: Mobility status PT Visit Diagnosis: Other abnormalities of gait and mobility (R26.89);Muscle weakness (generalized) (M62.81);Difficulty in walking, not elsewhere classified (R26.2)     Time: [id number] PT Time Calculation (min) (ACUTE ONLY): 47 min  Charges:  $Therapeutic Exercise: 8-22 mins $Therapeutic Activity: 23-37 mins                  Ramond Dial 01/24/2021, 6:57 PM  Mee Hives, PT PhD Acute Rehab Dept. Number: [place] [facility] [place]

## 2021-01-24 NOTE — Progress Notes (Signed)
CSW contacted the executive director again at [facility] today requesting patients vaccination proof that is needed for placement. CSW was told by director, Nicole Kindred that he would fax it and then call CSW in 20 minutes to see if she received it.

## 2021-01-24 NOTE — ED Notes (Signed)
Patient refused breakfast 

## 2021-01-24 NOTE — ED Notes (Signed)
Pt requesting to speak to social worker on day shift - will make oncoming RN aware and send message to social worker

## 2021-01-24 NOTE — Progress Notes (Signed)
CSW also spoke with the administrator of [facility] to pass on the message to have the director fax the vaccination proof ASAP!

## 2021-01-24 NOTE — Progress Notes (Signed)
CSW received a message back from the executive director at [facility] who stated he does not have the vaccination proof and had to request the information from their pharmacy at their facility. CSW was told when he receives it he would fax to [place].

## 2021-01-24 NOTE — ED Notes (Signed)
Spoke to SW at this time who states pending insurance approval for SNF at this time.

## 2021-01-24 NOTE — ED Notes (Signed)
Refused breakfast at this time. Pt went back to sleep. No acute distress. VS taken. Will continue to monitor.

## 2021-01-24 NOTE — Progress Notes (Signed)
CSW checked the fax for the vaccination proof around 9:30 AM. Nothing was sent. CSW send another message to the director and got no response. CSW called the main office line and stated the information is needed for placement and patient continues to sit in the ED. CSW was told another message would be sent to the director.

## 2021-01-24 NOTE — ED Notes (Signed)
PT called at this time. Per physical therapist, pt was seen on Friday and pt is on the list to be seen today. Pt was incontinent of stool. Checked and changed by staff at this time. Pt was able to help roll himself to get changed. 2 people assist needed to change pt. Will continue to monitor.

## 2021-01-24 NOTE — Progress Notes (Signed)
CSW spoke with the administrator from [facility] to follow-up on insurance authorization. There appears to be a delay and the business office would like to speak with patient directly. CSW stated when they are ready to speak with patient she can provide him a phone. CSW was not given details on what the issue may be.

## 2021-01-25 DIAGNOSIS — R269 Unspecified abnormalities of gait and mobility: Secondary | ICD-10-CM | POA: Diagnosis not present

## 2021-01-25 NOTE — Progress Notes (Signed)
Occupational Therapy Treatment Patient Details Name: Brian Silva MRN: [id number] DOB: [birth year] Today's Date: 01/25/2021   History of present illness 80 y.o. male brought to [facility] ED on 01/09/2021 via EMS due to gait dysfunction. Pt signed out of [facility] on 01/09/2021 and was unable to stand upon return home. PMH includes asthma, HLD, HTN, OSA, obesity, PVD.   OT comments  Patient initially expressing interest in upper body exercise, but after effort to sit edge of bed and two trials of chir push ups from side of bed, patient complaining of fatigue and inability to perform upper body exercises.  Patient describing what he does for exercises, stating he does 100 reps.  OT discussed form over reps when using theraband.  Patient returned to supine at his request, OT to continue to follow in the acute setting, but SNF for post acute rehab is needed before he can return home.  Patient is unable to care for himself without extensive assist, which he does not currently have.     Recommendations for follow up therapy are one component of a multi-disciplinary discharge planning process, led by the attending physician.  Recommendations may be updated based on patient status, additional functional criteria and insurance authorization.    Follow Up Recommendations  SNF    Equipment Recommendations  None recommended by OT    Recommendations for Other Services      Precautions / Restrictions Precautions Precautions: Fall Restrictions Weight Bearing Restrictions: No Other Position/Activity Restrictions: body habitus       Mobility Bed Mobility Overal bed mobility: Needs Assistance Bed Mobility: Supine to Sit;Sit to Supine     Supine to sit: Min assist;HOB elevated Sit to supine: Min assist;Mod assist   General bed mobility comments: increased time and effort, heavy reliance on SR's    Transfers                      Balance Overall balance assessment: Needs  assistance Sitting-balance support: Feet supported Sitting balance-Leahy Scale: Fair                                                          Cognition Arousal/Alertness: Awake/alert Behavior During Therapy: WFL for tasks assessed/performed Overall Cognitive Status: Within Functional Limits for tasks assessed                                          Exercises Other Exercises Other Exercises: chair push ups from EOB: 2 trials and 3 reps each attempt.  Patient is unable to clear hips/bottom off the bed.                Pertinent Vitals/ Pain       Pain Assessment: Faces Faces Pain Scale: Hurts a little bit Pain Location: low back Pain Descriptors / Indicators: Aching Pain Intervention(s): Monitored during session                                                          Frequency  Min  2X/week        Progress Toward Goals  OT Goals(current goals can now be found in the care plan section)  Progress towards OT goals: Progressing toward goals  Acute Rehab OT Goals Patient Stated Goal: go to rehab again OT Goal Formulation: With patient Time For Goal Achievement: 01/31/21 Potential to Achieve Goals: [place] Discharge plan remains appropriate    Co-evaluation                 AM-PAC OT "6 Clicks" Daily Activity     Outcome Measure   Help from another person eating meals?: None Help from another person taking care of personal grooming?: None Help from another person toileting, which includes using toliet, bedpan, or urinal?: A Lot Help from another person bathing (including washing, rinsing, drying)?: A Lot Help from another person to put on and taking off regular upper body clothing?: A Lot Help from another person to put on and taking off regular lower body clothing?: Total 6 Click Score: 15    End of Session    OT Visit Diagnosis: Unsteadiness on feet (R26.81);Other abnormalities of  gait and mobility (R26.89);Muscle weakness (generalized) (M62.81)   Activity Tolerance Patient limited by fatigue   Patient Left in bed;with call bell/phone within reach   Nurse Communication          Time: [id number] OT Time Calculation (min): 14 min  Charges: OT General Charges $OT Visit: 1 Visit OT Treatments $Therapeutic Activity: 8-22 mins  01/25/2021  RP, OTR/L  Acute Rehabilitation Services  Office:  [phone]   Metta Clines 01/25/2021, 4:18 PM

## 2021-01-25 NOTE — Progress Notes (Signed)
CSW spoke with the administrator at [facility] who stated that the business manager did not give her the paperwork yesterday to have patient sign over his SSI check. Ebony Hail stated she would follow-up with them this morning and let CSW know.

## 2021-01-25 NOTE — ED Notes (Signed)
Placed Breakfast orders 

## 2021-01-25 NOTE — ED Notes (Signed)
Lunch ordered 

## 2021-01-25 NOTE — ED Provider Notes (Signed)
  Physical Exam  BP (!) 143/60 (BP Location: Left Arm)   Pulse 69   Temp 97.8 F (36.6 C) (Oral)   Resp 15   Wt (!) 158.8 kg   SpO2 99%   BMI 50.22 kg/m   Physical Exam  ED Course/Procedures   Clinical Course as of 01/25/21 0818  Tue Jan 10, 2021  1612 Patient signed out to Dr Glynda Jaeger EDP pending SW follow up regarding placement options. [MT]    Clinical Course User Index [MT] Trifan, Carola Rhine, MD    Procedures  MDM  Patient still pending placement by social work. SNF recommended by PT       Davonna Belling, MD 01/25/21 [phone]

## 2021-01-25 NOTE — ED Notes (Signed)
Last Blank Note charted in Error.

## 2021-01-25 NOTE — ED Notes (Signed)
Pt becoming more agitated and is attempting to get out of bed. EDP notified.

## 2021-01-25 NOTE — ED Notes (Signed)
Pt resting. Even Resp. No distress noted at this time.

## 2021-01-25 NOTE — Progress Notes (Signed)
CSW never heard back from Development worker, international aid form [facility] about vaccination proof. CSW contacted the facilities pharmacy to see if they would release that information to CSW. CSW received an e-mail with the dates patient was vaccinated from the [place]. CSW will provide a copy to [facility] to see if the documents are sufficient.

## 2021-01-26 DIAGNOSIS — R269 Unspecified abnormalities of gait and mobility: Secondary | ICD-10-CM | POA: Diagnosis not present

## 2021-01-26 NOTE — Progress Notes (Signed)
Patient completed admission paperwork with [facility]. CSW is waiting to hear back about a discharge date.

## 2021-01-26 NOTE — ED Notes (Signed)
Pt resting, Even Resp. No distress noted at this time.

## 2021-01-26 NOTE — ED Notes (Signed)
Pt stated he would like to wait until all his medication arrive before taking anything. Pharmacy was notified about missing Lyrica dose. Emptied pt urinal

## 2021-01-26 NOTE — ED Notes (Signed)
Provided pt with diet cola. Pt requested to leave door open d/t being cold.

## 2021-01-26 NOTE — ED Notes (Signed)
Meal provided at the bedside

## 2021-01-26 NOTE — Progress Notes (Signed)
CSW spoke with patients nurse who stated that patient wants to know what is going on. CSW informed patients nurse that she has told patient this week that its a process to get him into a new SNF because he left AMA from [facility] and owes 10,000 dollars. Facilities feel patient is a liability and want to make sure he will follow through with therapy and sign over his SSI check. CSW informed patients nurse that someone will be coming this morning to have patient sign paperwork.

## 2021-01-26 NOTE — Progress Notes (Signed)
CSW received a message from [facility] and they will be coming this morning to have patient sign paperwork for admission. CSW was told by Ebony Hail the administrator that she would let CSW know when she is on the way.

## 2021-01-26 NOTE — ED Notes (Signed)
Pt c/o constipation and nausea. Pt given medication for constipation and nausea.

## 2021-01-27 DIAGNOSIS — R269 Unspecified abnormalities of gait and mobility: Secondary | ICD-10-CM | POA: Diagnosis not present

## 2021-01-27 NOTE — ED Notes (Signed)
Breakfast Ordered 

## 2021-01-27 NOTE — Progress Notes (Signed)
CSW is still waiting approval from patients insurance for placement. Vaccination proof was sent to [facility].

## 2021-01-27 NOTE — Progress Notes (Signed)
Patients [facility] Medicare plan does not cover skilled nursing. [facility] plans to take patient under his medicaid plan. Patient has to pay a liability of $853 for the month of October. Patient does not have his bank card to pay [facility] via phone. CSW reached out to patients bank and they stated patient could wire the money as long as he has his banking account information. CSW is waiting for the account information from [facility]

## 2021-01-27 NOTE — Progress Notes (Signed)
PT Cancellation Note  Patient Details Name: Brian Silva MRN: [id number] DOB: 28-Nov-1940   Cancelled Treatment:    Reason Eval/Treat Not Completed: Fatigue/lethargy limiting ability to participate Pt sleeping and when awoken, requesting to go back to sleep. Will follow up as schedule allows.   Reuel Derby, PT, DPT  Acute Rehabilitation Services  Pager: [phone] Office: [phone]    Rudean Hitt 01/27/2021, 11:02 AM

## 2021-01-27 NOTE — ED Provider Notes (Signed)
Emergency Medicine Observation Re-evaluation Note  TMOTHY PAPALEO is a 80 y.o. male, seen on rounds today.  Pt initially presented to the ED for complaints of No chief complaint on file. Currently, the patient is resting comfortably.  Physical Exam  BP (!) 118/55 (BP Location: Left Wrist)   Pulse 66   Temp 97.7 F (36.5 C) (Oral)   Resp 19   Ht '5\' 10"'$  (1.778 m)   Wt (!) 161 kg   SpO2 93%   BMI 50.93 kg/m  Physical Exam General: NAD Cardiac: well perfused Lungs: respirations even and unlabored Psych: No agitation  ED Course / MDM  EKG:   I have reviewed the labs performed to date as well as medications administered while in observation.  Recent changes in the last 24 hours include none.  Plan  Current plan is for social work awaiting placement.  Social work note 10/6: Patient completed admission paperwork with [facility]. CSW is waiting to hear back about a discharge date.    Bethanie Dicker Zacharia is not under involuntary commitment.     Regan Lemming, MD 01/27/21 1015

## 2021-01-27 NOTE — Discharge Instructions (Addendum)
Return for any problem.  ?

## 2021-01-27 NOTE — ED Notes (Signed)
Pt has no requests at this time

## 2021-01-27 NOTE — ED Notes (Signed)
Pt in bed, pt denies pain, resps even and unlabored.  Pt has no requests at this time.

## 2021-01-27 NOTE — ED Notes (Signed)
Pt in bed, pt has eaten his meal tray, pt offers no complaints.

## 2021-01-27 NOTE — Progress Notes (Signed)
October payment was completed and approved. Patient will be going to room 209-1 at [facility]. Covid test is needed before discharge. Patient will need to be discharged tomorrow morning by [facility]AR.

## 2021-01-28 DIAGNOSIS — R269 Unspecified abnormalities of gait and mobility: Secondary | ICD-10-CM | POA: Diagnosis not present

## 2021-01-28 NOTE — ED Notes (Signed)
Attempted to call report to [facility], staff states they weren't aware of patient coming.

## 2021-01-28 NOTE — TOC Transition Note (Signed)
Transition of Care [facility]) - CM/SW Discharge Note   Patient Details  Name: Brian Silva MRN: [id number] Date of Birth: [birth year]  [facility] CM/SW Contact:  Alfredia Ferguson, LCSW Phone Number: 01/28/2021, 8:32 AM   Clinical Narrative:    CSW informed by RN facility staff told her during report they were not expecting any patients. CSW called Rachel Moulds with admissions and noted staff should be aware but will inform them that patient is arriving today via [place]. CSW received callback from [facility] rehab staff are ready for report.    Final next level of care: Long Term Nursing Home Barriers to Discharge: Barriers Resolved   Patient Goals and CMS Choice Patient states their goals for this hospitalization and ongoing recovery are:: to walk again CMS Medicare.gov Compare Post Acute Care list provided to:: Patient Choice offered to / list presented to : Patient  Discharge Placement                       Discharge Plan and Services                                     Social Determinants of Health (SDOH) Interventions     Readmission Risk Interventions No flowsheet data found.

## 2021-03-23 DIAGNOSIS — M6281 Muscle weakness (generalized): Secondary | ICD-10-CM | POA: Diagnosis not present

## 2021-03-23 DIAGNOSIS — R262 Difficulty in walking, not elsewhere classified: Secondary | ICD-10-CM | POA: Diagnosis not present

## 2021-03-24 DIAGNOSIS — M6281 Muscle weakness (generalized): Secondary | ICD-10-CM | POA: Diagnosis not present

## 2021-03-24 DIAGNOSIS — R262 Difficulty in walking, not elsewhere classified: Secondary | ICD-10-CM | POA: Diagnosis not present

## 2021-03-28 DIAGNOSIS — R262 Difficulty in walking, not elsewhere classified: Secondary | ICD-10-CM | POA: Diagnosis not present

## 2021-03-28 DIAGNOSIS — M6281 Muscle weakness (generalized): Secondary | ICD-10-CM | POA: Diagnosis not present

## 2021-03-30 DIAGNOSIS — R262 Difficulty in walking, not elsewhere classified: Secondary | ICD-10-CM | POA: Diagnosis not present

## 2021-03-30 DIAGNOSIS — M6281 Muscle weakness (generalized): Secondary | ICD-10-CM | POA: Diagnosis not present

## 2021-04-01 DIAGNOSIS — E119 Type 2 diabetes mellitus without complications: Secondary | ICD-10-CM | POA: Diagnosis not present

## 2021-04-01 DIAGNOSIS — G8929 Other chronic pain: Secondary | ICD-10-CM | POA: Diagnosis not present

## 2021-04-12 DIAGNOSIS — E1159 Type 2 diabetes mellitus with other circulatory complications: Secondary | ICD-10-CM | POA: Diagnosis not present

## 2021-04-12 DIAGNOSIS — B351 Tinea unguium: Secondary | ICD-10-CM | POA: Diagnosis not present

## 2021-04-14 DIAGNOSIS — H524 Presbyopia: Secondary | ICD-10-CM | POA: Diagnosis not present

## 2021-05-08 DIAGNOSIS — R600 Localized edema: Secondary | ICD-10-CM | POA: Diagnosis not present

## 2021-05-08 DIAGNOSIS — K0889 Other specified disorders of teeth and supporting structures: Secondary | ICD-10-CM | POA: Diagnosis not present

## 2021-05-18 DIAGNOSIS — K047 Periapical abscess without sinus: Secondary | ICD-10-CM | POA: Diagnosis not present

## 2021-05-19 DIAGNOSIS — E119 Type 2 diabetes mellitus without complications: Secondary | ICD-10-CM | POA: Diagnosis not present

## 2021-05-23 DIAGNOSIS — E785 Hyperlipidemia, unspecified: Secondary | ICD-10-CM | POA: Diagnosis not present

## 2021-05-23 DIAGNOSIS — K5900 Constipation, unspecified: Secondary | ICD-10-CM | POA: Diagnosis not present

## 2021-05-23 DIAGNOSIS — I10 Essential (primary) hypertension: Secondary | ICD-10-CM | POA: Diagnosis not present

## 2021-05-25 DIAGNOSIS — E785 Hyperlipidemia, unspecified: Secondary | ICD-10-CM | POA: Diagnosis not present

## 2021-05-25 DIAGNOSIS — I10 Essential (primary) hypertension: Secondary | ICD-10-CM | POA: Diagnosis not present

## 2021-05-25 DIAGNOSIS — E119 Type 2 diabetes mellitus without complications: Secondary | ICD-10-CM | POA: Diagnosis not present

## 2021-05-25 DIAGNOSIS — J45909 Unspecified asthma, uncomplicated: Secondary | ICD-10-CM | POA: Diagnosis not present

## 2021-06-26 DIAGNOSIS — N189 Chronic kidney disease, unspecified: Secondary | ICD-10-CM | POA: Diagnosis not present

## 2021-06-26 DIAGNOSIS — I10 Essential (primary) hypertension: Secondary | ICD-10-CM | POA: Diagnosis not present

## 2021-06-27 DIAGNOSIS — R262 Difficulty in walking, not elsewhere classified: Secondary | ICD-10-CM | POA: Diagnosis not present

## 2021-06-27 DIAGNOSIS — M6281 Muscle weakness (generalized): Secondary | ICD-10-CM | POA: Diagnosis not present

## 2021-06-28 DIAGNOSIS — M6281 Muscle weakness (generalized): Secondary | ICD-10-CM | POA: Diagnosis not present

## 2021-06-28 DIAGNOSIS — R262 Difficulty in walking, not elsewhere classified: Secondary | ICD-10-CM | POA: Diagnosis not present

## 2021-06-29 DIAGNOSIS — R52 Pain, unspecified: Secondary | ICD-10-CM | POA: Diagnosis not present

## 2021-06-29 DIAGNOSIS — M6281 Muscle weakness (generalized): Secondary | ICD-10-CM | POA: Diagnosis not present

## 2021-06-29 DIAGNOSIS — R262 Difficulty in walking, not elsewhere classified: Secondary | ICD-10-CM | POA: Diagnosis not present

## 2021-06-29 DIAGNOSIS — Z760 Encounter for issue of repeat prescription: Secondary | ICD-10-CM | POA: Diagnosis not present

## 2021-06-30 DIAGNOSIS — R262 Difficulty in walking, not elsewhere classified: Secondary | ICD-10-CM | POA: Diagnosis not present

## 2021-06-30 DIAGNOSIS — M6281 Muscle weakness (generalized): Secondary | ICD-10-CM | POA: Diagnosis not present

## 2021-07-04 DIAGNOSIS — R262 Difficulty in walking, not elsewhere classified: Secondary | ICD-10-CM | POA: Diagnosis not present

## 2021-07-04 DIAGNOSIS — M6281 Muscle weakness (generalized): Secondary | ICD-10-CM | POA: Diagnosis not present

## 2021-07-05 DIAGNOSIS — M6281 Muscle weakness (generalized): Secondary | ICD-10-CM | POA: Diagnosis not present

## 2021-07-05 DIAGNOSIS — R262 Difficulty in walking, not elsewhere classified: Secondary | ICD-10-CM | POA: Diagnosis not present

## 2021-07-06 DIAGNOSIS — M6281 Muscle weakness (generalized): Secondary | ICD-10-CM | POA: Diagnosis not present

## 2021-07-06 DIAGNOSIS — R262 Difficulty in walking, not elsewhere classified: Secondary | ICD-10-CM | POA: Diagnosis not present

## 2021-07-07 DIAGNOSIS — R262 Difficulty in walking, not elsewhere classified: Secondary | ICD-10-CM | POA: Diagnosis not present

## 2021-07-07 DIAGNOSIS — M6281 Muscle weakness (generalized): Secondary | ICD-10-CM | POA: Diagnosis not present

## 2021-07-10 DIAGNOSIS — R262 Difficulty in walking, not elsewhere classified: Secondary | ICD-10-CM | POA: Diagnosis not present

## 2021-07-10 DIAGNOSIS — M6281 Muscle weakness (generalized): Secondary | ICD-10-CM | POA: Diagnosis not present

## 2021-07-11 DIAGNOSIS — E1159 Type 2 diabetes mellitus with other circulatory complications: Secondary | ICD-10-CM | POA: Diagnosis not present

## 2021-07-11 DIAGNOSIS — B351 Tinea unguium: Secondary | ICD-10-CM | POA: Diagnosis not present

## 2021-07-11 DIAGNOSIS — M6281 Muscle weakness (generalized): Secondary | ICD-10-CM | POA: Diagnosis not present

## 2021-07-11 DIAGNOSIS — R262 Difficulty in walking, not elsewhere classified: Secondary | ICD-10-CM | POA: Diagnosis not present

## 2021-07-12 DIAGNOSIS — M6281 Muscle weakness (generalized): Secondary | ICD-10-CM | POA: Diagnosis not present

## 2021-07-12 DIAGNOSIS — R262 Difficulty in walking, not elsewhere classified: Secondary | ICD-10-CM | POA: Diagnosis not present

## 2021-07-13 DIAGNOSIS — R262 Difficulty in walking, not elsewhere classified: Secondary | ICD-10-CM | POA: Diagnosis not present

## 2021-07-13 DIAGNOSIS — M6281 Muscle weakness (generalized): Secondary | ICD-10-CM | POA: Diagnosis not present

## 2021-07-14 DIAGNOSIS — R262 Difficulty in walking, not elsewhere classified: Secondary | ICD-10-CM | POA: Diagnosis not present

## 2021-07-14 DIAGNOSIS — M6281 Muscle weakness (generalized): Secondary | ICD-10-CM | POA: Diagnosis not present

## 2021-07-18 DIAGNOSIS — M6281 Muscle weakness (generalized): Secondary | ICD-10-CM | POA: Diagnosis not present

## 2021-07-18 DIAGNOSIS — R262 Difficulty in walking, not elsewhere classified: Secondary | ICD-10-CM | POA: Diagnosis not present

## 2021-07-19 DIAGNOSIS — R262 Difficulty in walking, not elsewhere classified: Secondary | ICD-10-CM | POA: Diagnosis not present

## 2021-07-19 DIAGNOSIS — M6281 Muscle weakness (generalized): Secondary | ICD-10-CM | POA: Diagnosis not present

## 2021-07-24 ENCOUNTER — Other Ambulatory Visit: Payer: Self-pay

## 2021-07-24 ENCOUNTER — Emergency Department (HOSPITAL_COMMUNITY): Payer: Medicare Other

## 2021-07-24 ENCOUNTER — Emergency Department (HOSPITAL_COMMUNITY)
Admission: EM | Admit: 2021-07-24 | Discharge: 2021-07-25 | Disposition: A | Discharge status: Skilled Nursing Facility | Payer: Medicare Other | Attending: Emergency Medicine | Admitting: Emergency Medicine

## 2021-07-24 ENCOUNTER — Encounter (HOSPITAL_COMMUNITY): Payer: Self-pay

## 2021-07-24 DIAGNOSIS — Z79899 Other long term (current) drug therapy: Secondary | ICD-10-CM | POA: Diagnosis not present

## 2021-07-24 DIAGNOSIS — I10 Essential (primary) hypertension: Secondary | ICD-10-CM | POA: Diagnosis not present

## 2021-07-24 DIAGNOSIS — M2578 Osteophyte, vertebrae: Secondary | ICD-10-CM | POA: Diagnosis not present

## 2021-07-24 DIAGNOSIS — Z7982 Long term (current) use of aspirin: Secondary | ICD-10-CM | POA: Insufficient documentation

## 2021-07-24 DIAGNOSIS — M5412 Radiculopathy, cervical region: Secondary | ICD-10-CM | POA: Diagnosis not present

## 2021-07-24 DIAGNOSIS — J45909 Unspecified asthma, uncomplicated: Secondary | ICD-10-CM | POA: Diagnosis not present

## 2021-07-24 DIAGNOSIS — M79642 Pain in left hand: Secondary | ICD-10-CM | POA: Diagnosis not present

## 2021-07-24 DIAGNOSIS — R7989 Other specified abnormal findings of blood chemistry: Secondary | ICD-10-CM | POA: Diagnosis not present

## 2021-07-24 DIAGNOSIS — M47812 Spondylosis without myelopathy or radiculopathy, cervical region: Secondary | ICD-10-CM | POA: Diagnosis not present

## 2021-07-24 DIAGNOSIS — R202 Paresthesia of skin: Secondary | ICD-10-CM | POA: Diagnosis not present

## 2021-07-24 DIAGNOSIS — Z743 Need for continuous supervision: Secondary | ICD-10-CM | POA: Diagnosis not present

## 2021-07-24 DIAGNOSIS — M5410 Radiculopathy, site unspecified: Secondary | ICD-10-CM

## 2021-07-24 DIAGNOSIS — R200 Anesthesia of skin: Secondary | ICD-10-CM | POA: Diagnosis not present

## 2021-07-24 DIAGNOSIS — D649 Anemia, unspecified: Secondary | ICD-10-CM | POA: Insufficient documentation

## 2021-07-24 LAB — URINALYSIS, ROUTINE W REFLEX MICROSCOPIC
Bilirubin Urine: NEGATIVE
Glucose, UA: NEGATIVE mg/dL
Hgb urine dipstick: NEGATIVE
Ketones, ur: NEGATIVE mg/dL
Leukocytes,Ua: NEGATIVE
Nitrite: NEGATIVE
Protein, ur: NEGATIVE mg/dL
Specific Gravity, Urine: 1.013 (ref 1.005–1.030)
pH: 6 (ref 5.0–8.0)

## 2021-07-24 LAB — COMPREHENSIVE METABOLIC PANEL
ALT: 15 U/L (ref 0–44)
AST: 19 U/L (ref 15–41)
Albumin: 3.2 g/dL — ABNORMAL LOW (ref 3.5–5.0)
Alkaline Phosphatase: 109 U/L (ref 38–126)
Anion gap: 9 (ref 5–15)
BUN: 25 mg/dL — ABNORMAL HIGH (ref 8–23)
CO2: 27 mmol/L (ref 22–32)
Calcium: 8.9 mg/dL (ref 8.9–10.3)
Chloride: 106 mmol/L (ref 98–111)
Creatinine, Ser: 1.91 mg/dL — ABNORMAL HIGH (ref 0.61–1.24)
GFR, Estimated: 35 mL/min — ABNORMAL LOW (ref >60)
Glucose, Bld: 174 mg/dL — ABNORMAL HIGH (ref 70–99)
Potassium: 4.1 mmol/L (ref 3.5–5.1)
Sodium: 142 mmol/L (ref 135–145)
Total Bilirubin: 0.6 mg/dL (ref 0.3–1.2)
Total Protein: 6.9 g/dL (ref 6.5–8.1)

## 2021-07-24 LAB — CBC WITH DIFFERENTIAL/PLATELET
Abs Immature Granulocytes: 0.04 10*3/uL (ref 0.00–0.07)
Basophils Absolute: 0.1 10*3/uL (ref 0.0–0.1)
Basophils Relative: 1 %
Eosinophils Absolute: 0.1 10*3/uL (ref 0.0–0.5)
Eosinophils Relative: 2 %
HCT: 34.4 % — ABNORMAL LOW (ref 39.0–52.0)
Hemoglobin: 10.9 g/dL — ABNORMAL LOW (ref 13.0–17.0)
Immature Granulocytes: 1 %
Lymphocytes Relative: 13 %
Lymphs Abs: 1.1 10*3/uL (ref 0.7–4.0)
MCH: 28.6 pg (ref 26.0–34.0)
MCHC: 31.7 g/dL (ref 30.0–36.0)
MCV: 90.3 fL (ref 80.0–100.0)
Monocytes Absolute: 0.5 10*3/uL (ref 0.1–1.0)
Monocytes Relative: 6 %
Neutro Abs: 6.1 10*3/uL (ref 1.7–7.7)
Neutrophils Relative %: 77 %
Platelets: 381 10*3/uL (ref 150–400)
RBC: 3.81 MIL/uL — ABNORMAL LOW (ref 4.22–5.81)
RDW: 13.9 % (ref 11.5–15.5)
WBC: 7.9 10*3/uL (ref 4.0–10.5)
nRBC: 0 % (ref 0.0–0.2)

## 2021-07-24 LAB — MAGNESIUM: Magnesium: 1.7 mg/dL (ref 1.7–2.4)

## 2021-07-24 LAB — TROPONIN I (HIGH SENSITIVITY)
Troponin I (High Sensitivity): 7 ng/L (ref <18)
Troponin I (High Sensitivity): 9 ng/L (ref <18)

## 2021-07-24 MED ORDER — GUAIFENESIN ER 600 MG PO TB12: 600.0000 mg | ORAL_TABLET | Freq: Two times a day (BID) | ORAL | Status: DC | PRN

## 2021-07-24 MED ORDER — ASPIRIN EC 81 MG PO TBEC
81.0000 mg | DELAYED_RELEASE_TABLET | Freq: Every day | ORAL | Status: DC
  Administered 2021-07-24 – 2021-07-25 (×2): 81 mg via ORAL
  Filled 2021-07-24 (×2): qty 1

## 2021-07-24 MED ORDER — AMLODIPINE BESYLATE 5 MG PO TABS
10.0000 mg | ORAL_TABLET | Freq: Every day | ORAL | Status: DC
  Administered 2021-07-24 – 2021-07-25 (×2): 10 mg via ORAL
  Filled 2021-07-24 (×2): qty 2

## 2021-07-24 MED ORDER — PANTOPRAZOLE SODIUM 20 MG PO TBEC
20.0000 mg | DELAYED_RELEASE_TABLET | Freq: Every day | ORAL | Status: DC
  Administered 2021-07-24 – 2021-07-25 (×2): 20 mg via ORAL
  Filled 2021-07-24 (×2): qty 1

## 2021-07-24 MED ORDER — PREGABALIN 25 MG PO CAPS: 75.0000 mg | ORAL_CAPSULE | Freq: Three times a day (TID) | ORAL | Status: DC

## 2021-07-24 MED ORDER — TRAMADOL HCL 50 MG PO TABS
50.0000 mg | ORAL_TABLET | Freq: Three times a day (TID) | ORAL | Status: DC | PRN
  Administered 2021-07-24 (×2): 50 mg via ORAL
  Filled 2021-07-24 (×2): qty 1

## 2021-07-24 MED ORDER — FUROSEMIDE 20 MG PO TABS
40.0000 mg | ORAL_TABLET | Freq: Two times a day (BID) | ORAL | Status: DC
  Administered 2021-07-24 – 2021-07-25 (×3): 40 mg via ORAL
  Filled 2021-07-24 (×3): qty 2

## 2021-07-24 MED ORDER — PREGABALIN 75 MG PO CAPS
75.0000 mg | ORAL_CAPSULE | Freq: Three times a day (TID) | ORAL | Status: DC
  Administered 2021-07-24 – 2021-07-25 (×5): 75 mg via ORAL
  Filled 2021-07-24: qty 3
  Filled 2021-07-24: qty 1
  Filled 2021-07-24: qty 3
  Filled 2021-07-24: qty 1
  Filled 2021-07-24: qty 3

## 2021-07-24 MED ORDER — DULOXETINE HCL 20 MG PO CPEP
20.0000 mg | ORAL_CAPSULE | Freq: Every day | ORAL | Status: DC
  Administered 2021-07-24 – 2021-07-25 (×2): 20 mg via ORAL
  Filled 2021-07-24 (×2): qty 1

## 2021-07-24 MED ORDER — ATORVASTATIN CALCIUM 10 MG PO TABS
20.0000 mg | ORAL_TABLET | Freq: Every day | ORAL | Status: DC
  Administered 2021-07-24 – 2021-07-25 (×2): 20 mg via ORAL
  Filled 2021-07-24 (×2): qty 2

## 2021-07-24 NOTE — ED Provider Notes (Addendum)
?[facility] ?Provider Note ? ? ?CSN: [id number] ?Arrival date & time: 07/24/21  1112 ? ?  ? ?History ? ?Chief Complaint  ?Patient presents with  ? tingling in left 4th & 5th digits  ? ? ?Brian Silva is a 81 y.o. male. ? ?HPI ?81 year old male with a history of hypertension, obesity, OSA, venous stasis, asthma, hyperlipidemia presents to the ER with complaints of tingling to the fourth and fifth digits of his left hand which has been ongoing for the last 3 days.  Patient denies any injuries or falls.  He noted tingling to the left fifth digit about 2 days ago, this morning woke up with numbness and tingling to the fourth left digit.  Denies any headache, facial droop, difficulty speaking, though does endorse some left arm pain. No swelling, redness, warmth.  Patient states that he came home from long-term rehab 2 days ago, and his wheelchair does not fit through his bathroom and kitchen doorways.  He states that he has been sitting in the living room unable to use the restroom and has soiled himself multiple times.  He also states he has had poor p.o. intake over the last several days.  Denies any dizziness or syncope.  No chest pain or shortness of breath.  No fevers or chills he states he is "always cold". ?  ? ?Home Medications ?Prior to Admission medications   ?Medication Sig Start Date End Date Taking? Authorizing Provider  ?acetaminophen (TYLENOL) 500 MG tablet Take 2 tablets (1,000 mg total) by mouth every 8 (eight) hours. ?Patient taking differently: Take 1,000 mg by mouth every 8 (eight) hours as needed for mild pain. 09/14/19   Nita Sells, MD  ?albuterol (PROVENTIL HFA;VENTOLIN HFA) 108 (90 Base) MCG/ACT inhaler Inhale 2 puffs into the lungs every 6 (six) hours as needed. For shortness of breath 10/28/15   Theodis Blaze, MD  ?amLODipine (NORVASC) 10 MG tablet Take 10 mg by mouth daily.    [provider]  ?aspirin EC 81 MG tablet Take 81 mg by mouth  daily.    [provider]  ?atorvastatin (LIPITOR) 20 MG tablet Take 20 mg by mouth at bedtime.    [provider]  ?benzocaine-menthol (CHLORAEPTIC) 6-10 MG lozenge Take 1 lozenge by mouth every 2 (two) hours as needed for sore throat.    [provider]  ?Cholecalciferol (VITAMIN D3) 125 MCG (5000 UT) CAPS Take 5,000 Units by mouth daily. 06/07/20   [provider]  ?docusate sodium (COLACE) 100 MG capsule Take 100 mg by mouth 2 (two) times daily as needed (for constipation).    [provider]  ?DULoxetine (CYMBALTA) 20 MG capsule Take 20 mg by mouth daily.    [provider]  ?folic acid (FOLVITE) 1 MG tablet Take 1 tablet (1 mg total) by mouth daily. 07/11/20   Bonnielee Haff, MD  ?furosemide (LASIX) 40 MG tablet Take 40 mg by mouth 2 (two) times daily.    [provider]  ?guaiFENesin (MUCINEX) 600 MG 12 hr tablet Take 600 mg by mouth 2 (two) times daily as needed for cough.    [provider]  ?Infant Care Products [facility] EX) Apply 1 application topically in the morning and at bedtime. buttocks    [provider]  ?ipratropium-albuterol (DUONEB) 0.5-2.5 (3) MG/3ML SOLN Take 3 mLs by nebulization every 6 (six) hours as needed for shortness of breath or wheezing. 07/04/20   [provider]  ?Menthol, Topical  Analgesic, (BIOFREEZE) 4 % GEL Apply 1 application topically every 6 (six) hours as needed (joint pain).    [provider]  ?nystatin (MYCOSTATIN/NYSTOP) powder Apply 1 application topically See admin instructions. Apply to abdominal folds twice daily for redness or rash    [provider]  ?ondansetron (ZOFRAN) 4 MG tablet Take 4 mg by mouth every 8 (eight) hours as needed for nausea or vomiting.    [provider]  ?pantoprazole (PROTONIX) 20 MG tablet Take 20 mg by mouth daily. 06/30/20   [provider]  ?polyethylene glycol (MIRALAX / GLYCOLAX) 17 g packet Take 17 g by mouth  daily as needed for mild constipation.    [provider]  ?pregabalin (LYRICA) 75 MG capsule Take 75 mg by mouth 3 (three) times daily.    [provider]  ?Saccharomyces boulardii (PROBIOTIC) 250 MG CAPS Take 1 capsule by mouth 2 (two) times daily. 06/22/20   [provider]  ?traMADol (ULTRAM) 50 MG tablet Take 1 tablet (50 mg total) by mouth every 8 (eight) hours as needed for moderate pain. 07/11/20   Bonnielee Haff, MD  ?vitamin B-12 (CYANOCOBALAMIN) 500 MCG tablet Take 1 tablet (500 mcg total) by mouth daily. 07/11/20   Bonnielee Haff, MD  ?   ? ?Allergies    ?Patient has no known allergies.   ? ?Review of Systems   ?Review of Systems ?Ten systems reviewed and are negative for acute change, except as noted in the HPI.  ? ?Physical Exam ?Updated Vital Signs ?BP (!) 131/106   Pulse 86   Temp 98.9 ?F (37.2 ?C) (Oral)   Resp 18   Ht 5\' 10"  (1.778 m)   Wt (!) 161 kg   SpO2 100%   BMI 50.93 kg/m?  ?Physical Exam ?Vitals and nursing note reviewed.  ?Constitutional:   ?   General: He is not in acute distress. ?   Appearance: He is well-developed.  ?HENT:  ?   Head: Normocephalic and atraumatic.  ?Eyes:  ?   Conjunctiva/sclera: Conjunctivae normal.  ?Cardiovascular:  ?   Rate and Rhythm: Normal rate and regular rhythm.  ?   Heart sounds: No murmur heard. ?Pulmonary:  ?   Effort: Pulmonary effort is normal. No respiratory distress.  ?   Breath sounds: Normal breath sounds.  ?Abdominal:  ?   Palpations: Abdomen is soft.  ?   Tenderness: There is no abdominal tenderness.  ?Musculoskeletal:     ?   General: No swelling.  ?   Cervical back: Neck supple.  ?   Comments: Left grip strength slightly weaker than right. No swelling, erythema, warmth.  Light touch intact to fourth and left fifth digits. Full flexion and extension of the DIP and PIP joints.  No visible deformities noted.  2+ radial pulses.  Cap refill<2.  No midline tenderness to the cervical spine.  Moving all 4 extremities without  difficulty ?  ?Skin: ?   General: Skin is warm and dry.  ?   Capillary Refill: Capillary refill takes less than 2 seconds.  ?Neurological:  ?   General: No focal deficit present.  ?   Mental Status: He is alert and oriented to person, place, and time.  ?   Sensory: No sensory deficit.  ?   Motor: No weakness.  ?Psychiatric:     ?   Mood and Affect: Mood normal.  ? ? ?ED Results / Procedures / Treatments   ?Labs ?(all labs ordered are listed, but  only abnormal results are displayed) ?Labs Reviewed  ?CBC WITH DIFFERENTIAL/PLATELET - Abnormal; Notable for the following components:  ?    Result Value  ? RBC 3.81 (*)   ? Hemoglobin 10.9 (*)   ? HCT 34.4 (*)   ? All other components within normal limits  ?COMPREHENSIVE METABOLIC PANEL - Abnormal; Notable for the following components:  ? Glucose, Bld 174 (*)   ? BUN 25 (*)   ? Creatinine, Ser 1.91 (*)   ? Albumin 3.2 (*)   ? GFR, Estimated 35 (*)   ? All other components within normal limits  ?MAGNESIUM  ?URINALYSIS, ROUTINE W REFLEX MICROSCOPIC  ?TROPONIN I (HIGH SENSITIVITY)  ?TROPONIN I (HIGH SENSITIVITY)  ? ? ?EKG ?None ? ?Radiology ?CT Head Wo Contrast ? ?Result Date: 07/24/2021 ?CLINICAL DATA:  Left hand tingling/numbness. Cervical radiculopathy, no red flags. EXAM: CT HEAD WITHOUT CONTRAST CT CERVICAL SPINE WITHOUT CONTRAST TECHNIQUE: Multidetector CT imaging of the head and cervical spine was performed following the standard protocol without intravenous contrast. Multiplanar CT image reconstructions of the cervical spine were also generated. RADIATION DOSE REDUCTION: This exam was performed according to the departmental dose-optimization program which includes automated exposure control, adjustment of the mA and/or kV according to patient size and/or use of iterative reconstruction technique. COMPARISON:  Head CT 09/10/2019.  Cervical spine CT 09/10/2019. FINDINGS: CT HEAD FINDINGS Brain: Mild for age generalized parenchymal atrophy. There is no acute intracranial  hemorrhage. No demarcated cortical infarct. No extra-axial fluid collection. No evidence of an intracranial mass. No midline shift. Vascular: No hyperdense vessel.  Atherosclerotic calcifications. Skull: Nor

## 2021-07-24 NOTE — Progress Notes (Signed)
Patient stated he didn't want to go to SNF long term because he stated he cannot lose his apartment. Patient stated he will go SNF short term and then discharge home. Patient believes he can care for himself at home.  ?

## 2021-07-24 NOTE — Progress Notes (Signed)
SNF search pending.  ?

## 2021-07-24 NOTE — NC FL2 (Signed)
?[place] MEDICAID FL2 LEVEL OF CARE SCREENING TOOL  ?  ? ?IDENTIFICATION  ?Patient Name: ?Brian Silva Birthdate: 05-25-1940 Sex: male Admission Date (Current Location): ?07/24/2021  ?[place] and [place] Number: ? [place] ?  Facility and Address:  ?The . [facility], [place] [address] [place], [place] [address] ?     Provider Number: ?[id number]  ?Attending Physician Name and Address:  ?Davonna Belling, MD ? Relative Name and Phone Number:  ?  ?   ?Current Level of Care: ?Hospital Recommended Level of Care: ?[place] Prior Approval Number: ?  ? ?Date Approved/Denied: ?  PASRR Number: ?[id number] A ? ?Discharge Plan: ?SNF ?  ? ?Current Diagnoses: ?Patient Active Problem List  ? Diagnosis Date Noted  ? Bacteremia   ? Acute renal failure superimposed on stage 2 chronic kidney disease ([place]) 07/06/2020  ? Metabolic acidosis 92/42/6834  ? Uremia 07/06/2020  ? Hyperkalemia 07/06/2020  ? Chronic diastolic CHF (congestive heart failure) ([place]) 07/06/2020  ? Pneumonia of both lower lobes due to infectious organism 12/23/2019  ? CAP (community acquired pneumonia) 12/22/2019  ? Compression fracture of body of thoracic vertebra ([place]) 09/10/2019  ? Fall at home, initial encounter 09/10/2019  ? AKI (acute kidney injury) ([place]) 01/17/2018  ? Syncope, vasovagal 01/17/2018  ? Diabetes mellitus type II, non insulin dependent ([place]) 01/17/2018  ? Hyponatremia 01/17/2018  ? Syncope 01/17/2018  ? Depression 10/25/2015  ? Hyperlipidemia 10/25/2015  ? TINEA PEDIS 01/04/2010  ? TINEA VERSICOLOR 01/04/2010  ? URI 01/04/2010  ? HYPERGLYCEMIA 12/02/2008  ? ABDOMINAL WALL HERNIA 12/01/2007  ? OBESITY, MORBID 04/04/2007  ? SLEEP APNEA, OBSTRUCTIVE 04/04/2007  ? LOW BACK PAIN, CHRONIC 12/07/2004  ? HYPERLIPIDEMIA 08/12/2002  ? ALLERGIC RHINITIS, SEASONAL 08/12/2002  ? Essential hypertension 04/01/2000  ? Asthma 10/11/1997  ? SUPERFICIAL PHLEBITIS 05/06/1990  ? ? ?Orientation RESPIRATION BLADDER Height & Weight    ?  ?Self, Time, Situation, Place ? Normal Incontinent Weight: (!) 354 lb 15.1 oz (161 kg) ?Height:  5\' 10"  (177.8 cm)  ?BEHAVIORAL SYMPTOMS/MOOD NEUROLOGICAL BOWEL NUTRITION STATUS  ?    Incontinent Diet (Regular)  ?AMBULATORY STATUS COMMUNICATION OF NEEDS Skin   ?Extensive Assist Verbally Normal ?  ?  ?  ?    ?     ?     ? ? ?Personal Care Assistance Level of Assistance  ?Bathing, Feeding, Dressing Bathing Assistance: Limited assistance ?Feeding assistance: Independent ?Dressing Assistance: Limited assistance ?   ? ?Functional Limitations Info  ?Sight, Hearing, Speech Sight Info: Adequate ?Hearing Info: Adequate ?Speech Info: Adequate  ? ? ?SPECIAL CARE FACTORS FREQUENCY  ?    ?  ?  ?  ?  ?  ?  ?   ? ? ?Contractures Contractures Info: Not present  ? ? ?Additional Factors Info  ?Code Status, Allergies Code Status Info: Full ?Allergies Info: No known allergies listed ?  ?  ?  ?   ? ?Current Medications (07/24/2021):  This is the current hospital active medication list ?Current Facility-Administered Medications  ?Medication Dose Route Frequency Provider Last Rate Last Admin  ? amLODipine (NORVASC) tablet 10 mg  10 mg Oral Daily Sharyn Lull A, PA-C   10 mg at 07/24/21 1437  ? aspirin EC tablet 81 mg  81 mg Oral Daily Sharyn Lull A, PA-C   81 mg at 07/24/21 1437  ? atorvastatin (LIPITOR) tablet 20 mg  20 mg Oral QHS Belaya, Maria A, PA-C      ? DULoxetine (CYMBALTA) DR capsule  20 mg  20 mg Oral Daily Belaya, Maria A, PA-C      ? furosemide (LASIX) tablet 40 mg  40 mg Oral BID Sharyn Lull A, PA-C      ? guaiFENesin (MUCINEX) 12 hr tablet 600 mg  600 mg Oral BID PRN Sharyn Lull A, PA-C      ? pantoprazole (PROTONIX) EC tablet 20 mg  20 mg Oral Daily Belaya, Maria A, PA-C      ? pregabalin (LYRICA) capsule 75 mg  75 mg Oral TID Liz Beach, RPH   75 mg at 07/24/21 1438  ? traMADol (ULTRAM) tablet 50 mg  50 mg Oral Q8H PRN Sharyn Lull A, PA-C   50 mg at 07/24/21 1437  ? ?Current Outpatient Medications   ?Medication Sig Dispense Refill  ? acetaminophen (TYLENOL) 500 MG tablet Take 2 tablets (1,000 mg total) by mouth every 8 (eight) hours. (Patient taking differently: Take 1,000 mg by mouth every 8 (eight) hours as needed for mild pain.) 30 tablet 0  ? albuterol (PROVENTIL HFA;VENTOLIN HFA) 108 (90 Base) MCG/ACT inhaler Inhale 2 puffs into the lungs every 6 (six) hours as needed. For shortness of breath 1 Inhaler 1  ? amLODipine (NORVASC) 10 MG tablet Take 10 mg by mouth daily.    ? aspirin EC 81 MG tablet Take 81 mg by mouth daily.    ? atorvastatin (LIPITOR) 20 MG tablet Take 20 mg by mouth at bedtime.    ? benzocaine-menthol (CHLORAEPTIC) 6-10 MG lozenge Take 1 lozenge by mouth every 2 (two) hours as needed for sore throat.    ? Cholecalciferol (VITAMIN D3) 125 MCG (5000 UT) CAPS Take 5,000 Units by mouth daily.    ? docusate sodium (COLACE) 100 MG capsule Take 100 mg by mouth 2 (two) times daily as needed (for constipation).    ? DULoxetine (CYMBALTA) 20 MG capsule Take 20 mg by mouth daily.    ? folic acid (FOLVITE) 1 MG tablet Take 1 tablet (1 mg total) by mouth daily.    ? furosemide (LASIX) 40 MG tablet Take 40 mg by mouth 2 (two) times daily.    ? guaiFENesin (MUCINEX) 600 MG 12 hr tablet Take 600 mg by mouth 2 (two) times daily as needed for cough.    ? Infant Care Products (DERMACLOUD EX) Apply 1 application topically in the morning and at bedtime. buttocks    ? ipratropium-albuterol (DUONEB) 0.5-2.5 (3) MG/3ML SOLN Take 3 mLs by nebulization every 6 (six) hours as needed for shortness of breath or wheezing.    ? Menthol, Topical Analgesic, (BIOFREEZE) 4 % GEL Apply 1 application topically every 6 (six) hours as needed (joint pain).    ? nystatin (MYCOSTATIN/NYSTOP) powder Apply 1 application topically See admin instructions. Apply to abdominal folds twice daily for redness or rash    ? ondansetron (ZOFRAN) 4 MG tablet Take 4 mg by mouth every 8 (eight) hours as needed for nausea or vomiting.    ?  pantoprazole (PROTONIX) 20 MG tablet Take 20 mg by mouth daily.    ? polyethylene glycol (MIRALAX / GLYCOLAX) 17 g packet Take 17 g by mouth daily as needed for mild constipation.    ? pregabalin (LYRICA) 75 MG capsule Take 75 mg by mouth 3 (three) times daily.    ? Saccharomyces boulardii (PROBIOTIC) 250 MG CAPS Take 1 capsule by mouth 2 (two) times daily.    ? traMADol (ULTRAM) 50 MG tablet Take 1 tablet (50 mg total) by  mouth every 8 (eight) hours as needed for moderate pain. 20 tablet 0  ? vitamin B-12 (CYANOCOBALAMIN) 500 MCG tablet Take 1 tablet (500 mcg total) by mouth daily.    ? ? ? ?Discharge Medications: ?Please see discharge summary for a list of discharge medications. ? ?Relevant Imaging Results: ? ?Relevant Lab Results: ? ? ?Additional Information ?SSN [id number]/ Pt has had 2 Covid vaccines plus booster ? ?Raina Mina, LCSWA ? ? ? ? ?

## 2021-07-24 NOTE — ED Notes (Addendum)
Pt was incontinent of BM, cleaned pt and applied a clean brief. Pt states he had been sitting in his dirty brief for  few days, because he couldn't get up and clean himself. Pt's buttocks are excoriated bilat. ?

## 2021-07-24 NOTE — ED Triage Notes (Signed)
Pt arrived via [facility] from home for c/o tingling in left 4th and 5th digits of handx3 days. Pt denies any other sx. Pt is A&Ox4. Pt's left hand grip is slightly weaker than the right hand grip. ?

## 2021-07-24 NOTE — Discharge Instructions (Addendum)
You were evaluated in the Emergency Department and after careful evaluation, we did not find any emergent condition requiring admission or further testing in the hospital. ? ?Your CT scan showed cervical radiculopathy which is a pinched nerve in your neck. The lyrica you are taking should be helpful for this. If needed, the dose can be increased. ? ?Please return to the Emergency Department if you experience any worsening of your condition.  We encourage you to follow up with a primary care provider.  Thank you for allowing [name] to be a part of your care. ? ?Private [employer] Resources ? ?[facility] ?Address: [address], [place], [place] [address] ?Phone: [phone] ? ?[facility] ?Address: [address] [place], [place], [place] [address] ?Phone: [phone] ? ?[facility] ?Address: [address], [place], [place] [address] ?Phone: [phone] ? ?[facility] ?Address: [address] [place], [place] [address] ?Phone: [phone] ? ?[facility] ?Address: [address] [place], [place], [place] [address] ?Phone: [phone] ? ?[facility] ?Address:  [address], [place], [place] [address] ?Phone:  [phone] ? ?[facility] ?Address:  [address] ?Phone:  [phone] ? ?[email or web address] ? ?[facility] ?[phone]  ?  ?

## 2021-07-24 NOTE — ED Notes (Signed)
Inadvertently doc handoff from [facility].  Have not received handoff on this pt yet ?

## 2021-07-24 NOTE — Evaluation (Signed)
Physical Therapy Evaluation ?Patient Details ?Name: Brian Silva ?MRN: [id number] ?DOB: 08-30-1940 ?Today's Date: 07/24/2021 ? ?History of Present Illness ? Pt is a 81 y/o male admitted secondary to tingling in L 4th and 5th digits. Pt also with difficulty caring for himself after leaving LTC. PMH includes obesity, and asthma.  ?Clinical Impression ? Pt admitted secondary to problem above with deficits below. Pt requiring mod A to come to long sitting and supervision to roll from side to side for linen change. Unsafe to attempt further mobility with +1 assist. Pt recently left LTC, but is unable to care for himself. Recommending SNF level therapies at d/c to increase independence and safety. Will continue to follow acutely.    ?   ? ?Recommendations for follow up therapy are one component of a multi-disciplinary discharge planning process, led by the attending physician.  Recommendations may be updated based on patient status, additional functional criteria and insurance authorization. ? ?Follow Up Recommendations Skilled nursing-short term rehab (<3 hours/day) ? ?  ?Assistance Recommended at Discharge Frequent or constant Supervision/Assistance  ?Patient can return home with the following ? A lot of help with walking and/or transfers;A lot of help with bathing/dressing/bathroom;Assistance with cooking/housework;Help with stairs or ramp for entrance;Assist for transportation ? ?  ?Equipment Recommendations Hospital bed;BSC/3in1 (bariatric)  ?Recommendations for Other Services ?    ?  ?Functional Status Assessment Patient has had a recent decline in their functional status and demonstrates the ability to make significant improvements in function in a reasonable and predictable amount of time.  ? ?  ?Precautions / Restrictions Precautions ?Precautions: Fall ?Restrictions ?Weight Bearing Restrictions: No  ? ?  ? ?Mobility ? Bed Mobility ?Overal bed mobility: Needs Assistance ?Bed Mobility: Supine to Sit,  Rolling ?Rolling: Supervision ?  ?Supine to sit: Mod assist ?  ?  ?General bed mobility comments: Mod A to come into long sitting on stretcher. Unsafe to attempt further OOB mobility. Able to roll from side to side using bed rails for linen change. ?  ? ?Transfers ?  ?  ?  ?  ?  ?  ?  ?  ?  ?General transfer comment: not attempted with +1 assist ?  ? ?Ambulation/Gait ?  ?  ?  ?  ?  ?  ?  ?  ? ?Stairs ?  ?  ?  ?  ?  ? ?Wheelchair Mobility ?  ? ?Modified Rankin (Stroke Patients Only) ?  ? ?  ? ?Balance   ?  ?  ?  ?  ?  ?  ?  ?  ?  ?  ?  ?  ?  ?  ?  ?  ?  ?  ?   ? ? ? ?Pertinent Vitals/Pain Pain Assessment ?Pain Assessment: No/denies pain  ? ? ?Home Living Family/patient expects to be discharged to:: Skilled nursing facility ?  ?  ?  ?  ?  ?  ?  ?  ?  ?   ?  ?Prior Function Prior Level of Function : Needs assist ?  ?  ?  ?  ?  ?  ?Mobility Comments: Pt reports since he was discharged from LTC, he was unable to get out of his chair for 2 days. ?ADLs Comments: Has been soiling himself in his chair because he has been unable to get out of his chair. ?  ? ? ?Hand Dominance  ?   ? ?  ?Extremity/Trunk Assessment  ? Upper Extremity Assessment ?Upper Extremity  Assessment: Defer to OT evaluation ?  ? ?Lower Extremity Assessment ?Lower Extremity Assessment: Generalized weakness (was able to perform SLR and ankle pumps) ?  ? ?Cervical / Trunk Assessment ?Cervical / Trunk Assessment: Other exceptions ?Cervical / Trunk Exceptions: increased body habitus  ?Communication  ? Communication: No difficulties  ?Cognition Arousal/Alertness: Awake/alert ?Behavior During Therapy: [facility] for tasks assessed/performed ?Overall Cognitive Status: No family/caregiver present to determine baseline cognitive functioning ?  ?  ?  ?  ?  ?  ?  ?  ?  ?  ?  ?  ?  ?  ?  ?  ?General Comments: Slightly anxious. Decreased awareness of safety ?  ?  ? ?  ?General Comments   ? ?  ?Exercises    ? ?Assessment/Plan  ?  ?PT Assessment Patient needs continued PT  services  ?PT Problem List Decreased strength;Decreased balance;Decreased activity tolerance;Decreased mobility;Decreased knowledge of use of DME;Decreased knowledge of precautions ? ?   ?  ?PT Treatment Interventions DME instruction;Gait training;Functional mobility training;Therapeutic exercise;Therapeutic activities;Balance training;Neuromuscular re-education;Wheelchair mobility training   ? ?PT Goals (Current goals can be found in the Care Plan section)  ?Acute Rehab PT Goals ?Patient Stated Goal: to be independent ?PT Goal Formulation: With patient ?Time For Goal Achievement: 08/07/21 ?Potential to Achieve Goals: Fair ? ?  ?Frequency Min 2X/week ?  ? ? ?Co-evaluation   ?  ?  ?  ?  ? ? ?  ?AM-PAC PT "6 Clicks" Mobility  ?Outcome Measure Help needed turning from your back to your side while in a flat bed without using bedrails?: A Little ?Help needed moving from lying on your back to sitting on the side of a flat bed without using bedrails?: A Lot ?Help needed moving to and from a bed to a chair (including a wheelchair)?: Total ?Help needed standing up from a chair using your arms (e.g., wheelchair or bedside chair)?: Total ?Help needed to walk in hospital room?: Total ?Help needed climbing 3-5 steps with a railing? : Total ?6 Click Score: 9 ? ?  ?End of Session   ?Activity Tolerance: Patient limited by fatigue ?Patient left: in bed;with call bell/phone within reach (on stretcher in ED) ?Nurse Communication: Mobility status ?PT Visit Diagnosis: Unsteadiness on feet (R26.81);Muscle weakness (generalized) (M62.81);Difficulty in walking, not elsewhere classified (R26.2) ?  ? ?Time: 1779-3903 ?PT Time Calculation (min) (ACUTE ONLY): 23 min ? ? ?Charges:   PT Evaluation ?$PT Eval Moderate Complexity: 1 Mod ?PT Treatments ?$Therapeutic Activity: 8-22 mins ?  ?   ? ? ?Reuel Derby, PT, DPT  ?Acute Rehabilitation Services  ?Pager: [phone] ?Office: [phone] ? ? ?[place] ?07/24/2021, 4:29 PM ?

## 2021-07-24 NOTE — ED Notes (Signed)
Patient transported to X-ray 

## 2021-07-24 NOTE — ED Notes (Signed)
Patient transported to CT 

## 2021-07-24 NOTE — ED Notes (Signed)
Provided pt w/a [place] bag and 2 diet cokes. ?

## 2021-07-25 DIAGNOSIS — E785 Hyperlipidemia, unspecified: Secondary | ICD-10-CM | POA: Diagnosis not present

## 2021-07-25 DIAGNOSIS — J029 Acute pharyngitis, unspecified: Secondary | ICD-10-CM | POA: Diagnosis not present

## 2021-07-25 DIAGNOSIS — R197 Diarrhea, unspecified: Secondary | ICD-10-CM | POA: Diagnosis not present

## 2021-07-25 DIAGNOSIS — F339 Major depressive disorder, recurrent, unspecified: Secondary | ICD-10-CM | POA: Diagnosis not present

## 2021-07-25 DIAGNOSIS — N182 Chronic kidney disease, stage 2 (mild): Secondary | ICD-10-CM | POA: Diagnosis not present

## 2021-07-25 DIAGNOSIS — R262 Difficulty in walking, not elsewhere classified: Secondary | ICD-10-CM | POA: Diagnosis not present

## 2021-07-25 DIAGNOSIS — R5381 Other malaise: Secondary | ICD-10-CM | POA: Diagnosis not present

## 2021-07-25 DIAGNOSIS — M25569 Pain in unspecified knee: Secondary | ICD-10-CM | POA: Diagnosis not present

## 2021-07-25 DIAGNOSIS — I10 Essential (primary) hypertension: Secondary | ICD-10-CM | POA: Diagnosis not present

## 2021-07-25 DIAGNOSIS — J45909 Unspecified asthma, uncomplicated: Secondary | ICD-10-CM | POA: Diagnosis not present

## 2021-07-25 DIAGNOSIS — R110 Nausea: Secondary | ICD-10-CM | POA: Diagnosis not present

## 2021-07-25 DIAGNOSIS — J301 Allergic rhinitis due to pollen: Secondary | ICD-10-CM | POA: Diagnosis not present

## 2021-07-25 DIAGNOSIS — I5032 Chronic diastolic (congestive) heart failure: Secondary | ICD-10-CM | POA: Diagnosis not present

## 2021-07-25 DIAGNOSIS — R6889 Other general symptoms and signs: Secondary | ICD-10-CM | POA: Diagnosis not present

## 2021-07-25 DIAGNOSIS — M6281 Muscle weakness (generalized): Secondary | ICD-10-CM | POA: Diagnosis not present

## 2021-07-25 DIAGNOSIS — Z79899 Other long term (current) drug therapy: Secondary | ICD-10-CM | POA: Diagnosis not present

## 2021-07-25 DIAGNOSIS — M4722 Other spondylosis with radiculopathy, cervical region: Secondary | ICD-10-CM | POA: Diagnosis not present

## 2021-07-25 DIAGNOSIS — E46 Unspecified protein-calorie malnutrition: Secondary | ICD-10-CM | POA: Diagnosis not present

## 2021-07-25 DIAGNOSIS — M5412 Radiculopathy, cervical region: Secondary | ICD-10-CM | POA: Diagnosis not present

## 2021-07-25 DIAGNOSIS — G63 Polyneuropathy in diseases classified elsewhere: Secondary | ICD-10-CM | POA: Diagnosis not present

## 2021-07-25 DIAGNOSIS — K5900 Constipation, unspecified: Secondary | ICD-10-CM | POA: Diagnosis not present

## 2021-07-25 DIAGNOSIS — D649 Anemia, unspecified: Secondary | ICD-10-CM | POA: Diagnosis not present

## 2021-07-25 DIAGNOSIS — Z743 Need for continuous supervision: Secondary | ICD-10-CM | POA: Diagnosis not present

## 2021-07-25 DIAGNOSIS — E118 Type 2 diabetes mellitus with unspecified complications: Secondary | ICD-10-CM | POA: Diagnosis not present

## 2021-07-25 DIAGNOSIS — Z7982 Long term (current) use of aspirin: Secondary | ICD-10-CM | POA: Diagnosis not present

## 2021-07-25 DIAGNOSIS — G4733 Obstructive sleep apnea (adult) (pediatric): Secondary | ICD-10-CM | POA: Diagnosis not present

## 2021-07-25 DIAGNOSIS — I872 Venous insufficiency (chronic) (peripheral): Secondary | ICD-10-CM | POA: Diagnosis not present

## 2021-07-25 DIAGNOSIS — Z1159 Encounter for screening for other viral diseases: Secondary | ICD-10-CM | POA: Diagnosis not present

## 2021-07-25 DIAGNOSIS — E559 Vitamin D deficiency, unspecified: Secondary | ICD-10-CM | POA: Diagnosis not present

## 2021-07-25 DIAGNOSIS — L98411 Non-pressure chronic ulcer of buttock limited to breakdown of skin: Secondary | ICD-10-CM | POA: Diagnosis not present

## 2021-07-25 NOTE — ED Notes (Signed)
RN provided pt breakfast tray. Pt declined tray at the moment.  ?

## 2021-07-25 NOTE — Evaluation (Signed)
Occupational Therapy Evaluation ?Patient Details ?Name: Brian Silva ?MRN: [id number] ?DOB: Apr 02, 1941 ?Today's Date: 07/25/2021 ? ? ?History of Present Illness Pt is a 81 y/o male admitted secondary to tingling in L 4th and 5th digits. Pt also with difficulty caring for himself after leaving LTC. PMH includes obesity, and asthma.  ? ?Clinical Impression ?  ?Pt admitted for above and presents with problem list below, including generalized weakness, impaired balance and decreased activity tolerance.  He is initially disoriented to time (reports May), but follow simple commands with slow processing and decreased problem solving. He continues to report decreased sensation in L 4/5 digits but improving in 4th. Able to scoot towards HOB (using trendelenburg positioning of stretcher) and long sit with min assist, completing ADLs with setup to max assist.  Declines EOB today.  Recommend continued OT services acutely and after dc at SNF level to optimize independence, safety with ADLs and mobility.  ?   ? ?Recommendations for follow up therapy are one component of a multi-disciplinary discharge planning process, led by the attending physician.  Recommendations may be updated based on patient status, additional functional criteria and insurance authorization.  ? ?Follow Up Recommendations ? Skilled nursing-short term rehab (<3 hours/day)  ?  ?Assistance Recommended at Discharge Frequent or constant Supervision/Assistance  ?Patient can return home with the following A lot of help with walking and/or transfers;A lot of help with bathing/dressing/bathroom;Assistance with cooking/housework;Direct supervision/assist for medications management;Direct supervision/assist for financial management;Assist for transportation;Help with stairs or ramp for entrance ? ?  ?Functional Status Assessment ? Patient has had a recent decline in their functional status and demonstrates the ability to make significant improvements in function in a  reasonable and predictable amount of time.  ?Equipment Recommendations ? None recommended by OT  ?  ?Recommendations for Other Services   ? ? ?  ?Precautions / Restrictions Precautions ?Precautions: Fall ?Restrictions ?Weight Bearing Restrictions: No  ? ?  ? ?Mobility Bed Mobility ?Overal bed mobility: Needs Assistance ?Bed Mobility: Supine to Sit, Sit to Supine ?Rolling: Supervision ?  ?Supine to sit: Min assist ?Sit to supine: Min guard ?  ?General bed mobility comments: Repositioned towards [facility] with stretcher in trendelenburg position with min assist, pulled into long sitting with min assist but declined further bed mobility at this time. ?  ? ?Transfers ?  ?  ?  ?  ?  ?  ?  ?  ?  ?  ?  ? ?  ?Balance   ?  ?  ?  ?  ?  ?  ?  ?  ?  ?  ?  ?  ?  ?  ?  ?  ?  ?  ?   ? ?ADL either performed or assessed with clinical judgement  ? ?ADL Overall ADL's : Needs assistance/impaired ?  ?  ?Grooming: Set up;Sitting ?  ?  ?  ?  ?  ?Upper Body Dressing : Minimal assistance;Sitting ?  ?Lower Body Dressing: Total assistance;Sitting/lateral leans ?Lower Body Dressing Details (indicate cue type and reason): long sitting in bed, typically uses AE ?  ?Toilet Transfer Details (indicate cue type and reason): pt declined ?  ?  ?  ?  ?  ?General ADL Comments: pt repositioned in bed, declined EOB or OOB  ? ? ? ?Vision   ?   ?   ?Perception   ?  ?Praxis   ?  ? ?Pertinent Vitals/Pain Pain Assessment ?Pain Assessment: No/denies pain  ? ? ? ?Hand  Dominance Right ?  ?Extremity/Trunk Assessment Upper Extremity Assessment ?Upper Extremity Assessment: Generalized weakness;LUE deficits/detail ?LUE Deficits / Details: reports tingling digits 4/5, improving in digit 4 since admission. ?LUE Sensation: decreased light touch ?LUE Coordination: WNL ?  ?Lower Extremity Assessment ?Lower Extremity Assessment: Defer to PT evaluation ?  ?Cervical / Trunk Assessment ?Cervical / Trunk Assessment: Other exceptions ?Cervical / Trunk Exceptions: increased body  habitus ?  ?Communication Communication ?Communication: No difficulties ?  ?Cognition Arousal/Alertness: Awake/alert ?Behavior During Therapy: [facility] for tasks assessed/performed ?Overall Cognitive Status: No family/caregiver present to determine baseline cognitive functioning ?Area of Impairment: Awareness, Problem solving, Orientation ?  ?  ?  ?  ?  ?  ?  ?  ?Orientation Level: Disoriented to, Time ?  ?  ?  ?  ?Awareness: Emergent ?Problem Solving: Requires verbal cues, Slow processing ?General Comments: pt reports May, requires increased time for processing with decreased problem sovling. self limiting behaviors. ?  ?  ?General Comments  pt declined EOB today ? ?  ?Exercises   ?  ?Shoulder Instructions    ? ? ?Home Living Family/patient expects to be discharged to:: Skilled nursing facility ?  ?  ?  ?  ?  ?  ?  ?  ?  ?  ?  ?  ?  ?  ?  ?  ?  ?  ? ?  ?Prior Functioning/Environment Prior Level of Function : Needs assist;Patient poor historian/Family not available ?  ?  ?  ?  ?  ?  ?Mobility Comments: Pt reports since he was discharged from LTC, he was unable to get out of his chair for 2 days. Prior to dc from LTC able to walk and transfer. ?ADLs Comments: Has been soiling himself in his chair because he has been unable to get out of his chair. Prior to leaving LTC able to bathe/dress self, uses AE. ?  ? ?  ?  ?OT Problem List: Decreased strength;Decreased activity tolerance;Impaired balance (sitting and/or standing);Decreased cognition;Decreased safety awareness;Decreased knowledge of use of DME or AE;Decreased knowledge of precautions;Obesity;Impaired sensation ?  ?   ?OT Treatment/Interventions: Self-care/ADL training;Therapeutic exercise;DME and/or AE instruction;Therapeutic activities;Patient/family education;Balance training;Cognitive remediation/compensation  ?  ?OT Goals(Current goals can be found in the care plan section) Acute Rehab OT Goals ?Patient Stated Goal: get back home ?OT Goal Formulation: With  patient ?Time For Goal Achievement: 08/08/21 ?Potential to Achieve Goals: Fair  ?OT Frequency: Min 2X/week ?  ? ?Co-evaluation   ?  ?  ?  ?  ? ?  ?AM-PAC OT "6 Clicks" Daily Activity     ?Outcome Measure Help from another person eating meals?: A Little ?Help from another person taking care of personal grooming?: A Little ?Help from another person toileting, which includes using toliet, bedpan, or urinal?: A Lot ?Help from another person bathing (including washing, rinsing, drying)?: A Lot ?Help from another person to put on and taking off regular upper body clothing?: A Little ?Help from another person to put on and taking off regular lower body clothing?: A Lot ?6 Click Score: 15 ?  ?End of Session Nurse Communication: Mobility status ? ?Activity Tolerance: Patient tolerated treatment well ?Patient left: in bed ? ?OT Visit Diagnosis: Other abnormalities of gait and mobility (R26.89);Muscle weakness (generalized) (M62.81);Other symptoms and signs involving cognitive function  ?              ?Time: 3500-9381 ?OT Time Calculation (min): 10 min ?Charges:  OT General Charges ?$OT Visit: 1 Visit ?OT  Evaluation ?$OT Eval Moderate Complexity: 1 Mod ? ?Jolaine Artist, OT ?Acute Rehabilitation Services ?Pager [phone] ?Office [phone] ? ? ?Delight Stare ?07/25/2021, 11:44 AM ?

## 2021-07-25 NOTE — Progress Notes (Signed)
Patient agreed to discharge to [facility]. Patient stated he is not interested in long term care and does not want to lose his apartment. Patient stated when he gets out of rehab he plans on hiring an aide to assist him in the home. CSW will attached nursing aides to patients AVS and also give him a physical copy.  ?

## 2021-07-25 NOTE — ED Notes (Signed)
Pt provided lunch tray. Pt declined food. ?

## 2021-07-25 NOTE — Progress Notes (Signed)
Patient is going to [employer] [facility], Number for report [phone]. CSW notified patients nurse.  ?

## 2021-07-25 NOTE — ED Notes (Signed)
RN attempted to call report but placed on hold for 10 minutes. x1 ?

## 2021-07-25 NOTE — ED Notes (Signed)
Patient lying quietly in bed, denies any needs at this time.  ?

## 2021-07-26 DIAGNOSIS — M4722 Other spondylosis with radiculopathy, cervical region: Secondary | ICD-10-CM | POA: Diagnosis not present

## 2021-07-26 DIAGNOSIS — E785 Hyperlipidemia, unspecified: Secondary | ICD-10-CM | POA: Diagnosis not present

## 2021-07-26 DIAGNOSIS — F339 Major depressive disorder, recurrent, unspecified: Secondary | ICD-10-CM | POA: Diagnosis not present

## 2021-07-26 DIAGNOSIS — I10 Essential (primary) hypertension: Secondary | ICD-10-CM | POA: Diagnosis not present

## 2021-07-26 DIAGNOSIS — M6281 Muscle weakness (generalized): Secondary | ICD-10-CM | POA: Diagnosis not present

## 2021-07-26 DIAGNOSIS — N182 Chronic kidney disease, stage 2 (mild): Secondary | ICD-10-CM | POA: Diagnosis not present

## 2021-07-26 DIAGNOSIS — Z1159 Encounter for screening for other viral diseases: Secondary | ICD-10-CM | POA: Diagnosis not present

## 2021-07-26 DIAGNOSIS — D649 Anemia, unspecified: Secondary | ICD-10-CM | POA: Diagnosis not present

## 2021-07-26 DIAGNOSIS — I5032 Chronic diastolic (congestive) heart failure: Secondary | ICD-10-CM | POA: Diagnosis not present

## 2021-07-26 DIAGNOSIS — G63 Polyneuropathy in diseases classified elsewhere: Secondary | ICD-10-CM | POA: Diagnosis not present

## 2021-07-26 DIAGNOSIS — G4733 Obstructive sleep apnea (adult) (pediatric): Secondary | ICD-10-CM | POA: Diagnosis not present

## 2021-07-27 DIAGNOSIS — E785 Hyperlipidemia, unspecified: Secondary | ICD-10-CM | POA: Diagnosis not present

## 2021-07-27 DIAGNOSIS — D649 Anemia, unspecified: Secondary | ICD-10-CM | POA: Diagnosis not present

## 2021-07-27 DIAGNOSIS — Z1159 Encounter for screening for other viral diseases: Secondary | ICD-10-CM | POA: Diagnosis not present

## 2021-07-27 DIAGNOSIS — G4733 Obstructive sleep apnea (adult) (pediatric): Secondary | ICD-10-CM | POA: Diagnosis not present

## 2021-07-27 DIAGNOSIS — M6281 Muscle weakness (generalized): Secondary | ICD-10-CM | POA: Diagnosis not present

## 2021-07-27 DIAGNOSIS — M4722 Other spondylosis with radiculopathy, cervical region: Secondary | ICD-10-CM | POA: Diagnosis not present

## 2021-07-27 DIAGNOSIS — I5032 Chronic diastolic (congestive) heart failure: Secondary | ICD-10-CM | POA: Diagnosis not present

## 2021-07-27 DIAGNOSIS — F339 Major depressive disorder, recurrent, unspecified: Secondary | ICD-10-CM | POA: Diagnosis not present

## 2021-07-27 DIAGNOSIS — I10 Essential (primary) hypertension: Secondary | ICD-10-CM | POA: Diagnosis not present

## 2021-07-27 DIAGNOSIS — N182 Chronic kidney disease, stage 2 (mild): Secondary | ICD-10-CM | POA: Diagnosis not present

## 2021-07-27 DIAGNOSIS — J301 Allergic rhinitis due to pollen: Secondary | ICD-10-CM | POA: Diagnosis not present

## 2021-07-31 DIAGNOSIS — Z1159 Encounter for screening for other viral diseases: Secondary | ICD-10-CM | POA: Diagnosis not present

## 2021-07-31 DIAGNOSIS — M4722 Other spondylosis with radiculopathy, cervical region: Secondary | ICD-10-CM | POA: Diagnosis not present

## 2021-07-31 DIAGNOSIS — G4733 Obstructive sleep apnea (adult) (pediatric): Secondary | ICD-10-CM | POA: Diagnosis not present

## 2021-07-31 DIAGNOSIS — D649 Anemia, unspecified: Secondary | ICD-10-CM | POA: Diagnosis not present

## 2021-07-31 DIAGNOSIS — N182 Chronic kidney disease, stage 2 (mild): Secondary | ICD-10-CM | POA: Diagnosis not present

## 2021-07-31 DIAGNOSIS — I5032 Chronic diastolic (congestive) heart failure: Secondary | ICD-10-CM | POA: Diagnosis not present

## 2021-07-31 DIAGNOSIS — F339 Major depressive disorder, recurrent, unspecified: Secondary | ICD-10-CM | POA: Diagnosis not present

## 2021-07-31 DIAGNOSIS — E785 Hyperlipidemia, unspecified: Secondary | ICD-10-CM | POA: Diagnosis not present

## 2021-07-31 DIAGNOSIS — J301 Allergic rhinitis due to pollen: Secondary | ICD-10-CM | POA: Diagnosis not present

## 2021-07-31 DIAGNOSIS — M6281 Muscle weakness (generalized): Secondary | ICD-10-CM | POA: Diagnosis not present

## 2021-07-31 DIAGNOSIS — I10 Essential (primary) hypertension: Secondary | ICD-10-CM | POA: Diagnosis not present

## 2021-08-02 DIAGNOSIS — I5032 Chronic diastolic (congestive) heart failure: Secondary | ICD-10-CM | POA: Diagnosis not present

## 2021-08-02 DIAGNOSIS — E785 Hyperlipidemia, unspecified: Secondary | ICD-10-CM | POA: Diagnosis not present

## 2021-08-02 DIAGNOSIS — N182 Chronic kidney disease, stage 2 (mild): Secondary | ICD-10-CM | POA: Diagnosis not present

## 2021-08-02 DIAGNOSIS — E118 Type 2 diabetes mellitus with unspecified complications: Secondary | ICD-10-CM | POA: Diagnosis not present

## 2021-08-02 DIAGNOSIS — J45909 Unspecified asthma, uncomplicated: Secondary | ICD-10-CM | POA: Diagnosis not present

## 2021-08-02 DIAGNOSIS — E46 Unspecified protein-calorie malnutrition: Secondary | ICD-10-CM | POA: Diagnosis not present

## 2021-08-02 DIAGNOSIS — F339 Major depressive disorder, recurrent, unspecified: Secondary | ICD-10-CM | POA: Diagnosis not present

## 2021-08-02 DIAGNOSIS — M6281 Muscle weakness (generalized): Secondary | ICD-10-CM | POA: Diagnosis not present

## 2021-08-02 DIAGNOSIS — D649 Anemia, unspecified: Secondary | ICD-10-CM | POA: Diagnosis not present

## 2021-08-02 DIAGNOSIS — L98411 Non-pressure chronic ulcer of buttock limited to breakdown of skin: Secondary | ICD-10-CM | POA: Diagnosis not present

## 2021-08-02 DIAGNOSIS — G4733 Obstructive sleep apnea (adult) (pediatric): Secondary | ICD-10-CM | POA: Diagnosis not present

## 2021-08-04 DIAGNOSIS — M6281 Muscle weakness (generalized): Secondary | ICD-10-CM | POA: Diagnosis not present

## 2021-08-04 DIAGNOSIS — R262 Difficulty in walking, not elsewhere classified: Secondary | ICD-10-CM | POA: Diagnosis not present

## 2021-08-04 DIAGNOSIS — R5381 Other malaise: Secondary | ICD-10-CM | POA: Diagnosis not present

## 2021-08-04 DIAGNOSIS — M25569 Pain in unspecified knee: Secondary | ICD-10-CM | POA: Diagnosis not present

## 2021-08-08 DIAGNOSIS — L98411 Non-pressure chronic ulcer of buttock limited to breakdown of skin: Secondary | ICD-10-CM | POA: Diagnosis not present

## 2021-08-08 DIAGNOSIS — E46 Unspecified protein-calorie malnutrition: Secondary | ICD-10-CM | POA: Diagnosis not present

## 2021-08-08 DIAGNOSIS — E118 Type 2 diabetes mellitus with unspecified complications: Secondary | ICD-10-CM | POA: Diagnosis not present

## 2021-08-08 DIAGNOSIS — J45909 Unspecified asthma, uncomplicated: Secondary | ICD-10-CM | POA: Diagnosis not present

## 2021-08-09 DIAGNOSIS — R262 Difficulty in walking, not elsewhere classified: Secondary | ICD-10-CM | POA: Diagnosis not present

## 2021-08-09 DIAGNOSIS — R5381 Other malaise: Secondary | ICD-10-CM | POA: Diagnosis not present

## 2021-08-09 DIAGNOSIS — M25569 Pain in unspecified knee: Secondary | ICD-10-CM | POA: Diagnosis not present

## 2021-08-09 DIAGNOSIS — M6281 Muscle weakness (generalized): Secondary | ICD-10-CM | POA: Diagnosis not present

## 2021-08-14 DIAGNOSIS — M4722 Other spondylosis with radiculopathy, cervical region: Secondary | ICD-10-CM | POA: Diagnosis not present

## 2021-08-14 DIAGNOSIS — F339 Major depressive disorder, recurrent, unspecified: Secondary | ICD-10-CM | POA: Diagnosis not present

## 2021-08-14 DIAGNOSIS — G4733 Obstructive sleep apnea (adult) (pediatric): Secondary | ICD-10-CM | POA: Diagnosis not present

## 2021-08-14 DIAGNOSIS — M6281 Muscle weakness (generalized): Secondary | ICD-10-CM | POA: Diagnosis not present

## 2021-08-14 DIAGNOSIS — E785 Hyperlipidemia, unspecified: Secondary | ICD-10-CM | POA: Diagnosis not present

## 2021-08-14 DIAGNOSIS — D649 Anemia, unspecified: Secondary | ICD-10-CM | POA: Diagnosis not present

## 2021-08-14 DIAGNOSIS — Z1159 Encounter for screening for other viral diseases: Secondary | ICD-10-CM | POA: Diagnosis not present

## 2021-08-14 DIAGNOSIS — N182 Chronic kidney disease, stage 2 (mild): Secondary | ICD-10-CM | POA: Diagnosis not present

## 2021-08-14 DIAGNOSIS — G63 Polyneuropathy in diseases classified elsewhere: Secondary | ICD-10-CM | POA: Diagnosis not present

## 2021-08-14 DIAGNOSIS — I10 Essential (primary) hypertension: Secondary | ICD-10-CM | POA: Diagnosis not present

## 2021-08-14 DIAGNOSIS — I5032 Chronic diastolic (congestive) heart failure: Secondary | ICD-10-CM | POA: Diagnosis not present

## 2021-08-24 DIAGNOSIS — M4722 Other spondylosis with radiculopathy, cervical region: Secondary | ICD-10-CM | POA: Diagnosis not present

## 2021-08-24 DIAGNOSIS — Z1159 Encounter for screening for other viral diseases: Secondary | ICD-10-CM | POA: Diagnosis not present

## 2021-08-24 DIAGNOSIS — I5032 Chronic diastolic (congestive) heart failure: Secondary | ICD-10-CM | POA: Diagnosis not present

## 2021-08-24 DIAGNOSIS — M6281 Muscle weakness (generalized): Secondary | ICD-10-CM | POA: Diagnosis not present

## 2021-08-24 DIAGNOSIS — F339 Major depressive disorder, recurrent, unspecified: Secondary | ICD-10-CM | POA: Diagnosis not present

## 2021-08-24 DIAGNOSIS — G4733 Obstructive sleep apnea (adult) (pediatric): Secondary | ICD-10-CM | POA: Diagnosis not present

## 2021-08-24 DIAGNOSIS — N182 Chronic kidney disease, stage 2 (mild): Secondary | ICD-10-CM | POA: Diagnosis not present

## 2021-08-24 DIAGNOSIS — E785 Hyperlipidemia, unspecified: Secondary | ICD-10-CM | POA: Diagnosis not present

## 2021-08-24 DIAGNOSIS — D649 Anemia, unspecified: Secondary | ICD-10-CM | POA: Diagnosis not present

## 2021-08-24 DIAGNOSIS — I10 Essential (primary) hypertension: Secondary | ICD-10-CM | POA: Diagnosis not present

## 2021-08-24 DIAGNOSIS — J301 Allergic rhinitis due to pollen: Secondary | ICD-10-CM | POA: Diagnosis not present

## 2021-08-25 DIAGNOSIS — R110 Nausea: Secondary | ICD-10-CM | POA: Diagnosis not present

## 2021-08-25 DIAGNOSIS — R197 Diarrhea, unspecified: Secondary | ICD-10-CM | POA: Diagnosis not present

## 2021-08-25 DIAGNOSIS — J029 Acute pharyngitis, unspecified: Secondary | ICD-10-CM | POA: Diagnosis not present

## 2021-08-25 DIAGNOSIS — K5900 Constipation, unspecified: Secondary | ICD-10-CM | POA: Diagnosis not present

## 2021-08-28 DIAGNOSIS — Z1159 Encounter for screening for other viral diseases: Secondary | ICD-10-CM | POA: Diagnosis not present

## 2021-08-28 DIAGNOSIS — E785 Hyperlipidemia, unspecified: Secondary | ICD-10-CM | POA: Diagnosis not present

## 2021-08-28 DIAGNOSIS — I5032 Chronic diastolic (congestive) heart failure: Secondary | ICD-10-CM | POA: Diagnosis not present

## 2021-08-28 DIAGNOSIS — N182 Chronic kidney disease, stage 2 (mild): Secondary | ICD-10-CM | POA: Diagnosis not present

## 2021-08-28 DIAGNOSIS — F339 Major depressive disorder, recurrent, unspecified: Secondary | ICD-10-CM | POA: Diagnosis not present

## 2021-08-28 DIAGNOSIS — I10 Essential (primary) hypertension: Secondary | ICD-10-CM | POA: Diagnosis not present

## 2021-08-28 DIAGNOSIS — M4722 Other spondylosis with radiculopathy, cervical region: Secondary | ICD-10-CM | POA: Diagnosis not present

## 2021-08-28 DIAGNOSIS — D649 Anemia, unspecified: Secondary | ICD-10-CM | POA: Diagnosis not present

## 2021-08-28 DIAGNOSIS — G4733 Obstructive sleep apnea (adult) (pediatric): Secondary | ICD-10-CM | POA: Diagnosis not present

## 2021-08-28 DIAGNOSIS — G63 Polyneuropathy in diseases classified elsewhere: Secondary | ICD-10-CM | POA: Diagnosis not present

## 2021-08-28 DIAGNOSIS — M6281 Muscle weakness (generalized): Secondary | ICD-10-CM | POA: Diagnosis not present

## 2021-09-05 DIAGNOSIS — M4722 Other spondylosis with radiculopathy, cervical region: Secondary | ICD-10-CM | POA: Diagnosis not present

## 2021-09-05 DIAGNOSIS — E118 Type 2 diabetes mellitus with unspecified complications: Secondary | ICD-10-CM | POA: Diagnosis not present

## 2021-09-05 DIAGNOSIS — G4733 Obstructive sleep apnea (adult) (pediatric): Secondary | ICD-10-CM | POA: Diagnosis not present

## 2021-09-05 DIAGNOSIS — M6281 Muscle weakness (generalized): Secondary | ICD-10-CM | POA: Diagnosis not present

## 2021-09-05 DIAGNOSIS — I10 Essential (primary) hypertension: Secondary | ICD-10-CM | POA: Diagnosis not present

## 2021-09-05 DIAGNOSIS — I5032 Chronic diastolic (congestive) heart failure: Secondary | ICD-10-CM | POA: Diagnosis not present

## 2021-09-06 ENCOUNTER — Emergency Department (HOSPITAL_COMMUNITY)
Admission: EM | Admit: 2021-09-06 | Discharge: 2021-09-07 | Disposition: A | Discharge status: Home / Self Care | Payer: Medicare Other | Attending: Emergency Medicine | Admitting: Emergency Medicine

## 2021-09-06 ENCOUNTER — Emergency Department (HOSPITAL_COMMUNITY): Payer: Medicare Other

## 2021-09-06 DIAGNOSIS — Z79899 Other long term (current) drug therapy: Secondary | ICD-10-CM | POA: Diagnosis not present

## 2021-09-06 DIAGNOSIS — I10 Essential (primary) hypertension: Secondary | ICD-10-CM | POA: Insufficient documentation

## 2021-09-06 DIAGNOSIS — R600 Localized edema: Secondary | ICD-10-CM | POA: Diagnosis not present

## 2021-09-06 DIAGNOSIS — Z7982 Long term (current) use of aspirin: Secondary | ICD-10-CM | POA: Diagnosis not present

## 2021-09-06 DIAGNOSIS — J45909 Unspecified asthma, uncomplicated: Secondary | ICD-10-CM | POA: Insufficient documentation

## 2021-09-06 DIAGNOSIS — R531 Weakness: Secondary | ICD-10-CM | POA: Diagnosis not present

## 2021-09-06 DIAGNOSIS — D72829 Elevated white blood cell count, unspecified: Secondary | ICD-10-CM | POA: Insufficient documentation

## 2021-09-06 DIAGNOSIS — D649 Anemia, unspecified: Secondary | ICD-10-CM | POA: Diagnosis not present

## 2021-09-06 DIAGNOSIS — R0600 Dyspnea, unspecified: Secondary | ICD-10-CM | POA: Diagnosis not present

## 2021-09-06 LAB — CBC
HCT: 33.9 % — ABNORMAL LOW (ref 39.0–52.0)
Hemoglobin: 10.8 g/dL — ABNORMAL LOW (ref 13.0–17.0)
MCH: 28.2 pg (ref 26.0–34.0)
MCHC: 31.9 g/dL (ref 30.0–36.0)
MCV: 88.5 fL (ref 80.0–100.0)
Platelets: 385 10*3/uL (ref 150–400)
RBC: 3.83 MIL/uL — ABNORMAL LOW (ref 4.22–5.81)
RDW: 13.4 % (ref 11.5–15.5)
WBC: 12.7 10*3/uL — ABNORMAL HIGH (ref 4.0–10.5)
nRBC: 0 % (ref 0.0–0.2)

## 2021-09-06 LAB — URINALYSIS, ROUTINE W REFLEX MICROSCOPIC
Bilirubin Urine: NEGATIVE
Glucose, UA: NEGATIVE mg/dL
Hgb urine dipstick: NEGATIVE
Ketones, ur: NEGATIVE mg/dL
Leukocytes,Ua: NEGATIVE
Nitrite: NEGATIVE
Protein, ur: NEGATIVE mg/dL
Specific Gravity, Urine: 1.013 (ref 1.005–1.030)
pH: 6 (ref 5.0–8.0)

## 2021-09-06 MED ORDER — FUROSEMIDE 20 MG PO TABS
40.0000 mg | ORAL_TABLET | Freq: Once | ORAL | Status: AC
  Administered 2021-09-06: 40 mg via ORAL
  Filled 2021-09-06: qty 2

## 2021-09-06 NOTE — ED Provider Notes (Signed)
[place] EMERGENCY DEPARTMENT Provider Note   CSN: [id number] Arrival date & time: 09/06/21  1848     History  Chief Complaint  Patient presents with   Weakness    Brian Silva is a 81 y.o. male  With history of obesity, diastolic heart failure, fall with secondary compression fracture of the spine, type 2 diabetes who was admitted to rehab for weakness, but unfortunately made the decision to leave yesterday against their advice.  He returns to the ED today stating that he feels he is not strong enough to live alone at home and would like to return to the facility he was previously at.  He states that prior to his discharge from rehab he was able to walk 85 steps with a walker and feels he can ambulate at home similarly though he does endorse shortness of breath towards the end of this distance.  States he was able to ambulate to and from the restroom independently though he felt he was breathing heavily upon returned to his couch.  He denies any new symptoms but states he has not had his medications for 2 days as he did not have any transportation to pick them up from the pharmacy.  Denies any new symptoms at this time; endorses baseline DOE.  Last meal 4 hours ago.   In addition to the above listed history patient has history of hypertension, asthma chronic back pain secondary to injury stated above, and obstructive sleep apnea.  He is not anticoagulated but he is supposed to be on Lasix 40 mg twice a day.   HPI     Home Medications Prior to Admission medications   Medication Sig Start Date End Date Taking? Authorizing Provider  acetaminophen (TYLENOL) 500 MG tablet Take 2 tablets (1,000 mg total) by mouth every 8 (eight) hours. Patient taking differently: Take 1,000 mg by mouth every 8 (eight) hours as needed for mild pain. 09/14/19   Nita Sells, MD  albuterol (PROVENTIL HFA;VENTOLIN HFA) 108 (90 Base) MCG/ACT inhaler Inhale 2 puffs into the lungs  every 6 (six) hours as needed. For shortness of breath 10/28/15   Theodis Blaze, MD  amLODipine (NORVASC) 10 MG tablet Take 10 mg by mouth daily.    [provider]  aspirin EC 81 MG tablet Take 81 mg by mouth daily.    [provider]  atorvastatin (LIPITOR) 20 MG tablet Take 20 mg by mouth at bedtime.    [provider]  benzocaine-menthol (CHLORAEPTIC) 6-10 MG lozenge Take 1 lozenge by mouth every 2 (two) hours as needed for sore throat.    [provider]  Cholecalciferol (VITAMIN D3) 125 MCG (5000 UT) CAPS Take 5,000 Units by mouth daily. 06/07/20   [provider]  docusate sodium (COLACE) 100 MG capsule Take 100 mg by mouth 2 (two) times daily as needed (for constipation).    [provider]  DULoxetine (CYMBALTA) 20 MG capsule Take 20 mg by mouth daily.    [provider]  folic acid (FOLVITE) 1 MG tablet Take 1 tablet (1 mg total) by mouth daily. 07/11/20   Bonnielee Haff, MD  furosemide (LASIX) 40 MG tablet Take 40 mg by mouth 2 (two) times daily.    [provider]  guaiFENesin (MUCINEX) 600 MG 12 hr tablet Take 600 mg by mouth 2 (two) times daily as needed for cough.    [provider]  [employer] [facility] EX) Apply 1 application topically in  the morning and at bedtime. buttocks    [provider]  ipratropium-albuterol (DUONEB) 0.5-2.5 (3) MG/3ML SOLN Take 3 mLs by nebulization every 6 (six) hours as needed for shortness of breath or wheezing. 07/04/20   [provider]  Menthol, Topical Analgesic, (BIOFREEZE) 4 % GEL Apply 1 application topically every 6 (six) hours as needed (joint pain).    [provider]  nystatin (MYCOSTATIN/NYSTOP) powder Apply 1 application topically See admin instructions. Apply to abdominal folds twice daily for redness or rash    [provider]  ondansetron (ZOFRAN) 4 MG tablet Take 4 mg by mouth every 8 (eight) hours as needed for  nausea or vomiting.    [provider]  pantoprazole (PROTONIX) 20 MG tablet Take 20 mg by mouth daily. 06/30/20   [provider]  polyethylene glycol (MIRALAX / GLYCOLAX) 17 g packet Take 17 g by mouth daily as needed for mild constipation.    [provider]  pregabalin (LYRICA) 75 MG capsule Take 75 mg by mouth 3 (three) times daily.    [provider]  Saccharomyces boulardii (PROBIOTIC) 250 MG CAPS Take 1 capsule by mouth 2 (two) times daily. 06/22/20   [provider]  traMADol (ULTRAM) 50 MG tablet Take 1 tablet (50 mg total) by mouth every 8 (eight) hours as needed for moderate pain. 07/11/20   Bonnielee Haff, MD  vitamin B-12 (CYANOCOBALAMIN) 500 MCG tablet Take 1 tablet (500 mcg total) by mouth daily. 07/11/20   Bonnielee Haff, MD      Allergies    Patient has no known allergies.    Review of Systems   Review of Systems  Constitutional:  Positive for fatigue. Negative for activity change and appetite change.  Neurological:  Positive for weakness.   Physical Exam Updated Vital Signs BP (!) 151/58 (BP Location: Right Arm)   Pulse 83   Temp 97.7 F (36.5 C)   Resp 18   SpO2 93%  Physical Exam Vitals and nursing note reviewed.  Constitutional:      Appearance: He is morbidly obese. He is not ill-appearing or toxic-appearing.  HENT:     Head: Normocephalic and atraumatic.     Nose: Nose normal.     Mouth/Throat:     Mouth: Mucous membranes are moist.     Pharynx: No oropharyngeal exudate or posterior oropharyngeal erythema.  Eyes:     General:        Right eye: No discharge.        Left eye: No discharge.     Conjunctiva/sclera: Conjunctivae normal.  Cardiovascular:     Rate and Rhythm: Normal rate and regular rhythm.     Pulses: Normal pulses.     Heart sounds: Normal heart sounds. No murmur heard. Pulmonary:     Effort: Pulmonary effort is normal. No tachypnea, accessory muscle usage, prolonged expiration, respiratory  distress or retractions.     Breath sounds: Normal breath sounds. No wheezing or rales.  Chest:     Chest wall: No mass, lacerations, deformity, swelling, tenderness, crepitus or edema.  Abdominal:     General: Bowel sounds are normal. There is no distension.     Palpations: Abdomen is soft.     Tenderness: There is no abdominal tenderness. There is no guarding or rebound.  Musculoskeletal:        General: No deformity.     Cervical back: Neck supple.     Right lower leg: 2+ Edema present.  Left lower leg: 2+ Edema present.  Skin:    General: Skin is warm and dry.     Capillary Refill: Capillary refill takes less than 2 seconds.       Neurological:     Mental Status: He is alert. Mental status is at baseline.  Psychiatric:        Mood and Affect: Mood normal.    ED Results / Procedures / Treatments   Labs (all labs ordered are listed, but only abnormal results are displayed) Labs Reviewed  BASIC METABOLIC PANEL  CBC  URINALYSIS, ROUTINE W REFLEX MICROSCOPIC  CBG MONITORING, ED    EKG EKG Interpretation  Date/Time:  Wednesday Sep 06 2021 22:17:46 EDT Ventricular Rate:  85 PR Interval:  214 QRS Duration: 76 QT Interval:  356 QTC Calculation: 423 R Axis:   -11 Text Interpretation: Sinus rhythm with 1st degree A-V block Nonspecific T wave abnormality Confirmed by Lajean Saver [phone]) on 09/06/2021 10:25:40 PM  Radiology No results found.  Procedures Procedures    Medications Ordered in ED Medications - No data to display  ED Course/ Medical Decision Making/ A&P Clinical Course as of 09/07/21 0145  Wed Sep 06, 2021  2250 Consult to Toivo, Laurena Slimmer, who states patient has left facility prior to recommended discharge date multiple times. Unfortunately we will not be able to place him in rehab again at this time. He does have established home health care to start this week, though he does not know what day.  [RS]    Clinical Course User Index [RS]  Jalayne Ganesh, Gypsy Balsam, PA-C                           Medical Decision Making 81 year old male who presents with concern for his baseline weakness feeling uncomfortable being home by himself at this time.  Hypertensive on intake, vital signs otherwise normal.  Cardiopulmonary abdominal exams are benign.  Patient with bilateral lower extremity pitting edema, Per patient at his baseline.  Patient appears anxious.  Amount and/or Complexity of Data Reviewed Labs:     Details: CBC with mild leukocytosis and anemia near patient's baseline at 10.8.  Creatinine at patient's baseline at 1.8 as well, no other metabolic derangement on BMP.  UA without evidence of infection Radiology: ordered and independent interpretation performed.    Details: Chest x-ray negative for pleural effusions or significant vascular congestion. ECG/medicine tests: independent interpretation performed.    Details: EKG with normal sinus rhythm with first-degree AV block but no STEMI.  Risk Prescription drug management.   .    Patient was able to ambulate a few steps in the emergency department with a walker without difficulty, however at that point became agitated, verbally aggressive with staff, and refused to walk anymore.  Patient did not have any falls and has repeatedly endorsed that he was able to walk 85 feet independently with a walker at his facility yesterday without difficulty.  He states that he is not feeling more weak today just is concerned that he may fall.  As per above, patient not a candidate for repeat placement to rehab.  Has home health established.  Given no acute medical changes or concerns on work-up today and ability to walk in the emergency department, no further work-up is warranted in the ER at this time.  [place] is safe to be discharged home given his ability to ambulate, and his endorsement of completion of ADLs to and from  the restroom and able to secure himself food today.  Recommend close  outpatient follow-up with his PCP.  Dimitrios  voiced understanding of his medical evaluation and treatment plan. Each of their questions answered to their expressed satisfaction.  Return precautions were given.  Patient is well-appearing, stable, and was discharged in good condition.  This chart was dictated using voice recognition software, Dragon. Despite the best efforts of this provider to proofread and correct errors, errors may still occur which can change documentation meaning.  Final Clinical Impression(s) / ED Diagnoses Final diagnoses:  None    Rx / DC Orders ED Discharge Orders     None         Emeline Darling, PA-C 09/07/21 0148    Maudie Flakes, MD 09/07/21 [phone]

## 2021-09-06 NOTE — ED Notes (Signed)
Patient to imaging.

## 2021-09-06 NOTE — ED Notes (Signed)
Urine culture sent down with u/a 

## 2021-09-06 NOTE — ED Triage Notes (Signed)
Pt from home via [facility], just discharged yesterday from [facility] for rehab after spinal injury, weakness & rapid decline since last night arrival home, anxiety & SHOB. Denies injury ? ?180/100, no meds in 2 days  ?A&O,at baseline ?

## 2021-09-06 NOTE — ED Notes (Signed)
Patient speaking in complete sentences, requesting a room with a TV.  Patient informed we did not have a room at this time for him to watch TV.  Patient seems to be ok with this answer.  ?

## 2021-09-06 NOTE — ED Provider Triage Note (Addendum)
Emergency Medicine Provider Triage Evaluation Note ? ?Brian Silva , a 81 y.o. male  was evaluated in triage.  Pt complains of weakness. Hx of back issues. 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 ? ?Left [facility] yesterday but states he can't be at home because of weakness.  ? ?He is asking if he can go back to the rehab facility he was just at ? ?Review of Systems  ?Positive: Weakness  ?Negative: Fever  ? ?Physical Exam  ?BP (!) 169/83 (BP Location: Right Arm)   Pulse 97   Temp 97.7 ?F (36.5 ?C)   Resp 18   SpO2 95%  ?Gen:   Awake, no distress  ?Resp:  Normal effort  ?MSK:   Moves extremities without difficulty  ?Other:  Abd soft NTTP ? ?Medical Decision Making  ?Medically screening exam initiated at 6:56 PM.  Appropriate orders placed.  Bethanie Dicker Townley was informed that the remainder of the evaluation will be completed by another provider, this initial triage assessment does not replace that evaluation, and the importance of remaining in the ED until their evaluation is complete. ? ?Labs, ekg ?  ?Tedd Sias, [place] ?09/06/21 1858 ? ?  ?Tedd Sias, [place] ?09/06/21 1859 ? ?

## 2021-09-07 DIAGNOSIS — R531 Weakness: Secondary | ICD-10-CM | POA: Diagnosis not present

## 2021-09-07 DIAGNOSIS — R262 Difficulty in walking, not elsewhere classified: Secondary | ICD-10-CM | POA: Diagnosis not present

## 2021-09-07 DIAGNOSIS — Z743 Need for continuous supervision: Secondary | ICD-10-CM | POA: Diagnosis not present

## 2021-09-07 LAB — BASIC METABOLIC PANEL
Anion gap: 9 (ref 5–15)
BUN: 30 mg/dL — ABNORMAL HIGH (ref 8–23)
CO2: 23 mmol/L (ref 22–32)
Calcium: 9.1 mg/dL (ref 8.9–10.3)
Chloride: 106 mmol/L (ref 98–111)
Creatinine, Ser: 1.83 mg/dL — ABNORMAL HIGH (ref 0.61–1.24)
GFR, Estimated: 37 mL/min — ABNORMAL LOW (ref >60)
Glucose, Bld: 155 mg/dL — ABNORMAL HIGH (ref 70–99)
Potassium: 3.7 mmol/L (ref 3.5–5.1)
Sodium: 138 mmol/L (ref 135–145)

## 2021-09-07 MED ORDER — HYDROXYZINE HCL 25 MG PO TABS
25.0000 mg | ORAL_TABLET | Freq: Once | ORAL | Status: AC
  Administered 2021-09-07: 25 mg via ORAL
  Filled 2021-09-07: qty 1

## 2021-09-07 NOTE — Discharge Instructions (Signed)
Please follow-up with your primary care doctor.  Your work-up today was reassuring.  Please continue take your medications as prescribed.  Return to the ER with any new severe symptoms

## 2021-09-07 NOTE — ED Notes (Signed)
Patient ambulated well, taking 3 steps with a walker.  Patient then began having a panic attack and became verbally abusive to staff.  Patient took 2 puffs of his albuterol inhaler which seemed to immediately calm patient down.  Patient apologized for being verbally abusive to staff and then requested something to take for a panic attack.  PA made aware of all of this.

## 2021-09-07 NOTE — ED Notes (Signed)
[facility] contacted for transport 

## 2021-09-09 ENCOUNTER — Emergency Department (HOSPITAL_COMMUNITY): Payer: Medicare Other

## 2021-09-09 ENCOUNTER — Observation Stay (HOSPITAL_COMMUNITY)
Admission: EM | Admit: 2021-09-09 | Discharge: 2021-09-12 | Disposition: A | Discharge status: Skilled Nursing Facility | Payer: Medicare Other | Attending: Internal Medicine | Admitting: Internal Medicine

## 2021-09-09 DIAGNOSIS — N182 Chronic kidney disease, stage 2 (mild): Secondary | ICD-10-CM | POA: Insufficient documentation

## 2021-09-09 DIAGNOSIS — J45909 Unspecified asthma, uncomplicated: Secondary | ICD-10-CM | POA: Insufficient documentation

## 2021-09-09 DIAGNOSIS — E119 Type 2 diabetes mellitus without complications: Secondary | ICD-10-CM | POA: Insufficient documentation

## 2021-09-09 DIAGNOSIS — M5412 Radiculopathy, cervical region: Secondary | ICD-10-CM | POA: Diagnosis not present

## 2021-09-09 DIAGNOSIS — M542 Cervicalgia: Secondary | ICD-10-CM | POA: Diagnosis not present

## 2021-09-09 DIAGNOSIS — E1169 Type 2 diabetes mellitus with other specified complication: Secondary | ICD-10-CM

## 2021-09-09 DIAGNOSIS — I5032 Chronic diastolic (congestive) heart failure: Secondary | ICD-10-CM | POA: Insufficient documentation

## 2021-09-09 DIAGNOSIS — Z7982 Long term (current) use of aspirin: Secondary | ICD-10-CM | POA: Diagnosis not present

## 2021-09-09 DIAGNOSIS — I130 Hypertensive heart and chronic kidney disease with heart failure and stage 1 through stage 4 chronic kidney disease, or unspecified chronic kidney disease: Secondary | ICD-10-CM | POA: Insufficient documentation

## 2021-09-09 DIAGNOSIS — G4733 Obstructive sleep apnea (adult) (pediatric): Secondary | ICD-10-CM | POA: Insufficient documentation

## 2021-09-09 DIAGNOSIS — R627 Adult failure to thrive: Secondary | ICD-10-CM | POA: Diagnosis present

## 2021-09-09 DIAGNOSIS — E66813 Obesity, class 3: Secondary | ICD-10-CM | POA: Diagnosis present

## 2021-09-09 DIAGNOSIS — R531 Weakness: Secondary | ICD-10-CM | POA: Diagnosis not present

## 2021-09-09 DIAGNOSIS — Z79899 Other long term (current) drug therapy: Secondary | ICD-10-CM | POA: Insufficient documentation

## 2021-09-09 DIAGNOSIS — Z23 Encounter for immunization: Secondary | ICD-10-CM | POA: Insufficient documentation

## 2021-09-09 DIAGNOSIS — R200 Anesthesia of skin: Secondary | ICD-10-CM | POA: Diagnosis not present

## 2021-09-09 DIAGNOSIS — I10 Essential (primary) hypertension: Secondary | ICD-10-CM | POA: Diagnosis not present

## 2021-09-09 DIAGNOSIS — I959 Hypotension, unspecified: Secondary | ICD-10-CM | POA: Diagnosis not present

## 2021-09-09 DIAGNOSIS — R519 Headache, unspecified: Secondary | ICD-10-CM | POA: Diagnosis not present

## 2021-09-09 DIAGNOSIS — J189 Pneumonia, unspecified organism: Secondary | ICD-10-CM | POA: Diagnosis not present

## 2021-09-09 DIAGNOSIS — Z20822 Contact with and (suspected) exposure to covid-19: Secondary | ICD-10-CM | POA: Insufficient documentation

## 2021-09-09 DIAGNOSIS — R202 Paresthesia of skin: Secondary | ICD-10-CM | POA: Diagnosis not present

## 2021-09-09 DIAGNOSIS — R4182 Altered mental status, unspecified: Secondary | ICD-10-CM | POA: Diagnosis not present

## 2021-09-09 LAB — CBC WITH DIFFERENTIAL/PLATELET
Abs Immature Granulocytes: 0.03 10*3/uL (ref 0.00–0.07)
Basophils Absolute: 0.1 10*3/uL (ref 0.0–0.1)
Basophils Relative: 1 %
Eosinophils Absolute: 0.1 10*3/uL (ref 0.0–0.5)
Eosinophils Relative: 1 %
HCT: 27.1 % — ABNORMAL LOW (ref 39.0–52.0)
Hemoglobin: 8.8 g/dL — ABNORMAL LOW (ref 13.0–17.0)
Immature Granulocytes: 0 %
Lymphocytes Relative: 8 %
Lymphs Abs: 0.9 10*3/uL (ref 0.7–4.0)
MCH: 28.7 pg (ref 26.0–34.0)
MCHC: 32.5 g/dL (ref 30.0–36.0)
MCV: 88.3 fL (ref 80.0–100.0)
Monocytes Absolute: 0.7 10*3/uL (ref 0.1–1.0)
Monocytes Relative: 7 %
Neutro Abs: 9 10*3/uL — ABNORMAL HIGH (ref 1.7–7.7)
Neutrophils Relative %: 83 %
Platelets: 307 10*3/uL (ref 150–400)
RBC: 3.07 MIL/uL — ABNORMAL LOW (ref 4.22–5.81)
RDW: 13.4 % (ref 11.5–15.5)
WBC: 10.8 10*3/uL — ABNORMAL HIGH (ref 4.0–10.5)
nRBC: 0 % (ref 0.0–0.2)

## 2021-09-09 LAB — COMPREHENSIVE METABOLIC PANEL
ALT: 18 U/L (ref 0–44)
AST: 21 U/L (ref 15–41)
Albumin: 3.9 g/dL (ref 3.5–5.0)
Alkaline Phosphatase: 132 U/L — ABNORMAL HIGH (ref 38–126)
Anion gap: 12 (ref 5–15)
BUN: 21 mg/dL (ref 8–23)
CO2: 26 mmol/L (ref 22–32)
Calcium: 9.4 mg/dL (ref 8.9–10.3)
Chloride: 104 mmol/L (ref 98–111)
Creatinine, Ser: 1.78 mg/dL — ABNORMAL HIGH (ref 0.61–1.24)
GFR, Estimated: 39 mL/min — ABNORMAL LOW (ref >60)
Glucose, Bld: 149 mg/dL — ABNORMAL HIGH (ref 70–99)
Potassium: 3.8 mmol/L (ref 3.5–5.1)
Sodium: 142 mmol/L (ref 135–145)
Total Bilirubin: 0.8 mg/dL (ref 0.3–1.2)
Total Protein: 8.5 g/dL — ABNORMAL HIGH (ref 6.5–8.1)

## 2021-09-09 LAB — URINALYSIS, ROUTINE W REFLEX MICROSCOPIC
Bilirubin Urine: NEGATIVE
Glucose, UA: NEGATIVE mg/dL
Hgb urine dipstick: NEGATIVE
Ketones, ur: NEGATIVE mg/dL
Leukocytes,Ua: NEGATIVE
Nitrite: NEGATIVE
Protein, ur: NEGATIVE mg/dL
Specific Gravity, Urine: 1.009 (ref 1.005–1.030)
pH: 6 (ref 5.0–8.0)

## 2021-09-09 LAB — LACTIC ACID, PLASMA
Lactic Acid, Venous: 1.1 mmol/L (ref 0.5–1.9)
Lactic Acid, Venous: 2 mmol/L (ref 0.5–1.9)

## 2021-09-09 LAB — RESP PANEL BY RT-PCR (FLU A&B, COVID) ARPGX2
Influenza A by PCR: NEGATIVE
Influenza B by PCR: NEGATIVE
SARS Coronavirus 2 by RT PCR: NEGATIVE

## 2021-09-09 LAB — CK: Total CK: 76 U/L (ref 49–397)

## 2021-09-09 MED ORDER — PNEUMOCOCCAL 20-VAL CONJ VACC 0.5 ML IM SUSY
0.5000 mL | PREFILLED_SYRINGE | INTRAMUSCULAR | Status: AC
  Administered 2021-09-10: 0.5 mL via INTRAMUSCULAR
  Filled 2021-09-09: qty 0.5

## 2021-09-09 MED ORDER — LACTATED RINGERS IV SOLN: INTRAVENOUS | Status: AC

## 2021-09-09 MED ORDER — SODIUM CHLORIDE 0.9 % IV SOLN
1.0000 g | Freq: Once | INTRAVENOUS | Status: AC
  Administered 2021-09-09: 1 g via INTRAVENOUS
  Filled 2021-09-09: qty 10

## 2021-09-09 MED ORDER — SODIUM CHLORIDE 0.9 % IV SOLN
500.0000 mg | Freq: Once | INTRAVENOUS | Status: AC
  Administered 2021-09-09: 500 mg via INTRAVENOUS
  Filled 2021-09-09: qty 5

## 2021-09-09 MED ORDER — SODIUM CHLORIDE 0.9 % IV BOLUS
1000.0000 mL | Freq: Once | INTRAVENOUS | Status: AC
  Administered 2021-09-09: 1000 mL via INTRAVENOUS

## 2021-09-09 NOTE — Progress Notes (Signed)
Critical lab value : Lactic acid 2.0  Inform Hal Hope on Call MD

## 2021-09-09 NOTE — ED Triage Notes (Signed)
Pt brought in by [facility] from home . Pt is reporting finger tingling that has been ongoing since his fall two years ago. Pt was covered in stool upon ems arrival. Pt reports not being able to care for himself anymore.

## 2021-09-09 NOTE — ED Provider Notes (Signed)
[name]   CSN: [id number] Arrival date & time: 09/09/21  1441     History  Chief Complaint  Patient presents with   Failure To [place] is a 81 y.o. male hx of HTN, obesity, here presenting with falls, unable to take care of himself.  Patient has been having recurrent falls.  Patient was in rehab until 2 days ago.  He was supposed to stay in rehab longer but apparently signed himself out of the facility.  He came in 2 days ago because he was unable to take care of himself.  His labs were unremarkable and case management was contacted and he did not meet criteria for rehab anymore.  He was able to ambulate briefly in the ED.  Patient states that since then he has been in bed and unable to take care of himself.  He states that he is covered in feces and also admits to bilateral finger tingling.  He states that the tingling has been going on for the last 2 years.  Denies any additional falls but he states that he was unable to get up.  The history is provided by the patient.      Home Medications Prior to Admission medications   Medication Sig Start Date End Date Taking? Authorizing Provider  acetaminophen (TYLENOL) 500 MG tablet Take 2 tablets (1,000 mg total) by mouth every 8 (eight) hours. Patient taking differently: Take 1,000 mg by mouth every 8 (eight) hours as needed for mild pain. 09/14/19   Nita Sells, MD  albuterol (PROVENTIL HFA;VENTOLIN HFA) 108 (90 Base) MCG/ACT inhaler Inhale 2 puffs into the lungs every 6 (six) hours as needed. For shortness of breath 10/28/15   Theodis Blaze, MD  amLODipine (NORVASC) 10 MG tablet Take 10 mg by mouth daily.    [provider]  aspirin EC 81 MG tablet Take 81 mg by mouth daily.    [provider]  atorvastatin (LIPITOR) 20 MG tablet Take 20 mg by mouth at bedtime.    [provider]  benzocaine-menthol (CHLORAEPTIC) 6-10 MG lozenge Take 1  lozenge by mouth every 2 (two) hours as needed for sore throat.    [provider]  Cholecalciferol (VITAMIN D3) 125 MCG (5000 UT) CAPS Take 5,000 Units by mouth daily. 06/07/20   [provider]  docusate sodium (COLACE) 100 MG capsule Take 100 mg by mouth 2 (two) times daily as needed (for constipation).    [provider]  DULoxetine (CYMBALTA) 20 MG capsule Take 20 mg by mouth daily.    [provider]  folic acid (FOLVITE) 1 MG tablet Take 1 tablet (1 mg total) by mouth daily. 07/11/20   Bonnielee Haff, MD  furosemide (LASIX) 40 MG tablet Take 40 mg by mouth 2 (two) times daily.    [provider]  guaiFENesin (MUCINEX) 600 MG 12 hr tablet Take 600 mg by mouth 2 (two) times daily as needed for cough.    [provider]  Infant Care Products [facility] EX) Apply 1 application topically in the morning and at bedtime. buttocks    [provider]  ipratropium-albuterol (DUONEB) 0.5-2.5 (3) MG/3ML SOLN Take 3 mLs by nebulization every 6 (six) hours as needed for shortness of breath or wheezing. 07/04/20   [provider]  Menthol, Topical Analgesic, (BIOFREEZE) 4 % GEL Apply 1 application topically every 6 (six) hours as needed (joint pain).    [provider]  nystatin (MYCOSTATIN/NYSTOP) powder Apply 1 application topically See admin instructions. Apply to abdominal folds twice daily for redness or rash    [provider]  ondansetron (ZOFRAN) 4 MG tablet Take 4 mg by mouth every 8 (eight) hours as needed for nausea or vomiting.    [provider]  pantoprazole (PROTONIX) 20 MG tablet Take 20 mg by mouth daily. 06/30/20   [provider]  polyethylene glycol (MIRALAX / GLYCOLAX) 17 g packet Take 17 g by mouth daily as needed for mild constipation.    [provider]  pregabalin (LYRICA) 75 MG capsule Take 75 mg by mouth 3 (three) times daily.    [provider]  Saccharomyces  boulardii (PROBIOTIC) 250 MG CAPS Take 1 capsule by mouth 2 (two) times daily. 06/22/20   [provider]  traMADol (ULTRAM) 50 MG tablet Take 1 tablet (50 mg total) by mouth every 8 (eight) hours as needed for moderate pain. 07/11/20   Bonnielee Haff, MD  vitamin B-12 (CYANOCOBALAMIN) 500 MCG tablet Take 1 tablet (500 mcg total) by mouth daily. 07/11/20   Bonnielee Haff, MD      Allergies    Patient has no known allergies.    Review of Systems   Review of Systems  Neurological:  Positive for numbness.  All other systems reviewed and are negative.  Physical Exam Updated Vital Signs BP (!) 150/66   Pulse 89   Temp 99.1 F (37.3 C) (Oral)   Resp 18   SpO2 96%  Physical Exam Vitals and nursing note reviewed.  Constitutional:      Comments: Chronically ill, dehydrated  HENT:     Head: Normocephalic.     Nose: Nose normal.     Mouth/Throat:     Mouth: Mucous membranes are dry.  Eyes:     Extraocular Movements: Extraocular movements intact.     Pupils: Pupils are equal, round, and reactive to light.  Cardiovascular:     Rate and Rhythm: Normal rate and regular rhythm.     Pulses: Normal pulses.     Heart sounds: Normal heart sounds.  Pulmonary:     Effort: Pulmonary effort is normal.     Breath sounds: Normal breath sounds.  Abdominal:     General: Abdomen is flat.     Palpations: Abdomen is soft.  Musculoskeletal:        General: Normal range of motion.     Cervical back: Normal range of motion and neck supple.  Skin:    General: Skin is warm.     Capillary Refill: Capillary refill takes less than 2 seconds.  Neurological:     Comments: Decreased sensation fourth and fifth finger bilateral hands.  Patient has normal motor strength upper extremity.  Patient has difficulty getting up from the stretcher to ambulate  Psychiatric:        Mood and Affect: Mood normal.    ED Results / Procedures / Treatments   Labs (all labs ordered are listed, but only abnormal  results are displayed) Labs Reviewed  CBC WITH DIFFERENTIAL/PLATELET  COMPREHENSIVE METABOLIC PANEL  URINALYSIS, ROUTINE W REFLEX MICROSCOPIC  CK    EKG None  Radiology No results found.  Procedures Procedures    Angiocath insertion Performed by: Brian Silva  Consent: Verbal consent obtained. Risks and benefits: risks, benefits and alternatives were discussed Time out: Immediately prior to procedure a "time out" was called to verify the correct patient, procedure, equipment, support staff and  site/side marked as required.  Preparation: Patient was prepped and draped in the usual sterile fashion.  Vein Location: L antecube  Ultrasound Guided  Gauge: 20 long   Normal blood return and flush without difficulty Patient tolerance: Patient tolerated the procedure well with no immediate complications.     Medications Ordered in ED Medications - No data to display  ED Course/ Medical Decision Making/ A&P                           Medical Decision Making Brian Silva is a 81 y.o. male here presenting with bilateral arm numbness and inability to take care of himself.  Patient has been in bed for the last 2 days and unable to get up.  He is covered in feces.  He has bilateral arm numbness which is chronic.  I am concerned for possible rhabdomyolysis versus acute renal failure versus UTI versus pneumonia.  Plan to get CBC and CMP and CK level and UA and chest x-ray and CT head and CT cervical spine.  6:50 PM WBC 11.  Patient has pneumonia on chest x-ray.  CK level is normal.  Given Rocephin and azithromycin.  Lactate and cultures sent.  Hospitalist to admit for pneumonia.  I have consulted case management and physical therapy as well.  Problems Addressed: Community acquired pneumonia of right lower lobe of lung: acute illness or injury  Amount and/or Complexity of Data Reviewed Labs: ordered. Decision-making details documented in ED Course. Radiology: ordered and  independent interpretation performed. Decision-making details documented in ED Course. ECG/medicine tests: ordered and independent interpretation performed. Decision-making details documented in ED Course.  Risk Prescription drug management. Decision regarding hospitalization.    Final Clinical Impression(s) / ED Diagnoses Final diagnoses:  None    Rx / DC Orders ED Discharge Orders     None         Drenda Freeze, MD 09/09/21 [phone]

## 2021-09-09 NOTE — Care Management (Signed)
Patient  presents to the [facility] ED  and states he is unable to care for self. PT consult in for evaluation, Patient is a Medicare  A/B with [facility]. May meet waiver requirement, CSW will evaluate in AM in conjunction with PT recommendations.

## 2021-09-09 NOTE — H&P (Signed)
History and Physical    Patient: Brian Silva UKG:[id number] DOB: 1940-09-17 DOA: 09/09/2021 DOS: the patient was seen and examined on 09/10/2021 PCP: Benito Mccreedy, MD  Patient coming from: Home  Chief Complaint:  Chief Complaint  Patient presents with   Failure To Thrive   HPI: Brian Silva is a 81 y.o. male with medical history significant of hypertension HFpEF, OSA, type 2 diabetes, CKD stage II, morbid obesity, hyperlipidemia who presents with worsening weakness and inability to care for himself.  after he left AMA from rehab on 09/05/21.  He was evaluated in the ED back on 07/24/2021 with cervical radiculopathy with paresthesia to fourth and fifth digit of bilateral hand.  Had CT cervical findings of multilevel cervical canal stenosis and disc degeneration contributing to his symptoms.  Due to unsafe discharge and complex living situation he was boarded in the ED and ultimately placed into rehab.  He left AMA from rehab 2 days ago on 5/16 and was evaluated on 5/17 for increasing weakness and inability to care for himself at home and requested to return to the facility.  However, patient was able to ambulate in the ED and as a result was not a candidate for repeat placement to rehab and had home health established.    He presents today with complaint of worsening weakness.  Stating he was unable to get up to walk to the bathroom.  Has just been urinating in a urinal.  Has not been able to get up to cook his own meals.  Reportedly was brought into the ED covered in feces.  Denies any coughing, had some mild midsternal chest pain in the ER that has since resolved.  Mild shortness of breath.  He had work-up in the ED revealing mild leukocytosis of 10.8 with lactic acid of 2.  Chest x-ray was concerning for right lower lobe patchy infiltrate.  He was started on IV Rocephin and azithromycin and hospitalist was called for admission.  Review of Systems: As mentioned in the history of  present illness. All other systems reviewed and are negative. Past Medical History:  Diagnosis Date   Asthma    Chronic respiratory failure ([place])    on 2L oxygen   Hyperlipidemia    Hypertension    Obese    OSA (obstructive sleep apnea)    Sleep study 08/2010 showed mild to moderate obstructive sleep apnea/hypopnea syndrome. However, never initiated CPAP   Venous stasis    Venous Statis Disease (03/28/2004)   No past surgical history on file. Social History:  reports that he has never smoked. He has never used smokeless tobacco. He reports current alcohol use. He reports that he does not use drugs.  No Known Allergies  No family history on file.  Prior to Admission medications   Medication Sig Start Date End Date Taking? Authorizing Provider  acetaminophen (TYLENOL) 500 MG tablet Take 2 tablets (1,000 mg total) by mouth every 8 (eight) hours. Patient taking differently: Take 1,000 mg by mouth every 8 (eight) hours as needed for mild pain. 09/14/19   Nita Sells, MD  albuterol (PROVENTIL HFA;VENTOLIN HFA) 108 (90 Base) MCG/ACT inhaler Inhale 2 puffs into the lungs every 6 (six) hours as needed. For shortness of breath 10/28/15   Theodis Blaze, MD  amLODipine (NORVASC) 10 MG tablet Take 10 mg by mouth daily.    [provider]  aspirin EC 81 MG tablet Take 81 mg by mouth daily.    [provider]  atorvastatin (LIPITOR) 20 MG tablet Take 20 mg by mouth at bedtime.    [provider]  benzocaine-menthol (CHLORAEPTIC) 6-10 MG lozenge Take 1 lozenge by mouth every 2 (two) hours as needed for sore throat.    [provider]  Cholecalciferol (VITAMIN D3) 125 MCG (5000 UT) CAPS Take 5,000 Units by mouth daily. 06/07/20   [provider]  docusate sodium (COLACE) 100 MG capsule Take 100 mg by mouth 2 (two) times daily as needed (for constipation).    [provider]  DULoxetine (CYMBALTA) 20 MG capsule Take 20 mg by mouth daily.     [provider]  folic acid (FOLVITE) 1 MG tablet Take 1 tablet (1 mg total) by mouth daily. 07/11/20   Bonnielee Haff, MD  furosemide (LASIX) 40 MG tablet Take 40 mg by mouth 2 (two) times daily.    [provider]  guaiFENesin (MUCINEX) 600 MG 12 hr tablet Take 600 mg by mouth 2 (two) times daily as needed for cough.    [provider]  Infant Care Products [facility] EX) Apply 1 application topically in the morning and at bedtime. buttocks    [provider]  ipratropium-albuterol (DUONEB) 0.5-2.5 (3) MG/3ML SOLN Take 3 mLs by nebulization every 6 (six) hours as needed for shortness of breath or wheezing. 07/04/20   [provider]  Menthol, Topical Analgesic, (BIOFREEZE) 4 % GEL Apply 1 application topically every 6 (six) hours as needed (joint pain).    [provider]  nystatin (MYCOSTATIN/NYSTOP) powder Apply 1 application topically See admin instructions. Apply to abdominal folds twice daily for redness or rash    [provider]  ondansetron (ZOFRAN) 4 MG tablet Take 4 mg by mouth every 8 (eight) hours as needed for nausea or vomiting.    [provider]  pantoprazole (PROTONIX) 20 MG tablet Take 20 mg by mouth daily. 06/30/20   [provider]  polyethylene glycol (MIRALAX / GLYCOLAX) 17 g packet Take 17 g by mouth daily as needed for mild constipation.    [provider]  pregabalin (LYRICA) 75 MG capsule Take 75 mg by mouth 3 (three) times daily.    [provider]  Saccharomyces boulardii (PROBIOTIC) 250 MG CAPS Take 1 capsule by mouth 2 (two) times daily. 06/22/20   [provider]  traMADol (ULTRAM) 50 MG tablet Take 1 tablet (50 mg total) by mouth every 8 (eight) hours as needed for moderate pain. 07/11/20   Bonnielee Haff, MD  vitamin B-12 (CYANOCOBALAMIN) 500 MCG tablet Take 1 tablet (500 mcg total) by mouth daily. 07/11/20   Bonnielee Haff, MD    Physical Exam: Vitals:    09/09/21 1610 09/09/21 1729 09/09/21 2024 09/09/21 2347  BP:  101/70 (!) 162/75 (!) 152/62  Pulse:  82 89 79  Resp:  16 18 18   Temp: 99.8 F (37.7 C) 98.7 F (37.1 C) 98 F (36.7 C) 98.7 F (37.1 C)  TempSrc: Rectal Oral Oral Oral  SpO2:  100% 95% 95%   Constitutional: NAD, calm, comfortable, morbidly obese chronically ill-appearing male laying flat in bed Eyes: lids and conjunctivae normal ENMT: Mucous membranes are moist.  Neck: normal, supple,  Respiratory: clear to auscultation bilaterally, no wheezing, no crackles. Normal respiratory effort. No accessory muscle use.  Cardiovascular: Regular rate and rhythm, no murmurs / rubs / gallops.  +1 pitting edema of bilateral lower extremity up to mid pretibial region. Abdomen: no tenderness, no masses palpated. No hepatosplenomegaly. Bowel sounds positive.  Musculoskeletal: no clubbing / cyanosis. No joint deformity upper and lower extremities.  Normal muscle tone.  Skin: no rashes, lesions, ulcers. No induration Neurologic: CN 2-12 grossly intact.  Decreased sensation of the fourth and fifth digit of bilateral hands.  Weak bilateral handgrip with 4 out of 5 strength of the right upper extremity.  5 out of 5 strength lower extremity. psychiatric: Normal judgment and insight. Alert and oriented x 3. Normal mood. Data Reviewed:  EKG on my review showing first-degree AV block  Assessment and Plan: * Weakness  Patient previously in rehab but left AMA last week and since then has not been able to care for himself at home.  Reportedly presented covered in feces. -Multifactorial from community-acquired pneumonia, morbidly obese habitus, and chronic deconditioning.  -PT eval here. -Case management consult to assist with placement  Community acquired pneumonia Presented with mild leukocytosis, elevated lactate and chest x-ray concerning for right lower lobe patchy opacity -Continue IV Rocephin and azithromycin  Cervical  radiculopathy Patient with paresthesia of the fourth and fifth digit of bilateral hands.  This is chronic.  Repeat CT cervical spine shows multilevel canal stenosis and this degeneration contributing to his symptoms  Diabetes mellitus type II, non insulin dependent (HCC) Controlled.  Hold off on any SSI at this time  Essential hypertension Controlled.  Continue home antihypertensives pending med rec.  OBESITY, MORBID Contributory to his weakness      Advance Care Planning:   Code Status: Full Code   Consults: case management  Family Communication: No family   Severity of Illness: The appropriate patient status for this patient is OBSERVATION. Observation status is judged to be reasonable and necessary in order to provide the required intensity of service to ensure the patient's safety. The patient's presenting symptoms, physical exam findings, and initial radiographic and laboratory data in the context of their medical condition is felt to place them at decreased risk for further clinical deterioration. Furthermore, it is anticipated that the patient will be medically stable for discharge from the hospital within 2 midnights of admission.   Author: Orene Desanctis, DO 09/10/2021 1:35 AM  For on call review [email or web address].

## 2021-09-09 NOTE — Progress Notes (Signed)
Pt has arrived to the floor room 1503 A&O x4.

## 2021-09-10 ENCOUNTER — Encounter (HOSPITAL_COMMUNITY): Payer: Self-pay | Admitting: Family Medicine

## 2021-09-10 ENCOUNTER — Other Ambulatory Visit: Payer: Self-pay

## 2021-09-10 DIAGNOSIS — R531 Weakness: Secondary | ICD-10-CM

## 2021-09-10 DIAGNOSIS — J189 Pneumonia, unspecified organism: Secondary | ICD-10-CM | POA: Diagnosis not present

## 2021-09-10 DIAGNOSIS — I10 Essential (primary) hypertension: Secondary | ICD-10-CM | POA: Diagnosis not present

## 2021-09-10 DIAGNOSIS — M5412 Radiculopathy, cervical region: Secondary | ICD-10-CM

## 2021-09-10 DIAGNOSIS — E119 Type 2 diabetes mellitus without complications: Secondary | ICD-10-CM | POA: Diagnosis not present

## 2021-09-10 LAB — BASIC METABOLIC PANEL
Anion gap: 9 (ref 5–15)
BUN: 23 mg/dL (ref 8–23)
CO2: 26 mmol/L (ref 22–32)
Calcium: 8.5 mg/dL — ABNORMAL LOW (ref 8.9–10.3)
Chloride: 106 mmol/L (ref 98–111)
Creatinine, Ser: 1.87 mg/dL — ABNORMAL HIGH (ref 0.61–1.24)
GFR, Estimated: 36 mL/min — ABNORMAL LOW (ref >60)
Glucose, Bld: 134 mg/dL — ABNORMAL HIGH (ref 70–99)
Potassium: 3 mmol/L — ABNORMAL LOW (ref 3.5–5.1)
Sodium: 141 mmol/L (ref 135–145)

## 2021-09-10 LAB — LACTIC ACID, PLASMA
Lactic Acid, Venous: 0.9 mmol/L (ref 0.5–1.9)
Lactic Acid, Venous: 0.7 mmol/L (ref 0.5–1.9)

## 2021-09-10 LAB — CBC
HCT: 30.1 % — ABNORMAL LOW (ref 39.0–52.0)
Hemoglobin: 10 g/dL — ABNORMAL LOW (ref 13.0–17.0)
MCH: 28.7 pg (ref 26.0–34.0)
MCHC: 33.2 g/dL (ref 30.0–36.0)
MCV: 86.5 fL (ref 80.0–100.0)
Platelets: 340 10*3/uL (ref 150–400)
RBC: 3.48 MIL/uL — ABNORMAL LOW (ref 4.22–5.81)
RDW: 13.4 % (ref 11.5–15.5)
WBC: 11.6 10*3/uL — ABNORMAL HIGH (ref 4.0–10.5)
nRBC: 0 % (ref 0.0–0.2)

## 2021-09-10 MED ORDER — ASPIRIN 81 MG PO TBEC
81.0000 mg | DELAYED_RELEASE_TABLET | Freq: Every day | ORAL | Status: DC
  Administered 2021-09-10 – 2021-09-12 (×3): 81 mg via ORAL
  Filled 2021-09-10 (×3): qty 1

## 2021-09-10 MED ORDER — CEFDINIR 300 MG PO CAPS
300.0000 mg | ORAL_CAPSULE | Freq: Two times a day (BID) | ORAL | Status: DC
  Administered 2021-09-10 – 2021-09-12 (×6): 300 mg via ORAL
  Filled 2021-09-10 (×6): qty 1

## 2021-09-10 MED ORDER — ATORVASTATIN CALCIUM 10 MG PO TABS
20.0000 mg | ORAL_TABLET | Freq: Every day | ORAL | Status: DC
  Administered 2021-09-10 – 2021-09-12 (×3): 20 mg via ORAL
  Filled 2021-09-10 (×3): qty 2

## 2021-09-10 MED ORDER — SODIUM CHLORIDE 0.9 % IV SOLN: 500.0000 mg | INTRAVENOUS | Status: DC

## 2021-09-10 MED ORDER — PREGABALIN 75 MG PO CAPS
75.0000 mg | ORAL_CAPSULE | Freq: Three times a day (TID) | ORAL | Status: DC
  Administered 2021-09-10 – 2021-09-12 (×9): 75 mg via ORAL
  Filled 2021-09-10 (×9): qty 1

## 2021-09-10 MED ORDER — IPRATROPIUM-ALBUTEROL 0.5-2.5 (3) MG/3ML IN SOLN: 3.0000 mL | Freq: Four times a day (QID) | RESPIRATORY_TRACT | Status: DC | PRN

## 2021-09-10 MED ORDER — FOLIC ACID 1 MG PO TABS
1.0000 mg | ORAL_TABLET | Freq: Every day | ORAL | Status: DC
  Administered 2021-09-10 – 2021-09-12 (×3): 1 mg via ORAL
  Filled 2021-09-10 (×3): qty 1

## 2021-09-10 MED ORDER — ENOXAPARIN SODIUM 40 MG/0.4ML IJ SOSY
40.0000 mg | PREFILLED_SYRINGE | INTRAMUSCULAR | Status: DC
  Administered 2021-09-10 – 2021-09-12 (×3): 40 mg via SUBCUTANEOUS
  Filled 2021-09-10 (×3): qty 0.4

## 2021-09-10 MED ORDER — AMLODIPINE BESYLATE 10 MG PO TABS
10.0000 mg | ORAL_TABLET | Freq: Every day | ORAL | Status: DC
  Administered 2021-09-10 – 2021-09-12 (×3): 10 mg via ORAL
  Filled 2021-09-10 (×3): qty 1

## 2021-09-10 MED ORDER — TRAMADOL HCL 50 MG PO TABS
50.0000 mg | ORAL_TABLET | Freq: Three times a day (TID) | ORAL | Status: DC | PRN
  Administered 2021-09-11: 50 mg via ORAL
  Filled 2021-09-10: qty 1

## 2021-09-10 MED ORDER — DOCUSATE SODIUM 100 MG PO CAPS: 100.0000 mg | ORAL_CAPSULE | Freq: Two times a day (BID) | ORAL | Status: DC | PRN

## 2021-09-10 MED ORDER — DULOXETINE HCL 20 MG PO CPEP
20.0000 mg | ORAL_CAPSULE | Freq: Every day | ORAL | Status: DC
  Administered 2021-09-10 – 2021-09-12 (×3): 20 mg via ORAL
  Filled 2021-09-10 (×3): qty 1

## 2021-09-10 MED ORDER — SODIUM CHLORIDE 0.9 % IV SOLN: 1.0000 g | INTRAVENOUS | Status: DC

## 2021-09-10 MED ORDER — ACETAMINOPHEN 500 MG PO TABS: 1000.0000 mg | ORAL_TABLET | Freq: Three times a day (TID) | ORAL | Status: DC | PRN

## 2021-09-10 MED ORDER — AZITHROMYCIN 250 MG PO TABS
500.0000 mg | ORAL_TABLET | Freq: Every day | ORAL | Status: DC
  Administered 2021-09-10 – 2021-09-12 (×3): 500 mg via ORAL
  Filled 2021-09-10 (×3): qty 2

## 2021-09-10 MED ORDER — POLYETHYLENE GLYCOL 3350 17 G PO PACK: 17.0000 g | PACK | Freq: Every day | ORAL | Status: DC | PRN

## 2021-09-10 MED ORDER — PANTOPRAZOLE SODIUM 20 MG PO TBEC
20.0000 mg | DELAYED_RELEASE_TABLET | Freq: Every day | ORAL | Status: DC
  Administered 2021-09-10 – 2021-09-12 (×3): 20 mg via ORAL
  Filled 2021-09-10 (×3): qty 1

## 2021-09-10 MED ORDER — ONDANSETRON HCL 4 MG PO TABS: 4.0000 mg | ORAL_TABLET | Freq: Three times a day (TID) | ORAL | Status: DC | PRN

## 2021-09-10 NOTE — Assessment & Plan Note (Signed)
Controlled.  Hold off on any SSI at this time

## 2021-09-10 NOTE — Assessment & Plan Note (Signed)
Patient with paresthesia of the fourth and fifth digit of bilateral hands.  This is chronic.  Repeat CT cervical spine shows multilevel canal stenosis and this degeneration contributing to his symptoms

## 2021-09-10 NOTE — Progress Notes (Signed)
PT Cancellation Note  Patient Details Name: TRESTEN PANTOJA MRN: [id number] DOB: 1941/03/20   Cancelled Treatment:    Reason Eval/Treat Not Completed: Attempted PT eval-pt declined to participate. Will check back another day. Encouraged pt to participate with therapy--if a note is needed for him to return to SNF    Doreatha Massed, PT Acute Rehabilitation  Office: [phone] Pager: [phone]

## 2021-09-10 NOTE — Assessment & Plan Note (Signed)
Contributory to his weakness

## 2021-09-10 NOTE — Assessment & Plan Note (Signed)
Controlled.  Continue home antihypertensives pending med rec. 

## 2021-09-10 NOTE — Assessment & Plan Note (Signed)
Patient previously in rehab but left AMA last week and since then has not been able to care for himself at home.  Reportedly presented covered in feces. -Multifactorial from community-acquired pneumonia, morbidly obese habitus, and chronic deconditioning.  -PT eval here. -Case management consult to assist with placement

## 2021-09-10 NOTE — Progress Notes (Signed)
[facility] PROGRESS NOTE    Progress Note  Brian Silva  JHE:[id number] DOB: [birth year] DOA: 09/09/2021 PCP: Benito Mccreedy, MD     Brief Narrative:   Brian Silva is an 81 y.o. male past medical history significant for essential hypertension, chronic diastolic heart failure, type 2 diabetes mellitus, chronic kidney disease stage II morbid obesity comes in for worsening weakness and inability to care for herself, it is to note that she left AGAINST MEDICAL ADVICE from rehab on 09/05/2021 comes back in today for worsening weakness stating unable to get up to walk to the bathroom, with decreased oral intake reportedly not able to get take his meals, in the ED apparently was covered in feces.  Labs reveal a white count of 10 lactic acid of 2 chest x-ray was concerning of right lower lobe patchy infiltrate started on Rocephin and azithromycin   Assessment/Plan:   Right lower lobe community-acquired pneumonia: Tmax of 98.7, with a mild leukocytosis. Was started empirically on Rocephin and azithromycin. Has remained afebrile oral intake is improved. Transition to oral Omnicef and azithromycin. Consult physical therapy and Occupational Therapy and TOC will probably need skilled nursing facility placement.  Generalized weakness: Previously left AMA and cannot care for himself at home. Multifactorial in the setting of community-acquired pneumonia morbid obesity and chronic deconditioning. Physical therapy to evaluate will probably need skilled nursing facility.  Cervical radiculopathy: This is chronic repeat CT scan unchanged from previous.  Diabetes mellitus type 2: Controlled, will continue to monitor CBGs before meals and at bedtime.  Essential hypertension: Blood pressure is elevated, resume home regimen.    DVT prophylaxis: lovenox Family Communication:none Status is: Observation The patient remains OBS appropriate and will d/c before 2 midnights.    Code  Status:     Code Status Orders  (From admission, onward)           Start     Ordered   09/10/21 0118  Full code  Continuous        09/10/21 0118           Code Status History     Date Active Date Inactive Code Status Order ID Comments User Context   07/11/2020 1140 07/11/2020 1924 Full Code [id number]  Bonnielee Haff, MD Inpatient   07/11/2020 1138 07/11/2020 1140 DNR [id number]  Bonnielee Haff, MD Inpatient   07/06/2020 0627 07/11/2020 1138 Full Code [id number]  Vianne Bulls, MD ED   12/22/2019 2130 12/24/2019 2356 Full Code [id number]  Clance Boll, MD ED   09/10/2019 2316 09/15/2019 0140 Full Code [id number]  Elwyn Reach, MD Inpatient   01/17/2018 0140 01/18/2018 1658 Full Code [id number]  Vianne Bulls, MD ED   10/25/2015 0243 10/28/2015 2129 Full Code [id number]  Norval Morton, MD Inpatient         IV Access:   Peripheral IV   Procedures and diagnostic studies:   DG Chest 2 View  Result Date: 09/09/2021 CLINICAL DATA:  Altered mental status. EXAM: CHEST - 2 VIEW COMPARISON:  Chest x-ray 09/06/2021 FINDINGS: There are patchy airspace opacities in the right lung base. There is no pleural effusion or pneumothorax. The cardiac silhouette is mildly enlarged unchanged. No acute fractures. IMPRESSION: 1. Patchy airspace opacity in the right lung base worrisome for infection. Recommend follow-up chest x-ray in 4-6 weeks to confirm resolution. Electronically Signed   By: Ronney Asters M.D.   On: 09/09/2021 17:33   CT HEAD WO CONTRAST (5MM)  Result Date: 09/09/2021  CLINICAL DATA:  81 year old male with altered mental status, neck pain and arm numbness. EXAM: CT HEAD WITHOUT CONTRAST CT CERVICAL SPINE WITHOUT CONTRAST TECHNIQUE: Multidetector CT imaging of the head and cervical spine was performed following the standard protocol without intravenous contrast. Multiplanar CT image reconstructions of the cervical spine were also generated. RADIATION DOSE REDUCTION: This exam was  performed according to the departmental dose-optimization program which includes automated exposure control, adjustment of the mA and/or kV according to patient size and/or use of iterative reconstruction technique. COMPARISON:  07/24/2021 CTs and prior studies FINDINGS: CT HEAD FINDINGS Brain: No evidence of acute infarction, hemorrhage, hydrocephalus, extra-axial collection or mass lesion/mass effect. Atrophy and chronic small-vessel white matter ischemic changes again noted. Vascular: Carotid and vertebral atherosclerotic calcifications are noted. Skull: Normal. Negative for fracture or focal lesion. Sinuses/Orbits: No acute finding. RIGHT mastoid effusion again noted. The middle and inner ears are clear. Other: None CT CERVICAL SPINE FINDINGS Decreased sensitivity in the mid-LOWER cervical spine again noted secondary to artifact from patient body habitus. Alignment: Normal. Skull base and vertebrae: No acute fracture. No primary bone lesion or focal pathologic process. Soft tissues and spinal canal: No prevertebral fluid or swelling. No visible canal hematoma. Disc levels: Multilevel degenerative disc disease and facet arthropathy within the cervical spine again noted and unchanged. Along with disc bulges, these findings contribute to the following which are unchanged: C3-4: Mild central spinal and moderate/severe bilateral bony foraminal narrowing C4-5: Mild central spinal and moderate RIGHT and moderate/severe LEFT bony foraminal narrowing C5-6: With OPLL, mild to moderate central spinal and mild to moderate bony biforaminal narrowing C6-7: With OPLL, mild to moderate central spinal and moderate bony biforaminal narrowing Upper chest: No acute abnormality. Other: None IMPRESSION: 1. No evidence of acute intracranial abnormality. Atrophy and chronic small-vessel white matter ischemic changes. 2. No static evidence of acute injury to the cervical spine. Multilevel degenerative changes contributing to central  spinal and bony biforaminal narrowing as described above, unchanged from 07/24/2021. Electronically Signed   By: Margarette Canada M.D.   On: 09/09/2021 17:15   CT Cervical Spine Wo Contrast  Result Date: 09/09/2021 CLINICAL DATA:  81 year old male with altered mental status, neck pain and arm numbness. EXAM: CT HEAD WITHOUT CONTRAST CT CERVICAL SPINE WITHOUT CONTRAST TECHNIQUE: Multidetector CT imaging of the head and cervical spine was performed following the standard protocol without intravenous contrast. Multiplanar CT image reconstructions of the cervical spine were also generated. RADIATION DOSE REDUCTION: This exam was performed according to the departmental dose-optimization program which includes automated exposure control, adjustment of the mA and/or kV according to patient size and/or use of iterative reconstruction technique. COMPARISON:  07/24/2021 CTs and prior studies FINDINGS: CT HEAD FINDINGS Brain: No evidence of acute infarction, hemorrhage, hydrocephalus, extra-axial collection or mass lesion/mass effect. Atrophy and chronic small-vessel white matter ischemic changes again noted. Vascular: Carotid and vertebral atherosclerotic calcifications are noted. Skull: Normal. Negative for fracture or focal lesion. Sinuses/Orbits: No acute finding. RIGHT mastoid effusion again noted. The middle and inner ears are clear. Other: None CT CERVICAL SPINE FINDINGS Decreased sensitivity in the mid-LOWER cervical spine again noted secondary to artifact from patient body habitus. Alignment: Normal. Skull base and vertebrae: No acute fracture. No primary bone lesion or focal pathologic process. Soft tissues and spinal canal: No prevertebral fluid or swelling. No visible canal hematoma. Disc levels: Multilevel degenerative disc disease and facet arthropathy within the cervical spine again noted and unchanged. Along with disc bulges, these findings contribute to  the following which are unchanged: C3-4: Mild central  spinal and moderate/severe bilateral bony foraminal narrowing C4-5: Mild central spinal and moderate RIGHT and moderate/severe LEFT bony foraminal narrowing C5-6: With OPLL, mild to moderate central spinal and mild to moderate bony biforaminal narrowing C6-7: With OPLL, mild to moderate central spinal and moderate bony biforaminal narrowing Upper chest: No acute abnormality. Other: None IMPRESSION: 1. No evidence of acute intracranial abnormality. Atrophy and chronic small-vessel white matter ischemic changes. 2. No static evidence of acute injury to the cervical spine. Multilevel degenerative changes contributing to central spinal and bony biforaminal narrowing as described above, unchanged from 07/24/2021. Electronically Signed   By: Margarette Canada M.D.   On: 09/09/2021 17:15     Medical Consultants:   None.   Subjective:    Brian Silva relates his breathing is better.  Objective:    Vitals:   09/09/21 1729 09/09/21 2024 09/09/21 2347 09/10/21 0426  BP: 101/70 (!) 162/75 (!) 152/62 (!) 161/63  Pulse: 82 89 79 80  Resp: 16 18 18 20   Temp: 98.7 F (37.1 C) 98 F (36.7 C) 98.7 F (37.1 C) 98.2 F (36.8 C)  TempSrc: Oral Oral Oral Oral  SpO2: 100% 95% 95% 97%   SpO2: 97 %   Intake/Output Summary (Last 24 hours) at 09/10/2021 0752 Last data filed at 09/10/2021 0720 Gross per 24 hour  Intake 1565.53 ml  Output 700 ml  Net 865.53 ml   There were no vitals filed for this visit.  Exam: General exam: In no acute distress. Respiratory system: Good air movement and crackles at the right lung base Cardiovascular system: S1 & S2 heard, RRR. No JVD. Gastrointestinal system: Abdomen is nondistended, soft and nontender.  Extremities: No pedal edema. Skin: No rashes, lesions or ulcers Psychiatry: Judgement and insight appear normal. Mood & affect appropriate.    Data Reviewed:    Labs: Basic Metabolic Panel: Recent Labs  Lab 09/06/21 2225 09/09/21 1629 09/10/21 0222  NA  138 142 141  K 3.7 3.8 3.0*  CL 106 104 106  CO2 23 26 26   GLUCOSE 155* 149* 134*  BUN 30* 21 23  CREATININE 1.83* 1.78* 1.87*  CALCIUM 9.1 9.4 8.5*   GFR CrCl cannot be calculated (Unknown ideal weight.). Liver Function Tests: Recent Labs  Lab 09/09/21 1629  AST 21  ALT 18  ALKPHOS 132*  BILITOT 0.8  PROT 8.5*  ALBUMIN 3.9   No results for input(s): LIPASE, AMYLASE in the last 168 hours. No results for input(s): AMMONIA in the last 168 hours. Coagulation profile No results for input(s): INR, PROTIME in the last 168 hours. COVID-19 Labs  No results for input(s): DDIMER, FERRITIN, LDH, CRP in the last 72 hours.  Lab Results  Component Value Date   SARSCOV2NAA NEGATIVE 09/09/2021   SARSCOV2NAA NEGATIVE 01/09/2021   [place] NEGATIVE 07/11/2020   [place] NEGATIVE 07/06/2020    CBC: Recent Labs  Lab 09/06/21 2225 09/09/21 1821 09/10/21 0222  WBC 12.7* 10.8* 11.6*  NEUTROABS  --  9.0*  --   HGB 10.8* 8.8* 10.0*  HCT 33.9* 27.1* 30.1*  MCV 88.5 88.3 86.5  PLT 385 307 340   Cardiac Enzymes: Recent Labs  Lab 09/09/21 1629  CKTOTAL 76   BNP (last 3 results) No results for input(s): PROBNP in the last 8760 hours. CBG: No results for input(s): GLUCAP in the last 168 hours. D-Dimer: No results for input(s): DDIMER in the last 72 hours. Hgb A1c: No results for  input(s): HGBA1C in the last 72 hours. Lipid Profile: No results for input(s): CHOL, HDL, LDLCALC, TRIG, CHOLHDL, LDLDIRECT in the last 72 hours. Thyroid function studies: No results for input(s): TSH, T4TOTAL, T3FREE, THYROIDAB in the last 72 hours.  Invalid input(s): FREET3 Anemia work up: No results for input(s): VITAMINB12, FOLATE, FERRITIN, TIBC, IRON, RETICCTPCT in the last 72 hours. Sepsis Labs: Recent Labs  Lab 09/06/21 2225 09/09/21 1810 09/09/21 1821 09/09/21 2033 09/09/21 2323 09/10/21 0222  WBC 12.7*  --  10.8*  --   --  11.6*  LATICACIDVEN  --  1.1  --  2.0* 0.9 0.7    Microbiology Recent Results (from the past 240 hour(s))  Resp Panel by RT-PCR (Flu A&B, Covid) Nasopharyngeal Swab     Status: None   Collection Time: 09/09/21  8:00 PM   Specimen: Nasopharyngeal Swab; Nasopharyngeal(NP) swabs in vial transport medium  Result Value Ref Range Status   SARS Coronavirus 2 by RT PCR NEGATIVE NEGATIVE Final    Comment: (NOTE) SARS-CoV-2 target nucleic acids are NOT DETECTED.  The SARS-CoV-2 RNA is generally detectable in upper respiratory specimens during the acute phase of infection. The lowest concentration of SARS-CoV-2 viral copies this assay can detect is 138 copies/mL. A negative result does not preclude SARS-Cov-2 infection and should not be used as the sole basis for treatment or other patient management decisions. A negative result may occur with  improper specimen collection/handling, submission of specimen other than nasopharyngeal swab, presence of viral mutation(s) within the areas targeted by this assay, and inadequate number of viral copies(<138 copies/mL). A negative result must be combined with clinical observations, patient history, and epidemiological information. The expected result is Negative.  Fact Sheet for Patients:  EntrepreneurPulse.com.au  Fact Sheet for Healthcare Providers:  IncredibleEmployment.be  This test is no t yet approved or cleared by the [place] FDA and  has been authorized for detection and/or diagnosis of SARS-CoV-2 by FDA under an Emergency Use Authorization (EUA). This EUA will remain  in effect (meaning this test can be used) for the duration of the COVID-19 declaration under Section 564(b)(1) of the Act, 21 U.S.C.section 360bbb-3(b)(1), unless the authorization is terminated  or revoked sooner.       Influenza A by PCR NEGATIVE NEGATIVE Final   Influenza B by PCR NEGATIVE NEGATIVE Final    Comment: (NOTE) The Xpert Xpress SARS-CoV-2/FLU/RSV plus assay is  intended as an aid in the diagnosis of influenza from Nasopharyngeal swab specimens and should not be used as a sole basis for treatment. Nasal washings and aspirates are unacceptable for Xpert Xpress SARS-CoV-2/FLU/RSV testing.  Fact Sheet for Patients: EntrepreneurPulse.com.au  Fact Sheet for Healthcare Providers: IncredibleEmployment.be  This test is not yet approved or cleared by the [place] FDA and has been authorized for detection and/or diagnosis of SARS-CoV-2 by FDA under an Emergency Use Authorization (EUA). This EUA will remain in effect (meaning this test can be used) for the duration of the COVID-19 declaration under Section 564(b)(1) of the Act, 21 U.S.C. section 360bbb-3(b)(1), unless the authorization is terminated or revoked.  Performed at [facility], [place] [address] [place], [place] [address]      Medications:    enoxaparin (LOVENOX) injection  40 mg Subcutaneous Q24H   pneumococcal 20-valent conjugate vaccine  0.5 mL Intramuscular Tomorrow-1000   Continuous Infusions:  azithromycin     cefTRIAXone (ROCEPHIN)  IV     lactated ringers 75 mL/hr at 09/09/21 2302  LOS: 0 days   Charlynne Cousins  [facility]  09/10/2021, 7:52 AM

## 2021-09-10 NOTE — Assessment & Plan Note (Signed)
Presented with mild leukocytosis, elevated lactate and chest x-ray concerning for right lower lobe patchy opacity -Continue IV Rocephin and azithromycin

## 2021-09-11 DIAGNOSIS — M5412 Radiculopathy, cervical region: Secondary | ICD-10-CM | POA: Diagnosis not present

## 2021-09-11 DIAGNOSIS — J189 Pneumonia, unspecified organism: Secondary | ICD-10-CM | POA: Diagnosis not present

## 2021-09-11 DIAGNOSIS — R531 Weakness: Secondary | ICD-10-CM | POA: Diagnosis not present

## 2021-09-11 DIAGNOSIS — I10 Essential (primary) hypertension: Secondary | ICD-10-CM | POA: Diagnosis not present

## 2021-09-11 DIAGNOSIS — E119 Type 2 diabetes mellitus without complications: Secondary | ICD-10-CM | POA: Diagnosis not present

## 2021-09-11 MED ORDER — NYSTATIN 100000 UNIT/GM EX POWD: 1.0000 "application " | CUTANEOUS | Status: DC

## 2021-09-11 MED ORDER — AZITHROMYCIN 250 MG PO TABS: ORAL_TABLET | ORAL | 0 refills | Status: DC

## 2021-09-11 MED ORDER — POTASSIUM CHLORIDE 20 MEQ PO PACK
40.0000 meq | PACK | Freq: Two times a day (BID) | ORAL | Status: AC
Start: 2021-09-11 — End: 2021-09-11
  Administered 2021-09-11 (×2): 40 meq via ORAL
  Filled 2021-09-11 (×2): qty 2

## 2021-09-11 MED ORDER — CEFDINIR 300 MG PO CAPS: 300.0000 mg | ORAL_CAPSULE | Freq: Two times a day (BID) | ORAL | 0 refills | Status: AC

## 2021-09-11 MED ORDER — VITAMIN B-12 1000 MCG PO TABS
500.0000 ug | ORAL_TABLET | Freq: Every day | ORAL | Status: DC
  Administered 2021-09-11 – 2021-09-12 (×2): 500 ug via ORAL
  Filled 2021-09-11 (×2): qty 1

## 2021-09-11 MED ORDER — FUROSEMIDE 40 MG PO TABS
40.0000 mg | ORAL_TABLET | Freq: Two times a day (BID) | ORAL | Status: DC
  Administered 2021-09-11 – 2021-09-12 (×4): 40 mg via ORAL
  Filled 2021-09-11 (×4): qty 1

## 2021-09-11 MED ORDER — MENTHOL (TOPICAL ANALGESIC) 4 % EX GEL: 1.0000 "application " | Freq: Four times a day (QID) | CUTANEOUS | Status: DC | PRN

## 2021-09-11 MED ORDER — MUSCLE RUB 10-15 % EX CREA: TOPICAL_CREAM | Freq: Four times a day (QID) | CUTANEOUS | Status: DC | PRN

## 2021-09-11 MED ORDER — NYSTATIN 100000 UNIT/GM EX POWD
Freq: Two times a day (BID) | CUTANEOUS | Status: DC
  Filled 2021-09-11: qty 15

## 2021-09-11 NOTE — Evaluation (Signed)
Occupational Therapy Evaluation Patient Details Name: Brian Silva MRN: [id number] DOB: [birth year] Today's Date: 09/11/2021   History of Present Illness patient is a 81 year old male who presented to the hospital with difficulty standing after leaving SNF. patient was admitted with right lower lobe pneumonia, and weakness. PMH: HTN, diastolic heart failure, CKD II, cervical radiculopathy with parastheia 4th 5th digits, morbid obesity.   Clinical Impression   Patient is a 81 year old male who had recently d/c from SNF home alone about two days prior to admission. Patient was noted to have increased pain in BLE, decreased functional activity tolerance, decreased endurance, decreased sitting balance and standing balance, obesity, and numbness in 4th and 5th digits impacting participation in ADLs. Patient would continue to benefit from skilled OT services at this time while admitted and after d/c to address noted deficits in order to improve overall safety and independence in ADLs.       Recommendations for follow up therapy are one component of a multi-disciplinary discharge planning process, led by the attending physician.  Recommendations may be updated based on patient status, additional functional criteria and insurance authorization.   Follow Up Recommendations  Skilled nursing-short term rehab (<3 hours/day)    Assistance Recommended at Discharge Frequent or constant Supervision/Assistance  Patient can return home with the following A lot of help with walking and/or transfers;A lot of help with bathing/dressing/bathroom;Assistance with cooking/housework;Direct supervision/assist for financial management;Assist for transportation;Help with stairs or ramp for entrance;Direct supervision/assist for medications management    Functional Status Assessment  Patient has had a recent decline in their functional status and demonstrates the ability to make significant improvements in function in a  reasonable and predictable amount of time.  Equipment Recommendations  None recommended by OT    Recommendations for Other Services       Precautions / Restrictions Precautions Precautions: Fall Precaution Comments: numbness 4th and 5th digits bilaterally with history of cervical radiculopathy. Restrictions Weight Bearing Restrictions: No      Mobility Bed Mobility Overal bed mobility: Needs Assistance Bed Mobility: Supine to Sit, Sit to Supine     Supine to sit: Min assist, HOB elevated Sit to supine: Max assist   General bed mobility comments: patient needed physical assistance to get BLE back onto bed with increased time.    Transfers                          Balance Overall balance assessment: Needs assistance Sitting-balance support: No upper extremity supported, Feet supported Sitting balance-Leahy Scale: Fair     Standing balance support: Reliant on assistive device for balance, Bilateral upper extremity supported, During functional activity Standing balance-Leahy Scale: Poor                             ADL either performed or assessed with clinical judgement   ADL Overall ADL's : Needs assistance/impaired Eating/Feeding: Set up;Bed level   Grooming: Set up;Sitting   Upper Body Bathing: Minimal assistance;Sitting   Lower Body Bathing: Maximal assistance;Sit to/from stand;Sitting/lateral leans   Upper Body Dressing : Moderate assistance;Sitting   Lower Body Dressing: Maximal assistance;Sit to/from stand;Sitting/lateral leans   Toilet Transfer: +2 for physical assistance;+2 for safety/equipment;Moderate assistance Toilet Transfer Details (indicate cue type and reason): patient declined to sit up in recliner on this date with patient able to take a few small steps to head of bed with RW with  patient reporting increased onset of pain in bilateral knees. Toileting- Clothing Manipulation and Hygiene: Maximal assistance;Sit to/from  stand               Vision Patient Visual Report: No change from baseline       Perception     Praxis      Pertinent Vitals/Pain Pain Assessment Pain Assessment: Faces Faces Pain Scale: Hurts little more Pain Location: bilateral knees with standing Pain Descriptors / Indicators: Discomfort, Grimacing, Guarding Pain Intervention(s): Limited activity within patient's tolerance, Monitored during session, Repositioned     Hand Dominance Right   Extremity/Trunk Assessment Upper Extremity Assessment Upper Extremity Assessment: RUE deficits/detail;LUE deficits/detail RUE Deficits / Details: numbness 4th and 5th digits bilaterally with history of cervical radiculopathy   Lower Extremity Assessment Lower Extremity Assessment: Defer to PT evaluation   Cervical / Trunk Assessment Cervical / Trunk Assessment: Normal   Communication Communication Communication: No difficulties   Cognition Arousal/Alertness: Awake/alert Behavior During Therapy: WFL for tasks assessed/performed Overall Cognitive Status: Difficult to assess                                 General Comments: patient was noted to have some difficulty with reports of his baseline for ADLs at rehab.     General Comments  patient reported having a "panic attack" each time he exerts himself. O2 saturation was 97% on RA and HR was 88 bpm at time of this onset. patient returned to bed.    Exercises     Shoulder Instructions      Home Living Family/patient expects to be discharged to:: Skilled nursing facility                                 Additional Comments: patient has a history of being at a skilled nursing facility in the past per chart review.      Prior Functioning/Environment Prior Level of Function : Needs assist             Mobility Comments: patient reported being able to walk with a walker prior to signing AMA out of [facility]thals. ADLs Comments: patient reported  " i wasnt going anywhere so i kept to socks on for two days". patient was unable to reprot how he was doing a rehab prior to d/c home. no family or friends in room at this time. patient did report living in a recliner at home.        OT Problem List: Impaired balance (sitting and/or standing);Decreased safety awareness;Decreased knowledge of precautions;Decreased knowledge of use of DME or AE;Obesity      OT Treatment/Interventions: Self-care/ADL training;Therapeutic exercise;Neuromuscular education;Energy conservation;DME and/or AE instruction;Therapeutic activities;Balance training;Patient/family education    OT Goals(Current goals can be found in the care plan section) Acute Rehab OT Goals Patient Stated Goal: to move around better. OT Goal Formulation: With patient Time For Goal Achievement: 09/25/21 Potential to Achieve Goals: Fair  OT Frequency: Min 2X/week    Co-evaluation PT/OT/SLP Co-Evaluation/Treatment: Yes Reason for Co-Treatment: For patient/therapist safety;To address functional/ADL transfers PT goals addressed during session: Mobility/safety with mobility OT goals addressed during session: ADL's and self-care      AM-PAC OT "6 Clicks" Daily Activity     Outcome Measure Help from another person eating meals?: A Little Help from another person taking care of personal grooming?: A Little Help from another  person toileting, which includes using toliet, bedpan, or urinal?: A Lot Help from another person bathing (including washing, rinsing, drying)?: A Lot Help from another person to put on and taking off regular upper body clothing?: A Little Help from another person to put on and taking off regular lower body clothing?: A Lot 6 Click Score: 15   End of Session Equipment Utilized During Treatment: Gait belt;Rolling walker (2 wheels) Nurse Communication: Other (comment) (patient reported he needed his medications for a panic attack)  Activity Tolerance: Patient limited  by fatigue Patient left: in bed;with call bell/phone within reach;with bed alarm set  OT Visit Diagnosis: Unsteadiness on feet (R26.81);Other abnormalities of gait and mobility (R26.89);Muscle weakness (generalized) (M62.81);Pain                Time: 1324-4010 OT Time Calculation (min): 15 min Charges:  OT General Charges $OT Visit: 1 Visit OT Evaluation $OT Eval Moderate Complexity: 1 Mod  Brian Silva OTR/L, MS Acute Rehabilitation Department Office# [phone] Pager# [phone]   Brian Silva 09/11/2021, 12:28 PM

## 2021-09-11 NOTE — Progress Notes (Addendum)
PHARMACY - PHYSICIAN COMMUNICATION CRITICAL VALUE ALERT - BLOOD CULTURE IDENTIFICATION (BCID)  Brian Silva is an 81 y.o. male who presented to [facility] on 09/09/2021 with a chief complaint of failure to thrive, worsening weakness.   Assessment:   - right lower lobe community-acquired pneumonia - now 1/3 blood culture bottles + for gram positive rods.   Name of physician (or Provider) Contacted: Dr. Aileen Fass  Current antibiotics: Ceftriaxone > Cefdinir + Azithromycin  Changes to prescribed antibiotics recommended:  None. MD suspects the GPR is a contaminant.    Brian Silva 09/11/2021  3:42 PM

## 2021-09-11 NOTE — NC FL2 (Signed)
[place] LEVEL OF CARE SCREENING TOOL     IDENTIFICATION  Patient Name: Brian Silva Birthdate: 04-08-1941 Sex: male Admission Date (Current Location): 09/09/2021  [place] Number:  Brian Silva [id number] [place] and Address:  [facility],  [address] [place], [place]      Provider Number: [id number]  Attending Physician Name and Address:  Charlynne Cousins, MD  Relative Name and Phone Number:  Dominic Pea   [phone]    Current Level of Care: Hospital Recommended Level of Care: [place] Prior Approval Number:    Date Approved/Denied:   PASRR Number: [id number]  Discharge Plan: SNF    Current Diagnoses: Patient Active Problem List   Diagnosis Date Noted   Weakness 09/10/2021   Cervical radiculopathy 09/10/2021   Community acquired pneumonia 09/09/2021   Bacteremia    Acute renal failure superimposed on stage 2 chronic kidney disease ([place]) 27/78/2423   Metabolic acidosis 53/61/4431   Uremia 07/06/2020   Hyperkalemia 07/06/2020   Chronic diastolic CHF (congestive heart failure) ([place]) 07/06/2020   Pneumonia of both lower lobes due to infectious organism 12/23/2019   CAP (community acquired pneumonia) 12/22/2019   Compression fracture of body of thoracic vertebra ([place]) 09/10/2019   Fall at home, initial encounter 09/10/2019   AKI (acute kidney injury) ([place]) 01/17/2018   Syncope, vasovagal 01/17/2018   Diabetes mellitus type II, non insulin dependent ([place]) 01/17/2018   Hyponatremia 01/17/2018   Syncope 01/17/2018   Depression 10/25/2015   Hyperlipidemia 10/25/2015   TINEA PEDIS 01/04/2010   TINEA VERSICOLOR 01/04/2010   URI 01/04/2010   HYPERGLYCEMIA 12/02/2008   ABDOMINAL WALL HERNIA 12/01/2007   OBESITY, MORBID 04/04/2007   SLEEP APNEA, OBSTRUCTIVE 04/04/2007   LOW BACK PAIN, CHRONIC 12/07/2004   HYPERLIPIDEMIA 08/12/2002   ALLERGIC RHINITIS, SEASONAL 08/12/2002   Essential  hypertension 04/01/2000   Asthma 10/11/1997   SUPERFICIAL PHLEBITIS 05/06/1990    Orientation RESPIRATION BLADDER Height & Weight     Self, Time, Situation, Place  Normal Continent Weight: (!) 363 lb 15.7 oz (165.1 kg) Height:  5\' 10"  (177.8 cm)  BEHAVIORAL SYMPTOMS/MOOD NEUROLOGICAL BOWEL NUTRITION STATUS      Continent Diet (Cardiac)  AMBULATORY STATUS COMMUNICATION OF NEEDS Skin   Limited Assist Verbally Normal                       Personal Care Assistance Level of Assistance  Bathing, Feeding, Dressing Bathing Assistance: Limited assistance Feeding assistance: Independent Dressing Assistance: Limited assistance     Functional Limitations Info  Sight, Hearing, Speech Sight Info: Adequate Hearing Info: Adequate Speech Info: Adequate    SPECIAL CARE FACTORS FREQUENCY  PT (By licensed PT), OT (By licensed OT)     PT Frequency: Minimum 5x a week OT Frequency: Minimum 5x a week            Contractures Contractures Info: Not present    Additional Factors Info  Code Status, Allergies Code Status Info: Full code Allergies Info: No Known Allergies           Current Medications (09/11/2021):  This is the current hospital active medication list Current Facility-Administered Medications  Medication Dose Route Frequency Provider Last Rate Last Admin   acetaminophen (TYLENOL) tablet 1,000 mg  1,000 mg Oral Q8H PRN Charlynne Cousins, MD       amLODipine (NORVASC) tablet 10 mg  10 mg Oral Daily Charlynne Cousins, MD   10 mg at 09/11/21  8295   aspirin EC tablet 81 mg  81 mg Oral Daily Charlynne Cousins, MD   81 mg at 09/11/21 6213   atorvastatin (LIPITOR) tablet 20 mg  20 mg Oral QHS Charlynne Cousins, MD   20 mg at 09/10/21 2156   azithromycin (ZITHROMAX) tablet 500 mg  500 mg Oral Daily Charlynne Cousins, MD   500 mg at 09/11/21 0865   cefdinir (OMNICEF) capsule 300 mg  300 mg Oral Q12H Charlynne Cousins, MD   300 mg at 09/11/21 7846   docusate  sodium (COLACE) capsule 100 mg  100 mg Oral BID PRN Charlynne Cousins, MD       DULoxetine (CYMBALTA) DR capsule 20 mg  20 mg Oral Daily Charlynne Cousins, MD   20 mg at 09/11/21 0910   enoxaparin (LOVENOX) injection 40 mg  40 mg Subcutaneous Q24H Tu, Ching T, DO   40 mg at 96/29/52 8413   folic acid (FOLVITE) tablet 1 mg  1 mg Oral Daily Charlynne Cousins, MD   1 mg at 09/11/21 0910   furosemide (LASIX) tablet 40 mg  40 mg Oral BID Charlynne Cousins, MD   40 mg at 09/11/21 0911   ipratropium-albuterol (DUONEB) 0.5-2.5 (3) MG/3ML nebulizer solution 3 mL  3 mL Nebulization Q6H PRN Charlynne Cousins, MD       Muscle Rub CREA   Topical Q6H PRN Charlynne Cousins, MD       nystatin (MYCOSTATIN/NYSTOP) topical powder   Topical BID Charlynne Cousins, MD   Given at 09/11/21 1018   ondansetron (ZOFRAN) tablet 4 mg  4 mg Oral Q8H PRN Charlynne Cousins, MD       pantoprazole (PROTONIX) EC tablet 20 mg  20 mg Oral Daily Charlynne Cousins, MD   20 mg at 09/11/21 0910   polyethylene glycol (MIRALAX / GLYCOLAX) packet 17 g  17 g Oral Daily PRN Charlynne Cousins, MD       potassium chloride (KLOR-CON) packet 40 mEq  40 mEq Oral BID Charlynne Cousins, MD   40 mEq at 09/11/21 0911   pregabalin (LYRICA) capsule 75 mg  75 mg Oral TID Charlynne Cousins, MD   75 mg at 09/11/21 0910   traMADol (ULTRAM) tablet 50 mg  50 mg Oral Q8H PRN Charlynne Cousins, MD   50 mg at 09/11/21 0915   vitamin B-12 (CYANOCOBALAMIN) tablet 500 mcg  500 mcg Oral Daily Charlynne Cousins, MD   500 mcg at 09/11/21 2440     Discharge Medications: Please see discharge summary for a list of discharge medications.  Relevant Imaging Results:  Relevant Lab Results:   Additional Information SSN [id number]  Brian Ludwig, LCSW

## 2021-09-11 NOTE — TOC Progression Note (Addendum)
Transition of Care [facility]) - Progression Note    Patient Details  Name: Brian Silva MRN: [id number] Date of Birth: 1941-04-22  [facility] CM/SW Contact  Ross Ludwig, [place] Phone Number: 09/11/2021, 12:27 PM  Clinical Narrative:     CSW spoke to patient and informed him that since he is being followed by [facility] he is eligible for SNF placement.  CSW discussed with patient, and he is agreeable to SNF placement to [facility] preferred facility under [facility] Medicare waiver program.  CSW awaiting for bed offers.  3:30pm  CSW still waiting for bed offers, several facilities said no, still waiting to hear back from a few others.       Expected Discharge Plan and Services           Expected Discharge Date: 09/11/21                                     Social Determinants of Health (SDOH) Interventions    Readmission Risk Interventions     View : No data to display.

## 2021-09-11 NOTE — Discharge Summary (Addendum)
Physician Discharge Summary  Brian Silva MWU:[id number] DOB: [birth year] DOA: 09/09/2021  PCP: Benito Mccreedy, MD  Admit date: 09/09/2021 Discharge date: 09/11/2021  Admitted From: Home Disposition:  SNf  Recommendations for Outpatient Follow-up:  Follow up with PCP in 1-2 weeks Please obtain BMP/CBC in one week   Home Health:Yes Equipment/Devices:None  Discharge Condition:Stable CODE STATUS:Full Diet recommendation: Heart Healthy   Brief/Interim Summary: 81 y.o. male past medical history significant for essential hypertension, chronic diastolic heart failure, type 2 diabetes mellitus, chronic kidney disease stage II morbid obesity comes in for worsening weakness and inability to care for herself, it is to note that she left [facility] from rehab on 09/05/2021 comes back in today for worsening weakness stating unable to get up to walk to the bathroom, with decreased oral intake reportedly not able to get take his meals, in the ED apparently was covered in feces.  Labs reveal a white count of 10 lactic acid of 2 chest x-ray was concerning of right lower lobe patchy infiltrate started on Rocephin and azithromycin  Discharge Diagnoses:  Principal Problem:   Weakness Active Problems:   Community acquired pneumonia   OBESITY, MORBID   Essential hypertension   Diabetes mellitus type II, non insulin dependent (HCC)   Cervical radiculopathy  Right lower lobe community-acquired pneumonia: He was started empirically on IV Rocephin and azithromycin he defervesced initially was on 2 L of oxygen was weaned to room air. He was transitioned to oral Omnicef and azithromycin which she will continue as an outpatient for 4 more days. Physical therapy was consulted as well as Occupational Therapy he will need skilled nursing rehab  Generalized weakness: Multiple times he left AMA from the hospital in from the facility. He cannot take care of himself at home this is likely  multifactorial in the setting of morbid obesity, chronic deconditioning and now with new community-acquired pneumonia.  Cervical radiculopathy: Chronic CT unchanged from previous.  Diabetes mellitus type 2: No change made to his medication continue current regimen.  Essential hypertension: No changes made to his medication. Discharge Instructions  Discharge Instructions     Diet - low sodium heart healthy   Complete by: As directed    Increase activity slowly   Complete by: As directed       Allergies as of 09/11/2021   No Known Allergies      Medication List     TAKE these medications    acetaminophen 500 MG tablet Commonly known as: TYLENOL Take 2 tablets (1,000 mg total) by mouth every 8 (eight) hours. What changed:  when to take this reasons to take this   albuterol 108 (90 Base) MCG/ACT inhaler Commonly known as: VENTOLIN HFA Inhale 2 puffs into the lungs every 6 (six) hours as needed. For shortness of breath   amLODipine 10 MG tablet Commonly known as: NORVASC Take 10 mg by mouth daily.   aspirin EC 81 MG tablet Take 81 mg by mouth daily.   atorvastatin 20 MG tablet Commonly known as: LIPITOR Take 20 mg by mouth at bedtime.   azithromycin 250 MG tablet Commonly known as: ZITHROMAX daily   benzocaine-menthol 6-10 MG lozenge Commonly known as: CHLORAEPTIC Take 1 lozenge by mouth every 2 (two) hours as needed for sore throat.   Biofreeze 4 % Gel Generic drug: Menthol (Topical Analgesic) Apply 1 application topically every 6 (six) hours as needed (joint pain).   cefdinir 300 MG capsule Commonly known as: OMNICEF Take 1 capsule (300 mg  total) by mouth every 12 (twelve) hours for 3 days.   DERMACLOUD EX Apply 1 application topically in the morning and at bedtime. buttocks   docusate sodium 100 MG capsule Commonly known as: COLACE Take 100 mg by mouth 2 (two) times daily as needed (for constipation).   DULoxetine 20 MG capsule Commonly known  as: CYMBALTA Take 20 mg by mouth daily.   folic acid 1 MG tablet Commonly known as: FOLVITE Take 1 tablet (1 mg total) by mouth daily.   furosemide 40 MG tablet Commonly known as: LASIX Take 40 mg by mouth 2 (two) times daily.   guaiFENesin 600 MG 12 hr tablet Commonly known as: MUCINEX Take 600 mg by mouth 2 (two) times daily as needed for cough.   ipratropium-albuterol 0.5-2.5 (3) MG/3ML Soln Commonly known as: DUONEB Take 3 mLs by nebulization every 6 (six) hours as needed for shortness of breath or wheezing.   nystatin powder Commonly known as: MYCOSTATIN/NYSTOP Apply 1 application topically See admin instructions. Apply to abdominal folds twice daily for redness or rash   ondansetron 4 MG tablet Commonly known as: ZOFRAN Take 4 mg by mouth every 8 (eight) hours as needed for nausea or vomiting.   pantoprazole 20 MG tablet Commonly known as: PROTONIX Take 20 mg by mouth daily.   polyethylene glycol 17 g packet Commonly known as: MIRALAX / GLYCOLAX Take 17 g by mouth daily as needed for mild constipation.   pregabalin 75 MG capsule Commonly known as: LYRICA Take 75 mg by mouth 3 (three) times daily.   Probiotic 250 MG Caps Take 1 capsule by mouth 2 (two) times daily.   traMADol 50 MG tablet Commonly known as: ULTRAM Take 1 tablet (50 mg total) by mouth every 8 (eight) hours as needed for moderate pain.   vitamin B-12 500 MCG tablet Commonly known as: CYANOCOBALAMIN Take 1 tablet (500 mcg total) by mouth daily.   Vitamin D3 125 MCG (5000 UT) Caps Take 5,000 Units by mouth daily.        No Known Allergies  Consultations: None   Procedures/Studies: DG Chest 2 View  Result Date: 09/09/2021 CLINICAL DATA:  Altered mental status. EXAM: CHEST - 2 VIEW COMPARISON:  Chest x-ray 09/06/2021 FINDINGS: There are patchy airspace opacities in the right lung base. There is no pleural effusion or pneumothorax. The cardiac silhouette is mildly enlarged unchanged. No  acute fractures. IMPRESSION: 1. Patchy airspace opacity in the right lung base worrisome for infection. Recommend follow-up chest x-ray in 4-6 weeks to confirm resolution. Electronically Signed   By: Ronney Asters M.D.   On: 09/09/2021 17:33   DG Chest 2 View  Result Date: 09/06/2021 CLINICAL DATA:  Dyspnea on exertion. EXAM: CHEST - 2 VIEW COMPARISON:  Chest x-ray 07/06/2020 FINDINGS: The heart is enlarged, unchanged. There are minimal patchy opacities in the left lung base. There is no pleural effusion or pneumothorax. No acute fracture. IMPRESSION: 1.  Minimal left basilar atelectasis/airspace disease 2.  Stable cardiomegaly. Electronically Signed   By: Ronney Asters M.D.   On: 09/06/2021 23:51   CT HEAD WO CONTRAST (5MM)  Result Date: 09/09/2021 CLINICAL DATA:  81 year old male with altered mental status, neck pain and arm numbness. EXAM: CT HEAD WITHOUT CONTRAST CT CERVICAL SPINE WITHOUT CONTRAST TECHNIQUE: Multidetector CT imaging of the head and cervical spine was performed following the standard protocol without intravenous contrast. Multiplanar CT image reconstructions of the cervical spine were also generated. RADIATION DOSE REDUCTION: This exam was performed  according to the departmental dose-optimization program which includes automated exposure control, adjustment of the mA and/or kV according to patient size and/or use of iterative reconstruction technique. COMPARISON:  07/24/2021 CTs and prior studies FINDINGS: CT HEAD FINDINGS Brain: No evidence of acute infarction, hemorrhage, hydrocephalus, extra-axial collection or mass lesion/mass effect. Atrophy and chronic small-vessel white matter ischemic changes again noted. Vascular: Carotid and vertebral atherosclerotic calcifications are noted. Skull: Normal. Negative for fracture or focal lesion. Sinuses/Orbits: No acute finding. RIGHT mastoid effusion again noted. The middle and inner ears are clear. Other: None CT CERVICAL SPINE FINDINGS  Decreased sensitivity in the mid-LOWER cervical spine again noted secondary to artifact from patient body habitus. Alignment: Normal. Skull base and vertebrae: No acute fracture. No primary bone lesion or focal pathologic process. Soft tissues and spinal canal: No prevertebral fluid or swelling. No visible canal hematoma. Disc levels: Multilevel degenerative disc disease and facet arthropathy within the cervical spine again noted and unchanged. Along with disc bulges, these findings contribute to the following which are unchanged: C3-4: Mild central spinal and moderate/severe bilateral bony foraminal narrowing C4-5: Mild central spinal and moderate RIGHT and moderate/severe LEFT bony foraminal narrowing C5-6: With OPLL, mild to moderate central spinal and mild to moderate bony biforaminal narrowing C6-7: With OPLL, mild to moderate central spinal and moderate bony biforaminal narrowing Upper chest: No acute abnormality. Other: None IMPRESSION: 1. No evidence of acute intracranial abnormality. Atrophy and chronic small-vessel white matter ischemic changes. 2. No static evidence of acute injury to the cervical spine. Multilevel degenerative changes contributing to central spinal and bony biforaminal narrowing as described above, unchanged from 07/24/2021. Electronically Signed   By: Margarette Canada M.D.   On: 09/09/2021 17:15   CT Cervical Spine Wo Contrast  Result Date: 09/09/2021 CLINICAL DATA:  81 year old male with altered mental status, neck pain and arm numbness. EXAM: CT HEAD WITHOUT CONTRAST CT CERVICAL SPINE WITHOUT CONTRAST TECHNIQUE: Multidetector CT imaging of the head and cervical spine was performed following the standard protocol without intravenous contrast. Multiplanar CT image reconstructions of the cervical spine were also generated. RADIATION DOSE REDUCTION: This exam was performed according to the departmental dose-optimization program which includes automated exposure control, adjustment of the  mA and/or kV according to patient size and/or use of iterative reconstruction technique. COMPARISON:  07/24/2021 CTs and prior studies FINDINGS: CT HEAD FINDINGS Brain: No evidence of acute infarction, hemorrhage, hydrocephalus, extra-axial collection or mass lesion/mass effect. Atrophy and chronic small-vessel white matter ischemic changes again noted. Vascular: Carotid and vertebral atherosclerotic calcifications are noted. Skull: Normal. Negative for fracture or focal lesion. Sinuses/Orbits: No acute finding. RIGHT mastoid effusion again noted. The middle and inner ears are clear. Other: None CT CERVICAL SPINE FINDINGS Decreased sensitivity in the mid-LOWER cervical spine again noted secondary to artifact from patient body habitus. Alignment: Normal. Skull base and vertebrae: No acute fracture. No primary bone lesion or focal pathologic process. Soft tissues and spinal canal: No prevertebral fluid or swelling. No visible canal hematoma. Disc levels: Multilevel degenerative disc disease and facet arthropathy within the cervical spine again noted and unchanged. Along with disc bulges, these findings contribute to the following which are unchanged: C3-4: Mild central spinal and moderate/severe bilateral bony foraminal narrowing C4-5: Mild central spinal and moderate RIGHT and moderate/severe LEFT bony foraminal narrowing C5-6: With OPLL, mild to moderate central spinal and mild to moderate bony biforaminal narrowing C6-7: With OPLL, mild to moderate central spinal and moderate bony biforaminal narrowing Upper chest: No acute abnormality.  Other: None IMPRESSION: 1. No evidence of acute intracranial abnormality. Atrophy and chronic small-vessel white matter ischemic changes. 2. No static evidence of acute injury to the cervical spine. Multilevel degenerative changes contributing to central spinal and bony biforaminal narrowing as described above, unchanged from 07/24/2021. Electronically Signed   By: Margarette Canada M.D.    On: 09/09/2021 17:15     Subjective: No complaints  Discharge Exam: Vitals:   09/10/21 2028 09/11/21 0613  BP: (!) 143/66 (!) 166/76  Pulse: 78 77  Resp: 18 18  Temp: 98.8 F (37.1 C) 98.2 F (36.8 C)  SpO2: 92% 94%   Vitals:   09/10/21 1017 09/10/21 1517 09/10/21 2028 09/11/21 0613  BP:  136/60 (!) 143/66 (!) 166/76  Pulse:  79 78 77  Resp:  18 18 18   Temp:  98.2 F (36.8 C) 98.8 F (37.1 C) 98.2 F (36.8 C)  TempSrc:  Oral Oral Oral  SpO2:  98% 92% 94%  Weight: (!) 165.1 kg     Height: 5\' 10"  (1.778 m)       General: Pt is alert, awake, not in acute distress Cardiovascular: RRR, S1/S2 +, no rubs, no gallops Respiratory: CTA bilaterally, no wheezing, no rhonchi Abdominal: Soft, NT, ND, bowel sounds + Extremities: no edema, no cyanosis    The results of significant diagnostics from this hospitalization (including imaging, microbiology, ancillary and laboratory) are listed below for reference.     Microbiology: Recent Results (from the past 240 hour(s))  Blood culture (routine x 2)     Status: None (Preliminary result)   Collection Time: 09/09/21  6:10 PM   Specimen: BLOOD  Result Value Ref Range Status   Specimen Description   Final    BLOOD Performed at [facility] [address], [place], [place] [address]    Special Requests   Final    BOTTLES DRAWN AEROBIC AND ANAEROBIC Blood Culture results may not be optimal due to an excessive volume of blood received in culture bottles Performed at [facility] [address] [place], [place] [address]    Culture   Final    NO GROWTH 1 DAY Performed at [facility], [place] [address], [place], [place] [address]    Report Status PENDING  Incomplete  Resp Panel by RT-PCR (Flu A&B, Covid) Nasopharyngeal Swab     Status: None   Collection Time: 09/09/21  8:00 PM   Specimen: Nasopharyngeal Swab; Nasopharyngeal(NP) swabs in vial transport medium  Result Value Ref Range  Status   SARS Coronavirus 2 by RT PCR NEGATIVE NEGATIVE Final    Comment: (NOTE) SARS-CoV-2 target nucleic acids are NOT DETECTED.  The SARS-CoV-2 RNA is generally detectable in upper respiratory specimens during the acute phase of infection. The lowest concentration of SARS-CoV-2 viral copies this assay can detect is 138 copies/mL. A negative result does not preclude SARS-Cov-2 infection and should not be used as the sole basis for treatment or other patient management decisions. A negative result may occur with  improper specimen collection/handling, submission of specimen other than nasopharyngeal swab, presence of viral mutation(s) within the areas targeted by this assay, and inadequate number of viral copies(<138 copies/mL). A negative result must be combined with clinical observations, patient history, and epidemiological information. The expected result is Negative.  Fact Sheet for Patients:  EntrepreneurPulse.com.au  Fact Sheet for Healthcare Providers:  IncredibleEmployment.be  This test is no t yet approved or cleared by the [place] and  has been authorized for detection and/or diagnosis of SARS-CoV-2 by FDA under an Emergency Use Authorization (EUA). This EUA will remain  in effect (meaning this test can be used) for the duration of the COVID-19 declaration under Section 564(b)(1) of the Act, 21 U.S.C.section 360bbb-3(b)(1), unless the authorization is terminated  or revoked sooner.       Influenza A by PCR NEGATIVE NEGATIVE Final   Influenza B by PCR NEGATIVE NEGATIVE Final    Comment: (NOTE) The Xpert Xpress SARS-CoV-2/FLU/RSV plus assay is intended as an aid in the diagnosis of influenza from Nasopharyngeal swab specimens and should not be used as a sole basis for treatment. Nasal washings and aspirates are unacceptable for Xpert Xpress SARS-CoV-2/FLU/RSV testing.  Fact Sheet for  Patients: EntrepreneurPulse.com.au  Fact Sheet for Healthcare Providers: IncredibleEmployment.be  This test is not yet approved or cleared by the [place] FDA and has been authorized for detection and/or diagnosis of SARS-CoV-2 by FDA under an Emergency Use Authorization (EUA). This EUA will remain in effect (meaning this test can be used) for the duration of the COVID-19 declaration under Section 564(b)(1) of the Act, 21 U.S.C. section 360bbb-3(b)(1), unless the authorization is terminated or revoked.  Performed at [facility], [place] [address], [place], [place] [address]   Blood culture (routine x 2)     Status: None (Preliminary result)   Collection Time: 09/09/21  8:33 PM   Specimen: BLOOD  Result Value Ref Range Status   Specimen Description   Final    BLOOD BLOOD LEFT FOREARM Performed at [facility] [address], [place], [place] [address]    Special Requests   Final    BOTTLES DRAWN AEROBIC ONLY Blood Culture results may not be optimal due to an inadequate volume of blood received in culture bottles Performed at [facility] [address], [place], [place] [address]    Culture   Final    NO GROWTH 1 DAY Performed at [facility], [place] [address] [place],  [address]    Report Status PENDING  Incomplete     Labs: BNP (last 3 results) No results for input(s): BNP in the last 8760 hours. Basic Metabolic Panel: Recent Labs  Lab 09/06/21 2225 09/09/21 1629 09/10/21 0222  NA 138 142 141  K 3.7 3.8 3.0*  CL 106 104 106  CO2 23 26 26   GLUCOSE 155* 149* 134*  BUN 30* 21 23  CREATININE 1.83* 1.78* 1.87*  CALCIUM 9.1 9.4 8.5*   Liver Function Tests: Recent Labs  Lab 09/09/21 1629  AST 21  ALT 18  ALKPHOS 132*  BILITOT 0.8  PROT 8.5*  ALBUMIN 3.9   No results for input(s): LIPASE, AMYLASE in the last 168 hours. No results for input(s): AMMONIA  in the last 168 hours. CBC: Recent Labs  Lab 09/06/21 2225 09/09/21 1821 09/10/21 0222  WBC 12.7* 10.8* 11.6*  NEUTROABS  --  9.0*  --   HGB 10.8* 8.8* 10.0*  HCT 33.9* 27.1* 30.1*  MCV 88.5 88.3 86.5  PLT 385 307 340   Cardiac Enzymes: Recent Labs  Lab 09/09/21 1629  CKTOTAL 76   BNP: Invalid input(s): POCBNP CBG: No results for input(s): GLUCAP in the last 168 hours. D-Dimer No results for input(s): DDIMER in the last 72 hours. Hgb A1c No results for input(s): HGBA1C in the last 72 hours. Lipid Profile No results for input(s): CHOL, HDL, LDLCALC, TRIG, CHOLHDL, LDLDIRECT in the last 72  hours. Thyroid function studies No results for input(s): TSH, T4TOTAL, T3FREE, THYROIDAB in the last 72 hours.  Invalid input(s): FREET3 Anemia work up No results for input(s): VITAMINB12, FOLATE, FERRITIN, TIBC, IRON, RETICCTPCT in the last 72 hours. Urinalysis    Component Value Date/Time   COLORURINE YELLOW 09/09/2021 [place] 09/09/2021 1602   LABSPEC 1.009 09/09/2021 1602   PHURINE 6.0 09/09/2021 1602   GLUCOSEU NEGATIVE 09/09/2021 1602   HGBUR NEGATIVE 09/09/2021 1602   HGBUR negative 01/04/2010 1409   BILIRUBINUR NEGATIVE 09/09/2021 1602   KETONESUR NEGATIVE 09/09/2021 1602   PROTEINUR NEGATIVE 09/09/2021 1602   UROBILINOGEN 0.2 03/30/2012 2227   NITRITE NEGATIVE 09/09/2021 1602   LEUKOCYTESUR NEGATIVE 09/09/2021 1602   Sepsis Labs Invalid input(s): PROCALCITONIN,  WBC,  LACTICIDVEN Microbiology Recent Results (from the past 240 hour(s))  Blood culture (routine x 2)     Status: None (Preliminary result)   Collection Time: 09/09/21  6:10 PM   Specimen: BLOOD  Result Value Ref Range Status   Specimen Description   Final    BLOOD Performed at [facility], [place] [address], [place], [place] [address]    Special Requests   Final    BOTTLES DRAWN AEROBIC AND ANAEROBIC Blood Culture results may not be optimal due to an excessive  volume of blood received in culture bottles Performed at [facility] [address] [place], [place] [address]    Culture   Final    NO GROWTH 1 DAY Performed at [facility], [place] [address], [place], [place] [address]    Report Status PENDING  Incomplete  Resp Panel by RT-PCR (Flu A&B, Covid) Nasopharyngeal Swab     Status: None   Collection Time: 09/09/21  8:00 PM   Specimen: Nasopharyngeal Swab; Nasopharyngeal(NP) swabs in vial transport medium  Result Value Ref Range Status   SARS Coronavirus 2 by RT PCR NEGATIVE NEGATIVE Final    Comment: (NOTE) SARS-CoV-2 target nucleic acids are NOT DETECTED.  The SARS-CoV-2 RNA is generally detectable in upper respiratory specimens during the acute phase of infection. The lowest concentration of SARS-CoV-2 viral copies this assay can detect is 138 copies/mL. A negative result does not preclude SARS-Cov-2 infection and should not be used as the sole basis for treatment or other patient management decisions. A negative result may occur with  improper specimen collection/handling, submission of specimen other than nasopharyngeal swab, presence of viral mutation(s) within the areas targeted by this assay, and inadequate number of viral copies(<138 copies/mL). A negative result must be combined with clinical observations, patient history, and epidemiological information. The expected result is Negative.  Fact Sheet for Patients:  EntrepreneurPulse.com.au  Fact Sheet for Healthcare Providers:  IncredibleEmployment.be  This test is no t yet approved or cleared by the [place] FDA and  has been authorized for detection and/or diagnosis of SARS-CoV-2 by FDA under an Emergency Use Authorization (EUA). This EUA will remain  in effect (meaning this test can be used) for the duration of the COVID-19 declaration under Section 564(b)(1) of the Act, 21 U.S.C.section 360bbb-3(b)(1),  unless the authorization is terminated  or revoked sooner.       Influenza A by PCR NEGATIVE NEGATIVE Final   Influenza B by PCR NEGATIVE NEGATIVE Final    Comment: (NOTE) The Xpert Xpress SARS-CoV-2/FLU/RSV plus assay is intended as an aid in the diagnosis of influenza from Nasopharyngeal swab specimens and should not be used as a sole basis for  treatment. Nasal washings and aspirates are unacceptable for Xpert Xpress SARS-CoV-2/FLU/RSV testing.  Fact Sheet for Patients: EntrepreneurPulse.com.au  Fact Sheet for Healthcare Providers: IncredibleEmployment.be  This test is not yet approved or cleared by the [place] FDA and has been authorized for detection and/or diagnosis of SARS-CoV-2 by FDA under an Emergency Use Authorization (EUA). This EUA will remain in effect (meaning this test can be used) for the duration of the COVID-19 declaration under Section 564(b)(1) of the Act, 21 U.S.C. section 360bbb-3(b)(1), unless the authorization is terminated or revoked.  Performed at [facility], [place] [address], [place], [place] [address]   Blood culture (routine x 2)     Status: None (Preliminary result)   Collection Time: 09/09/21  8:33 PM   Specimen: BLOOD  Result Value Ref Range Status   Specimen Description   Final    BLOOD BLOOD LEFT FOREARM Performed at [facility] [address] [place], [place] [address]    Special Requests   Final    BOTTLES DRAWN AEROBIC ONLY Blood Culture results may not be optimal due to an inadequate volume of blood received in culture bottles Performed at [facility] [address] [place], [place] [address]    Culture   Final    NO GROWTH 1 DAY Performed at [facility], [place] [address], [place], [place] [address]    Report Status PENDING  Incomplete     SIGNED:   Charlynne Cousins, MD  [facility] 09/11/2021, 8:39  AM Pager   If 7PM-7AM, please contact night-coverage [email or web address] Password [email or web address]

## 2021-09-11 NOTE — Plan of Care (Signed)
  Problem: Activity: Goal: Ability to tolerate increased activity will improve Outcome: Progressing   Problem: Clinical Measurements: Goal: Ability to maintain a body temperature in the normal range will improve Outcome: Progressing   Problem: Respiratory: Goal: Ability to maintain adequate ventilation will improve Outcome: Progressing Goal: Ability to maintain a clear airway will improve Outcome: Progressing   

## 2021-09-11 NOTE — Progress Notes (Signed)
OT Cancellation Note  Patient Details Name: Brian Silva MRN: [id number] DOB: 02-22-1941   Cancelled Treatment:    Reason Eval/Treat Not Completed: Patient declined, no reason specified Patient told therapist " leave and close the door behind you". OT to continue to follow and check back as schedule will allow.  [name] OTR/L, [facility] Office# [phone] Pager# [phone]  09/11/2021, 8:30 AM

## 2021-09-11 NOTE — Evaluation (Signed)
Physical Therapy Evaluation Patient Details Name: Brian Silva MRN: [id number] DOB: 09/24/1940 Today's Date: 09/11/2021  History of Present Illness  patient is a 81 year old male who presented to the hospital with difficulty standing after leaving SNF. patient was admitted with right lower lobe pneumonia, and weakness. PMH: HTN, diastolic heart failure, CKD II, cervical radiculopathy with parastheia 4th 5th digits, morbid obesity.  Clinical Impression  The patient reports having difficulty ambulating after returning home from rehab.Patient stood at bedside and took a few steps along bed edge. Patient may benefit from rehab to return to [facility]' Pt admitted with above diagnosis.  Pt currently with functional limitations due to the deficits listed below (see PT Problem List). Pt will benefit from skilled PT to increase their independence and safety with mobility to allow discharge to the venue listed below.            Recommendations for follow up therapy are one component of a multi-disciplinary discharge planning process, led by the attending physician.  Recommendations may be updated based on patient status, additional functional criteria and insurance authorization.  Follow Up Recommendations Skilled nursing-short term rehab (<3 hours/day)    Assistance Recommended at Discharge Other (comment)  Patient can return home with the following  A lot of help with bathing/dressing/bathroom;Two people to help with walking and/or transfers;Help with stairs or ramp for entrance;Assist for transportation;Assistance with cooking/housework    Equipment Recommendations None recommended by PT  Recommendations for Other Services       Functional Status Assessment Patient has had a recent decline in their functional status and demonstrates the ability to make significant improvements in function in a reasonable and predictable amount of time.     Precautions / Restrictions  Precautions Precautions: Fall Precaution Comments: numbness 4th and 5th digits bilaterally with history of cervical radiculopathy. Restrictions Weight Bearing Restrictions: No      Mobility  Bed Mobility Overal bed mobility: Needs Assistance Bed Mobility: Supine to Sit, Sit to Supine     Supine to sit: Min assist, HOB elevated Sit to supine: Max assist   General bed mobility comments: patient needed physical assistance to get BLE back onto bed with increased time.    Transfers Overall transfer level: Needs assistance Equipment used: Rolling walker (2 wheels) Transfers: Sit to/from Stand Sit to Stand: Min assist, +2 safety/equipment, From elevated surface           General transfer comment: patient rises  from raised bed x 2, small side steps along bed    Ambulation/Gait                  Stairs            Wheelchair Mobility    Modified Rankin (Stroke Patients Only)       Balance Overall balance assessment: Needs assistance Sitting-balance support: No upper extremity supported, Feet supported Sitting balance-Leahy Scale: Fair     Standing balance support: Reliant on assistive device for balance, Bilateral upper extremity supported, During functional activity Standing balance-Leahy Scale: Poor Standing balance comment: stands briefly                             Pertinent Vitals/Pain Pain Assessment Faces Pain Scale: Hurts little more Pain Location: bilateral knees with standing Pain Descriptors / Indicators: Discomfort, Grimacing, Guarding Pain Intervention(s): Monitored during session    Home Living Family/patient expects to be discharged to:: Skilled nursing facility  Additional Comments: patient has a history of being at a skilled nursing facility in the past per chart review.    Prior Function Prior Level of Function : Needs assist             Mobility Comments: patient reported being able to  walk with a walker prior to signing AMA out of [facility]. ADLs Comments: patient reported " i wasnt going anywhere so i kept to socks on for two days". patient was unable to reprot how he was doing a rehab prior to d/c home. no family or friends in room at this time. patient did report living in a recliner at home.     Hand Dominance   Dominant Hand: Right    Extremity/Trunk Assessment   Upper Extremity Assessment Upper Extremity Assessment: RUE deficits/detail;LUE deficits/detail RUE Deficits / Details: numbness 4th and 5th digits bilaterally with history of cervical radiculopathy    Lower Extremity Assessment Lower Extremity Assessment: Generalized weakness    Cervical / Trunk Assessment Cervical / Trunk Assessment: Normal  Communication   Communication: No difficulties  Cognition Arousal/Alertness: Awake/alert Behavior During Therapy: WFL for tasks assessed/performed Overall Cognitive Status: Difficult to assess                                 General Comments: patient was noted to have some difficulty with reports of his baseline for ADLs at rehab.patient reports that he was having a panic attack        General Comments General comments (skin integrity, edema, etc.): patient reported having a "panic attack" each time he exerts himself. O2 saturation was 97% on RA and HR was 88 bpm at time of this onset. patient returned to bed.    Exercises     Assessment/Plan    PT Assessment Patient needs continued PT services  PT Problem List Decreased strength;Pain;Decreased mobility;Decreased activity tolerance       PT Treatment Interventions DME instruction;Therapeutic activities;Gait training;Therapeutic exercise;Patient/family education;Functional mobility training    PT Goals (Current goals can be found in the Care Plan section)  Acute Rehab PT Goals Patient Stated Goal: to walk, take care of myself PT Goal Formulation: With patient Time For Goal  Achievement: 09/25/21 Potential to Achieve Goals: Fair    Frequency Min 2X/week     Co-evaluation PT/OT/SLP Co-Evaluation/Treatment: Yes Reason for Co-Treatment: Complexity of the patient's impairments (multi-system involvement);Necessary to address cognition/behavior during functional activity;To address functional/ADL transfers PT goals addressed during session: Mobility/safety with mobility OT goals addressed during session: ADL's and self-care       AM-PAC PT "6 Clicks" Mobility  Outcome Measure Help needed turning from your back to your side while in a flat bed without using bedrails?: A Lot Help needed moving from lying on your back to sitting on the side of a flat bed without using bedrails?: Total Help needed moving to and from a bed to a chair (including a wheelchair)?: A Lot Help needed standing up from a chair using your arms (e.g., wheelchair or bedside chair)?: A Lot Help needed to walk in hospital room?: Total Help needed climbing 3-5 steps with a railing? : Total 6 Click Score: 9    End of Session Equipment Utilized During Treatment: Gait belt Activity Tolerance: Patient limited by pain Patient left: in bed;with call bell/phone within reach;with bed alarm set Nurse Communication: Mobility status PT Visit Diagnosis: Muscle weakness (generalized) (M62.81);Difficulty in walking, not elsewhere classified (R26.2)  Time: 4461-9012 PT Time Calculation (min) (ACUTE ONLY): 18 min   Charges:   PT Evaluation $PT Eval Low Complexity: [facility] Pager [phone] Office [phone]   Claretha Cooper 09/11/2021, 12:52 PM

## 2021-09-12 DIAGNOSIS — D649 Anemia, unspecified: Secondary | ICD-10-CM | POA: Diagnosis not present

## 2021-09-12 DIAGNOSIS — R5381 Other malaise: Secondary | ICD-10-CM | POA: Diagnosis not present

## 2021-09-12 DIAGNOSIS — F32A Depression, unspecified: Secondary | ICD-10-CM | POA: Diagnosis not present

## 2021-09-12 DIAGNOSIS — N182 Chronic kidney disease, stage 2 (mild): Secondary | ICD-10-CM | POA: Diagnosis not present

## 2021-09-12 DIAGNOSIS — J9610 Chronic respiratory failure, unspecified whether with hypoxia or hypercapnia: Secondary | ICD-10-CM | POA: Diagnosis not present

## 2021-09-12 DIAGNOSIS — Z23 Encounter for immunization: Secondary | ICD-10-CM | POA: Diagnosis not present

## 2021-09-12 DIAGNOSIS — I517 Cardiomegaly: Secondary | ICD-10-CM | POA: Diagnosis not present

## 2021-09-12 DIAGNOSIS — E785 Hyperlipidemia, unspecified: Secondary | ICD-10-CM | POA: Diagnosis not present

## 2021-09-12 DIAGNOSIS — F410 Panic disorder [episodic paroxysmal anxiety] without agoraphobia: Secondary | ICD-10-CM | POA: Diagnosis not present

## 2021-09-12 DIAGNOSIS — J45909 Unspecified asthma, uncomplicated: Secondary | ICD-10-CM | POA: Diagnosis not present

## 2021-09-12 DIAGNOSIS — M6281 Muscle weakness (generalized): Secondary | ICD-10-CM | POA: Diagnosis not present

## 2021-09-12 DIAGNOSIS — M25569 Pain in unspecified knee: Secondary | ICD-10-CM | POA: Diagnosis not present

## 2021-09-12 DIAGNOSIS — Z20822 Contact with and (suspected) exposure to covid-19: Secondary | ICD-10-CM | POA: Diagnosis not present

## 2021-09-12 DIAGNOSIS — J301 Allergic rhinitis due to pollen: Secondary | ICD-10-CM | POA: Diagnosis not present

## 2021-09-12 DIAGNOSIS — E119 Type 2 diabetes mellitus without complications: Secondary | ICD-10-CM | POA: Diagnosis not present

## 2021-09-12 DIAGNOSIS — Z7409 Other reduced mobility: Secondary | ICD-10-CM | POA: Diagnosis not present

## 2021-09-12 DIAGNOSIS — E559 Vitamin D deficiency, unspecified: Secondary | ICD-10-CM | POA: Diagnosis not present

## 2021-09-12 DIAGNOSIS — M4722 Other spondylosis with radiculopathy, cervical region: Secondary | ICD-10-CM | POA: Diagnosis not present

## 2021-09-12 DIAGNOSIS — J189 Pneumonia, unspecified organism: Secondary | ICD-10-CM | POA: Diagnosis not present

## 2021-09-12 DIAGNOSIS — Z6841 Body Mass Index (BMI) 40.0 and over, adult: Secondary | ICD-10-CM | POA: Diagnosis not present

## 2021-09-12 DIAGNOSIS — M79643 Pain in unspecified hand: Secondary | ICD-10-CM | POA: Diagnosis not present

## 2021-09-12 DIAGNOSIS — I872 Venous insufficiency (chronic) (peripheral): Secondary | ICD-10-CM | POA: Diagnosis not present

## 2021-09-12 DIAGNOSIS — E118 Type 2 diabetes mellitus with unspecified complications: Secondary | ICD-10-CM | POA: Diagnosis not present

## 2021-09-12 DIAGNOSIS — N179 Acute kidney failure, unspecified: Secondary | ICD-10-CM | POA: Diagnosis not present

## 2021-09-12 DIAGNOSIS — I5032 Chronic diastolic (congestive) heart failure: Secondary | ICD-10-CM | POA: Diagnosis not present

## 2021-09-12 DIAGNOSIS — R29898 Other symptoms and signs involving the musculoskeletal system: Secondary | ICD-10-CM | POA: Diagnosis not present

## 2021-09-12 DIAGNOSIS — Z7401 Bed confinement status: Secondary | ICD-10-CM | POA: Diagnosis not present

## 2021-09-12 DIAGNOSIS — R262 Difficulty in walking, not elsewhere classified: Secondary | ICD-10-CM | POA: Diagnosis not present

## 2021-09-12 DIAGNOSIS — E46 Unspecified protein-calorie malnutrition: Secondary | ICD-10-CM | POA: Diagnosis not present

## 2021-09-12 DIAGNOSIS — M5412 Radiculopathy, cervical region: Secondary | ICD-10-CM | POA: Diagnosis not present

## 2021-09-12 DIAGNOSIS — G894 Chronic pain syndrome: Secondary | ICD-10-CM | POA: Diagnosis not present

## 2021-09-12 DIAGNOSIS — I10 Essential (primary) hypertension: Secondary | ICD-10-CM | POA: Diagnosis not present

## 2021-09-12 DIAGNOSIS — R627 Adult failure to thrive: Secondary | ICD-10-CM | POA: Diagnosis not present

## 2021-09-12 DIAGNOSIS — R0602 Shortness of breath: Secondary | ICD-10-CM | POA: Diagnosis not present

## 2021-09-12 DIAGNOSIS — E1140 Type 2 diabetes mellitus with diabetic neuropathy, unspecified: Secondary | ICD-10-CM | POA: Diagnosis not present

## 2021-09-12 DIAGNOSIS — F339 Major depressive disorder, recurrent, unspecified: Secondary | ICD-10-CM | POA: Diagnosis not present

## 2021-09-12 DIAGNOSIS — R531 Weakness: Secondary | ICD-10-CM | POA: Diagnosis not present

## 2021-09-12 DIAGNOSIS — G63 Polyneuropathy in diseases classified elsewhere: Secondary | ICD-10-CM | POA: Diagnosis not present

## 2021-09-12 DIAGNOSIS — G4733 Obstructive sleep apnea (adult) (pediatric): Secondary | ICD-10-CM | POA: Diagnosis not present

## 2021-09-12 DIAGNOSIS — Z1159 Encounter for screening for other viral diseases: Secondary | ICD-10-CM | POA: Diagnosis not present

## 2021-09-12 DIAGNOSIS — I440 Atrioventricular block, first degree: Secondary | ICD-10-CM | POA: Diagnosis not present

## 2021-09-12 MED ORDER — POTASSIUM CHLORIDE 20 MEQ PO PACK
40.0000 meq | PACK | Freq: Two times a day (BID) | ORAL | Status: AC
  Administered 2021-09-12 (×2): 40 meq via ORAL
  Filled 2021-09-12 (×2): qty 2

## 2021-09-12 NOTE — Discharge Summary (Signed)
Physician Discharge Summary  Brian Silva HYI:[id number] DOB: 08-Jul-1940 DOA: 09/09/2021  PCP: Benito Mccreedy, MD  Admit date: 09/09/2021 Discharge date: 09/12/2021  Admitted From: Home Disposition:  SNf  Recommendations for Outpatient Follow-up:  Follow up with PCP in 1-2 weeks Please obtain BMP/CBC in one week   Home Health:Yes Equipment/Devices:None  Discharge Condition:Stable CODE STATUS:Full Diet recommendation: Heart Healthy   Brief/Interim Summary: 81 y.o. male past medical history significant for essential hypertension, chronic diastolic heart failure, type 2 diabetes mellitus, chronic kidney disease stage II morbid obesity comes in for worsening weakness and inability to care for herself, it is to note that she left [facility] from rehab on 09/05/2021 comes back in today for worsening weakness stating unable to get up to walk to the bathroom, with decreased oral intake reportedly not able to get take his meals, in the ED apparently was covered in feces.  Labs reveal a white count of 10 lactic acid of 2 chest x-ray was concerning of right lower lobe patchy infiltrate started on Rocephin and azithromycin  Discharge Diagnoses:  Principal Problem:   Weakness Active Problems:   Community acquired pneumonia   OBESITY, MORBID   Essential hypertension   Diabetes mellitus type II, non insulin dependent (HCC)   Cervical radiculopathy  Right lower lobe community-acquired pneumonia: He was started empirically on IV Rocephin and azithromycin he defervesced initially was on 2 L of oxygen was weaned to room air. He was transitioned to oral Omnicef and azithromycin which she will continue as an outpatient for 4 more days. Physical therapy was consulted as well as Occupational Therapy he will need skilled nursing rehab. With no events overnight.  Generalized weakness: Multiple times he left AMA from the hospital in from the facility. He cannot take care of himself at  home this is likely multifactorial in the setting of morbid obesity, chronic deconditioning and now with new community-acquired pneumonia.  Cervical radiculopathy: Chronic CT unchanged from previous.  Diabetes mellitus type 2: No change made to his medication continue current regimen.  Essential hypertension: No changes made to his medication. Discharge Instructions  Discharge Instructions     Diet - low sodium heart healthy   Complete by: As directed    Increase activity slowly   Complete by: As directed       Allergies as of 09/12/2021   No Known Allergies      Medication List     TAKE these medications    acetaminophen 500 MG tablet Commonly known as: TYLENOL Take 2 tablets (1,000 mg total) by mouth every 8 (eight) hours. What changed:  when to take this reasons to take this   albuterol 108 (90 Base) MCG/ACT inhaler Commonly known as: VENTOLIN HFA Inhale 2 puffs into the lungs every 6 (six) hours as needed. For shortness of breath   amLODipine 10 MG tablet Commonly known as: NORVASC Take 10 mg by mouth daily.   aspirin EC 81 MG tablet Take 81 mg by mouth daily.   atorvastatin 20 MG tablet Commonly known as: LIPITOR Take 20 mg by mouth at bedtime.   azithromycin 250 MG tablet Commonly known as: ZITHROMAX daily   benzocaine-menthol 6-10 MG lozenge Commonly known as: CHLORAEPTIC Take 1 lozenge by mouth every 2 (two) hours as needed for sore throat.   Biofreeze 4 % Gel Generic drug: Menthol (Topical Analgesic) Apply 1 application topically every 6 (six) hours as needed (joint pain).   cefdinir 300 MG capsule Commonly known as: OMNICEF Take  1 capsule (300 mg total) by mouth every 12 (twelve) hours for 3 days.   docusate sodium 100 MG capsule Commonly known as: COLACE Take 100 mg by mouth 2 (two) times daily as needed (for constipation).   DULoxetine 20 MG capsule Commonly known as: CYMBALTA Take 20 mg by mouth daily.   folic acid 1 MG  tablet Commonly known as: FOLVITE Take 1 tablet (1 mg total) by mouth daily.   furosemide 40 MG tablet Commonly known as: LASIX Take 40 mg by mouth 2 (two) times daily.   ipratropium-albuterol 0.5-2.5 (3) MG/3ML Soln Commonly known as: DUONEB Take 3 mLs by nebulization every 6 (six) hours as needed for shortness of breath or wheezing.   nystatin powder Commonly known as: MYCOSTATIN/NYSTOP Apply 1 application topically See admin instructions. Apply to abdominal folds twice daily for redness or rash   ondansetron 4 MG tablet Commonly known as: ZOFRAN Take 4 mg by mouth every 8 (eight) hours as needed for nausea or vomiting.   pantoprazole 20 MG tablet Commonly known as: PROTONIX Take 20 mg by mouth daily.   polyethylene glycol 17 g packet Commonly known as: MIRALAX / GLYCOLAX Take 17 g by mouth daily as needed for mild constipation.   pregabalin 75 MG capsule Commonly known as: LYRICA Take 75 mg by mouth 3 (three) times daily.   traMADol 50 MG tablet Commonly known as: ULTRAM Take 1 tablet (50 mg total) by mouth every 8 (eight) hours as needed for moderate pain.   vitamin B-12 500 MCG tablet Commonly known as: CYANOCOBALAMIN Take 1 tablet (500 mcg total) by mouth daily.   Vitamin D3 125 MCG (5000 UT) Caps Take 5,000 Units by mouth daily.       No Known Allergies  Consultations: None   Procedures/Studies: DG Chest 2 View  Result Date: 09/09/2021 CLINICAL DATA:  Altered mental status. EXAM: CHEST - 2 VIEW COMPARISON:  Chest x-ray 09/06/2021 FINDINGS: There are patchy airspace opacities in the right lung base. There is no pleural effusion or pneumothorax. The cardiac silhouette is mildly enlarged unchanged. No acute fractures. IMPRESSION: 1. Patchy airspace opacity in the right lung base worrisome for infection. Recommend follow-up chest x-ray in 4-6 weeks to confirm resolution. Electronically Signed   By: Ronney Asters M.D.   On: 09/09/2021 17:33   DG Chest 2  View  Result Date: 09/06/2021 CLINICAL DATA:  Dyspnea on exertion. EXAM: CHEST - 2 VIEW COMPARISON:  Chest x-ray 07/06/2020 FINDINGS: The heart is enlarged, unchanged. There are minimal patchy opacities in the left lung base. There is no pleural effusion or pneumothorax. No acute fracture. IMPRESSION: 1.  Minimal left basilar atelectasis/airspace disease 2.  Stable cardiomegaly. Electronically Signed   By: Ronney Asters M.D.   On: 09/06/2021 23:51   CT HEAD WO CONTRAST (5MM)  Result Date: 09/09/2021 CLINICAL DATA:  81 year old male with altered mental status, neck pain and arm numbness. EXAM: CT HEAD WITHOUT CONTRAST CT CERVICAL SPINE WITHOUT CONTRAST TECHNIQUE: Multidetector CT imaging of the head and cervical spine was performed following the standard protocol without intravenous contrast. Multiplanar CT image reconstructions of the cervical spine were also generated. RADIATION DOSE REDUCTION: This exam was performed according to the departmental dose-optimization program which includes automated exposure control, adjustment of the mA and/or kV according to patient size and/or use of iterative reconstruction technique. COMPARISON:  07/24/2021 CTs and prior studies FINDINGS: CT HEAD FINDINGS Brain: No evidence of acute infarction, hemorrhage, hydrocephalus, extra-axial collection or mass lesion/mass effect.  Atrophy and chronic small-vessel white matter ischemic changes again noted. Vascular: Carotid and vertebral atherosclerotic calcifications are noted. Skull: Normal. Negative for fracture or focal lesion. Sinuses/Orbits: No acute finding. RIGHT mastoid effusion again noted. The middle and inner ears are clear. Other: None CT CERVICAL SPINE FINDINGS Decreased sensitivity in the mid-LOWER cervical spine again noted secondary to artifact from patient body habitus. Alignment: Normal. Skull base and vertebrae: No acute fracture. No primary bone lesion or focal pathologic process. Soft tissues and spinal canal:  No prevertebral fluid or swelling. No visible canal hematoma. Disc levels: Multilevel degenerative disc disease and facet arthropathy within the cervical spine again noted and unchanged. Along with disc bulges, these findings contribute to the following which are unchanged: C3-4: Mild central spinal and moderate/severe bilateral bony foraminal narrowing C4-5: Mild central spinal and moderate RIGHT and moderate/severe LEFT bony foraminal narrowing C5-6: With OPLL, mild to moderate central spinal and mild to moderate bony biforaminal narrowing C6-7: With OPLL, mild to moderate central spinal and moderate bony biforaminal narrowing Upper chest: No acute abnormality. Other: None IMPRESSION: 1. No evidence of acute intracranial abnormality. Atrophy and chronic small-vessel white matter ischemic changes. 2. No static evidence of acute injury to the cervical spine. Multilevel degenerative changes contributing to central spinal and bony biforaminal narrowing as described above, unchanged from 07/24/2021. Electronically Signed   By: Margarette Canada M.D.   On: 09/09/2021 17:15   CT Cervical Spine Wo Contrast  Result Date: 09/09/2021 CLINICAL DATA:  81 year old male with altered mental status, neck pain and arm numbness. EXAM: CT HEAD WITHOUT CONTRAST CT CERVICAL SPINE WITHOUT CONTRAST TECHNIQUE: Multidetector CT imaging of the head and cervical spine was performed following the standard protocol without intravenous contrast. Multiplanar CT image reconstructions of the cervical spine were also generated. RADIATION DOSE REDUCTION: This exam was performed according to the departmental dose-optimization program which includes automated exposure control, adjustment of the mA and/or kV according to patient size and/or use of iterative reconstruction technique. COMPARISON:  07/24/2021 CTs and prior studies FINDINGS: CT HEAD FINDINGS Brain: No evidence of acute infarction, hemorrhage, hydrocephalus, extra-axial collection or mass  lesion/mass effect. Atrophy and chronic small-vessel white matter ischemic changes again noted. Vascular: Carotid and vertebral atherosclerotic calcifications are noted. Skull: Normal. Negative for fracture or focal lesion. Sinuses/Orbits: No acute finding. RIGHT mastoid effusion again noted. The middle and inner ears are clear. Other: None CT CERVICAL SPINE FINDINGS Decreased sensitivity in the mid-LOWER cervical spine again noted secondary to artifact from patient body habitus. Alignment: Normal. Skull base and vertebrae: No acute fracture. No primary bone lesion or focal pathologic process. Soft tissues and spinal canal: No prevertebral fluid or swelling. No visible canal hematoma. Disc levels: Multilevel degenerative disc disease and facet arthropathy within the cervical spine again noted and unchanged. Along with disc bulges, these findings contribute to the following which are unchanged: C3-4: Mild central spinal and moderate/severe bilateral bony foraminal narrowing C4-5: Mild central spinal and moderate RIGHT and moderate/severe LEFT bony foraminal narrowing C5-6: With OPLL, mild to moderate central spinal and mild to moderate bony biforaminal narrowing C6-7: With OPLL, mild to moderate central spinal and moderate bony biforaminal narrowing Upper chest: No acute abnormality. Other: None IMPRESSION: 1. No evidence of acute intracranial abnormality. Atrophy and chronic small-vessel white matter ischemic changes. 2. No static evidence of acute injury to the cervical spine. Multilevel degenerative changes contributing to central spinal and bony biforaminal narrowing as described above, unchanged from 07/24/2021. Electronically Signed   By: Dellis Filbert  Hu M.D.   On: 09/09/2021 17:15     Subjective: No complaints  Discharge Exam: Vitals:   09/11/21 2037 09/12/21 0553  BP: 131/60 140/63  Pulse: 70 73  Resp: 19 19  Temp: 98.6 F (37 C) 98.3 F (36.8 C)  SpO2: 97% 93%   Vitals:   09/11/21 0906 09/11/21  1252 09/11/21 2037 09/12/21 0553  BP: (!) 125/58 (!) 138/49 131/60 140/63  Pulse: 67 82 70 73  Resp:  18 19 19   Temp:  (!) 97.5 F (36.4 C) 98.6 F (37 C) 98.3 F (36.8 C)  TempSrc:   Axillary Oral  SpO2: 96% 96% 97% 93%  Weight:      Height:        General: Pt is alert, awake, not in acute distress Cardiovascular: RRR, S1/S2 +, no rubs, no gallops Respiratory: CTA bilaterally, no wheezing, no rhonchi Abdominal: Soft, NT, ND, bowel sounds + Extremities: no edema, no cyanosis    The results of significant diagnostics from this hospitalization (including imaging, microbiology, ancillary and laboratory) are listed below for reference.     Microbiology: Recent Results (from the past 240 hour(s))  Blood culture (routine x 2)     Status: None (Preliminary result)   Collection Time: 09/09/21  6:10 PM   Specimen: BLOOD  Result Value Ref Range Status   Specimen Description   Final    BLOOD Performed at [facility] [address], [place], [place] [address]    Special Requests   Final    BOTTLES DRAWN AEROBIC AND ANAEROBIC Blood Culture results may not be optimal due to an excessive volume of blood received in culture bottles Performed at [facility] [address], [place], [place] [address]    Culture  Setup Time   Final    GRAM POSITIVE RODS AEROBIC BOTTLE ONLY CRITICAL RESULT CALLED TO, READ BACK BY AND VERIFIED WITH: PHARMD JIGNA GADHIA ON 09/11/21 @ 1542 BY DRT Performed at [facility], [place] [address], [place], [place] [address]    Culture GRAM POSITIVE RODS  Final   Report Status PENDING  Incomplete  Resp Panel by RT-PCR (Flu A&B, Covid) Nasopharyngeal Swab     Status: None   Collection Time: 09/09/21  8:00 PM   Specimen: Nasopharyngeal Swab; Nasopharyngeal(NP) swabs in vial transport medium  Result Value Ref Range Status   SARS Coronavirus 2 by RT PCR NEGATIVE NEGATIVE Final    Comment: (NOTE) SARS-CoV-2 target  nucleic acids are NOT DETECTED.  The SARS-CoV-2 RNA is generally detectable in upper respiratory specimens during the acute phase of infection. The lowest concentration of SARS-CoV-2 viral copies this assay can detect is 138 copies/mL. A negative result does not preclude SARS-Cov-2 infection and should not be used as the sole basis for treatment or other patient management decisions. A negative result may occur with  improper specimen collection/handling, submission of specimen other than nasopharyngeal swab, presence of viral mutation(s) within the areas targeted by this assay, and inadequate number of viral copies(<138 copies/mL). A negative result must be combined with clinical observations, patient history, and epidemiological information. The expected result is Negative.  Fact Sheet for Patients:  EntrepreneurPulse.com.au  Fact Sheet for Healthcare Providers:  IncredibleEmployment.be  This test is no t yet approved or cleared by the [place] FDA and  has been authorized for detection and/or diagnosis of SARS-CoV-2 by FDA under an Emergency Use Authorization (EUA). This EUA will remain  in effect (meaning this test  can be used) for the duration of the COVID-19 declaration under Section 564(b)(1) of the Act, 21 U.S.C.section 360bbb-3(b)(1), unless the authorization is terminated  or revoked sooner.       Influenza A by PCR NEGATIVE NEGATIVE Final   Influenza B by PCR NEGATIVE NEGATIVE Final    Comment: (NOTE) The Xpert Xpress SARS-CoV-2/FLU/RSV plus assay is intended as an aid in the diagnosis of influenza from Nasopharyngeal swab specimens and should not be used as a sole basis for treatment. Nasal washings and aspirates are unacceptable for Xpert Xpress SARS-CoV-2/FLU/RSV testing.  Fact Sheet for Patients: EntrepreneurPulse.com.au  Fact Sheet for Healthcare  Providers: IncredibleEmployment.be  This test is not yet approved or cleared by the [place] FDA and has been authorized for detection and/or diagnosis of SARS-CoV-2 by FDA under an Emergency Use Authorization (EUA). This EUA will remain in effect (meaning this test can be used) for the duration of the COVID-19 declaration under Section 564(b)(1) of the Act, 21 U.S.C. section 360bbb-3(b)(1), unless the authorization is terminated or revoked.  Performed at [facility], [place] [address], [place], [place] [address]   Blood culture (routine x 2)     Status: None (Preliminary result)   Collection Time: 09/09/21  8:33 PM   Specimen: BLOOD  Result Value Ref Range Status   Specimen Description   Final    BLOOD BLOOD LEFT FOREARM Performed at [facility] [address] [place], [place] [address]    Special Requests   Final    BOTTLES DRAWN AEROBIC ONLY Blood Culture results may not be optimal due to an inadequate volume of blood received in culture bottles Performed at [facility] [address] [place], [place] [address]    Culture   Final    NO GROWTH 1 DAY Performed at [facility], [place] [address], [place], [place] [address]    Report Status PENDING  Incomplete     Labs: BNP (last 3 results) No results for input(s): BNP in the last 8760 hours. Basic Metabolic Panel: Recent Labs  Lab 09/06/21 2225 09/09/21 1629 09/10/21 0222  NA 138 142 141  K 3.7 3.8 3.0*  CL 106 104 106  CO2 23 26 26   GLUCOSE 155* 149* 134*  BUN 30* 21 23  CREATININE 1.83* 1.78* 1.87*  CALCIUM 9.1 9.4 8.5*   Liver Function Tests: Recent Labs  Lab 09/09/21 1629  AST 21  ALT 18  ALKPHOS 132*  BILITOT 0.8  PROT 8.5*  ALBUMIN 3.9   No results for input(s): LIPASE, AMYLASE in the last 168 hours. No results for input(s): AMMONIA in the last 168 hours. CBC: Recent Labs  Lab 09/06/21 2225 09/09/21 1821  09/10/21 0222  WBC 12.7* 10.8* 11.6*  NEUTROABS  --  9.0*  --   HGB 10.8* 8.8* 10.0*  HCT 33.9* 27.1* 30.1*  MCV 88.5 88.3 86.5  PLT 385 307 340   Cardiac Enzymes: Recent Labs  Lab 09/09/21 1629  CKTOTAL 76   BNP: Invalid input(s): POCBNP CBG: No results for input(s): GLUCAP in the last 168 hours. D-Dimer No results for input(s): DDIMER in the last 72 hours. Hgb A1c No results for input(s): HGBA1C in the last 72 hours. Lipid Profile No results for input(s): CHOL, HDL, LDLCALC, TRIG, CHOLHDL, LDLDIRECT in the last 72 hours. Thyroid function studies No results for input(s): TSH, T4TOTAL, T3FREE, THYROIDAB in the last 72 hours.  Invalid input(s): FREET3 Anemia work up No results for  input(s): VITAMINB12, FOLATE, FERRITIN, TIBC, IRON, RETICCTPCT in the last 72 hours. Urinalysis    Component Value Date/Time   COLORURINE YELLOW 09/09/2021 [place] 09/09/2021 1602   LABSPEC 1.009 09/09/2021 1602   PHURINE 6.0 09/09/2021 1602   GLUCOSEU NEGATIVE 09/09/2021 1602   HGBUR NEGATIVE 09/09/2021 1602   HGBUR negative 01/04/2010 1409   BILIRUBINUR NEGATIVE 09/09/2021 1602   KETONESUR NEGATIVE 09/09/2021 1602   PROTEINUR NEGATIVE 09/09/2021 1602   UROBILINOGEN 0.2 03/30/2012 2227   NITRITE NEGATIVE 09/09/2021 1602   LEUKOCYTESUR NEGATIVE 09/09/2021 1602   Sepsis Labs Invalid input(s): PROCALCITONIN,  WBC,  LACTICIDVEN Microbiology Recent Results (from the past 240 hour(s))  Blood culture (routine x 2)     Status: None (Preliminary result)   Collection Time: 09/09/21  6:10 PM   Specimen: BLOOD  Result Value Ref Range Status   Specimen Description   Final    BLOOD Performed at [facility], [place] [address] [place], [place] [address]    Special Requests   Final    BOTTLES DRAWN AEROBIC AND ANAEROBIC Blood Culture results may not be optimal due to an excessive volume of blood received in culture bottles Performed at [facility] [address] [place], [place] [address]    Culture  Setup Time   Final    GRAM POSITIVE RODS AEROBIC BOTTLE ONLY CRITICAL RESULT CALLED TO, READ BACK BY AND VERIFIED WITH: PHARMD JIGNA GADHIA ON 09/11/21 @ 1542 BY DRT Performed at [facility], [place] [address] [place], [place] [address]    Culture GRAM POSITIVE RODS  Final   Report Status PENDING  Incomplete  Resp Panel by RT-PCR (Flu A&B, Covid) Nasopharyngeal Swab     Status: None   Collection Time: 09/09/21  8:00 PM   Specimen: Nasopharyngeal Swab; Nasopharyngeal(NP) swabs in vial transport medium  Result Value Ref Range Status   SARS Coronavirus 2 by RT PCR NEGATIVE NEGATIVE Final    Comment: (NOTE) SARS-CoV-2 target nucleic acids are NOT DETECTED.  The SARS-CoV-2 RNA is generally detectable in upper respiratory specimens during the acute phase of infection. The lowest concentration of SARS-CoV-2 viral copies this assay can detect is 138 copies/mL. A negative result does not preclude SARS-Cov-2 infection and should not be used as the sole basis for treatment or other patient management decisions. A negative result may occur with  improper specimen collection/handling, submission of specimen other than nasopharyngeal swab, presence of viral mutation(s) within the areas targeted by this assay, and inadequate number of viral copies(<138 copies/mL). A negative result must be combined with clinical observations, patient history, and epidemiological information. The expected result is Negative.  Fact Sheet for Patients:  EntrepreneurPulse.com.au  Fact Sheet for Healthcare Providers:  IncredibleEmployment.be  This test is no t yet approved or cleared by the [place] FDA and  has been authorized for detection and/or diagnosis of SARS-CoV-2 by FDA under an Emergency Use Authorization (EUA). This EUA will remain  in effect (meaning this test can be used) for the duration of  the COVID-19 declaration under Section 564(b)(1) of the Act, 21 U.S.C.section 360bbb-3(b)(1), unless the authorization is terminated  or revoked sooner.       Influenza A by PCR NEGATIVE NEGATIVE Final   Influenza B by PCR NEGATIVE NEGATIVE Final    Comment: (NOTE) The Xpert Xpress SARS-CoV-2/FLU/RSV plus assay is intended as an aid in the diagnosis of influenza from Nasopharyngeal swab specimens and should not be used  as a sole basis for treatment. Nasal washings and aspirates are unacceptable for Xpert Xpress SARS-CoV-2/FLU/RSV testing.  Fact Sheet for Patients: EntrepreneurPulse.com.au  Fact Sheet for Healthcare Providers: IncredibleEmployment.be  This test is not yet approved or cleared by the [place] FDA and has been authorized for detection and/or diagnosis of SARS-CoV-2 by FDA under an Emergency Use Authorization (EUA). This EUA will remain in effect (meaning this test can be used) for the duration of the COVID-19 declaration under Section 564(b)(1) of the Act, 21 U.S.C. section 360bbb-3(b)(1), unless the authorization is terminated or revoked.  Performed at [facility], [place] [address] [place], [place] [address]   Blood culture (routine x 2)     Status: None (Preliminary result)   Collection Time: 09/09/21  8:33 PM   Specimen: BLOOD  Result Value Ref Range Status   Specimen Description   Final    BLOOD BLOOD LEFT FOREARM Performed at [facility] [address] [place], [place] [address]    Special Requests   Final    BOTTLES DRAWN AEROBIC ONLY Blood Culture results may not be optimal due to an inadequate volume of blood received in culture bottles Performed at [facility] [address], [place], [place] [address]    Culture   Final    NO GROWTH 1 DAY Performed at [facility], [place] [address], [place], [place] [address]    Report Status PENDING   Incomplete     SIGNED:   Charlynne Cousins, MD  [facility] 09/12/2021, 7:55 AM Pager   If 7PM-7AM, please contact night-coverage [email or web address] Password [email or web address]

## 2021-09-12 NOTE — Care Management Obs Status (Addendum)
[facility] NOTIFICATION   Patient Details  Name: Brian Silva MRN: [id number] Date of Birth: Feb 12, 1941   Medicare Observation Status Notification Given:  Yes    Ross Ludwig, LCSW 09/12/2021, 2:55 PM

## 2021-09-12 NOTE — Progress Notes (Signed)
Called report to [employer] to [place], Marine scientist.

## 2021-09-12 NOTE — TOC Transition Note (Addendum)
Transition of Care [facility]) - CM/SW Discharge Note   Patient Details  Name: Brian Silva MRN: [id number] Date of Birth: 1940/10/14  Transition of Care [facility]) CM/SW Contact:  Ross Ludwig, LCSW Phone Number: 09/12/2021, 2:10 PM   Clinical Narrative:     CSW was informed by Juliann Pulse at [facility] that patient just discharged last week, and he can return if he would like to under his Medicare benefits.    Patient did not leave AMA from SNF, he felt like he was doing well enough to go home, but once he got home he realized he needed some more therapy.  CSW confirmed this with SNF.    CSW spoke to patient to discuss his options are going to SNF again or returning back home with home health.  Patient stated he realizes he needs more rehab first before he can return back home.  Patient stated he is agreeable to going.  CSW updated attending physician and bedside nurse.  Patient to be d/c'ed today to [facility] room 124B.  Patient and family agreeable to plans will transport via ems RN to call report to [phone].    Final next level of care: Skilled Nursing Facility Barriers to Discharge: Barriers Resolved   Patient Goals and CMS Choice Patient states their goals for this hospitalization and ongoing recovery are:: To return to SNF and get short term rehab. CMS Medicare.gov Compare Post Acute Care list provided to:: Patient Choice offered to / list presented to : Patient  Discharge Placement   Existing PASRR number confirmed : 09/11/21          Patient chooses bed at: [facility] Patient to be transferred to facility by: [facility]   Patient and family notified of of transfer: 09/12/21  Discharge Plan and Services                                     Social Determinants of Health (SDOH) Interventions     Readmission Risk Interventions     View : No data to display.

## 2021-09-12 NOTE — Plan of Care (Addendum)
Pt being discharged to SNF. [facility]. Problem: Activity: Goal: Ability to tolerate increased activity will improve Outcome: Completed/Met

## 2021-09-12 NOTE — Progress Notes (Signed)
Went over discharge instructions w/ pt. Pt verbalized understanding.  

## 2021-09-13 DIAGNOSIS — I10 Essential (primary) hypertension: Secondary | ICD-10-CM | POA: Diagnosis not present

## 2021-09-13 DIAGNOSIS — M25569 Pain in unspecified knee: Secondary | ICD-10-CM | POA: Diagnosis not present

## 2021-09-13 DIAGNOSIS — M6281 Muscle weakness (generalized): Secondary | ICD-10-CM | POA: Diagnosis not present

## 2021-09-13 DIAGNOSIS — G894 Chronic pain syndrome: Secondary | ICD-10-CM | POA: Diagnosis not present

## 2021-09-13 DIAGNOSIS — E118 Type 2 diabetes mellitus with unspecified complications: Secondary | ICD-10-CM | POA: Diagnosis not present

## 2021-09-13 DIAGNOSIS — R262 Difficulty in walking, not elsewhere classified: Secondary | ICD-10-CM | POA: Diagnosis not present

## 2021-09-13 DIAGNOSIS — R5381 Other malaise: Secondary | ICD-10-CM | POA: Diagnosis not present

## 2021-09-13 DIAGNOSIS — I5032 Chronic diastolic (congestive) heart failure: Secondary | ICD-10-CM | POA: Diagnosis not present

## 2021-09-13 DIAGNOSIS — M79643 Pain in unspecified hand: Secondary | ICD-10-CM | POA: Diagnosis not present

## 2021-09-14 DIAGNOSIS — I5032 Chronic diastolic (congestive) heart failure: Secondary | ICD-10-CM | POA: Diagnosis not present

## 2021-09-14 DIAGNOSIS — Z6841 Body Mass Index (BMI) 40.0 and over, adult: Secondary | ICD-10-CM | POA: Diagnosis not present

## 2021-09-14 DIAGNOSIS — I872 Venous insufficiency (chronic) (peripheral): Secondary | ICD-10-CM | POA: Diagnosis not present

## 2021-09-14 DIAGNOSIS — I10 Essential (primary) hypertension: Secondary | ICD-10-CM | POA: Diagnosis not present

## 2021-09-15 DIAGNOSIS — I5032 Chronic diastolic (congestive) heart failure: Secondary | ICD-10-CM | POA: Diagnosis not present

## 2021-09-15 DIAGNOSIS — I10 Essential (primary) hypertension: Secondary | ICD-10-CM | POA: Diagnosis not present

## 2021-09-15 DIAGNOSIS — E785 Hyperlipidemia, unspecified: Secondary | ICD-10-CM | POA: Diagnosis not present

## 2021-09-15 DIAGNOSIS — G4733 Obstructive sleep apnea (adult) (pediatric): Secondary | ICD-10-CM | POA: Diagnosis not present

## 2021-09-15 DIAGNOSIS — F339 Major depressive disorder, recurrent, unspecified: Secondary | ICD-10-CM | POA: Diagnosis not present

## 2021-09-15 DIAGNOSIS — J301 Allergic rhinitis due to pollen: Secondary | ICD-10-CM | POA: Diagnosis not present

## 2021-09-15 DIAGNOSIS — N182 Chronic kidney disease, stage 2 (mild): Secondary | ICD-10-CM | POA: Diagnosis not present

## 2021-09-15 DIAGNOSIS — M6281 Muscle weakness (generalized): Secondary | ICD-10-CM | POA: Diagnosis not present

## 2021-09-15 DIAGNOSIS — D649 Anemia, unspecified: Secondary | ICD-10-CM | POA: Diagnosis not present

## 2021-09-15 DIAGNOSIS — Z1159 Encounter for screening for other viral diseases: Secondary | ICD-10-CM | POA: Diagnosis not present

## 2021-09-15 DIAGNOSIS — M4722 Other spondylosis with radiculopathy, cervical region: Secondary | ICD-10-CM | POA: Diagnosis not present

## 2021-09-15 LAB — CULTURE, BLOOD (ROUTINE X 2): Culture: NO GROWTH

## 2021-09-20 DIAGNOSIS — R29898 Other symptoms and signs involving the musculoskeletal system: Secondary | ICD-10-CM | POA: Diagnosis not present

## 2021-09-20 DIAGNOSIS — G894 Chronic pain syndrome: Secondary | ICD-10-CM | POA: Diagnosis not present

## 2021-09-20 DIAGNOSIS — E1140 Type 2 diabetes mellitus with diabetic neuropathy, unspecified: Secondary | ICD-10-CM | POA: Diagnosis not present

## 2021-09-20 DIAGNOSIS — M6281 Muscle weakness (generalized): Secondary | ICD-10-CM | POA: Diagnosis not present

## 2021-09-20 DIAGNOSIS — Z7409 Other reduced mobility: Secondary | ICD-10-CM | POA: Diagnosis not present

## 2021-09-27 ENCOUNTER — Other Ambulatory Visit: Payer: Self-pay | Admitting: *Deleted

## 2021-09-27 DIAGNOSIS — Z7409 Other reduced mobility: Secondary | ICD-10-CM | POA: Diagnosis not present

## 2021-09-27 DIAGNOSIS — N179 Acute kidney failure, unspecified: Secondary | ICD-10-CM | POA: Diagnosis not present

## 2021-09-27 DIAGNOSIS — N182 Chronic kidney disease, stage 2 (mild): Secondary | ICD-10-CM | POA: Diagnosis not present

## 2021-09-27 DIAGNOSIS — R29898 Other symptoms and signs involving the musculoskeletal system: Secondary | ICD-10-CM | POA: Diagnosis not present

## 2021-09-27 DIAGNOSIS — G894 Chronic pain syndrome: Secondary | ICD-10-CM | POA: Diagnosis not present

## 2021-09-27 DIAGNOSIS — E1140 Type 2 diabetes mellitus with diabetic neuropathy, unspecified: Secondary | ICD-10-CM | POA: Diagnosis not present

## 2021-09-27 NOTE — Patient Outreach (Signed)
THN Post- Acute Care Coordinator follow up. Per [place] eligible member resides in [facility].  Screened for potential [facility] Care Management as a benefit of member's insurance plan.  Facility site visit to [facility] skilled nursing facility. Met with Marita Kansas, SNF SW concerning member's progress, transition plan and potential THN needs. Marita Kansas reports member is adamant to return home. ALF has been recommended by therapy. Mr. Harty lives alone. Has had frequent admissions.   Spoke with Mr. Gearheart in room at [facility] SNF to discuss transition plans. Mr. Pruett was resting. Mr. Youngberg tells Probation officer he is returning home not ALF. Reports Pam is his support person. Pam works during the day. Discussed THN services. Mr. Denardo is agreeable. Also discussed [facility] services. Mr. Wiederholt agreeable to Probation officer making referral to [facility] as well.  Will continue to follow. Will plan to make referral to [facility]   Confirmed best contact number for Mr. Gros as [phone].  Will continue to follow while member resides in [facility].    [name], MSN, RN,BSN [place] Acute Care Coordinator [phone] [facility]) [phone]  (Toll free office)

## 2021-10-11 ENCOUNTER — Other Ambulatory Visit: Payer: Self-pay | Admitting: *Deleted

## 2021-10-11 NOTE — Patient Outreach (Incomplete)
THN Post- Acute Care Coordinator follow up. Per [place] eligible member resides in  ***.  Screened for potential [facility] Care Management services/ [facility] care coordination services as a benefit of member's insurance plan.  ***Member's PCP at *** has [facility] care management services available. ***   **** admitted to SNF on **** after hospitalization****.  Facility site visit to **** skilled nursing facility. Met with **** concerning member's progress, transition plan, and potential THN needs. Anticipated transition plan is ***   Spoke with ***  in room at *** SNF to discuss transition plans and [facility] Care Management services/ THN follow up.  Confirmed PCP is Dr. Marland Kitchen with *** Confirmed best contact number for *** is ***  Member's PCP at *** has [facility] care coordination team available if needed. CCM services would need to be ordered by PCP.  Provided [facility] Care Management brochure, 24-hr nurse advice line magnet, and writer's contact information.   Will continue to collaborate with SNF SW and follow transition plans/needs while member resides in SNF.

## 2021-10-12 DIAGNOSIS — F410 Panic disorder [episodic paroxysmal anxiety] without agoraphobia: Secondary | ICD-10-CM | POA: Diagnosis not present

## 2021-10-12 DIAGNOSIS — Z7409 Other reduced mobility: Secondary | ICD-10-CM | POA: Diagnosis not present

## 2021-10-12 DIAGNOSIS — R0602 Shortness of breath: Secondary | ICD-10-CM | POA: Diagnosis not present

## 2021-10-12 DIAGNOSIS — R29898 Other symptoms and signs involving the musculoskeletal system: Secondary | ICD-10-CM | POA: Diagnosis not present

## 2021-10-13 DIAGNOSIS — G63 Polyneuropathy in diseases classified elsewhere: Secondary | ICD-10-CM | POA: Diagnosis not present

## 2021-10-13 DIAGNOSIS — Z7409 Other reduced mobility: Secondary | ICD-10-CM | POA: Diagnosis not present

## 2021-10-13 DIAGNOSIS — E1140 Type 2 diabetes mellitus with diabetic neuropathy, unspecified: Secondary | ICD-10-CM | POA: Diagnosis not present

## 2021-10-13 DIAGNOSIS — R29898 Other symptoms and signs involving the musculoskeletal system: Secondary | ICD-10-CM | POA: Diagnosis not present

## 2021-10-18 ENCOUNTER — Other Ambulatory Visit: Payer: Self-pay | Admitting: *Deleted

## 2021-10-18 NOTE — Patient Outreach (Signed)

## 2021-10-19 DIAGNOSIS — D649 Anemia, unspecified: Secondary | ICD-10-CM | POA: Diagnosis not present

## 2021-10-19 DIAGNOSIS — I10 Essential (primary) hypertension: Secondary | ICD-10-CM | POA: Diagnosis not present

## 2021-10-19 DIAGNOSIS — G63 Polyneuropathy in diseases classified elsewhere: Secondary | ICD-10-CM | POA: Diagnosis not present

## 2021-10-19 DIAGNOSIS — M6281 Muscle weakness (generalized): Secondary | ICD-10-CM | POA: Diagnosis not present

## 2021-10-19 DIAGNOSIS — N182 Chronic kidney disease, stage 2 (mild): Secondary | ICD-10-CM | POA: Diagnosis not present

## 2021-10-19 DIAGNOSIS — F339 Major depressive disorder, recurrent, unspecified: Secondary | ICD-10-CM | POA: Diagnosis not present

## 2021-10-19 DIAGNOSIS — I5032 Chronic diastolic (congestive) heart failure: Secondary | ICD-10-CM | POA: Diagnosis not present

## 2021-10-19 DIAGNOSIS — M4722 Other spondylosis with radiculopathy, cervical region: Secondary | ICD-10-CM | POA: Diagnosis not present

## 2021-10-19 DIAGNOSIS — G894 Chronic pain syndrome: Secondary | ICD-10-CM | POA: Diagnosis not present

## 2021-10-19 DIAGNOSIS — G4733 Obstructive sleep apnea (adult) (pediatric): Secondary | ICD-10-CM | POA: Diagnosis not present

## 2021-10-19 DIAGNOSIS — Z7409 Other reduced mobility: Secondary | ICD-10-CM | POA: Diagnosis not present

## 2021-10-22 ENCOUNTER — Other Ambulatory Visit: Payer: Self-pay

## 2021-10-22 ENCOUNTER — Emergency Department (HOSPITAL_COMMUNITY)
Admission: EM | Admit: 2021-10-22 | Discharge: 2021-10-22 | Disposition: A | Discharge status: Home / Self Care | Payer: Medicare Other | Attending: Emergency Medicine | Admitting: Emergency Medicine

## 2021-10-22 ENCOUNTER — Encounter (HOSPITAL_COMMUNITY): Payer: Self-pay | Admitting: Emergency Medicine

## 2021-10-22 DIAGNOSIS — F410 Panic disorder [episodic paroxysmal anxiety] without agoraphobia: Secondary | ICD-10-CM | POA: Insufficient documentation

## 2021-10-22 DIAGNOSIS — R064 Hyperventilation: Secondary | ICD-10-CM | POA: Diagnosis not present

## 2021-10-22 DIAGNOSIS — Z743 Need for continuous supervision: Secondary | ICD-10-CM | POA: Diagnosis not present

## 2021-10-22 DIAGNOSIS — Z7982 Long term (current) use of aspirin: Secondary | ICD-10-CM | POA: Diagnosis not present

## 2021-10-22 DIAGNOSIS — R457 State of emotional shock and stress, unspecified: Secondary | ICD-10-CM | POA: Diagnosis not present

## 2021-10-22 DIAGNOSIS — F419 Anxiety disorder, unspecified: Secondary | ICD-10-CM | POA: Diagnosis not present

## 2021-10-22 DIAGNOSIS — Z659 Problem related to unspecified psychosocial circumstances: Secondary | ICD-10-CM | POA: Diagnosis not present

## 2021-10-22 DIAGNOSIS — R202 Paresthesia of skin: Secondary | ICD-10-CM | POA: Diagnosis not present

## 2021-10-22 LAB — CBC WITH DIFFERENTIAL/PLATELET
Abs Immature Granulocytes: 0.06 10*3/uL (ref 0.00–0.07)
Basophils Absolute: 0.1 10*3/uL (ref 0.0–0.1)
Basophils Relative: 1 %
Eosinophils Absolute: 0.1 10*3/uL (ref 0.0–0.5)
Eosinophils Relative: 1 %
HCT: 38 % — ABNORMAL LOW (ref 39.0–52.0)
Hemoglobin: 12.2 g/dL — ABNORMAL LOW (ref 13.0–17.0)
Immature Granulocytes: 1 %
Lymphocytes Relative: 12 %
Lymphs Abs: 1.3 10*3/uL (ref 0.7–4.0)
MCH: 27.9 pg (ref 26.0–34.0)
MCHC: 32.1 g/dL (ref 30.0–36.0)
MCV: 86.8 fL (ref 80.0–100.0)
Monocytes Absolute: 0.8 10*3/uL (ref 0.1–1.0)
Monocytes Relative: 7 %
Neutro Abs: 8.5 10*3/uL — ABNORMAL HIGH (ref 1.7–7.7)
Neutrophils Relative %: 78 %
Platelets: 435 10*3/uL — ABNORMAL HIGH (ref 150–400)
RBC: 4.38 MIL/uL (ref 4.22–5.81)
RDW: 13.9 % (ref 11.5–15.5)
WBC: 10.8 10*3/uL — ABNORMAL HIGH (ref 4.0–10.5)
nRBC: 0 % (ref 0.0–0.2)

## 2021-10-22 LAB — COMPREHENSIVE METABOLIC PANEL
ALT: 17 U/L (ref 0–44)
AST: 21 U/L (ref 15–41)
Albumin: 3.8 g/dL (ref 3.5–5.0)
Alkaline Phosphatase: 120 U/L (ref 38–126)
Anion gap: 14 (ref 5–15)
BUN: 24 mg/dL — ABNORMAL HIGH (ref 8–23)
CO2: 23 mmol/L (ref 22–32)
Calcium: 9.4 mg/dL (ref 8.9–10.3)
Chloride: 105 mmol/L (ref 98–111)
Creatinine, Ser: 1.77 mg/dL — ABNORMAL HIGH (ref 0.61–1.24)
GFR, Estimated: 39 mL/min — ABNORMAL LOW (ref >60)
Glucose, Bld: 157 mg/dL — ABNORMAL HIGH (ref 70–99)
Potassium: 3.7 mmol/L (ref 3.5–5.1)
Sodium: 142 mmol/L (ref 135–145)
Total Bilirubin: 0.9 mg/dL (ref 0.3–1.2)
Total Protein: 7.8 g/dL (ref 6.5–8.1)

## 2021-10-22 NOTE — ED Notes (Signed)
Patient refused to be discharge, "patient stated I can't go home, I need to be admitted. RN explain they is medical reason for him to be admitted". Patient stated I can't go home. I can't leave with her. Charge RN informed.

## 2021-10-22 NOTE — Discharge Instructions (Signed)
You were seen in the emergency department for complaint of anxiety and trouble with your home situation.  You had lab work done that did not show any significant abnormalities.  There is no indication for admission to the hospital.  Social work said that they cannot place you back into a skilled nursing facility.  You will need to return home and follow-up with your primary care doctor.

## 2021-10-22 NOTE — ED Notes (Signed)
[facility] unable to take patient home because patient able to ambulate and a/ox4.

## 2021-10-22 NOTE — ED Notes (Signed)
RN called [facility] to take patient home. Charge RN informed.
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
## 2021-10-22 NOTE — Progress Notes (Signed)
Patient is still in triage and has not been assigned a room or nurse. CSW tried to call patient but patient did not pick-up. CSW informed charge nurse and ED provider that patient would need to be admitted to qualify for SNF to have a 3 night qualifying stay. CSW also contacted [facility] SNF to find out if patient left AMA or if patient assumed he could take care of himself at home. Patient has a history of leaving his ling term care facilities thinking he can care for himself at home. Patient also didn't want to give up his apartment or money. CSW has provided patient with in home care provider resources in the past that he can pay privately and PCS through medicaid.

## 2021-10-22 NOTE — Progress Notes (Signed)
CSW will not be able to provide any SNF placement for patient in the ED. Patient has Medicare A/B for insurance and would need to be admitted into the hospital for a 3 night qualifying stay. Patient would have to discharge back home if ED provider does not admit patient.

## 2021-10-22 NOTE — ED Notes (Signed)
Patient be able to stand up and transfer to a wheelchair. Will bring pt to the lobby.

## 2021-10-22 NOTE — ED Notes (Signed)
Patient refused to be discharge. Patient stated he is unable to walk, weak and unable to do anything for himself.
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
## 2021-10-23 ENCOUNTER — Other Ambulatory Visit: Payer: Self-pay | Admitting: *Deleted
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
## 2021-10-25 ENCOUNTER — Other Ambulatory Visit: Payer: Self-pay | Admitting: *Deleted

## 2021-10-25 DIAGNOSIS — I5032 Chronic diastolic (congestive) heart failure: Secondary | ICD-10-CM
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
## 2021-10-25 NOTE — Addendum Note (Signed)
Addended by: Harless Litten on: 10/25/2021 04:19 PM   Modules accepted: Orders

## 2021-10-26 ENCOUNTER — Other Ambulatory Visit: Payer: Self-pay | Admitting: *Deleted

## 2021-10-26 NOTE — Patient Outreach (Signed)
[facility]) Care Management  10/26/2021  KA FLAMMER [birth year] [id number]   Received hospital referral from [name], RN for Care management per note request pt be assigned to Deloria Lair, RN Care Coordinator. Sent for follow up.   [place] Management Assistant [phone]

## 2021-10-26 NOTE — Patient Outreach (Addendum)
[facility]N Post- Acute Care Coordinator follow up.  Writer went to Brian Silva residence today to assist him with getting ready for [facility] transportation. [place] transportation arrangements made behalf of [place]. Brian Silva was slated to arrive at [facility] by 2 pm today for admission for long term care.   Upon arrival to residence, Brian Silva was laying in stool in his hospital bed. Brian Silva is able to ambulate with walker but elected not to go to the bathroom prior. Writer assisted Brian Silva with cleaning up and putting on clothes. Also put belongings in bag to take to [facility].  [facility] transportation services arrived by 1pm in wheel chair van with chair lift. Arrived to [place] by 2 pm. Brian Silva admitted to room 106B at [facility] along with his keys, belongings, and cell phone with charger. Meca with Admissions was at bedside to do paperwork with Brian Silva. Brian Silva expressed appreciation of the assistance and being able to be placed. Brian Silva states he does not plan on leaving as he has in the past.   Called APS to make aware Brian Silva admitted to [facility] today for LTC.  Shortly after, Probation officer received telephone call from Brian Silva. Brian Silva  states she actually visited Brian Silva at his home on Tuesday, July 4th. APS is following. Brian Silva states she will follow up with Brian Silva at [facility] and will discuss with him about contacting [place] regarding his apartment.  Writer signing off.  No further [facility] Post-Acute Care Coordinator needs assessed.     [name], MSN, RN,BSN [place] Acute Care Coordinator [phone] [facility]) [phone]  (Toll free office)

## 2021-10-27 ENCOUNTER — Other Ambulatory Visit: Payer: Self-pay | Admitting: *Deleted

## 2021-10-27 DIAGNOSIS — G63 Polyneuropathy in diseases classified elsewhere: Secondary | ICD-10-CM | POA: Diagnosis not present

## 2021-10-27 DIAGNOSIS — Z741 Need for assistance with personal care: Secondary | ICD-10-CM | POA: Diagnosis not present

## 2021-10-27 DIAGNOSIS — N182 Chronic kidney disease, stage 2 (mild): Secondary | ICD-10-CM | POA: Diagnosis not present

## 2021-10-27 DIAGNOSIS — F410 Panic disorder [episodic paroxysmal anxiety] without agoraphobia: Secondary | ICD-10-CM | POA: Diagnosis not present

## 2021-10-27 DIAGNOSIS — M4722 Other spondylosis with radiculopathy, cervical region: Secondary | ICD-10-CM | POA: Diagnosis not present

## 2021-10-27 DIAGNOSIS — M6281 Muscle weakness (generalized): Secondary | ICD-10-CM | POA: Diagnosis not present

## 2021-10-27 DIAGNOSIS — G4733 Obstructive sleep apnea (adult) (pediatric): Secondary | ICD-10-CM | POA: Diagnosis not present

## 2021-10-27 DIAGNOSIS — R2681 Unsteadiness on feet: Secondary | ICD-10-CM | POA: Diagnosis not present

## 2021-10-27 DIAGNOSIS — R262 Difficulty in walking, not elsewhere classified: Secondary | ICD-10-CM | POA: Diagnosis not present

## 2021-10-27 DIAGNOSIS — I5032 Chronic diastolic (congestive) heart failure: Secondary | ICD-10-CM | POA: Diagnosis not present

## 2021-10-27 NOTE — Patient Outreach (Signed)
[place] Coordinator follow up.   Discussed case with Director of [place] Management, Suanne Marker. Writer advised to follow up with Mr. Gersten regarding [employer], utilities, and the like since he is going to remain at [facility] for long term care.   Facility site visit to [facility] today. Met with Mr. Armendariz at bedside. Mr. Chavarin and Probation officer contacted [employer] [phone] on speaker phone. Advised that a letter from [facility] needs to be emailed to Secondary school teacher Janelle Martinique indicating Mr. Goerner is unable to care for himself at home alone. States it has to be in writing. Contact information for [place] is [email or web address] and [phone]. Also states Mr. Ranta is responsible for getting his belongings out of the apartment.   Mr. Deutscher reports he pays for [employer]. Mr. Blatz contacted [employer] to disconnect services as of today. Mr. Kuenzi states [place] covers the [employer].   Writer participated in care plan meeting with Futures trader, business Glass blower/designer, and physical therapy, dietary, and Mr. Gear.  Writer provided events of what led up to this admission to [facility]. Also made staff aware Mr. Hatchel is unable to return home. He has unsafe home situation and will no longer have home to go to. Mr. Bethel remains agreeable to the plan of remaining at [facility] for long term care.   Writer spoke with Leia Alf, [facility] social worker. Melissa states she will follow up with Janelle Martinique at [employer] and provide necessary documentation. Melissa states she will also discuss with her Administrator about how and if they can assist with clearing out Mr. Huish apartment. Melissa states she will keep Probation officer updated. Writer also provided Melissa with APS social worker's name. Rushie Chestnut. Exchanged contact information with Melissa, SNF SW.   [name], MSN, RN,BSN [place] Acute  Care Coordinator [phone] [facility]) [phone]  (Toll free office)

## 2021-10-30 ENCOUNTER — Other Ambulatory Visit: Payer: Self-pay | Admitting: *Deleted

## 2021-10-30 DIAGNOSIS — R262 Difficulty in walking, not elsewhere classified: Secondary | ICD-10-CM | POA: Diagnosis not present

## 2021-10-30 DIAGNOSIS — I5032 Chronic diastolic (congestive) heart failure: Secondary | ICD-10-CM | POA: Diagnosis not present

## 2021-10-30 DIAGNOSIS — I10 Essential (primary) hypertension: Secondary | ICD-10-CM | POA: Diagnosis not present

## 2021-10-30 DIAGNOSIS — D649 Anemia, unspecified: Secondary | ICD-10-CM | POA: Diagnosis not present

## 2021-10-30 DIAGNOSIS — M4722 Other spondylosis with radiculopathy, cervical region: Secondary | ICD-10-CM | POA: Diagnosis not present

## 2021-10-30 DIAGNOSIS — F339 Major depressive disorder, recurrent, unspecified: Secondary | ICD-10-CM | POA: Diagnosis not present

## 2021-10-30 DIAGNOSIS — G63 Polyneuropathy in diseases classified elsewhere: Secondary | ICD-10-CM | POA: Diagnosis not present

## 2021-10-30 DIAGNOSIS — M6281 Muscle weakness (generalized): Secondary | ICD-10-CM | POA: Diagnosis not present

## 2021-10-30 DIAGNOSIS — N182 Chronic kidney disease, stage 2 (mild): Secondary | ICD-10-CM | POA: Diagnosis not present

## 2021-10-30 NOTE — Patient Outreach (Signed)
[facility]N Post- Acute Care Coordinator follow up.   Message received from [facility] SW, [place]. [place] will need to contact [facility] by phone.  Melissa states they cannot send letter to [employer] due to patient confidentiality. Melissa with [facility] also states they are unable to assist Mr. Bienvenue with retrieving his belongings from his apartment.   Telephone call made to Janelle Martinique with [place] [phone]. Made Janelle aware of the above information Leia Alf, [facility] SN[facility] SW relayed. [place] states she will contact [facility]. Probation officer provided [employer].    [name], MSN, RN,BSN [place] Acute Care Coordinator [phone] [facility]) [phone]  (Toll free office)

## 2021-10-31 ENCOUNTER — Other Ambulatory Visit: Payer: Self-pay | Admitting: *Deleted

## 2021-10-31 DIAGNOSIS — R262 Difficulty in walking, not elsewhere classified: Secondary | ICD-10-CM | POA: Diagnosis not present

## 2021-10-31 DIAGNOSIS — M4722 Other spondylosis with radiculopathy, cervical region: Secondary | ICD-10-CM | POA: Diagnosis not present

## 2021-10-31 DIAGNOSIS — G63 Polyneuropathy in diseases classified elsewhere: Secondary | ICD-10-CM | POA: Diagnosis not present

## 2021-10-31 DIAGNOSIS — M6281 Muscle weakness (generalized): Secondary | ICD-10-CM | POA: Diagnosis not present

## 2021-10-31 DIAGNOSIS — N182 Chronic kidney disease, stage 2 (mild): Secondary | ICD-10-CM | POA: Diagnosis not present

## 2021-11-01 DIAGNOSIS — N182 Chronic kidney disease, stage 2 (mild): Secondary | ICD-10-CM | POA: Diagnosis not present

## 2021-11-01 DIAGNOSIS — M6281 Muscle weakness (generalized): Secondary | ICD-10-CM | POA: Diagnosis not present

## 2021-11-01 DIAGNOSIS — G63 Polyneuropathy in diseases classified elsewhere: Secondary | ICD-10-CM | POA: Diagnosis not present

## 2021-11-01 DIAGNOSIS — R262 Difficulty in walking, not elsewhere classified: Secondary | ICD-10-CM | POA: Diagnosis not present

## 2021-11-01 DIAGNOSIS — M4722 Other spondylosis with radiculopathy, cervical region: Secondary | ICD-10-CM | POA: Diagnosis not present

## 2021-11-02 DIAGNOSIS — R262 Difficulty in walking, not elsewhere classified: Secondary | ICD-10-CM | POA: Diagnosis not present

## 2021-11-02 DIAGNOSIS — N182 Chronic kidney disease, stage 2 (mild): Secondary | ICD-10-CM | POA: Diagnosis not present

## 2021-11-02 DIAGNOSIS — M6281 Muscle weakness (generalized): Secondary | ICD-10-CM | POA: Diagnosis not present

## 2021-11-02 DIAGNOSIS — G63 Polyneuropathy in diseases classified elsewhere: Secondary | ICD-10-CM | POA: Diagnosis not present

## 2021-11-02 DIAGNOSIS — M4722 Other spondylosis with radiculopathy, cervical region: Secondary | ICD-10-CM | POA: Diagnosis not present

## 2021-11-03 DIAGNOSIS — M6281 Muscle weakness (generalized): Secondary | ICD-10-CM | POA: Diagnosis not present

## 2021-11-03 DIAGNOSIS — M4722 Other spondylosis with radiculopathy, cervical region: Secondary | ICD-10-CM | POA: Diagnosis not present

## 2021-11-03 DIAGNOSIS — N182 Chronic kidney disease, stage 2 (mild): Secondary | ICD-10-CM | POA: Diagnosis not present

## 2021-11-03 DIAGNOSIS — G63 Polyneuropathy in diseases classified elsewhere: Secondary | ICD-10-CM | POA: Diagnosis not present

## 2021-11-03 DIAGNOSIS — R262 Difficulty in walking, not elsewhere classified: Secondary | ICD-10-CM | POA: Diagnosis not present

## 2021-11-06 DIAGNOSIS — M4722 Other spondylosis with radiculopathy, cervical region: Secondary | ICD-10-CM | POA: Diagnosis not present

## 2021-11-06 DIAGNOSIS — K5900 Constipation, unspecified: Secondary | ICD-10-CM | POA: Diagnosis not present

## 2021-11-06 DIAGNOSIS — M6281 Muscle weakness (generalized): Secondary | ICD-10-CM | POA: Diagnosis not present

## 2021-11-06 DIAGNOSIS — R262 Difficulty in walking, not elsewhere classified: Secondary | ICD-10-CM | POA: Diagnosis not present

## 2021-11-06 DIAGNOSIS — E785 Hyperlipidemia, unspecified: Secondary | ICD-10-CM | POA: Diagnosis not present

## 2021-11-06 DIAGNOSIS — I10 Essential (primary) hypertension: Secondary | ICD-10-CM | POA: Diagnosis not present

## 2021-11-06 DIAGNOSIS — G63 Polyneuropathy in diseases classified elsewhere: Secondary | ICD-10-CM | POA: Diagnosis not present

## 2021-11-06 DIAGNOSIS — N182 Chronic kidney disease, stage 2 (mild): Secondary | ICD-10-CM | POA: Diagnosis not present

## 2021-11-07 DIAGNOSIS — G63 Polyneuropathy in diseases classified elsewhere: Secondary | ICD-10-CM | POA: Diagnosis not present

## 2021-11-07 DIAGNOSIS — N182 Chronic kidney disease, stage 2 (mild): Secondary | ICD-10-CM | POA: Diagnosis not present

## 2021-11-07 DIAGNOSIS — M6281 Muscle weakness (generalized): Secondary | ICD-10-CM | POA: Diagnosis not present

## 2021-11-07 DIAGNOSIS — M4722 Other spondylosis with radiculopathy, cervical region: Secondary | ICD-10-CM | POA: Diagnosis not present

## 2021-11-07 DIAGNOSIS — R262 Difficulty in walking, not elsewhere classified: Secondary | ICD-10-CM | POA: Diagnosis not present

## 2021-11-08 DIAGNOSIS — M4722 Other spondylosis with radiculopathy, cervical region: Secondary | ICD-10-CM | POA: Diagnosis not present

## 2021-11-08 DIAGNOSIS — R262 Difficulty in walking, not elsewhere classified: Secondary | ICD-10-CM | POA: Diagnosis not present

## 2021-11-08 DIAGNOSIS — G63 Polyneuropathy in diseases classified elsewhere: Secondary | ICD-10-CM | POA: Diagnosis not present

## 2021-11-08 DIAGNOSIS — M6281 Muscle weakness (generalized): Secondary | ICD-10-CM | POA: Diagnosis not present

## 2021-11-08 DIAGNOSIS — D649 Anemia, unspecified: Secondary | ICD-10-CM | POA: Diagnosis not present

## 2021-11-08 DIAGNOSIS — N182 Chronic kidney disease, stage 2 (mild): Secondary | ICD-10-CM | POA: Diagnosis not present

## 2021-11-08 DIAGNOSIS — E860 Dehydration: Secondary | ICD-10-CM | POA: Diagnosis not present

## 2021-11-09 DIAGNOSIS — R262 Difficulty in walking, not elsewhere classified: Secondary | ICD-10-CM | POA: Diagnosis not present

## 2021-11-09 DIAGNOSIS — M6281 Muscle weakness (generalized): Secondary | ICD-10-CM | POA: Diagnosis not present

## 2021-11-09 DIAGNOSIS — G63 Polyneuropathy in diseases classified elsewhere: Secondary | ICD-10-CM | POA: Diagnosis not present

## 2021-11-09 DIAGNOSIS — M4722 Other spondylosis with radiculopathy, cervical region: Secondary | ICD-10-CM | POA: Diagnosis not present

## 2021-11-09 DIAGNOSIS — N182 Chronic kidney disease, stage 2 (mild): Secondary | ICD-10-CM | POA: Diagnosis not present

## 2021-11-10 DIAGNOSIS — N182 Chronic kidney disease, stage 2 (mild): Secondary | ICD-10-CM | POA: Diagnosis not present

## 2021-11-10 DIAGNOSIS — M6281 Muscle weakness (generalized): Secondary | ICD-10-CM | POA: Diagnosis not present

## 2021-11-10 DIAGNOSIS — M4722 Other spondylosis with radiculopathy, cervical region: Secondary | ICD-10-CM | POA: Diagnosis not present

## 2021-11-10 DIAGNOSIS — R262 Difficulty in walking, not elsewhere classified: Secondary | ICD-10-CM | POA: Diagnosis not present

## 2021-11-10 DIAGNOSIS — G63 Polyneuropathy in diseases classified elsewhere: Secondary | ICD-10-CM | POA: Diagnosis not present

## 2021-11-11 DIAGNOSIS — G63 Polyneuropathy in diseases classified elsewhere: Secondary | ICD-10-CM | POA: Diagnosis not present

## 2021-11-11 DIAGNOSIS — N182 Chronic kidney disease, stage 2 (mild): Secondary | ICD-10-CM | POA: Diagnosis not present

## 2021-11-11 DIAGNOSIS — M6281 Muscle weakness (generalized): Secondary | ICD-10-CM | POA: Diagnosis not present

## 2021-11-11 DIAGNOSIS — M4722 Other spondylosis with radiculopathy, cervical region: Secondary | ICD-10-CM | POA: Diagnosis not present

## 2021-11-11 DIAGNOSIS — R262 Difficulty in walking, not elsewhere classified: Secondary | ICD-10-CM | POA: Diagnosis not present

## 2021-11-13 DIAGNOSIS — G63 Polyneuropathy in diseases classified elsewhere: Secondary | ICD-10-CM | POA: Diagnosis not present

## 2021-11-13 DIAGNOSIS — N182 Chronic kidney disease, stage 2 (mild): Secondary | ICD-10-CM | POA: Diagnosis not present

## 2021-11-13 DIAGNOSIS — M6281 Muscle weakness (generalized): Secondary | ICD-10-CM | POA: Diagnosis not present

## 2021-11-13 DIAGNOSIS — R262 Difficulty in walking, not elsewhere classified: Secondary | ICD-10-CM | POA: Diagnosis not present

## 2021-11-13 DIAGNOSIS — M4722 Other spondylosis with radiculopathy, cervical region: Secondary | ICD-10-CM | POA: Diagnosis not present

## 2021-11-13 DIAGNOSIS — N183 Chronic kidney disease, stage 3 unspecified: Secondary | ICD-10-CM | POA: Diagnosis not present

## 2021-11-14 DIAGNOSIS — R262 Difficulty in walking, not elsewhere classified: Secondary | ICD-10-CM | POA: Diagnosis not present

## 2021-11-14 DIAGNOSIS — G63 Polyneuropathy in diseases classified elsewhere: Secondary | ICD-10-CM | POA: Diagnosis not present

## 2021-11-14 DIAGNOSIS — M6281 Muscle weakness (generalized): Secondary | ICD-10-CM | POA: Diagnosis not present

## 2021-11-14 DIAGNOSIS — M4722 Other spondylosis with radiculopathy, cervical region: Secondary | ICD-10-CM | POA: Diagnosis not present

## 2021-11-14 DIAGNOSIS — N182 Chronic kidney disease, stage 2 (mild): Secondary | ICD-10-CM | POA: Diagnosis not present

## 2021-11-15 DIAGNOSIS — G63 Polyneuropathy in diseases classified elsewhere: Secondary | ICD-10-CM | POA: Diagnosis not present

## 2021-11-15 DIAGNOSIS — M4722 Other spondylosis with radiculopathy, cervical region: Secondary | ICD-10-CM | POA: Diagnosis not present

## 2021-11-15 DIAGNOSIS — N182 Chronic kidney disease, stage 2 (mild): Secondary | ICD-10-CM | POA: Diagnosis not present

## 2021-11-15 DIAGNOSIS — R262 Difficulty in walking, not elsewhere classified: Secondary | ICD-10-CM | POA: Diagnosis not present

## 2021-11-15 DIAGNOSIS — M6281 Muscle weakness (generalized): Secondary | ICD-10-CM | POA: Diagnosis not present

## 2021-11-16 DIAGNOSIS — N182 Chronic kidney disease, stage 2 (mild): Secondary | ICD-10-CM | POA: Diagnosis not present

## 2021-11-16 DIAGNOSIS — M6281 Muscle weakness (generalized): Secondary | ICD-10-CM | POA: Diagnosis not present

## 2021-11-16 DIAGNOSIS — G63 Polyneuropathy in diseases classified elsewhere: Secondary | ICD-10-CM | POA: Diagnosis not present

## 2021-11-16 DIAGNOSIS — R262 Difficulty in walking, not elsewhere classified: Secondary | ICD-10-CM | POA: Diagnosis not present

## 2021-11-16 DIAGNOSIS — M4722 Other spondylosis with radiculopathy, cervical region: Secondary | ICD-10-CM | POA: Diagnosis not present

## 2021-11-17 DIAGNOSIS — G63 Polyneuropathy in diseases classified elsewhere: Secondary | ICD-10-CM | POA: Diagnosis not present

## 2021-11-17 DIAGNOSIS — M6281 Muscle weakness (generalized): Secondary | ICD-10-CM | POA: Diagnosis not present

## 2021-11-17 DIAGNOSIS — M4722 Other spondylosis with radiculopathy, cervical region: Secondary | ICD-10-CM | POA: Diagnosis not present

## 2021-11-17 DIAGNOSIS — N182 Chronic kidney disease, stage 2 (mild): Secondary | ICD-10-CM | POA: Diagnosis not present

## 2021-11-17 DIAGNOSIS — R262 Difficulty in walking, not elsewhere classified: Secondary | ICD-10-CM | POA: Diagnosis not present

## 2021-11-18 DIAGNOSIS — R262 Difficulty in walking, not elsewhere classified: Secondary | ICD-10-CM | POA: Diagnosis not present

## 2021-11-18 DIAGNOSIS — N182 Chronic kidney disease, stage 2 (mild): Secondary | ICD-10-CM | POA: Diagnosis not present

## 2021-11-18 DIAGNOSIS — G63 Polyneuropathy in diseases classified elsewhere: Secondary | ICD-10-CM | POA: Diagnosis not present

## 2021-11-18 DIAGNOSIS — M6281 Muscle weakness (generalized): Secondary | ICD-10-CM | POA: Diagnosis not present

## 2021-11-18 DIAGNOSIS — M4722 Other spondylosis with radiculopathy, cervical region: Secondary | ICD-10-CM | POA: Diagnosis not present

## 2021-11-20 DIAGNOSIS — G63 Polyneuropathy in diseases classified elsewhere: Secondary | ICD-10-CM | POA: Diagnosis not present

## 2021-11-20 DIAGNOSIS — R262 Difficulty in walking, not elsewhere classified: Secondary | ICD-10-CM | POA: Diagnosis not present

## 2021-11-20 DIAGNOSIS — M4722 Other spondylosis with radiculopathy, cervical region: Secondary | ICD-10-CM | POA: Diagnosis not present

## 2021-11-20 DIAGNOSIS — N182 Chronic kidney disease, stage 2 (mild): Secondary | ICD-10-CM | POA: Diagnosis not present

## 2021-11-20 DIAGNOSIS — M6281 Muscle weakness (generalized): Secondary | ICD-10-CM | POA: Diagnosis not present

## 2021-11-21 DIAGNOSIS — N182 Chronic kidney disease, stage 2 (mild): Secondary | ICD-10-CM | POA: Diagnosis not present

## 2021-11-21 DIAGNOSIS — M6281 Muscle weakness (generalized): Secondary | ICD-10-CM | POA: Diagnosis not present

## 2021-11-21 DIAGNOSIS — I5032 Chronic diastolic (congestive) heart failure: Secondary | ICD-10-CM | POA: Diagnosis not present

## 2021-11-21 DIAGNOSIS — R262 Difficulty in walking, not elsewhere classified: Secondary | ICD-10-CM | POA: Diagnosis not present

## 2021-11-21 DIAGNOSIS — Z741 Need for assistance with personal care: Secondary | ICD-10-CM | POA: Diagnosis not present

## 2021-11-21 DIAGNOSIS — G63 Polyneuropathy in diseases classified elsewhere: Secondary | ICD-10-CM | POA: Diagnosis not present

## 2021-11-21 DIAGNOSIS — R2681 Unsteadiness on feet: Secondary | ICD-10-CM | POA: Diagnosis not present

## 2021-11-21 DIAGNOSIS — F410 Panic disorder [episodic paroxysmal anxiety] without agoraphobia: Secondary | ICD-10-CM | POA: Diagnosis not present

## 2021-11-21 DIAGNOSIS — M4722 Other spondylosis with radiculopathy, cervical region: Secondary | ICD-10-CM | POA: Diagnosis not present

## 2021-11-22 DIAGNOSIS — I5032 Chronic diastolic (congestive) heart failure: Secondary | ICD-10-CM | POA: Diagnosis not present

## 2021-11-22 DIAGNOSIS — M4722 Other spondylosis with radiculopathy, cervical region: Secondary | ICD-10-CM | POA: Diagnosis not present

## 2021-11-22 DIAGNOSIS — G629 Polyneuropathy, unspecified: Secondary | ICD-10-CM | POA: Diagnosis not present

## 2021-11-22 DIAGNOSIS — G63 Polyneuropathy in diseases classified elsewhere: Secondary | ICD-10-CM | POA: Diagnosis not present

## 2021-11-22 DIAGNOSIS — Z741 Need for assistance with personal care: Secondary | ICD-10-CM | POA: Diagnosis not present

## 2021-11-22 DIAGNOSIS — G894 Chronic pain syndrome: Secondary | ICD-10-CM | POA: Diagnosis not present

## 2021-11-22 DIAGNOSIS — M6281 Muscle weakness (generalized): Secondary | ICD-10-CM | POA: Diagnosis not present

## 2021-11-22 DIAGNOSIS — R262 Difficulty in walking, not elsewhere classified: Secondary | ICD-10-CM | POA: Diagnosis not present

## 2021-11-23 DIAGNOSIS — M6281 Muscle weakness (generalized): Secondary | ICD-10-CM | POA: Diagnosis not present

## 2021-11-23 DIAGNOSIS — Z741 Need for assistance with personal care: Secondary | ICD-10-CM | POA: Diagnosis not present

## 2021-11-23 DIAGNOSIS — M4722 Other spondylosis with radiculopathy, cervical region: Secondary | ICD-10-CM | POA: Diagnosis not present

## 2021-11-23 DIAGNOSIS — K219 Gastro-esophageal reflux disease without esophagitis: Secondary | ICD-10-CM | POA: Diagnosis not present

## 2021-11-23 DIAGNOSIS — E119 Type 2 diabetes mellitus without complications: Secondary | ICD-10-CM | POA: Diagnosis not present

## 2021-11-23 DIAGNOSIS — G63 Polyneuropathy in diseases classified elsewhere: Secondary | ICD-10-CM | POA: Diagnosis not present

## 2021-11-23 DIAGNOSIS — R262 Difficulty in walking, not elsewhere classified: Secondary | ICD-10-CM | POA: Diagnosis not present

## 2021-11-23 DIAGNOSIS — I5032 Chronic diastolic (congestive) heart failure: Secondary | ICD-10-CM | POA: Diagnosis not present

## 2021-11-24 DIAGNOSIS — I5032 Chronic diastolic (congestive) heart failure: Secondary | ICD-10-CM | POA: Diagnosis not present

## 2021-11-24 DIAGNOSIS — M4722 Other spondylosis with radiculopathy, cervical region: Secondary | ICD-10-CM | POA: Diagnosis not present

## 2021-11-24 DIAGNOSIS — G63 Polyneuropathy in diseases classified elsewhere: Secondary | ICD-10-CM | POA: Diagnosis not present

## 2021-11-24 DIAGNOSIS — M6281 Muscle weakness (generalized): Secondary | ICD-10-CM | POA: Diagnosis not present

## 2021-11-24 DIAGNOSIS — Z741 Need for assistance with personal care: Secondary | ICD-10-CM | POA: Diagnosis not present

## 2021-11-24 DIAGNOSIS — R262 Difficulty in walking, not elsewhere classified: Secondary | ICD-10-CM | POA: Diagnosis not present

## 2021-11-25 DIAGNOSIS — M6281 Muscle weakness (generalized): Secondary | ICD-10-CM | POA: Diagnosis not present

## 2021-11-25 DIAGNOSIS — I5032 Chronic diastolic (congestive) heart failure: Secondary | ICD-10-CM | POA: Diagnosis not present

## 2021-11-25 DIAGNOSIS — M4722 Other spondylosis with radiculopathy, cervical region: Secondary | ICD-10-CM | POA: Diagnosis not present

## 2021-11-25 DIAGNOSIS — Z741 Need for assistance with personal care: Secondary | ICD-10-CM | POA: Diagnosis not present

## 2021-11-25 DIAGNOSIS — R262 Difficulty in walking, not elsewhere classified: Secondary | ICD-10-CM | POA: Diagnosis not present

## 2021-11-25 DIAGNOSIS — G63 Polyneuropathy in diseases classified elsewhere: Secondary | ICD-10-CM | POA: Diagnosis not present

## 2021-11-27 DIAGNOSIS — G63 Polyneuropathy in diseases classified elsewhere: Secondary | ICD-10-CM | POA: Diagnosis not present

## 2021-11-27 DIAGNOSIS — I5032 Chronic diastolic (congestive) heart failure: Secondary | ICD-10-CM | POA: Diagnosis not present

## 2021-11-27 DIAGNOSIS — M4722 Other spondylosis with radiculopathy, cervical region: Secondary | ICD-10-CM | POA: Diagnosis not present

## 2021-11-27 DIAGNOSIS — M6281 Muscle weakness (generalized): Secondary | ICD-10-CM | POA: Diagnosis not present

## 2021-11-27 DIAGNOSIS — Z741 Need for assistance with personal care: Secondary | ICD-10-CM | POA: Diagnosis not present

## 2021-11-27 DIAGNOSIS — E119 Type 2 diabetes mellitus without complications: Secondary | ICD-10-CM | POA: Diagnosis not present

## 2021-11-27 DIAGNOSIS — R262 Difficulty in walking, not elsewhere classified: Secondary | ICD-10-CM | POA: Diagnosis not present

## 2021-11-28 DIAGNOSIS — I5032 Chronic diastolic (congestive) heart failure: Secondary | ICD-10-CM | POA: Diagnosis not present

## 2021-11-28 DIAGNOSIS — G63 Polyneuropathy in diseases classified elsewhere: Secondary | ICD-10-CM | POA: Diagnosis not present

## 2021-11-28 DIAGNOSIS — R2232 Localized swelling, mass and lump, left upper limb: Secondary | ICD-10-CM | POA: Diagnosis not present

## 2021-11-28 DIAGNOSIS — M6281 Muscle weakness (generalized): Secondary | ICD-10-CM | POA: Diagnosis not present

## 2021-11-28 DIAGNOSIS — Z741 Need for assistance with personal care: Secondary | ICD-10-CM | POA: Diagnosis not present

## 2021-11-28 DIAGNOSIS — M79622 Pain in left upper arm: Secondary | ICD-10-CM | POA: Diagnosis not present

## 2021-11-28 DIAGNOSIS — M4722 Other spondylosis with radiculopathy, cervical region: Secondary | ICD-10-CM | POA: Diagnosis not present

## 2021-11-28 DIAGNOSIS — R262 Difficulty in walking, not elsewhere classified: Secondary | ICD-10-CM | POA: Diagnosis not present

## 2021-11-29 DIAGNOSIS — M6281 Muscle weakness (generalized): Secondary | ICD-10-CM | POA: Diagnosis not present

## 2021-11-29 DIAGNOSIS — G63 Polyneuropathy in diseases classified elsewhere: Secondary | ICD-10-CM | POA: Diagnosis not present

## 2021-11-29 DIAGNOSIS — M4722 Other spondylosis with radiculopathy, cervical region: Secondary | ICD-10-CM | POA: Diagnosis not present

## 2021-11-29 DIAGNOSIS — R262 Difficulty in walking, not elsewhere classified: Secondary | ICD-10-CM | POA: Diagnosis not present

## 2021-11-29 DIAGNOSIS — E119 Type 2 diabetes mellitus without complications: Secondary | ICD-10-CM | POA: Diagnosis not present

## 2021-11-29 DIAGNOSIS — I5032 Chronic diastolic (congestive) heart failure: Secondary | ICD-10-CM | POA: Diagnosis not present

## 2021-11-29 DIAGNOSIS — R609 Edema, unspecified: Secondary | ICD-10-CM | POA: Diagnosis not present

## 2021-11-29 DIAGNOSIS — Z741 Need for assistance with personal care: Secondary | ICD-10-CM | POA: Diagnosis not present

## 2021-11-30 DIAGNOSIS — M6281 Muscle weakness (generalized): Secondary | ICD-10-CM | POA: Diagnosis not present

## 2021-11-30 DIAGNOSIS — G63 Polyneuropathy in diseases classified elsewhere: Secondary | ICD-10-CM | POA: Diagnosis not present

## 2021-11-30 DIAGNOSIS — R262 Difficulty in walking, not elsewhere classified: Secondary | ICD-10-CM | POA: Diagnosis not present

## 2021-11-30 DIAGNOSIS — M4722 Other spondylosis with radiculopathy, cervical region: Secondary | ICD-10-CM | POA: Diagnosis not present

## 2021-11-30 DIAGNOSIS — Z741 Need for assistance with personal care: Secondary | ICD-10-CM | POA: Diagnosis not present

## 2021-11-30 DIAGNOSIS — I5032 Chronic diastolic (congestive) heart failure: Secondary | ICD-10-CM | POA: Diagnosis not present

## 2021-12-01 DIAGNOSIS — M6281 Muscle weakness (generalized): Secondary | ICD-10-CM | POA: Diagnosis not present

## 2021-12-01 DIAGNOSIS — M4722 Other spondylosis with radiculopathy, cervical region: Secondary | ICD-10-CM | POA: Diagnosis not present

## 2021-12-01 DIAGNOSIS — Z741 Need for assistance with personal care: Secondary | ICD-10-CM | POA: Diagnosis not present

## 2021-12-01 DIAGNOSIS — G63 Polyneuropathy in diseases classified elsewhere: Secondary | ICD-10-CM | POA: Diagnosis not present

## 2021-12-01 DIAGNOSIS — E119 Type 2 diabetes mellitus without complications: Secondary | ICD-10-CM | POA: Diagnosis not present

## 2021-12-01 DIAGNOSIS — I5032 Chronic diastolic (congestive) heart failure: Secondary | ICD-10-CM | POA: Diagnosis not present

## 2021-12-01 DIAGNOSIS — R262 Difficulty in walking, not elsewhere classified: Secondary | ICD-10-CM | POA: Diagnosis not present

## 2021-12-01 DIAGNOSIS — E785 Hyperlipidemia, unspecified: Secondary | ICD-10-CM | POA: Diagnosis not present

## 2021-12-02 DIAGNOSIS — R262 Difficulty in walking, not elsewhere classified: Secondary | ICD-10-CM | POA: Diagnosis not present

## 2021-12-02 DIAGNOSIS — I5032 Chronic diastolic (congestive) heart failure: Secondary | ICD-10-CM | POA: Diagnosis not present

## 2021-12-02 DIAGNOSIS — M6281 Muscle weakness (generalized): Secondary | ICD-10-CM | POA: Diagnosis not present

## 2021-12-02 DIAGNOSIS — M4722 Other spondylosis with radiculopathy, cervical region: Secondary | ICD-10-CM | POA: Diagnosis not present

## 2021-12-02 DIAGNOSIS — Z741 Need for assistance with personal care: Secondary | ICD-10-CM | POA: Diagnosis not present

## 2021-12-02 DIAGNOSIS — G63 Polyneuropathy in diseases classified elsewhere: Secondary | ICD-10-CM | POA: Diagnosis not present

## 2021-12-04 DIAGNOSIS — Z741 Need for assistance with personal care: Secondary | ICD-10-CM | POA: Diagnosis not present

## 2021-12-04 DIAGNOSIS — M4722 Other spondylosis with radiculopathy, cervical region: Secondary | ICD-10-CM | POA: Diagnosis not present

## 2021-12-04 DIAGNOSIS — R609 Edema, unspecified: Secondary | ICD-10-CM | POA: Diagnosis not present

## 2021-12-04 DIAGNOSIS — I5032 Chronic diastolic (congestive) heart failure: Secondary | ICD-10-CM | POA: Diagnosis not present

## 2021-12-04 DIAGNOSIS — M6281 Muscle weakness (generalized): Secondary | ICD-10-CM | POA: Diagnosis not present

## 2021-12-04 DIAGNOSIS — R262 Difficulty in walking, not elsewhere classified: Secondary | ICD-10-CM | POA: Diagnosis not present

## 2021-12-04 DIAGNOSIS — G63 Polyneuropathy in diseases classified elsewhere: Secondary | ICD-10-CM | POA: Diagnosis not present

## 2021-12-04 DIAGNOSIS — G4733 Obstructive sleep apnea (adult) (pediatric): Secondary | ICD-10-CM | POA: Diagnosis not present

## 2021-12-05 DIAGNOSIS — M6281 Muscle weakness (generalized): Secondary | ICD-10-CM | POA: Diagnosis not present

## 2021-12-05 DIAGNOSIS — G63 Polyneuropathy in diseases classified elsewhere: Secondary | ICD-10-CM | POA: Diagnosis not present

## 2021-12-05 DIAGNOSIS — R262 Difficulty in walking, not elsewhere classified: Secondary | ICD-10-CM | POA: Diagnosis not present

## 2021-12-05 DIAGNOSIS — I5032 Chronic diastolic (congestive) heart failure: Secondary | ICD-10-CM | POA: Diagnosis not present

## 2021-12-05 DIAGNOSIS — Z741 Need for assistance with personal care: Secondary | ICD-10-CM | POA: Diagnosis not present

## 2021-12-05 DIAGNOSIS — M4722 Other spondylosis with radiculopathy, cervical region: Secondary | ICD-10-CM | POA: Diagnosis not present

## 2021-12-06 DIAGNOSIS — R262 Difficulty in walking, not elsewhere classified: Secondary | ICD-10-CM | POA: Diagnosis not present

## 2021-12-06 DIAGNOSIS — I5032 Chronic diastolic (congestive) heart failure: Secondary | ICD-10-CM | POA: Diagnosis not present

## 2021-12-06 DIAGNOSIS — Z741 Need for assistance with personal care: Secondary | ICD-10-CM | POA: Diagnosis not present

## 2021-12-06 DIAGNOSIS — M6281 Muscle weakness (generalized): Secondary | ICD-10-CM | POA: Diagnosis not present

## 2021-12-06 DIAGNOSIS — R52 Pain, unspecified: Secondary | ICD-10-CM | POA: Diagnosis not present

## 2021-12-06 DIAGNOSIS — I10 Essential (primary) hypertension: Secondary | ICD-10-CM | POA: Diagnosis not present

## 2021-12-06 DIAGNOSIS — G63 Polyneuropathy in diseases classified elsewhere: Secondary | ICD-10-CM | POA: Diagnosis not present

## 2021-12-06 DIAGNOSIS — M4722 Other spondylosis with radiculopathy, cervical region: Secondary | ICD-10-CM | POA: Diagnosis not present

## 2021-12-07 DIAGNOSIS — M4722 Other spondylosis with radiculopathy, cervical region: Secondary | ICD-10-CM | POA: Diagnosis not present

## 2021-12-07 DIAGNOSIS — R262 Difficulty in walking, not elsewhere classified: Secondary | ICD-10-CM | POA: Diagnosis not present

## 2021-12-07 DIAGNOSIS — M6281 Muscle weakness (generalized): Secondary | ICD-10-CM | POA: Diagnosis not present

## 2021-12-07 DIAGNOSIS — G63 Polyneuropathy in diseases classified elsewhere: Secondary | ICD-10-CM | POA: Diagnosis not present

## 2021-12-07 DIAGNOSIS — Z741 Need for assistance with personal care: Secondary | ICD-10-CM | POA: Diagnosis not present

## 2021-12-07 DIAGNOSIS — I5032 Chronic diastolic (congestive) heart failure: Secondary | ICD-10-CM | POA: Diagnosis not present

## 2021-12-08 DIAGNOSIS — I5032 Chronic diastolic (congestive) heart failure: Secondary | ICD-10-CM | POA: Diagnosis not present

## 2021-12-08 DIAGNOSIS — R262 Difficulty in walking, not elsewhere classified: Secondary | ICD-10-CM | POA: Diagnosis not present

## 2021-12-08 DIAGNOSIS — M4722 Other spondylosis with radiculopathy, cervical region: Secondary | ICD-10-CM | POA: Diagnosis not present

## 2021-12-08 DIAGNOSIS — Z741 Need for assistance with personal care: Secondary | ICD-10-CM | POA: Diagnosis not present

## 2021-12-08 DIAGNOSIS — M6281 Muscle weakness (generalized): Secondary | ICD-10-CM | POA: Diagnosis not present

## 2021-12-08 DIAGNOSIS — G63 Polyneuropathy in diseases classified elsewhere: Secondary | ICD-10-CM | POA: Diagnosis not present

## 2021-12-09 DIAGNOSIS — G63 Polyneuropathy in diseases classified elsewhere: Secondary | ICD-10-CM | POA: Diagnosis not present

## 2021-12-09 DIAGNOSIS — R262 Difficulty in walking, not elsewhere classified: Secondary | ICD-10-CM | POA: Diagnosis not present

## 2021-12-09 DIAGNOSIS — M4722 Other spondylosis with radiculopathy, cervical region: Secondary | ICD-10-CM | POA: Diagnosis not present

## 2021-12-09 DIAGNOSIS — I5032 Chronic diastolic (congestive) heart failure: Secondary | ICD-10-CM | POA: Diagnosis not present

## 2021-12-09 DIAGNOSIS — Z741 Need for assistance with personal care: Secondary | ICD-10-CM | POA: Diagnosis not present

## 2021-12-09 DIAGNOSIS — M6281 Muscle weakness (generalized): Secondary | ICD-10-CM | POA: Diagnosis not present

## 2021-12-11 DIAGNOSIS — M4722 Other spondylosis with radiculopathy, cervical region: Secondary | ICD-10-CM | POA: Diagnosis not present

## 2021-12-11 DIAGNOSIS — Z741 Need for assistance with personal care: Secondary | ICD-10-CM | POA: Diagnosis not present

## 2021-12-11 DIAGNOSIS — G894 Chronic pain syndrome: Secondary | ICD-10-CM | POA: Diagnosis not present

## 2021-12-11 DIAGNOSIS — R262 Difficulty in walking, not elsewhere classified: Secondary | ICD-10-CM | POA: Diagnosis not present

## 2021-12-11 DIAGNOSIS — G63 Polyneuropathy in diseases classified elsewhere: Secondary | ICD-10-CM | POA: Diagnosis not present

## 2021-12-11 DIAGNOSIS — I5032 Chronic diastolic (congestive) heart failure: Secondary | ICD-10-CM | POA: Diagnosis not present

## 2021-12-11 DIAGNOSIS — M6281 Muscle weakness (generalized): Secondary | ICD-10-CM | POA: Diagnosis not present

## 2021-12-12 DIAGNOSIS — R262 Difficulty in walking, not elsewhere classified: Secondary | ICD-10-CM | POA: Diagnosis not present

## 2021-12-12 DIAGNOSIS — I5032 Chronic diastolic (congestive) heart failure: Secondary | ICD-10-CM | POA: Diagnosis not present

## 2021-12-12 DIAGNOSIS — M6281 Muscle weakness (generalized): Secondary | ICD-10-CM | POA: Diagnosis not present

## 2021-12-12 DIAGNOSIS — M4722 Other spondylosis with radiculopathy, cervical region: Secondary | ICD-10-CM | POA: Diagnosis not present

## 2021-12-12 DIAGNOSIS — Z741 Need for assistance with personal care: Secondary | ICD-10-CM | POA: Diagnosis not present

## 2021-12-12 DIAGNOSIS — G63 Polyneuropathy in diseases classified elsewhere: Secondary | ICD-10-CM | POA: Diagnosis not present

## 2021-12-13 DIAGNOSIS — I5032 Chronic diastolic (congestive) heart failure: Secondary | ICD-10-CM | POA: Diagnosis not present

## 2021-12-13 DIAGNOSIS — G63 Polyneuropathy in diseases classified elsewhere: Secondary | ICD-10-CM | POA: Diagnosis not present

## 2021-12-13 DIAGNOSIS — Z741 Need for assistance with personal care: Secondary | ICD-10-CM | POA: Diagnosis not present

## 2021-12-13 DIAGNOSIS — M4722 Other spondylosis with radiculopathy, cervical region: Secondary | ICD-10-CM | POA: Diagnosis not present

## 2021-12-13 DIAGNOSIS — M6281 Muscle weakness (generalized): Secondary | ICD-10-CM | POA: Diagnosis not present

## 2021-12-13 DIAGNOSIS — R262 Difficulty in walking, not elsewhere classified: Secondary | ICD-10-CM | POA: Diagnosis not present

## 2021-12-14 DIAGNOSIS — M6281 Muscle weakness (generalized): Secondary | ICD-10-CM | POA: Diagnosis not present

## 2021-12-14 DIAGNOSIS — M4722 Other spondylosis with radiculopathy, cervical region: Secondary | ICD-10-CM | POA: Diagnosis not present

## 2021-12-14 DIAGNOSIS — Z741 Need for assistance with personal care: Secondary | ICD-10-CM | POA: Diagnosis not present

## 2021-12-14 DIAGNOSIS — G63 Polyneuropathy in diseases classified elsewhere: Secondary | ICD-10-CM | POA: Diagnosis not present

## 2021-12-14 DIAGNOSIS — I5032 Chronic diastolic (congestive) heart failure: Secondary | ICD-10-CM | POA: Diagnosis not present

## 2021-12-14 DIAGNOSIS — R262 Difficulty in walking, not elsewhere classified: Secondary | ICD-10-CM | POA: Diagnosis not present

## 2021-12-15 DIAGNOSIS — R262 Difficulty in walking, not elsewhere classified: Secondary | ICD-10-CM | POA: Diagnosis not present

## 2021-12-15 DIAGNOSIS — M4722 Other spondylosis with radiculopathy, cervical region: Secondary | ICD-10-CM | POA: Diagnosis not present

## 2021-12-15 DIAGNOSIS — M6281 Muscle weakness (generalized): Secondary | ICD-10-CM | POA: Diagnosis not present

## 2021-12-15 DIAGNOSIS — Z741 Need for assistance with personal care: Secondary | ICD-10-CM | POA: Diagnosis not present

## 2021-12-15 DIAGNOSIS — G63 Polyneuropathy in diseases classified elsewhere: Secondary | ICD-10-CM | POA: Diagnosis not present

## 2021-12-15 DIAGNOSIS — I5032 Chronic diastolic (congestive) heart failure: Secondary | ICD-10-CM | POA: Diagnosis not present

## 2021-12-18 DIAGNOSIS — Z741 Need for assistance with personal care: Secondary | ICD-10-CM | POA: Diagnosis not present

## 2021-12-18 DIAGNOSIS — M4722 Other spondylosis with radiculopathy, cervical region: Secondary | ICD-10-CM | POA: Diagnosis not present

## 2021-12-18 DIAGNOSIS — R262 Difficulty in walking, not elsewhere classified: Secondary | ICD-10-CM | POA: Diagnosis not present

## 2021-12-18 DIAGNOSIS — G63 Polyneuropathy in diseases classified elsewhere: Secondary | ICD-10-CM | POA: Diagnosis not present

## 2021-12-18 DIAGNOSIS — M6281 Muscle weakness (generalized): Secondary | ICD-10-CM | POA: Diagnosis not present

## 2021-12-18 DIAGNOSIS — I5032 Chronic diastolic (congestive) heart failure: Secondary | ICD-10-CM | POA: Diagnosis not present

## 2021-12-19 DIAGNOSIS — M4722 Other spondylosis with radiculopathy, cervical region: Secondary | ICD-10-CM | POA: Diagnosis not present

## 2021-12-19 DIAGNOSIS — Z741 Need for assistance with personal care: Secondary | ICD-10-CM | POA: Diagnosis not present

## 2021-12-19 DIAGNOSIS — M6281 Muscle weakness (generalized): Secondary | ICD-10-CM | POA: Diagnosis not present

## 2021-12-19 DIAGNOSIS — R262 Difficulty in walking, not elsewhere classified: Secondary | ICD-10-CM | POA: Diagnosis not present

## 2021-12-19 DIAGNOSIS — G63 Polyneuropathy in diseases classified elsewhere: Secondary | ICD-10-CM | POA: Diagnosis not present

## 2021-12-19 DIAGNOSIS — I5032 Chronic diastolic (congestive) heart failure: Secondary | ICD-10-CM | POA: Diagnosis not present

## 2021-12-20 DIAGNOSIS — R262 Difficulty in walking, not elsewhere classified: Secondary | ICD-10-CM | POA: Diagnosis not present

## 2021-12-20 DIAGNOSIS — M6281 Muscle weakness (generalized): Secondary | ICD-10-CM | POA: Diagnosis not present

## 2021-12-20 DIAGNOSIS — G63 Polyneuropathy in diseases classified elsewhere: Secondary | ICD-10-CM | POA: Diagnosis not present

## 2021-12-20 DIAGNOSIS — M4722 Other spondylosis with radiculopathy, cervical region: Secondary | ICD-10-CM | POA: Diagnosis not present

## 2021-12-20 DIAGNOSIS — Z741 Need for assistance with personal care: Secondary | ICD-10-CM | POA: Diagnosis not present

## 2021-12-20 DIAGNOSIS — I5032 Chronic diastolic (congestive) heart failure: Secondary | ICD-10-CM | POA: Diagnosis not present

## 2021-12-21 DIAGNOSIS — G63 Polyneuropathy in diseases classified elsewhere: Secondary | ICD-10-CM | POA: Diagnosis not present

## 2021-12-21 DIAGNOSIS — M4722 Other spondylosis with radiculopathy, cervical region: Secondary | ICD-10-CM | POA: Diagnosis not present

## 2021-12-21 DIAGNOSIS — Z741 Need for assistance with personal care: Secondary | ICD-10-CM | POA: Diagnosis not present

## 2021-12-21 DIAGNOSIS — I5032 Chronic diastolic (congestive) heart failure: Secondary | ICD-10-CM | POA: Diagnosis not present

## 2021-12-21 DIAGNOSIS — R262 Difficulty in walking, not elsewhere classified: Secondary | ICD-10-CM | POA: Diagnosis not present

## 2021-12-21 DIAGNOSIS — M6281 Muscle weakness (generalized): Secondary | ICD-10-CM | POA: Diagnosis not present

## 2021-12-22 DIAGNOSIS — F410 Panic disorder [episodic paroxysmal anxiety] without agoraphobia: Secondary | ICD-10-CM | POA: Diagnosis not present

## 2021-12-22 DIAGNOSIS — R2681 Unsteadiness on feet: Secondary | ICD-10-CM | POA: Diagnosis not present

## 2021-12-22 DIAGNOSIS — Z741 Need for assistance with personal care: Secondary | ICD-10-CM | POA: Diagnosis not present

## 2021-12-22 DIAGNOSIS — G63 Polyneuropathy in diseases classified elsewhere: Secondary | ICD-10-CM | POA: Diagnosis not present

## 2021-12-22 DIAGNOSIS — M6281 Muscle weakness (generalized): Secondary | ICD-10-CM | POA: Diagnosis not present

## 2021-12-22 DIAGNOSIS — I5032 Chronic diastolic (congestive) heart failure: Secondary | ICD-10-CM | POA: Diagnosis not present

## 2021-12-22 DIAGNOSIS — R262 Difficulty in walking, not elsewhere classified: Secondary | ICD-10-CM | POA: Diagnosis not present

## 2021-12-22 DIAGNOSIS — N182 Chronic kidney disease, stage 2 (mild): Secondary | ICD-10-CM | POA: Diagnosis not present

## 2021-12-22 DIAGNOSIS — M4722 Other spondylosis with radiculopathy, cervical region: Secondary | ICD-10-CM | POA: Diagnosis not present

## 2021-12-23 DIAGNOSIS — I5032 Chronic diastolic (congestive) heart failure: Secondary | ICD-10-CM | POA: Diagnosis not present

## 2021-12-23 DIAGNOSIS — R262 Difficulty in walking, not elsewhere classified: Secondary | ICD-10-CM | POA: Diagnosis not present

## 2021-12-23 DIAGNOSIS — M4722 Other spondylosis with radiculopathy, cervical region: Secondary | ICD-10-CM | POA: Diagnosis not present

## 2021-12-23 DIAGNOSIS — G63 Polyneuropathy in diseases classified elsewhere: Secondary | ICD-10-CM | POA: Diagnosis not present

## 2021-12-23 DIAGNOSIS — Z741 Need for assistance with personal care: Secondary | ICD-10-CM | POA: Diagnosis not present

## 2021-12-23 DIAGNOSIS — M6281 Muscle weakness (generalized): Secondary | ICD-10-CM | POA: Diagnosis not present

## 2021-12-25 DIAGNOSIS — M6281 Muscle weakness (generalized): Secondary | ICD-10-CM | POA: Diagnosis not present

## 2021-12-25 DIAGNOSIS — Z741 Need for assistance with personal care: Secondary | ICD-10-CM | POA: Diagnosis not present

## 2021-12-25 DIAGNOSIS — I5032 Chronic diastolic (congestive) heart failure: Secondary | ICD-10-CM | POA: Diagnosis not present

## 2021-12-25 DIAGNOSIS — M4722 Other spondylosis with radiculopathy, cervical region: Secondary | ICD-10-CM | POA: Diagnosis not present

## 2021-12-25 DIAGNOSIS — R262 Difficulty in walking, not elsewhere classified: Secondary | ICD-10-CM | POA: Diagnosis not present

## 2021-12-25 DIAGNOSIS — G63 Polyneuropathy in diseases classified elsewhere: Secondary | ICD-10-CM | POA: Diagnosis not present

## 2021-12-26 DIAGNOSIS — R262 Difficulty in walking, not elsewhere classified: Secondary | ICD-10-CM | POA: Diagnosis not present

## 2021-12-26 DIAGNOSIS — Z741 Need for assistance with personal care: Secondary | ICD-10-CM | POA: Diagnosis not present

## 2021-12-26 DIAGNOSIS — M6281 Muscle weakness (generalized): Secondary | ICD-10-CM | POA: Diagnosis not present

## 2021-12-26 DIAGNOSIS — G63 Polyneuropathy in diseases classified elsewhere: Secondary | ICD-10-CM | POA: Diagnosis not present

## 2021-12-26 DIAGNOSIS — I5032 Chronic diastolic (congestive) heart failure: Secondary | ICD-10-CM | POA: Diagnosis not present

## 2021-12-26 DIAGNOSIS — M4722 Other spondylosis with radiculopathy, cervical region: Secondary | ICD-10-CM | POA: Diagnosis not present

## 2021-12-27 DIAGNOSIS — M4722 Other spondylosis with radiculopathy, cervical region: Secondary | ICD-10-CM | POA: Diagnosis not present

## 2021-12-27 DIAGNOSIS — M6281 Muscle weakness (generalized): Secondary | ICD-10-CM | POA: Diagnosis not present

## 2021-12-27 DIAGNOSIS — Z741 Need for assistance with personal care: Secondary | ICD-10-CM | POA: Diagnosis not present

## 2021-12-27 DIAGNOSIS — R262 Difficulty in walking, not elsewhere classified: Secondary | ICD-10-CM | POA: Diagnosis not present

## 2021-12-27 DIAGNOSIS — I5032 Chronic diastolic (congestive) heart failure: Secondary | ICD-10-CM | POA: Diagnosis not present

## 2021-12-27 DIAGNOSIS — G63 Polyneuropathy in diseases classified elsewhere: Secondary | ICD-10-CM | POA: Diagnosis not present

## 2021-12-28 DIAGNOSIS — M6281 Muscle weakness (generalized): Secondary | ICD-10-CM | POA: Diagnosis not present

## 2021-12-28 DIAGNOSIS — G63 Polyneuropathy in diseases classified elsewhere: Secondary | ICD-10-CM | POA: Diagnosis not present

## 2021-12-28 DIAGNOSIS — I5032 Chronic diastolic (congestive) heart failure: Secondary | ICD-10-CM | POA: Diagnosis not present

## 2021-12-28 DIAGNOSIS — Z741 Need for assistance with personal care: Secondary | ICD-10-CM | POA: Diagnosis not present

## 2021-12-28 DIAGNOSIS — M4722 Other spondylosis with radiculopathy, cervical region: Secondary | ICD-10-CM | POA: Diagnosis not present

## 2021-12-28 DIAGNOSIS — R262 Difficulty in walking, not elsewhere classified: Secondary | ICD-10-CM | POA: Diagnosis not present

## 2021-12-29 DIAGNOSIS — M4722 Other spondylosis with radiculopathy, cervical region: Secondary | ICD-10-CM | POA: Diagnosis not present

## 2021-12-29 DIAGNOSIS — G63 Polyneuropathy in diseases classified elsewhere: Secondary | ICD-10-CM | POA: Diagnosis not present

## 2021-12-29 DIAGNOSIS — M6281 Muscle weakness (generalized): Secondary | ICD-10-CM | POA: Diagnosis not present

## 2021-12-29 DIAGNOSIS — I5032 Chronic diastolic (congestive) heart failure: Secondary | ICD-10-CM | POA: Diagnosis not present

## 2021-12-29 DIAGNOSIS — R262 Difficulty in walking, not elsewhere classified: Secondary | ICD-10-CM | POA: Diagnosis not present

## 2021-12-29 DIAGNOSIS — Z741 Need for assistance with personal care: Secondary | ICD-10-CM | POA: Diagnosis not present

## 2022-01-01 DIAGNOSIS — Z741 Need for assistance with personal care: Secondary | ICD-10-CM | POA: Diagnosis not present

## 2022-01-01 DIAGNOSIS — M4722 Other spondylosis with radiculopathy, cervical region: Secondary | ICD-10-CM | POA: Diagnosis not present

## 2022-01-01 DIAGNOSIS — R262 Difficulty in walking, not elsewhere classified: Secondary | ICD-10-CM | POA: Diagnosis not present

## 2022-01-01 DIAGNOSIS — M6281 Muscle weakness (generalized): Secondary | ICD-10-CM | POA: Diagnosis not present

## 2022-01-01 DIAGNOSIS — G63 Polyneuropathy in diseases classified elsewhere: Secondary | ICD-10-CM | POA: Diagnosis not present

## 2022-01-01 DIAGNOSIS — I5032 Chronic diastolic (congestive) heart failure: Secondary | ICD-10-CM | POA: Diagnosis not present

## 2022-01-04 IMAGING — CT CT L SPINE W/O CM
3 of 5 series · 16 of 33 positions shown, 19 images · non-contrast
Comparison: Thoracic spine CT today reported separately. CT Abdomen
and Pelvis 10/06/2017.

CLINICAL DATA: 78-year-old male status post fall. Headache and back
pain.

EXAM:
CT LUMBAR SPINE WITHOUT CONTRAST
TECHNIQUE: Multidetector CT imaging of the lumbar spine was performed without
intravenous contrast administration. Multiplanar CT image
reconstructions were also generated.

[Series 5: l spine soft · axial · 0.41mm/px · z∈[+568,+860]mm · 10 of 174 slices shown, 13 images]
[im 14/174  soft-tissue]
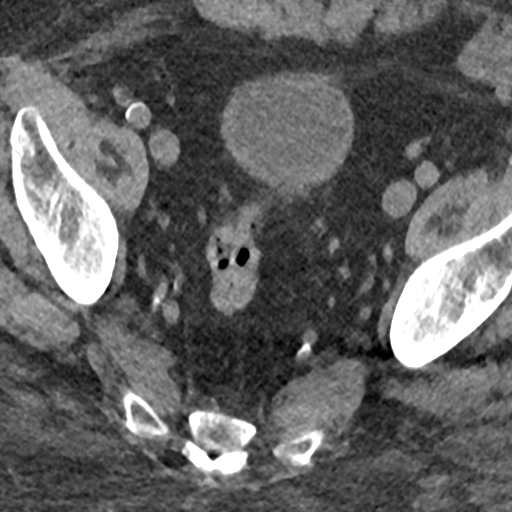
[im 14/174  bone]
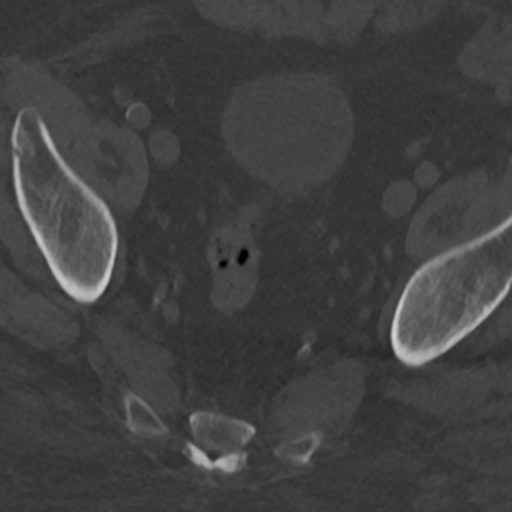
[im 27/174  bone]
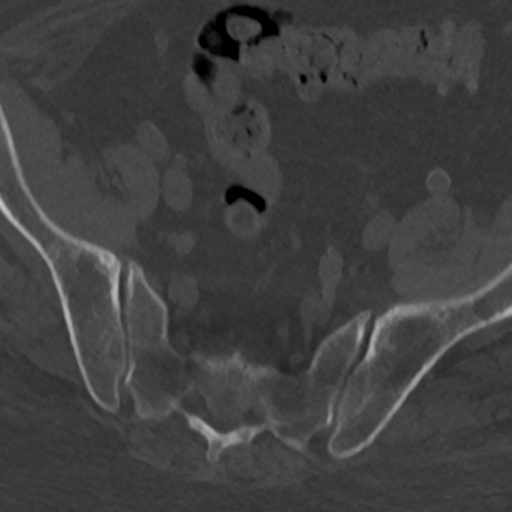
[im 54/174  bone]
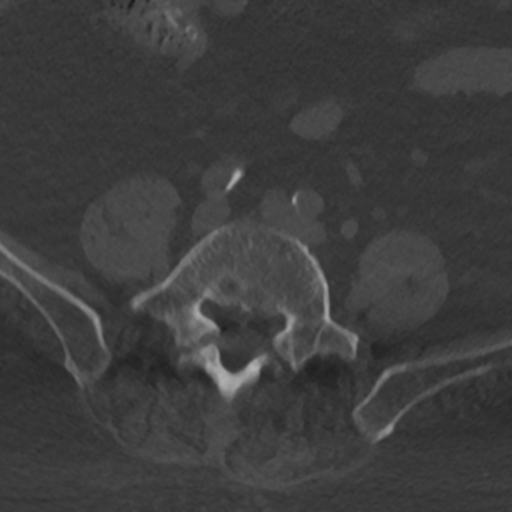
[im 67/174  bone]
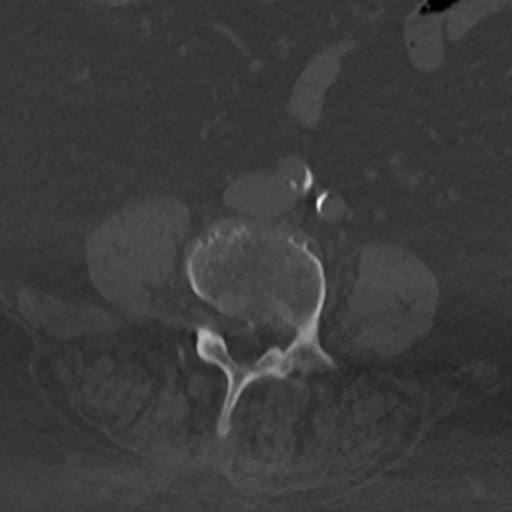
[im 80/174  soft-tissue]
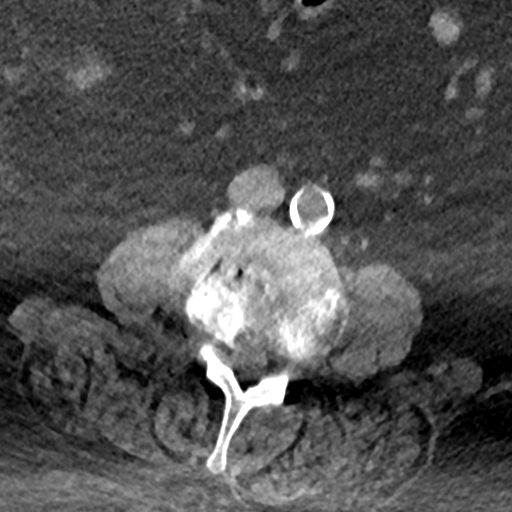
[im 80/174  bone]
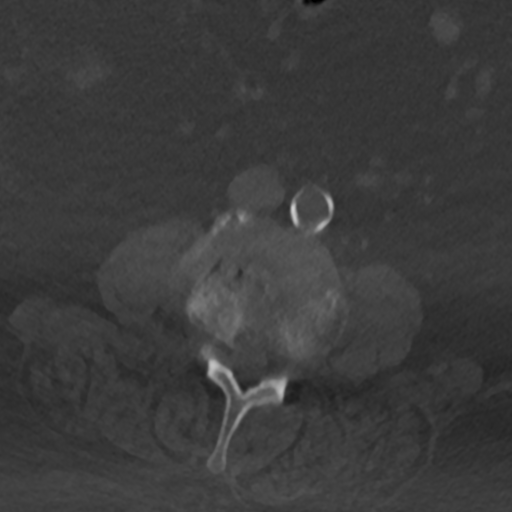
[im 94/174  bone]
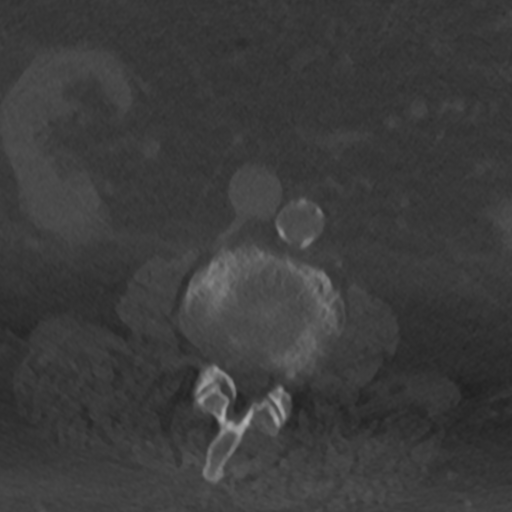
[im 107/174  bone]
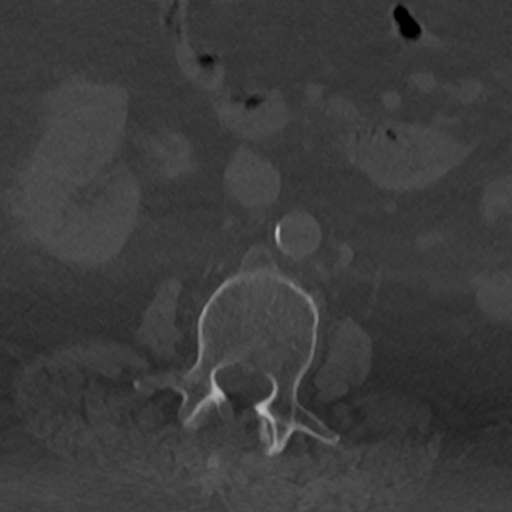
[im 134/174  bone]
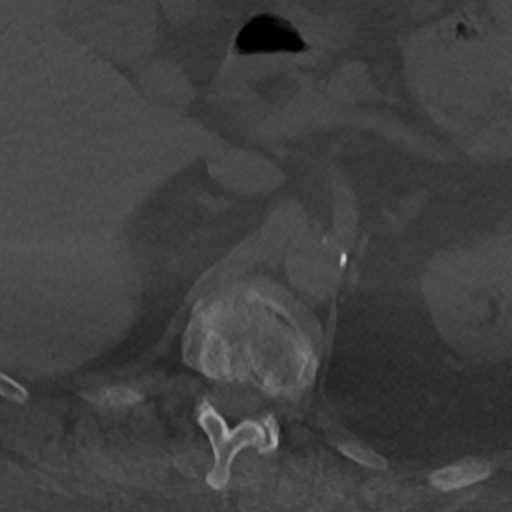
[im 147/174  soft-tissue]
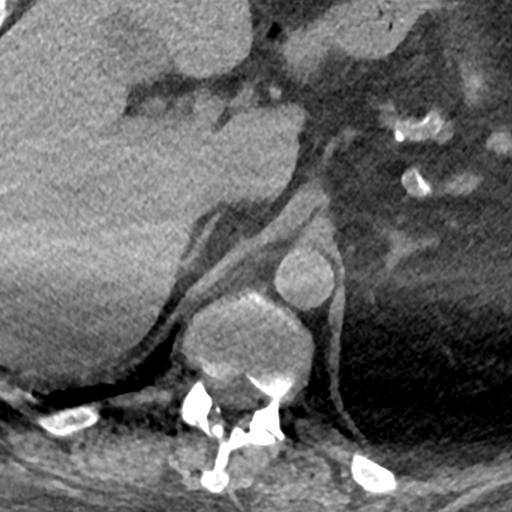
[im 147/174  bone]
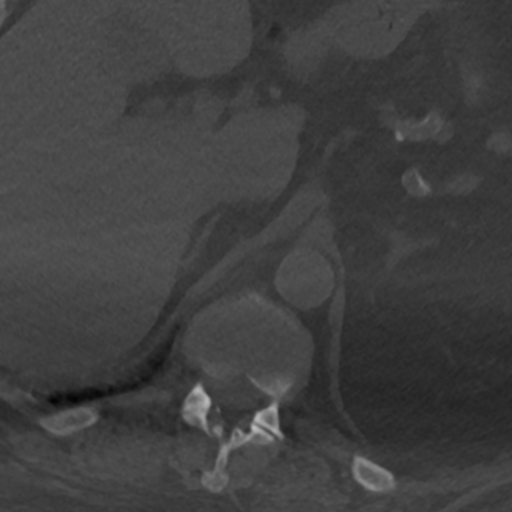
[im 160/174  bone]
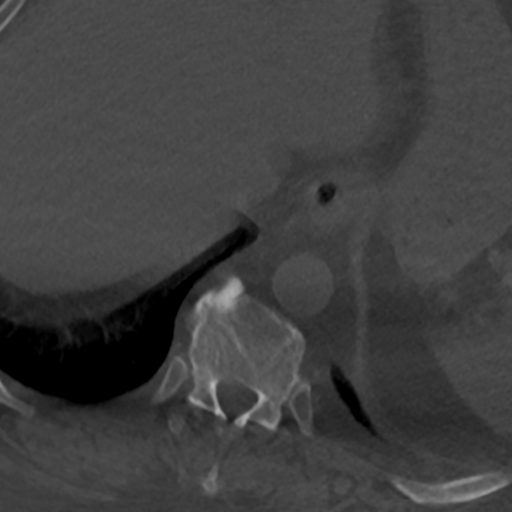

[Series 6: sag bone · sagittal · 0.70mm/px · 5 of 98 slices shown]
[im 17/98  bone]
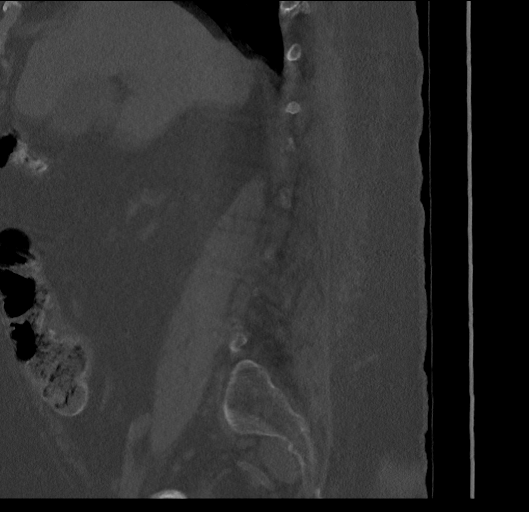
[im 33/98  bone]
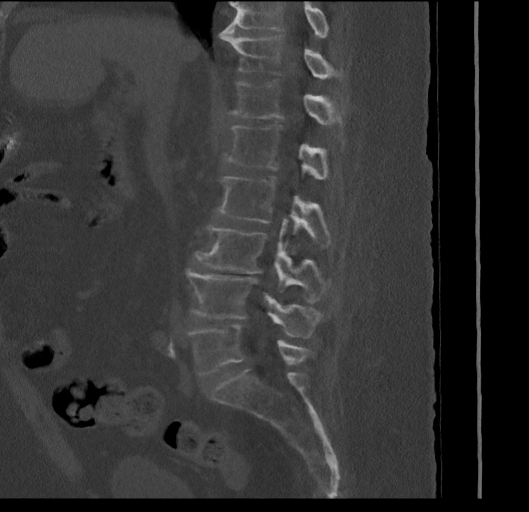
[im 49/98  bone]
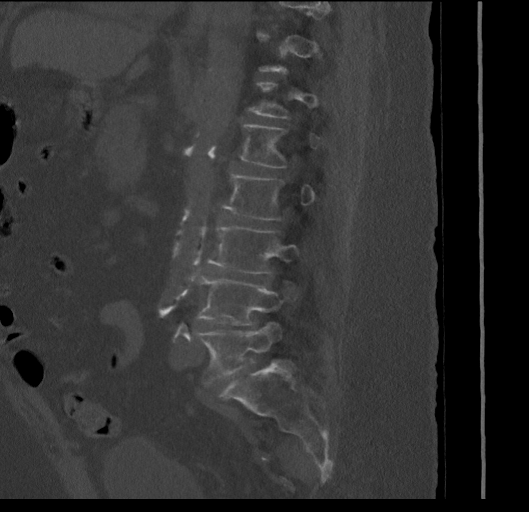
[im 65/98  bone]
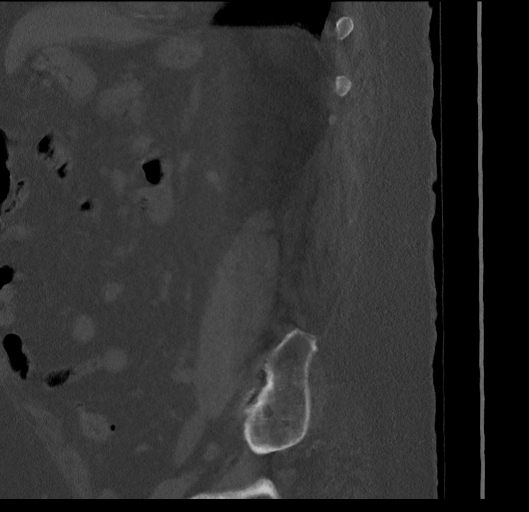
[im 81/98  bone]
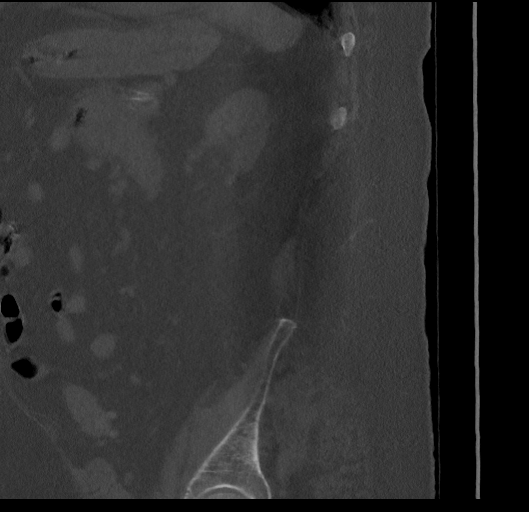

[Series 7: cor bone · coronal · 0.47mm/px · 1 of 185 slices shown]
[im 93/185  bone]
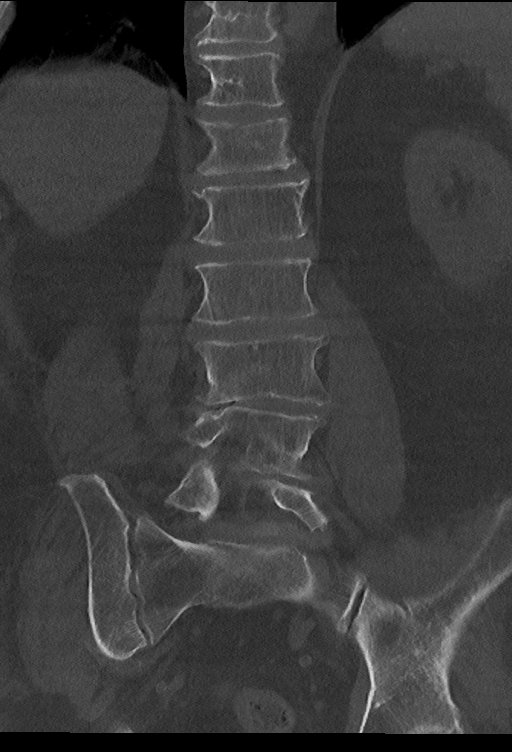

[16 of 33 positions shown; findings below may reference images not displayed]

FINDINGS: Segmentation: Normal, concordant with the thoracic spine numbering
today.

Alignment: Straightening of lumbar lordosis is stable since 3491.
There is subtle chronic anterolisthesis of L5 on S1.

Vertebrae: Mild T12 superior endplate compression fracture again
noted. There are chronic bilateral L5 pars fractures, unchanged
since 3491. The L1 through L4 levels appears stable and intact.
Visible sacrum and SI joints appear intact.

Paraspinal and other soft tissues: Calcified aortic atherosclerosis.
Stable visible noncontrast abdominal and pelvic viscera.
Diverticulosis of the sigmoid colon. Negative lumbar paraspinal soft
tissues.

Disc levels: Multilevel advanced lumbar disc degeneration with
vacuum disc. Multifactorial degenerative lumbar spinal stenosis
appears maximal at L3-L4 and stable since 3491.
IMPRESSION: 1. No acute traumatic injury identified in the lumbar spine. Chronic
bilateral L5 pars fractures.
2. Chronically advanced lumbar spine degeneration stable since the

## 2022-01-04 IMAGING — DX DG TIBIA/FIBULA 2V*R*
3 series · 3 of 3 positions shown · non-contrast
Comparison: None.

CLINICAL DATA: Right lower extremity pain.

EXAM:
RIGHT TIBIA AND FIBULA - 2 VIEW

[tibia ap]
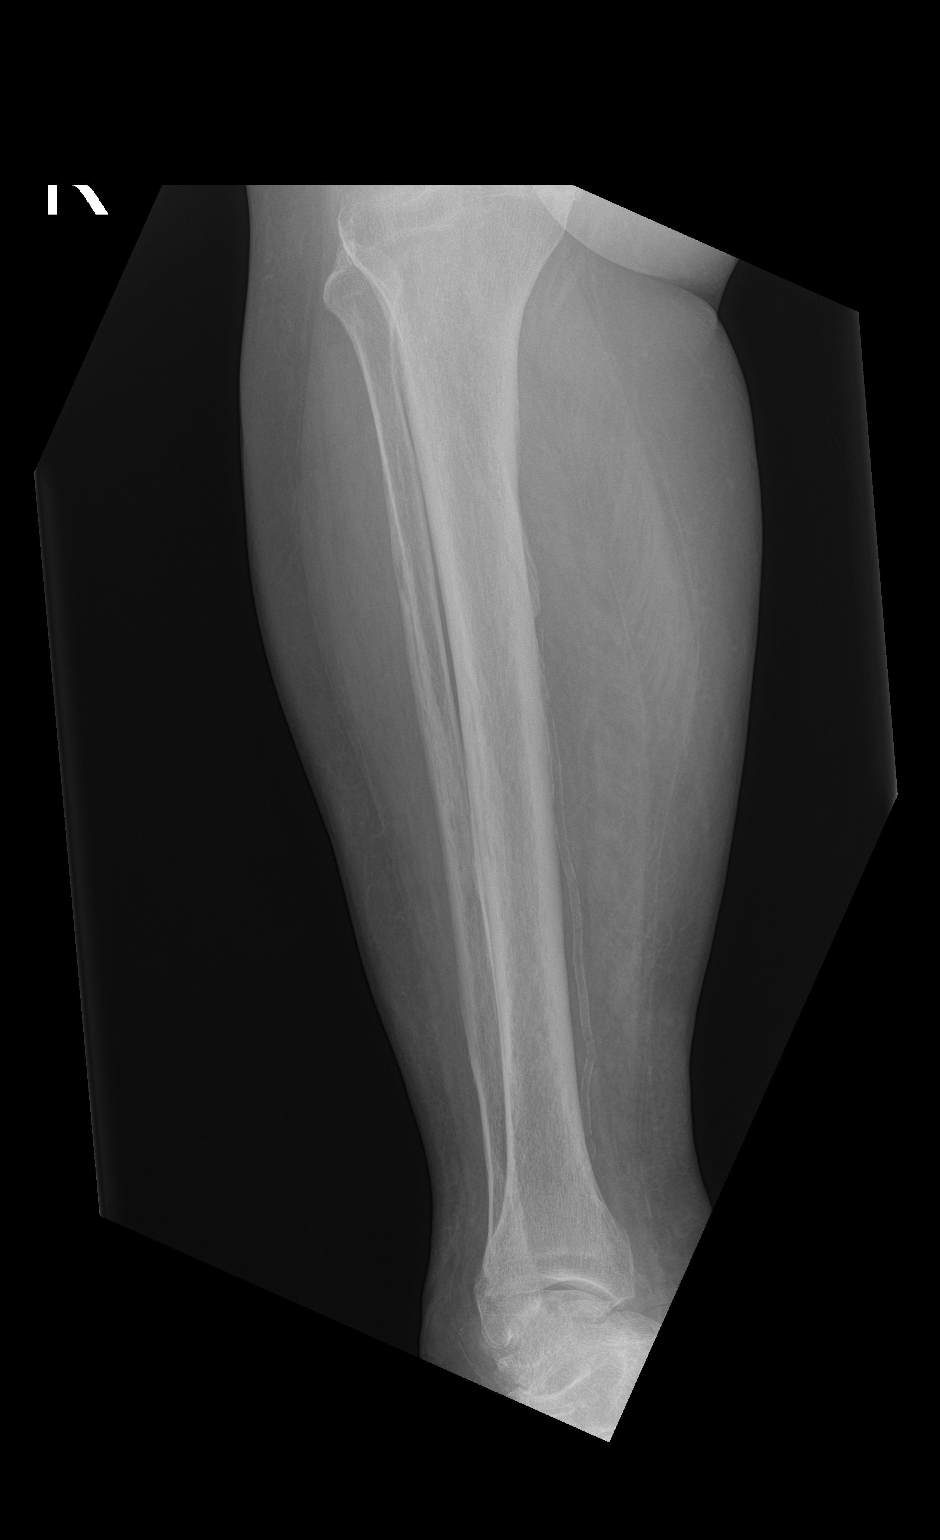

[tibia lat (1 of 2)]
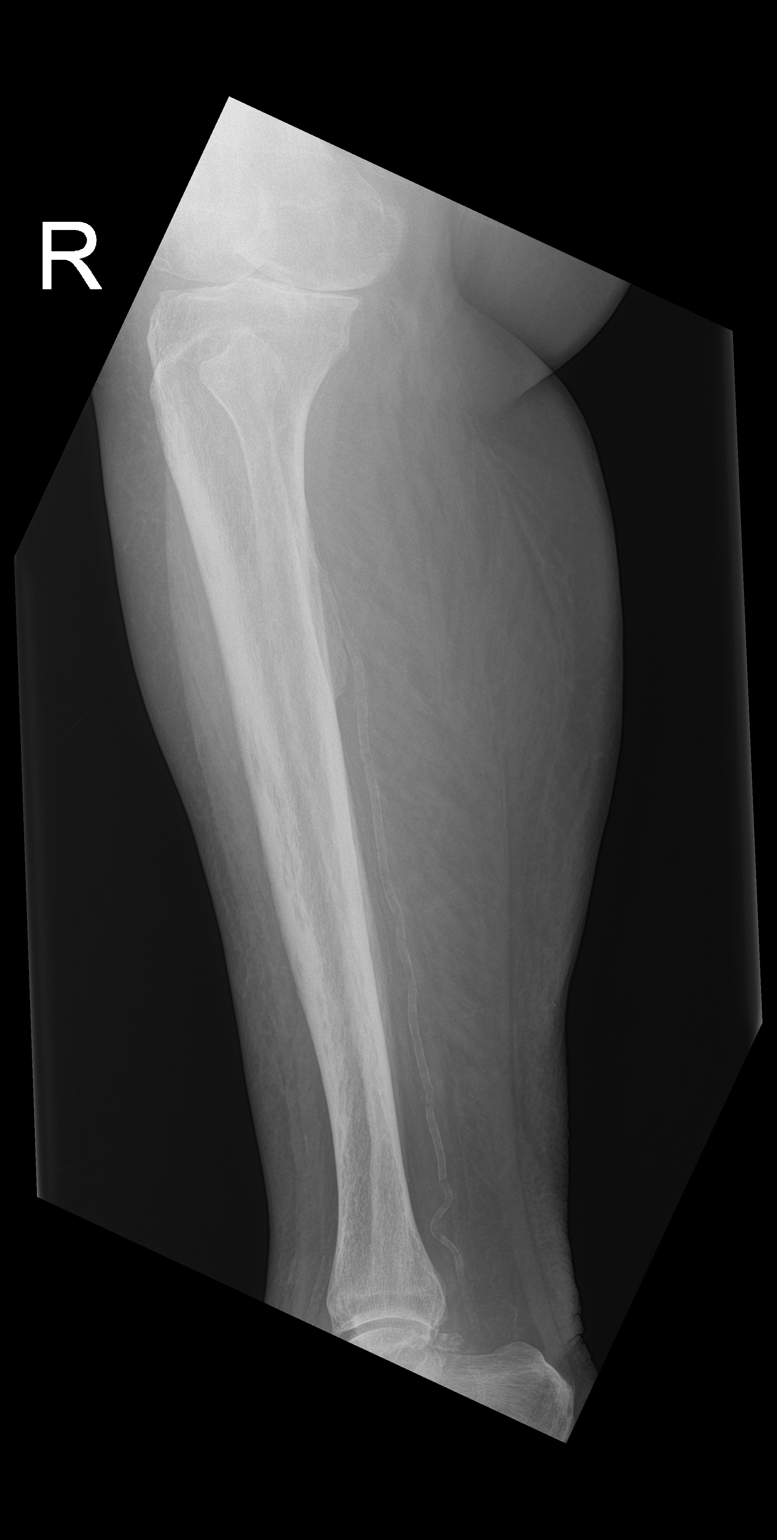

[tibia lat (2 of 2)]
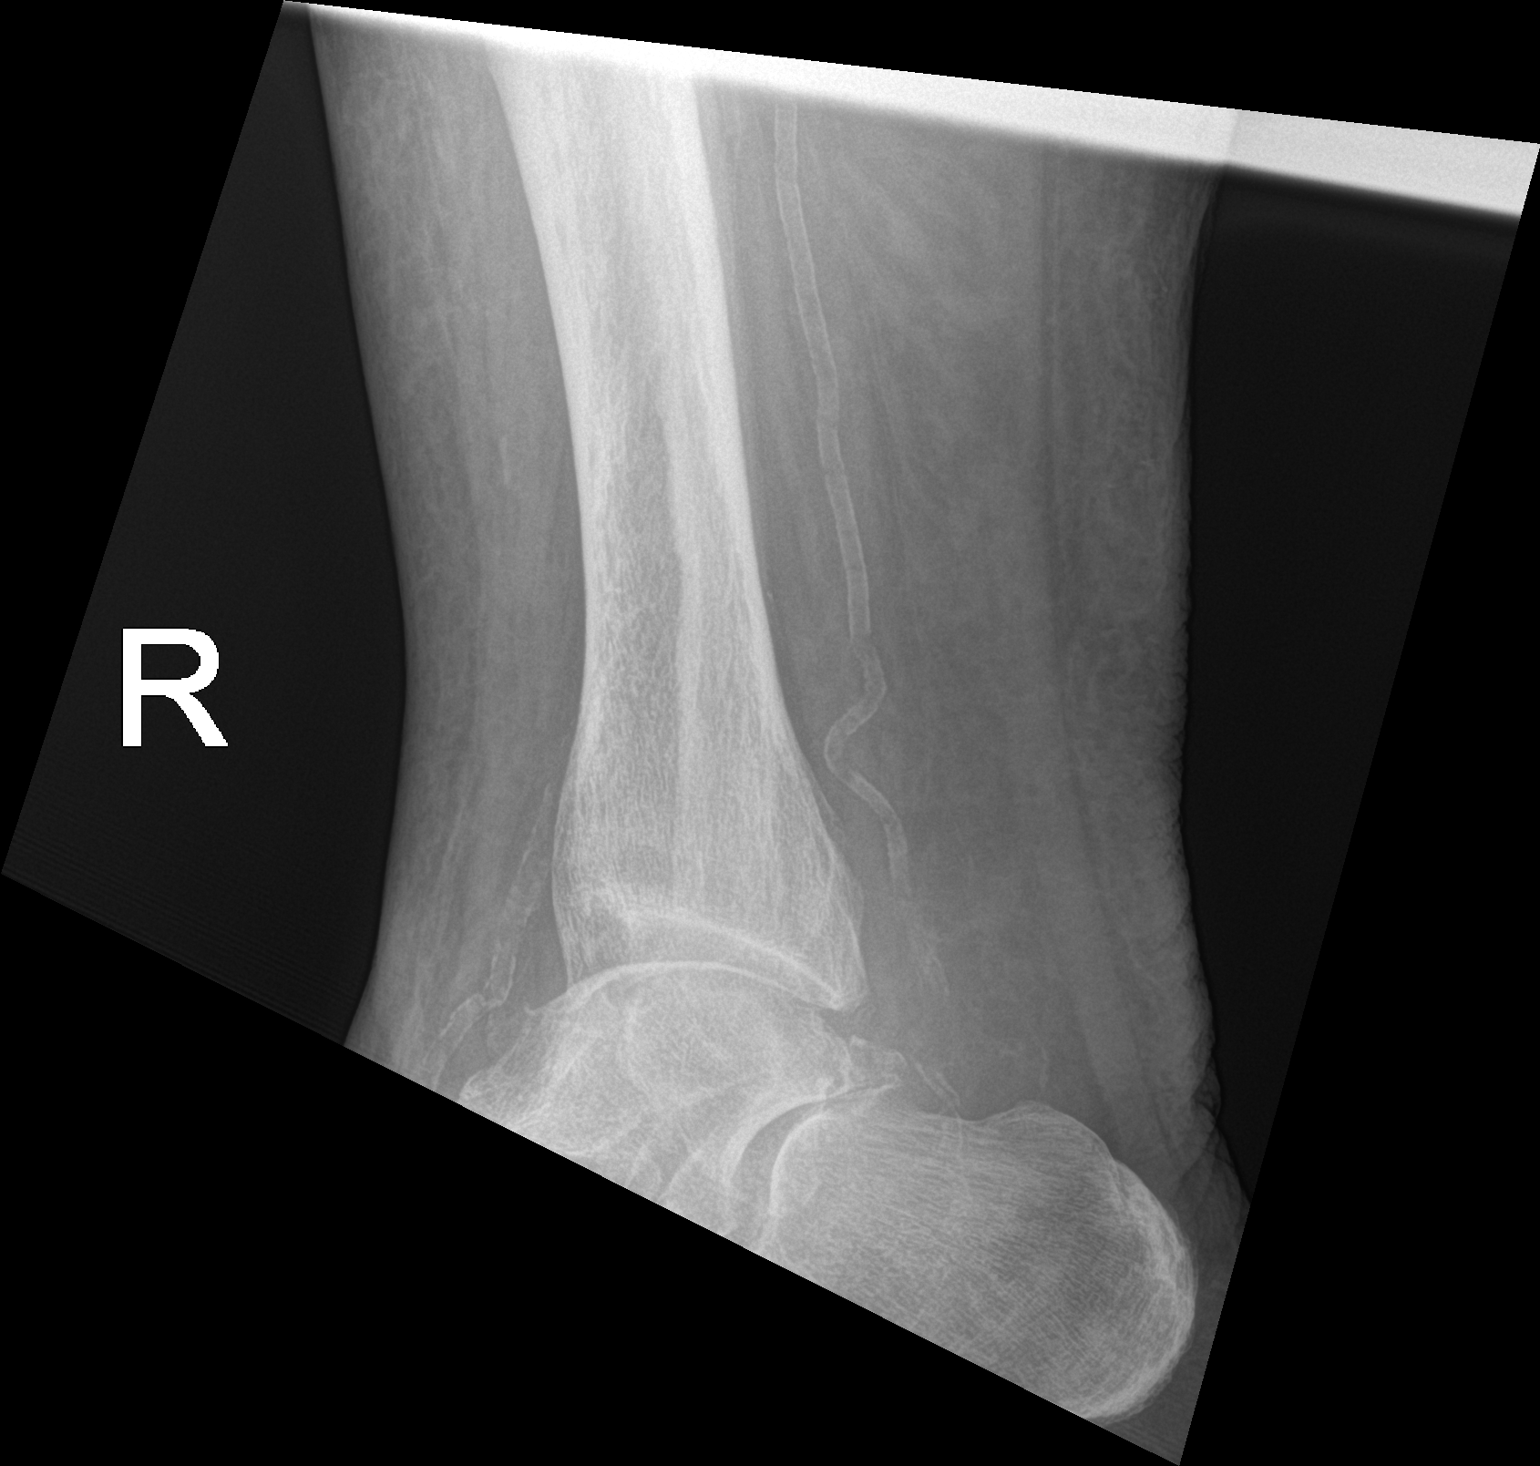

[3 of 3 positions shown; findings below may reference images not displayed]

FINDINGS: There is no evidence of fracture or other focal bone lesions.
Vascular calcifications are noted.
IMPRESSION: Negative.

## 2022-01-04 IMAGING — CT CT CERVICAL SPINE W/O CM
3 of 4 series · 13 of 33 positions shown, 16 images · non-contrast
Comparison: Head CT today reported separately.

CLINICAL DATA: 78-year-old male status post fall. Headache and back
pain.

EXAM:
CT CERVICAL SPINE WITHOUT CONTRAST
TECHNIQUE: Multidetector CT imaging of the cervical spine was performed without
intravenous contrast. Multiplanar CT image reconstructions were also
generated.

[Series 8: sag bone · sagittal · 0.42mm/px · 5 of 118 slices shown, 6 images]
[im 40/118  bone]
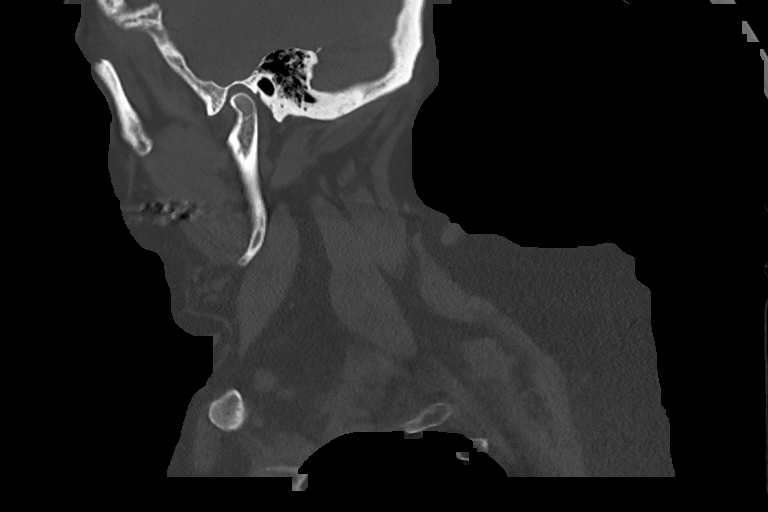
[im 49/118  bone]
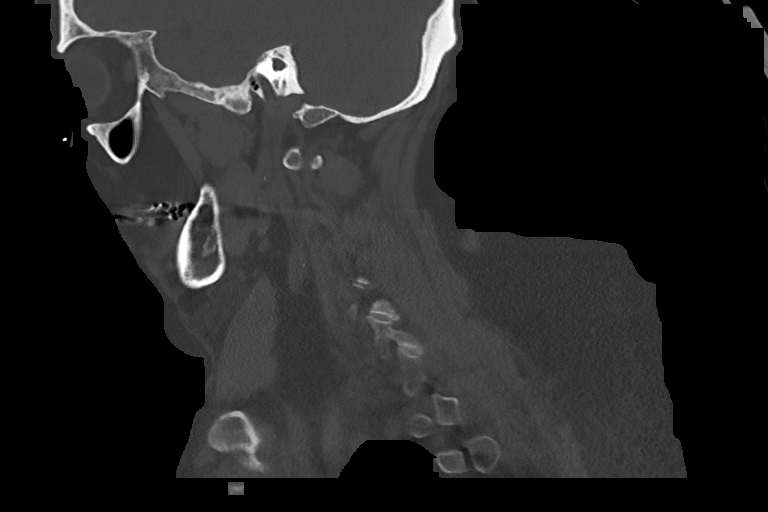
[im 59/118  soft-tissue]
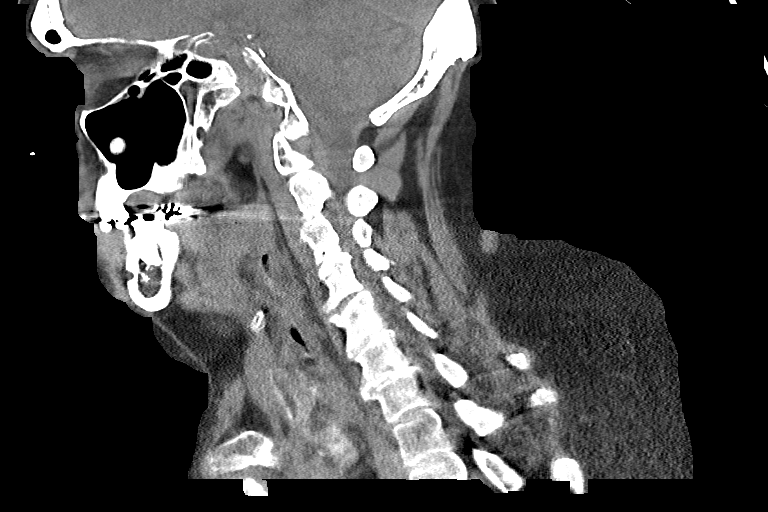
[im 59/118  bone]
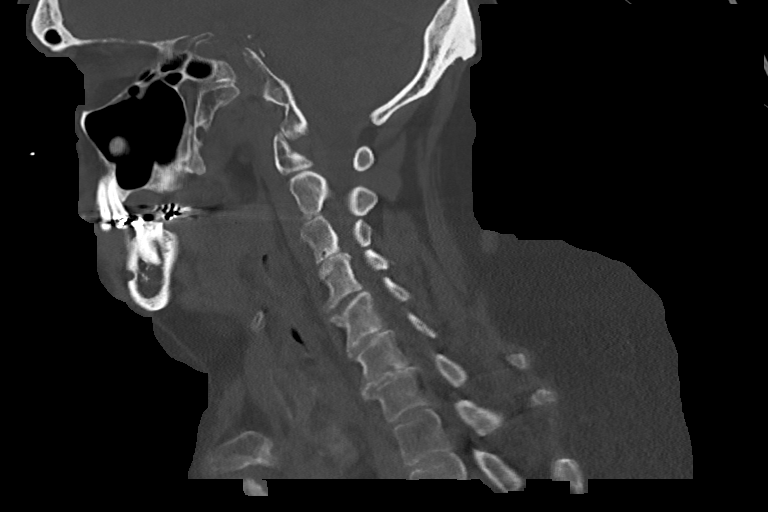
[im 69/118  bone]
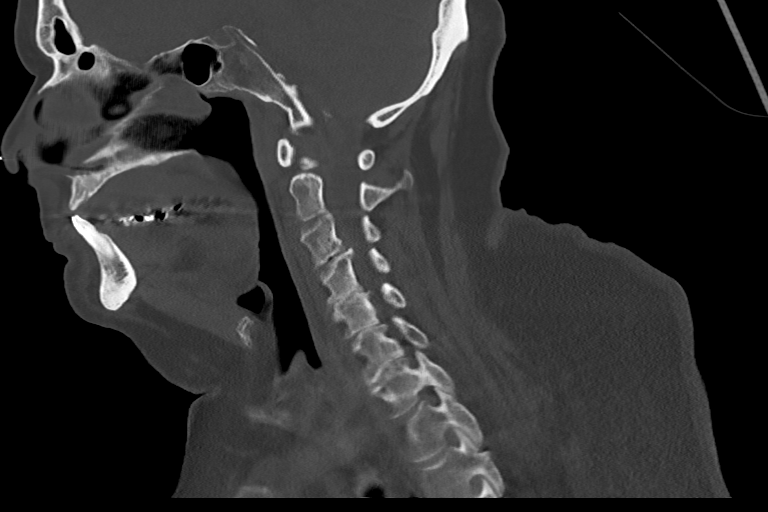
[im 79/118  bone]
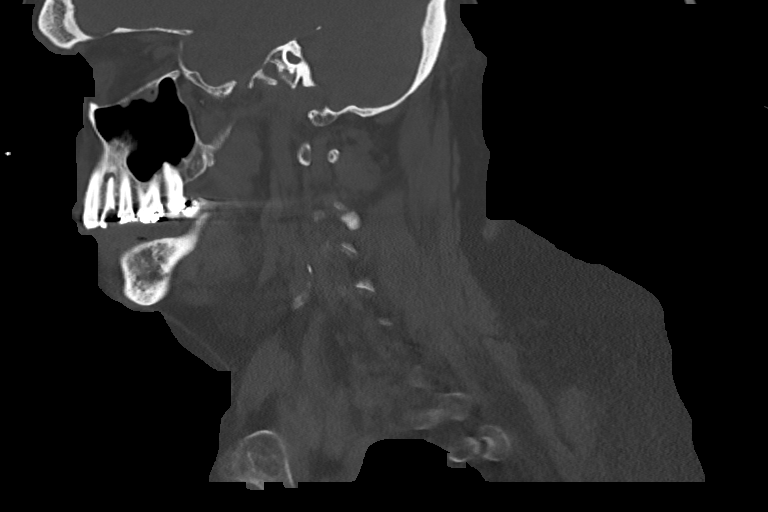

[Series 9: cor bone · coronal · 0.42mm/px · 3 of 161 slices shown]
[im 33/161  bone]
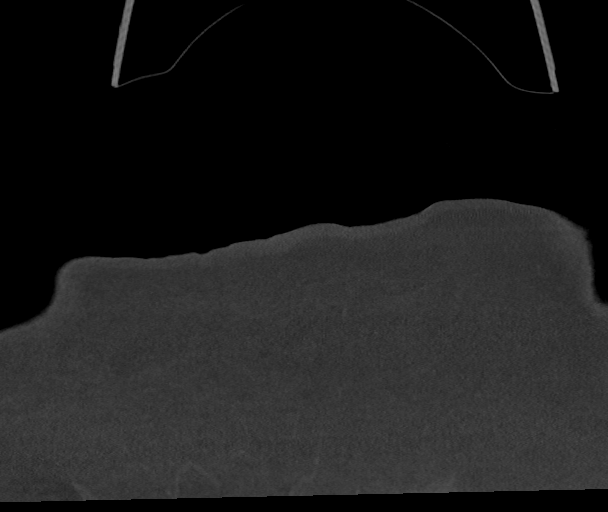
[im 65/161  bone]
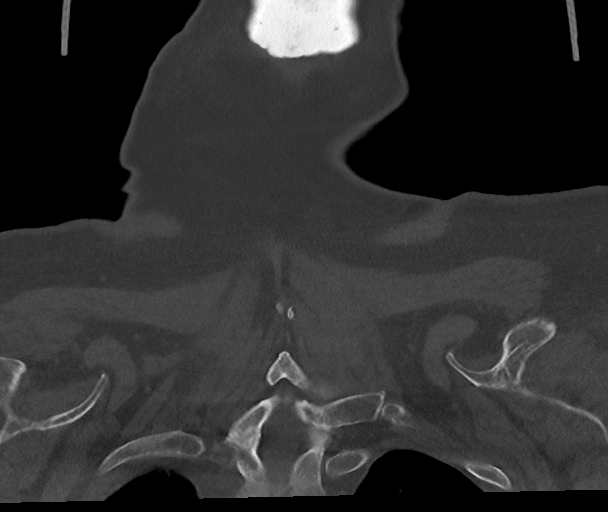
[im 97/161  bone]
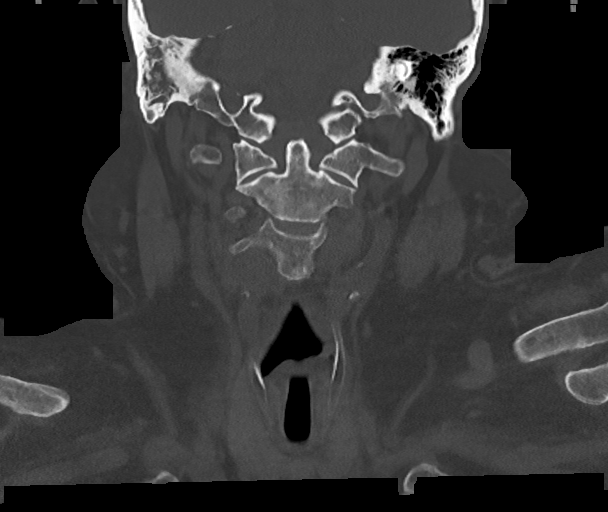

[Series 10: orthogonal axials · axial · 0.21mm/px · z∈[+1081,+1199]mm · 5 of 93 slices shown, 7 images]
[im 16/93  soft-tissue]
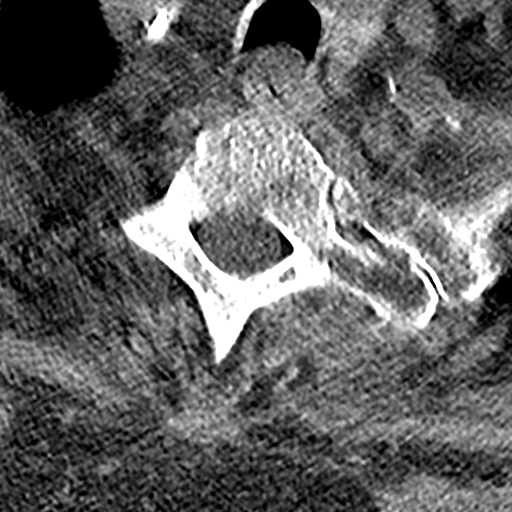
[im 16/93  bone]
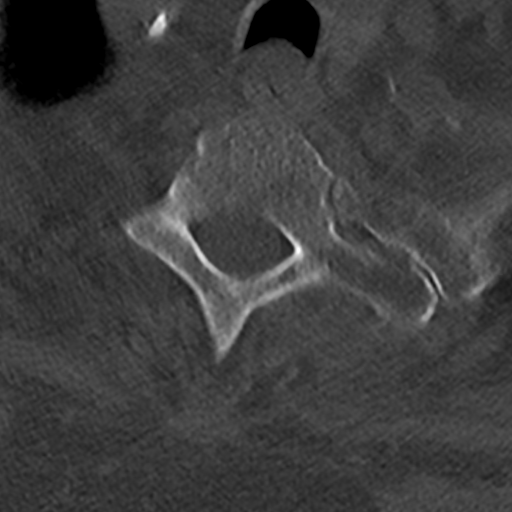
[im 31/93  bone]
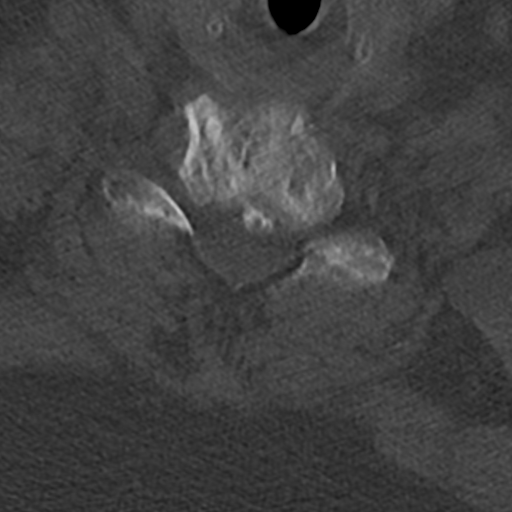
[im 47/93  bone]
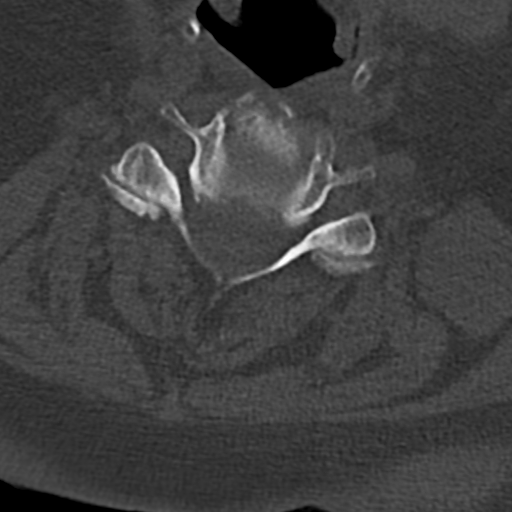
[im 62/93  bone]
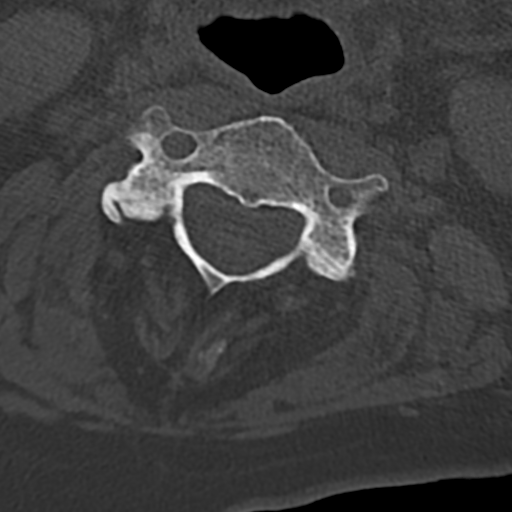
[im 77/93  soft-tissue]
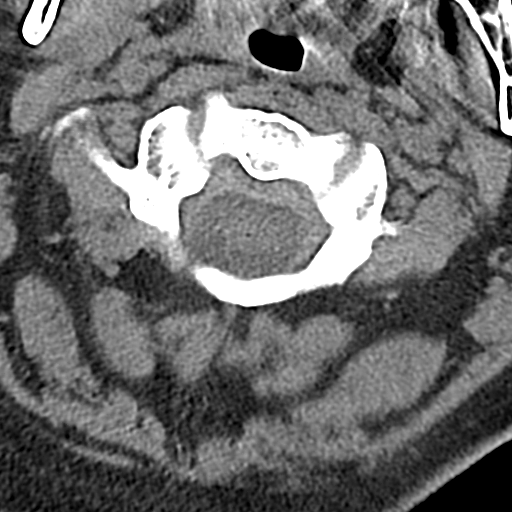
[im 77/93  bone]
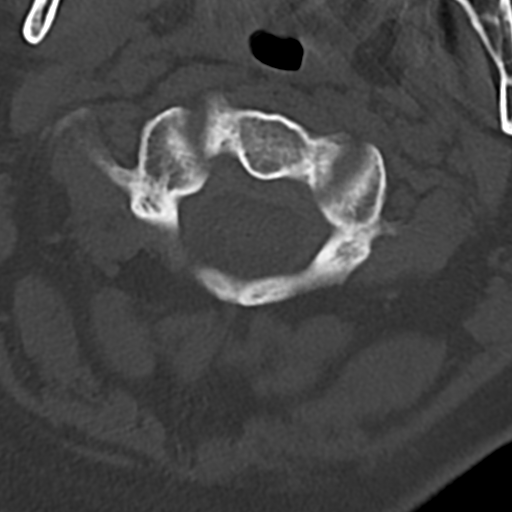

[13 of 33 positions shown; findings below may reference images not displayed]

FINDINGS: Alignment: Straightening of cervical lordosis. Cervicothoracic
junction alignment is within normal limits. Bilateral posterior
element alignment is within normal limits.

Skull base and vertebrae: Visualized skull base is intact. No
atlanto-occipital dissociation. No acute osseous abnormality
identified.

Soft tissues and spinal canal: No prevertebral fluid or swelling. No
visible canal hematoma. Calcified carotid atherosclerosis in the
neck with a partially retropharyngeal course of both carotids.

Disc levels: Widespread cervical disc and endplate degeneration.
There is some ossification of the posterior longitudinal ligament
(OPLL). Suspected in the lower cervical spine at C6 (sagittal image
57). At least mild associated spinal stenosis.

Upper chest: Visible upper thoracic levels appear intact. Negative
lung apices.
IMPRESSION: 1. No acute traumatic injury identified in the cervical spine.
2. Widespread cervical disc and endplate degeneration, with
ossification of the posterior longitudinal ligament (OPLL) and at
least mild spinal stenosis at C6.

## 2022-01-11 DIAGNOSIS — M4722 Other spondylosis with radiculopathy, cervical region: Secondary | ICD-10-CM | POA: Diagnosis not present

## 2022-01-11 DIAGNOSIS — G894 Chronic pain syndrome: Secondary | ICD-10-CM | POA: Diagnosis not present

## 2022-01-11 DIAGNOSIS — G63 Polyneuropathy in diseases classified elsewhere: Secondary | ICD-10-CM | POA: Diagnosis not present

## 2022-01-15 DIAGNOSIS — R110 Nausea: Secondary | ICD-10-CM | POA: Diagnosis not present

## 2022-01-15 DIAGNOSIS — G894 Chronic pain syndrome: Secondary | ICD-10-CM | POA: Diagnosis not present

## 2022-01-19 DIAGNOSIS — H353131 Nonexudative age-related macular degeneration, bilateral, early dry stage: Secondary | ICD-10-CM | POA: Diagnosis not present

## 2022-02-06 DIAGNOSIS — M4722 Other spondylosis with radiculopathy, cervical region: Secondary | ICD-10-CM | POA: Diagnosis not present

## 2022-02-06 DIAGNOSIS — G4733 Obstructive sleep apnea (adult) (pediatric): Secondary | ICD-10-CM | POA: Diagnosis not present

## 2022-02-06 DIAGNOSIS — G63 Polyneuropathy in diseases classified elsewhere: Secondary | ICD-10-CM | POA: Diagnosis not present

## 2022-02-08 DIAGNOSIS — G894 Chronic pain syndrome: Secondary | ICD-10-CM | POA: Diagnosis not present

## 2022-02-08 DIAGNOSIS — G63 Polyneuropathy in diseases classified elsewhere: Secondary | ICD-10-CM | POA: Diagnosis not present

## 2022-02-08 DIAGNOSIS — M4722 Other spondylosis with radiculopathy, cervical region: Secondary | ICD-10-CM | POA: Diagnosis not present

## 2022-02-10 DIAGNOSIS — M4722 Other spondylosis with radiculopathy, cervical region: Secondary | ICD-10-CM | POA: Diagnosis not present

## 2022-02-10 DIAGNOSIS — M6281 Muscle weakness (generalized): Secondary | ICD-10-CM | POA: Diagnosis not present

## 2022-02-14 DIAGNOSIS — Z91199 Patient's noncompliance with other medical treatment and regimen due to unspecified reason: Secondary | ICD-10-CM | POA: Diagnosis not present

## 2022-02-14 DIAGNOSIS — Z7189 Other specified counseling: Secondary | ICD-10-CM | POA: Diagnosis not present

## 2022-02-14 DIAGNOSIS — I10 Essential (primary) hypertension: Secondary | ICD-10-CM | POA: Diagnosis not present

## 2022-02-14 DIAGNOSIS — F418 Other specified anxiety disorders: Secondary | ICD-10-CM | POA: Diagnosis not present

## 2022-02-26 DIAGNOSIS — M6208 Separation of muscle (nontraumatic), other site: Secondary | ICD-10-CM | POA: Diagnosis not present

## 2022-02-26 DIAGNOSIS — K429 Umbilical hernia without obstruction or gangrene: Secondary | ICD-10-CM | POA: Diagnosis not present

## 2022-02-28 DIAGNOSIS — I509 Heart failure, unspecified: Secondary | ICD-10-CM | POA: Diagnosis not present

## 2022-02-28 DIAGNOSIS — Z23 Encounter for immunization: Secondary | ICD-10-CM | POA: Diagnosis not present

## 2022-02-28 DIAGNOSIS — I10 Essential (primary) hypertension: Secondary | ICD-10-CM | POA: Diagnosis not present

## 2022-02-28 DIAGNOSIS — E669 Obesity, unspecified: Secondary | ICD-10-CM | POA: Diagnosis not present

## 2022-02-28 DIAGNOSIS — K429 Umbilical hernia without obstruction or gangrene: Secondary | ICD-10-CM | POA: Diagnosis not present

## 2022-03-05 DIAGNOSIS — G63 Polyneuropathy in diseases classified elsewhere: Secondary | ICD-10-CM | POA: Diagnosis not present

## 2022-03-05 DIAGNOSIS — G894 Chronic pain syndrome: Secondary | ICD-10-CM | POA: Diagnosis not present

## 2022-03-05 DIAGNOSIS — M4722 Other spondylosis with radiculopathy, cervical region: Secondary | ICD-10-CM | POA: Diagnosis not present

## 2022-03-05 DIAGNOSIS — I739 Peripheral vascular disease, unspecified: Secondary | ICD-10-CM | POA: Diagnosis not present

## 2022-03-05 DIAGNOSIS — B351 Tinea unguium: Secondary | ICD-10-CM | POA: Diagnosis not present

## 2022-03-13 DIAGNOSIS — G63 Polyneuropathy in diseases classified elsewhere: Secondary | ICD-10-CM | POA: Diagnosis not present

## 2022-03-13 DIAGNOSIS — M4722 Other spondylosis with radiculopathy, cervical region: Secondary | ICD-10-CM | POA: Diagnosis not present

## 2022-03-26 DIAGNOSIS — K429 Umbilical hernia without obstruction or gangrene: Secondary | ICD-10-CM | POA: Diagnosis not present

## 2022-03-27 DIAGNOSIS — K429 Umbilical hernia without obstruction or gangrene: Secondary | ICD-10-CM | POA: Diagnosis not present

## 2022-03-30 DIAGNOSIS — G63 Polyneuropathy in diseases classified elsewhere: Secondary | ICD-10-CM | POA: Diagnosis not present

## 2022-03-30 DIAGNOSIS — G4733 Obstructive sleep apnea (adult) (pediatric): Secondary | ICD-10-CM | POA: Diagnosis not present

## 2022-03-30 DIAGNOSIS — M4722 Other spondylosis with radiculopathy, cervical region: Secondary | ICD-10-CM | POA: Diagnosis not present

## 2022-04-03 DIAGNOSIS — M4722 Other spondylosis with radiculopathy, cervical region: Secondary | ICD-10-CM | POA: Diagnosis not present

## 2022-04-03 DIAGNOSIS — G894 Chronic pain syndrome: Secondary | ICD-10-CM | POA: Diagnosis not present

## 2022-04-20 DIAGNOSIS — M4722 Other spondylosis with radiculopathy, cervical region: Secondary | ICD-10-CM | POA: Diagnosis not present

## 2022-04-20 DIAGNOSIS — G63 Polyneuropathy in diseases classified elsewhere: Secondary | ICD-10-CM | POA: Diagnosis not present

## 2022-05-04 DIAGNOSIS — G4733 Obstructive sleep apnea (adult) (pediatric): Secondary | ICD-10-CM | POA: Diagnosis not present

## 2022-05-04 DIAGNOSIS — M4722 Other spondylosis with radiculopathy, cervical region: Secondary | ICD-10-CM | POA: Diagnosis not present

## 2022-05-04 DIAGNOSIS — G63 Polyneuropathy in diseases classified elsewhere: Secondary | ICD-10-CM | POA: Diagnosis not present

## 2022-05-09 DIAGNOSIS — E785 Hyperlipidemia, unspecified: Secondary | ICD-10-CM | POA: Diagnosis not present

## 2022-05-09 DIAGNOSIS — I10 Essential (primary) hypertension: Secondary | ICD-10-CM | POA: Diagnosis not present

## 2022-05-09 DIAGNOSIS — E559 Vitamin D deficiency, unspecified: Secondary | ICD-10-CM | POA: Diagnosis not present

## 2022-05-09 DIAGNOSIS — E119 Type 2 diabetes mellitus without complications: Secondary | ICD-10-CM | POA: Diagnosis not present

## 2022-05-10 DIAGNOSIS — D649 Anemia, unspecified: Secondary | ICD-10-CM | POA: Diagnosis not present

## 2022-05-10 DIAGNOSIS — N1832 Chronic kidney disease, stage 3b: Secondary | ICD-10-CM | POA: Diagnosis not present

## 2022-05-22 DIAGNOSIS — G63 Polyneuropathy in diseases classified elsewhere: Secondary | ICD-10-CM | POA: Diagnosis not present

## 2022-06-01 DIAGNOSIS — G4733 Obstructive sleep apnea (adult) (pediatric): Secondary | ICD-10-CM | POA: Diagnosis not present

## 2022-06-01 DIAGNOSIS — E1142 Type 2 diabetes mellitus with diabetic polyneuropathy: Secondary | ICD-10-CM | POA: Diagnosis not present

## 2022-06-01 DIAGNOSIS — M4722 Other spondylosis with radiculopathy, cervical region: Secondary | ICD-10-CM | POA: Diagnosis not present

## 2022-06-05 DIAGNOSIS — R0981 Nasal congestion: Secondary | ICD-10-CM | POA: Diagnosis not present

## 2022-06-06 DIAGNOSIS — E119 Type 2 diabetes mellitus without complications: Secondary | ICD-10-CM | POA: Diagnosis not present

## 2022-06-06 DIAGNOSIS — R5383 Other fatigue: Secondary | ICD-10-CM | POA: Diagnosis not present

## 2022-06-06 DIAGNOSIS — I10 Essential (primary) hypertension: Secondary | ICD-10-CM | POA: Diagnosis not present

## 2022-06-22 DIAGNOSIS — E1142 Type 2 diabetes mellitus with diabetic polyneuropathy: Secondary | ICD-10-CM | POA: Diagnosis not present

## 2022-06-22 DIAGNOSIS — M4722 Other spondylosis with radiculopathy, cervical region: Secondary | ICD-10-CM | POA: Diagnosis not present

## 2022-06-22 DIAGNOSIS — G4733 Obstructive sleep apnea (adult) (pediatric): Secondary | ICD-10-CM | POA: Diagnosis not present

## 2022-07-04 DIAGNOSIS — I10 Essential (primary) hypertension: Secondary | ICD-10-CM | POA: Diagnosis not present

## 2022-07-04 DIAGNOSIS — I509 Heart failure, unspecified: Secondary | ICD-10-CM | POA: Diagnosis not present

## 2022-07-04 DIAGNOSIS — R609 Edema, unspecified: Secondary | ICD-10-CM | POA: Diagnosis not present

## 2022-07-04 DIAGNOSIS — F32A Depression, unspecified: Secondary | ICD-10-CM | POA: Diagnosis not present

## 2022-07-17 DIAGNOSIS — F419 Anxiety disorder, unspecified: Secondary | ICD-10-CM | POA: Diagnosis not present

## 2022-07-17 DIAGNOSIS — M4722 Other spondylosis with radiculopathy, cervical region: Secondary | ICD-10-CM | POA: Diagnosis not present

## 2022-07-20 DIAGNOSIS — G63 Polyneuropathy in diseases classified elsewhere: Secondary | ICD-10-CM | POA: Diagnosis not present

## 2022-07-30 DIAGNOSIS — M4722 Other spondylosis with radiculopathy, cervical region: Secondary | ICD-10-CM | POA: Diagnosis not present

## 2022-07-30 DIAGNOSIS — G4733 Obstructive sleep apnea (adult) (pediatric): Secondary | ICD-10-CM | POA: Diagnosis not present

## 2022-07-30 DIAGNOSIS — E1142 Type 2 diabetes mellitus with diabetic polyneuropathy: Secondary | ICD-10-CM | POA: Diagnosis not present

## 2022-08-01 DIAGNOSIS — K5900 Constipation, unspecified: Secondary | ICD-10-CM | POA: Diagnosis not present

## 2022-08-01 DIAGNOSIS — M25552 Pain in left hip: Secondary | ICD-10-CM | POA: Diagnosis not present

## 2022-08-01 DIAGNOSIS — R109 Unspecified abdominal pain: Secondary | ICD-10-CM | POA: Diagnosis not present

## 2022-08-01 DIAGNOSIS — M545 Low back pain, unspecified: Secondary | ICD-10-CM | POA: Diagnosis not present

## 2022-08-07 DIAGNOSIS — M4722 Other spondylosis with radiculopathy, cervical region: Secondary | ICD-10-CM | POA: Diagnosis not present

## 2022-08-07 DIAGNOSIS — G894 Chronic pain syndrome: Secondary | ICD-10-CM | POA: Diagnosis not present

## 2022-08-20 DIAGNOSIS — G63 Polyneuropathy in diseases classified elsewhere: Secondary | ICD-10-CM | POA: Diagnosis not present

## 2022-08-22 ENCOUNTER — Other Ambulatory Visit: Payer: Self-pay

## 2022-08-22 ENCOUNTER — Emergency Department (HOSPITAL_COMMUNITY): Payer: Medicare Other

## 2022-08-22 ENCOUNTER — Inpatient Hospital Stay (HOSPITAL_COMMUNITY)
Admission: EM | Admit: 2022-08-22 | Discharge: 2022-08-30 | DRG: 871 | Disposition: A | Discharge status: Skilled Nursing Facility | Payer: Medicare Other | Source: Skilled Nursing Facility | Attending: Internal Medicine | Admitting: Internal Medicine

## 2022-08-22 DIAGNOSIS — D124 Benign neoplasm of descending colon: Secondary | ICD-10-CM | POA: Diagnosis present

## 2022-08-22 DIAGNOSIS — I5032 Chronic diastolic (congestive) heart failure: Secondary | ICD-10-CM | POA: Diagnosis present

## 2022-08-22 DIAGNOSIS — D123 Benign neoplasm of transverse colon: Secondary | ICD-10-CM | POA: Diagnosis not present

## 2022-08-22 DIAGNOSIS — A419 Sepsis, unspecified organism: Principal | ICD-10-CM | POA: Diagnosis present

## 2022-08-22 DIAGNOSIS — R195 Other fecal abnormalities: Secondary | ICD-10-CM | POA: Diagnosis not present

## 2022-08-22 DIAGNOSIS — K5190 Ulcerative colitis, unspecified, without complications: Secondary | ICD-10-CM | POA: Diagnosis not present

## 2022-08-22 DIAGNOSIS — D125 Benign neoplasm of sigmoid colon: Secondary | ICD-10-CM

## 2022-08-22 DIAGNOSIS — N189 Chronic kidney disease, unspecified: Secondary | ICD-10-CM | POA: Diagnosis not present

## 2022-08-22 DIAGNOSIS — Z7984 Long term (current) use of oral hypoglycemic drugs: Secondary | ICD-10-CM | POA: Diagnosis not present

## 2022-08-22 DIAGNOSIS — E1129 Type 2 diabetes mellitus with other diabetic kidney complication: Secondary | ICD-10-CM | POA: Diagnosis not present

## 2022-08-22 DIAGNOSIS — D62 Acute posthemorrhagic anemia: Secondary | ICD-10-CM

## 2022-08-22 DIAGNOSIS — G8929 Other chronic pain: Secondary | ICD-10-CM | POA: Diagnosis present

## 2022-08-22 DIAGNOSIS — I9589 Other hypotension: Secondary | ICD-10-CM | POA: Diagnosis not present

## 2022-08-22 DIAGNOSIS — E1122 Type 2 diabetes mellitus with diabetic chronic kidney disease: Secondary | ICD-10-CM | POA: Diagnosis present

## 2022-08-22 DIAGNOSIS — J9690 Respiratory failure, unspecified, unspecified whether with hypoxia or hypercapnia: Secondary | ICD-10-CM | POA: Diagnosis not present

## 2022-08-22 DIAGNOSIS — G9341 Metabolic encephalopathy: Secondary | ICD-10-CM | POA: Diagnosis present

## 2022-08-22 DIAGNOSIS — G4733 Obstructive sleep apnea (adult) (pediatric): Secondary | ICD-10-CM | POA: Diagnosis present

## 2022-08-22 DIAGNOSIS — I10 Essential (primary) hypertension: Secondary | ICD-10-CM | POA: Diagnosis not present

## 2022-08-22 DIAGNOSIS — E785 Hyperlipidemia, unspecified: Secondary | ICD-10-CM | POA: Diagnosis present

## 2022-08-22 DIAGNOSIS — Z993 Dependence on wheelchair: Secondary | ICD-10-CM

## 2022-08-22 DIAGNOSIS — K922 Gastrointestinal hemorrhage, unspecified: Secondary | ICD-10-CM

## 2022-08-22 DIAGNOSIS — E119 Type 2 diabetes mellitus without complications: Secondary | ICD-10-CM | POA: Diagnosis not present

## 2022-08-22 DIAGNOSIS — Z6841 Body Mass Index (BMI) 40.0 and over, adult: Secondary | ICD-10-CM | POA: Diagnosis not present

## 2022-08-22 DIAGNOSIS — J9622 Acute and chronic respiratory failure with hypercapnia: Secondary | ICD-10-CM | POA: Diagnosis present

## 2022-08-22 DIAGNOSIS — R109 Unspecified abdominal pain: Secondary | ICD-10-CM | POA: Diagnosis not present

## 2022-08-22 DIAGNOSIS — N179 Acute kidney failure, unspecified: Secondary | ICD-10-CM | POA: Diagnosis present

## 2022-08-22 DIAGNOSIS — R6521 Severe sepsis with septic shock: Secondary | ICD-10-CM | POA: Diagnosis not present

## 2022-08-22 DIAGNOSIS — K5130 Ulcerative (chronic) rectosigmoiditis without complications: Secondary | ICD-10-CM | POA: Diagnosis present

## 2022-08-22 DIAGNOSIS — I878 Other specified disorders of veins: Secondary | ICD-10-CM | POA: Diagnosis present

## 2022-08-22 DIAGNOSIS — K5229 Other allergic and dietetic gastroenteritis and colitis: Secondary | ICD-10-CM | POA: Diagnosis not present

## 2022-08-22 DIAGNOSIS — Z91199 Patient's noncompliance with other medical treatment and regimen due to unspecified reason: Secondary | ICD-10-CM

## 2022-08-22 DIAGNOSIS — Z79899 Other long term (current) drug therapy: Secondary | ICD-10-CM | POA: Diagnosis not present

## 2022-08-22 DIAGNOSIS — K625 Hemorrhage of anus and rectum: Secondary | ICD-10-CM | POA: Diagnosis not present

## 2022-08-22 DIAGNOSIS — K529 Noninfective gastroenteritis and colitis, unspecified: Secondary | ICD-10-CM | POA: Diagnosis present

## 2022-08-22 DIAGNOSIS — Z9981 Dependence on supplemental oxygen: Secondary | ICD-10-CM

## 2022-08-22 DIAGNOSIS — J9621 Acute and chronic respiratory failure with hypoxia: Secondary | ICD-10-CM | POA: Diagnosis present

## 2022-08-22 DIAGNOSIS — M549 Dorsalgia, unspecified: Secondary | ICD-10-CM | POA: Diagnosis not present

## 2022-08-22 DIAGNOSIS — I130 Hypertensive heart and chronic kidney disease with heart failure and stage 1 through stage 4 chronic kidney disease, or unspecified chronic kidney disease: Secondary | ICD-10-CM | POA: Diagnosis present

## 2022-08-22 DIAGNOSIS — R5381 Other malaise: Secondary | ICD-10-CM | POA: Diagnosis present

## 2022-08-22 DIAGNOSIS — D630 Anemia in neoplastic disease: Secondary | ICD-10-CM | POA: Diagnosis not present

## 2022-08-22 DIAGNOSIS — R58 Hemorrhage, not elsewhere classified: Secondary | ICD-10-CM | POA: Diagnosis not present

## 2022-08-22 DIAGNOSIS — Z7401 Bed confinement status: Secondary | ICD-10-CM | POA: Diagnosis not present

## 2022-08-22 DIAGNOSIS — N320 Bladder-neck obstruction: Secondary | ICD-10-CM | POA: Diagnosis not present

## 2022-08-22 DIAGNOSIS — J9611 Chronic respiratory failure with hypoxia: Secondary | ICD-10-CM | POA: Diagnosis not present

## 2022-08-22 DIAGNOSIS — N182 Chronic kidney disease, stage 2 (mild): Secondary | ICD-10-CM | POA: Diagnosis present

## 2022-08-22 DIAGNOSIS — K6389 Other specified diseases of intestine: Secondary | ICD-10-CM | POA: Diagnosis present

## 2022-08-22 DIAGNOSIS — R0602 Shortness of breath: Secondary | ICD-10-CM | POA: Diagnosis not present

## 2022-08-22 DIAGNOSIS — E861 Hypovolemia: Secondary | ICD-10-CM | POA: Diagnosis present

## 2022-08-22 DIAGNOSIS — R578 Other shock: Secondary | ICD-10-CM | POA: Diagnosis not present

## 2022-08-22 DIAGNOSIS — R531 Weakness: Secondary | ICD-10-CM | POA: Diagnosis not present

## 2022-08-22 DIAGNOSIS — J45909 Unspecified asthma, uncomplicated: Secondary | ICD-10-CM | POA: Diagnosis present

## 2022-08-22 DIAGNOSIS — E875 Hyperkalemia: Secondary | ICD-10-CM | POA: Diagnosis not present

## 2022-08-22 DIAGNOSIS — Z743 Need for continuous supervision: Secondary | ICD-10-CM | POA: Diagnosis not present

## 2022-08-22 DIAGNOSIS — Z7982 Long term (current) use of aspirin: Secondary | ICD-10-CM

## 2022-08-22 DIAGNOSIS — D649 Anemia, unspecified: Secondary | ICD-10-CM | POA: Diagnosis not present

## 2022-08-22 DIAGNOSIS — I700 Atherosclerosis of aorta: Secondary | ICD-10-CM | POA: Diagnosis not present

## 2022-08-22 DIAGNOSIS — G9340 Encephalopathy, unspecified: Secondary | ICD-10-CM

## 2022-08-22 DIAGNOSIS — J9602 Acute respiratory failure with hypercapnia: Secondary | ICD-10-CM | POA: Diagnosis not present

## 2022-08-22 DIAGNOSIS — K420 Umbilical hernia with obstruction, without gangrene: Secondary | ICD-10-CM | POA: Diagnosis present

## 2022-08-22 DIAGNOSIS — M7989 Other specified soft tissue disorders: Secondary | ICD-10-CM | POA: Diagnosis not present

## 2022-08-22 DIAGNOSIS — R933 Abnormal findings on diagnostic imaging of other parts of digestive tract: Secondary | ICD-10-CM

## 2022-08-22 DIAGNOSIS — D126 Benign neoplasm of colon, unspecified: Secondary | ICD-10-CM | POA: Diagnosis not present

## 2022-08-22 LAB — CBC WITH DIFFERENTIAL/PLATELET
Abs Immature Granulocytes: 0.09 10*3/uL — ABNORMAL HIGH (ref 0.00–0.07)
Basophils Absolute: 0.1 10*3/uL (ref 0.0–0.1)
Basophils Relative: 1 %
Eosinophils Absolute: 0 10*3/uL (ref 0.0–0.5)
Eosinophils Relative: 0 %
HCT: 34.1 % — ABNORMAL LOW (ref 39.0–52.0)
Hemoglobin: 10.4 g/dL — ABNORMAL LOW (ref 13.0–17.0)
Immature Granulocytes: 1 %
Lymphocytes Relative: 6 %
Lymphs Abs: 1.1 10*3/uL (ref 0.7–4.0)
MCH: 28.5 pg (ref 26.0–34.0)
MCHC: 30.5 g/dL (ref 30.0–36.0)
MCV: 93.4 fL (ref 80.0–100.0)
Monocytes Absolute: 1.1 10*3/uL — ABNORMAL HIGH (ref 0.1–1.0)
Monocytes Relative: 7 %
Neutro Abs: 14.3 10*3/uL — ABNORMAL HIGH (ref 1.7–7.7)
Neutrophils Relative %: 85 %
Platelets: 287 10*3/uL (ref 150–400)
RBC: 3.65 MIL/uL — ABNORMAL LOW (ref 4.22–5.81)
RDW: 14.4 % (ref 11.5–15.5)
WBC: 16.7 10*3/uL — ABNORMAL HIGH (ref 4.0–10.5)
nRBC: 0 % (ref 0.0–0.2)

## 2022-08-22 LAB — COMPREHENSIVE METABOLIC PANEL
ALT: 16 U/L (ref 0–44)
AST: 17 U/L (ref 15–41)
Albumin: 2.7 g/dL — ABNORMAL LOW (ref 3.5–5.0)
Alkaline Phosphatase: 67 U/L (ref 38–126)
Anion gap: 11 (ref 5–15)
BUN: 91 mg/dL — ABNORMAL HIGH (ref 8–23)
CO2: 23 mmol/L (ref 22–32)
Calcium: 8.5 mg/dL — ABNORMAL LOW (ref 8.9–10.3)
Chloride: 102 mmol/L (ref 98–111)
Creatinine, Ser: 3.51 mg/dL — ABNORMAL HIGH (ref 0.61–1.24)
GFR, Estimated: 17 mL/min — ABNORMAL LOW (ref >60)
Glucose, Bld: 138 mg/dL — ABNORMAL HIGH (ref 70–99)
Potassium: 5.5 mmol/L — ABNORMAL HIGH (ref 3.5–5.1)
Sodium: 136 mmol/L (ref 135–145)
Total Bilirubin: 0.4 mg/dL (ref 0.3–1.2)
Total Protein: 6 g/dL — ABNORMAL LOW (ref 6.5–8.1)

## 2022-08-22 LAB — I-STAT ARTERIAL BLOOD GAS, ED
Acid-base deficit: 4 mmol/L — ABNORMAL HIGH (ref 0.0–2.0)
Bicarbonate: 24.7 mmol/L (ref 20.0–28.0)
Calcium, Ion: 1.26 mmol/L (ref 1.15–1.40)
HCT: 31 % — ABNORMAL LOW (ref 39.0–52.0)
Hemoglobin: 10.5 g/dL — ABNORMAL LOW (ref 13.0–17.0)
O2 Saturation: 96 %
Patient temperature: 97.9
Potassium: 5.4 mmol/L — ABNORMAL HIGH (ref 3.5–5.1)
Sodium: 135 mmol/L (ref 135–145)
TCO2: 27 mmol/L (ref 22–32)
pCO2 arterial: 61.3 mmHg — ABNORMAL HIGH (ref 32–48)
pH, Arterial: 7.212 — ABNORMAL LOW (ref 7.35–7.45)
pO2, Arterial: 97 mmHg (ref 83–108)

## 2022-08-22 LAB — TYPE AND SCREEN
ABO/RH(D): B POS
Antibody Screen: NEGATIVE

## 2022-08-22 LAB — GLUCOSE, CAPILLARY
Glucose-Capillary: 104 mg/dL — ABNORMAL HIGH (ref 70–99)
Glucose-Capillary: 98 mg/dL (ref 70–99)

## 2022-08-22 LAB — BRAIN NATRIURETIC PEPTIDE: B Natriuretic Peptide: 31.5 pg/mL (ref 0.0–100.0)

## 2022-08-22 LAB — LACTIC ACID, PLASMA: Lactic Acid, Venous: 1.2 mmol/L (ref 0.5–1.9)

## 2022-08-22 LAB — CBG MONITORING, ED: Glucose-Capillary: 114 mg/dL — ABNORMAL HIGH (ref 70–99)

## 2022-08-22 LAB — LIPASE, BLOOD: Lipase: 24 U/L (ref 11–51)

## 2022-08-22 MED ORDER — SODIUM CHLORIDE 0.9 % IV SOLN
250.0000 mL | INTRAVENOUS | Status: DC
  Administered 2022-08-22 – 2022-08-23 (×2): 250 mL via INTRAVENOUS

## 2022-08-22 MED ORDER — DOCUSATE SODIUM 100 MG PO CAPS: 100.0000 mg | ORAL_CAPSULE | Freq: Two times a day (BID) | ORAL | Status: DC | PRN

## 2022-08-22 MED ORDER — PIPERACILLIN-TAZOBACTAM 3.375 G IVPB 30 MIN: 3.3750 g | Freq: Once | INTRAVENOUS | Status: DC

## 2022-08-22 MED ORDER — PIPERACILLIN-TAZOBACTAM 3.375 G IVPB
3.3750 g | Freq: Once | INTRAVENOUS | Status: DC
  Administered 2022-08-22: 3.375 g via INTRAVENOUS
  Filled 2022-08-22: qty 50

## 2022-08-22 MED ORDER — NALOXONE HCL 4 MG/10ML IJ SOLN
1.0000 mg/h | INTRAVENOUS | Status: DC
  Filled 2022-08-22 (×2): qty 10

## 2022-08-22 MED ORDER — MORPHINE SULFATE (PF) 4 MG/ML IV SOLN
4.0000 mg | Freq: Once | INTRAVENOUS | Status: AC
  Administered 2022-08-22: 4 mg via INTRAVENOUS
  Filled 2022-08-22: qty 1

## 2022-08-22 MED ORDER — LACTATED RINGERS IV BOLUS
1000.0000 mL | Freq: Once | INTRAVENOUS | Status: AC
  Administered 2022-08-22: 1000 mL via INTRAVENOUS

## 2022-08-22 MED ORDER — CHLORHEXIDINE GLUCONATE CLOTH 2 % EX PADS
6.0000 | MEDICATED_PAD | Freq: Every day | CUTANEOUS | Status: DC
  Administered 2022-08-22 – 2022-08-30 (×9): 6 via TOPICAL

## 2022-08-22 MED ORDER — VANCOMYCIN VARIABLE DOSE PER UNSTABLE RENAL FUNCTION (PHARMACIST DOSING): Status: DC

## 2022-08-22 MED ORDER — HEPARIN SODIUM (PORCINE) 5000 UNIT/ML IJ SOLN
5000.0000 [IU] | Freq: Three times a day (TID) | INTRAMUSCULAR | Status: DC
  Administered 2022-08-22 – 2022-08-30 (×23): 5000 [IU] via SUBCUTANEOUS
  Filled 2022-08-22 (×22): qty 1

## 2022-08-22 MED ORDER — NALOXONE HCL 0.4 MG/ML IJ SOLN
0.4000 mg | Freq: Once | INTRAMUSCULAR | Status: AC
  Administered 2022-08-22: 0.4 mg via INTRAVENOUS
  Filled 2022-08-22: qty 1

## 2022-08-22 MED ORDER — ORAL CARE MOUTH RINSE: 15.0000 mL | OROMUCOSAL | Status: DC | PRN

## 2022-08-22 MED ORDER — NALOXONE HCL 2 MG/2ML IJ SOSY
2.0000 mg | PREFILLED_SYRINGE | Freq: Once | INTRAMUSCULAR | Status: AC
  Administered 2022-08-22: 2 mg via INTRAVENOUS
  Filled 2022-08-22: qty 2

## 2022-08-22 MED ORDER — PANTOPRAZOLE SODIUM 40 MG IV SOLR
40.0000 mg | Freq: Once | INTRAVENOUS | Status: AC
  Administered 2022-08-22: 40 mg via INTRAVENOUS
  Filled 2022-08-22: qty 10

## 2022-08-22 MED ORDER — VANCOMYCIN HCL 2000 MG/400ML IV SOLN
2000.0000 mg | Freq: Once | INTRAVENOUS | Status: AC
  Administered 2022-08-23: 2000 mg via INTRAVENOUS
  Filled 2022-08-22 (×2): qty 400

## 2022-08-22 MED ORDER — INSULIN ASPART 100 UNIT/ML IJ SOLN
0.0000 [IU] | INTRAMUSCULAR | Status: DC
  Administered 2022-08-23 – 2022-08-24 (×2): 1 [IU] via SUBCUTANEOUS
  Administered 2022-08-24: 2 [IU] via SUBCUTANEOUS

## 2022-08-22 MED ORDER — PIPERACILLIN-TAZOBACTAM IN DEX 2-0.25 GM/50ML IV SOLN
2.2500 g | Freq: Three times a day (TID) | INTRAVENOUS | Status: DC
  Administered 2022-08-23 – 2022-08-24 (×4): 2.25 g via INTRAVENOUS
  Filled 2022-08-22 (×7): qty 50

## 2022-08-22 MED ORDER — PANTOPRAZOLE SODIUM 40 MG IV SOLR
40.0000 mg | Freq: Two times a day (BID) | INTRAVENOUS | Status: DC
  Administered 2022-08-23 – 2022-08-25 (×5): 40 mg via INTRAVENOUS
  Filled 2022-08-22 (×3): qty 10

## 2022-08-22 MED ORDER — NOREPINEPHRINE 4 MG/250ML-% IV SOLN
0.0000 ug/min | INTRAVENOUS | Status: DC
  Administered 2022-08-22: 2 ug/min via INTRAVENOUS
  Filled 2022-08-22: qty 250

## 2022-08-22 MED ORDER — ONDANSETRON HCL 4 MG/2ML IJ SOLN
4.0000 mg | Freq: Once | INTRAMUSCULAR | Status: AC
  Administered 2022-08-22: 4 mg via INTRAVENOUS
  Filled 2022-08-22: qty 2

## 2022-08-22 MED ORDER — NOREPINEPHRINE 4 MG/250ML-% IV SOLN
2.0000 ug/min | INTRAVENOUS | Status: DC
  Administered 2022-08-23: 8 ug/min via INTRAVENOUS
  Administered 2022-08-23: 2 ug/min via INTRAVENOUS
  Administered 2022-08-24: 5 ug/min via INTRAVENOUS
  Filled 2022-08-22 (×3): qty 250

## 2022-08-22 MED ORDER — POLYETHYLENE GLYCOL 3350 17 G PO PACK: 17.0000 g | PACK | Freq: Every day | ORAL | Status: DC | PRN

## 2022-08-22 NOTE — ED Notes (Signed)
POC occult positive. Results not crossing over. RN aware.

## 2022-08-22 NOTE — ED Provider Notes (Signed)
[place] EMERGENCY DEPARTMENT AT [facility] Provider Note   CSN: [id number] Arrival date & time: 08/22/22  1620     History  Chief Complaint  Patient presents with   Rectal Bleeding    Brian Silva is a 82 y.o. male.  With PMH of HTN, HLD, DM, CKD, asthma/chronic respiratory failure on 2 L nasal cannula, morbid obesity brought in by EMS from [facility] with concerns for 2 episodes of rectal bleeding today.  Patient denies any history of internal bleeding.  Had 2 bowel movements today but did not see them so he does not know what they look like.  He is not on any anticoagulation or antiplatelets.  He has been complaining of upper epigastrium and left lower quadrant pain over the past 4 days.  Feels like a dull aching chronic pain.  Had 1 episode of nonbloody nonbilious emesis last night.  He denies any alcohol use.  Denies any history of cirrhosis or liver disease.  He denies any chest pain or shortness of breath.   Rectal Bleeding      Home Medications Prior to Admission medications   Medication Sig Start Date End Date Taking? Authorizing Provider  acetaminophen (TYLENOL) 500 MG tablet Take 2 tablets (1,000 mg total) by mouth every 8 (eight) hours. Patient taking differently: Take 1,000 mg by mouth every 8 (eight) hours as needed for mild pain. 09/14/19   Rhetta Mura, MD  albuterol (PROVENTIL HFA;VENTOLIN HFA) 108 (90 Base) MCG/ACT inhaler Inhale 2 puffs into the lungs every 6 (six) hours as needed. For shortness of breath 10/28/15   Dorothea Ogle, MD  amLODipine (NORVASC) 10 MG tablet Take 10 mg by mouth daily.    [provider]  aspirin EC 81 MG tablet Take 81 mg by mouth daily.    [provider]  atorvastatin (LIPITOR) 20 MG tablet Take 20 mg by mouth at bedtime.    [provider]  azithromycin (ZITHROMAX) 250 MG tablet daily 09/11/21   Marinda Elk, MD  benzocaine-menthol (CHLORAEPTIC) 6-10 MG lozenge Take 1 lozenge by  mouth every 2 (two) hours as needed for sore throat.    [provider]  Cholecalciferol (VITAMIN D3) 125 MCG (5000 UT) CAPS Take 5,000 Units by mouth daily. 06/07/20   [provider]  docusate sodium (COLACE) 100 MG capsule Take 100 mg by mouth 2 (two) times daily as needed (for constipation).    [provider]  DULoxetine (CYMBALTA) 20 MG capsule Take 20 mg by mouth daily.    [provider]  folic acid (FOLVITE) 1 MG tablet Take 1 tablet (1 mg total) by mouth daily. 07/11/20   Osvaldo Shipper, MD  furosemide (LASIX) 40 MG tablet Take 40 mg by mouth 2 (two) times daily.    [provider]  ipratropium-albuterol (DUONEB) 0.5-2.5 (3) MG/3ML SOLN Take 3 mLs by nebulization every 6 (six) hours as needed for shortness of breath or wheezing. 07/04/20   [provider]  Menthol, Topical Analgesic, (BIOFREEZE) 4 % GEL Apply 1 application topically every 6 (six) hours as needed (joint pain).    [provider]  nystatin (MYCOSTATIN/NYSTOP) powder Apply 1 application topically See admin instructions. Apply to abdominal folds twice daily for redness or rash Patient not taking: Reported on 09/11/2021    [provider]  ondansetron (ZOFRAN) 4 MG tablet Take 4 mg by mouth every 8 (eight) hours as needed for nausea or vomiting.    [provider]  pantoprazole (PROTONIX) 20 MG tablet Take 20 mg by mouth daily. 06/30/20   [provider]  polyethylene glycol (MIRALAX / GLYCOLAX) 17 g packet Take 17 g by mouth daily as needed for mild constipation. Patient not taking: Reported on 09/11/2021    [provider]  pregabalin (LYRICA) 75 MG capsule Take 75 mg by mouth 3 (three) times daily. Patient not taking: Reported on 09/11/2021    [provider]  traMADol (ULTRAM) 50 MG tablet Take 1 tablet (50 mg total) by mouth every 8 (eight) hours as needed for moderate pain. Patient not taking: Reported on 09/11/2021  07/11/20   Osvaldo Shipper, MD  vitamin B-12 (CYANOCOBALAMIN) 500 MCG tablet Take 1 tablet (500 mcg total) by mouth daily. Patient not taking: Reported on 09/11/2021 07/11/20   Osvaldo Shipper, MD      Allergies    Patient has no known allergies.    Review of Systems   Review of Systems  Gastrointestinal:  Positive for hematochezia.    Physical Exam Updated Vital Signs There were no vitals taken for this visit. Physical Exam Constitutional: Alert and oriented.  Chronically ill but no acute distress, pale appearing male Eyes: Conjunctivae are normal. ENT      Head: Normocephalic and atraumatic. Cardiovascular: S1, S2, regular rate and rhythm, warm well-perfused Respiratory: Normal respiratory effort.  Decreased breath sounds limited by body habitus.  On O2 2 L nasal cannula Gastrointestinal: Soft and obese with epigastrium, left upper quadrant and left lower quadrant tenderness worse in the lower left lower quadrant.  No rebound or guarding.  Rectal exam performed with bedside RN Brian Silva, not much stool in rectal vault, mucousy clear/yellow stool mixed with mild blood Musculoskeletal: Normal range of motion in all extremities. Chronic venous stasis changes of bilateral lower extremities Neurologic: Normal speech and language.  No facial droop.  Moving all 4 extremities equally.  Sensation grossly intact.  No gross focal neurologic deficits are appreciated. Skin: Skin is warm, dry and pale Psychiatric: Mood and affect are normal. Speech and behavior are normal.  ED Results / Procedures / Treatments   Labs (all labs ordered are listed, but only abnormal results are displayed) Labs Reviewed  COMPREHENSIVE METABOLIC PANEL  CBC WITH DIFFERENTIAL/PLATELET  LIPASE, BLOOD  URINALYSIS, ROUTINE W REFLEX MICROSCOPIC  BRAIN NATRIURETIC PEPTIDE  TYPE AND SCREEN    EKG None  Radiology No results found.  Procedures Ultrasound ED Peripheral IV (Provider)  Date/Time: 08/22/2022 5:36  PM  Performed by: Mardene Sayer, MD Authorized by: Mardene Sayer, MD   Procedure details:    Indications: poor IV access     Skin Prep: chlorhexidine gluconate     Location:  Left AC   Angiocath:  20 G   Bedside Ultrasound Guided: Yes     Images: not archived     Patient tolerated procedure without complications: No     Dressing applied: Yes       Medications Ordered in ED Medications  morphine (PF) 4 MG/ML injection 4 mg (has no administration in time range)  ondansetron (ZOFRAN) injection 4 mg (has no administration in time range)    ED Course/ Medical Decision Making/ A&P                             Medical Decision Making AHMERE HEMENWAY is a 82 y.o. male.  With PMH of HTN, HLD, DM, CKD, asthma/chronic respiratory failure on 2 L nasal  cannula, morbid obesity brought in by EMS from [facility] with concerns for 2 episodes of rectal bleeding today.   Differential for lower GI bleed especially with associated left lower quadrant pain includes but not limited to diverticular bleed, infectious colitis, internal hemorrhoids, colorectal cancer among multiple other etiologies.     Amount and/or Complexity of Data Reviewed Labs: ordered. Radiology: ordered.  Risk Prescription drug management.     Final Clinical Impression(s) / ED Diagnoses Final diagnoses:  Rectal bleeding    Rx / DC Orders ED Discharge Orders     None

## 2022-08-22 NOTE — H&P (Addendum)
NAME:  Brian Silva, MRN:  [id number], DOB:  07-28-1940, LOS: 0 ADMISSION DATE:  08/22/2022, CONSULTATION DATE:  5/1 REFERRING MD:  Dr. Elpidio Anis, CHIEF COMPLAINT:  hypotension   History of Present Illness:  82 year old male with PMH as below, which is significant for, chronic respiratory failure on 2L, OSA not on CPAP, chronic incarcerated umbilical hernia, wheelchair bound, and HTN. He resides in [facility]. Presented to [facility] ED via EMS 5/1 with complaints of rectal bleeding x 2 episodes. Also had one episode of non-bloody emesis the night prior and abdominal pain for approximately 4 days. Upon arrival to the emergency department he had stable vital signs and underwent workup for GIB and abd pain. Laboratory evaluation was significant for K 5.5, Creatinine 3.51, BUN 91, WBC 16.7, and hemoglobin 10.4. CT of the abdomen and pelvis was remarkable for proctocolitis. Empiric antibiotics were initiated. He did become hypotensive despite 2L IVF resuscitation requiring low dose norepinephrine infusion. PCCM was asked to evaluate for ongoing care.   Pertinent  Medical History   has a past medical history of Asthma, Chronic respiratory failure (HCC), Hyperlipidemia, Hypertension, Obese, OSA (obstructive sleep apnea), and Venous stasis.   Significant Hospital Events: Including procedures, antibiotic start and stop dates in addition to other pertinent events   5/1 admit  Interim History / Subjective:    Objective   Blood pressure (!) 97/48, pulse 70, temperature 97.9 F (36.6 C), temperature source Rectal, resp. rate 16, SpO2 100 %.       No intake or output data in the 24 hours ending 08/22/22 2059 There were no vitals filed for this visit.  Examination: General: Morbidly obese male HENT: [place]/AT, Pupils pinpoint, unable to appreciate JVD Lungs: Sonorous. Clear otherwise.  Cardiovascular: RRR, no MRG Abdomen: Umbilical hernia, soft, ND Extremities: No acute deformity or  edema Neuro: Obtunded.   Resolved Hospital Problem list     Assessment & Plan:   Acute metabolic encephalopathy secondary to hypercarbia. Known OSA, now with renal failure and received morphine. Did respond to Narcan, but remains somnolent.  - Admit to ICU for close monitoring - Narcan infusion  Acute hypercarbic respiratory failure Chronic hypoxemic respiratory failure - Watch closely in ICU - Low threshold for intubation vs NIV if worsens - Narcan infusion as above - Continue home 2 L  Septic shock secondary to proctocolitis. S/p 2.5 L IVF. End-organ damage, renal - Has been volume resuscitated.  - NE for MAP goal 65 mmHg (currently maintaining on 2 mcg) - Empiric Zosyn, low threshold to DC vanco.  - Blood cultures pending  GIB: sounds like BRBPR, but no clear history on this.  - Protonix BID - Consult GI once more stable.  - Holding ASA - DC Celebrex  AKI on CKD: likely pre-renal azotemia. No signs of obstruction on CT.  - BP support - DC Celebrex - Trend BMP  Chronic HFpEF Hypertension - holding home antihypertensives - hold lasix - Hold statin while NPO  Best Practice (right click and "Reselect all SmartList Selections" daily)   Diet/type: NPO DVT prophylaxis: prophylactic heparin  GI prophylaxis: PPI Lines: N/A Foley:  N/A Code Status:  full code Last date of multidisciplinary goals of care discussion [ ]  Unable to contact Pam who is listed as his emergency contact.   Labs   CBC: Recent Labs  Lab 08/22/22 1708  WBC 16.7*  NEUTROABS 14.3*  HGB 10.4*  HCT 34.1*  MCV 93.4  PLT 287  Basic Metabolic Panel: Recent Labs  Lab 08/22/22 1708  NA 136  K 5.5*  CL 102  CO2 23  GLUCOSE 138*  BUN 91*  CREATININE 3.51*  CALCIUM 8.5*   GFR: CrCl cannot be calculated (Unknown ideal weight.). Recent Labs  Lab 08/22/22 1708 08/22/22 1809  WBC 16.7*  --   LATICACIDVEN  --  1.2    Liver Function Tests: Recent Labs  Lab 08/22/22 1708  AST  17  ALT 16  ALKPHOS 67  BILITOT 0.4  PROT 6.0*  ALBUMIN 2.7*   Recent Labs  Lab 08/22/22 1708  LIPASE 24   No results for input(s): "AMMONIA" in the last 168 hours.  ABG    Component Value Date/Time   TCO2 31 09/10/2019 1724     Coagulation Profile: No results for input(s): "INR", "PROTIME" in the last 168 hours.  Cardiac Enzymes: No results for input(s): "CKTOTAL", "CKMB", "CKMBINDEX", "TROPONINI" in the last 168 hours.  HbA1C: Hgb A1c MFr Bld  Date/Time Value Ref Range Status  12/23/2019 05:11 AM 5.7 (H) 4.8 - 5.6 % Final    Comment:    (NOTE) Pre diabetes:          5.7%-6.4%  Diabetes:              >6.4%  Glycemic control for   <7.0% adults with diabetes   09/11/2019 06:01 AM 6.4 (H) 4.8 - 5.6 % Final    Comment:    (NOTE) Pre diabetes:          5.7%-6.4% Diabetes:              >6.4% Glycemic control for   <7.0% adults with diabetes     CBG: No results for input(s): "GLUCAP" in the last 168 hours.  Review of Systems:   Patient is encephalopathic and/or intubated; therefore, history has been obtained from chart review.    Past Medical History:  He,  has a past medical history of Asthma, Chronic respiratory failure (HCC), Hyperlipidemia, Hypertension, Obese, OSA (obstructive sleep apnea), and Venous stasis.   Surgical History:  No past surgical history on file.   Social History:   reports that he has never smoked. He has never used smokeless tobacco. He reports current alcohol use. He reports that he does not use drugs.   Family History:  His family history is not on file.   Allergies No Known Allergies   Home Medications  Prior to Admission medications   Medication Sig Start Date End Date Taking? Authorizing Provider  acetaminophen (TYLENOL) 500 MG tablet Take 2 tablets (1,000 mg total) by mouth every 8 (eight) hours. Patient taking differently: Take 1,000 mg by mouth every 8 (eight) hours as needed for mild pain. 09/14/19   Rhetta Mura, MD  albuterol (PROVENTIL HFA;VENTOLIN HFA) 108 (90 Base) MCG/ACT inhaler Inhale 2 puffs into the lungs every 6 (six) hours as needed. For shortness of breath 10/28/15   Dorothea Ogle, MD  amLODipine (NORVASC) 10 MG tablet Take 10 mg by mouth daily.    [provider]  aspirin EC 81 MG tablet Take 81 mg by mouth daily.    [provider]  atorvastatin (LIPITOR) 20 MG tablet Take 20 mg by mouth at bedtime.    [provider]  azithromycin (ZITHROMAX) 250 MG tablet daily 09/11/21   Marinda Elk, MD  benzocaine-menthol (CHLORAEPTIC) 6-10 MG lozenge Take 1 lozenge by mouth every 2 (two) hours as needed for sore throat.  [provider]  Cholecalciferol (VITAMIN D3) 125 MCG (5000 UT) CAPS Take 5,000 Units by mouth daily. 06/07/20   [provider]  docusate sodium (COLACE) 100 MG capsule Take 100 mg by mouth 2 (two) times daily as needed (for constipation).    [provider]  DULoxetine (CYMBALTA) 20 MG capsule Take 20 mg by mouth daily.    [provider]  folic acid (FOLVITE) 1 MG tablet Take 1 tablet (1 mg total) by mouth daily. 07/11/20   Osvaldo Shipper, MD  furosemide (LASIX) 40 MG tablet Take 40 mg by mouth 2 (two) times daily.    [provider]  ipratropium-albuterol (DUONEB) 0.5-2.5 (3) MG/3ML SOLN Take 3 mLs by nebulization every 6 (six) hours as needed for shortness of breath or wheezing. 07/04/20   [provider]  Menthol, Topical Analgesic, (BIOFREEZE) 4 % GEL Apply 1 application topically every 6 (six) hours as needed (joint pain).    [provider]  nystatin (MYCOSTATIN/NYSTOP) powder Apply 1 application topically See admin instructions. Apply to abdominal folds twice daily for redness or rash Patient not taking: Reported on 09/11/2021    [provider]  ondansetron (ZOFRAN) 4 MG tablet Take 4 mg by mouth every 8 (eight) hours as needed for nausea or vomiting.    [provider]  pantoprazole (PROTONIX) 20 MG tablet Take 20 mg by mouth daily. 06/30/20   [provider]  polyethylene glycol (MIRALAX / GLYCOLAX) 17 g packet Take 17 g by mouth daily as needed for mild constipation. Patient not taking: Reported on 09/11/2021    [provider]  pregabalin (LYRICA) 75 MG capsule Take 75 mg by mouth 3 (three) times daily. Patient not taking: Reported on 09/11/2021    [provider]  traMADol (ULTRAM) 50 MG tablet Take 1 tablet (50 mg total) by mouth every 8 (eight) hours as needed for moderate pain. Patient not taking: Reported on 09/11/2021 07/11/20   Osvaldo Shipper, MD  vitamin B-12 (CYANOCOBALAMIN) 500 MCG tablet Take 1 tablet (500 mcg total) by mouth daily. Patient not taking: Reported on 09/11/2021 07/11/20   Osvaldo Shipper, MD     Critical care time: 51 minutes     Joneen Roach, AGACNP-BC [facility]  See Amion for personal pager PCCM on call pager [phone] until 7pm. Please call Elink 7p-7a. [phone]  08/22/2022 8:59 PM

## 2022-08-22 NOTE — Progress Notes (Addendum)
eLink Physician-Brief Progress Note Patient Name: Brian Silva DOB: November 21, 1940 MRN: [id number]   Date of Service  08/22/2022  HPI/Events of Note  Patient now in ICU. Awake, a little confused but following commands. On 2 mic/min levophed. Did not need narcan drip, has not been on it. RR is 20. O2 sat 97 . SBP 120s. HR in 80s. Reviewed CCM note  eICU Interventions  Hold narcan since fully awake and not hypoventilating  Recheck ABG at 1130 pm  Keep NPO Other plan per ccm notes Assess for NIV with next abg  D/w RN plz inform us when results obtained     Intervention Category Major Interventions: Respiratory failure - evaluation and management;Shock - evaluation and management Evaluation Type: New Patient Evaluation  Oretha Milch 08/22/2022, 10:45 PM  Addendum at 4:20 am : has some belly pain. Was given narcan earlier so avoiding opioids . This is likely from his colitis. Will try IV tylenol. Prior ordered ABG has still not resulted.

## 2022-08-22 NOTE — ED Triage Notes (Signed)
Patient BIB [facility] with c/o rectal bleeding x2 unknown color of stool. C/O ABD pain. VSS. CBG 168. 2liters 98%. Denies SOB

## 2022-08-22 NOTE — Progress Notes (Signed)
Left arm assessed with [name]. No suitable veins visualized. Right arm assessed with [name]. Only suitable veins visualized was RAC. USGPIV placed in RAC. Advised unit RN that if additional access was needed that this IV team RN would recommend placing a CL.

## 2022-08-22 NOTE — Progress Notes (Addendum)
Pharmacy Antibiotic Note  BREYDON SENTERS is a 82 y.o. male admitted on 08/22/2022 with concern for intra abdominal infection and/or sepsis.  Pharmacy has been consulted for Vancomycin and Zosyn dosing.   His last Scr was 3.51 - an increase from his baseline of 1.7-1.8. Last weight of 165kg (used 73kg IBW) in May 2022, height of 5' 10''.   Plan: Give Vancomycin 2000mg  x 1 - follow up to schedule pending renal improvement  Give Zosyn 3.375g x 1 over 30 minutes then every 8 hours at reduced dose  Follow up signs and symptoms of infection Follow up culture results to narrow  Follow up renal function to adjust antibiotics as indicated     No data recorded.  Recent Labs  Lab 08/22/22 1708  WBC 16.7*    CrCl cannot be calculated (Patient's most recent lab result is older than the maximum 21 days allowed.).    No Known Allergies   Microbiology results: 5/1 BCx: sent  Thank you for allowing pharmacy to be a part of this patient's care.  Blane Ohara, PharmD  PGY1 Pharmacy Resident

## 2022-08-23 LAB — OCCULT BLOOD, POC DEVICE
Fecal Occult Bld: POSITIVE — AB
Fecal Occult Bld: POSITIVE — AB

## 2022-08-23 LAB — BLOOD CULTURE ID PANEL (REFLEXED) - BCID2

## 2022-08-23 LAB — BASIC METABOLIC PANEL
Anion gap: 12 (ref 5–15)
Anion gap: 13 (ref 5–15)
Anion gap: 10 (ref 5–15)
BUN: 82 mg/dL — ABNORMAL HIGH (ref 8–23)
BUN: 85 mg/dL — ABNORMAL HIGH (ref 8–23)
BUN: 79 mg/dL — ABNORMAL HIGH (ref 8–23)
CO2: 21 mmol/L — ABNORMAL LOW (ref 22–32)
CO2: 19 mmol/L — ABNORMAL LOW (ref 22–32)
CO2: 24 mmol/L (ref 22–32)
Calcium: 8.4 mg/dL — ABNORMAL LOW (ref 8.9–10.3)
Calcium: 8.6 mg/dL — ABNORMAL LOW (ref 8.9–10.3)
Calcium: 8.5 mg/dL — ABNORMAL LOW (ref 8.9–10.3)
Chloride: 101 mmol/L (ref 98–111)
Chloride: 104 mmol/L (ref 98–111)
Chloride: 101 mmol/L (ref 98–111)
Creatinine, Ser: 3.33 mg/dL — ABNORMAL HIGH (ref 0.61–1.24)
Creatinine, Ser: 3.28 mg/dL — ABNORMAL HIGH (ref 0.61–1.24)
Creatinine, Ser: 3.09 mg/dL — ABNORMAL HIGH (ref 0.61–1.24)
GFR, Estimated: 18 mL/min — ABNORMAL LOW (ref >60)
GFR, Estimated: 18 mL/min — ABNORMAL LOW (ref >60)
GFR, Estimated: 20 mL/min — ABNORMAL LOW (ref >60)
Glucose, Bld: 133 mg/dL — ABNORMAL HIGH (ref 70–99)
Glucose, Bld: 136 mg/dL — ABNORMAL HIGH (ref 70–99)
Glucose, Bld: 109 mg/dL — ABNORMAL HIGH (ref 70–99)
Potassium: 5.2 mmol/L — ABNORMAL HIGH (ref 3.5–5.1)
Potassium: 6.4 mmol/L (ref 3.5–5.1)
Potassium: 4.9 mmol/L (ref 3.5–5.1)
Sodium: 134 mmol/L — ABNORMAL LOW (ref 135–145)
Sodium: 136 mmol/L (ref 135–145)
Sodium: 135 mmol/L (ref 135–145)

## 2022-08-23 LAB — GLUCOSE, CAPILLARY
Glucose-Capillary: 104 mg/dL — ABNORMAL HIGH (ref 70–99)
Glucose-Capillary: 104 mg/dL — ABNORMAL HIGH (ref 70–99)
Glucose-Capillary: 106 mg/dL — ABNORMAL HIGH (ref 70–99)
Glucose-Capillary: 114 mg/dL — ABNORMAL HIGH (ref 70–99)
Glucose-Capillary: 137 mg/dL — ABNORMAL HIGH (ref 70–99)
Glucose-Capillary: 91 mg/dL (ref 70–99)

## 2022-08-23 LAB — PROTIME-INR
INR: 1.2 (ref 0.8–1.2)
Prothrombin Time: 15 seconds (ref 11.4–15.2)

## 2022-08-23 LAB — CBC WITH DIFFERENTIAL/PLATELET
Abs Immature Granulocytes: 0.1 10*3/uL — ABNORMAL HIGH (ref 0.00–0.07)
Basophils Absolute: 0.1 10*3/uL (ref 0.0–0.1)
Basophils Relative: 1 %
Eosinophils Absolute: 0.2 10*3/uL (ref 0.0–0.5)
Eosinophils Relative: 1 %
HCT: 33.3 % — ABNORMAL LOW (ref 39.0–52.0)
Hemoglobin: 10.1 g/dL — ABNORMAL LOW (ref 13.0–17.0)
Immature Granulocytes: 1 %
Lymphocytes Relative: 13 %
Lymphs Abs: 2.3 10*3/uL (ref 0.7–4.0)
MCH: 28.1 pg (ref 26.0–34.0)
MCHC: 30.3 g/dL (ref 30.0–36.0)
MCV: 92.8 fL (ref 80.0–100.0)
Monocytes Absolute: 1.3 10*3/uL — ABNORMAL HIGH (ref 0.1–1.0)
Monocytes Relative: 7 %
Neutro Abs: 13.4 10*3/uL — ABNORMAL HIGH (ref 1.7–7.7)
Neutrophils Relative %: 77 %
Platelets: 302 10*3/uL (ref 150–400)
RBC: 3.59 MIL/uL — ABNORMAL LOW (ref 4.22–5.81)
RDW: 14.4 % (ref 11.5–15.5)
WBC: 17.4 10*3/uL — ABNORMAL HIGH (ref 4.0–10.5)
nRBC: 0 % (ref 0.0–0.2)

## 2022-08-23 LAB — HEMOGLOBIN A1C
Hgb A1c MFr Bld: 6.2 % — ABNORMAL HIGH (ref 4.8–5.6)
Mean Plasma Glucose: 131.24 mg/dL

## 2022-08-23 LAB — LACTIC ACID, PLASMA: Lactic Acid, Venous: 1.1 mmol/L (ref 0.5–1.9)

## 2022-08-23 LAB — MRSA NEXT GEN BY PCR, NASAL: MRSA by PCR Next Gen: DETECTED — AB

## 2022-08-23 LAB — CORTISOL: Cortisol, Plasma: 15.3 ug/dL

## 2022-08-23 MED ORDER — ACETAMINOPHEN 10 MG/ML IV SOLN
1000.0000 mg | Freq: Once | INTRAVENOUS | Status: AC
  Administered 2022-08-23: 1000 mg via INTRAVENOUS
  Filled 2022-08-23: qty 100

## 2022-08-23 MED ORDER — MUPIROCIN 2 % EX OINT
1.0000 | TOPICAL_OINTMENT | Freq: Two times a day (BID) | CUTANEOUS | Status: AC
  Administered 2022-08-23 – 2022-08-27 (×10): 1 via NASAL
  Filled 2022-08-23 (×4): qty 22

## 2022-08-23 MED ORDER — GERHARDT'S BUTT CREAM
TOPICAL_CREAM | Freq: Two times a day (BID) | CUTANEOUS | Status: DC
  Administered 2022-08-23: 1 via TOPICAL
  Filled 2022-08-23 (×2): qty 1

## 2022-08-23 MED ORDER — LACTATED RINGERS IV BOLUS
500.0000 mL | Freq: Once | INTRAVENOUS | Status: AC
  Administered 2022-08-23: 500 mL via INTRAVENOUS

## 2022-08-23 MED ORDER — SODIUM ZIRCONIUM CYCLOSILICATE 10 G PO PACK
10.0000 g | PACK | Freq: Once | ORAL | Status: AC
  Administered 2022-08-23: 10 g via ORAL
  Filled 2022-08-23: qty 1

## 2022-08-23 NOTE — Progress Notes (Signed)
NAME:  Brian Silva, MRN:  [id number], DOB:  May 01, 1940, LOS: 1 ADMISSION DATE:  08/22/2022, CONSULTATION DATE:  5/1 REFERRING MD:  Dr. Elpidio Anis, CHIEF COMPLAINT:  hypotension   History of Present Illness:  82 year old male with PMH as below, which is significant for, chronic respiratory failure on 2L, OSA not on CPAP, chronic incarcerated umbilical hernia, wheelchair bound, and HTN. He resides in [facility]. Presented to [facility] ED via EMS 5/1 with complaints of rectal bleeding x 2 episodes. Also had one episode of non-bloody emesis the night prior and abdominal pain for approximately 4 days. Upon arrival to the emergency department he had stable vital signs and underwent workup for GIB and abd pain. Laboratory evaluation was significant for K 5.5, Creatinine 3.51, BUN 91, WBC 16.7, and hemoglobin 10.4. CT of the abdomen and pelvis was remarkable for proctocolitis. Empiric antibiotics were initiated. He did become hypotensive despite 2L IVF resuscitation requiring low dose norepinephrine infusion. PCCM was asked to evaluate for ongoing care.   Pertinent  Medical History   has a past medical history of Asthma, Chronic respiratory failure (HCC), Hyperlipidemia, Hypertension, Obese, OSA (obstructive sleep apnea), and Venous stasis.   Significant Hospital Events: Including procedures, antibiotic start and stop dates in addition to other pertinent events   5/1 concern for evolving sepsis in the setting of proctocolitis 5/2 progressive hypotension prompting insertion of A-line, revealed stable hemodynamics.  Appears hypercapnic on exam, BiPAP ordered  Interim History / Subjective:  Arousable but sleepy with snoring respirations  Objective   Blood pressure (!) 74/29, pulse 81, temperature 98.3 F (36.8 C), temperature source Oral, resp. rate 13, weight (!) 158.7 kg, SpO2 95 %.        Intake/Output Summary (Last 24 hours) at 08/23/2022 1016 Last data filed at 08/23/2022 0800 Gross per 24  hour  Intake 303.85 ml  Output 725 ml  Net -421.15 ml   Filed Weights   08/23/22 0500  Weight: (!) 158.7 kg    Examination: General: Acute on chronic ill-appearing elderly obese male lying in bed in no acute distress HEENT: [place]/AT, MM pink/moist, PERRL,  Neuro: Lethargic but will arouse to verbal stimuli, quickly falls back to sleep, able to follow commands CV: s1s2 regular rate and rhythm, no murmur, rubs, or gallops,  PULM: Diminished bilaterally, snoring respirations, no increased work of breathing, on 2 L nasal cannula GI: soft, bowel sounds active in all 4 quadrants, non-tender, non-distended, Extremities: warm/dry, no edema  Skin: no rashes or lesions   Resolved Hospital Problem list     Assessment & Plan:   Acute metabolic encephalopathy  -Secondary to hypercarbia. Known OSA, now with renal failure and received morphine. Did respond to Narcan, but remains somnolent.  -Never needed Narcan infusion P: Minimize sedation BiPAP during rest and at night Repeat chest x-ray/ABG as needed Maintain neuro protective measures Nutrition and bowel regiment  Aspirations precautions   Acute hypercarbic respiratory failure Chronic hypoxemic respiratory failure -Utilizes 2 L nasal cannula at baseline, unsure if patient is compliant and/or has CPAP ordered.  When mentation improves will discuss with patient P: Continue supplemental oxygen at baseline 2 L BiPAP at rest and at at bedtime Encourage pulmonary hygiene Avoid sedation  Septic shock secondary to proctocolitis.  -S/p 2.5 L IVF. End-organ damage, renal P: Continue vancomycin and Zosyn Some concern for frequent liquid stools being reported but no stools as of this a.m.  Will hold on collecting C. difficile panel until patient produces  stool Pressors for MAP goal greater than 65 Follow cultures  GIB:  -Sounds like BRBPR, but no clear history on this. P: Continue twice daily Protonix No acute indications for GI  consult currently Holding aspirin Home Celebrex discontinued Follow for further signs of bleeding Maintain large-bore IV  AKI on CKD Hyperkalemia  -Likely pre-renal azotemia. No signs of obstruction on CT.  P: Pressor support as above  Follow renal function Monitor intake and output Trend Bement Celebrex discontinued as above  Chronic HFpEF -Echo June 22, 2020 with a EF of 65 to 70% and normal RV function Hypertension P: Hold antihypertensives remain on hold Pressor support as above Hold home diuretics Resume statin when appropriate  Best Practice (right click and "Reselect all SmartList Selections" daily)   Diet/type: NPO DVT prophylaxis: prophylactic heparin  GI prophylaxis: PPI Lines: N/A Foley:  N/A Code Status:  full code Last date of multidisciplinary goals of care discussion [ ]  Unable to contact Pam who is listed as his emergency contact.   Critical care time:   CRITICAL CARE Performed by: Cristy Colmenares D. Harris  Total critical care time: 38 minutes  Critical care time was exclusive of separately billable procedures and treating other patients.  Critical care was necessary to treat or prevent imminent or life-threatening deterioration.  Critical care was time spent personally by me on the following activities: development of treatment plan with patient and/or surrogate as well as nursing, discussions with consultants, evaluation of patient's response to treatment, examination of patient, obtaining history from patient or surrogate, ordering and performing treatments and interventions, ordering and review of laboratory studies, ordering and review of radiographic studies, pulse oximetry and re-evaluation of patient's condition.  Marion Seese D. Harris, NP-C [facility] Personal contact information can be found on Amion  If no contact or response made please call [phone] 08/23/2022, 10:26 AM

## 2022-08-23 NOTE — Procedures (Signed)
Arterial Catheter Insertion Procedure Note  Brian Silva  [id number]  Aug 26, 1940  Date:08/23/22  Time:10:34 AM    Provider Performing: Alphonzo Lemmings D. Harris    Procedure: Insertion of Arterial Line (21308) with US guidance ([id number])   Indication(s) Blood pressure monitoring and/or need for frequent ABGs  Consent Unable to obtain consent due to emergent nature of procedure.  Anesthesia None   Time Out Verified patient identification, verified procedure, site/side was marked, verified correct patient position, special equipment/implants available, medications/allergies/relevant history reviewed, required imaging and test results available.   Sterile Technique Maximal sterile technique including full sterile barrier drape, hand hygiene, sterile gown, sterile gloves, mask, hair covering, sterile ultrasound probe cover (if used).   Procedure Description Area of catheter insertion was cleaned with chlorhexidine and draped in sterile fashion. With real-time ultrasound guidance an arterial catheter was placed into the right radial artery.  Appropriate arterial tracings confirmed on monitor.     Complications/Tolerance None; patient tolerated the procedure well.   EBL Minimal   Specimen(s) None  Aysen Shieh D. Harris, NP-C [facility] Personal contact information can be found on Amion  If no contact or response made please call [phone] 08/23/2022, 10:34 AM

## 2022-08-23 NOTE — Progress Notes (Signed)
PHARMACY - PHYSICIAN COMMUNICATION CRITICAL VALUE ALERT - BLOOD CULTURE IDENTIFICATION (BCID)  Brian Silva is an 82 y.o. male who presented to [facility] on 08/22/2022 with a chief complaint of rectal bleeding  Assessment:  82 yo with sepsis in the setting of proctocolitis, receiving broad spectrum antibiotics. 1 of 4 bottles growing staph species (possible contaminant).  Name of physician (or Provider) Contacted: Dr. Thora Lance  Current antibiotics: vancomycin and zosyn for sepsis due to colitis  Changes to prescribed antibiotics recommended:  Patient is on recommended antibiotics - No changes needed  Results for orders placed or performed during the hospital encounter of 08/22/22  Blood Culture ID Panel (Reflexed) (Collected: 08/22/2022  6:09 PM)  Result Value Ref Range   Enterococcus faecalis NOT DETECTED NOT DETECTED   Enterococcus Faecium NOT DETECTED NOT DETECTED   Listeria monocytogenes NOT DETECTED NOT DETECTED   Staphylococcus species DETECTED (A) NOT DETECTED   Staphylococcus aureus (BCID) NOT DETECTED NOT DETECTED   Staphylococcus epidermidis NOT DETECTED NOT DETECTED   Staphylococcus lugdunensis NOT DETECTED NOT DETECTED   Streptococcus species NOT DETECTED NOT DETECTED   Streptococcus agalactiae NOT DETECTED NOT DETECTED   Streptococcus pneumoniae NOT DETECTED NOT DETECTED   Streptococcus pyogenes NOT DETECTED NOT DETECTED   A.calcoaceticus-baumannii NOT DETECTED NOT DETECTED   Bacteroides fragilis NOT DETECTED NOT DETECTED   Enterobacterales NOT DETECTED NOT DETECTED   Enterobacter cloacae complex NOT DETECTED NOT DETECTED   Escherichia coli NOT DETECTED NOT DETECTED   Klebsiella aerogenes NOT DETECTED NOT DETECTED   Klebsiella oxytoca NOT DETECTED NOT DETECTED   Klebsiella pneumoniae NOT DETECTED NOT DETECTED   Proteus species NOT DETECTED NOT DETECTED   Salmonella species NOT DETECTED NOT DETECTED   Serratia marcescens NOT DETECTED NOT DETECTED   Haemophilus  influenzae NOT DETECTED NOT DETECTED   Neisseria meningitidis NOT DETECTED NOT DETECTED   Pseudomonas aeruginosa NOT DETECTED NOT DETECTED   Stenotrophomonas maltophilia NOT DETECTED NOT DETECTED   Candida albicans NOT DETECTED NOT DETECTED   Candida auris NOT DETECTED NOT DETECTED   Candida glabrata NOT DETECTED NOT DETECTED   Candida krusei NOT DETECTED NOT DETECTED   Candida parapsilosis NOT DETECTED NOT DETECTED   Candida tropicalis NOT DETECTED NOT DETECTED   Cryptococcus neoformans/gattii NOT DETECTED NOT DETECTED    Loralee Pacas, PharmD, BCPS Please see amion for complete clinical pharmacist phone list 08/23/2022  6:52 PM

## 2022-08-24 ENCOUNTER — Inpatient Hospital Stay (HOSPITAL_COMMUNITY): Payer: Medicare Other

## 2022-08-24 DIAGNOSIS — M7989 Other specified soft tissue disorders: Secondary | ICD-10-CM | POA: Diagnosis not present

## 2022-08-24 DIAGNOSIS — G9341 Metabolic encephalopathy: Secondary | ICD-10-CM

## 2022-08-24 DIAGNOSIS — J9602 Acute respiratory failure with hypercapnia: Secondary | ICD-10-CM

## 2022-08-24 DIAGNOSIS — J9621 Acute and chronic respiratory failure with hypoxia: Secondary | ICD-10-CM

## 2022-08-24 DIAGNOSIS — J9611 Chronic respiratory failure with hypoxia: Secondary | ICD-10-CM

## 2022-08-24 DIAGNOSIS — R578 Other shock: Secondary | ICD-10-CM

## 2022-08-24 LAB — CBC
HCT: 35.8 % — ABNORMAL LOW (ref 39.0–52.0)
Hemoglobin: 11 g/dL — ABNORMAL LOW (ref 13.0–17.0)
MCH: 28.2 pg (ref 26.0–34.0)
MCHC: 30.7 g/dL (ref 30.0–36.0)
MCV: 91.8 fL (ref 80.0–100.0)
Platelets: UNDETERMINED 10*3/uL (ref 150–400)
RBC: 3.9 MIL/uL — ABNORMAL LOW (ref 4.22–5.81)
RDW: 14.4 % (ref 11.5–15.5)
WBC: 11.1 10*3/uL — ABNORMAL HIGH (ref 4.0–10.5)
nRBC: 0 % (ref 0.0–0.2)

## 2022-08-24 LAB — COMPREHENSIVE METABOLIC PANEL
ALT: 18 U/L (ref 0–44)
AST: 20 U/L (ref 15–41)
Albumin: 2.7 g/dL — ABNORMAL LOW (ref 3.5–5.0)
Alkaline Phosphatase: 58 U/L (ref 38–126)
Anion gap: 10 (ref 5–15)
BUN: 75 mg/dL — ABNORMAL HIGH (ref 8–23)
CO2: 22 mmol/L (ref 22–32)
Calcium: 8.7 mg/dL — ABNORMAL LOW (ref 8.9–10.3)
Chloride: 103 mmol/L (ref 98–111)
Creatinine, Ser: 3.05 mg/dL — ABNORMAL HIGH (ref 0.61–1.24)
GFR, Estimated: 20 mL/min — ABNORMAL LOW (ref >60)
Glucose, Bld: 101 mg/dL — ABNORMAL HIGH (ref 70–99)
Potassium: 5.5 mmol/L — ABNORMAL HIGH (ref 3.5–5.1)
Sodium: 135 mmol/L (ref 135–145)
Total Bilirubin: 0.6 mg/dL (ref 0.3–1.2)
Total Protein: 6.7 g/dL (ref 6.5–8.1)

## 2022-08-24 LAB — ECHOCARDIOGRAM COMPLETE
AR max vel: 2.56 cm2
AV Peak grad: 11.4 mmHg
Ao pk vel: 1.69 m/s
Area-P 1/2: 3.91 cm2
Height: 72 in
S' Lateral: 2.7 cm
Weight: 5619.08 oz

## 2022-08-24 LAB — VANCOMYCIN, RANDOM: Vancomycin Rm: 13 ug/mL

## 2022-08-24 LAB — GLUCOSE, CAPILLARY
Glucose-Capillary: 136 mg/dL — ABNORMAL HIGH (ref 70–99)
Glucose-Capillary: 157 mg/dL — ABNORMAL HIGH (ref 70–99)
Glucose-Capillary: 86 mg/dL (ref 70–99)
Glucose-Capillary: 92 mg/dL (ref 70–99)
Glucose-Capillary: 92 mg/dL (ref 70–99)
Glucose-Capillary: 97 mg/dL (ref 70–99)

## 2022-08-24 LAB — CULTURE, BLOOD (ROUTINE X 2)

## 2022-08-24 MED ORDER — DOXYCYCLINE HYCLATE 100 MG PO TABS: 100.0000 mg | ORAL_TABLET | Freq: Two times a day (BID) | ORAL | Status: DC

## 2022-08-24 MED ORDER — VANCOMYCIN HCL IN DEXTROSE 1-5 GM/200ML-% IV SOLN
1000.0000 mg | Freq: Once | INTRAVENOUS | Status: AC
  Administered 2022-08-24: 1000 mg via INTRAVENOUS
  Filled 2022-08-24: qty 200

## 2022-08-24 MED ORDER — PIPERACILLIN-TAZOBACTAM 3.375 G IVPB
3.3750 g | Freq: Three times a day (TID) | INTRAVENOUS | Status: DC
  Administered 2022-08-24: 3.375 g via INTRAVENOUS
  Filled 2022-08-24: qty 50

## 2022-08-24 MED ORDER — PERFLUTREN LIPID MICROSPHERE
1.0000 mL | INTRAVENOUS | Status: AC | PRN
  Administered 2022-08-24: 6 mL via INTRAVENOUS

## 2022-08-24 MED ORDER — SODIUM ZIRCONIUM CYCLOSILICATE 5 G PO PACK
5.0000 g | PACK | Freq: Once | ORAL | Status: AC
  Administered 2022-08-24: 5 g via ORAL
  Filled 2022-08-24: qty 1

## 2022-08-24 MED ORDER — ACETAMINOPHEN 325 MG PO TABS
650.0000 mg | ORAL_TABLET | Freq: Four times a day (QID) | ORAL | Status: DC | PRN
  Administered 2022-08-24 – 2022-08-30 (×5): 650 mg via ORAL
  Filled 2022-08-24 (×5): qty 2

## 2022-08-24 NOTE — Progress Notes (Signed)
Bilateral lower extremity venous study completed.   Preliminary results relayed to RN.  Please see CV Procedures for preliminary results.  Anaily Ashbaugh, RVT  3:14 PM 08/24/22

## 2022-08-24 NOTE — Progress Notes (Signed)
eLink Physician-Brief Progress Note Patient Name: Brian Silva DOB: 1940-07-27 MRN: [id number]   Date of Service  08/24/2022  HPI/Events of Note  82yo male that presented with encephalopathy secondary to hypercarbia, septic shock from protocolitis and GIB.  New back pain, requesting tylenol  eICU Interventions  Add PRN tylenol     Intervention Category Minor Interventions: Routine modifications to care plan (e.g. PRN medications for pain, fever)  Marlia Schewe 08/24/2022, 1:11 AM

## 2022-08-24 NOTE — Progress Notes (Signed)
Echocardiogram 2D Echocardiogram has been performed.  Lucendia Herrlich 08/24/2022, 2:44 PM

## 2022-08-24 NOTE — Progress Notes (Addendum)
Pharmacy Antibiotic Note  Brian Silva is a 82 y.o. male admitted on 08/22/2022 with concern for intra-abdominal infection and/or sepsis.  Pharmacy has been consulted for Vancomycin and Zosyn dosing.   SCr improving, good UOP, afebrile, WBC elevated. Vancomycin random level 13 mcg/mL (redose when < 20 mcg/mL)  Plan: Vanc 1gm IV x 1, vanc random in AM Change Zosyn to EID 3.375gm IV Q8H Monitor renal fxn, clinical progress, vanc levels PRN vs de-escalate  Height: 6' (182.9 cm) Weight: (!) 159.3 kg (351 lb 3.1 oz) IBW/kg (Calculated) : 77.6  Temp (24hrs), Avg:98.7 F (37.1 C), Min:98.3 F (36.8 C), Max:99.4 F (37.4 C)  Recent Labs  Lab 08/22/22 1708 08/22/22 1809 08/23/22 0615 08/23/22 0822 08/23/22 1554 08/23/22 2155  WBC 16.7*  --  17.4*  --   --   --   CREATININE 3.51*  --  3.33*  --  3.28* 3.09*  LATICACIDVEN  --  1.2  --  1.1  --   --      Estimated Creatinine Clearance: 30.2 mL/min (A) (by C-G formula based on SCr of 3.09 mg/dL (H)).    No Known Allergies  Vanc 5/2 >> Zosyn 5/2 >>   5/3 VR post 2gm, 30 hrs post load >> 1g x1 (SCr 3.05)   5/1 BCx - 1 of 4 Staph spp, likely contaminant 5/1 MRSA PCR - positive  Brian Silva, PharmD, BCPS, BCCCP 08/24/2022, 8:53 AM

## 2022-08-24 NOTE — TOC Progression Note (Signed)
Transition of Care [facility]) - Initial/Assessment Note    Patient Details  Name: Brian Silva MRN: [id number] Date of Birth: Feb 01, 1941  Transition of Care [facility]) CM/SW Contact:    Ralene Bathe, LCSWA Phone Number: 08/24/2022, 12:49 PM  Clinical Narrative:                 LCSW contacted [facility] ([facility]) and spoke with [facility] in admissions.  The patient is long term care at the facility and can return when medically ready.   TOC following.         Patient Goals and CMS Choice            Expected Discharge Plan and Services                                              Prior Living Arrangements/Services                       Activities of Daily Living      Permission Sought/Granted                  Emotional Assessment              Admission diagnosis:  Proctocolitis [K52.9] Rectal bleeding [K62.5] AKI (acute kidney injury) (HCC) [N17.9] Septic shock (HCC) [A41.9, R65.21] Gastrointestinal hemorrhage, unspecified gastrointestinal hemorrhage type [K92.2] Patient Active Problem List   Diagnosis Date Noted   Acute on chronic respiratory failure with hypoxia and hypercapnia (HCC) 08/24/2022   Acute metabolic encephalopathy 08/24/2022   Septic shock (HCC) 08/22/2022   Weakness 09/10/2021   Cervical radiculopathy 09/10/2021   Community acquired pneumonia 09/09/2021   Bacteremia    Acute renal failure superimposed on stage 2 chronic kidney disease (HCC) 07/06/2020   Metabolic acidosis 07/06/2020   Uremia 07/06/2020   Hyperkalemia 07/06/2020   Chronic diastolic CHF (congestive heart failure) (HCC) 07/06/2020   Pneumonia of both lower lobes due to infectious organism 12/23/2019   CAP (community acquired pneumonia) 12/22/2019   Compression fracture of body of thoracic vertebra (HCC) 09/10/2019   Fall at home, initial encounter 09/10/2019   AKI (acute kidney injury) (HCC) 01/17/2018   Syncope, vasovagal 01/17/2018   Diabetes  mellitus type II, non insulin dependent (HCC) 01/17/2018   Hyponatremia 01/17/2018   Syncope 01/17/2018   Depression 10/25/2015   Hyperlipidemia 10/25/2015   TINEA PEDIS 01/04/2010   TINEA VERSICOLOR 01/04/2010   URI 01/04/2010   HYPERGLYCEMIA 12/02/2008   ABDOMINAL WALL HERNIA 12/01/2007   OBESITY, MORBID 04/04/2007   SLEEP APNEA, OBSTRUCTIVE 04/04/2007   LOW BACK PAIN, CHRONIC 12/07/2004   HYPERLIPIDEMIA 08/12/2002   ALLERGIC RHINITIS, SEASONAL 08/12/2002   Essential hypertension 04/01/2000   Asthma 10/11/1997   SUPERFICIAL PHLEBITIS 05/06/1990   PCP:  Merrilyn Puma, DO Pharmacy:   [id number] [address] - [place], [place] - [address] [place] [address] Phone: [phone] Fax: [phone]  [id number] [place], [place] - [address] AT [facility] [address] [facility] [address] [place] [address] Phone: [phone] Fax: [phone]     Social Determinants of Health (SDOH) Social History: SDOH Screenings   Food Insecurity: Unknown (01/29/2019)  Transportation Needs: Unknown (01/29/2019)  Financial Resource Strain: Low Risk  (01/29/2019)  Physical Activity: Unknown (01/29/2019)  Social Connections: Unknown (01/29/2019)  Stress: No Stress Concern Present (01/29/2019)  Tobacco Use: Low Risk  (10/22/2021)   SDOH Interventions:     Readmission Risk Interventions     No data to display

## 2022-08-24 NOTE — Progress Notes (Signed)
NAME:  Brian Silva, MRN:  [id number], DOB:  07-Sep-1940, LOS: 2 ADMISSION DATE:  08/22/2022, CONSULTATION DATE:  5/1 REFERRING MD:  Dr. Elpidio Anis, CHIEF COMPLAINT:  hypotension   History of Present Illness:  82 year old male with PMH as below, which is significant for, chronic respiratory failure on 2L, OSA not on CPAP, chronic incarcerated umbilical hernia, wheelchair bound, and HTN. He resides in [facility]. Presented to [facility] ED via EMS 5/1 with complaints of rectal bleeding x 2 episodes. Also had one episode of non-bloody emesis the night prior and abdominal pain for approximately 4 days. Upon arrival to the emergency department he had stable vital signs and underwent workup for GIB and abd pain. Laboratory evaluation was significant for K 5.5, Creatinine 3.51, BUN 91, WBC 16.7, and hemoglobin 10.4. CT of the abdomen and pelvis was remarkable for proctocolitis. Empiric antibiotics were initiated. He did become hypotensive despite 2L IVF resuscitation requiring low dose norepinephrine infusion. PCCM was asked to evaluate for ongoing care.   Pertinent  Medical History   has a past medical history of Asthma, Chronic respiratory failure (HCC), Hyperlipidemia, Hypertension, Obese, OSA (obstructive sleep apnea), and Venous stasis.   Significant Hospital Events: Including procedures, antibiotic start and stop dates in addition to other pertinent events   5/1 concern for evolving sepsis in the setting of proctocolitis 5/2 progressive hypotension prompting insertion of A-line, revealed stable hemodynamics.  Appears hypercapnic on exam, BiPAP ordered 5/3 weaning pressors and adv diet to clears   Interim History / Subjective:  Back on pressors   Objective   Blood pressure (!) 88/65, pulse 82, temperature 99.2 F (37.3 C), temperature source Oral, resp. rate 18, height 6' (1.829 m), weight (!) 159.3 kg, SpO2 99 %.        Intake/Output Summary (Last 24 hours) at 08/24/2022 0947 Last data  filed at 08/24/2022 0700 Gross per 24 hour  Intake 1223.27 ml  Output 1650 ml  Net -426.73 ml   Filed Weights   08/23/22 0500 08/24/22 0500  Weight: (!) 158.7 kg (!) 159.3 kg    Examination: General: Chronically and acutely ill obese older adult M NAD  Neuro: Awake alert oriented x 2-3 following commands  HEENT:  NCAT pink mm anicteric sclera  CV: rr cap refill < 3sec  PULM: Diminished sounds. Symmetrical chest expansion without accessory use  GI: soft obese nontender  Extremities: no acute joint deformity  Skin: pale c/d/w    Resolved Hospital Problem list     Assessment & Plan:   Acute metabolic encephalopathy  -hypercarbia, renal failure, medication SE  P: -delirium precautions  -NPPV compliance   Acute on chronic hypoxic and hypercarbic resp failure OSA  -baseline 2L P: -PRN and qHS CPAP  -IS, pulm hygiene   Septic shock 2/2 proctocolitis vs possible bacteremia (favor contaminant) -with end organ damage  P:  -GPC in 1 of 4 bcx which is likely contaminant however w + MRSA PCR will cont vanc awaiting speciation  -for proctocolitis, looks like rec is 1x rocephin then 7d doxy -- will change abx accordingly  -wean periph NE for MAP > 65  Chronic HFpEF Hypertension P: -holding antihypertensives w shock  -no clear indication for repeat echo at this point  AKI on CKD Hyperkalemia  P: -follow renal indices, UOP -minimize nephrotoxic meds  -will give 1x lokelma   Possible GIB -there was report of BRBPR but difficult to corroborate. Not ongoing at this point.  P: -cont PPI  BID -PRN CBC  -defer GI consult for now  Chronic pain P: -holding home meds w encephalopathy    Best Practice (right click and "Reselect all SmartList Selections" daily)   Diet/type: NPO DVT prophylaxis: prophylactic heparin  GI prophylaxis: PPI Lines: N/A Foley:  N/A Code Status:  full code Last date of multidisciplinary goals of care discussion [ ]  Unable to contact Pam who  is listed as his emergency contact.   Critical care time:   CRITICAL CARE Performed by: Lanier Clam  Total critical care time: 38 minutes  Critical care time was exclusive of separately billable procedures and treating other patients. Critical care was necessary to treat or prevent imminent or life-threatening deterioration.  Critical care was time spent personally by me on the following activities: development of treatment plan with patient and/or surrogate as well as nursing, discussions with consultants, evaluation of patient's response to treatment, examination of patient, obtaining history from patient or surrogate, ordering and performing treatments and interventions, ordering and review of laboratory studies, ordering and review of radiographic studies, pulse oximetry and re-evaluation of patient's condition.  Tessie Fass MSN, AGACNP-BC [facility] Pulmonary/Critical Care Medicine Amion for pager  08/24/2022, 9:47 AM

## 2022-08-25 DIAGNOSIS — K529 Noninfective gastroenteritis and colitis, unspecified: Secondary | ICD-10-CM

## 2022-08-25 DIAGNOSIS — G9340 Encephalopathy, unspecified: Secondary | ICD-10-CM

## 2022-08-25 DIAGNOSIS — G9341 Metabolic encephalopathy: Secondary | ICD-10-CM

## 2022-08-25 DIAGNOSIS — K922 Gastrointestinal hemorrhage, unspecified: Secondary | ICD-10-CM

## 2022-08-25 LAB — COMPREHENSIVE METABOLIC PANEL
ALT: 15 U/L (ref 0–44)
AST: 16 U/L (ref 15–41)
Albumin: 2.5 g/dL — ABNORMAL LOW (ref 3.5–5.0)
Alkaline Phosphatase: 56 U/L (ref 38–126)
Anion gap: 8 (ref 5–15)
BUN: 65 mg/dL — ABNORMAL HIGH (ref 8–23)
CO2: 24 mmol/L (ref 22–32)
Calcium: 8.5 mg/dL — ABNORMAL LOW (ref 8.9–10.3)
Chloride: 104 mmol/L (ref 98–111)
Creatinine, Ser: 2.44 mg/dL — ABNORMAL HIGH (ref 0.61–1.24)
GFR, Estimated: 26 mL/min — ABNORMAL LOW (ref >60)
Glucose, Bld: 101 mg/dL — ABNORMAL HIGH (ref 70–99)
Potassium: 4.4 mmol/L (ref 3.5–5.1)
Sodium: 136 mmol/L (ref 135–145)
Total Bilirubin: 0.8 mg/dL (ref 0.3–1.2)
Total Protein: 6 g/dL — ABNORMAL LOW (ref 6.5–8.1)

## 2022-08-25 LAB — GLUCOSE, CAPILLARY
Glucose-Capillary: 87 mg/dL (ref 70–99)
Glucose-Capillary: 94 mg/dL (ref 70–99)
Glucose-Capillary: 103 mg/dL — ABNORMAL HIGH (ref 70–99)
Glucose-Capillary: 99 mg/dL (ref 70–99)
Glucose-Capillary: 129 mg/dL — ABNORMAL HIGH (ref 70–99)

## 2022-08-25 LAB — CBC
HCT: 28.5 % — ABNORMAL LOW (ref 39.0–52.0)
Hemoglobin: 8.8 g/dL — ABNORMAL LOW (ref 13.0–17.0)
MCH: 28.2 pg (ref 26.0–34.0)
MCHC: 30.9 g/dL (ref 30.0–36.0)
MCV: 91.3 fL (ref 80.0–100.0)
Platelets: 206 10*3/uL (ref 150–400)
RBC: 3.12 MIL/uL — ABNORMAL LOW (ref 4.22–5.81)
RDW: 13.9 % (ref 11.5–15.5)
WBC: 7.8 10*3/uL (ref 4.0–10.5)
nRBC: 0 % (ref 0.0–0.2)

## 2022-08-25 LAB — CULTURE, BLOOD (ROUTINE X 2): Culture: NO GROWTH

## 2022-08-25 MED ORDER — DOXYCYCLINE HYCLATE 100 MG PO TABS
100.0000 mg | ORAL_TABLET | Freq: Two times a day (BID) | ORAL | Status: DC
  Administered 2022-08-25: 100 mg via ORAL
  Filled 2022-08-25: qty 1

## 2022-08-25 MED ORDER — AMOXICILLIN-POT CLAVULANATE 500-125 MG PO TABS
1.0000 | ORAL_TABLET | Freq: Three times a day (TID) | ORAL | Status: DC
  Administered 2022-08-25 – 2022-08-30 (×16): 1 via ORAL
  Filled 2022-08-25 (×22): qty 1

## 2022-08-25 MED ORDER — HYDROXYZINE HCL 25 MG PO TABS
25.0000 mg | ORAL_TABLET | Freq: Once | ORAL | Status: AC
  Administered 2022-08-25: 25 mg via ORAL
  Filled 2022-08-25: qty 1

## 2022-08-25 MED ORDER — INSULIN ASPART 100 UNIT/ML IJ SOLN
0.0000 [IU] | Freq: Three times a day (TID) | INTRAMUSCULAR | Status: DC
  Administered 2022-08-26 (×2): 1 [IU] via SUBCUTANEOUS
  Administered 2022-08-26: 2 [IU] via SUBCUTANEOUS
  Administered 2022-08-27 (×2): 1 [IU] via SUBCUTANEOUS

## 2022-08-25 MED ORDER — PANTOPRAZOLE SODIUM 40 MG PO TBEC
40.0000 mg | DELAYED_RELEASE_TABLET | Freq: Two times a day (BID) | ORAL | Status: DC
  Administered 2022-08-25 – 2022-08-29 (×8): 40 mg via ORAL
  Filled 2022-08-25 (×8): qty 1

## 2022-08-25 NOTE — Progress Notes (Signed)
eLink Physician-Brief Progress Note Patient Name: TIJUAN SAGEL DOB: 1941/01/22 MRN: [id number]   Date of Service  08/25/2022  HPI/Events of Note  feeling restless, unable to sleep  eICU Interventions  Hydroxyzine x1     Intervention Category Minor Interventions: Routine modifications to care plan (e.g. PRN medications for pain, fever)  Shatonya Passon 08/25/2022, 3:58 AM

## 2022-08-25 NOTE — Progress Notes (Signed)
Patient transferred to 5 west room 16 all belonging place at bedside.

## 2022-08-25 NOTE — Progress Notes (Signed)
NAME:  DONTAVION EVEN, MRN:  [id number], DOB:  1940-05-11, LOS: 3 ADMISSION DATE:  08/22/2022, CONSULTATION DATE:  5/1 REFERRING MD:  Dr. Elpidio Anis, CHIEF COMPLAINT:  hypotension   History of Present Illness:  82 year old male with PMH as below, which is significant for, chronic respiratory failure on 2L, OSA not on CPAP, chronic incarcerated umbilical hernia, wheelchair bound, and HTN. He resides in [facility]. Presented to [facility] ED via EMS 5/1 with complaints of rectal bleeding x 2 episodes. Also had one episode of non-bloody emesis the night prior and abdominal pain for approximately 4 days. Upon arrival to the emergency department he had stable vital signs and underwent workup for GIB and abd pain. Laboratory evaluation was significant for K 5.5, Creatinine 3.51, BUN 91, WBC 16.7, and hemoglobin 10.4. CT of the abdomen and pelvis was remarkable for proctocolitis. Empiric antibiotics were initiated. He did become hypotensive despite 2L IVF resuscitation requiring low dose norepinephrine infusion. PCCM was asked to evaluate for ongoing care.   Pertinent  Medical History   has a past medical history of Asthma, Chronic respiratory failure (HCC), Hyperlipidemia, Hypertension, Obese, OSA (obstructive sleep apnea), and Venous stasis.   Significant Hospital Events: Including procedures, antibiotic start and stop dates in addition to other pertinent events   5/1 concern for evolving sepsis in the setting of proctocolitis 5/2 progressive hypotension prompting insertion of A-line, revealed stable hemodynamics.  Appears hypercapnic on exam, BiPAP ordered 5/3 weaning pressors and adv diet to clears  5/4 off levo, transfer out   Interim History / Subjective:   Off NE Improving Cr  2.3L UOP yesterday   Does not want lights on this morning, wants to know if he can go home   Objective   Blood pressure (!) 110/55, pulse 82, temperature 99.8 F (37.7 C), temperature source Oral, resp. rate (!)  24, height 6' (1.829 m), weight (!) 158.7 kg, SpO2 94 %.        Intake/Output Summary (Last 24 hours) at 08/25/2022 0717 Last data filed at 08/25/2022 0600 Gross per 24 hour  Intake 1951.18 ml  Output 2300 ml  Net -348.82 ml   Filed Weights   08/23/22 0500 08/24/22 0500 08/25/22 0500  Weight: (!) 158.7 kg (!) 159.3 kg (!) 158.7 kg    Examination: General: Chronically ill obese older adult M  Neuro:  AAOx3 following commands but intermittently confused  HEENT:  NCAT pink mm anicteric sclera CV: rr cap refill < 3 sec  PULM: Symmetrical chest expansion, even unlabored, diminished breath sounds  GI: obese soft ndnt  Extremities: no acute deformity. BLE edema  Skin: pale c/d/w, focal ecchymosis at lab draw sites    Resolved Hospital Problem list     Assessment & Plan:    Acute metabolic encephalopathy, improving  -hypercarbia, renal failure, medication SE  P: -delirium precautions  -NPPV compliance   AoC hypoxic and hypercarbic resp failure OSA  -baseline 2L P: -PRN and qHS CPAP  -IS, pulm hygiene, mobility   Septic shock 2/2 proctocolitis  -with end organ damage  -1 of 4 bcx staph haemolyticus, contaminant  P: -7d doxy course -MAP > 65, off NE   Chronic HFpEF Hx HTN  P: -holding antihypertensives, recent shock. Add back as appropriate   AKI on CKD II -Cr, BUN down-trending.  P: -follow renal indices, UOP -minimize nephrotoxic meds   Possible GIB  -there was report of BRBPR but difficult to corroborate. Not ongoing at this point.  P: -cont PPI BID -PRN CBC  -defer GI consult for now  Anemia -- acute on chronic -iatrogenic lab draws, possible GIB P: -PRN CBC -outpt Fe studies   Chronic pain P: -holding home meds w encephalopathy   Physical debility, deconditioning P: -PT/OT    Best Practice (right click and "Reselect all SmartList Selections" daily)   Diet/type: Regular consistency (see orders) - -adv as tolerated  DVT prophylaxis:  prophylactic heparin  GI prophylaxis: PPI Lines: N/A Foley:  N/A Code Status:  full code Last date of multidisciplinary goals of care discussion [ ]  Unable to contact Pam who is listed as his emergency contact.  Dispo: stable to transfer out of ICU 5/4, will ask [facility] to take over 5/5. Anticipate a few more inpt days to allow renal fxn and encephalopathy to normalize      CCT: n/a   Tessie Fass MSN, AGACNP-BC [facility] Pulmonary/Critical Care Medicine Amion for pager  08/25/2022, 9:28 AM

## 2022-08-26 ENCOUNTER — Inpatient Hospital Stay (HOSPITAL_COMMUNITY): Payer: Medicare Other

## 2022-08-26 DIAGNOSIS — R195 Other fecal abnormalities: Secondary | ICD-10-CM | POA: Diagnosis not present

## 2022-08-26 DIAGNOSIS — E1129 Type 2 diabetes mellitus with other diabetic kidney complication: Secondary | ICD-10-CM

## 2022-08-26 DIAGNOSIS — K5229 Other allergic and dietetic gastroenteritis and colitis: Secondary | ICD-10-CM

## 2022-08-26 DIAGNOSIS — D62 Acute posthemorrhagic anemia: Secondary | ICD-10-CM | POA: Diagnosis not present

## 2022-08-26 DIAGNOSIS — A419 Sepsis, unspecified organism: Secondary | ICD-10-CM

## 2022-08-26 DIAGNOSIS — I10 Essential (primary) hypertension: Secondary | ICD-10-CM

## 2022-08-26 DIAGNOSIS — R6521 Severe sepsis with septic shock: Secondary | ICD-10-CM | POA: Diagnosis not present

## 2022-08-26 DIAGNOSIS — N189 Chronic kidney disease, unspecified: Secondary | ICD-10-CM

## 2022-08-26 DIAGNOSIS — J9690 Respiratory failure, unspecified, unspecified whether with hypoxia or hypercapnia: Secondary | ICD-10-CM

## 2022-08-26 DIAGNOSIS — Z6841 Body Mass Index (BMI) 40.0 and over, adult: Secondary | ICD-10-CM

## 2022-08-26 LAB — BLOOD GAS, VENOUS
Acid-Base Excess: 4.2 mmol/L — ABNORMAL HIGH (ref 0.0–2.0)
Bicarbonate: 28.4 mmol/L — ABNORMAL HIGH (ref 20.0–28.0)
Drawn by: 59174
O2 Saturation: 61.9 %
Patient temperature: 37.2
pCO2, Ven: 40 mmHg — ABNORMAL LOW (ref 44–60)
pH, Ven: 7.46 — ABNORMAL HIGH (ref 7.25–7.43)
pO2, Ven: 33 mmHg (ref 32–45)

## 2022-08-26 LAB — MAGNESIUM: Magnesium: 2 mg/dL (ref 1.7–2.4)

## 2022-08-26 LAB — CBC
HCT: 29.1 % — ABNORMAL LOW (ref 39.0–52.0)
Hemoglobin: 9.4 g/dL — ABNORMAL LOW (ref 13.0–17.0)
MCH: 28.8 pg (ref 26.0–34.0)
MCHC: 32.3 g/dL (ref 30.0–36.0)
MCV: 89.3 fL (ref 80.0–100.0)
Platelets: 283 10*3/uL (ref 150–400)
RBC: 3.26 MIL/uL — ABNORMAL LOW (ref 4.22–5.81)
RDW: 13.4 % (ref 11.5–15.5)
WBC: 9.3 10*3/uL (ref 4.0–10.5)
nRBC: 0 % (ref 0.0–0.2)

## 2022-08-26 LAB — BASIC METABOLIC PANEL
Anion gap: 13 (ref 5–15)
BUN: 34 mg/dL — ABNORMAL HIGH (ref 8–23)
CO2: 23 mmol/L (ref 22–32)
Calcium: 9.2 mg/dL (ref 8.9–10.3)
Chloride: 104 mmol/L (ref 98–111)
Creatinine, Ser: 1.64 mg/dL — ABNORMAL HIGH (ref 0.61–1.24)
GFR, Estimated: 42 mL/min — ABNORMAL LOW (ref >60)
Glucose, Bld: 127 mg/dL — ABNORMAL HIGH (ref 70–99)
Potassium: 3.8 mmol/L (ref 3.5–5.1)
Sodium: 140 mmol/L (ref 135–145)

## 2022-08-26 LAB — GLUCOSE, CAPILLARY
Glucose-Capillary: 156 mg/dL — ABNORMAL HIGH (ref 70–99)
Glucose-Capillary: 125 mg/dL — ABNORMAL HIGH (ref 70–99)
Glucose-Capillary: 131 mg/dL — ABNORMAL HIGH (ref 70–99)
Glucose-Capillary: 112 mg/dL — ABNORMAL HIGH (ref 70–99)

## 2022-08-26 LAB — BRAIN NATRIURETIC PEPTIDE: B Natriuretic Peptide: 345.3 pg/mL — ABNORMAL HIGH (ref 0.0–100.0)

## 2022-08-26 LAB — AMMONIA: Ammonia: 46 umol/L — ABNORMAL HIGH (ref 9–35)

## 2022-08-26 LAB — PROTIME-INR
INR: 1.2 (ref 0.8–1.2)
Prothrombin Time: 14.7 seconds (ref 11.4–15.2)

## 2022-08-26 LAB — C-REACTIVE PROTEIN: CRP: 13.7 mg/dL — ABNORMAL HIGH (ref <1.0)

## 2022-08-26 LAB — PROCALCITONIN: Procalcitonin: 0.17 ng/mL

## 2022-08-26 MED ORDER — CARVEDILOL 3.125 MG PO TABS
3.1250 mg | ORAL_TABLET | Freq: Two times a day (BID) | ORAL | Status: DC
  Administered 2022-08-26 – 2022-08-30 (×9): 3.125 mg via ORAL
  Filled 2022-08-26 (×10): qty 1

## 2022-08-26 MED ORDER — QUETIAPINE FUMARATE 25 MG PO TABS
25.0000 mg | ORAL_TABLET | Freq: Every day | ORAL | Status: DC
  Administered 2022-08-26 – 2022-08-29 (×4): 25 mg via ORAL
  Filled 2022-08-26 (×4): qty 1

## 2022-08-26 MED ORDER — IPRATROPIUM-ALBUTEROL 0.5-2.5 (3) MG/3ML IN SOLN
3.0000 mL | RESPIRATORY_TRACT | Status: DC | PRN
  Administered 2022-08-26 – 2022-08-30 (×10): 3 mL via RESPIRATORY_TRACT
  Filled 2022-08-26 (×9): qty 3

## 2022-08-26 NOTE — Progress Notes (Signed)
RT note. Patient not requiring bipap at this time. Patient on rm air with no labored breathing noted. V60 in patients room if needed later. RT will continue to monitor.    08/26/22 1140  Therapy Vitals  Pulse Rate 78  Resp 20  MEWS Score/Color  MEWS Score 0  MEWS Score Color Green  Oxygen Therapy/Pulse Ox  O2 Device Room Air  SpO2 98 %

## 2022-08-26 NOTE — Progress Notes (Signed)
Dawn, RN to follow up with hospitalist about foley.

## 2022-08-26 NOTE — Plan of Care (Signed)

## 2022-08-26 NOTE — Consult Note (Addendum)
Attending physician's note   I have taken a history, reviewed the chart, and examined the patient. I performed a substantive portion of this encounter, including complete performance of at least one of the key components, in conjunction with the APP. I agree with the APP's note, impression, and recommendations with my edits.   82 year old male with medical history as outlined below, admitted on 08/22/2022 with sepsis, hypotension requiring vasopressor support, and blood culture showing 1/4 staph hemolyticus (Zosyn initially, now on doxycycline).  Source not entirely clear, but admission CT with thickening in the distal sigmoid/rectum with concern for proctocolitis.  No other intra-abdominal pathology noted on CT.  Has been weaned off vasopressors and transferred out of the ICU.  No previous EGD or colonoscopy.  Did have reported rectal bleeding by nursing home staff PTA.   No prior personal or family history of significant GI issues.  He is wheelchair-bound and chronic respiratory failure on home O2, residing at [facility].  Admission evaluation notable for the following: - H/H 10.4/34 with MCV/RDW 93/14 on admission (baseline Hgb ~12) - H/H with subtle downtrend during admission, now 9.4/29 - FOBT positive - PCT 0.25 --> 0.17 - AKI on admission, now improving with BUN/creatinine 34/1.6 (baseline creatinine ~1.8-1.9) - INR 1.2  1) Normocytic anemia 2) Heme positive stool 3) Distal sigmoid/rectal wall thickening on CT - Will require colonoscopy at some point in time.  Not ready for that currently as he just moved out of the ICU today.  Would give this a little bit of time for healing prior to undergoing colonoscopy.  Ideally this could be done as an outpatient, but given his increased perioperative risks (home O2, OSA, obesity, wheelchair-bound) and need for the procedure to be done in the hospital setting, will likely plan to accomplish this later on this admission when he is a bit more  stable - Continue serial CBC checks - Continue antimicrobial therapy per primary service - Check iron panel, B12, folate  4) Sepsis-improving 5) Metabolic encephalopathy-resolved - Antimicrobial therapy per primary service  Dr. Rhea Belton will assume his inpatient GI care starting 08/27/2022  Doristine Locks, DO, [place] [phone] office         Consultation  Referring Provider: TR[facility]/ Thedore Mins Primary Care Physician:  Merrilyn Puma, DO Primary Gastroenterologist: None, unassigned.  Reason for Consultation: Septic shock,  acute proctocolitis  HPI: Brian Silva is a 82 y.o. male, with multiple medical problems, nursing home resident who was admitted on 08/22/2022 with left-sided abdominal pain and rectal bleeding which had been noted by the staff at his facility.  On admission he was diagnosed with septic shock, had associated acute metabolic encephalopathy which has resolved. He required pressor support, was managed with IV Zosyn.   On CT abdomen pelvis on 08/22/2022 was noted to have wall thickening of the distal sigmoid and rectum with adjacent fat stranding consistent with a proctocolitis infectious versus inflammatory. Blood cultures positive for staph hemolyticus, probably contaminant.  No stool studies were done.  He did not present with any acute diarrheal illness however. He has history of chronic respiratory failure, did require BiPAP but not intubation. He has been improving and was transferred out of the ICU today.  He has no current complaints of abdominal pain says it has resolved.  He has been having bowel movements but is not aware where there has been any blood noted over the past few days. Not had any prior issues with colitis or diverticulitis, no prior colonoscopy to  his knowledge.  Lab-today WBC 9.3/hemoglobin 9.4/hematocrit 29.1 disease-based 10.5/31.0 on admission/MCV 89 BNP 345 CRP 13.7 Sodium 140/potassium 3.8/BUN 30/creatinine 1.64 INR 1.2  Medical  problems include morbid obesity with BMI of 46, chronic kidney disease, adult onset diabetes mellitus, chronic respiratory failure on chronic O2, hypertension and hyperlipidemia.   Past Medical History:  Diagnosis Date   Asthma    Chronic respiratory failure (HCC)    on 2L oxygen   Hyperlipidemia    Hypertension    Obese    OSA (obstructive sleep apnea)    Sleep study 08/2010 showed mild to moderate obstructive sleep apnea/hypopnea syndrome. However, never initiated CPAP   Venous stasis    Venous Statis Disease (03/28/2004)    No past surgical history on file.  Prior to Admission medications   Medication Sig Start Date End Date Taking? Authorizing Provider  acetaminophen (TYLENOL) 500 MG tablet Take 2 tablets (1,000 mg total) by mouth every 8 (eight) hours. Patient taking differently: Take 1,000 mg by mouth 3 (three) times daily. 09/14/19  Yes Rhetta Mura, MD  albuterol (PROVENTIL HFA;VENTOLIN HFA) 108 (90 Base) MCG/ACT inhaler Inhale 2 puffs into the lungs every 6 (six) hours as needed. For shortness of breath Patient taking differently: Inhale 2 puffs into the lungs every 6 (six) hours as needed ("for asthma"). 10/28/15  Yes Dorothea Ogle, MD  amLODipine (NORVASC) 10 MG tablet Take 10 mg by mouth daily.   Yes [provider]  ANTI-FUNGAL 1 % powder Apply 1 Application topically See admin instructions. Apply under the abdominal fold every day and night shift   Yes [provider]  ARTIFICIAL TEARS 1.4 % ophthalmic solution Place 1 drop into both eyes every 2 (two) hours as needed for dry eyes.   Yes [provider]  aspirin EC 81 MG tablet Take 81 mg by mouth daily.   Yes [provider]  atorvastatin (LIPITOR) 20 MG tablet Take 20 mg by mouth at bedtime.   Yes [provider]  Cholecalciferol (VITAMIN D3) 125 MCG (5000 UT) CAPS Take 5,000 Units by mouth daily. 06/07/20  Yes [provider]  docusate sodium (COLACE) 100 MG  capsule Take 100 mg by mouth in the morning and at bedtime.   Yes [provider]  folic acid (FOLVITE) 1 MG tablet Take 1 tablet (1 mg total) by mouth daily. Patient taking differently: Take 1 mg by mouth at bedtime. 07/11/20  Yes Osvaldo Shipper, MD  furosemide (LASIX) 40 MG tablet Take 40 mg by mouth 2 (two) times daily.   Yes [provider]  gabapentin (NEURONTIN) 100 MG capsule Take 100 mg by mouth 3 (three) times daily.   Yes [provider]  ipratropium-albuterol (DUONEB) 0.5-2.5 (3) MG/3ML SOLN Take 3 mLs by nebulization every 6 (six) hours as needed for shortness of breath or wheezing (or asthma). 07/04/20  Yes [provider]  lisinopril (ZESTRIL) 5 MG tablet Take 5 mg by mouth See admin instructions. Take 5 mg by mouth in the morning and hold for a Systolic reading less than 110   Yes [provider]  metFORMIN (GLUCOPHAGE) 500 MG tablet Take 500 mg by mouth daily with breakfast.   Yes [provider]  polyethylene glycol (MIRALAX / GLYCOLAX) 17 g packet Take 17 g by mouth daily as needed for mild constipation.   Yes [provider]  pregabalin (LYRICA) 75 MG capsule Take 75 mg by mouth 3 (three) times daily.   Yes [provider]  rOPINIRole (REQUIP) 1 MG tablet Take 1 mg by mouth every evening.   Yes [provider]  senna (SENOKOT) 8.6 MG TABS tablet Take 2 tablets by mouth at bedtime.   Yes [provider]  traMADol (ULTRAM) 50 MG tablet Take 1 tablet (50 mg total) by mouth every 8 (eight) hours as needed for moderate pain. Patient taking differently: Take 50 mg by mouth in the morning and at bedtime. 07/11/20  Yes Osvaldo Shipper, MD  vitamin B-12 (CYANOCOBALAMIN) 500 MCG tablet Take 1 tablet (500 mcg total) by mouth daily. 07/11/20  Yes Osvaldo Shipper, MD  Zinc Oxide (DESITIN EX) Apply 1 application  topically See admin instructions. Apply to sacrum every day and night shift   Yes [provider]  azithromycin (ZITHROMAX) 250 MG tablet daily Patient not taking: Reported on 08/25/2022 09/11/21   Marinda Elk, MD    Current Facility-Administered Medications  Medication Dose Route Frequency Provider Last Rate Last Admin   0.9 %  sodium chloride infusion  250 mL Intravenous Continuous Duayne Cal, NP   Stopped at 08/25/22 0000   acetaminophen (TYLENOL) tablet 650 mg  650 mg Oral Q6H PRN Conrad [place], MD   650 mg at 08/25/22 2118   amoxicillin-clavulanate (AUGMENTIN) 500-125 MG per tablet 1 tablet  1 tablet Oral Q8H Chand, Garnet Sierras, MD   1 tablet at 08/26/22 1255   carvedilol (COREG) tablet 3.125 mg  3.125 mg Oral BID WC Leroy Sea, MD       Chlorhexidine Gluconate Cloth 2 % PADS 6 each  6 each Topical Daily Martina Sinner, MD   6 each at 08/26/22 0840   docusate sodium (COLACE) capsule 100 mg  100 mg Oral BID PRN Martina Sinner, MD       Gerhardt's butt cream   Topical BID Janeann Forehand D, NP   Given at 08/26/22 0840   heparin injection 5,000 Units  5,000 Units Subcutaneous Q8H Melody Comas B, MD   5,000 Units at 08/26/22 1251   insulin aspart (novoLOG) injection 0-9 Units  0-9 Units Subcutaneous TID WC Cheri Fowler, MD   1 Units at 08/26/22 1255   ipratropium-albuterol (DUONEB) 0.5-2.5 (3) MG/3ML nebulizer solution 3 mL  3 mL Nebulization Q4H PRN Opyd, Lavone Neri, MD   3 mL at 08/26/22 1610   mupirocin ointment (BACTROBAN) 2 % 1 Application  1 Application Nasal BID Martina Sinner, MD   1 Application at 08/26/22 0841   Oral care mouth rinse  15 mL Mouth Rinse PRN Martina Sinner, MD       pantoprazole (PROTONIX) EC tablet 40 mg  40 mg Oral BID Lanier Clam, NP   40 mg at 08/26/22 0842   polyethylene glycol (MIRALAX / GLYCOLAX) packet 17 g  17 g Oral Daily PRN Martina Sinner, MD       QUEtiapine (SEROQUEL) tablet 25 mg  25 mg Oral QHS Leroy Sea, MD        Allergies as of 08/22/2022   (No Known Allergies)    No family  history on file.  Social History   Socioeconomic History   Marital status: Single    Spouse name: Not on file   Number of children: Not on file   Years of education: Not on file   Highest education level: Not on file  Occupational History   Not on file  Tobacco Use   Smoking status: Never   Smokeless tobacco: Never  Vaping Use   Vaping Use: Never used  Substance and Sexual Activity   Alcohol use: Yes    Comment: occasionally    Drug use: Never   Sexual activity: Not Currently  Other Topics Concern   Not on file  Social History Narrative   ** Merged History Encounter **       Social Determinants of Health   Financial Resource Strain: Low Risk  (01/29/2019)   Overall Financial Resource Strain (CARDIA)    Difficulty of Paying Living Expenses: Not very hard  Food Insecurity: Unknown (01/29/2019)   Hunger Vital Sign    Worried About Running Out of Food in the Last Year: Patient declined    Ran Out of Food in the Last Year: Patient declined  Transportation Needs: Unknown (01/29/2019)   PRAPARE - Administrator, Civil Service (Medical): Patient declined    Lack of Transportation (Non-Medical): Patient declined  Physical Activity: Unknown (01/29/2019)   Exercise Vital Sign    Days of Exercise per Week: Patient declined    Minutes of Exercise per Session: Patient declined  Stress: No Stress Concern Present (01/29/2019)   [employer] of Occupational Health - Occupational Stress Questionnaire    Feeling of Stress : Only a little  Social Connections: Unknown (01/29/2019)   Social Connection and Isolation Panel [NHANES]    Frequency of Communication with Friends and Family: Patient declined    Frequency of Social Gatherings with Friends and Family: Patient declined    Attends Religious Services: Patient declined    Database administrator or Organizations: Patient declined    Attends Banker Meetings: Patient declined    Marital Status: Patient  declined  Intimate Partner Violence: Unknown (01/29/2019)   Humiliation, Afraid, Rape, and Kick questionnaire    Fear of Current or Ex-Partner: Patient declined    Emotionally Abused: Patient declined    Physically Abused: Patient declined    Sexually Abused: Patient declined    Review of Systems: Pertinent positive and negative review of systems were noted in the above HPI section.  All other review of systems was otherwise negative.   Physical Exam: Vital signs in last 24 hours: Temp:  [98 F (36.7 C)-98.8 F (37.1 C)] 98 F (36.7 C) (05/05 1200) Pulse Rate:  [78-98] 80 (05/05 1200) Resp:  [12-20] 16 (05/05 1200) BP: (121-154)/(61-76) 137/61 (05/05 1200) SpO2:  [92 %-99 %] 99 % (05/05 1200) Weight:  [155.8 kg] 155.8 kg (05/05 0451) Last BM Date : 08/26/22 General:   Alert,  Well-developed, morbidly obese elderly white male pleasant and cooperative in NAD- pale Head:  Normocephalic and atraumatic. Eyes:  Sclera clear, no icterus.   Conjunctiva pink. Ears:  Normal auditory acuity. Nose:  No deformity, discharge,  or lesions. Mouth:  No deformity or lesions.   Neck:  Supple; no masses or thyromegaly. Lungs:  Clear throughout to auscultation.  Decreased breath sounds . Heart:  Regular rate and rhythm; no murmurs, clicks, rubs,  or gallops. Abdomen: Obese, nontender.  No discrete tenderness in the left lower quadrant or suprapubic area BS active,,nonpalp mass or hsm.  Umbilical hernia present Rectal: not done Msk:  Symmetrical without gross deformities. . Pulses:  Normal pulses noted. Extremities:  Without clubbing or edema. Neurologic:  Alert and  oriented x4;  grossly normal neurologically. Skin:  Intact without significant lesions or rashes.. Psych:  Alert and cooperative. Normal mood and affect.  Intake/Output from previous day: 05/04 0701 - 05/05 0700 In: 240 [P.O.:240]  Out: 4050 [Urine:4050] Intake/Output this shift: No intake/output data recorded.  Lab  Results: Recent Labs    08/24/22 0648 08/25/22 0038 08/26/22 0628  WBC 11.1* 7.8 9.3  HGB 11.0* 8.8* 9.4*  HCT 35.8* 28.5* 29.1*  PLT PLATELET CLUMPS NOTED ON SMEAR, UNABLE TO ESTIMATE 206 283   BMET Recent Labs    08/24/22 0648 08/25/22 0038 08/26/22 0628  NA 135 136 140  K 5.5* 4.4 3.8  CL 103 104 104  CO2 22 24 23   GLUCOSE 101* 101* 127*  BUN 75* 65* 34*  CREATININE 3.05* 2.44* 1.64*  CALCIUM 8.7* 8.5* 9.2   LFT Recent Labs    08/25/22 0038  PROT 6.0*  ALBUMIN 2.5*  AST 16  ALT 15  ALKPHOS 56  BILITOT 0.8   PT/INR Recent Labs    08/26/22 0628  LABPROT 14.7  INR 1.2   Hepatitis Panel No results for input(s): "HEPBSAG", "HCVAB", "HEPAIGM", "HEPBIGM" in the last 72 hours.    IMPRESSION:  #51 82 year old white male admitted on 08/22/2022 with septic shock after complaining of left-sided abdominal pain and had acute episodes of rectal bleeding per nursing home staff.  CT imaging showed a proctoscopy colitis with wall thickening of the distal sigmoid and rectum and adjacent fat stranding felt to be infectious versus inflammatory.  Blood cultures positive for staph hemolyticus, felt to be a contaminant. Covered with IV Zosyn.  Patient required initial pressor support, and BiPAP, did not require intubation. Transferred out of the ICU today, improving over the past few days and no current complaints of abdominal pain, and no evidence of active GI bleeding.  Etiology of the acute proctocolitis is not clear, rule out infectious, rule out underlying IBD, rule out ischemic out underlying malignancy.  #2 normocytic anemia acute on chronic #3 morbid obesity with BMI 46 4.  Chronic respiratory failure on chronic O2 -2 L 5.  Diabetes mellitus 6.  Chronic kidney disease 7.  Metabolic encephalopathy resolved 8.  Hypertension  Plan; diet as tolerates/carb modified Complete 7-day course of IV antibiotics-has been switched to Augmentin. Patient has not had prior  colonoscopy, and due to his other multiple comorbidities would not be able to undergo colonoscopy as an outpatient.  GI will plan for colonoscopy during this admission, may allow him another day or 2 to continue improvement prior to considering sedation.  Amy Esterwood PA-C 08/26/2022, 1:06 PM

## 2022-08-26 NOTE — Progress Notes (Signed)
E- link was made aware that pt is more confused now than at the beginning of the shift CCM Dr. Katrinka Blazing  was made aware that Pt was more confused now than earlier in the shift and pt refused CPAP and BiPAP. Venous blood gas was ordered and awaiting results.

## 2022-08-26 NOTE — Evaluation (Signed)
Occupational Therapy Evaluation Patient Details Name: Brian Silva MRN: [id number] DOB: [birth year] Today's Date: 08/26/2022   History of Present Illness Pt is a 82 y/o M presenting to ED on 5/1 from [facility] SNF with rectal bleeding and abdominal pain x4 days, admitted for septic shock and acute encephalopathy. PMH includes chronic respiratory failure on 2L O2, OSA not on CPAP, chronic incarcerated umbilical hernia, w/c bound, HTN   Clinical Impression   Pt reports needing assist at baseline from SNF staff for mobility, uses hoyer to transfer from bed > w/c. Reports he is able to self feed and perform grooming task but SNF staff assists with all other ADLs. Pt currently A & O x4, however with impaired cognition reports seeing "gnats" in the room. Pt declines OOB/EOB mobility at this time, able to reach over head and pull self  up in bed with min a +2 for securing bed pad beneath him. Pt presenting with impairments listed below, will follow acutely. Patient will benefit from continued inpatient follow up therapy, <3 hours/day to maximize safety and independence with ADLs/functional mobility.      Recommendations for follow up therapy are one component of a multi-disciplinary discharge planning process, led by the attending physician.  Recommendations may be updated based on patient status, additional functional criteria and insurance authorization.   Assistance Recommended at Discharge Frequent or constant Supervision/Assistance  Patient can return home with the following Two people to help with walking and/or transfers;A lot of help with bathing/dressing/bathroom;Assistance with cooking/housework;Direct supervision/assist for medications management;Direct supervision/assist for financial management;Assist for transportation;Help with stairs or ramp for entrance    Functional Status Assessment  Patient has had a recent decline in their functional status and demonstrates the ability to make  significant improvements in function in a reasonable and predictable amount of time.  Equipment Recommendations  Other (comment) (defer)    Recommendations for Other Services       Precautions / Restrictions Precautions Precautions: Fall Restrictions Weight Bearing Restrictions: No      Mobility Bed Mobility Overal bed mobility: Needs Assistance             General bed mobility comments: pulls self up in bed using overhead rails with min A +2 to manage pad    Transfers                   General transfer comment: pt declines      Balance                                           ADL either performed or assessed with clinical judgement   ADL Overall ADL's : Needs assistance/impaired Eating/Feeding: Bed level;Minimal assistance   Grooming: Bed level;Minimal assistance   Upper Body Bathing: Moderate assistance;Bed level   Lower Body Bathing: Maximal assistance;Bed level   Upper Body Dressing : Moderate assistance;Bed level   Lower Body Dressing: Bed level;Total assistance   Toilet Transfer: Maximal assistance;+2 for physical assistance   Toileting- Clothing Manipulation and Hygiene: Total assistance Toileting - Clothing Manipulation Details (indicate cue type and reason): purewick and rectal pouch     Functional mobility during ADLs: Maximal assistance;+2 for physical assistance       Vision   Vision Assessment?: No apparent visual deficits Additional Comments: reads therapist badge wtihout difficulty     Perception Perception Perception Tested?: No   Praxis  Praxis Praxis tested?: Not tested    Pertinent Vitals/Pain Pain Assessment Pain Assessment: No/denies pain     Hand Dominance Right   Extremity/Trunk Assessment Upper Extremity Assessment Upper Extremity Assessment: Overall WFL for tasks assessed   Lower Extremity Assessment Lower Extremity Assessment: Defer to PT evaluation       Communication  Communication Communication: No difficulties   Cognition Arousal/Alertness: Awake/alert Behavior During Therapy: Restless Overall Cognitive Status: No family/caregiver present to determine baseline cognitive functioning                                 General Comments: A & O x4, at times appears to be hallucinating, seeing "gnats" flying in the room, very hesitant and anxious appearing regarding movement     General Comments  VSS on RA    Exercises     Shoulder Instructions      Home Living Family/patient expects to be discharged to:: Skilled nursing facility                                 Additional Comments: Pt from LTC [facility]      Prior Functioning/Environment Prior Level of Function : Needs assist             Mobility Comments: hoyer lift at baseline to w/c, was working with therapy ADLs Comments: reports able to feed self and perform simple grooming tasks        OT Problem List: Decreased strength;Decreased range of motion;Decreased activity tolerance;Impaired balance (sitting and/or standing);Decreased cognition;Decreased safety awareness      OT Treatment/Interventions: Therapeutic exercise;Self-care/ADL training;Energy conservation;DME and/or AE instruction;Therapeutic activities;Balance training;Patient/family education    OT Goals(Current goals can be found in the care plan section) Acute Rehab OT Goals Patient Stated Goal: none stated OT Goal Formulation: With patient Time For Goal Achievement: 09/09/22 Potential to Achieve Goals: Good  OT Frequency: Min 1X/week    Co-evaluation PT/OT/SLP Co-Evaluation/Treatment: Yes Reason for Co-Treatment: For patient/therapist safety;To address functional/ADL transfers PT goals addressed during session: Mobility/safety with mobility OT goals addressed during session: Strengthening/ROM      AM-PAC OT "6 Clicks" Daily Activity     Outcome Measure Help from another person eating  meals?: A Little Help from another person taking care of personal grooming?: A Little Help from another person toileting, which includes using toliet, bedpan, or urinal?: Total Help from another person bathing (including washing, rinsing, drying)?: A Lot Help from another person to put on and taking off regular upper body clothing?: A Lot Help from another person to put on and taking off regular lower body clothing?: Total 6 Click Score: 12   End of Session Nurse Communication: Mobility status  Activity Tolerance: Patient tolerated treatment well Patient left: in bed;with call bell/phone within reach  OT Visit Diagnosis: Muscle weakness (generalized) (M62.81)                Time: 1610-9604 OT Time Calculation (min): 23 min Charges:  OT General Charges $OT Visit: 1 Visit OT Evaluation $OT Eval Low Complexity: 1 Low  Carver Fila, OTD, OTR/L SecureChat Preferred Acute Rehab [phone]   Shenetta Schnackenberg K Koonce 08/26/2022, 2:43 PM

## 2022-08-26 NOTE — Evaluation (Signed)
Physical Therapy Evaluation Patient Details Name: Brian Silva MRN: [id number] DOB: 11-Nov-1940 Today's Date: 08/26/2022  History of Present Illness  Pt is 82 yo male admitted on 08/22/22 with septic shock with L sided abdominal pain and rectal bleeding.  Pt found to have proctocolitis and acute metabolic encephalopathy. Noted pt with GI consult with plan for colonoscopy in a few days. Pt with hx including but not limited to HLD, HTN, obesity, OSA, venous stasis, chronic resp failure, umbilical hernia.  Clinical Impression  Pt admitted with above diagnosis.  He is from long term care at [facility].  Pt was w/c bound and reports using hoyer for months or more for transfers.  He does state was working with therapy using hoyer to get to w/c every 3 days.  Pt was very hesitant for any mobility today but was able to pull himself up in bed and lifted trunk from elevated HOB. Pt appears to be at baseline (hoyer to w/c) and does not require skilled acute PT services.        Recommendations for follow up therapy are one component of a multi-disciplinary discharge planning process, led by the attending physician.  Recommendations may be updated based on patient status, additional functional criteria and insurance authorization.  Follow Up Recommendations Can patient physically be transported by private vehicle: No     Assistance Recommended at Discharge Frequent or constant Supervision/Assistance  Patient can return home with the following  Two people to help with walking and/or transfers;Two people to help with bathing/dressing/bathroom    Equipment Recommendations None recommended by PT  Recommendations for Other Services       Functional Status Assessment Patient has not had a recent decline in their functional status     Precautions / Restrictions Precautions Precautions: Fall Restrictions Weight Bearing Restrictions: No      Mobility  Bed Mobility Overal bed mobility: Needs  Assistance             General bed mobility comments: pulls self up in bed using overhead rails  and lifting trunk for pillow placement using bed rails from [facility] elevated position    Transfers                   General transfer comment: pt declines - tried to encourage/educate but pt declined sitting EOB    Ambulation/Gait                  Stairs            Wheelchair Mobility    Modified Rankin (Stroke Patients Only)       Balance       Sitting balance - Comments: declined                                     Pertinent Vitals/Pain Pain Assessment Pain Assessment: No/denies pain    Home Living Family/patient expects to be discharged to:: Skilled nursing facility                   Additional Comments: Pt from LTC [facility]    Prior Function Prior Level of Function : Needs assist             Mobility Comments: hoyer lift at baseline to w/c, He reports he was working with therapy about every 3 days and they were the ones getting him up to the chair ADLs  Comments: reports able to feed self and perform simple grooming tasks     Hand Dominance   Dominant Hand: Right    Extremity/Trunk Assessment   Upper Extremity Assessment Upper Extremity Assessment: Overall WFL for tasks assessed    Lower Extremity Assessment Lower Extremity Assessment: LLE deficits/detail;RLE deficits/detail RLE Deficits / Details: ROM WFL ; MMT: ankle 4/5, knee 3/5 , hip 2/5 LLE Deficits / Details: ROM WFL ; MMT: ankle 4/5, knee 3/5 , hip 2/5    Cervical / Trunk Assessment Cervical / Trunk Assessment: Other exceptions Cervical / Trunk Exceptions: morbid obesity, difficult to assess as pt declined mobility  Communication   Communication: No difficulties  Cognition Arousal/Alertness: Awake/alert Behavior During Therapy: Restless Overall Cognitive Status: No family/caregiver present to determine baseline cognitive functioning                                  General Comments: A & O x4 (mentioning MD talked about colonosopy, aware date, aware of lines) but at times appears to be hallucinating, seeing "gnats" flying in the room, very hesitant and anxious appearing regarding movement (he would initially agree but when started movement would refuse). Also, stating when he has BM it is going through grate in bed and into floor - asking therapist to watch their step at arrival. Educated pt going to rectal tube and catheter.        General Comments General comments (skin integrity, edema, etc.): VSS on RA    Exercises     Assessment/Plan    PT Assessment Patient does not need any further PT services  PT Problem List         PT Treatment Interventions      PT Goals (Current goals can be found in the Care Plan section)  Acute Rehab PT Goals Patient Stated Goal: not stated PT Goal Formulation: All assessment and education complete, DC therapy    Frequency       Co-evaluation PT/OT/SLP Co-Evaluation/Treatment: Yes Reason for Co-Treatment: For patient/therapist safety;To address functional/ADL transfers (no tech avaialbe) PT goals addressed during session: Mobility/safety with mobility OT goals addressed during session: Strengthening/ROM       AM-PAC PT "6 Clicks" Mobility  Outcome Measure Help needed turning from your back to your side while in a flat bed without using bedrails?: Total Help needed moving from lying on your back to sitting on the side of a flat bed without using bedrails?: Total Help needed moving to and from a bed to a chair (including a wheelchair)?: Total Help needed standing up from a chair using your arms (e.g., wheelchair or bedside chair)?: Total Help needed to walk in hospital room?: Total Help needed climbing 3-5 steps with a railing? : Total 6 Click Score: 6    End of Session   Activity Tolerance: Other (comment) (Self limiting) Patient left: in bed;with bed alarm  set Nurse Communication: Mobility status PT Visit Diagnosis: Muscle weakness (generalized) (M62.81)    Time: 1610-9604 PT Time Calculation (min) (ACUTE ONLY): 21 min   Charges:   PT Evaluation $PT Eval Low Complexity: 1 Low          Rodneisha Bonnet, PT Acute Rehab [facility] [phone]   Rayetta Humphrey 08/26/2022, 2:53 PM

## 2022-08-26 NOTE — Progress Notes (Signed)
PROGRESS NOTE                                                                                                                                                                                                             Patient Demographics:    Brian Silva, is a 82 y.o. male, DOB - 06/09/1940, ZOX:[id number]  Outpatient Primary MD for the patient is Merrilyn Puma, DO    LOS - 4  Admit date - 08/22/2022    Chief Complaint  Patient presents with   Rectal Bleeding       Brief Narrative (HPI from H&P)   82 year old male with PMH as below, which is significant for, chronic respiratory failure on 2L, OSA not on CPAP, chronic incarcerated umbilical hernia, wheelchair bound, and HTN. He resides in [facility]. Presented to [facility] ED via EMS 5/1 with complaints of rectal bleeding x 2 episodes. Also had one episode of non-bloody emesis the night prior and abdominal pain for approximately 4 days.    He was diagnosed with sepsis workup suggestive of proctocolitis on CT scan, he was admitted in ICU required IV fluids and vasopressors, he was there for 4 days now transition to oral Augmentin and transferred to hospitalist service under my care on 08/26/2022 on day 4 of his hospital stay.   Significant Hospital Events:   5/1 concern for evolving sepsis in the setting of proctocolitis 5/2 progressive hypotension prompting insertion of A-line, revealed stable hemodynamics.  Appears hypercapnic on exam, BiPAP ordered 5/3 weaning pressors and adv diet to clears  5/4 off levo, transfer out  5/5 transferred to [facility] service under my care.   Subjective:    Landis Gandy today has, No headache, No chest pain, No abdominal pain - No Nausea, No new weakness tingling or numbness, no SOB   Assessment  & Plan :    Acute metabolic encephalopathy, improving  Likely due to sepsis improving, mild encephalopathy which will be managed  supportively, low-dose Seroquel, nighttime CPAP to continue currently not hypercapnic, minimize benzodiazepines and narcotics.    AoC hypoxic and hypercarbic resp failure OSA  -baseline 2L, nightly CPAP. P: -PRN and qHS CPAP  -IS, pulm hygiene, mobility     Septic shock 2/2 proctocolitis - with sepsis at the time of admission, also had 2 episodes of lower GI  bleed at the time of admission, 1 out of 4 blood cultures growing staph hemolyticus is most likely contaminant, will trend with procalcitonin, CRP, repeat blood cultures, currently on oral Augmentin, will consult GI as he may require colonoscopy.  No signs of ongoing GI bleed.  His pathophysiology has resolved.   Chronic HFpEF Hx HTN  P: - BP improving low-dose Coreg.   AKI on CKD II -Good urine output, has Foley catheter placed and ICU, renal function trending towards improvement.  Continue to monitor.   Possible GIB  -there was report of BRBPR but difficult to corroborate. Not ongoing at this point.  P: -cont PPI BID -PRN CBC  -defer GI consult for now   Anemia -- acute on chronic -iatrogenic lab draws, 2 episodes of lower GI bleed.  Monitor.   Chronic pain P: -holding home meds w encephalopathy    Physical debility, deconditioning P: -PT/OT         Condition - Extremely Guarded  Family Communication  :  None present  Code Status :  Full  Consults  :  PCCM  PUD Prophylaxis :  PPI   Procedures  :     Lower extremity venous duplex bilateral.  No DVT.  Echocardiogram.  1. Left ventricular ejection fraction, by estimation, is 60 to 65%. The left ventricle has normal function. The left ventricle has no regional wall motion abnormalities. Left ventricular diastolic parameters are consistent with Grade I diastolic dysfunction (impaired relaxation).  2. Right ventricular systolic function is normal. The right ventricular size is normal.  3. The mitral valve is normal in structure. No evidence of mitral valve  regurgitation. No evidence of mitral stenosis.  4. The aortic valve has an indeterminant number of cusps. Aortic valve regurgitation is not visualized. No aortic stenosis is present.  5. Pulmonic valve regurgitation not well visualized.  6. The inferior vena cava is dilated in size with <50% respiratory variability, suggesting right atrial pressure of 15 mmHg.   CT abdomen pelvis.  Proctocolitis.      Disposition Plan  :    Status is: Inpatient   DVT Prophylaxis  :    heparin injection 5,000 Units Start: 08/22/22 2200    Lab Results  Component Value Date   PLT 283 08/26/2022    Diet :  Diet Order             Diet clear liquid Room service appropriate? Yes; Fluid consistency: Thin  Diet effective now                    Inpatient Medications  Scheduled Meds:  amoxicillin-clavulanate  1 tablet Oral Q8H   Chlorhexidine Gluconate Cloth  6 each Topical Daily   Gerhardt's butt cream   Topical BID   heparin  5,000 Units Subcutaneous Q8H   insulin aspart  0-9 Units Subcutaneous TID WC   mupirocin ointment  1 Application Nasal BID   pantoprazole  40 mg Oral BID   Continuous Infusions:  sodium chloride Stopped (08/25/22 0000)   PRN Meds:.acetaminophen, docusate sodium, ipratropium-albuterol, mouth rinse, polyethylene glycol      Objective:   Vitals:   08/26/22 0313 08/26/22 0400 08/26/22 0451 08/26/22 0800  BP:  138/66  121/69  Pulse:  98  87  Resp:  20  12  Temp:  98.5 F (36.9 C)  98.2 F (36.8 C)  TempSrc:  Oral  Oral  SpO2: 98%   95%  Weight:   (!) 155.8 kg  Height:        Wt Readings from Last 3 Encounters:  08/26/22 (!) 155.8 kg  10/22/21 (!) 167.8 kg  09/10/21 (!) 165.1 kg     Intake/Output Summary (Last 24 hours) at 08/26/2022 1130 Last data filed at 08/26/2022 0355 Gross per 24 hour  Intake 240 ml  Output 3300 ml  Net -3060 ml     Physical Exam  Awake but mildly confused oriented x 1, No new F.N deficits, foley [place].AT,PERRAL Supple  Neck, No JVD,   Symmetrical Chest wall movement, Good air movement bilaterally, CTAB RRR,No Gallops,Rubs or new Murmurs,  +ve B.Sounds, Abd Soft, No tenderness,   No Cyanosis, Clubbing or edema       Data Review:    Recent Labs  Lab 08/22/22 1708 08/22/22 2144 08/23/22 0615 08/24/22 0648 08/25/22 0038 08/26/22 0628  WBC 16.7*  --  17.4* 11.1* 7.8 9.3  HGB 10.4* 10.5* 10.1* 11.0* 8.8* 9.4*  HCT 34.1* 31.0* 33.3* 35.8* 28.5* 29.1*  PLT 287  --  302 PLATELET CLUMPS NOTED ON SMEAR, UNABLE TO ESTIMATE 206 283  MCV 93.4  --  92.8 91.8 91.3 89.3  MCH 28.5  --  28.1 28.2 28.2 28.8  MCHC 30.5  --  30.3 30.7 30.9 32.3  RDW 14.4  --  14.4 14.4 13.9 13.4  LYMPHSABS 1.1  --  2.3  --   --   --   MONOABS 1.1*  --  1.3*  --   --   --   EOSABS 0.0  --  0.2  --   --   --   BASOSABS 0.1  --  0.1  --   --   --     Recent Labs  Lab 08/22/22 1708 08/22/22 1809 08/22/22 2144 08/23/22 0615 08/23/22 0822 08/23/22 0932 08/23/22 1554 08/23/22 2155 08/24/22 0648 08/25/22 0038 08/26/22 0628  NA 136  --    < > 134*  --   --  136 135 135 136 140  K 5.5*  --    < > 5.2*  --   --  6.4* 4.9 5.5* 4.4 3.8  CL 102  --   --  101  --   --  104 101 103 104 104  CO2 23  --   --  21*  --   --  19* 24 22 24 23   ANIONGAP 11  --   --  12  --   --  13 10 10 8 13   GLUCOSE 138*  --   --  133*  --   --  136* 109* 101* 101* 127*  BUN 91*  --   --  82*  --   --  85* 79* 75* 65* 34*  CREATININE 3.51*  --   --  3.33*  --   --  3.28* 3.09* 3.05* 2.44* 1.64*  AST 17  --   --   --   --   --   --   --  20 16  --   ALT 16  --   --   --   --   --   --   --  18 15  --   ALKPHOS 67  --   --   --   --   --   --   --  58 56  --   BILITOT 0.4  --   --   --   --   --   --   --  0.6 0.8  --   ALBUMIN 2.7*  --   --   --   --   --   --   --  2.7* 2.5*  --   CRP  --   --   --   --   --   --   --   --   --   --  13.7*  PROCALCITON  --   --   --   --   --   --   --   --   --   --  0.17  LATICACIDVEN  --  1.2  --   --  1.1  --    --   --   --   --   --   INR  --   --   --   --   --  1.2  --   --   --   --  1.2  HGBA1C  --   --   --  6.2*  --   --   --   --   --   --   --   AMMONIA  --   --   --   --   --   --   --   --   --   --  46*  BNP 31.5  --   --   --   --   --   --   --   --   --  345.3*  MG  --   --   --   --   --   --   --   --   --   --  2.0  CALCIUM 8.5*  --   --  8.4*  --   --  8.6* 8.5* 8.7* 8.5* 9.2   < > = values in this interval not displayed.      Recent Labs  Lab 08/22/22 1708 08/22/22 1809 08/23/22 0615 08/23/22 1610 08/23/22 0932 08/23/22 1554 08/23/22 2155 08/24/22 0648 08/25/22 0038 08/26/22 0628  CRP  --   --   --   --   --   --   --   --   --  13.7*  PROCALCITON  --   --   --   --   --   --   --   --   --  0.17  LATICACIDVEN  --  1.2  --  1.1  --   --   --   --   --   --   INR  --   --   --   --  1.2  --   --   --   --  1.2  HGBA1C  --   --  6.2*  --   --   --   --   --   --   --   AMMONIA  --   --   --   --   --   --   --   --   --  46*  BNP 31.5  --   --   --   --   --   --   --   --  345.3*  MG  --   --   --   --   --   --   --   --   --  2.0  CALCIUM 8.5*  --  8.4*  --   --  8.6* 8.5* 8.7* 8.5* 9.2    Lab Results  Component Value Date   HGBA1C 6.2 (H) 08/23/2022    No results for input(s): "TSH", "T4TOTAL", "T3FREE", "THYROIDAB" in the last 72 hours.  Invalid input(s): "FREET3" ------------------------------------------------------------------------------------------------------------------ Cardiac Enzymes No results for input(s): "CKMB", "TROPONINI", "MYOGLOBIN" in the last 168 hours.  Invalid input(s): "CK"  Radiology Reports DG Chest Port 1 View  Result Date: 08/26/2022 CLINICAL DATA:  82 year old male with history of shortness of breath. EXAM: PORTABLE CHEST 1 VIEW COMPARISON:  Chest x-ray 08/22/2022. FINDINGS: Lung volumes are low, and image is under penetrated limiting the diagnostic sensitivity and specificity of today's examination. With these limitations  in mind, there is no definite consolidative airspace disease. No pleural effusions. No pneumothorax. No pulmonary nodule or mass noted. Pulmonary vasculature and the cardiomediastinal silhouette are within normal limits. Atherosclerosis in the thoracic aorta. IMPRESSION: 1. Low lung volumes without radiographic evidence of acute cardiopulmonary disease. 2. Aortic atherosclerosis. Electronically Signed   By: Trudie Reed M.D.   On: 08/26/2022 06:53   VAS [name] LOWER EXTREMITY VENOUS (DVT)  Result Date: 08/25/2022  Lower Venous DVT Study Patient Name:  KYANDRE CHLUDZINSKI  Date of Exam:   08/24/2022 Medical Rec #: [id number]         Accession #:    [id number] Date of Birth: 02-01-1941         Patient Gender: M Patient Age:   59 years Exam Location:  [facility] Procedure:      VAS [name] LOWER EXTREMITY VENOUS (DVT) Referring Phys: Felisa Bonier --------------------------------------------------------------------------------  Indications: Edema.  Limitations: Body habitus. Comparison Study: No previous study. Performing Technologist: McKayla Maag RVT, VT  Examination Guidelines: A complete evaluation includes B-mode imaging, spectral Doppler, color Doppler, and power Doppler as needed of all accessible portions of each vessel. Bilateral testing is considered an integral part of a complete examination. Limited examinations for reoccurring indications may be performed as noted. The reflux portion of the exam is performed with the patient in reverse Trendelenburg.  +---------+---------------+---------+-----------+----------+-------------------+ RIGHT    CompressibilityPhasicitySpontaneityPropertiesThrombus Aging      +---------+---------------+---------+-----------+----------+-------------------+ CFV      Full           Yes      Yes                                      +---------+---------------+---------+-----------+----------+-------------------+ SFJ      Full                                                              +---------+---------------+---------+-----------+----------+-------------------+ FV Prox  Full                                                             +---------+---------------+---------+-----------+----------+-------------------+ FV Mid   Full                                                             +---------+---------------+---------+-----------+----------+-------------------+  FV DistalFull                                                             +---------+---------------+---------+-----------+----------+-------------------+ PFV      Full                                                             +---------+---------------+---------+-----------+----------+-------------------+ POP      Full           Yes      Yes                                      +---------+---------------+---------+-----------+----------+-------------------+ PTV      Full                                         Not well visualized +---------+---------------+---------+-----------+----------+-------------------+ PERO                                                  Not visualized      +---------+---------------+---------+-----------+----------+-------------------+   +---------+---------------+---------+-----------+----------+-------------------+ LEFT     CompressibilityPhasicitySpontaneityPropertiesThrombus Aging      +---------+---------------+---------+-----------+----------+-------------------+ CFV      Full           Yes      Yes                                      +---------+---------------+---------+-----------+----------+-------------------+ SFJ      Full                                                             +---------+---------------+---------+-----------+----------+-------------------+ FV Prox  Full                                                              +---------+---------------+---------+-----------+----------+-------------------+ FV Mid   Full                                                             +---------+---------------+---------+-----------+----------+-------------------+ FV DistalFull                                                             +---------+---------------+---------+-----------+----------+-------------------+  PFV      Full                                                             +---------+---------------+---------+-----------+----------+-------------------+ POP      Full           Yes      Yes                                      +---------+---------------+---------+-----------+----------+-------------------+ PTV      Full                                         Not well visualized +---------+---------------+---------+-----------+----------+-------------------+ PERO                                                  Not visualized      +---------+---------------+---------+-----------+----------+-------------------+     Summary: RIGHT: - There is no evidence of deep vein thrombosis in the lower extremity. However, portions of this examination were limited- see technologist comments above.  - No cystic structure found in the popliteal fossa.  LEFT: - There is no evidence of deep vein thrombosis in the lower extremity. However, portions of this examination were limited- see technologist comments above.  - No cystic structure found in the popliteal fossa.  *See table(s) above for measurements and observations. Electronically signed by Coral Else MD on 08/25/2022 at 3:00:51 PM.    Final    ECHOCARDIOGRAM COMPLETE  Result Date: 08/24/2022    ECHOCARDIOGRAM REPORT   Patient Name:   DIEON JAMAL Date of Exam: 08/24/2022 Medical Rec #:  [id number]        Height:       72.0 in Accession #:    [id number]       Weight:       351.2 lb Date of Birth:  January 19, 1941        BSA:          2.707 m Patient Age:     35 years         BP:           87/64 mmHg Patient Gender: M                HR:           67 bpm. Exam Location:  Inpatient Procedure: 2D Echo, Cardiac Doppler, Color Doppler and Intracardiac            Opacification Agent Indications:    Shock R57.9  History:        Patient has prior history of Echocardiogram examinations, most                 recent 07/09/2020. CHF, Signs/Symptoms:Syncope; Risk                 Factors:Hypertension, Sleep Apnea, Diabetes and Dyslipidemia.  Sonographer:    Lucendia Herrlich Referring Phys: [id number] Ivor Costa MEIER  Sonographer Comments: Technically  difficult study due to poor echo windows, suboptimal parasternal window, suboptimal apical window, suboptimal subcostal window and patient is obese. IMPRESSIONS  1. Left ventricular ejection fraction, by estimation, is 60 to 65%. The left ventricle has normal function. The left ventricle has no regional wall motion abnormalities. Left ventricular diastolic parameters are consistent with Grade I diastolic dysfunction (impaired relaxation).  2. Right ventricular systolic function is normal. The right ventricular size is normal.  3. The mitral valve is normal in structure. No evidence of mitral valve regurgitation. No evidence of mitral stenosis.  4. The aortic valve has an indeterminant number of cusps. Aortic valve regurgitation is not visualized. No aortic stenosis is present.  5. Pulmonic valve regurgitation not well visualized.  6. The inferior vena cava is dilated in size with <50% respiratory variability, suggesting right atrial pressure of 15 mmHg. FINDINGS  Left Ventricle: Left ventricular ejection fraction, by estimation, is 60 to 65%. The left ventricle has normal function. The left ventricle has no regional wall motion abnormalities. Definity contrast agent was given IV to delineate the left ventricular  endocardial borders. The left ventricular internal cavity size was normal in size. There is no left ventricular hypertrophy.  Left ventricular diastolic function could not be evaluated due to atrial fibrillation. Left ventricular diastolic parameters are  consistent with Grade I diastolic dysfunction (impaired relaxation). Right Ventricle: The right ventricular size is normal. Right ventricular systolic function is normal. Left Atrium: Left atrial size was normal in size. Right Atrium: Right atrial size was normal in size. Pericardium: Trivial pericardial effusion is present. Mitral Valve: The mitral valve is normal in structure. No evidence of mitral valve regurgitation. No evidence of mitral valve stenosis. Tricuspid Valve: The tricuspid valve is normal in structure. Tricuspid valve regurgitation is not demonstrated. No evidence of tricuspid stenosis. Aortic Valve: The aortic valve has an indeterminant number of cusps. Aortic valve regurgitation is not visualized. No aortic stenosis is present. Aortic valve peak gradient measures 11.4 mmHg. Pulmonic Valve: The pulmonic valve was not well visualized. Pulmonic valve regurgitation not well visualized. Aorta: The aortic root is normal in size and structure. Venous: The inferior vena cava is dilated in size with less than 50% respiratory variability, suggesting right atrial pressure of 15 mmHg. IAS/Shunts: No atrial level shunt detected by color flow Doppler.  LEFT VENTRICLE PLAX 2D LVIDd:         4.20 cm   Diastology LVIDs:         2.70 cm   LV e' medial:    6.84 cm/s LV PW:         1.60 cm   LV E/e' medial:  13.8 LV IVS:        1.10 cm   LV e' lateral:   9.32 cm/s LVOT diam:     2.40 cm   LV E/e' lateral: 10.1 LV SV:         81 LV SV Index:   30 LVOT Area:     4.52 cm  RIGHT VENTRICLE             IVC RV S prime:     17.10 cm/s  IVC diam: 2.20 cm TAPSE (M-mode): 1.5 cm AORTIC VALVE AV Area (Vmax): 2.56 cm AV Vmax:        169.00 cm/s AV Peak Grad:   11.4 mmHg LVOT Vmax:      95.72 cm/s LVOT Vmean:     63.140 cm/s LVOT VTI:       0.180  m  AORTA Ao Root diam: 3.50 cm Ao Asc diam:  3.20 cm  MITRAL VALVE MV Area (PHT): 3.91 cm     SHUNTS MV Decel Time: 194 msec     Systemic VTI:  0.18 m MV E velocity: 94.30 cm/s   Systemic Diam: 2.40 cm MV A velocity: 102.00 cm/s MV E/A ratio:  0.92 Olga Millers MD Electronically signed by Olga Millers MD Signature Date/Time: 08/24/2022/3:04:02 PM    Final    CT ABDOMEN PELVIS WO CONTRAST  Result Date: 08/22/2022 CLINICAL DATA:  Left lower quadrant pain. New renal dysfunction. Concern for GI bleed. EXAM: CT ABDOMEN AND PELVIS WITHOUT CONTRAST TECHNIQUE: Multidetector CT imaging of the abdomen and pelvis was performed following the standard protocol without IV contrast. RADIATION DOSE REDUCTION: This exam was performed according to the departmental dose-optimization program which includes automated exposure control, adjustment of the mA and/or kV according to patient size and/or use of iterative reconstruction technique. COMPARISON:  CT renal stone protocol 07/06/2020 FINDINGS: Lower chest: Bibasilar atelectasis/scarring.  No acute abnormality. Hepatobiliary: Unremarkable noncontrast appearance of the liver. No radiopaque stones. No biliary dilation. Pancreas: Unremarkable. Spleen: Unremarkable. Adrenals/Urinary Tract: Normal adrenal glands. Bilateral cortical renal scarring. No urinary calculi or hydronephrosis. Bladder is unremarkable. Stomach/Bowel: There is wall thickening of the distal sigmoid colon and rectum with adjacent pericolonic fat stranding. Normal caliber large and small bowel. Normal appendix. Stomach is within normal limits. Vascular/Lymphatic: Aortic atherosclerotic calcification. No lymphadenopathy. Reproductive: Unremarkable. Other: Trace free fluid in the pelvis. No free intraperitoneal air. No organized fluid collection or abscess. Fat containing umbilical hernia. Musculoskeletal: No acute fracture. Chronic superior endplate compression fracture of T12. IMPRESSION: Findings are consistent with proctocolitis, favored infectious or inflammatory.  Active GI bleeding can not be assessed for on noncontrast exam. Colonoscopy is recommended after resolution of symptoms to exclude underlying mass. Aortic Atherosclerosis (ICD10-I70.0). Electronically Signed   By: Minerva Fester M.D.   On: 08/22/2022 20:24   DG Chest Portable 1 View  Result Date: 08/22/2022 CLINICAL DATA:  morbidly obese, on O2 EXAM: PORTABLE CHEST 1 VIEW COMPARISON:  CXR 09/09/21 FINDINGS: No pleural effusion. No pneumothorax. Borderline cardiomegaly. No radiographically apparent displaced rib fractures. Visualized upper abdomen is unremarkable. Lateral view is essentially nondiagnostic due to superimposition of soft tissues. Within this limitation, vertebral body heights are preserved. Asymmetric elevation of the right hemidiaphragm. Aortic atherosclerotic calcifications. IMPRESSION: 1. No focal airspace opacity. 2. Borderline cardiomegaly. Electronically Signed   By: Lorenza Cambridge M.D.   On: 08/22/2022 17:12      Signature  -   Susa Raring M.D on 08/26/2022 at 11:30 AM   -  To page go to [email or web address]

## 2022-08-27 DIAGNOSIS — D649 Anemia, unspecified: Secondary | ICD-10-CM

## 2022-08-27 DIAGNOSIS — G9341 Metabolic encephalopathy: Secondary | ICD-10-CM | POA: Diagnosis not present

## 2022-08-27 DIAGNOSIS — N179 Acute kidney failure, unspecified: Secondary | ICD-10-CM

## 2022-08-27 DIAGNOSIS — A419 Sepsis, unspecified organism: Secondary | ICD-10-CM | POA: Diagnosis not present

## 2022-08-27 DIAGNOSIS — R6521 Severe sepsis with septic shock: Secondary | ICD-10-CM | POA: Diagnosis not present

## 2022-08-27 DIAGNOSIS — R195 Other fecal abnormalities: Secondary | ICD-10-CM | POA: Diagnosis not present

## 2022-08-27 LAB — CBC WITH DIFFERENTIAL/PLATELET
Abs Immature Granulocytes: 0.03 10*3/uL (ref 0.00–0.07)
Basophils Absolute: 0.1 10*3/uL (ref 0.0–0.1)
Basophils Relative: 1 %
Eosinophils Absolute: 0.4 10*3/uL (ref 0.0–0.5)
Eosinophils Relative: 5 %
HCT: 28.7 % — ABNORMAL LOW (ref 39.0–52.0)
Hemoglobin: 9.4 g/dL — ABNORMAL LOW (ref 13.0–17.0)
Immature Granulocytes: 0 %
Lymphocytes Relative: 18 %
Lymphs Abs: 1.3 10*3/uL (ref 0.7–4.0)
MCH: 28.5 pg (ref 26.0–34.0)
MCHC: 32.8 g/dL (ref 30.0–36.0)
MCV: 87 fL (ref 80.0–100.0)
Monocytes Absolute: 0.7 10*3/uL (ref 0.1–1.0)
Monocytes Relative: 10 %
Neutro Abs: 4.6 10*3/uL (ref 1.7–7.7)
Neutrophils Relative %: 66 %
Platelets: 293 10*3/uL (ref 150–400)
RBC: 3.3 MIL/uL — ABNORMAL LOW (ref 4.22–5.81)
RDW: 13.5 % (ref 11.5–15.5)
WBC: 7 10*3/uL (ref 4.0–10.5)
nRBC: 0 % (ref 0.0–0.2)

## 2022-08-27 LAB — IRON AND TIBC
Iron: 42 ug/dL — ABNORMAL LOW (ref 45–182)
Saturation Ratios: 20 % (ref 17.9–39.5)
TIBC: 209 ug/dL — ABNORMAL LOW (ref 250–450)
UIBC: 167 ug/dL

## 2022-08-27 LAB — PROCALCITONIN: Procalcitonin: 0.11 ng/mL

## 2022-08-27 LAB — BASIC METABOLIC PANEL
Anion gap: 9 (ref 5–15)
BUN: 19 mg/dL (ref 8–23)
CO2: 24 mmol/L (ref 22–32)
Calcium: 8.9 mg/dL (ref 8.9–10.3)
Chloride: 107 mmol/L (ref 98–111)
Creatinine, Ser: 1.4 mg/dL — ABNORMAL HIGH (ref 0.61–1.24)
GFR, Estimated: 51 mL/min — ABNORMAL LOW (ref >60)
Glucose, Bld: 116 mg/dL — ABNORMAL HIGH (ref 70–99)
Potassium: 3.5 mmol/L (ref 3.5–5.1)
Sodium: 140 mmol/L (ref 135–145)

## 2022-08-27 LAB — CULTURE, BLOOD (ROUTINE X 2): Special Requests: ADEQUATE

## 2022-08-27 LAB — RETICULOCYTES
Immature Retic Fract: 18.8 % — ABNORMAL HIGH (ref 2.3–15.9)
RBC.: 3.28 MIL/uL — ABNORMAL LOW (ref 4.22–5.81)
Retic Count, Absolute: 52.8 10*3/uL (ref 19.0–186.0)
Retic Ct Pct: 1.6 % (ref 0.4–3.1)

## 2022-08-27 LAB — C-REACTIVE PROTEIN: CRP: 9.7 mg/dL — ABNORMAL HIGH (ref <1.0)

## 2022-08-27 LAB — GLUCOSE, CAPILLARY
Glucose-Capillary: 98 mg/dL (ref 70–99)
Glucose-Capillary: 122 mg/dL — ABNORMAL HIGH (ref 70–99)
Glucose-Capillary: 125 mg/dL — ABNORMAL HIGH (ref 70–99)
Glucose-Capillary: 111 mg/dL — ABNORMAL HIGH (ref 70–99)

## 2022-08-27 LAB — FOLATE: Folate: >40 ng/mL (ref >5.9)

## 2022-08-27 LAB — MAGNESIUM: Magnesium: 2 mg/dL (ref 1.7–2.4)

## 2022-08-27 LAB — FERRITIN: Ferritin: 403 ng/mL — ABNORMAL HIGH (ref 24–336)

## 2022-08-27 LAB — BRAIN NATRIURETIC PEPTIDE: B Natriuretic Peptide: 55.6 pg/mL (ref 0.0–100.0)

## 2022-08-27 LAB — VITAMIN B12: Vitamin B-12: 1064 pg/mL — ABNORMAL HIGH (ref 180–914)

## 2022-08-27 MED ORDER — TAMSULOSIN HCL 0.4 MG PO CAPS
0.4000 mg | ORAL_CAPSULE | Freq: Every day | ORAL | Status: DC
  Administered 2022-08-27 – 2022-08-30 (×4): 0.4 mg via ORAL
  Filled 2022-08-27 (×4): qty 1

## 2022-08-27 MED ORDER — POTASSIUM CHLORIDE CRYS ER 20 MEQ PO TBCR
40.0000 meq | EXTENDED_RELEASE_TABLET | Freq: Once | ORAL | Status: AC
  Administered 2022-08-27: 40 meq via ORAL
  Filled 2022-08-27: qty 2

## 2022-08-27 NOTE — Progress Notes (Signed)
I spoke with Dr. Thedore Mins about either placing an order for pt to have a foley catheter or discontinuing the foley catheter that the pt currently have.

## 2022-08-27 NOTE — Progress Notes (Signed)
Progress Note  Primary GI: Unassigned  LOS: 5 days   Chief Complaint: Normocytic anemia, heme positive stool, distal sigmoid/rectal wall thickening on CT   Subjective   Patient somnolent during interview. Answered questions in a series of "grunts." Denies abdominal pain, nausea, and vomiting. Patient then stated "there was another girl already in here at 7:15 and woke me to ask similar questions, please leave me alone and let me rest." Patient declined physical exam and asked provider to leave the room  No family was present at the time of my evaluation.    Objective   Vital signs in last 24 hours: Temp:  [98 F (36.7 C)-99 F (37.2 C)] 98.9 F (37.2 C) (05/06 0757) Pulse Rate:  [63-80] 70 (05/06 0757) Resp:  [15-20] 15 (05/06 0757) BP: (127-168)/(61-72) 160/68 (05/06 0757) SpO2:  [93 %-99 %] 98 % (05/06 0757) Last BM Date : 08/26/22 Last BM recorded by nurses in past 5 days Stool Type: Type 7 (Liquid consistency with no solid pieces) (08/27/2022 12:00 AM)  General:   male in no acute distress, declined physical exam Neurologic:  Alert and  oriented x4;  No focal deficits.  Psych:  uncooperative. Normal mood and affect.  Intake/Output from previous day: 05/05 0701 - 05/06 0700 In: 1920 [P.O.:1920] Out: 2700 [Urine:2700] Intake/Output this shift: No intake/output data recorded.  Studies/Results: DG Chest Port 1 View  Result Date: 08/26/2022 CLINICAL DATA:  82 year old male with history of shortness of breath. EXAM: PORTABLE CHEST 1 VIEW COMPARISON:  Chest x-ray 08/22/2022. FINDINGS: Lung volumes are low, and image is under penetrated limiting the diagnostic sensitivity and specificity of today's examination. With these limitations in mind, there is no definite consolidative airspace disease. No pleural effusions. No pneumothorax. No pulmonary nodule or mass noted. Pulmonary vasculature and the cardiomediastinal silhouette are within normal limits. Atherosclerosis in the  thoracic aorta. IMPRESSION: 1. Low lung volumes without radiographic evidence of acute cardiopulmonary disease. 2. Aortic atherosclerosis. Electronically Signed   By: Trudie Reed M.D.   On: 08/26/2022 06:53    Lab Results: Recent Labs    08/25/22 0038 08/26/22 0628 08/27/22 0248  WBC 7.8 9.3 7.0  HGB 8.8* 9.4* 9.4*  HCT 28.5* 29.1* 28.7*  PLT 206 283 293   BMET Recent Labs    08/25/22 0038 08/26/22 0628 08/27/22 0248  NA 136 140 140  K 4.4 3.8 3.5  CL 104 104 107  CO2 24 23 24   GLUCOSE 101* 127* 116*  BUN 65* 34* 19  CREATININE 2.44* 1.64* 1.40*  CALCIUM 8.5* 9.2 8.9   LFT Recent Labs    08/25/22 0038  PROT 6.0*  ALBUMIN 2.5*  AST 16  ALT 15  ALKPHOS 56  BILITOT 0.8   PT/INR Recent Labs    08/26/22 0628  LABPROT 14.7  INR 1.2     Scheduled Meds:  amoxicillin-clavulanate  1 tablet Oral Q8H   carvedilol  3.125 mg Oral BID WC   Chlorhexidine Gluconate Cloth  6 each Topical Daily   Gerhardt's butt cream   Topical BID   heparin  5,000 Units Subcutaneous Q8H   insulin aspart  0-9 Units Subcutaneous TID WC   mupirocin ointment  1 Application Nasal BID   pantoprazole  40 mg Oral BID   QUEtiapine  25 mg Oral QHS   tamsulosin  0.4 mg Oral Daily   Continuous Infusions:  sodium chloride Stopped (08/25/22 0000)      Patient profile:   82 year old white male  admitted on 08/22/2022 with septic shock after complaining of left-sided abdominal pain and had acute episodes of rectal bleeding per nursing home staff.   No previous EGD or colonoscopy.  No prior personal or family history of significant GI issues.  He is wheelchair-bound and chronic respiratory failure on home O2, residing at [facility].   Impression:   Normocytic anemia acute on chronic, heme positive stool Distal sigmoid/rectal wall thickening on CT  - FOBT positive - AKI on admission, now improving with BUN/creatinine 19/1.4 (baseline creatinine ~1.8-1.9) - INR 1.2 -Iron 42, TIBC 209,  saturation 20% (improved from 2 years ago) -Ferritin 403, likely acute phase reactant - hgb 9.4, stable - BP 160/68 Patient's vitals have improved since ICU and is no longer on pressors.  Sepsis and metabolic encephalopathy improving.  Patient did not participate in interview, interview was cut short.  Patient will likely need EGD/colonoscopy at some point whether it be inpatient or outpatient.  Though with multiple comorbidities he will need procedure done at the hospital even as an outpatient.  Sepsis Metabolic encephalopathy-improved    Plan:   - plan for EGD/colonoscopy for evaluation of anemia and heme positive stool at some point, whether inpatient or outpatient. Appears patient is currently stable enough to proceed at this time though uncooperative during exam - continue supportive care - Continue daily CBC and transfuse as needed to maintain HGB > 7  - Protonix 40mg  IV BID  Kyran Connaughton M Shaana Acocella  08/27/2022, 9:23 AM

## 2022-08-27 NOTE — NC FL2 (Signed)
[place] MEDICAID FL2 LEVEL OF CARE FORM     IDENTIFICATION  Patient Name: Brian Silva Birthdate: [birth year] Sex: male Admission Date (Current Location): 08/22/2022  [facility] and [place] Number:  Producer, television/film/video and Address:  [facility] [place], [address] [place], [place] [address]      Provider Number: [id number]  Attending Physician Name and Address:  Leroy Sea, MD  Relative Name and Phone Number:       Current Level of Care: Hospital Recommended Level of Care: Skilled Nursing Facility Prior Approval Number:    Date Approved/Denied:   PASRR Number: [id number]  Discharge Plan: SNF    Current Diagnoses: Patient Active Problem List   Diagnosis Date Noted   ABLA (acute blood loss anemia) 08/26/2022   Heme positive stool 08/26/2022   Proctocolitis 08/25/2022   Encephalopathy acute 08/25/2022   Sepsis with encephalopathy and septic shock (HCC) 08/25/2022   Gastrointestinal hemorrhage 08/25/2022   Acute on chronic respiratory failure with hypoxia and hypercapnia (HCC) 08/24/2022   Acute metabolic encephalopathy 08/24/2022   Septic shock (HCC) 08/22/2022   Weakness 09/10/2021   Cervical radiculopathy 09/10/2021   Community acquired pneumonia 09/09/2021   Bacteremia    Acute renal failure superimposed on stage 2 chronic kidney disease (HCC) 07/06/2020   Metabolic acidosis 07/06/2020   Uremia 07/06/2020   Hyperkalemia 07/06/2020   Chronic diastolic CHF (congestive heart failure) (HCC) 07/06/2020   Pneumonia of both lower lobes due to infectious organism 12/23/2019   CAP (community acquired pneumonia) 12/22/2019   Compression fracture of body of thoracic vertebra (HCC) 09/10/2019   Fall at home, initial encounter 09/10/2019   AKI (acute kidney injury) (HCC) 01/17/2018   Syncope, vasovagal 01/17/2018   Diabetes mellitus type II, non insulin dependent (HCC) 01/17/2018   Hyponatremia 01/17/2018   Syncope 01/17/2018   Depression  10/25/2015   Hyperlipidemia 10/25/2015   TINEA PEDIS 01/04/2010   TINEA VERSICOLOR 01/04/2010   URI 01/04/2010   HYPERGLYCEMIA 12/02/2008   ABDOMINAL WALL HERNIA 12/01/2007   OBESITY, MORBID 04/04/2007   SLEEP APNEA, OBSTRUCTIVE 04/04/2007   LOW BACK PAIN, CHRONIC 12/07/2004   HYPERLIPIDEMIA 08/12/2002   ALLERGIC RHINITIS, SEASONAL 08/12/2002   Essential hypertension 04/01/2000   Asthma 10/11/1997   SUPERFICIAL PHLEBITIS 05/06/1990    Orientation RESPIRATION BLADDER Height & Weight     Self, Time, Situation, Place  Normal Incontinent, Indwelling catheter Weight: (!) 343 lb 7.6 oz (155.8 kg) Height:  6' (182.9 cm)  BEHAVIORAL SYMPTOMS/MOOD NEUROLOGICAL BOWEL NUTRITION STATUS      Incontinent Diet (See DC Summary)  AMBULATORY STATUS COMMUNICATION OF NEEDS Skin   Extensive Assist Verbally Other (Comment) (MASD on buttocks)                       Personal Care Assistance Level of Assistance  Bathing, Feeding, Dressing Bathing Assistance: Maximum assistance Feeding assistance: Limited assistance Dressing Assistance: Maximum assistance     Functional Limitations Info  Hearing   Hearing Info: Impaired      SPECIAL CARE FACTORS FREQUENCY       Contractures Contractures Info: Not present    Additional Factors Info  Code Status, Allergies, Psychotropic, Insulin Sliding Scale Code Status Info: Full Allergies Info: NKA Psychotropic Info: Seroquel Insulin Sliding Scale Info: see dc summary       Current Medications (08/27/2022):  This is the current hospital active medication list Current Facility-Administered Medications  Medication Dose Route Frequency Provider Last Rate Last  Admin   0.9 %  sodium chloride infusion  250 mL Intravenous Continuous Duayne Cal, NP   Stopped at 08/25/22 0000   acetaminophen (TYLENOL) tablet 650 mg  650 mg Oral Q6H PRN Conrad Metamora, MD   650 mg at 08/26/22 2114   amoxicillin-clavulanate (AUGMENTIN) 500-125 MG per tablet 1 tablet   1 tablet Oral Q8H Cheri Fowler, MD   1 tablet at 08/27/22 4098   carvedilol (COREG) tablet 3.125 mg  3.125 mg Oral BID WC Leroy Sea, MD   3.125 mg at 08/27/22 1191   Chlorhexidine Gluconate Cloth 2 % PADS 6 each  6 each Topical Daily Martina Sinner, MD   6 each at 08/27/22 1123   docusate sodium (COLACE) capsule 100 mg  100 mg Oral BID PRN Martina Sinner, MD       Gerhardt's butt cream   Topical BID Janeann Forehand D, NP   Given by Other at 08/27/22 1123   heparin injection 5,000 Units  5,000 Units Subcutaneous Q8H Melody Comas B, MD   5,000 Units at 08/27/22 4782   insulin aspart (novoLOG) injection 0-9 Units  0-9 Units Subcutaneous TID WC Cheri Fowler, MD   1 Units at 08/27/22 1236   ipratropium-albuterol (DUONEB) 0.5-2.5 (3) MG/3ML nebulizer solution 3 mL  3 mL Nebulization Q4H PRN Opyd, Lavone Neri, MD   3 mL at 08/27/22 1235   mupirocin ointment (BACTROBAN) 2 % 1 Application  1 Application Nasal BID Martina Sinner, MD   1 Application at 08/27/22 1123   Oral care mouth rinse  15 mL Mouth Rinse PRN Martina Sinner, MD       pantoprazole (PROTONIX) EC tablet 40 mg  40 mg Oral BID Lanier Clam, NP   40 mg at 08/27/22 0838   polyethylene glycol (MIRALAX / GLYCOLAX) packet 17 g  17 g Oral Daily PRN Martina Sinner, MD       QUEtiapine (SEROQUEL) tablet 25 mg  25 mg Oral QHS Leroy Sea, MD   25 mg at 08/26/22 1705   tamsulosin (FLOMAX) capsule 0.4 mg  0.4 mg Oral Daily Leroy Sea, MD   0.4 mg at 08/27/22 9562     Discharge Medications: Please see discharge summary for a list of discharge medications.  Relevant Imaging Results:  Relevant Lab Results:   Additional Information SSN [id number]  Mearl Latin, LCSW

## 2022-08-27 NOTE — Progress Notes (Signed)
Bipap on standby. Being used as prn.

## 2022-08-27 NOTE — Progress Notes (Signed)
PROGRESS NOTE                                                                                                                                                                                                             Patient Demographics:    Brian Silva, is a 82 y.o. male, DOB - 11/13/1940, ZOX:[id number]  Outpatient Primary MD for the patient is Merrilyn Puma, DO    LOS - 5  Admit date - 08/22/2022    Chief Complaint  Patient presents with   Rectal Bleeding       Brief Narrative (HPI from H&P)   82 year old male with PMH as below, which is significant for, chronic respiratory failure on 2L, OSA not on CPAP, chronic incarcerated umbilical hernia, wheelchair bound, and HTN. He resides in [facility]. Presented to [facility] ED via EMS 5/1 with complaints of rectal bleeding x 2 episodes. Also had one episode of non-bloody emesis the night prior and abdominal pain for approximately 4 days.    He was diagnosed with sepsis workup suggestive of proctocolitis on CT scan, he was admitted in ICU required IV fluids and vasopressors, he was there for 4 days now transition to oral Augmentin and transferred to hospitalist service under my care on 08/26/2022 on day 4 of his hospital stay.   Significant Hospital Events:   5/1 concern for evolving sepsis in the setting of proctocolitis 5/2 progressive hypotension prompting insertion of A-line, revealed stable hemodynamics.  Appears hypercapnic on exam, BiPAP ordered 5/3 weaning pressors and adv diet to clears  5/4 off levo, transfer out  5/5 transferred to [facility] service under my care.   Subjective:   Patient in bed, appears comfortable, denies any headache, no fever, no chest pain or pressure, no shortness of breath , no abdominal pain. No new focal weakness.   Assessment  & Plan :    Acute metabolic encephalopathy, improving  Likely due to sepsis improving, mild  encephalopathy which will be managed supportively, low-dose Seroquel, nighttime CPAP to continue currently not hypercapnic, minimize benzodiazepines and narcotics.    AoC hypoxic and hypercarbic resp failure OSA  -baseline 2L, nightly CPAP. P: -PRN and qHS CPAP  -IS, pulm hygiene, mobility     Septic shock 2/2 proctocolitis - with sepsis at the time of admission, also had 2 episodes  of lower GI bleed at the time of admission, 1 out of 4 blood cultures growing staph hemolyticus is most likely contaminant, will trend with procalcitonin, CRP, repeat blood cultures, currently on oral Augmentin, will consult GI as he may require colonoscopy.  No signs of ongoing GI bleed.  His pathophysiology has resolved.   Chronic HFpEF Hx HTN  P: - BP improving low-dose Coreg.   AKI on CKD II -Good urine output, has Foley catheter placed and ICU, renal function trending towards improvement.  Continue to monitor.   Bladder outflow obstruction.  Underwent Foley catheter placement at the time of admission in ICU, Flomax added on 08/27/2022, trial of Foley catheter removal on 08/28/2022.  Foley catheter ordered by the ICU team.  Possible GIB  -there was report of BRBPR but difficult to corroborate. Not ongoing at this point.  P: -cont PPI BID -PRN CBC  -defer GI consult for now   Anemia -- acute on chronic -iatrogenic lab draws, 2 episodes of lower GI bleed.  Monitor.   Chronic pain P: -holding home meds w encephalopathy    Physical debility, deconditioning P: -PT/OT         Condition - Extremely Guarded  Family Communication  :  None present  Code Status :  Full  Consults  :  PCCM  PUD Prophylaxis :  PPI   Procedures  :     Lower extremity venous duplex bilateral.  No DVT.  Echocardiogram.  1. Left ventricular ejection fraction, by estimation, is 60 to 65%. The left ventricle has normal function. The left ventricle has no regional wall motion abnormalities. Left ventricular diastolic  parameters are consistent with Grade I diastolic dysfunction (impaired relaxation).  2. Right ventricular systolic function is normal. The right ventricular size is normal.  3. The mitral valve is normal in structure. No evidence of mitral valve regurgitation. No evidence of mitral stenosis.  4. The aortic valve has an indeterminant number of cusps. Aortic valve regurgitation is not visualized. No aortic stenosis is present.  5. Pulmonic valve regurgitation not well visualized.  6. The inferior vena cava is dilated in size with <50% respiratory variability, suggesting right atrial pressure of 15 mmHg.   CT abdomen pelvis.  Proctocolitis.      Disposition Plan  :    Status is: Inpatient   DVT Prophylaxis  :    heparin injection 5,000 Units Start: 08/22/22 2200    Lab Results  Component Value Date   PLT 293 08/27/2022    Diet :  Diet Order             Diet clear liquid Room service appropriate? Yes; Fluid consistency: Thin  Diet effective now                    Inpatient Medications  Scheduled Meds:  amoxicillin-clavulanate  1 tablet Oral Q8H   carvedilol  3.125 mg Oral BID WC   Chlorhexidine Gluconate Cloth  6 each Topical Daily   Gerhardt's butt cream   Topical BID   heparin  5,000 Units Subcutaneous Q8H   insulin aspart  0-9 Units Subcutaneous TID WC   mupirocin ointment  1 Application Nasal BID   pantoprazole  40 mg Oral BID   potassium chloride  40 mEq Oral Once   QUEtiapine  25 mg Oral QHS   tamsulosin  0.4 mg Oral Daily   Continuous Infusions:  sodium chloride Stopped (08/25/22 0000)   PRN Meds:.acetaminophen, docusate sodium, ipratropium-albuterol, mouth  rinse, polyethylene glycol      Objective:   Vitals:   08/26/22 2000 08/27/22 0000 08/27/22 0400 08/27/22 0757  BP: 127/61 132/72 (!) 168/64 (!) 160/68  Pulse: 64 63 65 70  Resp: 18 16 20 15   Temp: 99 F (37.2 C) 98.7 F (37.1 C) 98.1 F (36.7 C) 98.9 F (37.2 C)  TempSrc: Oral Oral Oral Oral   SpO2: 93% 97% 95% 98%  Weight:      Height:        Wt Readings from Last 3 Encounters:  08/26/22 (!) 155.8 kg  10/22/21 (!) 167.8 kg  09/10/21 (!) 165.1 kg     Intake/Output Summary (Last 24 hours) at 08/27/2022 4098 Last data filed at 08/27/2022 0400 Gross per 24 hour  Intake 1200 ml  Output 2700 ml  Net -1500 ml     Physical Exam  Awake , not confused oriented x 3, No new F.N deficits, foley [place].AT,PERRAL Supple Neck, No JVD,   Symmetrical Chest wall movement, Good air movement bilaterally, CTAB RRR,No Gallops,Rubs or new Murmurs,  +ve B.Sounds, Abd Soft, No tenderness,   No Cyanosis, Clubbing or edema       Data Review:    Recent Labs  Lab 08/22/22 1708 08/22/22 2144 08/23/22 0615 08/24/22 1191 08/25/22 0038 08/26/22 0628 08/27/22 0248  WBC 16.7*  --  17.4* 11.1* 7.8 9.3 7.0  HGB 10.4*   < > 10.1* 11.0* 8.8* 9.4* 9.4*  HCT 34.1*   < > 33.3* 35.8* 28.5* 29.1* 28.7*  PLT 287  --  302 PLATELET CLUMPS NOTED ON SMEAR, UNABLE TO ESTIMATE 206 283 293  MCV 93.4  --  92.8 91.8 91.3 89.3 87.0  MCH 28.5  --  28.1 28.2 28.2 28.8 28.5  MCHC 30.5  --  30.3 30.7 30.9 32.3 32.8  RDW 14.4  --  14.4 14.4 13.9 13.4 13.5  LYMPHSABS 1.1  --  2.3  --   --   --  1.3  MONOABS 1.1*  --  1.3*  --   --   --  0.7  EOSABS 0.0  --  0.2  --   --   --  0.4  BASOSABS 0.1  --  0.1  --   --   --  0.1   < > = values in this interval not displayed.    Recent Labs  Lab 08/22/22 1708 08/22/22 1809 08/22/22 2144 08/23/22 0615 08/23/22 4782 08/23/22 0932 08/23/22 1554 08/23/22 2155 08/24/22 9562 08/25/22 0038 08/26/22 0628 08/27/22 0248  NA 136  --    < > 134*  --   --    < > 135 135 136 140 140  K 5.5*  --    < > 5.2*  --   --    < > 4.9 5.5* 4.4 3.8 3.5  CL 102  --   --  101  --   --    < > 101 103 104 104 107  CO2 23  --   --  21*  --   --    < > 24 22 24 23 24   ANIONGAP 11  --   --  12  --   --    < > 10 10 8 13 9   GLUCOSE 138*  --   --  133*  --   --    < > 109* 101* 101*  127* 116*  BUN 91*  --   --  82*  --   --    < >  79* 75* 65* 34* 19  CREATININE 3.51*  --   --  3.33*  --   --    < > 3.09* 3.05* 2.44* 1.64* 1.40*  AST 17  --   --   --   --   --   --   --  20 16  --   --   ALT 16  --   --   --   --   --   --   --  18 15  --   --   ALKPHOS 67  --   --   --   --   --   --   --  58 56  --   --   BILITOT 0.4  --   --   --   --   --   --   --  0.6 0.8  --   --   ALBUMIN 2.7*  --   --   --   --   --   --   --  2.7* 2.5*  --   --   CRP  --   --   --   --   --   --   --   --   --   --  13.7* 9.7*  PROCALCITON  --   --   --   --   --   --   --   --   --   --  0.17 0.11  LATICACIDVEN  --  1.2  --   --  1.1  --   --   --   --   --   --   --   INR  --   --   --   --   --  1.2  --   --   --   --  1.2  --   HGBA1C  --   --   --  6.2*  --   --   --   --   --   --   --   --   AMMONIA  --   --   --   --   --   --   --   --   --   --  46*  --   BNP 31.5  --   --   --   --   --   --   --   --   --  345.3* 55.6  MG  --   --   --   --   --   --   --   --   --   --  2.0 2.0  CALCIUM 8.5*  --   --  8.4*  --   --    < > 8.5* 8.7* 8.5* 9.2 8.9   < > = values in this interval not displayed.      Recent Labs  Lab 08/22/22 1708 08/22/22 1809 08/23/22 0615 08/23/22 1610 08/23/22 0932 08/23/22 1554 08/23/22 2155 08/24/22 9604 08/25/22 0038 08/26/22 0628 08/27/22 0248  CRP  --   --   --   --   --   --   --   --   --  13.7* 9.7*  PROCALCITON  --   --   --   --   --   --   --   --   --  0.17 0.11  LATICACIDVEN  --  1.2  --  1.1  --   --   --   --   --   --   --   INR  --   --   --   --  1.2  --   --   --   --  1.2  --   HGBA1C  --   --  6.2*  --   --   --   --   --   --   --   --   AMMONIA  --   --   --   --   --   --   --   --   --  46*  --   BNP 31.5  --   --   --   --   --   --   --   --  345.3* 55.6  MG  --   --   --   --   --   --   --   --   --  2.0 2.0  CALCIUM 8.5*  --  8.4*  --   --    < > 8.5* 8.7* 8.5* 9.2 8.9   < > = values in this interval not displayed.     Lab Results  Component Value Date   HGBA1C 6.2 (H) 08/23/2022    No results for input(s): "TSH", "T4TOTAL", "T3FREE", "THYROIDAB" in the last 72 hours.  Invalid input(s): "FREET3" ------------------------------------------------------------------------------------------------------------------ Cardiac Enzymes No results for input(s): "CKMB", "TROPONINI", "MYOGLOBIN" in the last 168 hours.  Invalid input(s): "CK"  Radiology Reports DG Chest Port 1 View  Result Date: 08/26/2022 CLINICAL DATA:  82 year old male with history of shortness of breath. EXAM: PORTABLE CHEST 1 VIEW COMPARISON:  Chest x-ray 08/22/2022. FINDINGS: Lung volumes are low, and image is under penetrated limiting the diagnostic sensitivity and specificity of today's examination. With these limitations in mind, there is no definite consolidative airspace disease. No pleural effusions. No pneumothorax. No pulmonary nodule or mass noted. Pulmonary vasculature and the cardiomediastinal silhouette are within normal limits. Atherosclerosis in the thoracic aorta. IMPRESSION: 1. Low lung volumes without radiographic evidence of acute cardiopulmonary disease. 2. Aortic atherosclerosis. Electronically Signed   By: Trudie Reed M.D.   On: 08/26/2022 06:53   VAS [name] LOWER EXTREMITY VENOUS (DVT)  Result Date: 08/25/2022  Lower Venous DVT Study Patient Name:  OSBURN BABIAK  Date of Exam:   08/24/2022 Medical Rec #: [id number]         Accession #:    [id number] Date of Birth: 1940-05-26         Patient Gender: M Patient Age:   62 years Exam Location:  [facility] Procedure:      VAS [name] LOWER EXTREMITY VENOUS (DVT) Referring Phys: Felisa Bonier --------------------------------------------------------------------------------  Indications: Edema.  Limitations: Body habitus. Comparison Study: No previous study. Performing Technologist: McKayla Maag RVT, VT  Examination Guidelines: A complete evaluation includes B-mode imaging,  spectral Doppler, color Doppler, and power Doppler as needed of all accessible portions of each vessel. Bilateral testing is considered an integral part of a complete examination. Limited examinations for reoccurring indications may be performed as noted. The reflux portion of the exam is performed with the patient in reverse Trendelenburg.  +---------+---------------+---------+-----------+----------+-------------------+ RIGHT    CompressibilityPhasicitySpontaneityPropertiesThrombus Aging      +---------+---------------+---------+-----------+----------+-------------------+ CFV      Full           Yes      Yes                                      +---------+---------------+---------+-----------+----------+-------------------+  SFJ      Full                                                             +---------+---------------+---------+-----------+----------+-------------------+ FV Prox  Full                                                             +---------+---------------+---------+-----------+----------+-------------------+ FV Mid   Full                                                             +---------+---------------+---------+-----------+----------+-------------------+ FV DistalFull                                                             +---------+---------------+---------+-----------+----------+-------------------+ PFV      Full                                                             +---------+---------------+---------+-----------+----------+-------------------+ POP      Full           Yes      Yes                                      +---------+---------------+---------+-----------+----------+-------------------+ PTV      Full                                         Not well visualized +---------+---------------+---------+-----------+----------+-------------------+ PERO                                                  Not  visualized      +---------+---------------+---------+-----------+----------+-------------------+   +---------+---------------+---------+-----------+----------+-------------------+ LEFT     CompressibilityPhasicitySpontaneityPropertiesThrombus Aging      +---------+---------------+---------+-----------+----------+-------------------+ CFV      Full           Yes      Yes                                      +---------+---------------+---------+-----------+----------+-------------------+ SFJ      Full                                                             +---------+---------------+---------+-----------+----------+-------------------+  FV Prox  Full                                                             +---------+---------------+---------+-----------+----------+-------------------+ FV Mid   Full                                                             +---------+---------------+---------+-----------+----------+-------------------+ FV DistalFull                                                             +---------+---------------+---------+-----------+----------+-------------------+ PFV      Full                                                             +---------+---------------+---------+-----------+----------+-------------------+ POP      Full           Yes      Yes                                      +---------+---------------+---------+-----------+----------+-------------------+ PTV      Full                                         Not well visualized +---------+---------------+---------+-----------+----------+-------------------+ PERO                                                  Not visualized      +---------+---------------+---------+-----------+----------+-------------------+     Summary: RIGHT: - There is no evidence of deep vein thrombosis in the lower extremity. However, portions of this examination were limited- see  technologist comments above.  - No cystic structure found in the popliteal fossa.  LEFT: - There is no evidence of deep vein thrombosis in the lower extremity. However, portions of this examination were limited- see technologist comments above.  - No cystic structure found in the popliteal fossa.  *See table(s) above for measurements and observations. Electronically signed by Coral Else MD on 08/25/2022 at 3:00:51 PM.    Final    ECHOCARDIOGRAM COMPLETE  Result Date: 08/24/2022    ECHOCARDIOGRAM REPORT   Patient Name:   HUDSON ASCOLESE Date of Exam: 08/24/2022 Medical Rec #:  [id number]        Height:       72.0 in Accession #:    [id number]       Weight:  351.2 lb Date of Birth:  1940-10-02        BSA:          2.707 m Patient Age:    83 years         BP:           87/64 mmHg Patient Gender: M                HR:           67 bpm. Exam Location:  Inpatient Procedure: 2D Echo, Cardiac Doppler, Color Doppler and Intracardiac            Opacification Agent Indications:    Shock R57.9  History:        Patient has prior history of Echocardiogram examinations, most                 recent 07/09/2020. CHF, Signs/Symptoms:Syncope; Risk                 Factors:Hypertension, Sleep Apnea, Diabetes and Dyslipidemia.  Sonographer:    Lucendia Herrlich Referring Phys: [id number] Ivor Costa MEIER  Sonographer Comments: Technically difficult study due to poor echo windows, suboptimal parasternal window, suboptimal apical window, suboptimal subcostal window and patient is obese. IMPRESSIONS  1. Left ventricular ejection fraction, by estimation, is 60 to 65%. The left ventricle has normal function. The left ventricle has no regional wall motion abnormalities. Left ventricular diastolic parameters are consistent with Grade I diastolic dysfunction (impaired relaxation).  2. Right ventricular systolic function is normal. The right ventricular size is normal.  3. The mitral valve is normal in structure. No evidence of mitral valve  regurgitation. No evidence of mitral stenosis.  4. The aortic valve has an indeterminant number of cusps. Aortic valve regurgitation is not visualized. No aortic stenosis is present.  5. Pulmonic valve regurgitation not well visualized.  6. The inferior vena cava is dilated in size with <50% respiratory variability, suggesting right atrial pressure of 15 mmHg. FINDINGS  Left Ventricle: Left ventricular ejection fraction, by estimation, is 60 to 65%. The left ventricle has normal function. The left ventricle has no regional wall motion abnormalities. Definity contrast agent was given IV to delineate the left ventricular  endocardial borders. The left ventricular internal cavity size was normal in size. There is no left ventricular hypertrophy. Left ventricular diastolic function could not be evaluated due to atrial fibrillation. Left ventricular diastolic parameters are  consistent with Grade I diastolic dysfunction (impaired relaxation). Right Ventricle: The right ventricular size is normal. Right ventricular systolic function is normal. Left Atrium: Left atrial size was normal in size. Right Atrium: Right atrial size was normal in size. Pericardium: Trivial pericardial effusion is present. Mitral Valve: The mitral valve is normal in structure. No evidence of mitral valve regurgitation. No evidence of mitral valve stenosis. Tricuspid Valve: The tricuspid valve is normal in structure. Tricuspid valve regurgitation is not demonstrated. No evidence of tricuspid stenosis. Aortic Valve: The aortic valve has an indeterminant number of cusps. Aortic valve regurgitation is not visualized. No aortic stenosis is present. Aortic valve peak gradient measures 11.4 mmHg. Pulmonic Valve: The pulmonic valve was not well visualized. Pulmonic valve regurgitation not well visualized. Aorta: The aortic root is normal in size and structure. Venous: The inferior vena cava is dilated in size with less than 50% respiratory variability,  suggesting right atrial pressure of 15 mmHg. IAS/Shunts: No atrial level shunt detected by color flow Doppler.  LEFT VENTRICLE PLAX 2D LVIDd:  4.20 cm   Diastology LVIDs:         2.70 cm   LV e' medial:    6.84 cm/s LV PW:         1.60 cm   LV E/e' medial:  13.8 LV IVS:        1.10 cm   LV e' lateral:   9.32 cm/s LVOT diam:     2.40 cm   LV E/e' lateral: 10.1 LV SV:         81 LV SV Index:   30 LVOT Area:     4.52 cm  RIGHT VENTRICLE             IVC RV S prime:     17.10 cm/s  IVC diam: 2.20 cm TAPSE (M-mode): 1.5 cm AORTIC VALVE AV Area (Vmax): 2.56 cm AV Vmax:        169.00 cm/s AV Peak Grad:   11.4 mmHg LVOT Vmax:      95.72 cm/s LVOT Vmean:     63.140 cm/s LVOT VTI:       0.180 m  AORTA Ao Root diam: 3.50 cm Ao Asc diam:  3.20 cm MITRAL VALVE MV Area (PHT): 3.91 cm     SHUNTS MV Decel Time: 194 msec     Systemic VTI:  0.18 m MV E velocity: 94.30 cm/s   Systemic Diam: 2.40 cm MV A velocity: 102.00 cm/s MV E/A ratio:  0.92 Olga Millers MD Electronically signed by Olga Millers MD Signature Date/Time: 08/24/2022/3:04:02 PM    Final       Signature  -   Susa Raring M.D on 08/27/2022 at 8:22 AM   -  To page go to [email or web address]

## 2022-08-27 NOTE — Care Management Important Message (Signed)
Important Message  Patient Details  Name: Brian Silva MRN: [id number] Date of Birth: 01-10-1941   Medicare Important Message Given:  Yes     Dorena Bodo 08/27/2022, 4:03 PM

## 2022-08-28 DIAGNOSIS — R933 Abnormal findings on diagnostic imaging of other parts of digestive tract: Secondary | ICD-10-CM

## 2022-08-28 DIAGNOSIS — A419 Sepsis, unspecified organism: Secondary | ICD-10-CM | POA: Diagnosis not present

## 2022-08-28 DIAGNOSIS — D649 Anemia, unspecified: Secondary | ICD-10-CM | POA: Diagnosis not present

## 2022-08-28 DIAGNOSIS — G9341 Metabolic encephalopathy: Secondary | ICD-10-CM | POA: Diagnosis not present

## 2022-08-28 DIAGNOSIS — R195 Other fecal abnormalities: Secondary | ICD-10-CM | POA: Diagnosis not present

## 2022-08-28 DIAGNOSIS — R6521 Severe sepsis with septic shock: Secondary | ICD-10-CM | POA: Diagnosis not present

## 2022-08-28 LAB — BASIC METABOLIC PANEL
Anion gap: 9 (ref 5–15)
BUN: 13 mg/dL (ref 8–23)
CO2: 23 mmol/L (ref 22–32)
Calcium: 8.8 mg/dL — ABNORMAL LOW (ref 8.9–10.3)
Chloride: 106 mmol/L (ref 98–111)
Creatinine, Ser: 1.41 mg/dL — ABNORMAL HIGH (ref 0.61–1.24)
GFR, Estimated: 51 mL/min — ABNORMAL LOW (ref >60)
Glucose, Bld: 92 mg/dL (ref 70–99)
Potassium: 3.6 mmol/L (ref 3.5–5.1)
Sodium: 138 mmol/L (ref 135–145)

## 2022-08-28 LAB — MAGNESIUM: Magnesium: 2 mg/dL (ref 1.7–2.4)

## 2022-08-28 LAB — CBC WITH DIFFERENTIAL/PLATELET
Abs Immature Granulocytes: 0.07 10*3/uL (ref 0.00–0.07)
Basophils Absolute: 0.1 10*3/uL (ref 0.0–0.1)
Basophils Relative: 1 %
Eosinophils Absolute: 0.4 10*3/uL (ref 0.0–0.5)
Eosinophils Relative: 4 %
HCT: 29.4 % — ABNORMAL LOW (ref 39.0–52.0)
Hemoglobin: 9.7 g/dL — ABNORMAL LOW (ref 13.0–17.0)
Immature Granulocytes: 1 %
Lymphocytes Relative: 21 %
Lymphs Abs: 1.9 10*3/uL (ref 0.7–4.0)
MCH: 28.4 pg (ref 26.0–34.0)
MCHC: 33 g/dL (ref 30.0–36.0)
MCV: 86.2 fL (ref 80.0–100.0)
Monocytes Absolute: 0.9 10*3/uL (ref 0.1–1.0)
Monocytes Relative: 10 %
Neutro Abs: 5.9 10*3/uL (ref 1.7–7.7)
Neutrophils Relative %: 63 %
Platelets: 299 10*3/uL (ref 150–400)
RBC: 3.41 MIL/uL — ABNORMAL LOW (ref 4.22–5.81)
RDW: 13.7 % (ref 11.5–15.5)
WBC: 9.3 10*3/uL (ref 4.0–10.5)
nRBC: 0 % (ref 0.0–0.2)

## 2022-08-28 LAB — C-REACTIVE PROTEIN: CRP: 6 mg/dL — ABNORMAL HIGH (ref <1.0)

## 2022-08-28 LAB — GLUCOSE, CAPILLARY
Glucose-Capillary: 92 mg/dL (ref 70–99)
Glucose-Capillary: 96 mg/dL (ref 70–99)
Glucose-Capillary: 108 mg/dL — ABNORMAL HIGH (ref 70–99)
Glucose-Capillary: 116 mg/dL — ABNORMAL HIGH (ref 70–99)

## 2022-08-28 LAB — CULTURE, BLOOD (ROUTINE X 2)
Culture: NO GROWTH
Culture: NO GROWTH
Special Requests: ADEQUATE

## 2022-08-28 LAB — BRAIN NATRIURETIC PEPTIDE: B Natriuretic Peptide: 39.5 pg/mL (ref 0.0–100.0)

## 2022-08-28 LAB — PROCALCITONIN: Procalcitonin: <0.1 ng/mL

## 2022-08-28 MED ORDER — PEG-KCL-NACL-NASULF-NA ASC-C 100 G PO SOLR
0.5000 | Freq: Once | ORAL | Status: AC
  Administered 2022-08-28: 100 g via ORAL
  Filled 2022-08-28: qty 1

## 2022-08-28 MED ORDER — PEG-KCL-NACL-NASULF-NA ASC-C 100 G PO SOLR: 1.0000 | Freq: Once | ORAL | Status: DC

## 2022-08-28 MED ORDER — PEG-KCL-NACL-NASULF-NA ASC-C 100 G PO SOLR
0.5000 | Freq: Once | ORAL | Status: AC
  Administered 2022-08-29: 100 g via ORAL

## 2022-08-28 NOTE — TOC Progression Note (Signed)
Transition of Care [facility]) - Progression Note    Patient Details  Name: Brian Silva MRN: [id number] Date of Birth: [birth year]  Transition of Care [facility]) CM/SW Contact  Mearl Latin, LCSW Phone Number: 08/28/2022, 2:53 PM  Clinical Narrative:    CSW provided update to [facility] on potential discharge early next week.    Expected Discharge Plan: Skilled Nursing Facility Barriers to Discharge: Continued Medical Work up  Expected Discharge Plan and Services In-house Referral: Clinical Social Work   Post Acute Care Choice: Skilled Nursing Facility Living arrangements for the past 2 months: Skilled Nursing Facility                                       Social Determinants of Health (SDOH) Interventions SDOH Screenings   Food Insecurity: Unknown (01/29/2019)  Transportation Needs: Unknown (01/29/2019)  Financial Resource Strain: Low Risk  (01/29/2019)  Physical Activity: Unknown (01/29/2019)  Social Connections: Unknown (01/29/2019)  Stress: No Stress Concern Present (01/29/2019)  Tobacco Use: Low Risk  (10/22/2021)    Readmission Risk Interventions     No data to display

## 2022-08-28 NOTE — Progress Notes (Addendum)
  Progress Note  Primary GI: unassigned  LOS: 6 days   Chief Complaint:Normocytic anemia, heme positive stool, distal sigmoid/rectal wall thickening on CT    Subjective   No family was present at the time of my evaluation.  Patient denies nausea, vomiting, abdominal pain. Unsure if he has had a bowel movements, though states he denies rectal bleeding. Patient is agreeable to colonoscopy   Objective   Vital signs in last 24 hours: Temp:  [98 F (36.7 C)-99.2 F (37.3 C)] 98 F (36.7 C) (05/07 1120) Pulse Rate:  [62-74] 65 (05/07 1130) Resp:  [13-24] 21 (05/07 1130) BP: (123-158)/(45-59) 124/52 (05/07 1120) SpO2:  [93 %-98 %] 94 % (05/07 1130) Weight:  [150.5 kg] 150.5 kg (05/07 0400) Last BM Date : 08/27/22 Last BM recorded by nurses in past 5 days Stool Type: Type 7 (Liquid consistency with no solid pieces) (08/27/2022 12:00 AM)  General:   male in no acute distress  Heart:  Regular rate and rhythm; no murmurs Pulm: Clear anteriorly; no wheezing Abdomen: soft, nondistended, normal bowel sounds in all quadrants. Nontender without guarding. No organomegaly appreciated. Extremities:  No edema Neurologic:  Alert and  oriented x4;  No focal deficits.  Psych:  Cooperative. Normal mood and affect.  Intake/Output from previous day: 05/06 0701 - 05/07 0700 In: -  Out: 1225 [Urine:1225] Intake/Output this shift: Total I/O In: -  Out: 500 [Urine:500]  Studies/Results: No results found.  Lab Results: Recent Labs    08/26/22 0628 08/27/22 0248 08/28/22 0442  WBC 9.3 7.0 9.3  HGB 9.4* 9.4* 9.7*  HCT 29.1* 28.7* 29.4*  PLT 283 293 299   BMET Recent Labs    08/26/22 0628 08/27/22 0248 08/28/22 0442  NA 140 140 138  K 3.8 3.5 3.6  CL 104 107 106  CO2 23 24 23  GLUCOSE 127* 116* 92  BUN 34* 19 13  CREATININE 1.64* 1.40* 1.41*  CALCIUM 9.2 8.9 8.8*   LFT No results for input(s): "PROT", "ALBUMIN", "AST", "ALT", "ALKPHOS", "BILITOT", "BILIDIR", "IBILI" in the  last 72 hours. PT/INR Recent Labs    08/26/22 0628  LABPROT 14.7  INR 1.2     Scheduled Meds:  amoxicillin-clavulanate  1 tablet Oral Q8H   carvedilol  3.125 mg Oral BID WC   Chlorhexidine Gluconate Cloth  6 each Topical Daily   Gerhardt's butt cream   Topical BID   heparin  5,000 Units Subcutaneous Q8H   insulin aspart  0-9 Units Subcutaneous TID WC   pantoprazole  40 mg Oral BID   QUEtiapine  25 mg Oral QHS   tamsulosin  0.4 mg Oral Daily   Continuous Infusions:  sodium chloride Stopped (08/25/22 0000)     Patient impression   82 year old male with PMH as below, which is significant for, chronic respiratory failure on 2L, OSA not on CPAP, chronic incarcerated umbilical hernia, wheelchair bound, and HTN. He resides in [facility]. Presented to [facility] via EMS 5/1 with complaints of rectal bleeding x 2 episodes. Also had one episode of non-bloody emesis the night prior and abdominal pain for approximately 4 days.     He was diagnosed with sepsis workup suggestive of proctocolitis on CT scan, he was admitted in ICU required IV fluids and vasopressors, he was there for 4 days now transition to oral Augmentin and transferred to hospitalist service on 08/26/2022.    Impression:   Normocytic anemia acute on chronic, heme positive stool Distal   sigmoid/rectal wall thickening on CT  - FOBT positive - AKI on admission, now improving  - INR 1.2 -Iron 42, TIBC 209, saturation 20% (improved from 2 years ago) -Ferritin 403, likely acute phase reactant - hgb 9.7, stable Patient's vitals have improved since ICU and is no longer on pressors.  No complaints at this time. Hgb improving. Plan for colonoscopy due to abnormal CT and previous rectal bleeding on admission. Normocytic anemia with no overt bleeding at this time. Iron studies are okay. Unsure if EGD would be beneficial with improving hgb, stable iron studies, and no previous GERD history. Though could possibly consider  adding on, will discuss with Dr. Pyrtle.   Sepsis Metabolic encephalopathy-improved    Plan:   Plan for Colonoscopy tomorrow  I thoroughly discussed the procedures to include nature, alternatives, benefits, and risks including but not limited to bleeding, perforation, infection, anesthesia/cardiac and pulmonary complications. Patient provides understanding and gave verbal consent to proceed. Continue Protonix 40 mg BID. Moviprep, clear liquid diet, NPO at midnight. Continue daily CBC with transfusion as needed to maintain Hgb >7.     Debera Sterba M Maimouna Rondeau  08/28/2022, 11:41 AM    

## 2022-08-28 NOTE — H&P (View-Only) (Signed)
Progress Note  Primary GI: unassigned  LOS: 6 days   Chief Complaint:Normocytic anemia, heme positive stool, distal sigmoid/rectal wall thickening on CT    Subjective   No family was present at the time of my evaluation.  Patient denies nausea, vomiting, abdominal pain. Unsure if he has had a bowel movements, though states he denies rectal bleeding. Patient is agreeable to colonoscopy   Objective   Vital signs in last 24 hours: Temp:  [98 F (36.7 C)-99.2 F (37.3 C)] 98 F (36.7 C) (05/07 1120) Pulse Rate:  [62-74] 65 (05/07 1130) Resp:  [13-24] 21 (05/07 1130) BP: (123-158)/(45-59) 124/52 (05/07 1120) SpO2:  [93 %-98 %] 94 % (05/07 1130) Weight:  [150.5 kg] 150.5 kg (05/07 0400) Last BM Date : 08/27/22 Last BM recorded by nurses in past 5 days Stool Type: Type 7 (Liquid consistency with no solid pieces) (08/27/2022 12:00 AM)  General:   male in no acute distress  Heart:  Regular rate and rhythm; no murmurs Pulm: Clear anteriorly; no wheezing Abdomen: soft, nondistended, normal bowel sounds in all quadrants. Nontender without guarding. No organomegaly appreciated. Extremities:  No edema Neurologic:  Alert and  oriented x4;  No focal deficits.  Psych:  Cooperative. Normal mood and affect.  Intake/Output from previous day: 05/06 0701 - 05/07 0700 In: -  Out: 1225 [Urine:1225] Intake/Output this shift: Total I/O In: -  Out: 500 [Urine:500]  Studies/Results: No results found.  Lab Results: Recent Labs    08/26/22 0628 08/27/22 0248 08/28/22 0442  WBC 9.3 7.0 9.3  HGB 9.4* 9.4* 9.7*  HCT 29.1* 28.7* 29.4*  PLT 283 293 299   BMET Recent Labs    08/26/22 0628 08/27/22 0248 08/28/22 0442  NA 140 140 138  K 3.8 3.5 3.6  CL 104 107 106  CO2 23 24 23   GLUCOSE 127* 116* 92  BUN 34* 19 13  CREATININE 1.64* 1.40* 1.41*  CALCIUM 9.2 8.9 8.8*   LFT No results for input(s): "PROT", "ALBUMIN", "AST", "ALT", "ALKPHOS", "BILITOT", "BILIDIR", "IBILI" in the  last 72 hours. PT/INR Recent Labs    08/26/22 0628  LABPROT 14.7  INR 1.2     Scheduled Meds:  amoxicillin-clavulanate  1 tablet Oral Q8H   carvedilol  3.125 mg Oral BID WC   Chlorhexidine Gluconate Cloth  6 each Topical Daily   Gerhardt's butt cream   Topical BID   heparin  5,000 Units Subcutaneous Q8H   insulin aspart  0-9 Units Subcutaneous TID WC   pantoprazole  40 mg Oral BID   QUEtiapine  25 mg Oral QHS   tamsulosin  0.4 mg Oral Daily   Continuous Infusions:  sodium chloride Stopped (08/25/22 0000)     Patient impression   82 year old male with PMH as below, which is significant for, chronic respiratory failure on 2L, OSA not on CPAP, chronic incarcerated umbilical hernia, wheelchair bound, and HTN. He resides in [facility]. Presented to [facility] ED via EMS 5/1 with complaints of rectal bleeding x 2 episodes. Also had one episode of non-bloody emesis the night prior and abdominal pain for approximately 4 days.     He was diagnosed with sepsis workup suggestive of proctocolitis on CT scan, he was admitted in ICU required IV fluids and vasopressors, he was there for 4 days now transition to oral Augmentin and transferred to hospitalist service on 08/26/2022.    Impression:   Normocytic anemia acute on chronic, heme positive stool Distal  sigmoid/rectal wall thickening on CT  - FOBT positive - AKI on admission, now improving  - INR 1.2 -Iron 42, TIBC 209, saturation 20% (improved from 2 years ago) -Ferritin 403, likely acute phase reactant - hgb 9.7, stable Patient's vitals have improved since ICU and is no longer on pressors.  No complaints at this time. Hgb improving. Plan for colonoscopy due to abnormal CT and previous rectal bleeding on admission. Normocytic anemia with no overt bleeding at this time. Iron studies are okay. Unsure if EGD would be beneficial with improving hgb, stable iron studies, and no previous GERD history. Though could possibly consider  adding on, will discuss with Dr. Rhea Belton.   Sepsis Metabolic encephalopathy-improved    Plan:   Plan for Colonoscopy tomorrow  I thoroughly discussed the procedures to include nature, alternatives, benefits, and risks including but not limited to bleeding, perforation, infection, anesthesia/cardiac and pulmonary complications. Patient provides understanding and gave verbal consent to proceed. Continue Protonix 40 mg BID. Moviprep, clear liquid diet, NPO at midnight. Continue daily CBC with transfusion as needed to maintain Hgb >7.     Jossalyn Forgione Leanna Sato  08/28/2022, 11:41 AM

## 2022-08-28 NOTE — Progress Notes (Signed)
PROGRESS NOTE                                                                                                                                                                                                             Patient Demographics:    Brian Silva, is a 82 y.o. male, DOB - 06-10-1940, ZOX:[id number]  Outpatient Primary MD for the patient is Merrilyn Puma, DO    LOS - 6  Admit date - 08/22/2022    Chief Complaint  Patient presents with   Rectal Bleeding       Brief Narrative (HPI from H&P)   82 year old male with PMH as below, which is significant for, chronic respiratory failure on 2L, OSA not on CPAP, chronic incarcerated umbilical hernia, wheelchair bound, and HTN. He resides in [facility]. Presented to [facility] ED via EMS 5/1 with complaints of rectal bleeding x 2 episodes. Also had one episode of non-bloody emesis the night prior and abdominal pain for approximately 4 days.    He was diagnosed with sepsis workup suggestive of proctocolitis on CT scan, he was admitted in ICU required IV fluids and vasopressors, he was there for 4 days now transition to oral Augmentin and transferred to hospitalist service under my care on 08/26/2022 on day 4 of his hospital stay.   Significant Hospital Events:   5/1 concern for evolving sepsis in the setting of proctocolitis 5/2 progressive hypotension prompting insertion of A-line, revealed stable hemodynamics.  Appears hypercapnic on exam, BiPAP ordered 5/3 weaning pressors and adv diet to clears  5/4 off levo, transfer out  5/5 transferred to [facility] service under my care.   Subjective:   Patient in bed, appears comfortable, denies any headache, no fever, no chest pain or pressure, no shortness of breath , no abdominal pain. No new focal weakness.   Assessment  & Plan :    Acute metabolic encephalopathy, improving -  Likely due to sepsis improving, mild  encephalopathy which will be managed supportively, low-dose Seroquel, nighttime CPAP to continue currently not hypercapnic, minimize benzodiazepines and narcotics.    AoC hypoxic and hypercarbic resp failure, OSA -  -baseline 2L, nightly CPAP but noncompliant with it despite counseling, continue nighttime oxygen which she prefers, encouraged to sit in chair use I-S and flutter valve for pulmonary toiletry in the daytime.  Septic shock 2/2 proctocolitis - with sepsis at the time of admission, also had 2 episodes of lower GI bleed at the time of admission, 1 out of 4 blood cultures growing staph hemolyticus is most likely contaminant, will trend with procalcitonin, CRP, repeat blood cultures, currently on oral Augmentin, GI following contemplating colonoscopy versus flex sig later this admission.  No signs of ongoing GI bleed.  Sepsis pathophysiology has resolved.   Chronic HFpEF Hx HTN  P: - BP improving low-dose Coreg.   AKI on CKD II -Good urine output, has Foley catheter placed and ICU, renal function trending towards improvement.  Continue to monitor.   Bladder outflow obstruction.  Underwent Foley catheter placement at the time of admission in ICU, Flomax added on 08/27/2022, trial of Foley catheter removal on 08/28/2022.  Foley catheter ordered by the ICU team.  Possible GIB  -there was report of BRBPR but difficult to corroborate. Not ongoing at this point.  P: -cont PPI BID -PRN CBC  -defer GI consult for now   Anemia -- acute on chronic -iatrogenic lab draws, 2 episodes of lower GI bleed.  Monitor.   Chronic pain P: -holding home meds w encephalopathy    Physical debility, deconditioning P: -PT/OT         Condition - Extremely Guarded  Family Communication  :  None present  Code Status :  Full  Consults  :  PCCM  PUD Prophylaxis :  PPI   Procedures  :     Lower extremity venous duplex bilateral.  No DVT.  Echocardiogram.  1. Left ventricular ejection fraction,  by estimation, is 60 to 65%. The left ventricle has normal function. The left ventricle has no regional wall motion abnormalities. Left ventricular diastolic parameters are consistent with Grade I diastolic dysfunction (impaired relaxation).  2. Right ventricular systolic function is normal. The right ventricular size is normal.  3. The mitral valve is normal in structure. No evidence of mitral valve regurgitation. No evidence of mitral stenosis.  4. The aortic valve has an indeterminant number of cusps. Aortic valve regurgitation is not visualized. No aortic stenosis is present.  5. Pulmonic valve regurgitation not well visualized.  6. The inferior vena cava is dilated in size with <50% respiratory variability, suggesting right atrial pressure of 15 mmHg.   CT abdomen pelvis.  Proctocolitis.      Disposition Plan  :    Status is: Inpatient   DVT Prophylaxis  :    heparin injection 5,000 Units Start: 08/22/22 2200    Lab Results  Component Value Date   PLT 299 08/28/2022    Diet :  Diet Order             Diet clear liquid Room service appropriate? Yes; Fluid consistency: Thin  Diet effective now                    Inpatient Medications  Scheduled Meds:  amoxicillin-clavulanate  1 tablet Oral Q8H   carvedilol  3.125 mg Oral BID WC   Chlorhexidine Gluconate Cloth  6 each Topical Daily   Gerhardt's butt cream   Topical BID   heparin  5,000 Units Subcutaneous Q8H   insulin aspart  0-9 Units Subcutaneous TID WC   pantoprazole  40 mg Oral BID   QUEtiapine  25 mg Oral QHS   tamsulosin  0.4 mg Oral Daily   Continuous Infusions:  sodium chloride Stopped (08/25/22 0000)   PRN Meds:.acetaminophen, docusate sodium, ipratropium-albuterol, mouth  rinse, polyethylene glycol      Objective:   Vitals:   08/28/22 0200 08/28/22 0400 08/28/22 0818 08/28/22 0834  BP:  (!) 133/52  (!) 123/56  Pulse: 71 65 68 70  Resp: 20 19 18 18   Temp:  98.1 F (36.7 C)  98.2 F (36.8 C)   TempSrc:  Oral  Oral  SpO2: 96% 94% 95% 94%  Weight:  (!) 150.5 kg    Height:        Wt Readings from Last 3 Encounters:  08/28/22 (!) 150.5 kg  10/22/21 (!) 167.8 kg  09/10/21 (!) 165.1 kg     Intake/Output Summary (Last 24 hours) at 08/28/2022 0908 Last data filed at 08/27/2022 1600 Gross per 24 hour  Intake --  Output 1225 ml  Net -1225 ml     Physical Exam  Awake , not confused oriented x 3, No new F.N deficits, foley [place].AT,PERRAL Supple Neck, No JVD,   Symmetrical Chest wall movement, Good air movement bilaterally, CTAB RRR,No Gallops,Rubs or new Murmurs,  +ve B.Sounds, Abd Soft, No tenderness,   No Cyanosis, Clubbing or edema       Data Review:    Recent Labs  Lab 08/22/22 1708 08/22/22 2144 08/23/22 0615 08/24/22 1610 08/25/22 0038 08/26/22 0628 08/27/22 0248 08/28/22 0442  WBC 16.7*  --  17.4* 11.1* 7.8 9.3 7.0 9.3  HGB 10.4*   < > 10.1* 11.0* 8.8* 9.4* 9.4* 9.7*  HCT 34.1*   < > 33.3* 35.8* 28.5* 29.1* 28.7* 29.4*  PLT 287  --  302 PLATELET CLUMPS NOTED ON SMEAR, UNABLE TO ESTIMATE 206 283 293 299  MCV 93.4  --  92.8 91.8 91.3 89.3 87.0 86.2  MCH 28.5  --  28.1 28.2 28.2 28.8 28.5 28.4  MCHC 30.5  --  30.3 30.7 30.9 32.3 32.8 33.0  RDW 14.4  --  14.4 14.4 13.9 13.4 13.5 13.7  LYMPHSABS 1.1  --  2.3  --   --   --  1.3 1.9  MONOABS 1.1*  --  1.3*  --   --   --  0.7 0.9  EOSABS 0.0  --  0.2  --   --   --  0.4 0.4  BASOSABS 0.1  --  0.1  --   --   --  0.1 0.1   < > = values in this interval not displayed.    Recent Labs  Lab 08/22/22 1708 08/22/22 1809 08/22/22 2144 08/23/22 0615 08/23/22 9604 08/23/22 0932 08/23/22 1554 08/24/22 5409 08/25/22 0038 08/26/22 0628 08/27/22 0248 08/28/22 0442  NA 136  --    < > 134*  --   --    < > 135 136 140 140 138  K 5.5*  --    < > 5.2*  --   --    < > 5.5* 4.4 3.8 3.5 3.6  CL 102  --   --  101  --   --    < > 103 104 104 107 106  CO2 23  --   --  21*  --   --    < > 22 24 23 24 23   ANIONGAP 11  --    --  12  --   --    < > 10 8 13 9 9   GLUCOSE 138*  --   --  133*  --   --    < > 101* 101* 127* 116* 92  BUN 91*  --   --  82*  --   --    < > 75* 65* 34* 19 13  CREATININE 3.51*  --   --  3.33*  --   --    < > 3.05* 2.44* 1.64* 1.40* 1.41*  AST 17  --   --   --   --   --   --  20 16  --   --   --   ALT 16  --   --   --   --   --   --  18 15  --   --   --   ALKPHOS 67  --   --   --   --   --   --  58 56  --   --   --   BILITOT 0.4  --   --   --   --   --   --  0.6 0.8  --   --   --   ALBUMIN 2.7*  --   --   --   --   --   --  2.7* 2.5*  --   --   --   CRP  --   --   --   --   --   --   --   --   --  13.7* 9.7* 6.0*  PROCALCITON  --   --   --   --   --   --   --   --   --  0.17 0.11 <0.10  LATICACIDVEN  --  1.2  --   --  1.1  --   --   --   --   --   --   --   INR  --   --   --   --   --  1.2  --   --   --  1.2  --   --   HGBA1C  --   --   --  6.2*  --   --   --   --   --   --   --   --   AMMONIA  --   --   --   --   --   --   --   --   --  46*  --   --   BNP 31.5  --   --   --   --   --   --   --   --  345.3* 55.6 39.5  MG  --   --   --   --   --   --   --   --   --  2.0 2.0 2.0  CALCIUM 8.5*  --   --  8.4*  --   --    < > 8.7* 8.5* 9.2 8.9 8.8*   < > = values in this interval not displayed.      Recent Labs  Lab 08/22/22 1708 08/22/22 1809 08/23/22 0615 08/23/22 1610 08/23/22 0932 08/23/22 1554 08/24/22 9604 08/25/22 0038 08/26/22 0628 08/27/22 0248 08/28/22 0442  CRP  --   --   --   --   --   --   --   --  13.7* 9.7* 6.0*  PROCALCITON  --   --   --   --   --   --   --   --  0.17 0.11 <0.10  LATICACIDVEN  --  1.2  --  1.1  --   --   --   --   --   --   --   INR  --   --   --   --  1.2  --   --   --  1.2  --   --   HGBA1C  --   --  6.2*  --   --   --   --   --   --   --   --   AMMONIA  --   --   --   --   --   --   --   --  46*  --   --   BNP 31.5  --   --   --   --   --   --   --  345.3* 55.6 39.5  MG  --   --   --   --   --   --   --   --  2.0 2.0 2.0  CALCIUM 8.5*  --   8.4*  --   --    < > 8.7* 8.5* 9.2 8.9 8.8*   < > = values in this interval not displayed.    Lab Results  Component Value Date   HGBA1C 6.2 (H) 08/23/2022    No results for input(s): "TSH", "T4TOTAL", "T3FREE", "THYROIDAB" in the last 72 hours.  Invalid input(s): "FREET3" ------------------------------------------------------------------------------------------------------------------ Cardiac Enzymes No results for input(s): "CKMB", "TROPONINI", "MYOGLOBIN" in the last 168 hours.  Invalid input(s): "CK"  Radiology Reports DG Chest Port 1 View  Result Date: 08/26/2022 CLINICAL DATA:  82 year old male with history of shortness of breath. EXAM: PORTABLE CHEST 1 VIEW COMPARISON:  Chest x-ray 08/22/2022. FINDINGS: Lung volumes are low, and image is under penetrated limiting the diagnostic sensitivity and specificity of today's examination. With these limitations in mind, there is no definite consolidative airspace disease. No pleural effusions. No pneumothorax. No pulmonary nodule or mass noted. Pulmonary vasculature and the cardiomediastinal silhouette are within normal limits. Atherosclerosis in the thoracic aorta. IMPRESSION: 1. Low lung volumes without radiographic evidence of acute cardiopulmonary disease. 2. Aortic atherosclerosis. Electronically Signed   By: Trudie Reed M.D.   On: 08/26/2022 06:53   VAS [name] LOWER EXTREMITY VENOUS (DVT)  Result Date: 08/25/2022  Lower Venous DVT Study Patient Name:  MATYAS MONNOT  Date of Exam:   08/24/2022 Medical Rec #: [id number]         Accession #:    [id number] Date of Birth: 02-Aug-1940         Patient Gender: M Patient Age:   82 years Exam Location:  [facility] Procedure:      VAS [name] LOWER EXTREMITY VENOUS (DVT) Referring Phys: Felisa Bonier --------------------------------------------------------------------------------  Indications: Edema.  Limitations: Body habitus. Comparison Study: No previous study. Performing Technologist: McKayla  Maag RVT, VT  Examination Guidelines: A complete evaluation includes B-mode imaging, spectral Doppler, color Doppler, and power Doppler as needed of all accessible portions of each vessel. Bilateral testing is considered an integral part of a complete examination. Limited examinations for reoccurring indications may be performed as noted. The reflux portion of the exam is performed with the patient in reverse Trendelenburg.  +---------+---------------+---------+-----------+----------+-------------------+ RIGHT    CompressibilityPhasicitySpontaneityPropertiesThrombus Aging      +---------+---------------+---------+-----------+----------+-------------------+ CFV      Full           Yes      Yes                                      +---------+---------------+---------+-----------+----------+-------------------+  SFJ      Full                                                             +---------+---------------+---------+-----------+----------+-------------------+ FV Prox  Full                                                             +---------+---------------+---------+-----------+----------+-------------------+ FV Mid   Full                                                             +---------+---------------+---------+-----------+----------+-------------------+ FV DistalFull                                                             +---------+---------------+---------+-----------+----------+-------------------+ PFV      Full                                                             +---------+---------------+---------+-----------+----------+-------------------+ POP      Full           Yes      Yes                                      +---------+---------------+---------+-----------+----------+-------------------+ PTV      Full                                         Not well visualized  +---------+---------------+---------+-----------+----------+-------------------+ PERO                                                  Not visualized      +---------+---------------+---------+-----------+----------+-------------------+   +---------+---------------+---------+-----------+----------+-------------------+ LEFT     CompressibilityPhasicitySpontaneityPropertiesThrombus Aging      +---------+---------------+---------+-----------+----------+-------------------+ CFV      Full           Yes      Yes                                      +---------+---------------+---------+-----------+----------+-------------------+ SFJ      Full                                                             +---------+---------------+---------+-----------+----------+-------------------+  FV Prox  Full                                                             +---------+---------------+---------+-----------+----------+-------------------+ FV Mid   Full                                                             +---------+---------------+---------+-----------+----------+-------------------+ FV DistalFull                                                             +---------+---------------+---------+-----------+----------+-------------------+ PFV      Full                                                             +---------+---------------+---------+-----------+----------+-------------------+ POP      Full           Yes      Yes                                      +---------+---------------+---------+-----------+----------+-------------------+ PTV      Full                                         Not well visualized +---------+---------------+---------+-----------+----------+-------------------+ PERO                                                  Not visualized      +---------+---------------+---------+-----------+----------+-------------------+      Summary: RIGHT: - There is no evidence of deep vein thrombosis in the lower extremity. However, portions of this examination were limited- see technologist comments above.  - No cystic structure found in the popliteal fossa.  LEFT: - There is no evidence of deep vein thrombosis in the lower extremity. However, portions of this examination were limited- see technologist comments above.  - No cystic structure found in the popliteal fossa.  *See table(s) above for measurements and observations. Electronically signed by Coral Else MD on 08/25/2022 at 3:00:51 PM.    Final    ECHOCARDIOGRAM COMPLETE  Result Date: 08/24/2022    ECHOCARDIOGRAM REPORT   Patient Name:   FONNIE WELBURN Date of Exam: 08/24/2022 Medical Rec #:  [id number]        Height:       72.0 in Accession #:    [id number]       Weight:  351.2 lb Date of Birth:  1941-01-29        BSA:          2.707 m Patient Age:    62 years         BP:           87/64 mmHg Patient Gender: M                HR:           67 bpm. Exam Location:  Inpatient Procedure: 2D Echo, Cardiac Doppler, Color Doppler and Intracardiac            Opacification Agent Indications:    Shock R57.9  History:        Patient has prior history of Echocardiogram examinations, most                 recent 07/09/2020. CHF, Signs/Symptoms:Syncope; Risk                 Factors:Hypertension, Sleep Apnea, Diabetes and Dyslipidemia.  Sonographer:    Lucendia Herrlich Referring Phys: [id number] Ivor Costa MEIER  Sonographer Comments: Technically difficult study due to poor echo windows, suboptimal parasternal window, suboptimal apical window, suboptimal subcostal window and patient is obese. IMPRESSIONS  1. Left ventricular ejection fraction, by estimation, is 60 to 65%. The left ventricle has normal function. The left ventricle has no regional wall motion abnormalities. Left ventricular diastolic parameters are consistent with Grade I diastolic dysfunction (impaired relaxation).  2. Right ventricular  systolic function is normal. The right ventricular size is normal.  3. The mitral valve is normal in structure. No evidence of mitral valve regurgitation. No evidence of mitral stenosis.  4. The aortic valve has an indeterminant number of cusps. Aortic valve regurgitation is not visualized. No aortic stenosis is present.  5. Pulmonic valve regurgitation not well visualized.  6. The inferior vena cava is dilated in size with <50% respiratory variability, suggesting right atrial pressure of 15 mmHg. FINDINGS  Left Ventricle: Left ventricular ejection fraction, by estimation, is 60 to 65%. The left ventricle has normal function. The left ventricle has no regional wall motion abnormalities. Definity contrast agent was given IV to delineate the left ventricular  endocardial borders. The left ventricular internal cavity size was normal in size. There is no left ventricular hypertrophy. Left ventricular diastolic function could not be evaluated due to atrial fibrillation. Left ventricular diastolic parameters are  consistent with Grade I diastolic dysfunction (impaired relaxation). Right Ventricle: The right ventricular size is normal. Right ventricular systolic function is normal. Left Atrium: Left atrial size was normal in size. Right Atrium: Right atrial size was normal in size. Pericardium: Trivial pericardial effusion is present. Mitral Valve: The mitral valve is normal in structure. No evidence of mitral valve regurgitation. No evidence of mitral valve stenosis. Tricuspid Valve: The tricuspid valve is normal in structure. Tricuspid valve regurgitation is not demonstrated. No evidence of tricuspid stenosis. Aortic Valve: The aortic valve has an indeterminant number of cusps. Aortic valve regurgitation is not visualized. No aortic stenosis is present. Aortic valve peak gradient measures 11.4 mmHg. Pulmonic Valve: The pulmonic valve was not well visualized. Pulmonic valve regurgitation not well visualized. Aorta: The  aortic root is normal in size and structure. Venous: The inferior vena cava is dilated in size with less than 50% respiratory variability, suggesting right atrial pressure of 15 mmHg. IAS/Shunts: No atrial level shunt detected by color flow Doppler.  LEFT VENTRICLE PLAX 2D LVIDd:  4.20 cm   Diastology LVIDs:         2.70 cm   LV e' medial:    6.84 cm/s LV PW:         1.60 cm   LV E/e' medial:  13.8 LV IVS:        1.10 cm   LV e' lateral:   9.32 cm/s LVOT diam:     2.40 cm   LV E/e' lateral: 10.1 LV SV:         81 LV SV Index:   30 LVOT Area:     4.52 cm  RIGHT VENTRICLE             IVC RV S prime:     17.10 cm/s  IVC diam: 2.20 cm TAPSE (M-mode): 1.5 cm AORTIC VALVE AV Area (Vmax): 2.56 cm AV Vmax:        169.00 cm/s AV Peak Grad:   11.4 mmHg LVOT Vmax:      95.72 cm/s LVOT Vmean:     63.140 cm/s LVOT VTI:       0.180 m  AORTA Ao Root diam: 3.50 cm Ao Asc diam:  3.20 cm MITRAL VALVE MV Area (PHT): 3.91 cm     SHUNTS MV Decel Time: 194 msec     Systemic VTI:  0.18 m MV E velocity: 94.30 cm/s   Systemic Diam: 2.40 cm MV A velocity: 102.00 cm/s MV E/A ratio:  0.92 Olga Millers MD Electronically signed by Olga Millers MD Signature Date/Time: 08/24/2022/3:04:02 PM    Final       Signature  -   Susa Raring M.D on 08/28/2022 at 9:08 AM   -  To page go to [email or web address]

## 2022-08-29 ENCOUNTER — Inpatient Hospital Stay (HOSPITAL_COMMUNITY): Payer: Medicare Other

## 2022-08-29 ENCOUNTER — Encounter (HOSPITAL_COMMUNITY)
Admission: EM | Disposition: A | Discharge status: Skilled Nursing Facility | Payer: Self-pay | Source: Skilled Nursing Facility | Attending: Internal Medicine

## 2022-08-29 ENCOUNTER — Encounter (HOSPITAL_COMMUNITY): Payer: Self-pay | Admitting: Pulmonary Disease

## 2022-08-29 DIAGNOSIS — A419 Sepsis, unspecified organism: Secondary | ICD-10-CM | POA: Diagnosis not present

## 2022-08-29 DIAGNOSIS — K6389 Other specified diseases of intestine: Secondary | ICD-10-CM

## 2022-08-29 DIAGNOSIS — K529 Noninfective gastroenteritis and colitis, unspecified: Secondary | ICD-10-CM

## 2022-08-29 DIAGNOSIS — D123 Benign neoplasm of transverse colon: Secondary | ICD-10-CM

## 2022-08-29 DIAGNOSIS — Z7984 Long term (current) use of oral hypoglycemic drugs: Secondary | ICD-10-CM

## 2022-08-29 DIAGNOSIS — D125 Benign neoplasm of sigmoid colon: Secondary | ICD-10-CM

## 2022-08-29 DIAGNOSIS — D124 Benign neoplasm of descending colon: Secondary | ICD-10-CM | POA: Diagnosis not present

## 2022-08-29 DIAGNOSIS — K5190 Ulcerative colitis, unspecified, without complications: Secondary | ICD-10-CM

## 2022-08-29 DIAGNOSIS — R6521 Severe sepsis with septic shock: Secondary | ICD-10-CM | POA: Diagnosis not present

## 2022-08-29 DIAGNOSIS — I10 Essential (primary) hypertension: Secondary | ICD-10-CM

## 2022-08-29 DIAGNOSIS — D126 Benign neoplasm of colon, unspecified: Secondary | ICD-10-CM

## 2022-08-29 DIAGNOSIS — E119 Type 2 diabetes mellitus without complications: Secondary | ICD-10-CM

## 2022-08-29 HISTORY — PX: BIOPSY: SHX5522

## 2022-08-29 HISTORY — PX: COLONOSCOPY: SHX5424

## 2022-08-29 HISTORY — PX: POLYPECTOMY: SHX5525

## 2022-08-29 LAB — C-REACTIVE PROTEIN: CRP: 4.8 mg/dL — ABNORMAL HIGH (ref <1.0)

## 2022-08-29 LAB — GLUCOSE, CAPILLARY
Glucose-Capillary: 87 mg/dL (ref 70–99)
Glucose-Capillary: 83 mg/dL (ref 70–99)
Glucose-Capillary: 117 mg/dL — ABNORMAL HIGH (ref 70–99)
Glucose-Capillary: 89 mg/dL (ref 70–99)
Glucose-Capillary: 106 mg/dL — ABNORMAL HIGH (ref 70–99)

## 2022-08-29 LAB — BASIC METABOLIC PANEL
Anion gap: 13 (ref 5–15)
BUN: 12 mg/dL (ref 8–23)
CO2: 21 mmol/L — ABNORMAL LOW (ref 22–32)
Calcium: 8.8 mg/dL — ABNORMAL LOW (ref 8.9–10.3)
Chloride: 106 mmol/L (ref 98–111)
Creatinine, Ser: 1.54 mg/dL — ABNORMAL HIGH (ref 0.61–1.24)
GFR, Estimated: 46 mL/min — ABNORMAL LOW (ref >60)
Glucose, Bld: 89 mg/dL (ref 70–99)
Potassium: 3.5 mmol/L (ref 3.5–5.1)
Sodium: 140 mmol/L (ref 135–145)

## 2022-08-29 LAB — CBC WITH DIFFERENTIAL/PLATELET
Abs Immature Granulocytes: 0.11 10*3/uL — ABNORMAL HIGH (ref 0.00–0.07)
Basophils Absolute: 0.1 10*3/uL (ref 0.0–0.1)
Basophils Relative: 1 %
Eosinophils Absolute: 0.3 10*3/uL (ref 0.0–0.5)
Eosinophils Relative: 3 %
HCT: 30.8 % — ABNORMAL LOW (ref 39.0–52.0)
Hemoglobin: 10 g/dL — ABNORMAL LOW (ref 13.0–17.0)
Immature Granulocytes: 1 %
Lymphocytes Relative: 20 %
Lymphs Abs: 1.9 10*3/uL (ref 0.7–4.0)
MCH: 28.2 pg (ref 26.0–34.0)
MCHC: 32.5 g/dL (ref 30.0–36.0)
MCV: 87 fL (ref 80.0–100.0)
Monocytes Absolute: 1 10*3/uL (ref 0.1–1.0)
Monocytes Relative: 10 %
Neutro Abs: 6.4 10*3/uL (ref 1.7–7.7)
Neutrophils Relative %: 65 %
Platelets: 336 10*3/uL (ref 150–400)
RBC: 3.54 MIL/uL — ABNORMAL LOW (ref 4.22–5.81)
RDW: 13.8 % (ref 11.5–15.5)
WBC: 9.8 10*3/uL (ref 4.0–10.5)
nRBC: 0 % (ref 0.0–0.2)

## 2022-08-29 LAB — CULTURE, BLOOD (ROUTINE X 2)

## 2022-08-29 LAB — MAGNESIUM: Magnesium: 1.9 mg/dL (ref 1.7–2.4)

## 2022-08-29 LAB — BRAIN NATRIURETIC PEPTIDE: B Natriuretic Peptide: 30.3 pg/mL (ref 0.0–100.0)

## 2022-08-29 LAB — PROCALCITONIN: Procalcitonin: <0.1 ng/mL

## 2022-08-29 SURGERY — COLONOSCOPY
Anesthesia: Monitor Anesthesia Care

## 2022-08-29 MED ORDER — IPRATROPIUM-ALBUTEROL 0.5-2.5 (3) MG/3ML IN SOLN
RESPIRATORY_TRACT | Status: AC
  Filled 2022-08-29: qty 3

## 2022-08-29 MED ORDER — SODIUM CHLORIDE 0.9 % IV SOLN: INTRAVENOUS | Status: DC

## 2022-08-29 MED ORDER — LACTATED RINGERS IV SOLN
INTRAVENOUS | Status: AC | PRN
  Administered 2022-08-29: 1000 mL via INTRAVENOUS

## 2022-08-29 MED ORDER — LIDOCAINE 2% (20 MG/ML) 5 ML SYRINGE
INTRAMUSCULAR | Status: DC | PRN
  Administered 2022-08-29: 100 mg via INTRAVENOUS

## 2022-08-29 MED ORDER — PROPOFOL 10 MG/ML IV BOLUS
INTRAVENOUS | Status: DC | PRN
  Administered 2022-08-29 (×2): 50 mg via INTRAVENOUS
  Administered 2022-08-29: 60 mg via INTRAVENOUS
  Administered 2022-08-29: 50 mg via INTRAVENOUS
  Administered 2022-08-29: 30 mg via INTRAVENOUS
  Administered 2022-08-29: 100 mg via INTRAVENOUS
  Administered 2022-08-29: 60 mg via INTRAVENOUS

## 2022-08-29 MED ORDER — PHENYLEPHRINE 80 MCG/ML (10ML) SYRINGE FOR IV PUSH (FOR BLOOD PRESSURE SUPPORT)
PREFILLED_SYRINGE | INTRAVENOUS | Status: DC | PRN
  Administered 2022-08-29: 120 ug via INTRAVENOUS
  Administered 2022-08-29 (×2): 160 ug via INTRAVENOUS
  Administered 2022-08-29: 120 ug via INTRAVENOUS
  Administered 2022-08-29: 80 ug via INTRAVENOUS

## 2022-08-29 MED ORDER — GLUCAGON HCL RDNA (DIAGNOSTIC) 1 MG IJ SOLR
INTRAMUSCULAR | Status: AC
  Filled 2022-08-29: qty 1

## 2022-08-29 MED ORDER — POTASSIUM CHLORIDE CRYS ER 20 MEQ PO TBCR
40.0000 meq | EXTENDED_RELEASE_TABLET | Freq: Once | ORAL | Status: AC
  Administered 2022-08-29: 40 meq via ORAL
  Filled 2022-08-29: qty 2

## 2022-08-29 MED ORDER — GLUCAGON HCL RDNA (DIAGNOSTIC) 1 MG IJ SOLR
INTRAMUSCULAR | Status: DC | PRN
  Administered 2022-08-29: .5 mg via INTRAVENOUS

## 2022-08-29 MED ORDER — PANTOPRAZOLE SODIUM 40 MG PO TBEC
40.0000 mg | DELAYED_RELEASE_TABLET | Freq: Every day | ORAL | Status: DC
  Administered 2022-08-30: 40 mg via ORAL
  Filled 2022-08-29 (×2): qty 1

## 2022-08-29 NOTE — Op Note (Signed)
[facility] Patient Name: Brian Silva Procedure Date : 08/29/2022 MRN: [id number] Attending MD: Beverley Fiedler , MD, [id number] Date of Birth: 1940-10-06 CSN: [id number] Age: 82 Admit Type: Inpatient Procedure:                Colonoscopy Indications:              Abnormal CT of the GI tract (proctocolitis), recent                            sepsis, blood in stool Providers:                Carie Caddy. Rhea Belton, MD, Fransisca Connors, Priscella Mann,                            Technician Referring MD:             [facility] Medicines:                Monitored Anesthesia Care Complications:            No immediate complications. Estimated Blood Loss:     Estimated blood loss was minimal. Procedure:                Pre-Anesthesia Assessment:                           - Prior to the procedure, a History and Physical                            was performed, and patient medications and                            allergies were reviewed. The patient's tolerance of                            previous anesthesia was also reviewed. The risks                            and benefits of the procedure and the sedation                            options and risks were discussed with the patient.                            All questions were answered, and informed consent                            was obtained. Prior Anticoagulants: The patient has                            taken no anticoagulant or antiplatelet agents. ASA                            Grade Assessment: III - A patient with severe  systemic disease. After reviewing the risks and                            benefits, the patient was deemed in satisfactory                            condition to undergo the procedure.                           After obtaining informed consent, the colonoscope                            was passed under direct vision. Throughout the                             procedure, the patient's blood pressure, pulse, and                            oxygen saturations were monitored continuously. The                            CF-HQ190L ([id number]) Olympus coloscope was                            introduced through the anus and advanced to the                            cecum, identified by appendiceal orifice and                            ileocecal valve. The colonoscopy was performed                            without difficulty. The patient tolerated the                            procedure well. The quality of the bowel                            preparation was good. The ileocecal valve,                            appendiceal orifice, and rectum were photographed. Scope In: 11:03:15 AM Scope Out: 11:27:18 AM Scope Withdrawal Time: 0 hours 21 minutes 37 seconds  Total Procedure Duration: 0 hours 24 minutes 3 seconds  Findings:      The digital rectal exam was normal.      Five sessile polyps were found in the transverse colon. The polyps were       3 to 18 mm in size. These polyps were removed with a cold snare.       Resection and retrieval were complete.      Two sessile polyps were found in the descending colon. The polyps were 4       to 7 mm in size. These polyps were removed with a cold snare. Resection  and retrieval were complete.      A 6 mm polyp was found in the sigmoid colon. The polyp was sessile. The       polyp was removed with a cold snare. Resection and retrieval were       complete.      Segmental and patchy moderate mucosal changes characterized by altered       vascularity, congestion (edema), erosions and erythema were found in the       recto-sigmoid colon, in the mid sigmoid colon and in the distal sigmoid       colon. Biopsies were taken with a cold forceps for histology. This does       not have the typical appearance for IBD and does not appear neoplastic.       The majority of the rectum and from the proximal  sigmoid to cecum       appeared normal (no colitis).      The retroflexed view of the distal rectum and anal verge was normal and       showed no anal or rectal abnormalities. Impression:               - Five 3 to 18 mm polyps in the transverse colon,                            removed with a cold snare. Resected and retrieved.                           - Two 4 to 7 mm polyps in the descending colon,                            removed with a cold snare. Resected and retrieved.                           - One 6 mm polyp in the sigmoid colon, removed with                            a cold snare. Resected and retrieved.                           - Segmental and patchy moderate mucosal changes                            were found in the recto-sigmoid colon, in the mid                            sigmoid colon and in the distal sigmoid colon, rule                            out ischemic colitis. Biopsied. Normal mucosa cecum                            to proximal sigmoid and mid and distal rectum                            (other than polyps removed as  above).                           - The distal rectum and anal verge are normal on                            retroflexion view. Moderate Sedation:      N/A Recommendation:           - Return patient to hospital ward for ongoing care.                           - Advance diet as tolerated.                           - Continue present medications.                           - Await pathology results. Colitis is likely                            ischemic versus resolving infectious.                           - No recommendation at this time regarding repeat                            colonoscopy due to age.                           - GI will sign off, but be available. Call if                            questions. Procedure Code(s):        --- Professional ---                           [phone], Colonoscopy, flexible; with removal of                             tumor(s), polyp(s), or other lesion(s) by snare                            technique                           45380, 59, Colonoscopy, flexible; with biopsy,                            single or multiple Diagnosis Code(s):        --- Professional ---                           D12.3, Benign neoplasm of transverse colon (hepatic                            flexure or splenic flexure)  D12.4, Benign neoplasm of descending colon                           D12.5, Benign neoplasm of sigmoid colon                           K63.89, Other specified diseases of intestine                           R93.3, Abnormal findings on diagnostic imaging of                            other parts of digestive tract CPT copyright 2022 American [employer]. All rights reserved. The codes documented in this report are preliminary and upon coder review may  be revised to meet current compliance requirements. Beverley Fiedler, MD 08/29/2022 11:44:51 AM This report has been signed electronically. Number of Addenda: 0

## 2022-08-29 NOTE — Anesthesia Postprocedure Evaluation (Signed)
Anesthesia Post Note  Patient: Brian Silva  Procedure(s) Performed: COLONOSCOPY POLYPECTOMY BIOPSY     Patient location during evaluation: Endoscopy Anesthesia Type: MAC Level of consciousness: awake and alert Pain management: pain level controlled Vital Signs Assessment: post-procedure vital signs reviewed and stable Respiratory status: spontaneous breathing, nonlabored ventilation and respiratory function stable Cardiovascular status: stable and blood pressure returned to baseline Postop Assessment: no apparent nausea or vomiting Anesthetic complications: no  No notable events documented.  Last Vitals:  Vitals:   08/29/22 1145 08/29/22 1200  BP: (!) 141/59 (!) 152/63  Pulse: 68 63  Resp: 20 19  Temp: 36.8 C   SpO2: 99% 95%    Last Pain:  Vitals:   08/29/22 1145  TempSrc:   PainSc: 0-No pain                 Callia Swim,W. EDMOND

## 2022-08-29 NOTE — Progress Notes (Signed)
OT Cancellation Note  Patient Details Name: Brian Silva MRN: [id number] DOB: June 18, 1940   Cancelled Treatment:    Reason Eval/Treat Not Completed: Patient at procedure or test/ unavailable  Evern Bio 08/29/2022, 10:38 AM Berna Spare, OTR/L Acute Rehabilitation Services Office: [phone]

## 2022-08-29 NOTE — Transfer of Care (Signed)
Immediate Anesthesia Transfer of Care Note  Patient: Brian Silva  Procedure(s) Performed: COLONOSCOPY POLYPECTOMY BIOPSY  Patient Location: PACU  Anesthesia Type:MAC  Level of Consciousness: awake and patient cooperative  Airway & Oxygen Therapy: Patient Spontanous Breathing and Patient connected to nasal cannula oxygen  Post-op Assessment: Report given to RN and Post -op Vital signs reviewed and stable  Post vital signs: stable  Last Vitals:  Vitals Value Taken Time  BP 141/59 08/29/22 1145  Temp    Pulse 64 08/29/22 1147  Resp 26 08/29/22 1147  SpO2 97 % 08/29/22 1147  Vitals shown include unvalidated device data.  Last Pain:  Vitals:   08/29/22 1011  TempSrc: Temporal  PainSc: 0-No pain      Patients Stated Pain Goal: 0 (08/25/22 2118)  Complications: No notable events documented.

## 2022-08-29 NOTE — Progress Notes (Signed)
Pt assessed for PRN Bipap order. No distress noted. Pt resting comfortably. Vitals stable. No BIPAP needed at this time.  

## 2022-08-29 NOTE — Progress Notes (Signed)
PROGRESS NOTE                                                                                                                                                                                                             Patient Demographics:    Brian Silva, is a 82 y.o. male, DOB - 1940/04/29, RUE:[id number]  Outpatient Primary MD for the patient is Merrilyn Puma, DO    LOS - 7  Admit date - 08/22/2022    Chief Complaint  Patient presents with   Rectal Bleeding       Brief Narrative (HPI from H&P)   82 year old male with PMH as below, which is significant for, chronic respiratory failure on 2L, OSA not on CPAP, chronic incarcerated umbilical hernia, wheelchair bound, and HTN. He resides in [facility]. Presented to [facility] ED via EMS 5/1 with complaints of rectal bleeding x 2 episodes. Also had one episode of non-bloody emesis the night prior and abdominal pain for approximately 4 days.    He was diagnosed with sepsis workup suggestive of proctocolitis on CT scan, he was admitted in ICU required IV fluids and vasopressors, he was there for 4 days now transition to oral Augmentin and transferred to hospitalist service under my care on 08/26/2022 on day 4 of his hospital stay.   Significant Hospital Events:   5/1 concern for evolving sepsis in the setting of proctocolitis 5/2 progressive hypotension prompting insertion of A-line, revealed stable hemodynamics.  Appears hypercapnic on exam, BiPAP ordered 5/3 weaning pressors and adv diet to clears  5/4 off levo, transfer out  5/5 transferred to [facility] service under my care.   Subjective:   Patient in bed, appears comfortable, denies any headache, no fever, no chest pain or pressure, no shortness of breath , no abdominal pain. No new focal weakness.   Assessment  & Plan :    Acute metabolic encephalopathy, improving -  Likely due to sepsis improving, mild  encephalopathy which will be managed supportively, low-dose Seroquel, nighttime CPAP to continue currently not hypercapnic, minimize benzodiazepines and narcotics.    AoC hypoxic and hypercarbic resp failure, OSA -  -baseline 2L, nightly CPAP but noncompliant with it despite counseling, continue nighttime oxygen which she prefers, encouraged to sit in chair use I-S and flutter valve for pulmonary toiletry in the daytime.  Septic shock 2/2 proctocolitis - with sepsis at the time of admission, also had 2 episodes of lower GI bleed at the time of admission, 1 out of 4 blood cultures growing staph hemolyticus is most likely contaminant, will trend with procalcitonin, CRP, repeat blood cultures, currently on oral Augmentin, GI following contemplating colonoscopy versus flex sig likely on 08/29/2022.  No signs of ongoing GI bleed.  Sepsis pathophysiology has resolved.   Chronic HFpEF Hx HTN  P: - BP improving low-dose Coreg.   AKI on CKD II -Good urine output, has Foley catheter placed and ICU, renal function trending towards improvement.  Continue to monitor.   Bladder outflow obstruction.  Underwent Foley catheter placement at the time of admission in ICU, Flomax added on 08/27/2022, trial of Foley catheter removal on 08/28/2022.  Foley catheter ordered by the ICU team.  Possible GIB  -there was report of BRBPR, GI on board, on PPI colonoscopy likely 08/29/2022.   Anemia -- acute on chronic -iatrogenic lab draws, 2 episodes of lower GI bleed.  Monitor.   Chronic pain P: -holding home meds w encephalopathy    Physical debility, deconditioning P: -PT/OT         Condition - Extremely Guarded  Family Communication  :  None present  Code Status :  Full  Consults  :  PCCM  PUD Prophylaxis :  PPI   Procedures  :     Lower extremity venous duplex bilateral.  No DVT.  Echocardiogram.  1. Left ventricular ejection fraction, by estimation, is 60 to 65%. The left ventricle has normal  function. The left ventricle has no regional wall motion abnormalities. Left ventricular diastolic parameters are consistent with Grade I diastolic dysfunction (impaired relaxation).  2. Right ventricular systolic function is normal. The right ventricular size is normal.  3. The mitral valve is normal in structure. No evidence of mitral valve regurgitation. No evidence of mitral stenosis.  4. The aortic valve has an indeterminant number of cusps. Aortic valve regurgitation is not visualized. No aortic stenosis is present.  5. Pulmonic valve regurgitation not well visualized.  6. The inferior vena cava is dilated in size with <50% respiratory variability, suggesting right atrial pressure of 15 mmHg.   CT abdomen pelvis.  Proctocolitis.      Disposition Plan  :    Status is: Inpatient   DVT Prophylaxis  :    heparin injection 5,000 Units Start: 08/22/22 2200    Lab Results  Component Value Date   PLT 336 08/29/2022    Diet :  Diet Order             Diet NPO time specified  Diet effective midnight                    Inpatient Medications  Scheduled Meds:  [MAR Hold] amoxicillin-clavulanate  1 tablet Oral Q8H   [MAR Hold] carvedilol  3.125 mg Oral BID WC   [MAR Hold] Chlorhexidine Gluconate Cloth  6 each Topical Daily   [MAR Hold] Gerhardt's butt cream   Topical BID   [MAR Hold] heparin  5,000 Units Subcutaneous Q8H   [MAR Hold] insulin aspart  0-9 Units Subcutaneous TID WC   [MAR Hold] pantoprazole  40 mg Oral BID   [MAR Hold] QUEtiapine  25 mg Oral QHS   [MAR Hold] tamsulosin  0.4 mg Oral Daily   Continuous Infusions:  sodium chloride Stopped (08/25/22 0000)   sodium chloride 20 mL/hr at 08/29/22 0825  lactated ringers 1,000 mL (08/29/22 1014)   PRN Meds:.[MAR Hold] acetaminophen, [MAR Hold] docusate sodium, [MAR Hold] ipratropium-albuterol, lactated ringers, [MAR Hold] mouth rinse, [MAR Hold] polyethylene glycol      Objective:   Vitals:   08/29/22 0645  08/29/22 0800 08/29/22 0825 08/29/22 1011  BP:  (!) 128/46 (!) 128/46 (!) 141/56  Pulse:  71 70 68  Resp: (!) 22 20  (!) 22  Temp:  98.4 F (36.9 C)    TempSrc:  Oral  Temporal  SpO2:    100%  Weight:    (!) 150.7 kg  Height:        Wt Readings from Last 3 Encounters:  08/29/22 (!) 150.7 kg  10/22/21 (!) 167.8 kg  09/10/21 (!) 165.1 kg     Intake/Output Summary (Last 24 hours) at 08/29/2022 1019 Last data filed at 08/28/2022 1120 Gross per 24 hour  Intake --  Output 500 ml  Net -500 ml     Physical Exam  Awake , not confused oriented x 3, No new F.N deficits, foley Basile.AT,PERRAL Supple Neck, No JVD,   Symmetrical Chest wall movement, Good air movement bilaterally, CTAB RRR,No Gallops,Rubs or new Murmurs,  +ve B.Sounds, Abd Soft, No tenderness,   No Cyanosis, Clubbing or edema       Data Review:    Recent Labs  Lab 08/22/22 1708 08/22/22 2144 08/23/22 0615 08/24/22 7829 08/25/22 0038 08/26/22 5621 08/27/22 0248 08/28/22 0442 08/29/22 0358  WBC 16.7*  --  17.4*   < > 7.8 9.3 7.0 9.3 9.8  HGB 10.4*   < > 10.1*   < > 8.8* 9.4* 9.4* 9.7* 10.0*  HCT 34.1*   < > 33.3*   < > 28.5* 29.1* 28.7* 29.4* 30.8*  PLT 287  --  302   < > 206 283 293 299 336  MCV 93.4  --  92.8   < > 91.3 89.3 87.0 86.2 87.0  MCH 28.5  --  28.1   < > 28.2 28.8 28.5 28.4 28.2  MCHC 30.5  --  30.3   < > 30.9 32.3 32.8 33.0 32.5  RDW 14.4  --  14.4   < > 13.9 13.4 13.5 13.7 13.8  LYMPHSABS 1.1  --  2.3  --   --   --  1.3 1.9 1.9  MONOABS 1.1*  --  1.3*  --   --   --  0.7 0.9 1.0  EOSABS 0.0  --  0.2  --   --   --  0.4 0.4 0.3  BASOSABS 0.1  --  0.1  --   --   --  0.1 0.1 0.1   < > = values in this interval not displayed.    Recent Labs  Lab 08/22/22 1708 08/22/22 1809 08/22/22 2144 08/23/22 0615 08/23/22 3086 08/23/22 0932 08/23/22 1554 08/24/22 5784 08/25/22 0038 08/26/22 6962 08/27/22 0248 08/28/22 0442 08/29/22 0358  NA 136  --    < > 134*  --   --    < > 135 136 140 140 138  140  K 5.5*  --    < > 5.2*  --   --    < > 5.5* 4.4 3.8 3.5 3.6 3.5  CL 102  --   --  101  --   --    < > 103 104 104 107 106 106  CO2 23  --   --  21*  --   --    < >  22 24 23 24 23  21*  ANIONGAP 11  --   --  12  --   --    < > 10 8 13 9 9 13   GLUCOSE 138*  --   --  133*  --   --    < > 101* 101* 127* 116* 92 89  BUN 91*  --   --  82*  --   --    < > 75* 65* 34* 19 13 12   CREATININE 3.51*  --   --  3.33*  --   --    < > 3.05* 2.44* 1.64* 1.40* 1.41* 1.54*  AST 17  --   --   --   --   --   --  20 16  --   --   --   --   ALT 16  --   --   --   --   --   --  18 15  --   --   --   --   ALKPHOS 67  --   --   --   --   --   --  58 56  --   --   --   --   BILITOT 0.4  --   --   --   --   --   --  0.6 0.8  --   --   --   --   ALBUMIN 2.7*  --   --   --   --   --   --  2.7* 2.5*  --   --   --   --   CRP  --   --   --   --   --   --   --   --   --  13.7* 9.7* 6.0* 4.8*  PROCALCITON  --   --   --   --   --   --   --   --   --  0.17 0.11 <0.10 <0.10  LATICACIDVEN  --  1.2  --   --  1.1  --   --   --   --   --   --   --   --   INR  --   --   --   --   --  1.2  --   --   --  1.2  --   --   --   HGBA1C  --   --   --  6.2*  --   --   --   --   --   --   --   --   --   AMMONIA  --   --   --   --   --   --   --   --   --  46*  --   --   --   BNP 31.5  --   --   --   --   --   --   --   --  345.3* 55.6 39.5 30.3  MG  --   --   --   --   --   --   --   --   --  2.0 2.0 2.0 1.9  CALCIUM 8.5*  --   --  8.4*  --   --    < > 8.7* 8.5* 9.2 8.9 8.8* 8.8*   < > = values in  this interval not displayed.      Recent Labs  Lab 08/22/22 1708 08/22/22 1809 08/23/22 0615 08/23/22 1610 08/23/22 0932 08/23/22 1554 08/25/22 0038 08/26/22 9604 08/27/22 0248 08/28/22 0442 08/29/22 0358  CRP  --   --   --   --   --   --   --  13.7* 9.7* 6.0* 4.8*  PROCALCITON  --   --   --   --   --   --   --  0.17 0.11 <0.10 <0.10  LATICACIDVEN  --  1.2  --  1.1  --   --   --   --   --   --   --   INR  --   --   --   --  1.2   --   --  1.2  --   --   --   HGBA1C  --   --  6.2*  --   --   --   --   --   --   --   --   AMMONIA  --   --   --   --   --   --   --  46*  --   --   --   BNP 31.5  --   --   --   --   --   --  345.3* 55.6 39.5 30.3  MG  --   --   --   --   --   --   --  2.0 2.0 2.0 1.9  CALCIUM 8.5*  --  8.4*  --   --    < > 8.5* 9.2 8.9 8.8* 8.8*   < > = values in this interval not displayed.    Lab Results  Component Value Date   HGBA1C 6.2 (H) 08/23/2022    No results for input(s): "TSH", "T4TOTAL", "T3FREE", "THYROIDAB" in the last 72 hours.  Invalid input(s): "FREET3" ------------------------------------------------------------------------------------------------------------------ Cardiac Enzymes No results for input(s): "CKMB", "TROPONINI", "MYOGLOBIN" in the last 168 hours.  Invalid input(s): "CK"  Radiology Reports DG Chest Port 1 View  Result Date: 08/26/2022 CLINICAL DATA:  82 year old male with history of shortness of breath. EXAM: PORTABLE CHEST 1 VIEW COMPARISON:  Chest x-ray 08/22/2022. FINDINGS: Lung volumes are low, and image is under penetrated limiting the diagnostic sensitivity and specificity of today's examination. With these limitations in mind, there is no definite consolidative airspace disease. No pleural effusions. No pneumothorax. No pulmonary nodule or mass noted. Pulmonary vasculature and the cardiomediastinal silhouette are within normal limits. Atherosclerosis in the thoracic aorta. IMPRESSION: 1. Low lung volumes without radiographic evidence of acute cardiopulmonary disease. 2. Aortic atherosclerosis. Electronically Signed   By: Trudie Reed M.D.   On: 08/26/2022 06:53      Signature  -   Susa Raring M.D on 08/29/2022 at 10:19 AM   -  To page go to [email or web address]

## 2022-08-29 NOTE — Interval H&P Note (Signed)
History and Physical Interval Note: For colonoscopy today to evaluate septic presentation with proctocolitis suggested by CT scan The nature of the procedure, as well as the risks, benefits, and alternatives were carefully and thoroughly reviewed with the patient. Ample time for discussion and questions allowed. The patient understood, was satisfied, and agreed to proceed.      Latest Ref Rng & Units 08/29/2022    3:58 AM 08/28/2022    4:42 AM 08/27/2022    2:48 AM  CBC  WBC 4.0 - 10.5 K/uL 9.8  9.3  7.0   Hemoglobin 13.0 - 17.0 g/dL 16.1  9.7  9.4   Hematocrit 39.0 - 52.0 % 30.8  29.4  28.7   Platelets 150 - 400 K/uL 336  299  293    Lab Results  Component Value Date   INR 1.2 08/26/2022   INR 1.2 08/23/2022   INR 1.07 04/30/2011      08/29/2022 10:24 AM  Brian Silva  has presented today for surgery, with the diagnosis of proctocolitis.  The various methods of treatment have been discussed with the patient and family. After consideration of risks, benefits and other options for treatment, the patient has consented to  Procedure(s): COLONOSCOPY (N/A) as a surgical intervention.  The patient's history has been reviewed, patient examined, no change in status, stable for surgery.  I have reviewed the patient's chart and labs.  Questions were answered to the patient's satisfaction.     Carie Caddy Gabriele Loveland

## 2022-08-29 NOTE — Anesthesia Preprocedure Evaluation (Signed)
Anesthesia Evaluation  Patient identified by MRN, date of birth, ID band Patient awake    Reviewed: Allergy & Precautions, H&P , NPO status , Patient's Chart, lab work & pertinent test results  Airway Mallampati: III  TM Distance: >3 FB Neck ROM: Full    Dental no notable dental hx. (+) Teeth Intact, Dental Advisory Given   Pulmonary asthma , sleep apnea and Oxygen sleep apnea    Pulmonary exam normal breath sounds clear to auscultation       Cardiovascular hypertension, Pt. on medications  Rhythm:Regular Rate:Normal     Neuro/Psych    Depression    negative neurological ROS     GI/Hepatic negative GI ROS, Neg liver ROS,,,  Endo/Other  diabetes, Type 2, Oral Hypoglycemic Agents  Morbid obesity  Renal/GU Renal InsufficiencyRenal disease  negative genitourinary   Musculoskeletal   Abdominal   Peds  Hematology  (+) Blood dyscrasia, anemia   Anesthesia Other Findings   Reproductive/Obstetrics negative OB ROS                             Anesthesia Physical Anesthesia Plan  ASA: 3  Anesthesia Plan: MAC   Post-op Pain Management: Minimal or no pain anticipated   Induction: Intravenous  PONV Risk Score and Plan: 1 and Propofol infusion  Airway Management Planned: Natural Airway and Simple Face Mask  Additional Equipment:   Intra-op Plan:   Post-operative Plan:   Informed Consent: I have reviewed the patients History and Physical, chart, labs and discussed the procedure including the risks, benefits and alternatives for the proposed anesthesia with the patient or authorized representative who has indicated his/her understanding and acceptance.     Dental advisory given  Plan Discussed with: CRNA  Anesthesia Plan Comments:        Anesthesia Quick Evaluation

## 2022-08-30 DIAGNOSIS — A419 Sepsis, unspecified organism: Secondary | ICD-10-CM | POA: Diagnosis not present

## 2022-08-30 DIAGNOSIS — R6521 Severe sepsis with septic shock: Secondary | ICD-10-CM | POA: Diagnosis not present

## 2022-08-30 LAB — BASIC METABOLIC PANEL
Anion gap: 12 (ref 5–15)
BUN: 16 mg/dL (ref 8–23)
CO2: 20 mmol/L — ABNORMAL LOW (ref 22–32)
Calcium: 8.9 mg/dL (ref 8.9–10.3)
Chloride: 109 mmol/L (ref 98–111)
Creatinine, Ser: 1.73 mg/dL — ABNORMAL HIGH (ref 0.61–1.24)
GFR, Estimated: 40 mL/min — ABNORMAL LOW (ref >60)
Glucose, Bld: 103 mg/dL — ABNORMAL HIGH (ref 70–99)
Potassium: 3.8 mmol/L (ref 3.5–5.1)
Sodium: 141 mmol/L (ref 135–145)

## 2022-08-30 LAB — CBC WITH DIFFERENTIAL/PLATELET
Abs Immature Granulocytes: 0.11 10*3/uL — ABNORMAL HIGH (ref 0.00–0.07)
Basophils Absolute: 0.1 10*3/uL (ref 0.0–0.1)
Basophils Relative: 1 %
Eosinophils Absolute: 0.2 10*3/uL (ref 0.0–0.5)
Eosinophils Relative: 3 %
HCT: 30.4 % — ABNORMAL LOW (ref 39.0–52.0)
Hemoglobin: 10 g/dL — ABNORMAL LOW (ref 13.0–17.0)
Immature Granulocytes: 1 %
Lymphocytes Relative: 22 %
Lymphs Abs: 1.9 10*3/uL (ref 0.7–4.0)
MCH: 28.8 pg (ref 26.0–34.0)
MCHC: 32.9 g/dL (ref 30.0–36.0)
MCV: 87.6 fL (ref 80.0–100.0)
Monocytes Absolute: 0.9 10*3/uL (ref 0.1–1.0)
Monocytes Relative: 10 %
Neutro Abs: 5.2 10*3/uL (ref 1.7–7.7)
Neutrophils Relative %: 63 %
Platelets: 323 10*3/uL (ref 150–400)
RBC: 3.47 MIL/uL — ABNORMAL LOW (ref 4.22–5.81)
RDW: 14.4 % (ref 11.5–15.5)
WBC: 8.4 10*3/uL (ref 4.0–10.5)
nRBC: 0 % (ref 0.0–0.2)

## 2022-08-30 LAB — SURGICAL PATHOLOGY

## 2022-08-30 LAB — BRAIN NATRIURETIC PEPTIDE: B Natriuretic Peptide: 20.8 pg/mL (ref 0.0–100.0)

## 2022-08-30 LAB — GLUCOSE, CAPILLARY
Glucose-Capillary: 88 mg/dL (ref 70–99)
Glucose-Capillary: 99 mg/dL (ref 70–99)

## 2022-08-30 MED ORDER — AMOXICILLIN-POT CLAVULANATE 500-125 MG PO TABS: 1.0000 | ORAL_TABLET | Freq: Three times a day (TID) | ORAL | Status: DC

## 2022-08-30 MED ORDER — ASPIRIN EC 81 MG PO TBEC: 81.0000 mg | DELAYED_RELEASE_TABLET | Freq: Every day | ORAL | 0 refills | Status: AC

## 2022-08-30 MED ORDER — PREGABALIN 75 MG PO CAPS: 75.0000 mg | ORAL_CAPSULE | Freq: Three times a day (TID) | ORAL | 0 refills | Status: DC

## 2022-08-30 MED ORDER — TAMSULOSIN HCL 0.4 MG PO CAPS: 0.4000 mg | ORAL_CAPSULE | Freq: Every day | ORAL | Status: AC

## 2022-08-30 MED ORDER — TRAMADOL HCL 50 MG PO TABS: 50.0000 mg | ORAL_TABLET | Freq: Two times a day (BID) | ORAL | 0 refills | Status: DC | PRN

## 2022-08-30 NOTE — Progress Notes (Signed)
OT Cancellation Note  Patient Details Name: Brian Silva MRN: [id number] DOB: [birth year]   Cancelled Treatment:    Reason Eval/Treat Not Completed: Other (comment) (Attempted to see pt for skilled OT session. However, RN stated pt about to receive a breathing treatment with pt not currently available for OT. OT will attempt to see pt later in the day as time allows.)  Finnian Husted "Ronaldo Miyamoto" M., OTR/L, MA Acute Rehab [phone]   Lendon Colonel 08/30/2022, 3:50 PM

## 2022-08-30 NOTE — Progress Notes (Signed)
Report called to Rennis Chris RN for patient transferring back to [facility]. Patient will be transported by [facility].

## 2022-08-30 NOTE — Progress Notes (Signed)
Patient  has refused meds being turned and repositioned. MD aware. Patient is discharging to a snif.

## 2022-08-30 NOTE — Discharge Summary (Addendum)
Brian Silva VHQ:[id number] DOB: 1940/05/15 DOA: 08/22/2022  PCP: Merrilyn Puma, DO  Admit date: 08/22/2022  Discharge date: 08/30/2022  Admitted From: SNF   Disposition:  SNF   Recommendations for Outpatient Follow-up:   Follow up with PCP in 1-2 weeks  PCP Please obtain BMP/CBC, 2 view CXR in 1week,  (see Discharge instructions)   PCP Please follow up on the following pending results:    Home Health: None   Equipment/Devices: None  Consultations: GI Discharge Condition: Stable    CODE STATUS: Full    Diet Recommendation: Heart Healthy Low Carb, check CBGs q. ACH S.    Chief Complaint  Patient presents with   Rectal Bleeding     Brief history of present illness from the day of admission and additional interim summary    82 year old male with PMH as below, which is significant for, chronic respiratory failure on 2L, OSA not on CPAP, chronic incarcerated umbilical hernia, wheelchair bound, and HTN. He resides in [facility]. Presented to [facility] ED via EMS 5/1 with complaints of rectal bleeding x 2 episodes. Also had one episode of non-bloody emesis the night prior and abdominal pain for approximately 4 days.     He was diagnosed with sepsis workup suggestive of proctocolitis on CT scan, he was admitted in ICU required IV fluids and vasopressors, he was there for 4 days now transition to oral Augmentin and transferred to hospitalist service under my care on 08/26/2022 on day 4 of his hospital stay.     Significant Hospital Events:    5/1 concern for evolving sepsis in the setting of proctocolitis 5/2 progressive hypotension prompting insertion of A-line, revealed stable hemodynamics.  Appears hypercapnic on exam, BiPAP ordered 5/3 weaning pressors and adv diet to clears  5/4 off levo, transfer out   5/5 transferred to [facility] service under my care.                                                                 Hospital Course   Acute metabolic encephalopathy, improving -  Likely due to sepsis improving, mild encephalopathy which will be managed supportively, low-dose Seroquel, nighttime CPAP to continue currently not hypercapnic, minimize benzodiazepines and narcotics.     AoC hypoxic and hypercarbic resp failure, OSA -  -baseline 2L at nighttime and as needed daytime which should be continued, does not use CPAP at SNF and did not use it in the hospital, continue nighttime oxygen which she prefers, encouraged to sit in chair use I-S and flutter valve for pulmonary toiletry in the daytime.  He now confirms that he does not use CPAP.   Septic shock 2/2 proctocolitis - with sepsis at the time of admission, also had 2 episodes of lower GI bleed at the time of  admission, 1 out of 4 blood cultures growing staph hemolyticus was contaminant, seen by GI underwent colonoscopy where multiple polyps were removed, colonoscopy suggestive of ischemic versus infectious colitis, he will be placed on Augmentin for 7 more days, resume home dose aspirin in a few days, outpatient follow-up with GI in a week to 10 days.  Now symptom-free.  Sepsis has completely resolved.   Chronic HFpEF Hx HTN  P: - BP proved resume home regimen.   AKI on CKD II -Good urine output, has Foley catheter placed and ICU, renal function trending towards improvement.  Continue to monitor.   Bladder outflow obstruction.  Underwent Foley catheter placement at the time of admission in ICU, Flomax added on 08/27/2022, Foley removed successfully 08/28/2022.   Possible GIB  -there was report of BRBPR, GI on board, on PPI colonoscopy likely 08/29/2022.   Anemia -- acute on chronic -To blood in stool with lower GI bleed and multiple lab draws, stable overall.  No transfusions needed.   Chronic pain P: -holding home meds w encephalopathy     Physical debility, deconditioning P: -PT/OT, discharged back to SNF.   Discharge diagnosis     Principal Problem:   Septic shock (HCC) Active Problems:   Acute on chronic respiratory failure with hypoxia and hypercapnia (HCC)   Acute metabolic encephalopathy   Proctocolitis   Encephalopathy acute   Sepsis with encephalopathy and septic shock (HCC)   Gastrointestinal hemorrhage   ABLA (acute blood loss anemia)   Heme positive stool   Abnormal CT scan, colon   Benign neoplasm of transverse colon   Benign neoplasm of descending colon   Benign neoplasm of sigmoid colon    Discharge instructions    Discharge Instructions     Discharge instructions   Complete by: As directed    Follow with Primary MD Merrilyn Puma, DO in 7 days   Get CBC, CMP, 2 view Chest X ray -  checked next visit with your primary MD or SNF MD   Activity: As tolerated with Full fall precautions use walker/cane & assistance as needed  Disposition SNF  Diet: Heart Healthy Low Carb. Check CBGs QAC-HS  Special Instructions: If you have smoked or chewed Tobacco  in the last 2 yrs please stop smoking, stop any regular Alcohol  and or any Recreational drug use.  On your next visit with your primary care physician please Get Medicines reviewed and adjusted.  Please request your Prim.MD to go over all Hospital Tests and Procedure/Radiological results at the follow up, please get all Hospital records sent to your Prim MD by signing hospital release before you go home.  If you experience worsening of your admission symptoms, develop shortness of breath, life threatening emergency, suicidal or homicidal thoughts you must seek medical attention immediately by calling 911 or calling your MD immediately  if symptoms less severe.  You Must read complete instructions/literature along with all the possible adverse reactions/side effects for all the Medicines you take and that have been prescribed to you. Take any  new Medicines after you have completely understood and accpet all the possible adverse reactions/side effects.   Increase activity slowly   Complete by: As directed    No wound care   Complete by: As directed        Discharge Medications   Allergies as of 08/30/2022   No Known Allergies      Medication List     STOP taking these medications    azithromycin 250  MG tablet Commonly known as: ZITHROMAX       TAKE these medications    acetaminophen 500 MG tablet Commonly known as: TYLENOL Take 2 tablets (1,000 mg total) by mouth every 8 (eight) hours. What changed: when to take this   albuterol 108 (90 Base) MCG/ACT inhaler Commonly known as: VENTOLIN HFA Inhale 2 puffs into the lungs every 6 (six) hours as needed. For shortness of breath What changed:  reasons to take this additional instructions   amLODipine 10 MG tablet Commonly known as: NORVASC Take 10 mg by mouth daily.   amoxicillin-clavulanate 500-125 MG tablet Commonly known as: AUGMENTIN Take 1 tablet by mouth every 8 (eight) hours.   Anti-Fungal 1 % powder Generic drug: tolnaftate Apply 1 Application topically See admin instructions. Apply under the abdominal fold every day and night shift   Artificial Tears 1.4 % ophthalmic solution Generic drug: polyvinyl alcohol Place 1 drop into both eyes every 2 (two) hours as needed for dry eyes.   aspirin EC 81 MG tablet Take 1 tablet (81 mg total) by mouth daily. Start taking on: Sep 03, 2022 What changed: These instructions start on Sep 03, 2022. If you are unsure what to do until then, ask your doctor or other care provider.   atorvastatin 20 MG tablet Commonly known as: LIPITOR Take 20 mg by mouth at bedtime.   cyanocobalamin 500 MCG tablet Commonly known as: VITAMIN B12 Take 1 tablet (500 mcg total) by mouth daily.   DESITIN EX Apply 1 application  topically See admin instructions. Apply to sacrum every day and night shift   docusate sodium 100  MG capsule Commonly known as: COLACE Take 100 mg by mouth in the morning and at bedtime.   folic acid 1 MG tablet Commonly known as: FOLVITE Take 1 tablet (1 mg total) by mouth daily. What changed: when to take this   furosemide 40 MG tablet Commonly known as: LASIX Take 40 mg by mouth 2 (two) times daily.   gabapentin 100 MG capsule Commonly known as: NEURONTIN Take 100 mg by mouth 3 (three) times daily.   ipratropium-albuterol 0.5-2.5 (3) MG/3ML Soln Commonly known as: DUONEB Take 3 mLs by nebulization every 6 (six) hours as needed for shortness of breath or wheezing (or asthma).   lisinopril 5 MG tablet Commonly known as: ZESTRIL Take 5 mg by mouth See admin instructions. Take 5 mg by mouth in the morning and hold for a Systolic reading less than 110   metFORMIN 500 MG tablet Commonly known as: GLUCOPHAGE Take 500 mg by mouth daily with breakfast.   polyethylene glycol 17 g packet Commonly known as: MIRALAX / GLYCOLAX Take 17 g by mouth daily as needed for mild constipation.   pregabalin 75 MG capsule Commonly known as: LYRICA Take 1 capsule (75 mg total) by mouth 3 (three) times daily.   rOPINIRole 1 MG tablet Commonly known as: REQUIP Take 1 mg by mouth every evening.   senna 8.6 MG Tabs tablet Commonly known as: SENOKOT Take 2 tablets by mouth at bedtime.   tamsulosin 0.4 MG Caps capsule Commonly known as: FLOMAX Take 1 capsule (0.4 mg total) by mouth daily. Start taking on: Aug 31, 2022   traMADol 50 MG tablet Commonly known as: ULTRAM Take 1 tablet (50 mg total) by mouth every 12 (twelve) hours as needed. What changed:  when to take this reasons to take this   Vitamin D3 125 MCG (5000 UT) Caps Take 5,000 Units by mouth  daily.         Contact information for follow-up providers     [place], Jeanice Lim, DO. Schedule an appointment as soon as possible for a visit in 1 week(s).   Contact information: [address] [place]  [address] [phone]         [facility]. Schedule an appointment as soon as possible for a visit in 1 week(s).   Specialty: Gastroenterology Contact information: [address] [place] [address] [phone]             Contact information for after-discharge care     Destination     [facility] .   Service: Skilled Nursing Contact information: [address] [place] [address] [phone]                     Major procedures and Radiology Reports - PLEASE review detailed and final reports thoroughly  -       DG Chest Port 1 View  Result Date: 08/26/2022 CLINICAL DATA:  82 year old male with history of shortness of breath. EXAM: PORTABLE CHEST 1 VIEW COMPARISON:  Chest x-ray 08/22/2022. FINDINGS: Lung volumes are low, and image is under penetrated limiting the diagnostic sensitivity and specificity of today's examination. With these limitations in mind, there is no definite consolidative airspace disease. No pleural effusions. No pneumothorax. No pulmonary nodule or mass noted. Pulmonary vasculature and the cardiomediastinal silhouette are within normal limits. Atherosclerosis in the thoracic aorta. IMPRESSION: 1. Low lung volumes without radiographic evidence of acute cardiopulmonary disease. 2. Aortic atherosclerosis. Electronically Signed   By: Trudie Reed M.D.   On: 08/26/2022 06:53   VAS [name] LOWER EXTREMITY VENOUS (DVT)  Result Date: 08/25/2022  Lower Venous DVT Study Patient Name:  SONIA HACKL  Date of Exam:   08/24/2022 Medical Rec #: [id number]         Accession #:    [id number] Date of Birth: 07-14-1940         Patient Gender: M Patient Age:   39 years Exam Location:  [facility] Procedure:      VAS [name] LOWER EXTREMITY VENOUS (DVT) Referring Phys: Felisa Bonier --------------------------------------------------------------------------------  Indications: Edema.   Limitations: Body habitus. Comparison Study: No previous study. Performing Technologist: McKayla Maag RVT, [place]  Examination Guidelines: A complete evaluation includes B-mode imaging, spectral Doppler, color Doppler, and power Doppler as needed of all accessible portions of each vessel. Bilateral testing is considered an integral part of a complete examination. Limited examinations for reoccurring indications may be performed as noted. The reflux portion of the exam is performed with the patient in reverse Trendelenburg.  +---------+---------------+---------+-----------+----------+-------------------+ RIGHT    CompressibilityPhasicitySpontaneityPropertiesThrombus Aging      +---------+---------------+---------+-----------+----------+-------------------+ CFV      Full           Yes      Yes                                      +---------+---------------+---------+-----------+----------+-------------------+ SFJ      Full                                                             +---------+---------------+---------+-----------+----------+-------------------+  FV Prox  Full                                                             +---------+---------------+---------+-----------+----------+-------------------+ FV Mid   Full                                                             +---------+---------------+---------+-----------+----------+-------------------+ FV DistalFull                                                             +---------+---------------+---------+-----------+----------+-------------------+ PFV      Full                                                             +---------+---------------+---------+-----------+----------+-------------------+ POP      Full           Yes      Yes                                      +---------+---------------+---------+-----------+----------+-------------------+ PTV      Full                                          Not well visualized +---------+---------------+---------+-----------+----------+-------------------+ PERO                                                  Not visualized      +---------+---------------+---------+-----------+----------+-------------------+   +---------+---------------+---------+-----------+----------+-------------------+ LEFT     CompressibilityPhasicitySpontaneityPropertiesThrombus Aging      +---------+---------------+---------+-----------+----------+-------------------+ CFV      Full           Yes      Yes                                      +---------+---------------+---------+-----------+----------+-------------------+ SFJ      Full                                                             +---------+---------------+---------+-----------+----------+-------------------+ FV Prox  Full                                                             +---------+---------------+---------+-----------+----------+-------------------+  FV Mid   Full                                                             +---------+---------------+---------+-----------+----------+-------------------+ FV DistalFull                                                             +---------+---------------+---------+-----------+----------+-------------------+ PFV      Full                                                             +---------+---------------+---------+-----------+----------+-------------------+ POP      Full           Yes      Yes                                      +---------+---------------+---------+-----------+----------+-------------------+ PTV      Full                                         Not well visualized +---------+---------------+---------+-----------+----------+-------------------+ PERO                                                  Not visualized       +---------+---------------+---------+-----------+----------+-------------------+     Summary: RIGHT: - There is no evidence of deep vein thrombosis in the lower extremity. However, portions of this examination were limited- see technologist comments above.  - No cystic structure found in the popliteal fossa.  LEFT: - There is no evidence of deep vein thrombosis in the lower extremity. However, portions of this examination were limited- see technologist comments above.  - No cystic structure found in the popliteal fossa.  *See table(s) above for measurements and observations. Electronically signed by Coral Else MD on 08/25/2022 at 3:00:51 PM.    Final    ECHOCARDIOGRAM COMPLETE  Result Date: 08/24/2022    ECHOCARDIOGRAM REPORT   Patient Name:   FREEMAN ZESATI Date of Exam: 08/24/2022 Medical Rec #:  [id number]        Height:       72.0 in Accession #:    [id number]       Weight:       351.2 lb Date of Birth:  08/19/1940        BSA:          2.707 m Patient Age:    81 years         BP:           87/64 mmHg Patient Gender: M  HR:           67 bpm. Exam Location:  Inpatient Procedure: 2D Echo, Cardiac Doppler, Color Doppler and Intracardiac            Opacification Agent Indications:    Shock R57.9  History:        Patient has prior history of Echocardiogram examinations, most                 recent 07/09/2020. CHF, Signs/Symptoms:Syncope; Risk                 Factors:Hypertension, Sleep Apnea, Diabetes and Dyslipidemia.  Sonographer:    Lucendia Herrlich Referring Phys: [id number] Ivor Costa MEIER  Sonographer Comments: Technically difficult study due to poor echo windows, suboptimal parasternal window, suboptimal apical window, suboptimal subcostal window and patient is obese. IMPRESSIONS  1. Left ventricular ejection fraction, by estimation, is 60 to 65%. The left ventricle has normal function. The left ventricle has no regional wall motion abnormalities. Left ventricular diastolic parameters are  consistent with Grade I diastolic dysfunction (impaired relaxation).  2. Right ventricular systolic function is normal. The right ventricular size is normal.  3. The mitral valve is normal in structure. No evidence of mitral valve regurgitation. No evidence of mitral stenosis.  4. The aortic valve has an indeterminant number of cusps. Aortic valve regurgitation is not visualized. No aortic stenosis is present.  5. Pulmonic valve regurgitation not well visualized.  6. The inferior vena cava is dilated in size with <50% respiratory variability, suggesting right atrial pressure of 15 mmHg. FINDINGS  Left Ventricle: Left ventricular ejection fraction, by estimation, is 60 to 65%. The left ventricle has normal function. The left ventricle has no regional wall motion abnormalities. Definity contrast agent was given IV to delineate the left ventricular  endocardial borders. The left ventricular internal cavity size was normal in size. There is no left ventricular hypertrophy. Left ventricular diastolic function could not be evaluated due to atrial fibrillation. Left ventricular diastolic parameters are  consistent with Grade I diastolic dysfunction (impaired relaxation). Right Ventricle: The right ventricular size is normal. Right ventricular systolic function is normal. Left Atrium: Left atrial size was normal in size. Right Atrium: Right atrial size was normal in size. Pericardium: Trivial pericardial effusion is present. Mitral Valve: The mitral valve is normal in structure. No evidence of mitral valve regurgitation. No evidence of mitral valve stenosis. Tricuspid Valve: The tricuspid valve is normal in structure. Tricuspid valve regurgitation is not demonstrated. No evidence of tricuspid stenosis. Aortic Valve: The aortic valve has an indeterminant number of cusps. Aortic valve regurgitation is not visualized. No aortic stenosis is present. Aortic valve peak gradient measures 11.4 mmHg. Pulmonic Valve: The pulmonic  valve was not well visualized. Pulmonic valve regurgitation not well visualized. Aorta: The aortic root is normal in size and structure. Venous: The inferior vena cava is dilated in size with less than 50% respiratory variability, suggesting right atrial pressure of 15 mmHg. IAS/Shunts: No atrial level shunt detected by color flow Doppler.  LEFT VENTRICLE PLAX 2D LVIDd:         4.20 cm   Diastology LVIDs:         2.70 cm   LV e' medial:    6.84 cm/s LV PW:         1.60 cm   LV E/e' medial:  13.8 LV IVS:        1.10 cm   LV e' lateral:   9.32 cm/s LVOT  diam:     2.40 cm   LV E/e' lateral: 10.1 LV SV:         81 LV SV Index:   30 LVOT Area:     4.52 cm  RIGHT VENTRICLE             IVC RV S prime:     17.10 cm/s  IVC diam: 2.20 cm TAPSE (M-mode): 1.5 cm AORTIC VALVE AV Area (Vmax): 2.56 cm AV Vmax:        169.00 cm/s AV Peak Grad:   11.4 mmHg LVOT Vmax:      95.72 cm/s LVOT Vmean:     63.140 cm/s LVOT VTI:       0.180 m  AORTA Ao Root diam: 3.50 cm Ao Asc diam:  3.20 cm MITRAL VALVE MV Area (PHT): 3.91 cm     SHUNTS MV Decel Time: 194 msec     Systemic VTI:  0.18 m MV E velocity: 94.30 cm/s   Systemic Diam: 2.40 cm MV A velocity: 102.00 cm/s MV E/A ratio:  0.92 Olga Millers MD Electronically signed by Olga Millers MD Signature Date/Time: 08/24/2022/3:04:02 PM    Final    CT ABDOMEN PELVIS WO CONTRAST  Result Date: 08/22/2022 CLINICAL DATA:  Left lower quadrant pain. New renal dysfunction. Concern for GI bleed. EXAM: CT ABDOMEN AND PELVIS WITHOUT CONTRAST TECHNIQUE: Multidetector CT imaging of the abdomen and pelvis was performed following the standard protocol without IV contrast. RADIATION DOSE REDUCTION: This exam was performed according to the departmental dose-optimization program which includes automated exposure control, adjustment of the mA and/or kV according to patient size and/or use of iterative reconstruction technique. COMPARISON:  CT renal stone protocol 07/06/2020 FINDINGS: Lower chest: Bibasilar  atelectasis/scarring.  No acute abnormality. Hepatobiliary: Unremarkable noncontrast appearance of the liver. No radiopaque stones. No biliary dilation. Pancreas: Unremarkable. Spleen: Unremarkable. Adrenals/Urinary Tract: Normal adrenal glands. Bilateral cortical renal scarring. No urinary calculi or hydronephrosis. Bladder is unremarkable. Stomach/Bowel: There is wall thickening of the distal sigmoid colon and rectum with adjacent pericolonic fat stranding. Normal caliber large and small bowel. Normal appendix. Stomach is within normal limits. Vascular/Lymphatic: Aortic atherosclerotic calcification. No lymphadenopathy. Reproductive: Unremarkable. Other: Trace free fluid in the pelvis. No free intraperitoneal air. No organized fluid collection or abscess. Fat containing umbilical hernia. Musculoskeletal: No acute fracture. Chronic superior endplate compression fracture of T12. IMPRESSION: Findings are consistent with proctocolitis, favored infectious or inflammatory. Active GI bleeding can not be assessed for on noncontrast exam. Colonoscopy is recommended after resolution of symptoms to exclude underlying mass. Aortic Atherosclerosis (ICD10-I70.0). Electronically Signed   By: Minerva Fester M.D.   On: 08/22/2022 20:24   DG Chest Portable 1 View  Result Date: 08/22/2022 CLINICAL DATA:  morbidly obese, on O2 EXAM: PORTABLE CHEST 1 VIEW COMPARISON:  CXR 09/09/21 FINDINGS: No pleural effusion. No pneumothorax. Borderline cardiomegaly. No radiographically apparent displaced rib fractures. Visualized upper abdomen is unremarkable. Lateral view is essentially nondiagnostic due to superimposition of soft tissues. Within this limitation, vertebral body heights are preserved. Asymmetric elevation of the right hemidiaphragm. Aortic atherosclerotic calcifications. IMPRESSION: 1. No focal airspace opacity. 2. Borderline cardiomegaly. Electronically Signed   By: Lorenza Cambridge M.D.   On: 08/22/2022 17:12    Micro Results     Recent Results (from the past 240 hour(s))  Blood culture (routine x 2)     Status: Abnormal (Preliminary result)   Collection Time: 08/22/22  6:09 PM   Specimen: BLOOD  Result Value Ref Range  Status   Specimen Description BLOOD LEFT ANTECUBITAL  Final   Special Requests   Final    BOTTLES DRAWN AEROBIC AND ANAEROBIC Blood Culture adequate volume   Culture  Setup Time   Final    GRAM POSITIVE COCCI IN CLUSTERS ANAEROBIC BOTTLE ONLY CRITICAL RESULT CALLED TO, READ BACK BY AND VERIFIED WITH: PHARMD ERIN WILLIAMSON ON 08/23/22 @ 1850 BY DRT    Culture (A)  Final    STAPHYLOCOCCUS HAEMOLYTICUS Sent to [facility] for further susceptibility testing. Performed at [facility], [address], [place], [place] [address]    Report Status PENDING  Incomplete  Blood culture (routine x 2)     Status: None   Collection Time: 08/22/22  6:09 PM   Specimen: BLOOD RIGHT HAND  Result Value Ref Range Status   Specimen Description BLOOD RIGHT HAND  Final   Special Requests   Final    BOTTLES DRAWN AEROBIC AND ANAEROBIC Blood Culture results may not be optimal due to an excessive volume of blood received in culture bottles   Culture   Final    NO GROWTH 5 DAYS Performed at [facility] [facility], [address], [place], [place] [address]    Report Status 08/27/2022 FINAL  Final  Blood Culture ID Panel (Reflexed)     Status: Abnormal   Collection Time: 08/22/22  6:09 PM  Result Value Ref Range Status   Enterococcus faecalis NOT DETECTED NOT DETECTED Final   Enterococcus Faecium NOT DETECTED NOT DETECTED Final   Listeria monocytogenes NOT DETECTED NOT DETECTED Final   Staphylococcus species DETECTED (A) NOT DETECTED Final    Comment: CRITICAL RESULT CALLED TO, READ BACK BY AND VERIFIED WITH: PHARMD ERIN WILLIAMSON ON 08/23/22 @ 1850 BY DRT    Staphylococcus aureus (BCID) NOT DETECTED NOT DETECTED Final   Staphylococcus epidermidis NOT DETECTED NOT DETECTED Final   Staphylococcus lugdunensis NOT  DETECTED NOT DETECTED Final   Streptococcus species NOT DETECTED NOT DETECTED Final   Streptococcus agalactiae NOT DETECTED NOT DETECTED Final   Streptococcus pneumoniae NOT DETECTED NOT DETECTED Final   Streptococcus pyogenes NOT DETECTED NOT DETECTED Final   A.calcoaceticus-baumannii NOT DETECTED NOT DETECTED Final   Bacteroides fragilis NOT DETECTED NOT DETECTED Final   Enterobacterales NOT DETECTED NOT DETECTED Final   Enterobacter cloacae complex NOT DETECTED NOT DETECTED Final   Escherichia coli NOT DETECTED NOT DETECTED Final   Klebsiella aerogenes NOT DETECTED NOT DETECTED Final   Klebsiella oxytoca NOT DETECTED NOT DETECTED Final   Klebsiella pneumoniae NOT DETECTED NOT DETECTED Final   Proteus species NOT DETECTED NOT DETECTED Final   Salmonella species NOT DETECTED NOT DETECTED Final   Serratia marcescens NOT DETECTED NOT DETECTED Final   Haemophilus influenzae NOT DETECTED NOT DETECTED Final   Neisseria meningitidis NOT DETECTED NOT DETECTED Final   Pseudomonas aeruginosa NOT DETECTED NOT DETECTED Final   Stenotrophomonas maltophilia NOT DETECTED NOT DETECTED Final   Candida albicans NOT DETECTED NOT DETECTED Final   Candida auris NOT DETECTED NOT DETECTED Final   Candida glabrata NOT DETECTED NOT DETECTED Final   Candida krusei NOT DETECTED NOT DETECTED Final   Candida parapsilosis NOT DETECTED NOT DETECTED Final   Candida tropicalis NOT DETECTED NOT DETECTED Final   Cryptococcus neoformans/gattii NOT DETECTED NOT DETECTED Final    Comment: Performed at [facility], [address], [place], [place] [address]  MRSA Next Gen by PCR, Nasal     Status: Abnormal   Collection Time: 08/22/22  10:39 PM   Specimen: Nasal Mucosa; Nasal Swab  Result Value Ref Range Status   MRSA by PCR Next Gen DETECTED (A) NOT DETECTED Final    Comment: RESULTS CALLED TO,READ BACK BY AND VERIFIED WITH RN B.SCOTT ON 08/23/22 AT 0424 BY NM (NOTE) The GeneXpert MRSA Assay (FDA approved for  NASAL specimens only), is one component of a comprehensive MRSA colonization surveillance program. It is not intended to diagnose MRSA infection nor to guide or monitor treatment for MRSA infections. Test performance is not FDA approved in patients less than 20 years old. Performed at [facility], [address], [place], [place] [address]   Culture, blood (Routine X 2) w Reflex to ID Panel     Status: None (Preliminary result)   Collection Time: 08/26/22 12:30 PM   Specimen: BLOOD RIGHT HAND  Result Value Ref Range Status   Specimen Description BLOOD RIGHT HAND  Final   Special Requests   Final    BOTTLES DRAWN AEROBIC AND ANAEROBIC Blood Culture adequate volume   Culture   Final    NO GROWTH 4 DAYS Performed at [facility], [address], [place], [place] [address]    Report Status PENDING  Incomplete  Culture, blood (Routine X 2) w Reflex to ID Panel     Status: None (Preliminary result)   Collection Time: 08/26/22 12:32 PM   Specimen: BLOOD RIGHT HAND  Result Value Ref Range Status   Specimen Description BLOOD RIGHT HAND  Final   Special Requests   Final    BOTTLES DRAWN AEROBIC ONLY Blood Culture adequate volume   Culture   Final    NO GROWTH 4 DAYS Performed at [facility], [address] [place], [place] [address]    Report Status PENDING  Incomplete    Today   Subjective    Stelmo Cattanach today has no headache,no chest abdominal pain,no new weakness tingling or numbness, feels much better wants to go home today.    Objective   Blood pressure (!) 134/45, pulse 65, temperature 98.7 F (37.1 C), temperature source Oral, resp. rate 18, height 6' (1.829 m), weight (!) 150.7 kg, SpO2 93 %.   Intake/Output Summary (Last 24 hours) at 08/30/2022 1152 Last data filed at 08/30/2022 1000 Gross per 24 hour  Intake 360 ml  Output --  Net 360 ml    Exam  Awake Alert, No new F.N deficits,    [place].AT,PERRAL Supple Neck,   Symmetrical Chest wall  movement, Good air movement bilaterally, CTAB RRR,No Gallops,   +ve B.Sounds, Abd Soft, Non tender,  No Cyanosis, Clubbing or edema    Data Review   Recent Labs  Lab 08/26/22 0628 08/27/22 0248 08/28/22 0442 08/29/22 0358 08/30/22 0607  WBC 9.3 7.0 9.3 9.8 8.4  HGB 9.4* 9.4* 9.7* 10.0* 10.0*  HCT 29.1* 28.7* 29.4* 30.8* 30.4*  PLT 283 293 299 336 323  MCV 89.3 87.0 86.2 87.0 87.6  MCH 28.8 28.5 28.4 28.2 28.8  MCHC 32.3 32.8 33.0 32.5 32.9  RDW 13.4 13.5 13.7 13.8 14.4  LYMPHSABS  --  1.3 1.9 1.9 1.9  MONOABS  --  0.7 0.9 1.0 0.9  EOSABS  --  0.4 0.4 0.3 0.2  BASOSABS  --  0.1 0.1 0.1 0.1    Recent Labs  Lab 08/24/22 0648 08/25/22 0038 08/26/22 0628 08/27/22 0248 08/28/22 0442 08/29/22 0358 08/30/22 0607  NA 135 136 140 140 138 140 141  K 5.5*  4.4 3.8 3.5 3.6 3.5 3.8  CL 103 104 104 107 106 106 109  CO2 22 24 23 24 23  21* 20*  ANIONGAP 10 8 13 9 9 13 12   GLUCOSE 101* 101* 127* 116* 92 89 103*  BUN 75* 65* 34* 19 13 12 16   CREATININE 3.05* 2.44* 1.64* 1.40* 1.41* 1.54* 1.73*  AST 20 16  --   --   --   --   --   ALT 18 15  --   --   --   --   --   ALKPHOS 58 56  --   --   --   --   --   BILITOT 0.6 0.8  --   --   --   --   --   ALBUMIN 2.7* 2.5*  --   --   --   --   --   CRP  --   --  13.7* 9.7* 6.0* 4.8*  --   PROCALCITON  --   --  0.17 0.11 <0.10 <0.10  --   INR  --   --  1.2  --   --   --   --   AMMONIA  --   --  46*  --   --   --   --   BNP  --   --  345.3* 55.6 39.5 30.3 20.8  MG  --   --  2.0 2.0 2.0 1.9  --   CALCIUM 8.7* 8.5* 9.2 8.9 8.8* 8.8* 8.9    Total Time in preparing paper work, data evaluation and todays exam - 35 minutes  Signature  -    Susa Raring M.D on 08/30/2022 at 11:52 AM   -  To page go to [email or web address]

## 2022-08-30 NOTE — TOC Transition Note (Signed)
Transition of Care [facility]) - CM/SW Discharge Note   Patient Details  Name: Brian Silva MRN: [id number] Date of Birth: [birth year]  [facility] CM/SW Contact:  Mearl Latin, LCSW Phone Number: 08/30/2022, 3:01 PM   Clinical Narrative:    Patient will DC to: [facility] Anticipated DC date: 08/30/22 Family notified: Pt notifying family Transport by: Sharin Mons   Per MD patient ready for DC to [facility]. RN to call report prior to discharge [phone] room 204A). RN, patient, patient's family, and facility notified of DC. Discharge Summary and FL2 sent to facility. DC packet on chart including signed scripts. Ambulance transport requested for patient.   CSW will sign off for now as social work intervention is no longer needed. Please consult [name] again if new needs arise.     Final next level of care: Skilled Nursing Facility Barriers to Discharge: Barriers Resolved   Patient Goals and CMS Choice CMS Medicare.gov Compare Post Acute Care list provided to:: Patient Choice offered to / list presented to : Patient  Discharge Placement     Existing PASRR number confirmed : 08/30/22          Patient chooses bed at: [facility] Patient to be transferred to facility by: [facility] Name of family member notified: NA Patient and family notified of of transfer: 08/30/22  Discharge Plan and Services Additional resources added to the After Visit Summary for   In-house Referral: Clinical Social Work   Post Acute Care Choice: Skilled Nursing Facility                               Social Determinants of Health (SDOH) Interventions SDOH Screenings   Food Insecurity: Unknown (01/29/2019)  Transportation Needs: Unknown (01/29/2019)  Financial Resource Strain: Low Risk  (01/29/2019)  Physical Activity: Unknown (01/29/2019)  Social Connections: Unknown (01/29/2019)  Stress: No Stress Concern Present (01/29/2019)  Tobacco Use: Low Risk  (08/29/2022)     Readmission Risk  Interventions     No data to display

## 2022-08-30 NOTE — Discharge Instructions (Signed)
Follow with Primary MD Merrilyn Puma, DO in 7 days   Get CBC, CMP, 2 view Chest X ray -  checked next visit with your primary MD or SNF MD   Activity: As tolerated with Full fall precautions use walker/cane & assistance as needed  Disposition SNF  Diet: Heart Healthy Low Carb. Check CBGs QAC-HS  Special Instructions: If you have smoked or chewed Tobacco  in the last 2 yrs please stop smoking, stop any regular Alcohol  and or any Recreational drug use.  On your next visit with your primary care physician please Get Medicines reviewed and adjusted.  Please request your Prim.MD to go over all Hospital Tests and Procedure/Radiological results at the follow up, please get all Hospital records sent to your Prim MD by signing hospital release before you go home.  If you experience worsening of your admission symptoms, develop shortness of breath, life threatening emergency, suicidal or homicidal thoughts you must seek medical attention immediately by calling 911 or calling your MD immediately  if symptoms less severe.  You Must read complete instructions/literature along with all the possible adverse reactions/side effects for all the Medicines you take and that have been prescribed to you. Take any new Medicines after you have completely understood and accpet all the possible adverse reactions/side effects.

## 2022-08-31 ENCOUNTER — Encounter: Payer: Self-pay | Admitting: Internal Medicine

## 2022-08-31 DIAGNOSIS — J9622 Acute and chronic respiratory failure with hypercapnia: Secondary | ICD-10-CM | POA: Diagnosis not present

## 2022-08-31 DIAGNOSIS — K6289 Other specified diseases of anus and rectum: Secondary | ICD-10-CM | POA: Diagnosis not present

## 2022-08-31 DIAGNOSIS — J9621 Acute and chronic respiratory failure with hypoxia: Secondary | ICD-10-CM | POA: Diagnosis not present

## 2022-08-31 DIAGNOSIS — G9341 Metabolic encephalopathy: Secondary | ICD-10-CM | POA: Diagnosis not present

## 2022-08-31 LAB — CULTURE, BLOOD (ROUTINE X 2)

## 2022-09-02 ENCOUNTER — Encounter (HOSPITAL_COMMUNITY): Payer: Self-pay | Admitting: Internal Medicine

## 2022-09-03 DIAGNOSIS — E119 Type 2 diabetes mellitus without complications: Secondary | ICD-10-CM | POA: Diagnosis not present

## 2022-09-03 DIAGNOSIS — I10 Essential (primary) hypertension: Secondary | ICD-10-CM | POA: Diagnosis not present

## 2022-09-03 LAB — SUSCEPTIBILITY RESULT

## 2022-09-03 LAB — SUSCEPTIBILITY, AER + ANAEROB

## 2022-09-04 DIAGNOSIS — N179 Acute kidney failure, unspecified: Secondary | ICD-10-CM | POA: Diagnosis not present

## 2022-09-04 DIAGNOSIS — D72829 Elevated white blood cell count, unspecified: Secondary | ICD-10-CM | POA: Diagnosis not present

## 2022-09-05 DIAGNOSIS — G4733 Obstructive sleep apnea (adult) (pediatric): Secondary | ICD-10-CM | POA: Diagnosis not present

## 2022-09-05 DIAGNOSIS — Z9981 Dependence on supplemental oxygen: Secondary | ICD-10-CM | POA: Diagnosis not present

## 2022-09-05 DIAGNOSIS — K625 Hemorrhage of anus and rectum: Secondary | ICD-10-CM | POA: Diagnosis not present

## 2022-09-05 DIAGNOSIS — N390 Urinary tract infection, site not specified: Secondary | ICD-10-CM | POA: Diagnosis not present

## 2022-09-05 DIAGNOSIS — J9622 Acute and chronic respiratory failure with hypercapnia: Secondary | ICD-10-CM | POA: Diagnosis not present

## 2022-09-05 LAB — CULTURE, BLOOD (ROUTINE X 2): Special Requests: ADEQUATE

## 2022-09-06 DIAGNOSIS — D72829 Elevated white blood cell count, unspecified: Secondary | ICD-10-CM | POA: Diagnosis not present

## 2022-09-06 DIAGNOSIS — N179 Acute kidney failure, unspecified: Secondary | ICD-10-CM | POA: Diagnosis not present

## 2022-09-06 DIAGNOSIS — N390 Urinary tract infection, site not specified: Secondary | ICD-10-CM | POA: Diagnosis not present

## 2022-09-07 DIAGNOSIS — E119 Type 2 diabetes mellitus without complications: Secondary | ICD-10-CM | POA: Diagnosis not present

## 2022-09-07 DIAGNOSIS — N179 Acute kidney failure, unspecified: Secondary | ICD-10-CM | POA: Diagnosis not present

## 2022-09-07 DIAGNOSIS — D72829 Elevated white blood cell count, unspecified: Secondary | ICD-10-CM | POA: Diagnosis not present

## 2022-09-11 DIAGNOSIS — J302 Other seasonal allergic rhinitis: Secondary | ICD-10-CM | POA: Diagnosis not present

## 2022-10-11 DIAGNOSIS — K5900 Constipation, unspecified: Secondary | ICD-10-CM | POA: Diagnosis not present

## 2022-10-11 DIAGNOSIS — R140 Abdominal distension (gaseous): Secondary | ICD-10-CM | POA: Diagnosis not present

## 2022-10-11 DIAGNOSIS — R630 Anorexia: Secondary | ICD-10-CM | POA: Diagnosis not present

## 2022-10-11 DIAGNOSIS — R410 Disorientation, unspecified: Secondary | ICD-10-CM | POA: Diagnosis not present

## 2022-10-12 ENCOUNTER — Encounter (HOSPITAL_COMMUNITY): Payer: Self-pay | Admitting: Internal Medicine

## 2022-10-12 DIAGNOSIS — K5900 Constipation, unspecified: Secondary | ICD-10-CM | POA: Diagnosis not present

## 2022-10-12 DIAGNOSIS — R630 Anorexia: Secondary | ICD-10-CM | POA: Diagnosis not present

## 2022-10-12 DIAGNOSIS — R410 Disorientation, unspecified: Secondary | ICD-10-CM | POA: Diagnosis not present

## 2022-10-12 DIAGNOSIS — I509 Heart failure, unspecified: Secondary | ICD-10-CM | POA: Diagnosis not present

## 2022-10-12 DIAGNOSIS — E119 Type 2 diabetes mellitus without complications: Secondary | ICD-10-CM | POA: Diagnosis not present

## 2022-10-12 DIAGNOSIS — N390 Urinary tract infection, site not specified: Secondary | ICD-10-CM | POA: Diagnosis not present

## 2022-10-12 DIAGNOSIS — R140 Abdominal distension (gaseous): Secondary | ICD-10-CM | POA: Diagnosis not present

## 2022-10-13 ENCOUNTER — Emergency Department (HOSPITAL_COMMUNITY): Payer: Medicare Other

## 2022-10-13 ENCOUNTER — Inpatient Hospital Stay (HOSPITAL_COMMUNITY)
Admission: EM | Admit: 2022-10-13 | Discharge: 2022-10-20 | DRG: 682 | Disposition: A | Discharge status: Skilled Nursing Facility | Payer: Medicare Other | Source: Skilled Nursing Facility | Attending: Internal Medicine | Admitting: Internal Medicine

## 2022-10-13 ENCOUNTER — Encounter (HOSPITAL_COMMUNITY): Payer: Self-pay | Admitting: Emergency Medicine

## 2022-10-13 ENCOUNTER — Other Ambulatory Visit: Payer: Self-pay

## 2022-10-13 ENCOUNTER — Inpatient Hospital Stay (HOSPITAL_COMMUNITY): Payer: Medicare Other

## 2022-10-13 DIAGNOSIS — N170 Acute kidney failure with tubular necrosis: Principal | ICD-10-CM | POA: Diagnosis present

## 2022-10-13 DIAGNOSIS — R6521 Severe sepsis with septic shock: Secondary | ICD-10-CM

## 2022-10-13 DIAGNOSIS — E785 Hyperlipidemia, unspecified: Secondary | ICD-10-CM | POA: Diagnosis not present

## 2022-10-13 DIAGNOSIS — A419 Sepsis, unspecified organism: Secondary | ICD-10-CM

## 2022-10-13 DIAGNOSIS — J9611 Chronic respiratory failure with hypoxia: Secondary | ICD-10-CM | POA: Diagnosis present

## 2022-10-13 DIAGNOSIS — I5033 Acute on chronic diastolic (congestive) heart failure: Secondary | ICD-10-CM | POA: Diagnosis not present

## 2022-10-13 DIAGNOSIS — D631 Anemia in chronic kidney disease: Secondary | ICD-10-CM | POA: Diagnosis present

## 2022-10-13 DIAGNOSIS — I517 Cardiomegaly: Secondary | ICD-10-CM | POA: Diagnosis not present

## 2022-10-13 DIAGNOSIS — I10 Essential (primary) hypertension: Secondary | ICD-10-CM | POA: Diagnosis not present

## 2022-10-13 DIAGNOSIS — G9341 Metabolic encephalopathy: Secondary | ICD-10-CM | POA: Diagnosis not present

## 2022-10-13 DIAGNOSIS — I959 Hypotension, unspecified: Secondary | ICD-10-CM | POA: Diagnosis not present

## 2022-10-13 DIAGNOSIS — E871 Hypo-osmolality and hyponatremia: Secondary | ICD-10-CM | POA: Diagnosis not present

## 2022-10-13 DIAGNOSIS — J9811 Atelectasis: Secondary | ICD-10-CM | POA: Diagnosis present

## 2022-10-13 DIAGNOSIS — L89321 Pressure ulcer of left buttock, stage 1: Secondary | ICD-10-CM | POA: Diagnosis present

## 2022-10-13 DIAGNOSIS — G9349 Other encephalopathy: Secondary | ICD-10-CM | POA: Diagnosis not present

## 2022-10-13 DIAGNOSIS — Z7401 Bed confinement status: Secondary | ICD-10-CM | POA: Diagnosis not present

## 2022-10-13 DIAGNOSIS — E669 Obesity, unspecified: Secondary | ICD-10-CM | POA: Diagnosis not present

## 2022-10-13 DIAGNOSIS — I5032 Chronic diastolic (congestive) heart failure: Secondary | ICD-10-CM | POA: Diagnosis not present

## 2022-10-13 DIAGNOSIS — K625 Hemorrhage of anus and rectum: Secondary | ICD-10-CM | POA: Diagnosis not present

## 2022-10-13 DIAGNOSIS — Z1152 Encounter for screening for COVID-19: Secondary | ICD-10-CM | POA: Diagnosis not present

## 2022-10-13 DIAGNOSIS — N179 Acute kidney failure, unspecified: Principal | ICD-10-CM

## 2022-10-13 DIAGNOSIS — N1832 Chronic kidney disease, stage 3b: Secondary | ICD-10-CM | POA: Diagnosis present

## 2022-10-13 DIAGNOSIS — N390 Urinary tract infection, site not specified: Secondary | ICD-10-CM | POA: Diagnosis not present

## 2022-10-13 DIAGNOSIS — I130 Hypertensive heart and chronic kidney disease with heart failure and stage 1 through stage 4 chronic kidney disease, or unspecified chronic kidney disease: Secondary | ICD-10-CM | POA: Diagnosis present

## 2022-10-13 DIAGNOSIS — G4733 Obstructive sleep apnea (adult) (pediatric): Secondary | ICD-10-CM | POA: Diagnosis not present

## 2022-10-13 DIAGNOSIS — I509 Heart failure, unspecified: Secondary | ICD-10-CM | POA: Diagnosis not present

## 2022-10-13 DIAGNOSIS — E875 Hyperkalemia: Secondary | ICD-10-CM | POA: Diagnosis present

## 2022-10-13 DIAGNOSIS — L89311 Pressure ulcer of right buttock, stage 1: Secondary | ICD-10-CM | POA: Diagnosis present

## 2022-10-13 DIAGNOSIS — K439 Ventral hernia without obstruction or gangrene: Secondary | ICD-10-CM | POA: Diagnosis not present

## 2022-10-13 DIAGNOSIS — Z6841 Body Mass Index (BMI) 40.0 and over, adult: Secondary | ICD-10-CM | POA: Diagnosis not present

## 2022-10-13 DIAGNOSIS — G252 Other specified forms of tremor: Secondary | ICD-10-CM | POA: Diagnosis present

## 2022-10-13 DIAGNOSIS — I5030 Unspecified diastolic (congestive) heart failure: Secondary | ICD-10-CM | POA: Diagnosis present

## 2022-10-13 DIAGNOSIS — Z743 Need for continuous supervision: Secondary | ICD-10-CM | POA: Diagnosis not present

## 2022-10-13 DIAGNOSIS — E872 Acidosis, unspecified: Secondary | ICD-10-CM | POA: Diagnosis not present

## 2022-10-13 DIAGNOSIS — E119 Type 2 diabetes mellitus without complications: Secondary | ICD-10-CM | POA: Diagnosis not present

## 2022-10-13 DIAGNOSIS — E1122 Type 2 diabetes mellitus with diabetic chronic kidney disease: Secondary | ICD-10-CM | POA: Diagnosis present

## 2022-10-13 DIAGNOSIS — G9389 Other specified disorders of brain: Secondary | ICD-10-CM | POA: Diagnosis not present

## 2022-10-13 DIAGNOSIS — N400 Enlarged prostate without lower urinary tract symptoms: Secondary | ICD-10-CM | POA: Diagnosis not present

## 2022-10-13 DIAGNOSIS — Z7982 Long term (current) use of aspirin: Secondary | ICD-10-CM

## 2022-10-13 DIAGNOSIS — R918 Other nonspecific abnormal finding of lung field: Secondary | ICD-10-CM | POA: Diagnosis not present

## 2022-10-13 DIAGNOSIS — K921 Melena: Secondary | ICD-10-CM | POA: Diagnosis not present

## 2022-10-13 DIAGNOSIS — Z9981 Dependence on supplemental oxygen: Secondary | ICD-10-CM | POA: Diagnosis not present

## 2022-10-13 DIAGNOSIS — D649 Anemia, unspecified: Secondary | ICD-10-CM | POA: Diagnosis present

## 2022-10-13 DIAGNOSIS — R4182 Altered mental status, unspecified: Secondary | ICD-10-CM | POA: Diagnosis not present

## 2022-10-13 DIAGNOSIS — I700 Atherosclerosis of aorta: Secondary | ICD-10-CM | POA: Diagnosis not present

## 2022-10-13 DIAGNOSIS — K828 Other specified diseases of gallbladder: Secondary | ICD-10-CM | POA: Diagnosis not present

## 2022-10-13 DIAGNOSIS — K5731 Diverticulosis of large intestine without perforation or abscess with bleeding: Secondary | ICD-10-CM | POA: Diagnosis not present

## 2022-10-13 DIAGNOSIS — Z79899 Other long term (current) drug therapy: Secondary | ICD-10-CM

## 2022-10-13 DIAGNOSIS — Z452 Encounter for adjustment and management of vascular access device: Secondary | ICD-10-CM | POA: Diagnosis not present

## 2022-10-13 DIAGNOSIS — E1169 Type 2 diabetes mellitus with other specified complication: Secondary | ICD-10-CM | POA: Diagnosis not present

## 2022-10-13 DIAGNOSIS — R0602 Shortness of breath: Secondary | ICD-10-CM | POA: Diagnosis not present

## 2022-10-13 DIAGNOSIS — N189 Chronic kidney disease, unspecified: Secondary | ICD-10-CM | POA: Diagnosis not present

## 2022-10-13 DIAGNOSIS — L8990 Pressure ulcer of unspecified site, unspecified stage: Secondary | ICD-10-CM | POA: Diagnosis present

## 2022-10-13 DIAGNOSIS — Z7984 Long term (current) use of oral hypoglycemic drugs: Secondary | ICD-10-CM | POA: Diagnosis not present

## 2022-10-13 DIAGNOSIS — E8721 Acute metabolic acidosis: Secondary | ICD-10-CM | POA: Diagnosis not present

## 2022-10-13 LAB — BASIC METABOLIC PANEL
Anion gap: 12 (ref 5–15)
Anion gap: 14 (ref 5–15)
BUN: 165 mg/dL — ABNORMAL HIGH (ref 8–23)
BUN: 165 mg/dL — ABNORMAL HIGH (ref 8–23)
CO2: 13 mmol/L — ABNORMAL LOW (ref 22–32)
CO2: 14 mmol/L — ABNORMAL LOW (ref 22–32)
Calcium: 7.6 mg/dL — ABNORMAL LOW (ref 8.9–10.3)
Calcium: 8 mg/dL — ABNORMAL LOW (ref 8.9–10.3)
Chloride: 112 mmol/L — ABNORMAL HIGH (ref 98–111)
Chloride: 111 mmol/L (ref 98–111)
Creatinine, Ser: 4.92 mg/dL — ABNORMAL HIGH (ref 0.61–1.24)
Creatinine, Ser: 4.74 mg/dL — ABNORMAL HIGH (ref 0.61–1.24)
GFR, Estimated: 11 mL/min — ABNORMAL LOW (ref >60)
GFR, Estimated: 12 mL/min — ABNORMAL LOW (ref >60)
Glucose, Bld: 96 mg/dL (ref 70–99)
Glucose, Bld: 92 mg/dL (ref 70–99)
Potassium: 6.3 mmol/L (ref 3.5–5.1)
Potassium: 5.4 mmol/L — ABNORMAL HIGH (ref 3.5–5.1)
Sodium: 137 mmol/L (ref 135–145)
Sodium: 139 mmol/L (ref 135–145)

## 2022-10-13 LAB — COMPREHENSIVE METABOLIC PANEL
ALT: 15 U/L (ref 0–44)
AST: 15 U/L (ref 15–41)
Albumin: 3 g/dL — ABNORMAL LOW (ref 3.5–5.0)
Alkaline Phosphatase: 49 U/L (ref 38–126)
Anion gap: 20 — ABNORMAL HIGH (ref 5–15)
BUN: 172 mg/dL — ABNORMAL HIGH (ref 8–23)
CO2: 11 mmol/L — ABNORMAL LOW (ref 22–32)
Calcium: 8.2 mg/dL — ABNORMAL LOW (ref 8.9–10.3)
Chloride: 108 mmol/L (ref 98–111)
Creatinine, Ser: 5.39 mg/dL — ABNORMAL HIGH (ref 0.61–1.24)
GFR, Estimated: 10 mL/min — ABNORMAL LOW (ref >60)
Glucose, Bld: 96 mg/dL (ref 70–99)
Potassium: 6.8 mmol/L (ref 3.5–5.1)
Sodium: 139 mmol/L (ref 135–145)
Total Bilirubin: 0.5 mg/dL (ref 0.3–1.2)
Total Protein: 6.2 g/dL — ABNORMAL LOW (ref 6.5–8.1)

## 2022-10-13 LAB — RENAL FUNCTION PANEL
Albumin: 2.7 g/dL — ABNORMAL LOW (ref 3.5–5.0)
Anion gap: 15 (ref 5–15)
BUN: 159 mg/dL — ABNORMAL HIGH (ref 8–23)
CO2: 16 mmol/L — ABNORMAL LOW (ref 22–32)
Calcium: 8.2 mg/dL — ABNORMAL LOW (ref 8.9–10.3)
Chloride: 109 mmol/L (ref 98–111)
Creatinine, Ser: 4.49 mg/dL — ABNORMAL HIGH (ref 0.61–1.24)
GFR, Estimated: 13 mL/min — ABNORMAL LOW (ref >60)
Glucose, Bld: 103 mg/dL — ABNORMAL HIGH (ref 70–99)
Phosphorus: 6.5 mg/dL — ABNORMAL HIGH (ref 2.5–4.6)
Potassium: 5.5 mmol/L — ABNORMAL HIGH (ref 3.5–5.1)
Sodium: 140 mmol/L (ref 135–145)

## 2022-10-13 LAB — I-STAT VENOUS BLOOD GAS, ED
Acid-base deficit: 12 mmol/L — ABNORMAL HIGH (ref 0.0–2.0)
Bicarbonate: 12.3 mmol/L — ABNORMAL LOW (ref 20.0–28.0)
Calcium, Ion: 0.99 mmol/L — ABNORMAL LOW (ref 1.15–1.40)
HCT: 24 % — ABNORMAL LOW (ref 39.0–52.0)
Hemoglobin: 8.2 g/dL — ABNORMAL LOW (ref 13.0–17.0)
O2 Saturation: 96 %
Potassium: 5.9 mmol/L — ABNORMAL HIGH (ref 3.5–5.1)
Sodium: 138 mmol/L (ref 135–145)
TCO2: 13 mmol/L — ABNORMAL LOW (ref 22–32)
pCO2, Ven: 22.7 mmHg — ABNORMAL LOW (ref 44–60)
pH, Ven: 7.343 (ref 7.25–7.43)
pO2, Ven: 84 mmHg — ABNORMAL HIGH (ref 32–45)

## 2022-10-13 LAB — URINALYSIS, W/ REFLEX TO CULTURE (INFECTION SUSPECTED)
Bilirubin Urine: NEGATIVE
Glucose, UA: NEGATIVE mg/dL
Hgb urine dipstick: NEGATIVE
Ketones, ur: NEGATIVE mg/dL
Leukocytes,Ua: NEGATIVE
Nitrite: NEGATIVE
Protein, ur: NEGATIVE mg/dL
Specific Gravity, Urine: 1.013 (ref 1.005–1.030)
pH: 5 (ref 5.0–8.0)

## 2022-10-13 LAB — TYPE AND SCREEN
ABO/RH(D): B POS
Antibody Screen: NEGATIVE

## 2022-10-13 LAB — BLOOD CULTURE ID PANEL (REFLEXED) - BCID2

## 2022-10-13 LAB — SARS CORONAVIRUS 2 BY RT PCR: SARS Coronavirus 2 by RT PCR: NEGATIVE

## 2022-10-13 LAB — CBC WITH DIFFERENTIAL/PLATELET
Abs Immature Granulocytes: 0.01 10*3/uL (ref 0.00–0.07)
Basophils Absolute: 0.1 10*3/uL (ref 0.0–0.1)
Basophils Relative: 1 %
Eosinophils Absolute: 0.1 10*3/uL (ref 0.0–0.5)
Eosinophils Relative: 1 %
HCT: 29.4 % — ABNORMAL LOW (ref 39.0–52.0)
Hemoglobin: 9 g/dL — ABNORMAL LOW (ref 13.0–17.0)
Immature Granulocytes: 0 %
Lymphocytes Relative: 16 %
Lymphs Abs: 0.8 10*3/uL (ref 0.7–4.0)
MCH: 29.1 pg (ref 26.0–34.0)
MCHC: 30.6 g/dL (ref 30.0–36.0)
MCV: 95.1 fL (ref 80.0–100.0)
Monocytes Absolute: 0.4 10*3/uL (ref 0.1–1.0)
Monocytes Relative: 8 %
Neutro Abs: 4 10*3/uL (ref 1.7–7.7)
Neutrophils Relative %: 74 %
Platelets: 240 10*3/uL (ref 150–400)
RBC: 3.09 MIL/uL — ABNORMAL LOW (ref 4.22–5.81)
RDW: 15.2 % (ref 11.5–15.5)
WBC: 5.4 10*3/uL (ref 4.0–10.5)
nRBC: 0 % (ref 0.0–0.2)

## 2022-10-13 LAB — GLUCOSE, CAPILLARY
Glucose-Capillary: 112 mg/dL — ABNORMAL HIGH (ref 70–99)
Glucose-Capillary: 120 mg/dL — ABNORMAL HIGH (ref 70–99)
Glucose-Capillary: 94 mg/dL (ref 70–99)

## 2022-10-13 LAB — CULTURE, BLOOD (ROUTINE X 2)

## 2022-10-13 LAB — HEMOGLOBIN AND HEMATOCRIT, BLOOD
HCT: 25.8 % — ABNORMAL LOW (ref 39.0–52.0)
Hemoglobin: 8.1 g/dL — ABNORMAL LOW (ref 13.0–17.0)

## 2022-10-13 LAB — PHOSPHORUS: Phosphorus: 7.4 mg/dL — ABNORMAL HIGH (ref 2.5–4.6)

## 2022-10-13 LAB — BRAIN NATRIURETIC PEPTIDE: B Natriuretic Peptide: 14.3 pg/mL (ref 0.0–100.0)

## 2022-10-13 LAB — POC OCCULT BLOOD, ED: Fecal Occult Bld: NEGATIVE

## 2022-10-13 LAB — HEPATITIS B SURFACE ANTIGEN: Hepatitis B Surface Ag: NONREACTIVE

## 2022-10-13 LAB — PROTIME-INR
INR: 1.2 (ref 0.8–1.2)
Prothrombin Time: 14.9 seconds (ref 11.4–15.2)

## 2022-10-13 LAB — LACTIC ACID, PLASMA
Lactic Acid, Venous: 0.7 mmol/L (ref 0.5–1.9)
Lactic Acid, Venous: 0.7 mmol/L (ref 0.5–1.9)

## 2022-10-13 LAB — CK: Total CK: 338 U/L (ref 49–397)

## 2022-10-13 LAB — LIPASE, BLOOD: Lipase: 39 U/L (ref 11–51)

## 2022-10-13 LAB — MAGNESIUM: Magnesium: 1.9 mg/dL (ref 1.7–2.4)

## 2022-10-13 LAB — AMMONIA: Ammonia: 19 umol/L (ref 9–35)

## 2022-10-13 MED ORDER — SODIUM CHLORIDE 0.9 % IV BOLUS
500.0000 mL | Freq: Once | INTRAVENOUS | Status: AC
  Administered 2022-10-13: 500 mL via INTRAVENOUS

## 2022-10-13 MED ORDER — NOREPINEPHRINE 4 MG/250ML-% IV SOLN: 2.0000 ug/min | INTRAVENOUS | Status: DC

## 2022-10-13 MED ORDER — DEXTROSE 5 % IV BOLUS: 250.0000 mL | Freq: Once | INTRAVENOUS | Status: DC

## 2022-10-13 MED ORDER — VANCOMYCIN HCL 2000 MG/400ML IV SOLN
2000.0000 mg | Freq: Once | INTRAVENOUS | Status: AC
  Administered 2022-10-13: 2000 mg via INTRAVENOUS
  Filled 2022-10-13: qty 400

## 2022-10-13 MED ORDER — SODIUM ZIRCONIUM CYCLOSILICATE 10 G PO PACK: 10.0000 g | PACK | Freq: Once | ORAL | Status: DC

## 2022-10-13 MED ORDER — ORAL CARE MOUTH RINSE: 15.0000 mL | OROMUCOSAL | Status: DC | PRN

## 2022-10-13 MED ORDER — ALBUTEROL SULFATE (2.5 MG/3ML) 0.083% IN NEBU
10.0000 mg | INHALATION_SOLUTION | Freq: Once | RESPIRATORY_TRACT | Status: AC
  Administered 2022-10-13: 10 mg via RESPIRATORY_TRACT
  Filled 2022-10-13: qty 12

## 2022-10-13 MED ORDER — FOLIC ACID 1 MG PO TABS
1.0000 mg | ORAL_TABLET | Freq: Every day | ORAL | Status: DC
  Administered 2022-10-14 – 2022-10-20 (×7): 1 mg via ORAL
  Filled 2022-10-13 (×7): qty 1

## 2022-10-13 MED ORDER — SODIUM CHLORIDE 0.9 % IV SOLN
250.0000 mL | INTRAVENOUS | Status: DC
  Administered 2022-10-13: 250 mL via INTRAVENOUS

## 2022-10-13 MED ORDER — ACETAMINOPHEN 325 MG PO TABS: 650.0000 mg | ORAL_TABLET | Freq: Four times a day (QID) | ORAL | Status: DC | PRN

## 2022-10-13 MED ORDER — ATORVASTATIN CALCIUM 10 MG PO TABS
20.0000 mg | ORAL_TABLET | Freq: Every day | ORAL | Status: DC
  Administered 2022-10-14 – 2022-10-19 (×6): 20 mg via ORAL
  Filled 2022-10-13 (×6): qty 2

## 2022-10-13 MED ORDER — ACETAMINOPHEN 650 MG RE SUPP: 650.0000 mg | Freq: Four times a day (QID) | RECTAL | Status: DC | PRN

## 2022-10-13 MED ORDER — NOREPINEPHRINE 4 MG/250ML-% IV SOLN
0.0000 ug/min | INTRAVENOUS | Status: DC
  Administered 2022-10-13 – 2022-10-14 (×2): 10 ug/min via INTRAVENOUS
  Administered 2022-10-14: 7 ug/min via INTRAVENOUS
  Filled 2022-10-13 (×3): qty 250

## 2022-10-13 MED ORDER — NOREPINEPHRINE 4 MG/250ML-% IV SOLN
INTRAVENOUS | Status: AC
  Administered 2022-10-13: 4 mg
  Filled 2022-10-13: qty 250

## 2022-10-13 MED ORDER — PRISMASOL BGK 4/2.5 32-4-2.5 MEQ/L REPLACEMENT SOLN: Status: DC

## 2022-10-13 MED ORDER — PRISMASOL BGK 4/2.5 32-4-2.5 MEQ/L EC SOLN: Status: DC

## 2022-10-13 MED ORDER — PANTOPRAZOLE SODIUM 40 MG IV SOLR
40.0000 mg | INTRAVENOUS | Status: DC
  Administered 2022-10-13 – 2022-10-17 (×5): 40 mg via INTRAVENOUS
  Filled 2022-10-13 (×5): qty 10

## 2022-10-13 MED ORDER — CHLORHEXIDINE GLUCONATE CLOTH 2 % EX PADS
6.0000 | MEDICATED_PAD | Freq: Every day | CUTANEOUS | Status: DC
  Administered 2022-10-14 – 2022-10-18 (×5): 6 via TOPICAL

## 2022-10-13 MED ORDER — SODIUM CHLORIDE 0.9 % IV BOLUS
1000.0000 mL | Freq: Once | INTRAVENOUS | Status: AC
  Administered 2022-10-13: 1000 mL via INTRAVENOUS

## 2022-10-13 MED ORDER — SODIUM BICARBONATE 8.4 % IV SOLN
50.0000 meq | Freq: Once | INTRAVENOUS | Status: AC
  Administered 2022-10-13: 50 meq via INTRAVENOUS
  Filled 2022-10-13: qty 50

## 2022-10-13 MED ORDER — CALCIUM GLUCONATE-NACL 1-0.675 GM/50ML-% IV SOLN
1.0000 g | Freq: Once | INTRAVENOUS | Status: AC
  Administered 2022-10-13: 1000 mg via INTRAVENOUS
  Filled 2022-10-13: qty 50

## 2022-10-13 MED ORDER — SODIUM BICARBONATE 8.4 % IV SOLN
INTRAVENOUS | Status: DC
  Filled 2022-10-13: qty 1000
  Filled 2022-10-13: qty 150
  Filled 2022-10-13: qty 1000

## 2022-10-13 MED ORDER — PIPERACILLIN-TAZOBACTAM 3.375 G IVPB
3.3750 g | Freq: Three times a day (TID) | INTRAVENOUS | Status: DC
  Administered 2022-10-13 – 2022-10-14 (×3): 3.375 g via INTRAVENOUS
  Filled 2022-10-13 (×3): qty 50

## 2022-10-13 MED ORDER — IPRATROPIUM-ALBUTEROL 0.5-2.5 (3) MG/3ML IN SOLN: 3.0000 mL | Freq: Four times a day (QID) | RESPIRATORY_TRACT | Status: DC | PRN

## 2022-10-13 MED ORDER — ALBUTEROL SULFATE (2.5 MG/3ML) 0.083% IN NEBU: 2.5000 mg | INHALATION_SOLUTION | Freq: Four times a day (QID) | RESPIRATORY_TRACT | Status: DC | PRN

## 2022-10-13 MED ORDER — INSULIN ASPART 100 UNIT/ML IV SOLN
10.0000 [IU] | Freq: Once | INTRAVENOUS | Status: AC
  Administered 2022-10-13: 10 [IU] via INTRAVENOUS

## 2022-10-13 MED ORDER — CALCIUM GLUCONATE-NACL 2-0.675 GM/100ML-% IV SOLN
2.0000 g | Freq: Once | INTRAVENOUS | Status: AC
  Administered 2022-10-13: 2000 mg via INTRAVENOUS
  Filled 2022-10-13: qty 100

## 2022-10-13 MED ORDER — SODIUM ZIRCONIUM CYCLOSILICATE 10 G PO PACK
10.0000 g | PACK | Freq: Once | ORAL | Status: AC
  Administered 2022-10-13: 10 g via ORAL
  Filled 2022-10-13: qty 1

## 2022-10-13 MED ORDER — ROPINIROLE HCL 1 MG PO TABS
1.0000 mg | ORAL_TABLET | Freq: Every evening | ORAL | Status: DC
  Administered 2022-10-14 – 2022-10-19 (×6): 1 mg via ORAL
  Filled 2022-10-13 (×8): qty 1

## 2022-10-13 MED ORDER — INSULIN ASPART 100 UNIT/ML IJ SOLN: 0.0000 [IU] | INTRAMUSCULAR | Status: DC

## 2022-10-13 MED ORDER — HEPARIN (PORCINE) 2000 UNITS/L FOR CRRT
INTRAVENOUS_CENTRAL | Status: DC | PRN
  Administered 2022-10-14: 2000 mL via INTRAVENOUS_CENTRAL

## 2022-10-13 MED ORDER — TAMSULOSIN HCL 0.4 MG PO CAPS
0.4000 mg | ORAL_CAPSULE | Freq: Every day | ORAL | Status: DC
  Administered 2022-10-13 – 2022-10-20 (×8): 0.4 mg via ORAL
  Filled 2022-10-13 (×8): qty 1

## 2022-10-13 MED ORDER — VITAMIN B-12 1000 MCG PO TABS
500.0000 ug | ORAL_TABLET | Freq: Every day | ORAL | Status: DC
  Administered 2022-10-14 – 2022-10-20 (×7): 500 ug via ORAL
  Filled 2022-10-13 (×7): qty 1

## 2022-10-13 MED ORDER — HEPARIN SODIUM (PORCINE) 1000 UNIT/ML DIALYSIS
1000.0000 [IU] | INTRAMUSCULAR | Status: DC | PRN
  Administered 2022-10-15: 2400 [IU] via INTRAVENOUS_CENTRAL
  Filled 2022-10-13: qty 2
  Filled 2022-10-13: qty 6

## 2022-10-13 MED ORDER — SODIUM CHLORIDE 0.9 % IV SOLN
500.0000 [IU]/h | INTRAVENOUS | Status: DC
  Administered 2022-10-13: 500 [IU]/h via INTRAVENOUS_CENTRAL
  Administered 2022-10-14 (×2): 750 [IU]/h via INTRAVENOUS_CENTRAL
  Filled 2022-10-13: qty 10000
  Filled 2022-10-13 (×2): qty 2

## 2022-10-13 MED ORDER — ASPIRIN 81 MG PO TBEC: 81.0000 mg | DELAYED_RELEASE_TABLET | Freq: Every day | ORAL | Status: DC

## 2022-10-13 MED ORDER — DEXTROSE 50 % IV SOLN: 1.0000 | INTRAVENOUS | Status: DC | PRN

## 2022-10-13 MED ORDER — SODIUM CHLORIDE 0.9% FLUSH
3.0000 mL | Freq: Two times a day (BID) | INTRAVENOUS | Status: DC
  Administered 2022-10-13 – 2022-10-19 (×12): 3 mL via INTRAVENOUS

## 2022-10-13 NOTE — Progress Notes (Signed)
There is order for 2nd PIV access. Assessed patient's both arms and there is no suitable veins for PIV access ( small & deep veins). Informed patient's RN regarding this matter. HS [employer]

## 2022-10-13 NOTE — Progress Notes (Signed)
Patient ID: Brian Silva, male   DOB: [birth year], 82 y.o.   MRN: [id number]  ED to Inpatient Handoff Report reviewed.  Lidia Collum, RN

## 2022-10-13 NOTE — ED Triage Notes (Signed)
Pt BIB EMS from [facility] facility states that he had 1 bowel movement with blood in the stool. Facility reports that pt has been altered x3 days and a new tremor. Pt c/o dizziness when moved over to bed. States the tremor is because he is cold and that he did not know he had a bloody bowel movement. Currently A&Ox4.

## 2022-10-13 NOTE — ED Notes (Signed)
Patient noted to be more alert than earlier, and airway considered patent as he has been yelling out to staff on multiple occasions. Will give lokelma as ordered.

## 2022-10-13 NOTE — Progress Notes (Signed)
   10/13/22 2301  BiPAP/CPAP/SIPAP  BiPAP/CPAP/SIPAP Pt Type Adult  BiPAP/CPAP/SIPAP DREAMSTATIOND  Mask Type Nasal mask  Mask Size Medium  Flow Rate 4 lpm  Patient Home Equipment No  Auto Titrate Yes ( and )  BiPAP/CPAP /SiPAP Vitals  Pulse Rate 74  Resp 16  SpO2 100 %  MEWS Score/Color  MEWS Score 1  MEWS Score Color Green   RT placed patient on CPAP HS. 4L O2 bleed in needed. Patient tolerating well at this time.

## 2022-10-13 NOTE — Progress Notes (Addendum)
Pharmacy Antibiotic Note  Brian Silva is a 82 y.o. male admitted on 10/13/2022 with intra-abdominal infection.  Afebrile, wbc wnl acute on chronic renal insufficiency> now on CRRT. Pharmacy has been consulted for zosyn  dosing. Called with [facility] running BCID  Last admit MRSA PCR + not done this admit yet  Will gave Vancomycin x1 tonight and follow up cx data in am  Plan: Zosyn 3.375g IV q8h (4 hour infusion). Vancomycin 2gm iv x1  Height: 6' (182.9 cm) Weight: (!) 150.7 kg (332 lb 3.7 oz) IBW/kg (Calculated) : 77.6  Temp (24hrs), Avg:97.8 F (36.6 C), Min:97.4 F (36.3 C), Max:98.4 F (36.9 C)  Recent Labs  Lab 10/13/22 0103 10/13/22 0136 10/13/22 0405 10/13/22 1138  WBC 5.4  --   --   --   CREATININE 5.39*  --  4.92* 4.74*  LATICACIDVEN  --  0.7 0.7  --     Estimated Creatinine Clearance: 19.1 mL/min (A) (by C-G formula based on SCr of 4.74 mg/dL (H)).    No Known Allergies  Antimicrobials this admission:  Dose adjustments this admission:  Microbiology results: 6/22 Bcx    Leota Sauers Pharm.D. CPP, BCPS Clinical Pharmacist [phone] 10/13/2022 4:36 PM

## 2022-10-13 NOTE — Progress Notes (Signed)
There is order for vasopressor and patient doesn't have suitable veins for USGPIV. Patient's RN informed that patient has temp HD cath with pigtail. Vasopressor is infusing via central line. HS [employer]

## 2022-10-13 NOTE — H&P (Addendum)
History and Physical    Patient: Brian Silva WUJ:[id number] DOB: 1940/11/16 DOA: 10/13/2022 DOS: the patient was seen and examined on 10/13/2022 PCP: Merrilyn Puma, DO  Patient coming from: [place] via EMS  Chief Complaint:  Chief Complaint  Patient presents with   Blood In Stools   HPI: Brian Silva is a 82 y.o. male with medical history significant of hypertension, hyperlipidemia, diastolic congestive heart failure, chronic respiratory failure, chronic kidney disease stage IIIb, morbid obesity who presented after being noted to have blood in his stools.  History is limited from the patient as he states he is unclear on why he is here and is lethargic falling back asleep.  Denies having any abdominal pain.  Records note patient had developed tremors which were new and have been reported to have 1 episode of blood with his bowel movement and increased sleepiness.  Patient had just recently been hospitalized last month after being noted to be encephalopathic developing proctocolitis with associated septic shock and hypoxic hypercapnic respiratory failure.  Patient colonoscopy Dr. Rhea Belton on 5/8 which noted multiple polyps which were removed.  Records note surgical pathology noted tubular adenomas negative for high-grade dysplasia and patchy severe acute nonspecific colitis with ulceration negative for granulomas or features of chronicity.   In the emergency department patient was noted to be afebrile with tachypnea, blood pressures as low as 87/43 with improvement after IV fluids, and O2 saturations maintained on room air.  Labs significant for hemoglobin 9, potassium 6.8, BUN 172, creatinine 5.39, anion gap 20.  Chest x-ray noted low lung volumes with mild atelectasis or infiltrate at the lung bases and cardiomegaly.  CT scan of the abdomen pelvis did not note any acute abnormality and previous rectal inflammation had been noted to have resolved.  Stool guaiac was negative. Urinalysis  did not show any significant signs for infection.  Patient has been typed and screened for possible need of blood products.  Patient had received at least 2 L of IV fluids, 2 g of calcium gluconate, albuterol neb, Lokelma 10 g, and started on a sodium bicarb drip.   Review of Systems: unable to review all systems due to the inability of the patient to answer questions. Past Medical History:  Diagnosis Date   Asthma    Chronic respiratory failure (HCC)    on 2L oxygen   Hyperlipidemia    Hypertension    Obese    OSA (obstructive sleep apnea)    Sleep study 08/2010 showed mild to moderate obstructive sleep apnea/hypopnea syndrome. However, never initiated CPAP   Venous stasis    Venous Statis Disease (03/28/2004)   Past Surgical History:  Procedure Laterality Date   BIOPSY  08/29/2022   Procedure: BIOPSY;  Surgeon: Beverley Fiedler, MD;  Location: [facility] ENDOSCOPY;  Service: Gastroenterology;;   COLONOSCOPY N/A 08/29/2022   Procedure: COLONOSCOPY;  Surgeon: Beverley Fiedler, MD;  Location: [facility] ENDOSCOPY;  Service: Gastroenterology;  Laterality: N/A;   POLYPECTOMY  08/29/2022   Procedure: POLYPECTOMY;  Surgeon: Beverley Fiedler, MD;  Location: [facility] ENDOSCOPY;  Service: Gastroenterology;;   Social History:  reports that he has never smoked. He has never used smokeless tobacco. He reports current alcohol use. He reports that he does not use drugs.  No Known Allergies  History reviewed. No pertinent family history.  Prior to Admission medications   Medication Sig Start Date End Date Taking? Authorizing Provider  acetaminophen (TYLENOL) 500 MG tablet Take 2 tablets (1,000 mg total) by mouth  every 8 (eight) hours. Patient taking differently: Take 1,000 mg by mouth every 8 (eight) hours as needed for mild pain. 09/14/19  Yes Rhetta Mura, MD  albuterol (PROVENTIL HFA;VENTOLIN HFA) 108 (90 Base) MCG/ACT inhaler Inhale 2 puffs into the lungs every 6 (six) hours as needed. For shortness of breath Patient  taking differently: Inhale 2 puffs into the lungs every 6 (six) hours as needed ("for asthma"). 10/28/15  Yes Dorothea Ogle, MD  amLODipine (NORVASC) 10 MG tablet Take 10 mg by mouth daily.   Yes [provider]  ANTI-FUNGAL 1 % powder Apply 1 Application topically See admin instructions. Apply under the abdominal fold every day and night shift   Yes [provider]  ARTIFICIAL TEARS 1.4 % ophthalmic solution Place 1 drop into both eyes every 2 (two) hours as needed for dry eyes.   Yes [provider]  aspirin EC 81 MG tablet Take 1 tablet (81 mg total) by mouth daily. 09/03/22  Yes Leroy Sea, MD  atorvastatin (LIPITOR) 20 MG tablet Take 20 mg by mouth at bedtime.   Yes [provider]  Cholecalciferol (VITAMIN D3) 125 MCG (5000 UT) CAPS Take 5,000 Units by mouth daily. 06/07/20  Yes [provider]  docusate sodium (COLACE) 100 MG capsule Take 100 mg by mouth in the morning and at bedtime.   Yes [provider]  folic acid (FOLVITE) 1 MG tablet Take 1 tablet (1 mg total) by mouth daily. 07/11/20  Yes Osvaldo Shipper, MD  furosemide (LASIX) 40 MG tablet Take 40 mg by mouth 2 (two) times daily.   Yes [provider]  gabapentin (NEURONTIN) 100 MG capsule Take 100 mg by mouth 3 (three) times daily.   Yes [provider]  ipratropium-albuterol (DUONEB) 0.5-2.5 (3) MG/3ML SOLN Take 3 mLs by nebulization every 6 (six) hours as needed for shortness of breath or wheezing (or asthma). 07/04/20  Yes [provider]  lisinopril (ZESTRIL) 5 MG tablet Take 5 mg by mouth See admin instructions. Take 5 mg by mouth in the morning and hold for a Systolic reading less than 110   Yes [provider]  metFORMIN (GLUCOPHAGE) 500 MG tablet Take 500 mg by mouth daily with breakfast.   Yes [provider]  polyethylene glycol (MIRALAX / GLYCOLAX) 17 g packet Take 17 g by mouth daily as needed for mild constipation.   Yes  [provider]  pregabalin (LYRICA) 75 MG capsule Take 1 capsule (75 mg total) by mouth 3 (three) times daily. 08/30/22  Yes Leroy Sea, MD  rOPINIRole (REQUIP) 1 MG tablet Take 1 mg by mouth every evening.   Yes [provider]  senna (SENOKOT) 8.6 MG TABS tablet Take 2 tablets by mouth at bedtime.   Yes [provider]  tamsulosin (FLOMAX) 0.4 MG CAPS capsule Take 1 capsule (0.4 mg total) by mouth daily. 08/31/22  Yes Leroy Sea, MD  traMADol (ULTRAM) 50 MG tablet Take 1 tablet (50 mg total) by mouth every 12 (twelve) hours as needed. 08/30/22  Yes Leroy Sea, MD  vitamin B-12 (CYANOCOBALAMIN) 500 MCG tablet Take 1 tablet (500 mcg total) by mouth daily. 07/11/20  Yes Osvaldo Shipper, MD  Zinc Oxide (DESITIN EX) Apply 1 application  topically See admin instructions. Apply to sacrum every shift   Yes [provider]    Physical Exam: Vitals:   10/13/22 0457 10/13/22 0515 10/13/22 0615 10/13/22 0720  BP:  90/79 95/83 Marland Kitchen)  111/99  Pulse:  88 86 97  Resp:  14 14 18   Temp: 98.2 F (36.8 C)   97.6 F (36.4 C)  TempSrc: Axillary   Oral  SpO2:  96% 98% 95%  Weight:      Height:       Constitutional: Morbidly obese male who is lethargic but easily arousable.   Eyes: PERRL, lids and conjunctivae normal ENMT: Mucous membranes are moist.  .  Neck: Increased neck circumference, supple,  Respiratory: Decreased oral aeration without significant wheezes appreciated. Cardiovascular: Regular rate and rhythm, no murmurs / rubs / gallops trace bilateral lower extremity edema.   Abdomen: Protuberant abdomen no tenderness, no masses palpated. Bowel sounds positive.  Musculoskeletal: no clubbing / cyanosis. No joint deformity upper and lower extremities. Good ROM, no contractures. Normal muscle tone.  Skin: no rashes, lesions, ulcers. No induration Neurologic: CN 2-12 grossly intact.  Strength 5/5 in all 4.  Psychiatric: Lethargic.  Patient at least  oriented to person and place.  Data Reviewed:   EKG revealed possible atrial fibrillation at 74 bpm with PVCs.  Labs, imaging, and pertinent records as noted above in HPI Assessment and Plan:  Acute kidney injury superimposed on chronic kidney disease stage IIIb Patient presents with creatinine elevated up to 5.39 with BUN 172.  Baseline creatinine previously noted to be around 1.4-7.  This is greater than 0.3 increased to suggest acute kidney injury.  After initial IV fluids therapies repeat creatinine 4.92 with BUN 165. -Admit to a progressive bed -Strict intake and output -Avoid nephrotoxic agents(lisinopril, furosemide, etc) -Add on CK -Continue sodium bicarb drip with dextrose 125 mL/h -Daily monitoring of kidney function  Hyperkalemia Acute.  Patient potassium elevated up to 6.8.  Patient has been given initial therapies including IV fluids, calcium gluconate, albuterol, and Lokelma.  Repeat check 6.3 -Continue to potassium levels and treat accordingly  Transient hypotension Initially on admission blood pressures noted to be as low as 87/43.  Blood pressure improved with IV fluids. -Hold home blood pressure regimen including amlodipine, furosemide, and lisinopril -Goal MAP greater than 65  Acute metabolic encephalopathy Patient has been noted to be encephalopathic during last hospitalization thought multifactorial in the setting of sepsis and respiratory failure with hypoxia and hypercapnia.  CT scan of the brain negative for any acute abnormality.  No clear signs for infection at this point in time. -Aspiration precautions with elevation head of bed -Neurochecks -Check venous blood gas -Hold sedating medications  Possible GI bleed Normocytic anemia  Prior to arrival patient reported to have a bloody bowel movement.  Hemoglobin 9 -> 8.2, but had been 10 prior to discharge on 5/9.  Hospital last month for a proctocolitis, but CT imaging of the abdomen and pelvis and noted  improvement in symptoms.  Stool guaiac was noted to be negative.  Suspect hemoglobin to be lower as patient appears to be dehydrated.  Patient has been typed and screened for possible need of blood products. -Continue to monitor H&H -Hold aspirin.  Determine when medically appropriate to resume -Transfuse blood products as needed for hemoglobins less than 8 g/dL  Chronic respiratory failure Obstructive sleep apnea Patient on 2 L of oxygen as needed during the day and wears CPAP at night. -Continue CPAP at night -Nasal cannula oxygen as needed maintain O2 saturation greater than 90% -DuoNebs as needed for shortness of breath/wheezing  Metabolic acidosis with elevated anion gap Resolving.  Initial CO2 11 with anion gap of 20.  Secondary to kidney  dysfunction.  Repeat BMP noted resolution of anion gap -Continue to monitor  Heart failure with preserved ejection fraction Acute on Chronic.  Patient does not appear grossly fluid overloaded.  Last EF noted to be 60-65% with grade 1 diastolic dysfunction based off echocardiogram from 5/30. -Check daily weights  Diabetes mellitus type 2, without long-term use of insulin Last available hemoglobin A1c was 6.2 on 5/2. -Hypoglycemic protocols -Hold metformin due to kidney function   BPH -Continue Flomax  Morbid obesity BMI 45.06 kg/m    GI prophylaxis: Protonix IV DVT prophylaxis: SCDs Advance Care Planning:   Code Status: Full Code    Consults: Nephrology  Family Communication: No voicemail available for contact on file listed as Pam  Severity of Illness: The appropriate patient status for this patient is INPATIENT. Inpatient status is judged to be reasonable and necessary in order to provide the required intensity of service to ensure the patient's safety. The patient's presenting symptoms, physical exam findings, and initial radiographic and laboratory data in the context of their chronic comorbidities is felt to place them at high  risk for further clinical deterioration. Furthermore, it is not anticipated that the patient will be medically stable for discharge from the hospital within 2 midnights of admission.   * I certify that at the point of admission it is my clinical judgment that the patient will require inpatient hospital care spanning beyond 2 midnights from the point of admission due to high intensity of service, high risk for further deterioration and high frequency of surveillance required.*  Author: Clydie Braun, MD 10/13/2022 8:16 AM  For on call review [email or web address].

## 2022-10-13 NOTE — ED Provider Notes (Signed)
Emergency Department Provider Note   I have reviewed the triage vital signs and the nursing notes.   HISTORY  Chief Complaint Blood In Stools   HPI Brian Silva is a 82 y.o. male with PMH reviewed presents to the ED for evaluation of weakness, AMS, and blood in the BMs today noticed by staff at his SNF.  Patient was discharged in early May after hospitalization where he was encephalopathic, developed proctocolitis and associated septic shock and hypoxic/hypercarbic respiratory failure.  He underwent colonoscopy during that hospitalization and completed a course of Augmentin with improved symptoms.  Patient denies any active abdominal pain, similar to that admit.  No known fever but staff describe "tremors" which are new. They note one episode of blood with BM today and some increased sleepiness. Patient was transferred here for evaluation in that setting. Patient was not aware of the blood in the BM. No complaints at this time.   Past Medical History:  Diagnosis Date   Asthma    Chronic respiratory failure (HCC)    on 2L oxygen   Hyperlipidemia    Hypertension    Obese    OSA (obstructive sleep apnea)    Sleep study 08/2010 showed mild to moderate obstructive sleep apnea/hypopnea syndrome. However, never initiated CPAP   Venous stasis    Venous Statis Disease (03/28/2004)    Review of Systems  Constitutional: No fever/chills Cardiovascular: Denies chest pain. Respiratory: Denies shortness of breath. Gastrointestinal: No abdominal pain.  Neurological: Negative for headaches.  ____________________________________________   PHYSICAL EXAM:  VITAL SIGNS: ED Triage Vitals  Enc Vitals Group     BP 10/13/22 0057 (!) 163/93     Pulse Rate 10/13/22 0057 74     Resp 10/13/22 0057 19     Temp 10/13/22 0057 98 F (36.7 C)     Temp Source 10/13/22 0057 Oral     SpO2 10/13/22 0055 97 %     Weight 10/13/22 0057 (!) 332 lb 3.7 oz (150.7 kg)     Height 10/13/22 0057 6'  (1.829 m)   Constitutional: Chronically ill appearing. Drowsy but awakens with voice and provides a brief/limited history.  Eyes: Conjunctivae are normal.  Head: Atraumatic. Nose: No congestion/rhinnorhea. Mouth/Throat: Mucous membranes are dry.  Neck: No stridor.  Cardiovascular: Normal rate, regular rhythm. Good peripheral circulation. Grossly normal heart sounds.   Respiratory: Normal respiratory effort.  No retractions. Lungs CTAB. Gastrointestinal: Soft and nontender. No distention. Rectal exam performed with nurse chaperone. No BRBPR or melena.  Musculoskeletal: No gross deformities of extremities. Neurologic:  Normal speech and language.  Skin:  Skin is warm, dry and intact. No rash noted.   ____________________________________________   LABS (all labs ordered are listed, but only abnormal results are displayed)  Labs Reviewed  COMPREHENSIVE METABOLIC PANEL - Abnormal; Notable for the following components:      Result Value   Potassium 6.8 (*)    CO2 11 (*)    BUN 172 (*)    Creatinine, Ser 5.39 (*)    Calcium 8.2 (*)    Total Protein 6.2 (*)    Albumin 3.0 (*)    GFR, Estimated 10 (*)    Anion gap 20 (*)    All other components within normal limits  CBC WITH DIFFERENTIAL/PLATELET - Abnormal; Notable for the following components:   RBC 3.09 (*)    Hemoglobin 9.0 (*)    HCT 29.4 (*)    All other components within normal limits  SARS CORONAVIRUS  2 BY RT PCR  CULTURE, BLOOD (ROUTINE X 2)  CULTURE, BLOOD (ROUTINE X 2)  LIPASE, BLOOD  PROTIME-INR  AMMONIA  LACTIC ACID, PLASMA  URINALYSIS, W/ REFLEX TO CULTURE (INFECTION SUSPECTED)  LACTIC ACID, PLASMA  BASIC METABOLIC PANEL  MAGNESIUM  PHOSPHORUS  MAGNESIUM  PHOSPHORUS  POC OCCULT BLOOD, ED  TYPE AND SCREEN   ____________________________________________  EKG   EKG Interpretation  Date/Time:  Saturday October 13 2022 01:56:32 EDT Ventricular Rate:  74 PR Interval:    QRS Duration: 91 QT  Interval:  374 QTC Calculation: 415 R Axis:   -6 Text Interpretation: Atrial fibrillation Ventricular premature complex Low voltage, precordial leads Confirmed by Alona Bene [phone]) on 10/13/2022 2:30:40 AM        ____________________________________________  RADIOLOGY  CT Head Wo Contrast  Result Date: 10/13/2022 CLINICAL DATA:  Altered mental status. EXAM: CT HEAD WITHOUT CONTRAST TECHNIQUE: Contiguous axial images were obtained from the base of the skull through the vertex without intravenous contrast. RADIATION DOSE REDUCTION: This exam was performed according to the departmental dose-optimization program which includes automated exposure control, adjustment of the mA and/or kV according to patient size and/or use of iterative reconstruction technique. COMPARISON:  Sep 09, 2021 FINDINGS: Brain: There is mild cerebral atrophy with widening of the extra-axial spaces and ventricular dilatation. There are areas of decreased attenuation within the white matter tracts of the supratentorial brain, consistent with microvascular disease changes. Vascular: No hyperdense vessel or unexpected calcification. Skull: Normal. Negative for fracture or focal lesion. Sinuses/Orbits: No acute finding. Other: None. IMPRESSION: 1. Generalized cerebral atrophy. 2. No acute intracranial abnormality. Electronically Signed   By: Aram Candela M.D.   On: 10/13/2022 02:48   DG Chest Portable 1 View  Result Date: 10/13/2022 CLINICAL DATA:  Shortness of breath. EXAM: PORTABLE CHEST 1 VIEW COMPARISON:  08/26/2022. FINDINGS: Heart is enlarged and the mediastinal contour is within normal limits. There is atherosclerotic calcification of the aorta. Lung volumes are low with mild atelectasis or infiltrate at the lung bases. No effusion or pneumothorax. No acute osseous abnormality. IMPRESSION: 1. Low lung volumes with mild atelectasis or infiltrate at the lung bases. 2. Cardiomegaly. Electronically Signed   By: Thornell Sartorius M.D.   On: 10/13/2022 02:19    ____________________________________________   PROCEDURES  Procedure(s) performed:   Procedures  CRITICAL CARE Performed by: Maia Plan Total critical care time: 35 minutes Critical care time was exclusive of separately billable procedures and treating other patients. Critical care was necessary to treat or prevent imminent or life-threatening deterioration. Critical care was time spent personally by me on the following activities: development of treatment plan with patient and/or surrogate as well as nursing, discussions with consultants, evaluation of patient's response to treatment, examination of patient, obtaining history from patient or surrogate, ordering and performing treatments and interventions, ordering and review of laboratory studies, ordering and review of radiographic studies, pulse oximetry and re-evaluation of patient's condition.  Alona Bene, MD Emergency Medicine  ____________________________________________   INITIAL IMPRESSION / ASSESSMENT AND PLAN / ED COURSE  Pertinent labs & imaging results that were available during my care of the patient were reviewed by me and considered in my medical decision making (see chart for details).   This patient is Presenting for Evaluation of AMS, which does require a range of treatment options, and is a complaint that involves a high risk of morbidity and mortality.  The Differential Diagnoses includes but is not exclusive to alcohol, illicit or prescription  medications, intracranial pathology such as stroke, intracerebral hemorrhage, fever or infectious causes including sepsis, hypoxemia, uremia, trauma, endocrine related disorders such as diabetes, hypoglycemia, thyroid-related diseases, etc.   Critical Interventions-    Medications  sodium zirconium cyclosilicate (LOKELMA) packet 10 g (10 g Oral Not Given 10/13/22 0313)  sodium bicarbonate 150 mEq in dextrose 5 % 1,150 mL  infusion ( Intravenous New Bag/Given 10/13/22 0341)  sodium chloride 0.9 % bolus 500 mL (0 mLs Intravenous Stopped 10/13/22 0300)  sodium chloride 0.9 % bolus 500 mL (500 mLs Intravenous New Bag/Given 10/13/22 0300)  calcium gluconate 1 g/ 50 mL sodium chloride IVPB (0 mg Intravenous Stopped 10/13/22 0327)  albuterol (PROVENTIL) (2.5 MG/3ML) 0.083% nebulizer solution 10 mg (10 mg Nebulization Given 10/13/22 0310)  sodium chloride 0.9 % bolus 1,000 mL (1,000 mLs Intravenous New Bag/Given 10/13/22 0259)    Reassessment after intervention: patient more alert after IVF. BP improved.    I did obtain Additional Historical Information from EMS.   I decided to review pertinent External Data, and in summary patient discharged on 08/30/22 with septic shock related to proctocolitis.    Clinical Laboratory Tests Ordered, included COVID negative. No leukocytosis. Mild anemia at 9.0. AKI with creatinine of 5.39 and BUN 172. K+ at 6.8. Lactic acid negative.   Radiologic Tests Ordered, included CXR. I independently interpreted the images and agree with radiology interpretation.   Cardiac Monitor Tracing which shows A fib.   Social Determinants of Health Risk patient is a non-smoker.   Consult complete with [facility]. Plan for admit.   Medical Decision Making: Summary:  Patient presents to the emergency department with altered mental status, GI bleeding.  Soft blood pressures in the emergency department but improving with IV fluids.  No SIRS.  Lower suspicion for return of proctocolitis as patient has no abdominal pain or tenderness on exam.  He does appear to have acute kidney injury which I suspect is prerenal.  BUN and potassium also significantly elevated.  Plan for IV fluids and shifting of potassium but will coordinate with nephrology.   Reevaluation with update and discussion with patient. Mental status improved. AKI noted. No active GI bleeding on exam. Plan for admit.   Patient's presentation is most  consistent with acute presentation with potential threat to life or bodily function.   Disposition: admit  ____________________________________________  FINAL CLINICAL IMPRESSION(S) / ED DIAGNOSES  Final diagnoses:  AKI (acute kidney injury) (HCC)  Hyperkalemia    Note:  This document was prepared using Dragon voice recognition software and may include unintentional dictation errors.  Alona Bene, MD, [facility] Emergency Medicine    Hemi Chacko, Arlyss Repress, MD 10/13/22 [phone]

## 2022-10-13 NOTE — ED Notes (Signed)
Based on my judgement, lokelma withheld as patient is nodding in and out and is at risk of aspiration. MD notified and is in agreeance.

## 2022-10-13 NOTE — ED Notes (Signed)
Patient notably confused and restless.

## 2022-10-13 NOTE — ED Notes (Signed)
Bladder scan showed 184 ccs, MD notified.

## 2022-10-13 NOTE — Consult Note (Addendum)
[facility] Nephrology Consultation Note  Requesting MD: Dr. Katrinka Blazing, [place] Reason for consult: AKI on CKD  HPI:  Brian Silva is a 82 y.o. male with past medical history significant for hypertension, HLD, morbid obesity, chronic diastolic CHF, chronic respiratory failure who presented from SNF for blood in the stool, seen as a consultation for AKI on CKD and hyperkalemia. The patient is quite lethargic and falling back to sleep easily.  He was alert awake but unable to have conversation because of somnolent. It seems like a he had a bowel movement with blood in the stool associated with more sleepiness and developed tremors.  The baseline mental status really unknown.  I tried to call his friend listed as a contact person but no one answered the phone. In the ER, he was noted to be afebrile, tachypneic, hypotensive with systolic blood pressure down to 70s.  The BP seems to be improved with IV fluid resuscitation.  The labs showed potassium level of 6.8, CO2 11, BUN 172, creatinine level 5.39, WBC 5.4 and hemoglobin of 9.0.  Urinalysis is bland.  Chest x-ray with cardiomegaly with low lung volume.  CT head with generalized cerebral atrophy with no acute finding.  CT abdomen pelvis with no hydronephrosis and no acute finding. In the ER he received around 2 L of NS, Lokelma, dextrose, insulin, calcium gluconate, albuterol and sodium bicarbonate.  The repeat lab with potassium level of 5.4, CO2 14, BUN 165, creatinine level 4.74. He is being admitted for further evaluation. Home medication listed as amlodipine, furosemide 40 mg twice a day, lisinopril 5 mg, metformin.  He is from nursing home.  PMHx:   Past Medical History:  Diagnosis Date   Asthma    Chronic respiratory failure (HCC)    on 2L oxygen   Hyperlipidemia    Hypertension    Obese    OSA (obstructive sleep apnea)    Sleep study 08/2010 showed mild to moderate obstructive sleep apnea/hypopnea syndrome. However,  never initiated CPAP   Venous stasis    Venous Statis Disease (03/28/2004)    Past Surgical History:  Procedure Laterality Date   BIOPSY  08/29/2022   Procedure: BIOPSY;  Surgeon: Beverley Fiedler, MD;  Location: [facility] ENDOSCOPY;  Service: Gastroenterology;;   COLONOSCOPY N/A 08/29/2022   Procedure: COLONOSCOPY;  Surgeon: Beverley Fiedler, MD;  Location: [facility] ENDOSCOPY;  Service: Gastroenterology;  Laterality: N/A;   POLYPECTOMY  08/29/2022   Procedure: POLYPECTOMY;  Surgeon: Beverley Fiedler, MD;  Location: [facility] ENDOSCOPY;  Service: Gastroenterology;;    Family Hx: History reviewed. No pertinent family history.  Social History:  reports that he has never smoked. He has never used smokeless tobacco. He reports current alcohol use. He reports that he does not use drugs.  Allergies: No Known Allergies  Medications: Prior to Admission medications   Medication Sig Start Date End Date Taking? Authorizing Provider  acetaminophen (TYLENOL) 500 MG tablet Take 2 tablets (1,000 mg total) by mouth every 8 (eight) hours. Patient taking differently: Take 1,000 mg by mouth every 8 (eight) hours as needed for mild pain. 09/14/19  Yes Rhetta Mura, MD  albuterol (PROVENTIL HFA;VENTOLIN HFA) 108 (90 Base) MCG/ACT inhaler Inhale 2 puffs into the lungs every 6 (six) hours as needed. For shortness of breath Patient taking differently: Inhale 2 puffs into the lungs every 6 (six) hours as needed ("for asthma"). 10/28/15  Yes Dorothea Ogle, MD  amLODipine (NORVASC) 10 MG tablet Take 10 mg by mouth daily.  Yes [provider]  ANTI-FUNGAL 1 % powder Apply 1 Application topically See admin instructions. Apply under the abdominal fold every day and night shift   Yes [provider]  ARTIFICIAL TEARS 1.4 % ophthalmic solution Place 1 drop into both eyes every 2 (two) hours as needed for dry eyes.   Yes [provider]  aspirin EC 81 MG tablet Take 1 tablet (81 mg total) by mouth daily. 09/03/22  Yes  Leroy Sea, MD  atorvastatin (LIPITOR) 20 MG tablet Take 20 mg by mouth at bedtime.   Yes [provider]  Cholecalciferol (VITAMIN D3) 125 MCG (5000 UT) CAPS Take 5,000 Units by mouth daily. 06/07/20  Yes [provider]  docusate sodium (COLACE) 100 MG capsule Take 100 mg by mouth in the morning and at bedtime.   Yes [provider]  folic acid (FOLVITE) 1 MG tablet Take 1 tablet (1 mg total) by mouth daily. 07/11/20  Yes Osvaldo Shipper, MD  furosemide (LASIX) 40 MG tablet Take 40 mg by mouth 2 (two) times daily.   Yes [provider]  gabapentin (NEURONTIN) 100 MG capsule Take 100 mg by mouth 3 (three) times daily.   Yes [provider]  ipratropium-albuterol (DUONEB) 0.5-2.5 (3) MG/3ML SOLN Take 3 mLs by nebulization every 6 (six) hours as needed for shortness of breath or wheezing (or asthma). 07/04/20  Yes [provider]  lisinopril (ZESTRIL) 5 MG tablet Take 5 mg by mouth See admin instructions. Take 5 mg by mouth in the morning and hold for a Systolic reading less than 110   Yes [provider]  metFORMIN (GLUCOPHAGE) 500 MG tablet Take 500 mg by mouth daily with breakfast.   Yes [provider]  polyethylene glycol (MIRALAX / GLYCOLAX) 17 g packet Take 17 g by mouth daily as needed for mild constipation.   Yes [provider]  pregabalin (LYRICA) 75 MG capsule Take 1 capsule (75 mg total) by mouth 3 (three) times daily. 08/30/22  Yes Leroy Sea, MD  rOPINIRole (REQUIP) 1 MG tablet Take 1 mg by mouth every evening.   Yes [provider]  senna (SENOKOT) 8.6 MG TABS tablet Take 2 tablets by mouth at bedtime.   Yes [provider]  tamsulosin (FLOMAX) 0.4 MG CAPS capsule Take 1 capsule (0.4 mg total) by mouth daily. 08/31/22  Yes Leroy Sea, MD  traMADol (ULTRAM) 50 MG tablet Take 1 tablet (50 mg total) by mouth every 12 (twelve) hours as needed. 08/30/22  Yes Leroy Sea, MD   vitamin B-12 (CYANOCOBALAMIN) 500 MCG tablet Take 1 tablet (500 mcg total) by mouth daily. 07/11/20  Yes Osvaldo Shipper, MD  Zinc Oxide (DESITIN EX) Apply 1 application  topically See admin instructions. Apply to sacrum every shift   Yes [provider]    I have reviewed the patient's current medications.  Labs: Renal Panel: Recent Labs  Lab 10/13/22 0103 10/13/22 0405 10/13/22 1128 10/13/22 1138  NA 139 137 138 139  K 6.8* 6.3* 5.9* 5.4*  CL 108 112*  --  111  CO2 11* 13*  --  14*  GLUCOSE 96 96  --  92  BUN 172* 165*  --  165*  CREATININE 5.39* 4.92*  --  4.74*  CALCIUM 8.2* 7.6*  --  8.0*  MG  --  1.9  --   --   PHOS  --  7.4*  --   --  CBC:    Latest Ref Rng & Units 10/13/2022   11:38 AM 10/13/2022   11:28 AM 10/13/2022    1:03 AM  CBC  WBC 4.0 - 10.5 K/uL   5.4   Hemoglobin 13.0 - 17.0 g/dL 8.1  8.2  9.0   Hematocrit 39.0 - 52.0 % 25.8  24.0  29.4   Platelets 150 - 400 K/uL   240      Anemia Panel:  Recent Labs    08/26/22 0628 08/27/22 0248 08/27/22 0426 08/28/22 0442 08/29/22 0358 08/30/22 0607 10/13/22 0103 10/13/22 1128 10/13/22 1138  HGB 9.4* 9.4*  --    < > 10.0* 10.0* 9.0* 8.2* 8.1*  MCV 89.3 87.0  --    < > 87.0 87.6 95.1  --   --   VITAMINB12 1,064*  --   --   --   --   --   --   --   --   FOLATE  --  >40.0  --   --   --   --   --   --   --   FERRITIN  --   --  403*  --   --   --   --   --   --   TIBC  --   --  209*  --   --   --   --   --   --   IRON  --   --  42*  --   --   --   --   --   --   RETICCTPCT  --  1.6  --   --   --   --   --   --   --    < > = values in this interval not displayed.    Recent Labs  Lab 10/13/22 0103  AST 15  ALT 15  ALKPHOS 49  BILITOT 0.5  PROT 6.2*  ALBUMIN 3.0*    Lab Results  Component Value Date   HGBA1C 6.2 (H) 08/23/2022    ROS: He is quite lethargic and somnolent therefore review of system is limited.  Physical Exam: Vitals:   10/13/22 1131 10/13/22 1238  BP: 115/89  (!) 129/99  Pulse: 87 85  Resp: 11 17  Temp: 98.4 F (36.9 C) (!) 97.4 F (36.3 C)  SpO2: 99% 97%     General exam: Lethargic and somnolent male lying on bed with mild body tremor. Respiratory system: Reduced breath sound bilateral, mild increased work of breathing. Cardiovascular system: S1 & S2 heard, RRR.  Bilateral pitting edema in lower extremities ++ Gastrointestinal system: Abdomen is distended, soft and nontender. Normal bowel sounds heard. Central nervous system: Somnolent, falling back to sleep easily. Extremities: Body movement/tremor noted.  Edematous Skin: No rashes, lesions or ulcers Psychiatry: Somnolent and very hard to follow command.   Assessment/Plan:  # Acute kidney injury on CKD IIIb (b/l cr 1.4-1.6): Likely ischemic ATN in the setting of hypotension (SBP 70-80) associated with the use of lisinopril, possible GI bleed etc.  CT scan without hydronephrosis.  UA bland. The patient is somnolent and having body tremor concerning for uremic encephalopathy.  CT head with no acute finding.  He has fluid overload.  At this time, he will benefit from dialysis. I have tried to call patient's contact person listed as a friend Pam.  The phone rings without answer. I have discussed with the admitting team Dr. Katrinka Blazing for possible admission to ICU. Consult  IR for placement of temporary HD catheter to start dialysis.  The catheter can be placed by PCCM if he is in the floor or in ICU. Start dialysis today and may need serial treatment.  # Severe hyperkalemia due to AKI and ACE inhibitor: Received medical treatment in ER with improvement of potassium level.  Further management as above.  # Anion gap metabolic acidosis with renal failure, also on metformin at home.  Plan to start dialysis.  # Hypotension/shock: Received fluid resuscitation in ER with improvement of BP.  Unable to use further fluid because of fluid overload on exam.  Monitor BP and close observation.  Holding  antihypertensives.  # Anemia likely due to blood loss/GI bleed: Check iron studies.  Patient is ill looking and very somnolent.  Did not know his baseline mental status however the dialysis should help any uremic encephalopathy.  We need to assess him after few treatment of dialysis to see if he can tolerate outpatient HD.  Thank you for the consult.  We will continue to follow.  Yasha Tibbett Jaynie Collins 10/13/2022, 12:47 PM  [facility].  Addendum 1:39 PM: pt with borderline low BP and now going to ICU. Staffing issue with the dialysis nurses as well. We'll plan to do CRRT. D/w ICU team, Dr. Merrily Pew. PCCM will place HD line.   Eddie North,  [facility]

## 2022-10-13 NOTE — Progress Notes (Signed)
Per RN, pt is desaturating to 70% when he falls asleep, I observed this when I was in the room (snoring, slightly obstructed and occasional apnea noted).  Pt easily wakes up and sat will immediately improve to 97-98%.    Placed pt on cpap machine per order.  Per protocol, mode was set at auto bipap d/t pt states he does not wear cpap at home and no known settings.    Currently pt appears to tol well, sat stable at 99% when sleeping, no distress noted.

## 2022-10-13 NOTE — Progress Notes (Signed)
SLP Cancellation Note  Patient Details Name: Brian Silva MRN: [id number] DOB: 1940-06-14   Cancelled treatment:       Reason Eval/Treat Not Completed: Patient at procedure or test/unavailable. Attempted swallow eval in ED; pt in procedure with IV RN who said she would need more time. WIll f/u   Ozzie Knobel, Riley Nearing 10/13/2022, 2:37 PM

## 2022-10-13 NOTE — ED Notes (Signed)
ED TO INPATIENT HANDOFF REPORT  ED Nurse Name and Phone #: Lowanda Foster [phone]  S Name/Age/Gender Brian Silva 82 y.o. male Room/Bed: 001C/001C  Code Status   Code Status: Full Code  Home/SNF/Other Skilled nursing facility Patient oriented to: self and place Is this baseline? No   Triage Complete: Triage complete  Chief Complaint AKI (acute kidney injury) [facility]) [N17.9]  Triage Note Pt BIB EMS from [facility] facility states that he had 1 bowel movement with blood in the stool. Facility reports that pt has been altered x3 days and a new tremor. Pt c/o dizziness when moved over to bed. States the tremor is because he is cold and that he did not know he had a bloody bowel movement. Currently A&Ox4.    Allergies No Known Allergies  Level of Care/Admitting Diagnosis ED Disposition     ED Disposition  Admit   Condition  --   Comment  Hospital Area: [facility] [[id number]]  Level of Care: Progressive [102]  Admit to Progressive based on following criteria: GI, ENDOCRINE disease patients with GI bleeding, acute liver failure or pancreatitis, stable with diabetic ketoacidosis or thyrotoxicosis (hypothyroid) state.  May admit patient to [facility] or [facility] if equivalent level of care is available:: No  Covid Evaluation: Asymptomatic - no recent exposure (last 10 days) testing not required  Diagnosis: AKI (acute kidney injury) [facility]) [[id number]]  Admitting Physician: Clydie Braun [[id number]]  Attending Physician: Clydie Braun [[id number]]  Certification:: I certify this patient will need inpatient services for at least 2 midnights  Estimated Length of Stay: 2          B Medical/Surgery History Past Medical History:  Diagnosis Date   Asthma    Chronic respiratory failure (HCC)    on 2L oxygen   Hyperlipidemia    Hypertension    Obese    OSA (obstructive sleep apnea)    Sleep study 08/2010 showed mild to moderate obstructive sleep  apnea/hypopnea syndrome. However, never initiated CPAP   Venous stasis    Venous Statis Disease (03/28/2004)   Past Surgical History:  Procedure Laterality Date   BIOPSY  08/29/2022   Procedure: BIOPSY;  Surgeon: Beverley Fiedler, MD;  Location: [facility] ENDOSCOPY;  Service: Gastroenterology;;   COLONOSCOPY N/A 08/29/2022   Procedure: COLONOSCOPY;  Surgeon: Beverley Fiedler, MD;  Location: [facility] ENDOSCOPY;  Service: Gastroenterology;  Laterality: N/A;   POLYPECTOMY  08/29/2022   Procedure: POLYPECTOMY;  Surgeon: Beverley Fiedler, MD;  Location: [facility] ENDOSCOPY;  Service: Gastroenterology;;     A IV Location/Drains/Wounds Patient Lines/Drains/Airways Status     Active Line/Drains/Airways     Name Placement date Placement time Site Days   Peripheral IV 10/13/22 20 G Anterior;Left;Proximal Forearm 10/13/22  0103  Forearm  less than 1   Peripheral IV 10/13/22 20 G 1.88" Anterior;Right Forearm 10/13/22  0319  Forearm  less than 1   External Urinary Catheter 10/13/22  0228  --  less than 1   Wound / Incision (Open or Dehisced) 08/23/22 Irritant Dermatitis (Moisture Associated Skin Damage) Buttocks Bilateral 08/23/22  0800  Buttocks  51            Intake/Output Last 24 hours  Intake/Output Summary (Last 24 hours) at 10/13/2022 1240 Last data filed at 10/13/2022 5784 Gross per 24 hour  Intake 2100 ml  Output --  Net 2100 ml    Labs/Imaging Results for orders placed or performed during the hospital encounter of 10/13/22 (from  the past 48 hour(s))  Comprehensive metabolic panel     Status: Abnormal   Collection Time: 10/13/22  1:03 AM  Result Value Ref Range   Sodium 139 135 - 145 mmol/L   Potassium 6.8 (HH) 3.5 - 5.1 mmol/L    Comment: CRITICAL RESULT CALLED TO, READ BACK BY AND VERIFIED WITH BRANCH, K. RN @ [phone] 10/13/22 JBUTLER   Chloride 108 98 - 111 mmol/L   CO2 11 (L) 22 - 32 mmol/L   Glucose, Bld 96 70 - 99 mg/dL    Comment: Glucose reference range applies only to samples taken after fasting for  at least 8 hours.   BUN 172 (H) 8 - 23 mg/dL   Creatinine, Ser 1.69 (H) 0.61 - 1.24 mg/dL   Calcium 8.2 (L) 8.9 - 10.3 mg/dL   Total Protein 6.2 (L) 6.5 - 8.1 g/dL   Albumin 3.0 (L) 3.5 - 5.0 g/dL   AST 15 15 - 41 U/L   ALT 15 0 - 44 U/L   Alkaline Phosphatase 49 38 - 126 U/L   Total Bilirubin 0.5 0.3 - 1.2 mg/dL   GFR, Estimated 10 (L) >60 mL/min    Comment: (NOTE) Calculated using the CKD-EPI Creatinine Equation (2021)    Anion gap 20 (H) 5 - 15    Comment: Performed at [facility] Lab, [address], [place], [place] [address]  Lipase, blood     Status: None   Collection Time: 10/13/22  1:03 AM  Result Value Ref Range   Lipase 39 11 - 51 U/L    Comment: Performed at [facility], [address] [place], [place] [address]  CBC with Differential     Status: Abnormal   Collection Time: 10/13/22  1:03 AM  Result Value Ref Range   WBC 5.4 4.0 - 10.5 K/uL   RBC 3.09 (L) 4.22 - 5.81 MIL/uL   Hemoglobin 9.0 (L) 13.0 - 17.0 g/dL   HCT 51.0 (L) 25.8 - 52.7 %   MCV 95.1 80.0 - 100.0 fL   MCH 29.1 26.0 - 34.0 pg   MCHC 30.6 30.0 - 36.0 g/dL   RDW 78.2 42.3 - 53.6 %   Platelets 240 150 - 400 K/uL   nRBC 0.0 0.0 - 0.2 %   Neutrophils Relative % 74 %   Neutro Abs 4.0 1.7 - 7.7 K/uL   Lymphocytes Relative 16 %   Lymphs Abs 0.8 0.7 - 4.0 K/uL   Monocytes Relative 8 %   Monocytes Absolute 0.4 0.1 - 1.0 K/uL   Eosinophils Relative 1 %   Eosinophils Absolute 0.1 0.0 - 0.5 K/uL   Basophils Relative 1 %   Basophils Absolute 0.1 0.0 - 0.1 K/uL   Immature Granulocytes 0 %   Abs Immature Granulocytes 0.01 0.00 - 0.07 K/uL    Comment: Performed at [facility], [address], [place], [place] [address]  Protime-INR     Status: None   Collection Time: 10/13/22  1:03 AM  Result Value Ref Range   Prothrombin Time 14.9 11.4 - 15.2 seconds   INR 1.2 0.8 - 1.2    Comment: (NOTE) INR goal varies based on device and disease states. Performed at [facility], [address], [place], [place] [address]   Type and screen [facility]     Status: None   Collection Time: 10/13/22  1:03 AM  Result Value Ref Range   ABO/RH(D) B POS  Antibody Screen NEG    Sample Expiration      10/16/2022,2359 Performed at [facility], [address] [place], [place] [address]   SARS Coronavirus 2 by RT PCR (hospital order, performed in [facility] hospital lab) *cepheid single result test* Anterior Nasal Swab     Status: None   Collection Time: 10/13/22  1:22 AM   Specimen: Anterior Nasal Swab  Result Value Ref Range   SARS Coronavirus 2 by RT PCR NEGATIVE NEGATIVE    Comment: Performed at [facility], [address], [place], [place] [address]  Ammonia     Status: None   Collection Time: 10/13/22  1:36 AM  Result Value Ref Range   Ammonia 19 9 - 35 umol/L    Comment: Performed at [facility], [address], [place], [place] [address]  Lactic acid, plasma     Status: None   Collection Time: 10/13/22  1:36 AM  Result Value Ref Range   Lactic Acid, Venous 0.7 0.5 - 1.9 mmol/L    Comment: Performed at [facility], [address], [place], [place] [address]  POC occult blood, ED Provider will collect     Status: None   Collection Time: 10/13/22  3:07 AM  Result Value Ref Range   Fecal Occult Bld NEGATIVE NEGATIVE  Lactic acid, plasma     Status: None   Collection Time: 10/13/22  4:05 AM  Result Value Ref Range   Lactic Acid, Venous 0.7 0.5 - 1.9 mmol/L    Comment: Performed at [facility], [address], [place], [place] [address]  Basic metabolic panel     Status: Abnormal   Collection Time: 10/13/22  4:05 AM  Result Value Ref Range   Sodium 137 135 - 145 mmol/L   Potassium 6.3 (HH) 3.5 - 5.1 mmol/L    Comment: CRITICAL RESULT CALLED TO, READ BACK BY AND VERIFIED WITH BRYANT,S RN @0459  10/13/22 SATRAINR   Chloride 112 (H) 98 - 111 mmol/L   CO2 13 (L) 22 - 32 mmol/L   Glucose, Bld 96 70 - 99 mg/dL     Comment: Glucose reference range applies only to samples taken after fasting for at least 8 hours.   BUN 165 (H) 8 - 23 mg/dL   Creatinine, Ser 6.57 (H) 0.61 - 1.24 mg/dL   Calcium 7.6 (L) 8.9 - 10.3 mg/dL   GFR, Estimated 11 (L) >60 mL/min    Comment: (NOTE) Calculated using the CKD-EPI Creatinine Equation (2021)    Anion gap 12 5 - 15    Comment: Performed at [facility], [address] [place], [place] [address]  Magnesium     Status: None   Collection Time: 10/13/22  4:05 AM  Result Value Ref Range   Magnesium 1.9 1.7 - 2.4 mg/dL    Comment: Performed at [facility], [address] [place], [place] [address]  Phosphorus     Status: Abnormal   Collection Time: 10/13/22  4:05 AM  Result Value Ref Range   Phosphorus 7.4 (H) 2.5 - 4.6 mg/dL    Comment: Performed at [facility] Lab, [address], [place], [place] [address]  Urinalysis, w/ Reflex to Culture (Infection Suspected) -Urine, Clean Catch     Status: Abnormal   Collection Time: 10/13/22  6:10 AM  Result Value Ref Range   Specimen Source URINE, CLEAN CATCH    Color, Urine YELLOW YELLOW  APPearance CLEAR CLEAR   Specific Gravity, Urine 1.013 1.005 - 1.030   pH 5.0 5.0 - 8.0   Glucose, UA NEGATIVE NEGATIVE mg/dL   Hgb urine dipstick NEGATIVE NEGATIVE   Bilirubin Urine NEGATIVE NEGATIVE   Ketones, ur NEGATIVE NEGATIVE mg/dL   Protein, ur NEGATIVE NEGATIVE mg/dL   Nitrite NEGATIVE NEGATIVE   Leukocytes,Ua NEGATIVE NEGATIVE   RBC / HPF 0-5 0 - 5 RBC/hpf   WBC, UA 0-5 0 - 5 WBC/hpf    Comment:        Reflex urine culture not performed if WBC <=10, OR if Squamous epithelial cells >5. If Squamous epithelial cells >5 suggest recollection.    Bacteria, UA RARE (A) NONE SEEN   Squamous Epithelial / HPF 0-5 0 - 5 /HPF   Mucus PRESENT     Comment: Performed at [facility] Lab, [address] [place], [place] [address]  I-Stat venous blood gas, [facility], [facility], [facility])     Status: Abnormal   Collection  Time: 10/13/22 11:28 AM  Result Value Ref Range   pH, Ven 7.343 7.25 - 7.43   pCO2, Ven 22.7 (L) 44 - 60 mmHg   pO2, Ven 84 (H) 32 - 45 mmHg   Bicarbonate 12.3 (L) 20.0 - 28.0 mmol/L   TCO2 13 (L) 22 - 32 mmol/L   O2 Saturation 96 %   Acid-base deficit 12.0 (H) 0.0 - 2.0 mmol/L   Sodium 138 135 - 145 mmol/L   Potassium 5.9 (H) 3.5 - 5.1 mmol/L   Calcium, Ion 0.99 (L) 1.15 - 1.40 mmol/L   HCT 24.0 (L) 39.0 - 52.0 %   Hemoglobin 8.2 (L) 13.0 - 17.0 g/dL   Sample type VENOUS   Basic metabolic panel     Status: Abnormal   Collection Time: 10/13/22 11:38 AM  Result Value Ref Range   Sodium 139 135 - 145 mmol/L   Potassium 5.4 (H) 3.5 - 5.1 mmol/L   Chloride 111 98 - 111 mmol/L   CO2 14 (L) 22 - 32 mmol/L   Glucose, Bld 92 70 - 99 mg/dL    Comment: Glucose reference range applies only to samples taken after fasting for at least 8 hours.   BUN 165 (H) 8 - 23 mg/dL   Creatinine, Ser 8.11 (H) 0.61 - 1.24 mg/dL   Calcium 8.0 (L) 8.9 - 10.3 mg/dL   GFR, Estimated 12 (L) >60 mL/min    Comment: (NOTE) Calculated using the CKD-EPI Creatinine Equation (2021)    Anion gap 14 5 - 15    Comment: Performed at [facility], [address] [place], [place] [address]  Hemoglobin and hematocrit, blood     Status: Abnormal   Collection Time: 10/13/22 11:38 AM  Result Value Ref Range   Hemoglobin 8.1 (L) 13.0 - 17.0 g/dL   HCT 29.5 (L) 62.1 - 30.8 %    Comment: Performed at [facility], [address] [place], [place] [address]   CT ABDOMEN PELVIS WO CONTRAST  Result Date: 10/13/2022 CLINICAL DATA:  Lower GI bleed. EXAM: CT ABDOMEN AND PELVIS WITHOUT CONTRAST TECHNIQUE: Multidetector CT imaging of the abdomen and pelvis was performed following the standard protocol without IV contrast. RADIATION DOSE REDUCTION: This exam was performed according to the departmental dose-optimization program which includes automated exposure control, adjustment of the mA and/or kV according to patient  size and/or use of iterative reconstruction technique. COMPARISON:  08/22/2022 FINDINGS: Lower chest: Dependent atelectasis noted in  the lower lungs. Hepatobiliary: No suspicious focal abnormality in the liver on this study without intravenous contrast. Gallbladder is distended. No intrahepatic or extrahepatic biliary dilation. Pancreas: No focal mass lesion. No dilatation of the main duct. No intraparenchymal cyst. No peripancreatic edema. Spleen: No splenomegaly. No suspicious focal mass lesion. Adrenals/Urinary Tract: No adrenal nodule or mass. Kidneys unremarkable. No evidence for hydroureter. The urinary bladder appears normal for the degree of distention. Tiny left-sided bladder wall diverticulum evident. Stomach/Bowel: Stomach is unremarkable. No gastric wall thickening. No evidence of outlet obstruction. Duodenum is normally positioned as is the ligament of Treitz. No small bowel wall thickening. No small bowel dilatation. The terminal ileum is normal. The appendix is normal. No gross colonic mass. No colonic wall thickening. Diverticular changes are noted in the left colon without evidence of diverticulitis. Vascular/Lymphatic: There is moderate atherosclerotic calcification of the abdominal aorta without aneurysm. There is no gastrohepatic or hepatoduodenal ligament lymphadenopathy. No retroperitoneal or mesenteric lymphadenopathy. No pelvic sidewall lymphadenopathy. Reproductive: The prostate gland and seminal vesicles are unremarkable. Other: No intraperitoneal free fluid. Musculoskeletal: Paraumbilical ventral hernia again noted, containing only fat No worrisome lytic or sclerotic osseous abnormality. Mild superior endplate compression deformity at T12 is stable in the interval. IMPRESSION: 1. No acute findings in the abdomen or pelvis. The circumferential wall thickening and edema/inflammation associated with the rectum on the previous study have resolved in the interval. Evaluation for lower GI  bleeding limited on noncontrast imaging. 2. Left colonic diverticulosis without diverticulitis. 3. Paraumbilical ventral hernia containing only fat. 4.  Aortic Atherosclerosis (ICD10-I70.0). Electronically Signed   By: Kennith Center M.D.   On: 10/13/2022 05:08   CT Head Wo Contrast  Result Date: 10/13/2022 CLINICAL DATA:  Altered mental status. EXAM: CT HEAD WITHOUT CONTRAST TECHNIQUE: Contiguous axial images were obtained from the base of the skull through the vertex without intravenous contrast. RADIATION DOSE REDUCTION: This exam was performed according to the departmental dose-optimization program which includes automated exposure control, adjustment of the mA and/or kV according to patient size and/or use of iterative reconstruction technique. COMPARISON:  Sep 09, 2021 FINDINGS: Brain: There is mild cerebral atrophy with widening of the extra-axial spaces and ventricular dilatation. There are areas of decreased attenuation within the white matter tracts of the supratentorial brain, consistent with microvascular disease changes. Vascular: No hyperdense vessel or unexpected calcification. Skull: Normal. Negative for fracture or focal lesion. Sinuses/Orbits: No acute finding. Other: None. IMPRESSION: 1. Generalized cerebral atrophy. 2. No acute intracranial abnormality. Electronically Signed   By: Aram Candela M.D.   On: 10/13/2022 02:48   DG Chest Portable 1 View  Result Date: 10/13/2022 CLINICAL DATA:  Shortness of breath. EXAM: PORTABLE CHEST 1 VIEW COMPARISON:  08/26/2022. FINDINGS: Heart is enlarged and the mediastinal contour is within normal limits. There is atherosclerotic calcification of the aorta. Lung volumes are low with mild atelectasis or infiltrate at the lung bases. No effusion or pneumothorax. No acute osseous abnormality. IMPRESSION: 1. Low lung volumes with mild atelectasis or infiltrate at the lung bases. 2. Cardiomegaly. Electronically Signed   By: Thornell Sartorius M.D.   On:  10/13/2022 02:19    Pending Labs Unresulted Labs (From admission, onward)     Start     Ordered   10/14/22 0500  CBC  Tomorrow morning,   R        10/13/22 0837   10/14/22 0500  Renal function panel  Daily,   R      10/13/22 5284  10/13/22 1238  CK  Add-on,   AD        10/13/22 1237   10/13/22 0122  Culture, blood (routine x 2)  BLOOD CULTURE X 2,   R      10/13/22 0123            Vitals/Pain Today's Vitals   10/13/22 0720 10/13/22 0810 10/13/22 1131 10/13/22 1238  BP: (!) 111/99 (!) 133/92 115/89 (!) 129/99  Pulse: 97 88 87 85  Resp: 18 13 11 17   Temp: 97.6 F (36.4 C)  98.4 F (36.9 C) (!) 97.4 F (36.3 C)  TempSrc: Oral  Axillary Axillary  SpO2: 95% 99% 99% 97%  Weight:      Height:      PainSc:        Isolation Precautions No active isolations  Medications Medications  sodium bicarbonate 150 mEq in dextrose 5 % 1,150 mL infusion ( Intravenous Rate/Dose Verify 10/13/22 1238)  dextrose 50 % solution 50 mL (has no administration in time range)  tamsulosin (FLOMAX) capsule 0.4 mg (0.4 mg Oral Given 10/13/22 1032)  sodium chloride flush (NS) 0.9 % injection 3 mL (3 mLs Intravenous Given 10/13/22 1032)  acetaminophen (TYLENOL) tablet 650 mg (has no administration in time range)    Or  acetaminophen (TYLENOL) suppository 650 mg (has no administration in time range)  albuterol (PROVENTIL) (2.5 MG/3ML) 0.083% nebulizer solution 2.5 mg (has no administration in time range)  pantoprazole (PROTONIX) injection 40 mg (40 mg Intravenous Given 10/13/22 1031)  calcium gluconate 2 g/ 100 mL sodium chloride IVPB (has no administration in time range)  sodium chloride 0.9 % bolus 500 mL (0 mLs Intravenous Stopped 10/13/22 0300)  sodium chloride 0.9 % bolus 500 mL (0 mLs Intravenous Stopped 10/13/22 0435)  calcium gluconate 1 g/ 50 mL sodium chloride IVPB (0 mg Intravenous Stopped 10/13/22 0327)  albuterol (PROVENTIL) (2.5 MG/3ML) 0.083% nebulizer solution 10 mg (10 mg Nebulization  Given 10/13/22 0310)  sodium chloride 0.9 % bolus 1,000 mL (0 mLs Intravenous Stopped 10/13/22 0435)  calcium gluconate 1 g/ 50 mL sodium chloride IVPB (0 mg Intravenous Stopped 10/13/22 0612)  albuterol (PROVENTIL) (2.5 MG/3ML) 0.083% nebulizer solution 10 mg (10 mg Nebulization Given 10/13/22 0606)  sodium zirconium cyclosilicate (LOKELMA) packet 10 g (10 g Oral Given 10/13/22 0640)  calcium gluconate 1 g/ 50 mL sodium chloride IVPB (0 mg Intravenous Stopped 10/13/22 0829)  insulin aspart (novoLOG) injection 10 Units (10 Units Intravenous Given 10/13/22 0733)    Mobility manual wheelchair     Focused Assessments Neuro Assessment Handoff:  Swallow screen pass? Yes          Neuro Assessment: Exceptions to WDL Neuro Checks:      Has TPA been given? No If patient is a Neuro Trauma and patient is going to OR before floor call report to 4N Charge nurse: [phone] or [phone]   R Recommendations: See Admitting Provider Note  Report given to:   Additional Notes: Pt is waxing and waning with his mental status. He knows who he is and that is he is at the hospital but that is about it right now. Per the nurse when he came in she said he was A&Ox4 and that was baseline at the facility.

## 2022-10-13 NOTE — Consult Note (Signed)
NAME:  PJ ZEHNER, MRN:  [id number], DOB:  [birth year], LOS: 0 ADMISSION DATE:  10/13/2022, CONSULTATION DATE: 10/13/2022 REFERRING MD: Madelyn Flavors, CHIEF COMPLAINT: Altered mental status  History of Present Illness:  82 year old morbidly obese male with chronic hypoxic respiratory failure on 2 L oxygen, hypertension, hyperlipidemia and CKD stage IIIa who was admitted last month for proctocolitis and septic shock, improved had a colonoscopy which showed multiple polyps which were removed, noted to be tubular adenoma, negative for high-grade dysplasia, who presented to the emergency department with a complaint of bloody bowel movement as per patient he was in usual state of health this morning, when he went to have regular BM, he noted blood on his stool so he came to the emergency department for evaluation in the ED he was found to be lethargic with generalized tremors, workup showed AKI on CKD3a, metabolic acidosis and hyperkalemia requiring treatment.  He was given IV fluid.  Nephrology was consulted patient was found to be uremic with volume overload, nephrology decided to start CRRT to help with uremia, volume overload and hyperkalemia PCCM was consulted for help evaluation medical management  Patient denies abdominal pain, nausea, vomiting, headache, fever, chills or other complaints  Pertinent  Medical History   Past Medical History:  Diagnosis Date   Asthma    Chronic respiratory failure (HCC)    on 2L oxygen   Hyperlipidemia    Hypertension    Obese    OSA (obstructive sleep apnea)    Sleep study 08/2010 showed mild to moderate obstructive sleep apnea/hypopnea syndrome. However, never initiated CPAP   Venous stasis    Venous Statis Disease (03/28/2004)     Significant Hospital Events: Including procedures, antibiotic start and stop dates in addition to other pertinent events     Interim History / Subjective:  As above   Objective   Blood pressure (!) 129/99, pulse 85,  temperature (!) 97.4 F (36.3 C), temperature source Axillary, resp. rate 17, height 6' (1.829 m), weight (!) 150.7 kg, SpO2 97 %.        Intake/Output Summary (Last 24 hours) at 10/13/2022 1410 Last data filed at 10/13/2022 4782 Gross per 24 hour  Intake 2100 ml  Output --  Net 2100 ml   Filed Weights   10/13/22 0057  Weight: (!) 150.7 kg    Examination: Physical exam: General: Acute on chronically ill-appearing male, lying on the bed HEENT: [place]/AT, eyes anicteric.  moist mucus membranes Neuro: Awake, confused, generalized coarse tremors Chest: Coarse breath sounds, no wheezes or rhonchi Heart: Regular rate and rhythm, no murmurs or gallops Abdomen: Soft, nontender, nondistended, bowel sounds present Skin: No rash  Labs and images were reviewed  Resolved Hospital Problem list     Assessment & Plan:  AKI on CKD3 a Hyperkalemia High anion gap metabolic acidosis Uremic encephalopathy Patient is currently confused with coarse tremors likely due to uremic encephalopathy His baseline serum creatinine is around 1.4, now he presented with serum creatinine of Anemia of chronic disease 5.3 which improved to 4.7 but his BUN remains elevated at 165 He presented with serum potassium of 6.8, after treatment with calcium gluconate, insulin, D5, bicarbonate his serum potassium has trended down to 5.4 Nephrology is consulting, recommend starting CRRT to create metabolic abnormalities and encephalopathy Will admit him to ICU, will place HD catheter to start CRRT  Chronic hypoxic respiratory failure on home oxygen Obstructive sleep apnea Continue nasal cannula oxygen Continue BiPAP at night Monitor O2 sat with a  goal 92%  Morbid obesity Dietitian consulted Currently patient is n.p.o. Recent swallow evaluation is requested  Chronic HFpEF Monitor intake and output  Diabetes type 2 Monitor fingerstick goal 140-180 Continue sliding scale  Best Practice (right click and  "Reselect all SmartList Selections" daily)   Diet/type: NPO DVT prophylaxis: SCD GI prophylaxis: N/A Lines: Dialysis Catheter Foley:  Yes, and it is still needed Code Status:  full code Last date of multidisciplinary goals of care discussion [pending]  Labs   CBC: Recent Labs  Lab 10/13/22 0103 10/13/22 1128 10/13/22 1138  WBC 5.4  --   --   NEUTROABS 4.0  --   --   HGB 9.0* 8.2* 8.1*  HCT 29.4* 24.0* 25.8*  MCV 95.1  --   --   PLT 240  --   --     Basic Metabolic Panel: Recent Labs  Lab 10/13/22 0103 10/13/22 0405 10/13/22 1128 10/13/22 1138  NA 139 137 138 139  K 6.8* 6.3* 5.9* 5.4*  CL 108 112*  --  111  CO2 11* 13*  --  14*  GLUCOSE 96 96  --  92  BUN 172* 165*  --  165*  CREATININE 5.39* 4.92*  --  4.74*  CALCIUM 8.2* 7.6*  --  8.0*  MG  --  1.9  --   --   PHOS  --  7.4*  --   --    GFR: Estimated Creatinine Clearance: 19.1 mL/min (A) (by C-G formula based on SCr of 4.74 mg/dL (H)). Recent Labs  Lab 10/13/22 0103 10/13/22 0136 10/13/22 0405  WBC 5.4  --   --   LATICACIDVEN  --  0.7 0.7    Liver Function Tests: Recent Labs  Lab 10/13/22 0103  AST 15  ALT 15  ALKPHOS 49  BILITOT 0.5  PROT 6.2*  ALBUMIN 3.0*   Recent Labs  Lab 10/13/22 0103  LIPASE 39   Recent Labs  Lab 10/13/22 0136  AMMONIA 19    ABG    Component Value Date/Time   PHART 7.212 (L) 08/22/2022 2144   PCO2ART 61.3 (H) 08/22/2022 2144   PO2ART 97 08/22/2022 2144   HCO3 12.3 (L) 10/13/2022 1128   TCO2 13 (L) 10/13/2022 1128   ACIDBASEDEF 12.0 (H) 10/13/2022 1128   O2SAT 96 10/13/2022 1128     Coagulation Profile: Recent Labs  Lab 10/13/22 0103  INR 1.2    Cardiac Enzymes: Recent Labs  Lab 10/13/22 0103  CKTOTAL 338    HbA1C: Hgb A1c MFr Bld  Date/Time Value Ref Range Status  08/23/2022 06:15 AM 6.2 (H) 4.8 - 5.6 % Final    Comment:    (NOTE) Pre diabetes:          5.7%-6.4%  Diabetes:              >6.4%  Glycemic control for   <7.0% adults  with diabetes   12/23/2019 05:11 AM 5.7 (H) 4.8 - 5.6 % Final    Comment:    (NOTE) Pre diabetes:          5.7%-6.4%  Diabetes:              >6.4%  Glycemic control for   <7.0% adults with diabetes     CBG: No results for input(s): "GLUCAP" in the last 168 hours.  Review of Systems:   12 point review of system is significant for complaint mentioned HPI, rest is negative  Past Medical History:  He,  has  a past medical history of Asthma, Chronic respiratory failure (HCC), Hyperlipidemia, Hypertension, Obese, OSA (obstructive sleep apnea), and Venous stasis.   Surgical History:   Past Surgical History:  Procedure Laterality Date   BIOPSY  08/29/2022   Procedure: BIOPSY;  Surgeon: Beverley Fiedler, MD;  Location: [facility] ENDOSCOPY;  Service: Gastroenterology;;   COLONOSCOPY N/A 08/29/2022   Procedure: COLONOSCOPY;  Surgeon: Beverley Fiedler, MD;  Location: [facility] ENDOSCOPY;  Service: Gastroenterology;  Laterality: N/A;   POLYPECTOMY  08/29/2022   Procedure: POLYPECTOMY;  Surgeon: Beverley Fiedler, MD;  Location: [facility] ENDOSCOPY;  Service: Gastroenterology;;     Social History:   reports that he has never smoked. He has never used smokeless tobacco. He reports current alcohol use. He reports that he does not use drugs.   Family History:  His family history is not on file.   Allergies No Known Allergies   Home Medications  Prior to Admission medications   Medication Sig Start Date End Date Taking? Authorizing Provider  acetaminophen (TYLENOL) 500 MG tablet Take 2 tablets (1,000 mg total) by mouth every 8 (eight) hours. Patient taking differently: Take 1,000 mg by mouth every 8 (eight) hours as needed for mild pain. 09/14/19  Yes Rhetta Mura, MD  albuterol (PROVENTIL HFA;VENTOLIN HFA) 108 (90 Base) MCG/ACT inhaler Inhale 2 puffs into the lungs every 6 (six) hours as needed. For shortness of breath Patient taking differently: Inhale 2 puffs into the lungs every 6 (six) hours as needed ("for  asthma"). 10/28/15  Yes Dorothea Ogle, MD  amLODipine (NORVASC) 10 MG tablet Take 10 mg by mouth daily.   Yes [provider]  ANTI-FUNGAL 1 % powder Apply 1 Application topically See admin instructions. Apply under the abdominal fold every day and night shift   Yes [provider]  ARTIFICIAL TEARS 1.4 % ophthalmic solution Place 1 drop into both eyes every 2 (two) hours as needed for dry eyes.   Yes [provider]  aspirin EC 81 MG tablet Take 1 tablet (81 mg total) by mouth daily. 09/03/22  Yes Leroy Sea, MD  atorvastatin (LIPITOR) 20 MG tablet Take 20 mg by mouth at bedtime.   Yes [provider]  Cholecalciferol (VITAMIN D3) 125 MCG (5000 UT) CAPS Take 5,000 Units by mouth daily. 06/07/20  Yes [provider]  docusate sodium (COLACE) 100 MG capsule Take 100 mg by mouth in the morning and at bedtime.   Yes [provider]  folic acid (FOLVITE) 1 MG tablet Take 1 tablet (1 mg total) by mouth daily. 07/11/20  Yes Osvaldo Shipper, MD  furosemide (LASIX) 40 MG tablet Take 40 mg by mouth 2 (two) times daily.   Yes [provider]  gabapentin (NEURONTIN) 100 MG capsule Take 100 mg by mouth 3 (three) times daily.   Yes [provider]  ipratropium-albuterol (DUONEB) 0.5-2.5 (3) MG/3ML SOLN Take 3 mLs by nebulization every 6 (six) hours as needed for shortness of breath or wheezing (or asthma). 07/04/20  Yes [provider]  lisinopril (ZESTRIL) 5 MG tablet Take 5 mg by mouth See admin instructions. Take 5 mg by mouth in the morning and hold for a Systolic reading less than 110   Yes [provider]  metFORMIN (GLUCOPHAGE) 500 MG tablet Take 500 mg by mouth daily with breakfast.   Yes [provider]  polyethylene glycol (MIRALAX / GLYCOLAX) 17 g packet Take 17 g by mouth daily as needed for mild constipation.  Yes [provider]  pregabalin (LYRICA) 75 MG capsule Take 1 capsule (75 mg total)  by mouth 3 (three) times daily. 08/30/22  Yes Leroy Sea, MD  rOPINIRole (REQUIP) 1 MG tablet Take 1 mg by mouth every evening.   Yes [provider]  senna (SENOKOT) 8.6 MG TABS tablet Take 2 tablets by mouth at bedtime.   Yes [provider]  tamsulosin (FLOMAX) 0.4 MG CAPS capsule Take 1 capsule (0.4 mg total) by mouth daily. 08/31/22  Yes Leroy Sea, MD  traMADol (ULTRAM) 50 MG tablet Take 1 tablet (50 mg total) by mouth every 12 (twelve) hours as needed. 08/30/22  Yes Leroy Sea, MD  vitamin B-12 (CYANOCOBALAMIN) 500 MCG tablet Take 1 tablet (500 mcg total) by mouth daily. 07/11/20  Yes Osvaldo Shipper, MD  Zinc Oxide (DESITIN EX) Apply 1 application  topically See admin instructions. Apply to sacrum every shift   Yes [provider]     Critical care time:      The patient is critically ill due to acute renal failure, uremic encephalopathy, hyperkalemia, metabolic acidosis.  Critical care was necessary to treat or prevent imminent or life-threatening deterioration.  Critical care was time spent personally by me on the following activities: development of treatment plan with patient and/or surrogate as well as nursing, discussions with consultants, evaluation of patient's response to treatment, examination of patient, obtaining history from patient or surrogate, ordering and performing treatments and interventions, ordering and review of laboratory studies, ordering and review of radiographic studies, pulse oximetry, re-evaluation of patient's condition and participation in multidisciplinary rounds.   During this encounter critical care time was devoted to patient care services described in this note for 38 minutes.    Cheri Fowler, MD [facility] See Amion for pager If no response to pager, please call [phone] until 7pm After 7pm, Please call E-link [phone]

## 2022-10-13 NOTE — Procedures (Signed)
Arterial Catheter Insertion Procedure Note  Brian Silva  [id number]  Jan 23, 1941  Date:10/13/22  Time:4:17 PM    Provider Performing: Cheri Fowler    Procedure: Insertion of Arterial Line (81191) with US guidance (47829)   Indication(s) Blood pressure monitoring and/or need for frequent ABGs  Consent Risks of the procedure as well as the alternatives and risks of each were explained to the patient and/or caregiver.  Consent for the procedure was obtained and is signed in the bedside chart  Anesthesia None   Time Out Verified patient identification, verified procedure, site/side was marked, verified correct patient position, special equipment/implants available, medications/allergies/relevant history reviewed, required imaging and test results available.   Sterile Technique Maximal sterile technique including full sterile barrier drape, hand hygiene, sterile gown, sterile gloves, mask, hair covering, sterile ultrasound probe cover (if used).   Procedure Description Area of catheter insertion was cleaned with chlorhexidine and draped in sterile fashion. With real-time ultrasound guidance an arterial catheter was placed into the right  Axillary  artery.  Appropriate arterial tracings confirmed on monitor.     Complications/Tolerance None; patient tolerated the procedure well.   EBL Minimal   Specimen(s) None

## 2022-10-13 NOTE — Progress Notes (Signed)
Patient ID: Brian Silva, male   DOB: Nov 08, 1940, 82 y.o.   MRN: [id number] New orders for Continuous renal replacement therapy. Called [place] ED RN to have them change destination to ICU, not Progressive floor.  Lidia Collum, RN

## 2022-10-13 NOTE — Procedures (Signed)
Central Venous Catheter Insertion Procedure Note  GROVER ROBINSON  [id number]  11/23/1940  Date:10/13/22  Time:4:16 PM   Provider Performing:Shawnte Winton   Procedure: Insertion of Non-tunneled Central Venous Catheter(36556)with US guidance (40981)    Indication(s) Hemodialysis  Consent Risks of the procedure as well as the alternatives and risks of each were explained to the patient and/or caregiver.  Consent for the procedure was obtained and is signed in the bedside chart  Anesthesia Topical only with 1% lidocaine   Timeout Verified patient identification, verified procedure, site/side was marked, verified correct patient position, special equipment/implants available, medications/allergies/relevant history reviewed, required imaging and test results available.  Sterile Technique Maximal sterile technique including full sterile barrier drape, hand hygiene, sterile gown, sterile gloves, mask, hair covering, sterile ultrasound probe cover (if used).  Procedure Description Area of catheter insertion was cleaned with chlorhexidine and draped in sterile fashion.   With real-time ultrasound guidance a HD catheter was placed into the right internal jugular vein.  Nonpulsatile blood flow and easy flushing noted in all ports.  The catheter was sutured in place and sterile dressing applied.  Complications/Tolerance None; patient tolerated the procedure well. Chest X-ray is ordered to verify placement for internal jugular or subclavian cannulation.  Chest x-ray is not ordered for femoral cannulation.  EBL Minimal  Specimen(s) None

## 2022-10-14 DIAGNOSIS — G9341 Metabolic encephalopathy: Secondary | ICD-10-CM | POA: Diagnosis not present

## 2022-10-14 DIAGNOSIS — E119 Type 2 diabetes mellitus without complications: Secondary | ICD-10-CM | POA: Diagnosis not present

## 2022-10-14 DIAGNOSIS — N179 Acute kidney failure, unspecified: Secondary | ICD-10-CM | POA: Diagnosis not present

## 2022-10-14 DIAGNOSIS — J9611 Chronic respiratory failure with hypoxia: Secondary | ICD-10-CM | POA: Diagnosis not present

## 2022-10-14 LAB — CBC
HCT: 23.9 % — ABNORMAL LOW (ref 39.0–52.0)
Hemoglobin: 7.7 g/dL — ABNORMAL LOW (ref 13.0–17.0)
MCH: 29.2 pg (ref 26.0–34.0)
MCHC: 32.2 g/dL (ref 30.0–36.0)
MCV: 90.5 fL (ref 80.0–100.0)
Platelets: 194 10*3/uL (ref 150–400)
RBC: 2.64 MIL/uL — ABNORMAL LOW (ref 4.22–5.81)
RDW: 15.1 % (ref 11.5–15.5)
WBC: 4.8 10*3/uL (ref 4.0–10.5)
nRBC: 0 % (ref 0.0–0.2)

## 2022-10-14 LAB — RENAL FUNCTION PANEL
Albumin: 2.7 g/dL — ABNORMAL LOW (ref 3.5–5.0)
Albumin: 2.7 g/dL — ABNORMAL LOW (ref 3.5–5.0)
Anion gap: 10 (ref 5–15)
Anion gap: 10 (ref 5–15)
BUN: 102 mg/dL — ABNORMAL HIGH (ref 8–23)
BUN: 53 mg/dL — ABNORMAL HIGH (ref 8–23)
CO2: 20 mmol/L — ABNORMAL LOW (ref 22–32)
CO2: 22 mmol/L (ref 22–32)
Calcium: 7.6 mg/dL — ABNORMAL LOW (ref 8.9–10.3)
Calcium: 8 mg/dL — ABNORMAL LOW (ref 8.9–10.3)
Chloride: 109 mmol/L (ref 98–111)
Chloride: 106 mmol/L (ref 98–111)
Creatinine, Ser: 2.84 mg/dL — ABNORMAL HIGH (ref 0.61–1.24)
Creatinine, Ser: 1.82 mg/dL — ABNORMAL HIGH (ref 0.61–1.24)
GFR, Estimated: 22 mL/min — ABNORMAL LOW (ref >60)
GFR, Estimated: 37 mL/min — ABNORMAL LOW (ref >60)
Glucose, Bld: 126 mg/dL — ABNORMAL HIGH (ref 70–99)
Glucose, Bld: 116 mg/dL — ABNORMAL HIGH (ref 70–99)
Phosphorus: 4.3 mg/dL (ref 2.5–4.6)
Phosphorus: 2.4 mg/dL — ABNORMAL LOW (ref 2.5–4.6)
Potassium: 5.1 mmol/L (ref 3.5–5.1)
Potassium: 4.9 mmol/L (ref 3.5–5.1)
Sodium: 139 mmol/L (ref 135–145)
Sodium: 138 mmol/L (ref 135–145)

## 2022-10-14 LAB — GLUCOSE, CAPILLARY
Glucose-Capillary: 103 mg/dL — ABNORMAL HIGH (ref 70–99)
Glucose-Capillary: 107 mg/dL — ABNORMAL HIGH (ref 70–99)
Glucose-Capillary: 111 mg/dL — ABNORMAL HIGH (ref 70–99)
Glucose-Capillary: 114 mg/dL — ABNORMAL HIGH (ref 70–99)
Glucose-Capillary: 116 mg/dL — ABNORMAL HIGH (ref 70–99)
Glucose-Capillary: 119 mg/dL — ABNORMAL HIGH (ref 70–99)

## 2022-10-14 LAB — CULTURE, BLOOD (ROUTINE X 2): Special Requests: ADEQUATE

## 2022-10-14 LAB — MAGNESIUM: Magnesium: 1.9 mg/dL (ref 1.7–2.4)

## 2022-10-14 LAB — APTT: aPTT: 32 seconds (ref 24–36)

## 2022-10-14 LAB — MRSA NEXT GEN BY PCR, NASAL: MRSA by PCR Next Gen: DETECTED — AB

## 2022-10-14 MED ORDER — ONDANSETRON HCL 4 MG/2ML IJ SOLN
INTRAMUSCULAR | Status: AC
  Administered 2022-10-14: 4 mg via INTRAVENOUS
  Filled 2022-10-14: qty 2

## 2022-10-14 MED ORDER — SODIUM CHLORIDE 0.9% FLUSH: 10.0000 mL | INTRAVENOUS | Status: DC | PRN

## 2022-10-14 MED ORDER — ORAL CARE MOUTH RINSE: 15.0000 mL | OROMUCOSAL | Status: DC | PRN

## 2022-10-14 MED ORDER — ONDANSETRON HCL 4 MG/2ML IJ SOLN
4.0000 mg | Freq: Three times a day (TID) | INTRAMUSCULAR | Status: DC | PRN
  Administered 2022-10-14: 4 mg via INTRAVENOUS
  Filled 2022-10-14: qty 2

## 2022-10-14 MED ORDER — RENA-VITE PO TABS
1.0000 | ORAL_TABLET | Freq: Every day | ORAL | Status: DC
  Administered 2022-10-14 – 2022-10-19 (×6): 1 via ORAL
  Filled 2022-10-14 (×6): qty 1

## 2022-10-14 MED ORDER — ONDANSETRON HCL 4 MG/2ML IJ SOLN: 4.0000 mg | Freq: Three times a day (TID) | INTRAMUSCULAR | Status: DC | PRN

## 2022-10-14 MED ORDER — VANCOMYCIN HCL 1500 MG/300ML IV SOLN
1500.0000 mg | INTRAVENOUS | Status: DC
  Administered 2022-10-14: 1500 mg via INTRAVENOUS
  Filled 2022-10-14: qty 300

## 2022-10-14 MED ORDER — MUPIROCIN 2 % EX OINT
1.0000 | TOPICAL_OINTMENT | Freq: Two times a day (BID) | CUTANEOUS | Status: AC
  Administered 2022-10-14 – 2022-10-18 (×10): 1 via NASAL
  Filled 2022-10-14 (×2): qty 22

## 2022-10-14 MED ORDER — SODIUM CHLORIDE 0.9% FLUSH
10.0000 mL | Freq: Two times a day (BID) | INTRAVENOUS | Status: DC
  Administered 2022-10-14: 20 mL
  Administered 2022-10-15 – 2022-10-20 (×7): 10 mL

## 2022-10-14 MED ORDER — ORAL CARE MOUTH RINSE
15.0000 mL | OROMUCOSAL | Status: DC
  Administered 2022-10-15 – 2022-10-19 (×8): 15 mL via OROMUCOSAL

## 2022-10-14 MED ORDER — PIPERACILLIN-TAZOBACTAM 3.375 G IVPB 30 MIN
3.3750 g | Freq: Four times a day (QID) | INTRAVENOUS | Status: DC
  Administered 2022-10-14 – 2022-10-15 (×3): 3.375 g via INTRAVENOUS
  Filled 2022-10-14 (×7): qty 50

## 2022-10-14 MED ORDER — GLUCERNA SHAKE PO LIQD
237.0000 mL | Freq: Three times a day (TID) | ORAL | Status: DC
  Administered 2022-10-14: 10 mL via ORAL
  Administered 2022-10-16 – 2022-10-19 (×8): 237 mL via ORAL

## 2022-10-14 MED ORDER — PIPERACILLIN-TAZOBACTAM 3.375 G IVPB 30 MIN
3.3750 g | Freq: Four times a day (QID) | INTRAVENOUS | Status: DC
  Filled 2022-10-14 (×2): qty 50

## 2022-10-14 NOTE — Progress Notes (Signed)
[facility] NEPHROLOGY PROGRESS NOTE  Assessment/ Plan: Pt is a 82 y.o. yo male  with past medical history significant for hypertension, HLD, morbid obesity, chronic diastolic CHF, chronic respiratory failure who presented from SNF for blood in the stool, seen as a consultation for AKI on CKD and hyperkalemia.   # Acute kidney injury on CKD IIIb (b/l cr 1.4-1.6): Likely ischemic ATN in the setting of shock (SBP 70-80) associated with the use of lisinopril, possible GI bleed etc.  CT scan without hydronephrosis.  UA bland. Started CRRT on 6/22 for uremic encephalopathy and AKI.  He is tolerating CRRT well with significant improvement in mental status.  Continue UF goal 100 cc/h. Continue strict ins and out, lab monitoring.  Discussed with ICU team.   # Severe hyperkalemia due to AKI and ACE inhibitor: Received medical treatment in ER and now managing with dialysis.   # Anion gap metabolic acidosis with renal failure, also on metformin at home.  Improving with CRRT..   # Hypotension/shock possible septic: Requiring Levophed.  Covered by antibiotics per primary team including Vanco and Zosyn.  # Chronic CHF: Manage volume with UF.   Subjective: Seen and examined in ICU.  Tolerating UF very well.  1.4 L of urine output as well.  Mental status much better.  Discussed with ICU nurse and providers. Objective Vital signs in last 24 hours: Vitals:   10/14/22 0915 10/14/22 0930 10/14/22 0945 10/14/22 1002  BP:  (!) 113/53    Pulse: 76 73 77 81  Resp: 16 16 20 20   Temp:      TempSrc:      SpO2: 97% 97% 97% 96%  Weight:      Height:       Weight change: -2.1 kg  Intake/Output Summary (Last 24 hours) at 10/14/2022 1021 Last data filed at 10/14/2022 1000 Gross per 24 hour  Intake 2908.59 ml  Output 4438.3 ml  Net -1529.71 ml       Labs: RENAL PANEL Recent Labs  Lab 10/13/22 0103 10/13/22 0405 10/13/22 1128 10/13/22 1138 10/13/22 1633 10/14/22 0346  NA 139 137 138  139 140 139  K 6.8* 6.3* 5.9* 5.4* 5.5* 5.1  CL 108 112*  --  111 109 109  CO2 11* 13*  --  14* 16* 20*  GLUCOSE 96 96  --  92 103* 126*  BUN 172* 165*  --  165* 159* 102*  CREATININE 5.39* 4.92*  --  4.74* 4.49* 2.84*  CALCIUM 8.2* 7.6*  --  8.0* 8.2* 7.6*  MG  --  1.9  --   --   --  1.9  PHOS  --  7.4*  --   --  6.5* 4.3  ALBUMIN 3.0*  --   --   --  2.7* 2.7*    Liver Function Tests: Recent Labs  Lab 10/13/22 0103 10/13/22 1633 10/14/22 0346  AST 15  --   --   ALT 15  --   --   ALKPHOS 49  --   --   BILITOT 0.5  --   --   PROT 6.2*  --   --   ALBUMIN 3.0* 2.7* 2.7*   Recent Labs  Lab 10/13/22 0103  LIPASE 39   Recent Labs  Lab 10/13/22 0136  AMMONIA 19   CBC: Recent Labs    08/26/22 0628 08/27/22 0248 08/27/22 0426 08/28/22 0442 08/30/22 0607 10/13/22 0103 10/13/22 1128 10/13/22 1138 10/14/22 0346  HGB 9.4* 9.4*  --    < >  10.0* 9.0* 8.2* 8.1* 7.7*  MCV 89.3 87.0  --    < > 87.6 95.1  --   --  90.5  VITAMINB12 1,064*  --   --   --   --   --   --   --   --   FOLATE  --  >40.0  --   --   --   --   --   --   --   FERRITIN  --   --  403*  --   --   --   --   --   --   TIBC  --   --  209*  --   --   --   --   --   --   IRON  --   --  42*  --   --   --   --   --   --   RETICCTPCT  --  1.6  --   --   --   --   --   --   --    < > = values in this interval not displayed.    Cardiac Enzymes: Recent Labs  Lab 10/13/22 0103  CKTOTAL 338   CBG: Recent Labs  Lab 10/13/22 1623 10/13/22 1932 10/13/22 2331 10/14/22 0348 10/14/22 0726  GLUCAP 94 112* 120* 119* 114*    Iron Studies: No results for input(s): "IRON", "TIBC", "TRANSFERRIN", "FERRITIN" in the last 72 hours. Studies/Results: DG CHEST PORT 1 VIEW  Result Date: 10/13/2022 CLINICAL DATA:  Central line placement. EXAM: PORTABLE CHEST 1 VIEW COMPARISON:  October 13, 2022. FINDINGS: Stable cardiomegaly. Interval placement of right internal jugular catheter with distal tip in expected position of the  SVC. No pneumothorax is noted. Lungs are clear. Bony thorax is unremarkable. IMPRESSION: Interval placement of right internal jugular catheter with distal tip in expected position of the SVC. Electronically Signed   By: Lupita Raider M.D.   On: 10/13/2022 16:49   CT ABDOMEN PELVIS WO CONTRAST  Result Date: 10/13/2022 CLINICAL DATA:  Lower GI bleed. EXAM: CT ABDOMEN AND PELVIS WITHOUT CONTRAST TECHNIQUE: Multidetector CT imaging of the abdomen and pelvis was performed following the standard protocol without IV contrast. RADIATION DOSE REDUCTION: This exam was performed according to the departmental dose-optimization program which includes automated exposure control, adjustment of the mA and/or kV according to patient size and/or use of iterative reconstruction technique. COMPARISON:  08/22/2022 FINDINGS: Lower chest: Dependent atelectasis noted in the lower lungs. Hepatobiliary: No suspicious focal abnormality in the liver on this study without intravenous contrast. Gallbladder is distended. No intrahepatic or extrahepatic biliary dilation. Pancreas: No focal mass lesion. No dilatation of the main duct. No intraparenchymal cyst. No peripancreatic edema. Spleen: No splenomegaly. No suspicious focal mass lesion. Adrenals/Urinary Tract: No adrenal nodule or mass. Kidneys unremarkable. No evidence for hydroureter. The urinary bladder appears normal for the degree of distention. Tiny left-sided bladder wall diverticulum evident. Stomach/Bowel: Stomach is unremarkable. No gastric wall thickening. No evidence of outlet obstruction. Duodenum is normally positioned as is the ligament of Treitz. No small bowel wall thickening. No small bowel dilatation. The terminal ileum is normal. The appendix is normal. No gross colonic mass. No colonic wall thickening. Diverticular changes are noted in the left colon without evidence of diverticulitis. Vascular/Lymphatic: There is moderate atherosclerotic calcification of the  abdominal aorta without aneurysm. There is no gastrohepatic or hepatoduodenal ligament lymphadenopathy. No retroperitoneal or mesenteric lymphadenopathy. No pelvic sidewall lymphadenopathy.  Reproductive: The prostate gland and seminal vesicles are unremarkable. Other: No intraperitoneal free fluid. Musculoskeletal: Paraumbilical ventral hernia again noted, containing only fat No worrisome lytic or sclerotic osseous abnormality. Mild superior endplate compression deformity at T12 is stable in the interval. IMPRESSION: 1. No acute findings in the abdomen or pelvis. The circumferential wall thickening and edema/inflammation associated with the rectum on the previous study have resolved in the interval. Evaluation for lower GI bleeding limited on noncontrast imaging. 2. Left colonic diverticulosis without diverticulitis. 3. Paraumbilical ventral hernia containing only fat. 4.  Aortic Atherosclerosis (ICD10-I70.0). Electronically Signed   By: Kennith Center M.D.   On: 10/13/2022 05:08   CT Head Wo Contrast  Result Date: 10/13/2022 CLINICAL DATA:  Altered mental status. EXAM: CT HEAD WITHOUT CONTRAST TECHNIQUE: Contiguous axial images were obtained from the base of the skull through the vertex without intravenous contrast. RADIATION DOSE REDUCTION: This exam was performed according to the departmental dose-optimization program which includes automated exposure control, adjustment of the mA and/or kV according to patient size and/or use of iterative reconstruction technique. COMPARISON:  Sep 09, 2021 FINDINGS: Brain: There is mild cerebral atrophy with widening of the extra-axial spaces and ventricular dilatation. There are areas of decreased attenuation within the white matter tracts of the supratentorial brain, consistent with microvascular disease changes. Vascular: No hyperdense vessel or unexpected calcification. Skull: Normal. Negative for fracture or focal lesion. Sinuses/Orbits: No acute finding. Other: None.  IMPRESSION: 1. Generalized cerebral atrophy. 2. No acute intracranial abnormality. Electronically Signed   By: Aram Candela M.D.   On: 10/13/2022 02:48   DG Chest Portable 1 View  Result Date: 10/13/2022 CLINICAL DATA:  Shortness of breath. EXAM: PORTABLE CHEST 1 VIEW COMPARISON:  08/26/2022. FINDINGS: Heart is enlarged and the mediastinal contour is within normal limits. There is atherosclerotic calcification of the aorta. Lung volumes are low with mild atelectasis or infiltrate at the lung bases. No effusion or pneumothorax. No acute osseous abnormality. IMPRESSION: 1. Low lung volumes with mild atelectasis or infiltrate at the lung bases. 2. Cardiomegaly. Electronically Signed   By: Thornell Sartorius M.D.   On: 10/13/2022 02:19    Medications: Infusions:   prismasol BGK 4/2.5 400 mL/hr at 10/14/22 0726    prismasol BGK 4/2.5 400 mL/hr at 10/14/22 0726   sodium chloride Stopped (10/14/22 0635)   heparin 10,000 units/ 20 mL infusion syringe 750 Units/hr (10/14/22 1000)   norepinephrine (LEVOPHED) Adult infusion 6 mcg/min (10/14/22 1000)   piperacillin-tazobactam (ZOSYN)  IV 12.5 mL/hr at 10/14/22 1000   prismasol BGK 4/2.5 1,500 mL/hr at 10/14/22 4034    Scheduled Medications:  atorvastatin  20 mg Oral QHS   Chlorhexidine Gluconate Cloth  6 each Topical Q0600   cyanocobalamin  500 mcg Oral Daily   folic acid  1 mg Oral Daily   insulin aspart  0-15 Units Subcutaneous Q4H   mupirocin ointment  1 Application Nasal BID   pantoprazole (PROTONIX) IV  40 mg Intravenous Q24H   rOPINIRole  1 mg Oral QPM   sodium chloride flush  3 mL Intravenous Q12H   tamsulosin  0.4 mg Oral Daily    have reviewed scheduled and prn medications.  Physical Exam: General:NAD, comfortable, significantly more alert and awake today. Heart:RRR, s1s2 nl Lungs: Distant breath sound. Abdomen:soft, Non-tender, non-distended Extremities: Lower extremities peripheral edema present. Dialysis Access: Right IJ  temporary HD catheter in place  Brian Silva Prasad Richanda Darin 10/14/2022,10:21 AM  LOS: 1 day

## 2022-10-14 NOTE — Progress Notes (Signed)
NAME:  Brian Silva, MRN:  [id number], DOB:  [birth year], LOS: 1 ADMISSION DATE:  10/13/2022, CONSULTATION DATE: 10/13/2022 REFERRING MD: Madelyn Flavors, CHIEF COMPLAINT: Altered mental status  History of Present Illness:  82 year old morbidly obese male with chronic hypoxic respiratory failure on 2 L oxygen, hypertension, hyperlipidemia and CKD stage IIIa who was admitted last month for proctocolitis and septic shock, improved had a colonoscopy which showed multiple polyps which were removed, noted to be tubular adenoma, negative for high-grade dysplasia, who presented to the emergency department with a complaint of bloody bowel movement as per patient he was in usual state of health this morning, when he went to have regular BM, he noted blood on his stool so he came to the emergency department for evaluation in the ED he was found to be lethargic with generalized tremors, workup showed AKI on CKD3a, metabolic acidosis and hyperkalemia requiring treatment.  He was given IV fluid.  Nephrology was consulted patient was found to be uremic with volume overload, nephrology decided to start CRRT to help with uremia, volume overload and hyperkalemia PCCM was consulted for help evaluation medical management  Patient denies abdominal pain, nausea, vomiting, headache, fever, chills or other complaints  Pertinent  Medical History   Past Medical History:  Diagnosis Date   Asthma    Chronic respiratory failure (HCC)    on 2L oxygen   Hyperlipidemia    Hypertension    Obese    OSA (obstructive sleep apnea)    Sleep study 08/2010 showed mild to moderate obstructive sleep apnea/hypopnea syndrome. However, never initiated CPAP   Venous stasis    Venous Statis Disease (03/28/2004)     Significant Hospital Events: Including procedures, antibiotic start and stop dates in addition to other pertinent events     Interim History / Subjective:  Patient is much more interactive today He is awake following  commands, required BiPAP overnight, currently on room air Stated feeling hungry Remained afebrile Still requiring vasopressor support with Levophed at 10 mics On CRRT  Objective   Blood pressure (!) 105/55, pulse 70, temperature 97.9 F (36.6 C), temperature source Oral, resp. rate 17, height 6' (1.829 m), weight (!) 148.6 kg, SpO2 97 %.        Intake/Output Summary (Last 24 hours) at 10/14/2022 0741 Last data filed at 10/14/2022 0700 Gross per 24 hour  Intake 2780.27 ml  Output 3413.3 ml  Net -633.03 ml   Filed Weights   10/13/22 0057 10/14/22 0500  Weight: (!) 150.7 kg (!) 148.6 kg    Examination: Physical exam: General: Acute on chronically ill-appearing morbidly obese male, lying on the bed HEENT: [place]/AT, eyes anicteric.  moist mucus membranes Neuro: Alert, awake following commands, moving all 4 extremities he still has bilateral upper extremity tremors but improved from yesterday Chest: Reduced air entry all over no wheezes or rhonchi Heart: Regular rate and rhythm, no murmurs or gallops Abdomen: Soft, nontender, nondistended, bowel sounds present Skin: No rash  Labs and images were reviewed  Resolved Hospital Problem list     Assessment & Plan:  AKI on CKD3 a Hyperkalemia High anion gap metabolic acidosis Uremic encephalopathy Patient mental status has improved from yesterday Continues to have fine tremors as BUN is trending down his tremors are improving down from 165-102 He is on CRRT, plan to continue for today Nephrology is following Serum bicarbonate has improved Serum potassium is down to 5.1  Shock, multifactorial including acidosis/sepsis Staph epi bacteremia versus contamination Patient continued to  require vasopressor support, currently on Levophed at 10 Acidosis is improving but he continues to require pressors His blood culture is growing MRSE in 2 out of 2 bottles Will repeat blood cultures Continue vancomycin Continue Zosyn for empiric  coverage  Chronic hypoxic respiratory failure on home oxygen Obstructive sleep apnea, per patient he is not on CPAP at night Currently on room air Continue BiPAP at night Monitor O2 sat with a goal 92%  Morbid obesity Dietitian consulted Will do bedside swallow evaluation  Chronic HFpEF Monitor intake and output  Diabetes type 2 Monitor fingerstick goal 140-180 Continue sliding scale  Best Practice (right click and "Reselect all SmartList Selections" daily)   Diet/type: NPO bedside swallow evaluation DVT prophylaxis: SCD GI prophylaxis: N/A Lines: Dialysis Catheter Foley:  Yes, and it is still needed Code Status:  full code Last date of multidisciplinary goals of care discussion 6/23: With the patient, he would like to continue full scope of care]  Labs   CBC: Recent Labs  Lab 10/13/22 0103 10/13/22 1128 10/13/22 1138 10/14/22 0346  WBC 5.4  --   --  4.8  NEUTROABS 4.0  --   --   --   HGB 9.0* 8.2* 8.1* 7.7*  HCT 29.4* 24.0* 25.8* 23.9*  MCV 95.1  --   --  90.5  PLT 240  --   --  194    Basic Metabolic Panel: Recent Labs  Lab 10/13/22 0103 10/13/22 0405 10/13/22 1128 10/13/22 1138 10/13/22 1633 10/14/22 0346  NA 139 137 138 139 140 139  K 6.8* 6.3* 5.9* 5.4* 5.5* 5.1  CL 108 112*  --  111 109 109  CO2 11* 13*  --  14* 16* 20*  GLUCOSE 96 96  --  92 103* 126*  BUN 172* 165*  --  165* 159* 102*  CREATININE 5.39* 4.92*  --  4.74* 4.49* 2.84*  CALCIUM 8.2* 7.6*  --  8.0* 8.2* 7.6*  MG  --  1.9  --   --   --  1.9  PHOS  --  7.4*  --   --  6.5* 4.3   GFR: Estimated Creatinine Clearance: 31.6 mL/min (A) (by C-G formula based on SCr of 2.84 mg/dL (H)). Recent Labs  Lab 10/13/22 0103 10/13/22 0136 10/13/22 0405 10/14/22 0346  WBC 5.4  --   --  4.8  LATICACIDVEN  --  0.7 0.7  --     Liver Function Tests: Recent Labs  Lab 10/13/22 0103 10/13/22 1633 10/14/22 0346  AST 15  --   --   ALT 15  --   --   ALKPHOS 49  --   --   BILITOT 0.5  --   --    PROT 6.2*  --   --   ALBUMIN 3.0* 2.7* 2.7*   Recent Labs  Lab 10/13/22 0103  LIPASE 39   Recent Labs  Lab 10/13/22 0136  AMMONIA 19    ABG    Component Value Date/Time   PHART 7.212 (L) 08/22/2022 2144   PCO2ART 61.3 (H) 08/22/2022 2144   PO2ART 97 08/22/2022 2144   HCO3 12.3 (L) 10/13/2022 1128   TCO2 13 (L) 10/13/2022 1128   ACIDBASEDEF 12.0 (H) 10/13/2022 1128   O2SAT 96 10/13/2022 1128     Coagulation Profile: Recent Labs  Lab 10/13/22 0103  INR 1.2    Cardiac Enzymes: Recent Labs  Lab 10/13/22 0103  CKTOTAL 338    HbA1C: Hgb A1c MFr Bld  Date/Time  Value Ref Range Status  08/23/2022 06:15 AM 6.2 (H) 4.8 - 5.6 % Final    Comment:    (NOTE) Pre diabetes:          5.7%-6.4%  Diabetes:              >6.4%  Glycemic control for   <7.0% adults with diabetes   12/23/2019 05:11 AM 5.7 (H) 4.8 - 5.6 % Final    Comment:    (NOTE) Pre diabetes:          5.7%-6.4%  Diabetes:              >6.4%  Glycemic control for   <7.0% adults with diabetes     CBG: Recent Labs  Lab 10/13/22 1623 10/13/22 1932 10/13/22 2331 10/14/22 0348 10/14/22 0726  GLUCAP 94 112* 120* 119* 114*   Critical care time:      The patient is critically ill due to acute renal failure, uremic encephalopathy, hyperkalemia, metabolic acidosis.  Critical care was necessary to treat or prevent imminent or life-threatening deterioration.  Critical care was time spent personally by me on the following activities: development of treatment plan with patient and/or surrogate as well as nursing, discussions with consultants, evaluation of patient's response to treatment, examination of patient, obtaining history from patient or surrogate, ordering and performing treatments and interventions, ordering and review of laboratory studies, ordering and review of radiographic studies, pulse oximetry, re-evaluation of patient's condition and participation in multidisciplinary rounds.   During  this encounter critical care time was devoted to patient care services described in this note for 36 minutes.    Cheri Fowler, MD [facility] See Amion for pager If no response to pager, please call [phone] until 7pm After 7pm, Please call E-link [phone]

## 2022-10-14 NOTE — Progress Notes (Signed)
Pharmacy Antibiotic Note  Brian Silva is a 82 y.o. male admitted on 10/13/2022 with intra-abdominal infection.  Afebrile, wbc wnl acute on chronic renal insufficiency> now on CRRT. Pharmacy has been consulted for vancomycin and zosyn dosing.  BCID showed MRSE in both bottles. Blood culture is growing staph haemolyticus. MRSA PCR positive. Patient is currently requiring CRRT and remains on pressors.  Plan: Change Zosyn to 30 minute infusion while on CRRT Start vancomycin 1500 mg every 24 hours Vancomycin levels at steady state if continued  Height: 6' (182.9 cm) Weight: (!) 148.6 kg (327 lb 9.7 oz) IBW/kg (Calculated) : 77.6  Temp (24hrs), Avg:97.7 F (36.5 C), Min:97 F (36.1 C), Max:98.7 F (37.1 C)  Recent Labs  Lab 10/13/22 0103 10/13/22 0136 10/13/22 0405 10/13/22 1138 10/13/22 1633 10/14/22 0346  WBC 5.4  --   --   --   --  4.8  CREATININE 5.39*  --  4.92* 4.74* 4.49* 2.84*  LATICACIDVEN  --  0.7 0.7  --   --   --      Estimated Creatinine Clearance: 31.6 mL/min (A) (by C-G formula based on SCr of 2.84 mg/dL (H)).    No Known Allergies  Antimicrobials this admission: Vanc 6/22 >> Zosyn 6/22 >>  Microbiology results: 6/22 Bcx: staph haemolyticus 6/22 BCID: MRSE 2/2 6/22 MRSA PCR: pos  Thank you for involving pharmacy in this patient's care.  [name], PharmD PGY2 Pharmacy Resident 10/14/2022 11:55 AM

## 2022-10-14 NOTE — Progress Notes (Signed)
Initial Nutrition Assessment  DOCUMENTATION CODES:   Morbid obesity  INTERVENTION:  - Add Glucerna Shake po TID, each supplement provides 220 kcal and 10 grams of protein  - Add Renal MVI q day.   NUTRITION DIAGNOSIS:   Inadequate oral intake related to poor appetite as evidenced by meal completion < 25%.  GOAL:   Patient will meet greater than or equal to 90% of their needs  MONITOR:   PO intake, Supplement acceptance  REASON FOR ASSESSMENT:   Consult Assessment of nutrition requirement/status  ASSESSMENT:   82 y.o. male admits related to blood in stools. PMH includes: respiratory failure, HLD, HTN. Pt is currently receiving medical management related to AKI superimposed on CKD.  Meds reviewed: lipitor, folic acid, sliding scale insulin. Labs reviewed: BUN/Creatinine elevated.   The pt is currently oriented x3 and on a heart healthy carb modified diet. RD attempted to call room but no answer. Spoke with RN who reports that the pt has no appetite and has just been drinking diet colas. RD will add Glucerna with meals. RD will continue to monitor PO intakes.   NUTRITION - FOCUSED PHYSICAL EXAM:  Remote assessment.  Diet Order:   Diet Order             Diet heart healthy/carb modified Room service appropriate? Yes with Assist; Fluid consistency: Nectar Thick  Diet effective now                   EDUCATION NEEDS:   Not appropriate for education at this time  Skin:  Skin Assessment: Reviewed RN Assessment  Last BM:  6/22  Height:   Ht Readings from Last 1 Encounters:  10/13/22 6' (1.829 m)    Weight:   Wt Readings from Last 1 Encounters:  10/14/22 (!) 148.6 kg    Ideal Body Weight:     BMI:  Body mass index is 44.43 kg/m.  Estimated Nutritional Needs:   Kcal:  1700-2100 kcals  Protein:  85-105 gm  Fluid:  >/= 1.7 L  Bethann Humble, RD, LDN, CNSC.

## 2022-10-14 NOTE — Evaluation (Signed)
Clinical/Bedside Swallow Evaluation Patient Details  Name: Brian Silva MRN: [id number] Date of Birth: Aug 08, 1940  Today's Date: 10/14/2022 Time: SLP Start Time (ACUTE ONLY): 0845 SLP Stop Time (ACUTE ONLY): 0900 SLP Time Calculation (min) (ACUTE ONLY): 15 min  Past Medical History:  Past Medical History:  Diagnosis Date   Asthma    Chronic respiratory failure (HCC)    on 2L oxygen   Hyperlipidemia    Hypertension    Obese    OSA (obstructive sleep apnea)    Sleep study 08/2010 showed mild to moderate obstructive sleep apnea/hypopnea syndrome. However, never initiated CPAP   Venous stasis    Venous Statis Disease (03/28/2004)   Past Surgical History:  Past Surgical History:  Procedure Laterality Date   BIOPSY  08/29/2022   Procedure: BIOPSY;  Surgeon: Beverley Fiedler, MD;  Location: [facility] ENDOSCOPY;  Service: Gastroenterology;;   COLONOSCOPY N/A 08/29/2022   Procedure: COLONOSCOPY;  Surgeon: Beverley Fiedler, MD;  Location: [facility] ENDOSCOPY;  Service: Gastroenterology;  Laterality: N/A;   POLYPECTOMY  08/29/2022   Procedure: POLYPECTOMY;  Surgeon: Beverley Fiedler, MD;  Location: [facility] ENDOSCOPY;  Service: Gastroenterology;;   HPI:  Brian Silva is a 82 y.o. male with PMH reviewed presents to the ED for evaluation of weakness, AMS, and blood in the BMs today noticed by staff at his SNF.  Patient was discharged in early May after hospitalization where he was encephalopathic, developed proctocolitis and associated septic shock and hypoxic/hypercarbic respiratory failure.    Assessment / Plan / Recommendation  Clinical Impression  Pt was seen for a clinical swallow evaluation and presents with mild oropharyngeal dysphagia with a suspected esophageal component.  He exhibited an immediate and delayed cough with thin liquid with consistent belching.  No overt s/sx of aspiration were observed with nectar-thick liquid, puree, or regular solids.  Mastication of solids was timely with good oral clearance.   Recommend initiation of regular solids and nectar-thick liquids with medication administered whole in puree.  SLP will f/u to monitor diet tolerance and adjust as appropriate.  SLP Visit Diagnosis: Dysphagia, oropharyngeal phase (R13.12)    Aspiration Risk  Mild aspiration risk    Diet Recommendation Regular;Nectar-thick liquid   Liquid Administration via: Cup;Straw Medication Administration: Whole meds with puree Supervision: Staff to assist with self feeding Compensations: Minimize environmental distractions;Slow rate;Small sips/bites Postural Changes: Seated upright at 90 degrees    Other  Recommendations Oral Care Recommendations: Oral care BID Caregiver Recommendations: Remove water pitcher;Avoid jello, ice cream, thin soups, popsicles    Recommendations for follow up therapy are one component of a multi-disciplinary discharge planning process, led by the attending physician.  Recommendations may be updated based on patient status, additional functional criteria and insurance authorization.  Follow up Recommendations Skilled nursing-short term rehab (<3 hours/day)      Assistance Recommended at Discharge    Functional Status Assessment Patient has had a recent decline in their functional status and demonstrates the ability to make significant improvements in function in a reasonable and predictable amount of time.  Frequency and Duration min 2x/week  2 weeks       Prognosis Prognosis for improved oropharyngeal function: Good      Swallow Study   General Date of Onset: 10/13/22 HPI: Brian Silva is a 82 y.o. male with PMH reviewed presents to the ED for evaluation of weakness, AMS, and blood in the BMs today noticed by staff at his SNF.  Patient was discharged in early May after  hospitalization where he was encephalopathic, developed proctocolitis and associated septic shock and hypoxic/hypercarbic respiratory failure. Type of Study: Bedside Swallow Evaluation Previous  Swallow Assessment: N/A Diet Prior to this Study: Regular;Mildly thick liquids (Level 2, nectar thick) Temperature Spikes Noted: No Respiratory Status: Room air History of Recent Intubation: No Behavior/Cognition: Alert;Cooperative;Pleasant mood Oral Cavity Assessment: Within Functional Limits Oral Care Completed by SLP: No Oral Cavity - Dentition: Adequate natural dentition;Missing dentition Vision: Functional for self-feeding Self-Feeding Abilities: Needs assist Patient Positioning: Upright in bed Baseline Vocal Quality: Normal Volitional Cough: Strong Volitional Swallow: Able to elicit    Oral/Motor/Sensory Function Overall Oral Motor/Sensory Function: Within functional limits   Ice Chips Ice chips: Within functional limits Presentation: Cup   Thin Liquid Thin Liquid: Impaired Presentation: Straw;Spoon Pharyngeal  Phase Impairments: Suspected delayed Swallow;Cough - Delayed;Cough - Immediate    Nectar Thick Nectar Thick Liquid: Within functional limits Presentation: Straw   Honey Thick Honey Thick Liquid: Not tested   Puree Puree: Within functional limits Presentation: Spoon   Solid     Solid: Within functional limits Presentation: Spoon     Eino Farber, M.S., CCC-SLP Acute Rehabilitation Services Office: [phone]  Shanon Rosser Nithila Sumners 10/14/2022,9:22 AM

## 2022-10-15 DIAGNOSIS — J9611 Chronic respiratory failure with hypoxia: Secondary | ICD-10-CM | POA: Diagnosis not present

## 2022-10-15 DIAGNOSIS — G9341 Metabolic encephalopathy: Secondary | ICD-10-CM | POA: Diagnosis not present

## 2022-10-15 DIAGNOSIS — I5032 Chronic diastolic (congestive) heart failure: Secondary | ICD-10-CM | POA: Diagnosis not present

## 2022-10-15 DIAGNOSIS — N179 Acute kidney failure, unspecified: Secondary | ICD-10-CM | POA: Diagnosis not present

## 2022-10-15 LAB — RENAL FUNCTION PANEL
Albumin: 2.6 g/dL — ABNORMAL LOW (ref 3.5–5.0)
Albumin: 2.8 g/dL — ABNORMAL LOW (ref 3.5–5.0)
Anion gap: 9 (ref 5–15)
Anion gap: 7 (ref 5–15)
BUN: 33 mg/dL — ABNORMAL HIGH (ref 8–23)
BUN: 32 mg/dL — ABNORMAL HIGH (ref 8–23)
CO2: 23 mmol/L (ref 22–32)
CO2: 24 mmol/L (ref 22–32)
Calcium: 7.8 mg/dL — ABNORMAL LOW (ref 8.9–10.3)
Calcium: 8.1 mg/dL — ABNORMAL LOW (ref 8.9–10.3)
Chloride: 104 mmol/L (ref 98–111)
Chloride: 104 mmol/L (ref 98–111)
Creatinine, Ser: 1.4 mg/dL — ABNORMAL HIGH (ref 0.61–1.24)
Creatinine, Ser: 1.91 mg/dL — ABNORMAL HIGH (ref 0.61–1.24)
GFR, Estimated: 51 mL/min — ABNORMAL LOW (ref >60)
GFR, Estimated: 35 mL/min — ABNORMAL LOW (ref >60)
Glucose, Bld: 103 mg/dL — ABNORMAL HIGH (ref 70–99)
Glucose, Bld: 102 mg/dL — ABNORMAL HIGH (ref 70–99)
Phosphorus: 2.4 mg/dL — ABNORMAL LOW (ref 2.5–4.6)
Phosphorus: 2.4 mg/dL — ABNORMAL LOW (ref 2.5–4.6)
Potassium: 4.5 mmol/L (ref 3.5–5.1)
Potassium: 4.5 mmol/L (ref 3.5–5.1)
Sodium: 136 mmol/L (ref 135–145)
Sodium: 135 mmol/L (ref 135–145)

## 2022-10-15 LAB — CULTURE, BLOOD (ROUTINE X 2): Special Requests: ADEQUATE

## 2022-10-15 LAB — APTT: aPTT: 49 seconds — ABNORMAL HIGH (ref 24–36)

## 2022-10-15 LAB — GLUCOSE, CAPILLARY
Glucose-Capillary: 100 mg/dL — ABNORMAL HIGH (ref 70–99)
Glucose-Capillary: 102 mg/dL — ABNORMAL HIGH (ref 70–99)
Glucose-Capillary: 119 mg/dL — ABNORMAL HIGH (ref 70–99)
Glucose-Capillary: 78 mg/dL (ref 70–99)
Glucose-Capillary: 90 mg/dL (ref 70–99)
Glucose-Capillary: 99 mg/dL (ref 70–99)

## 2022-10-15 LAB — HEPATITIS B SURFACE ANTIBODY, QUANTITATIVE: Hep B S AB Quant (Post): <3.5 m[IU]/mL — ABNORMAL LOW (ref >9.9)

## 2022-10-15 LAB — MAGNESIUM: Magnesium: 2.1 mg/dL (ref 1.7–2.4)

## 2022-10-15 MED ORDER — METOCLOPRAMIDE HCL 5 MG/ML IJ SOLN
5.0000 mg | Freq: Four times a day (QID) | INTRAMUSCULAR | Status: DC | PRN
  Administered 2022-10-15: 5 mg via INTRAVENOUS
  Filled 2022-10-15: qty 2

## 2022-10-15 MED ORDER — LINEZOLID 600 MG/300ML IV SOLN
600.0000 mg | Freq: Two times a day (BID) | INTRAVENOUS | Status: DC
  Administered 2022-10-15: 600 mg via INTRAVENOUS
  Filled 2022-10-15 (×2): qty 300

## 2022-10-15 MED ORDER — MIDODRINE HCL 5 MG PO TABS: 10.0000 mg | ORAL_TABLET | Freq: Three times a day (TID) | ORAL | Status: DC

## 2022-10-15 MED ORDER — ENOXAPARIN SODIUM 30 MG/0.3ML IJ SOSY
30.0000 mg | PREFILLED_SYRINGE | INTRAMUSCULAR | Status: DC
  Administered 2022-10-15 – 2022-10-17 (×3): 30 mg via SUBCUTANEOUS
  Filled 2022-10-15 (×3): qty 0.3

## 2022-10-15 NOTE — Progress Notes (Signed)
Speech Language Pathology Treatment: Dysphagia  Patient Details Name: Brian Silva MRN: [id number] DOB: February 13, 1941 Today's Date: 10/15/2022 Time: 4098-1191 SLP Time Calculation (min) (ACUTE ONLY): 8 min  Assessment / Plan / Recommendation Clinical Impression  Pt seen for ongoing dysphagia management.  Pt tolerated puree and regular texture solids with no clinical s/s of aspiration.  Pt exhibited single, mild, dry throat clear over all trials of thin liquid.  Vocal quality remained strong and there were no changes to vital signs.  Chest imaging 6/22 with clear lungs.  Will advance liquids to thin, but if pt exhibits increased difficulty, please notify SLP and change back to nectar thick.  Recommend regular texture diet with thin liquids.    HPI HPI: Brian Silva is a 82 y.o. male with PMH reviewed presents to the ED for evaluation of weakness, AMS, and blood in the BMs today noticed by staff at his SNF.  Patient was discharged in early May after hospitalization where he was encephalopathic, developed proctocolitis and associated septic shock and hypoxic/hypercarbic respiratory failure.      SLP Plan  Continue with current plan of care      Recommendations for follow up therapy are one component of a multi-disciplinary discharge planning process, led by the attending physician.  Recommendations may be updated based on patient status, additional functional criteria and insurance authorization.    Recommendations  Diet recommendations: Regular;Thin liquid Liquids provided via: Cup;Straw Medication Administration: Whole meds with puree Supervision: Patient able to self feed (assist prn) Compensations: Minimize environmental distractions;Slow rate;Small sips/bites Postural Changes and/or Swallow Maneuvers: Seated upright 90 degrees                  Oral care BID    (N/A) Dysphagia, oropharyngeal phase (R13.12)     Continue with current plan of care     Kerrie Pleasure,  MA, CCC-SLP Acute Rehabilitation Services Office: [phone] 10/15/2022, 11:30 AM

## 2022-10-15 NOTE — Progress Notes (Signed)
PT Cancellation Note  Patient Details Name: [name] MRN: [id number] DOB: [birth year]   Cancelled Treatment:    Reason Eval/Treat Not Completed: PT screened, no needs identified, will sign off (Pt long term resident at [facility] requiring assist for all ADLs and is hoyer lifted from bed to [facility]. Pt at baseline functional level and encouraged to continue to help with rolling and mobility acutely. No further therapy needs at this time with pt aware and agreeable)   Brandan Glauber B Nykeria Mealing 10/15/2022, 1:29 PM Merryl Hacker, PT Acute Rehabilitation Services Office: [phone]

## 2022-10-15 NOTE — Progress Notes (Signed)
   10/14/22 2340  BiPAP/CPAP/SIPAP  BiPAP/CPAP/SIPAP Pt Type Adult  BiPAP/CPAP/SIPAP DREAMSTATIOND  Reason BIPAP/CPAP not in use Nausea/Vomiting  BiPAP/CPAP /SiPAP Vitals  Pulse Rate 78  Resp 18  SpO2 96 %  MEWS Score/Color  MEWS Score 0  MEWS Score Color Green   RT did not place patient on CPAP HS due to patient being nauseated. Patient on Vevay tolerating well.

## 2022-10-15 NOTE — Progress Notes (Signed)
   10/15/22 2145  BiPAP/CPAP/SIPAP  BiPAP/CPAP/SIPAP Pt Type Adult  BiPAP/CPAP/SIPAP DREAMSTATIOND  Reason BIPAP/CPAP not in use Non-compliant  BiPAP/CPAP /SiPAP Vitals  Pulse Rate 73  Resp 15  SpO2 96 %  MEWS Score/Color  MEWS Score 0  MEWS Score Color Green   [name]. Patient stated he did not want to wear tonight.

## 2022-10-15 NOTE — Progress Notes (Signed)
NAME:  Brian Silva, MRN:  [id number], DOB:  Mar 01, 1941, LOS: 2 ADMISSION DATE:  10/13/2022, CONSULTATION DATE: 10/13/2022 REFERRING MD: Madelyn Flavors, CHIEF COMPLAINT: Altered mental status  History of Present Illness:  82 year old morbidly obese male with chronic hypoxic respiratory failure on 2 L oxygen, hypertension, hyperlipidemia and CKD stage IIIa who was admitted last month for proctocolitis and septic shock, improved had a colonoscopy which showed multiple polyps which were removed, noted to be tubular adenoma, negative for high-grade dysplasia, who presented to the emergency department with a complaint of bloody bowel movement as per patient he was in usual state of health this morning, when he went to have regular BM, he noted blood on his stool so he came to the emergency department for evaluation in the ED he was found to be lethargic with generalized tremors, workup showed AKI on CKD3a, metabolic acidosis and hyperkalemia requiring treatment.  He was given IV fluid.  Nephrology was consulted patient was found to be uremic with volume overload, nephrology decided to start CRRT to help with uremia, volume overload and hyperkalemia PCCM was consulted for help evaluation medical management  Patient denies abdominal pain, nausea, vomiting, headache, fever, chills or other complaints  Pertinent  Medical History   Past Medical History:  Diagnosis Date   Asthma    Chronic respiratory failure (HCC)    on 2L oxygen   Hyperlipidemia    Hypertension    Obese    OSA (obstructive sleep apnea)    Sleep study 08/2010 showed mild to moderate obstructive sleep apnea/hypopnea syndrome. However, never initiated CPAP   Venous stasis    Venous Statis Disease (03/28/2004)     Significant Hospital Events: Including procedures, antibiotic start and stop dates in addition to other pertinent events     Interim History / Subjective:  Patient is complaining of nausea, stated vomited  twice Remained afebrile Still requiring vasopressor support with Levophed at 1 mics CRRT was stopped this morning  Objective   Blood pressure (!) 132/55, pulse 77, temperature 98.1 F (36.7 C), temperature source Oral, resp. rate 18, height 6' (1.829 m), weight (!) 146 kg, SpO2 98 %.        Intake/Output Summary (Last 24 hours) at 10/15/2022 0738 Last data filed at 10/15/2022 0700 Gross per 24 hour  Intake 1527.51 ml  Output 4560.9 ml  Net -3033.39 ml   Filed Weights   10/13/22 0057 10/14/22 0500 10/15/22 0500  Weight: (!) 150.7 kg (!) 148.6 kg (!) 146 kg    Examination: Physical exam: General: Chronically ill-appearing male, lying on the bed HEENT: [place]/AT, eyes anicteric.  moist mucus membranes Neuro: Alert, awake following commands, fine tremors upper extremities has resolved Chest: Coarse breath sounds, no wheezes or rhonchi Heart: Regular rate and rhythm, no murmurs or gallops Abdomen: Soft, obese, bowel sounds present Skin: No rash  Labs and images were reviewed  Resolved Hospital Problem list     Assessment & Plan:  AKI on CKD3 a Hyperkalemia, resolved High anion gap metabolic acidosis, resolved Uremic encephalopathy, improved Patient mental status has improved significantly but intermittently gets confused Fine tremors resulting from uremic encephalopathy are improving BUN has trended down to 33 CRRT was stopped this morning per nephrology recommendations He is making urine, made 1.4 L in last 24 hours Nephrology is following Serum bicarbonate has improved, back to normal Serum potassium is down to 4.5  Shock, multifactorial including acidosis/sepsis Staph hemolyticus bacteremia versus contamination Patient continued to require low-dose vasopressor support, currently  on Levophed at 1 His blood culture is growing staph hemolyticus in 2 out of 2 bottles Repeat cultures have been negative Switch vancomycin to Zyvox Stop Zosyn  Chronic hypoxic respiratory  failure on home oxygen Obstructive sleep apnea, per patient he is not on CPAP at night Currently on room air Continue BiPAP at night Monitor O2 sat with a goal 92%  Morbid obesity Continue heart healthy diet  Chronic HFpEF Monitor intake and output  Diabetes type 2 Monitor fingerstick goal 140-180 Continue sliding scale  Best Practice (right click and "Reselect all SmartList Selections" daily)   Diet/type: Regular consistency DVT prophylaxis: Subcu Lovenox GI prophylaxis: N/A Lines: Dialysis Catheter Foley:  Yes, and it is still needed Code Status:  full code Last date of multidisciplinary goals of care discussion 6/23: With the patient, he would like to continue full scope of care]  Labs   CBC: Recent Labs  Lab 10/13/22 0103 10/13/22 1128 10/13/22 1138 10/14/22 0346  WBC 5.4  --   --  4.8  NEUTROABS 4.0  --   --   --   HGB 9.0* 8.2* 8.1* 7.7*  HCT 29.4* 24.0* 25.8* 23.9*  MCV 95.1  --   --  90.5  PLT 240  --   --  194    Basic Metabolic Panel: Recent Labs  Lab 10/13/22 0405 10/13/22 1128 10/13/22 1138 10/13/22 1633 10/14/22 0346 10/14/22 1528 10/15/22 0428  NA 137   < > 139 140 139 138 136  K 6.3*   < > 5.4* 5.5* 5.1 4.9 4.5  CL 112*  --  111 109 109 106 104  CO2 13*  --  14* 16* 20* 22 23  GLUCOSE 96  --  92 103* 126* 116* 103*  BUN 165*  --  165* 159* 102* 53* 33*  CREATININE 4.92*  --  4.74* 4.49* 2.84* 1.82* 1.40*  CALCIUM 7.6*  --  8.0* 8.2* 7.6* 8.0* 7.8*  MG 1.9  --   --   --  1.9  --  2.1  PHOS 7.4*  --   --  6.5* 4.3 2.4* 2.4*   < > = values in this interval not displayed.   GFR: Estimated Creatinine Clearance: 63.5 mL/min (A) (by C-G formula based on SCr of 1.4 mg/dL (H)). Recent Labs  Lab 10/13/22 0103 10/13/22 0136 10/13/22 0405 10/14/22 0346  WBC 5.4  --   --  4.8  LATICACIDVEN  --  0.7 0.7  --     Liver Function Tests: Recent Labs  Lab 10/13/22 0103 10/13/22 1633 10/14/22 0346 10/14/22 1528 10/15/22 0428  AST 15  --    --   --   --   ALT 15  --   --   --   --   ALKPHOS 49  --   --   --   --   BILITOT 0.5  --   --   --   --   PROT 6.2*  --   --   --   --   ALBUMIN 3.0* 2.7* 2.7* 2.7* 2.6*   Recent Labs  Lab 10/13/22 0103  LIPASE 39   Recent Labs  Lab 10/13/22 0136  AMMONIA 19    ABG    Component Value Date/Time   PHART 7.212 (L) 08/22/2022 2144   PCO2ART 61.3 (H) 08/22/2022 2144   PO2ART 97 08/22/2022 2144   HCO3 12.3 (L) 10/13/2022 1128   TCO2 13 (L) 10/13/2022 1128   ACIDBASEDEF 12.0 (H)  10/13/2022 1128   O2SAT 96 10/13/2022 1128     Coagulation Profile: Recent Labs  Lab 10/13/22 0103  INR 1.2    Cardiac Enzymes: Recent Labs  Lab 10/13/22 0103  CKTOTAL 338    HbA1C: Hgb A1c MFr Bld  Date/Time Value Ref Range Status  08/23/2022 06:15 AM 6.2 (H) 4.8 - 5.6 % Final    Comment:    (NOTE) Pre diabetes:          5.7%-6.4%  Diabetes:              >6.4%  Glycemic control for   <7.0% adults with diabetes   12/23/2019 05:11 AM 5.7 (H) 4.8 - 5.6 % Final    Comment:    (NOTE) Pre diabetes:          5.7%-6.4%  Diabetes:              >6.4%  Glycemic control for   <7.0% adults with diabetes     CBG: Recent Labs  Lab 10/14/22 1121 10/14/22 1534 10/14/22 1936 10/14/22 2332 10/15/22 0430  GLUCAP 107* 111* 116* 103* 78   Critical care time:      The patient is critically ill due to acute renal failure, uremic encephalopathy, shock, bacteremia.  Critical care was necessary to treat or prevent imminent or life-threatening deterioration.  Critical care was time spent personally by me on the following activities: development of treatment plan with patient and/or surrogate as well as nursing, discussions with consultants, evaluation of patient's response to treatment, examination of patient, obtaining history from patient or surrogate, ordering and performing treatments and interventions, ordering and review of laboratory studies, ordering and review of radiographic  studies, pulse oximetry, re-evaluation of patient's condition and participation in multidisciplinary rounds.   During this encounter critical care time was devoted to patient care services described in this note for 34 minutes.    Cheri Fowler, MD [facility] See Amion for pager If no response to pager, please call [phone] until 7pm After 7pm, Please call E-link [phone]

## 2022-10-15 NOTE — Progress Notes (Signed)
[facility] Progress Note    Assessment/ Plan:   # Acute kidney injury on CKD IIIb (b/l cr 1.4-1.6): Likely ischemic ATN in the setting of shock (SBP 70-80) associated with the use of lisinopril, possible GI bleed etc.  CT scan without hydronephrosis.  UA bland. Started CRRT on 6/22 for uremic encephalopathy and AKI.  Remains non-oliguric, mental status seems to have improved.  -Hold off on restarting CRRT today. Will monitor for sustained recovery and will assess daily for IHD needs. Maintain temp HD catheter for now -Continue strict ins and out, lab monitoring.  Discussed with ICU team.   # Severe hyperkalemia due to AKI and ACE inhibitor: Received medical treatment in ER and CRRT. Resolved   # Anion gap metabolic acidosis with renal failure, also on metformin at home (on hold here).  Improved with CRRT..   # Hypotension/shock possible septic: on levophed-per CCM.  Bcx staph hemolyticus 2/2 bottles. Abx per primary service-on zyvox   # Chronic CHF: s/p UF with CRRT. Volume status stable  Subjective:   Patient seen and examined bedside in ICU. CRRT recently clotted off. UOP ~1.3L. K down to 4.5. net neg ~3L Patient reports nausea/vomiting but otherwise denies any chest pain, SOB, dysgeusia. On low dose levo   Objective:   BP (!) 132/55   Pulse 77   Temp 98.1 F (36.7 C) (Oral)   Resp 18   Ht 6' (1.829 m)   Wt (!) 146 kg   SpO2 98%   BMI 43.65 kg/m   Intake/Output Summary (Last 24 hours) at 10/15/2022 0748 Last data filed at 10/15/2022 0700 Gross per 24 hour  Intake 1527.51 ml  Output 4560.9 ml  Net -3033.39 ml   Weight change: -2.6 kg  Physical Exam: Gen: NAD CVS: RRR Resp: CTA B/L Abd: soft, nt/nd Ext: no sig edema b/l Les Neuro: awake, alert Dialysis access: RIJ temp HD cath c/d/i  Imaging: DG CHEST PORT 1 VIEW  Result Date: 10/13/2022 CLINICAL DATA:  Central line placement. EXAM: PORTABLE CHEST 1 VIEW COMPARISON:  October 13, 2022. FINDINGS:  Stable cardiomegaly. Interval placement of right internal jugular catheter with distal tip in expected position of the SVC. No pneumothorax is noted. Lungs are clear. Bony thorax is unremarkable. IMPRESSION: Interval placement of right internal jugular catheter with distal tip in expected position of the SVC. Electronically Signed   By: Lupita Raider M.D.   On: 10/13/2022 16:49    Labs: BMET Recent Labs  Lab 10/13/22 0103 10/13/22 0405 10/13/22 1128 10/13/22 1138 10/13/22 1633 10/14/22 0346 10/14/22 1528 10/15/22 0428  NA 139 137 138 139 140 139 138 136  K 6.8* 6.3* 5.9* 5.4* 5.5* 5.1 4.9 4.5  CL 108 112*  --  111 109 109 106 104  CO2 11* 13*  --  14* 16* 20* 22 23  GLUCOSE 96 96  --  92 103* 126* 116* 103*  BUN 172* 165*  --  165* 159* 102* 53* 33*  CREATININE 5.39* 4.92*  --  4.74* 4.49* 2.84* 1.82* 1.40*  CALCIUM 8.2* 7.6*  --  8.0* 8.2* 7.6* 8.0* 7.8*  PHOS  --  7.4*  --   --  6.5* 4.3 2.4* 2.4*   CBC Recent Labs  Lab 10/13/22 0103 10/13/22 1128 10/13/22 1138 10/14/22 0346  WBC 5.4  --   --  4.8  NEUTROABS 4.0  --   --   --   HGB 9.0* 8.2* 8.1* 7.7*  HCT 29.4* 24.0* 25.8*  23.9*  MCV 95.1  --   --  90.5  PLT 240  --   --  194    Medications:     atorvastatin  20 mg Oral QHS   Chlorhexidine Gluconate Cloth  6 each Topical Q0600   cyanocobalamin  500 mcg Oral Daily   feeding supplement (GLUCERNA SHAKE)  237 mL Oral TID BM   folic acid  1 mg Oral Daily   insulin aspart  0-15 Units Subcutaneous Q4H   multivitamin  1 tablet Oral QHS   mupirocin ointment  1 Application Nasal BID   mouth rinse  15 mL Mouth Rinse 4 times per day   pantoprazole (PROTONIX) IV  40 mg Intravenous Q24H   rOPINIRole  1 mg Oral QPM   sodium chloride flush  10-40 mL Intracatheter Q12H   sodium chloride flush  3 mL Intravenous Q12H   tamsulosin  0.4 mg Oral Daily      Anthony Sar, MD [facility] 10/15/2022, 7:48 AM

## 2022-10-15 NOTE — TOC Initial Note (Signed)
Transition of Care [facility]) - Initial/Assessment Note    Patient Details  Name: Brian Silva MRN: [id number] Date of Birth: 06-Oct-1940  Transition of Care [facility]) CM/SW Contact:    Elliot Cousin, RN Phone Number: [phone] 10/15/2022, 5:07 PM  Clinical Narrative:   CM spoke to pt and states he is a resident at [facility] and wants to return. Will have TOC CSW follow up on return to [facility] once stable.                 Expected Discharge Plan: Skilled Nursing Facility Barriers to Discharge: Continued Medical Work up   Patient Goals and CMS Choice Patient states their goals for this hospitalization and ongoing recovery are:: patient reports he wants to return to SNF LTC CMS Medicare.gov Compare Post Acute Care list provided to:: Patient Choice offered to / list presented to : Patient      Expected Discharge Plan and Services In-house Referral: Clinical Social Work Discharge Planning Services: CM Consult Post Acute Care Choice: Skilled Nursing Facility Living arrangements for the past 2 months: Skilled Nursing Facility                                      Prior Living Arrangements/Services Living arrangements for the past 2 months: Skilled Nursing Facility Lives with:: Facility Resident Patient language and need for interpreter reviewed:: Yes Do you feel safe going back to the place where you live?: Yes      Need for Family Participation in Patient Care: Yes (Comment) Care giver support system in place?: Yes (comment)   Criminal Activity/Legal Involvement Pertinent to Current Situation/Hospitalization: No - Comment as needed  Activities of Daily Living Home Assistive Devices/Equipment: Oxygen ADL Screening (condition at time of admission) Patient's cognitive ability adequate to safely complete daily activities?: No Is the patient deaf or have difficulty hearing?: No Does the patient have difficulty seeing, even when wearing glasses/contacts?:  No Does the patient have difficulty concentrating, remembering, or making decisions?: Yes Patient able to express need for assistance with ADLs?: Yes Does the patient have difficulty dressing or bathing?: Yes Independently performs ADLs?: No Communication: Independent Dressing (OT): Dependent Is this a change from baseline?: Pre-admission baseline Grooming: Dependent Is this a change from baseline?: Pre-admission baseline Feeding: Needs assistance Is this a change from baseline?: Pre-admission baseline Bathing: Dependent Is this a change from baseline?: Pre-admission baseline Toileting: Needs assistance Is this a change from baseline?: Pre-admission baseline In/Out Bed: Dependent Is this a change from baseline?: Pre-admission baseline Walks in Home: Dependent Is this a change from baseline?: Pre-admission baseline Does the patient have difficulty walking or climbing stairs?: Yes Weakness of Legs: Both Weakness of Arms/Hands: Both  Permission Sought/Granted Permission sought to share information with : Case Manager                Emotional Assessment Appearance:: Appears stated age Attitude/Demeanor/Rapport: Engaged Affect (typically observed): Accepting Orientation: : Oriented to Self, Oriented to Place, Oriented to  Time, Oriented to Situation   Psych Involvement: No (comment)  Admission diagnosis:  Hyperkalemia [E87.5] Acute renal failure (ARF) (HCC) [N17.9] AKI (acute kidney injury) (HCC) [N17.9] Acute kidney injury superimposed on chronic kidney disease (HCC) [N17.9, N18.9] Patient Active Problem List   Diagnosis Date Noted   Transient hypotension 10/13/2022   Normocytic anemia 10/13/2022   BPH (benign prostatic hyperplasia) 10/13/2022   Heart failure  with preserved ejection fraction (HCC) 10/13/2022   Chronic respiratory failure with hypoxia (HCC) 10/13/2022   Acute renal failure (ARF) (HCC) 10/13/2022   Benign neoplasm of transverse colon 08/29/2022   Benign  neoplasm of descending colon 08/29/2022   Benign neoplasm of sigmoid colon 08/29/2022   Abnormal CT scan, colon 08/28/2022   ABLA (acute blood loss anemia) 08/26/2022   Heme positive stool 08/26/2022   Proctocolitis 08/25/2022   Encephalopathy acute 08/25/2022   Sepsis with encephalopathy and septic shock (HCC) 08/25/2022   Gastrointestinal hemorrhage 08/25/2022   Acute on chronic respiratory failure with hypoxia and hypercapnia (HCC) 08/24/2022   Acute metabolic encephalopathy 08/24/2022   Septic shock (HCC) 08/22/2022   Weakness 09/10/2021   Cervical radiculopathy 09/10/2021   Community acquired pneumonia 09/09/2021   Bacteremia    Acute renal failure superimposed on stage 2 chronic kidney disease (HCC) 07/06/2020   Metabolic acidosis with increased anion gap and reduced excretion of inorganic acids 07/06/2020   Uremia 07/06/2020   Hyperkalemia 07/06/2020   Chronic diastolic CHF (congestive heart failure) (HCC) 07/06/2020   Pneumonia of both lower lobes due to infectious organism 12/23/2019   CAP (community acquired pneumonia) 12/22/2019   Compression fracture of body of thoracic vertebra (HCC) 09/10/2019   Fall at home, initial encounter 09/10/2019   Acute kidney injury superimposed on chronic kidney disease (HCC) 01/17/2018   Syncope, vasovagal 01/17/2018   Diabetes mellitus type II, non insulin dependent (HCC) 01/17/2018   Hyponatremia 01/17/2018   Syncope 01/17/2018   Depression 10/25/2015   Hyperlipidemia 10/25/2015   TINEA PEDIS 01/04/2010   TINEA VERSICOLOR 01/04/2010   URI 01/04/2010   HYPERGLYCEMIA 12/02/2008   ABDOMINAL WALL HERNIA 12/01/2007   OBESITY, MORBID 04/04/2007   SLEEP APNEA, OBSTRUCTIVE 04/04/2007   LOW BACK PAIN, CHRONIC 12/07/2004   HYPERLIPIDEMIA 08/12/2002   ALLERGIC RHINITIS, SEASONAL 08/12/2002   Essential hypertension 04/01/2000   Asthma 10/11/1997   SUPERFICIAL PHLEBITIS 05/06/1990   PCP:  Merrilyn Puma, DO Pharmacy:   [id number] [facility] - [place], [place] - [address] [place] [address] Phone: [phone] Fax: [phone]  [facility] - [place], [place] - [address] [place] [address] Phone: [phone] Fax: [phone]     Social Determinants of Health (SDOH) Social History: SDOH Screenings   Food Insecurity: No Food Insecurity (10/15/2022)  Housing: Low Risk  (10/15/2022)  Transportation Needs: No Transportation Needs (10/15/2022)  Utilities: Not At Risk (10/15/2022)  Financial Resource Strain: Low Risk  (01/29/2019)  Physical Activity: Unknown (01/29/2019)  Social Connections: Unknown (01/29/2019)  Stress: No Stress Concern Present (01/29/2019)  Tobacco Use: Low Risk  (10/13/2022)   SDOH Interventions:     Readmission Risk Interventions     No data to display

## 2022-10-16 DIAGNOSIS — G9341 Metabolic encephalopathy: Secondary | ICD-10-CM | POA: Diagnosis not present

## 2022-10-16 DIAGNOSIS — I5032 Chronic diastolic (congestive) heart failure: Secondary | ICD-10-CM | POA: Diagnosis not present

## 2022-10-16 DIAGNOSIS — J9611 Chronic respiratory failure with hypoxia: Secondary | ICD-10-CM | POA: Diagnosis not present

## 2022-10-16 DIAGNOSIS — N179 Acute kidney failure, unspecified: Secondary | ICD-10-CM | POA: Diagnosis not present

## 2022-10-16 LAB — GLUCOSE, CAPILLARY
Glucose-Capillary: 90 mg/dL (ref 70–99)
Glucose-Capillary: 80 mg/dL (ref 70–99)
Glucose-Capillary: 87 mg/dL (ref 70–99)
Glucose-Capillary: 87 mg/dL (ref 70–99)
Glucose-Capillary: 69 mg/dL — ABNORMAL LOW (ref 70–99)
Glucose-Capillary: 96 mg/dL (ref 70–99)
Glucose-Capillary: 92 mg/dL (ref 70–99)

## 2022-10-16 LAB — CULTURE, BLOOD (ROUTINE X 2)

## 2022-10-16 LAB — RENAL FUNCTION PANEL
Albumin: 2.8 g/dL — ABNORMAL LOW (ref 3.5–5.0)
Anion gap: 8 (ref 5–15)
BUN: 33 mg/dL — ABNORMAL HIGH (ref 8–23)
CO2: 23 mmol/L (ref 22–32)
Calcium: 8.4 mg/dL — ABNORMAL LOW (ref 8.9–10.3)
Chloride: 103 mmol/L (ref 98–111)
Creatinine, Ser: 2.08 mg/dL — ABNORMAL HIGH (ref 0.61–1.24)
GFR, Estimated: 32 mL/min — ABNORMAL LOW (ref >60)
Glucose, Bld: 94 mg/dL (ref 70–99)
Phosphorus: 2.8 mg/dL (ref 2.5–4.6)
Potassium: 4.3 mmol/L (ref 3.5–5.1)
Sodium: 134 mmol/L — ABNORMAL LOW (ref 135–145)

## 2022-10-16 LAB — APTT: aPTT: 34 seconds (ref 24–36)

## 2022-10-16 LAB — MAGNESIUM: Magnesium: 2 mg/dL (ref 1.7–2.4)

## 2022-10-16 MED ORDER — SODIUM CHLORIDE 0.9 % IV SOLN: INTRAVENOUS | Status: AC

## 2022-10-16 MED ORDER — FUROSEMIDE 10 MG/ML IJ SOLN
60.0000 mg | Freq: Once | INTRAMUSCULAR | Status: AC
  Administered 2022-10-16: 60 mg via INTRAVENOUS
  Filled 2022-10-16: qty 6

## 2022-10-16 NOTE — Progress Notes (Signed)
   10/16/22 2253  BiPAP/CPAP/SIPAP  Reason BIPAP/CPAP not in use Non-compliant  BiPAP/CPAP /SiPAP Vitals  Pulse Rate 70  Resp 17  SpO2 100 %  Bilateral Breath Sounds Clear;Diminished  MEWS Score/Color  MEWS Score 0  MEWS Score Color [place] Si

## 2022-10-16 NOTE — Progress Notes (Signed)
NAME:  Brian Silva, MRN:  [id number], DOB:  03/07/1941, LOS: 3 ADMISSION DATE:  10/13/2022, CONSULTATION DATE: 10/13/2022 REFERRING MD: Madelyn Flavors, CHIEF COMPLAINT: Altered mental status  History of Present Illness:  82 year old morbidly obese male with chronic hypoxic respiratory failure on 2 L oxygen, hypertension, hyperlipidemia and CKD stage IIIa who was admitted last month for proctocolitis and septic shock, improved had a colonoscopy which showed multiple polyps which were removed, noted to be tubular adenoma, negative for high-grade dysplasia, who presented to the emergency department with a complaint of bloody bowel movement as per patient he was in usual state of health this morning, when he went to have regular BM, he noted blood on his stool so he came to the emergency department for evaluation in the ED he was found to be lethargic with generalized tremors, workup showed AKI on CKD3a, metabolic acidosis and hyperkalemia requiring treatment.  He was given IV fluid.  Nephrology was consulted patient was found to be uremic with volume overload, nephrology decided to start CRRT to help with uremia, volume overload and hyperkalemia PCCM was consulted for help evaluation medical management  Patient denies abdominal pain, nausea, vomiting, headache, fever, chills or other complaints  Pertinent  Medical History   Past Medical History:  Diagnosis Date   Asthma    Chronic respiratory failure (HCC)    on 2L oxygen   Hyperlipidemia    Hypertension    Obese    OSA (obstructive sleep apnea)    Sleep study 08/2010 showed mild to moderate obstructive sleep apnea/hypopnea syndrome. However, never initiated CPAP   Venous stasis    Venous Statis Disease (03/28/2004)     Significant Hospital Events: Including procedures, antibiotic start and stop dates in addition to other pertinent events     Interim History / Subjective:  No overnight complaint Came off of vasopressors CRRT was  stopped yesterday, patient's mental status has improved  Objective   Blood pressure (!) 132/55, pulse 69, temperature 97.8 F (36.6 C), temperature source Oral, resp. rate 15, height 6' (1.829 m), weight (!) 149.3 kg, SpO2 93 %.        Intake/Output Summary (Last 24 hours) at 10/16/2022 0758 Last data filed at 10/16/2022 4098 Gross per 24 hour  Intake 1388.84 ml  Output 450 ml  Net 938.84 ml   Filed Weights   10/14/22 0500 10/15/22 0500 10/16/22 0500  Weight: (!) 148.6 kg (!) 146 kg (!) 149.3 kg    Examination: Physical exam: General: Elderly morbidly obese male, lying on the bed HEENT: [place]/AT, eyes anicteric.  moist mucus membranes Neuro: Alert, awake following commands Chest: Coarse breath sounds, no wheezes or rhonchi Heart: Regular rate and rhythm, no murmurs or gallops Abdomen: Soft, nontender, nondistended, bowel sounds present Skin: No rash  Labs and images were reviewed  Resolved Hospital Problem list   Hypokalemia High anion gap metabolic acidosis Acidosis related shock  Assessment & Plan:  AKI on CKD3 a Uremic encephalopathy, improved Patient mental status has improved, now back to baseline Fine tremors resulting from uremic encephalopathy are improving BUN has trended down to 33 CRRT was stopped yesterday, nephrology is following Again became oliguric with urine output of 450cc in last 24 hours Acidosis and hyperkalemia has resolved Serum creatinine slightly increased from 1.92  Sepsis was ruled out Shock was related to acidosis Staph hemolyticus contamination Patient came off vasopressor support once acidosis improved Repeat blood cultures have been negative Staph hemolyticus was contaminant, drawn from same site Antibiotics  were stopped, he remained afebrile  Chronic hypoxic respiratory failure on home oxygen Obstructive sleep apnea, per patient he is not on CPAP at night Currently on room air Continue BiPAP at night Monitor O2 sat with a goal  92%  Morbid obesity Continue heart healthy diet  Chronic HFpEF Monitor intake and output  Diabetes type 2 Monitor fingerstick goal 140-180 Continue sliding scale  Best Practice (right click and "Reselect all SmartList Selections" daily)   Diet/type: Regular consistency DVT prophylaxis: Subcu Lovenox GI prophylaxis: N/A Lines: Dialysis Catheter Foley:  Yes, and it is still needed Code Status:  full code Last date of multidisciplinary goals of care discussion 6/23: With the patient, he would like to continue full scope of care]  Labs   CBC: Recent Labs  Lab 10/13/22 0103 10/13/22 1128 10/13/22 1138 10/14/22 0346  WBC 5.4  --   --  4.8  NEUTROABS 4.0  --   --   --   HGB 9.0* 8.2* 8.1* 7.7*  HCT 29.4* 24.0* 25.8* 23.9*  MCV 95.1  --   --  90.5  PLT 240  --   --  194    Basic Metabolic Panel: Recent Labs  Lab 10/13/22 0405 10/13/22 1128 10/14/22 0346 10/14/22 1528 10/15/22 0428 10/15/22 1611 10/16/22 0353  NA 137   < > 139 138 136 135 134*  K 6.3*   < > 5.1 4.9 4.5 4.5 4.3  CL 112*   < > 109 106 104 104 103  CO2 13*   < > 20* 22 23 24 23   GLUCOSE 96   < > 126* 116* 103* 102* 94  BUN 165*   < > 102* 53* 33* 32* 33*  CREATININE 4.92*   < > 2.84* 1.82* 1.40* 1.91* 2.08*  CALCIUM 7.6*   < > 7.6* 8.0* 7.8* 8.1* 8.4*  MG 1.9  --  1.9  --  2.1  --  2.0  PHOS 7.4*   < > 4.3 2.4* 2.4* 2.4* 2.8   < > = values in this interval not displayed.   GFR: Estimated Creatinine Clearance: 43.3 mL/min (A) (by C-G formula based on SCr of 2.08 mg/dL (H)). Recent Labs  Lab 10/13/22 0103 10/13/22 0136 10/13/22 0405 10/14/22 0346  WBC 5.4  --   --  4.8  LATICACIDVEN  --  0.7 0.7  --     Liver Function Tests: Recent Labs  Lab 10/13/22 0103 10/13/22 1633 10/14/22 0346 10/14/22 1528 10/15/22 0428 10/15/22 1611 10/16/22 0353  AST 15  --   --   --   --   --   --   ALT 15  --   --   --   --   --   --   ALKPHOS 49  --   --   --   --   --   --   BILITOT 0.5  --   --   --    --   --   --   PROT 6.2*  --   --   --   --   --   --   ALBUMIN 3.0*   < > 2.7* 2.7* 2.6* 2.8* 2.8*   < > = values in this interval not displayed.   Recent Labs  Lab 10/13/22 0103  LIPASE 39   Recent Labs  Lab 10/13/22 0136  AMMONIA 19    ABG    Component Value Date/Time   PHART 7.212 (L) 08/22/2022 2144  PCO2ART 61.3 (H) 08/22/2022 2144   PO2ART 97 08/22/2022 2144   HCO3 12.3 (L) 10/13/2022 1128   TCO2 13 (L) 10/13/2022 1128   ACIDBASEDEF 12.0 (H) 10/13/2022 1128   O2SAT 96 10/13/2022 1128     Coagulation Profile: Recent Labs  Lab 10/13/22 0103  INR 1.2    Cardiac Enzymes: Recent Labs  Lab 10/13/22 0103  CKTOTAL 338    HbA1C: Hgb A1c MFr Bld  Date/Time Value Ref Range Status  08/23/2022 06:15 AM 6.2 (H) 4.8 - 5.6 % Final    Comment:    (NOTE) Pre diabetes:          5.7%-6.4%  Diabetes:              >6.4%  Glycemic control for   <7.0% adults with diabetes   12/23/2019 05:11 AM 5.7 (H) 4.8 - 5.6 % Final    Comment:    (NOTE) Pre diabetes:          5.7%-6.4%  Diabetes:              >6.4%  Glycemic control for   <7.0% adults with diabetes     CBG: Recent Labs  Lab 10/15/22 1557 10/15/22 2013 10/15/22 2344 10/16/22 0350 10/16/22 0728  GLUCAP 99 119* 102* 90 80      Cheri Fowler, MD [facility] See Amion for pager If no response to pager, please call [phone] until 7pm After 7pm, Please call E-link [phone]

## 2022-10-16 NOTE — Progress Notes (Signed)
[facility] Progress Note    Assessment/ Plan:   # Acute kidney injury on CKD IIIb (b/l cr 1.4-1.6): Likely ischemic ATN in the setting of shock (SBP 70-80) associated with the use of lisinopril, possible GI bleed etc.  CT scan without hydronephrosis.  UA bland. Started CRRT on 6/22 for uremic encephalopathy and AKI. Mental status improved and continues to be stable -Cr relatively stable at 2.08 today but urine output did drop off. I do question his PO intake therefore will trial him with isotonic fluids: NS @75cc /hr x 1 day -No indication for HD today. Hopefully can sustain renal recovery. Will maintain his HD catheter for now. Monitor daily labs, strict I/O. Discussed with CCM team.   # Severe hyperkalemia due to AKI and ACE inhibitor: Received medical treatment in ER and CRRT. Resolved, K 4.3   # Anion gap metabolic acidosis with renal failure, also on metformin at home (on hold here).  Improved with CRRT. Stable today, CO2 23   # Hypotension/shock possible septic: per CCM.  Bcx staph hemolyticus 2/2 bottles. Abx per primary service-on zyvox. Off pressors now.   # Chronic CHF: s/p UF with CRRT. Volume status stable. Gentle fluids today, euvolemic to slightly volume down on exam today  Subjective:   Patient seen and examined bedside in ICU. No acute events overnight. Off pressors. UOP charted ~0.4L. Denies any CP, sob, N/V, dysgeusia, swelling.   Objective:   BP (!) 132/55   Pulse 69   Temp 97.8 F (36.6 C) (Oral)   Resp 15   Ht 6' (1.829 m)   Wt (!) 149.3 kg   SpO2 93%   BMI 44.64 kg/m   Intake/Output Summary (Last 24 hours) at 10/16/2022 0754 Last data filed at 10/16/2022 4098 Gross per 24 hour  Intake 1388.84 ml  Output 450 ml  Net 938.84 ml   Weight change: 3.3 kg  Physical Exam: Gen: NAD CVS: RRR Resp: CTA B/L Abd: soft, nt/nd Ext: no sig edema b/l Les Neuro: awake, alert, no asterixis Dialysis access: RIJ temp HD cath c/d/i  Imaging: No  results found.  Labs: BMET Recent Labs  Lab 10/13/22 0405 10/13/22 1128 10/13/22 1138 10/13/22 1633 10/14/22 0346 10/14/22 1528 10/15/22 0428 10/15/22 1611 10/16/22 0353  NA 137   < > 139 140 139 138 136 135 134*  K 6.3*   < > 5.4* 5.5* 5.1 4.9 4.5 4.5 4.3  CL 112*  --  111 109 109 106 104 104 103  CO2 13*  --  14* 16* 20* 22 23 24 23   GLUCOSE 96  --  92 103* 126* 116* 103* 102* 94  BUN 165*  --  165* 159* 102* 53* 33* 32* 33*  CREATININE 4.92*  --  4.74* 4.49* 2.84* 1.82* 1.40* 1.91* 2.08*  CALCIUM 7.6*  --  8.0* 8.2* 7.6* 8.0* 7.8* 8.1* 8.4*  PHOS 7.4*  --   --  6.5* 4.3 2.4* 2.4* 2.4* 2.8   < > = values in this interval not displayed.   CBC Recent Labs  Lab 10/13/22 0103 10/13/22 1128 10/13/22 1138 10/14/22 0346  WBC 5.4  --   --  4.8  NEUTROABS 4.0  --   --   --   HGB 9.0* 8.2* 8.1* 7.7*  HCT 29.4* 24.0* 25.8* 23.9*  MCV 95.1  --   --  90.5  PLT 240  --   --  194    Medications:     atorvastatin  20 mg  Oral QHS   Chlorhexidine Gluconate Cloth  6 each Topical Q0600   cyanocobalamin  500 mcg Oral Daily   enoxaparin (LOVENOX) injection  30 mg Subcutaneous Q24H   feeding supplement (GLUCERNA SHAKE)  237 mL Oral TID BM   folic acid  1 mg Oral Daily   insulin aspart  0-15 Units Subcutaneous Q4H   multivitamin  1 tablet Oral QHS   mupirocin ointment  1 Application Nasal BID   mouth rinse  15 mL Mouth Rinse 4 times per day   pantoprazole (PROTONIX) IV  40 mg Intravenous Q24H   rOPINIRole  1 mg Oral QPM   sodium chloride flush  10-40 mL Intracatheter Q12H   sodium chloride flush  3 mL Intravenous Q12H   tamsulosin  0.4 mg Oral Daily      Anthony Sar, MD [facility] 10/16/2022, 7:54 AM

## 2022-10-16 NOTE — Progress Notes (Signed)
OT Cancellation Note  Patient Details Name: Brian Silva MRN: [id number] DOB: February 10, 1941   Cancelled Treatment:    Reason Eval/Treat Not Completed: OT screened, no needs identified, will sign off. Pt is a resident of SNF and dependent in ADLs and mobility at baseline. No acute OT needs.   Evern Bio 10/16/2022, 7:42 AM Berna Spare, OTR/L Acute Rehabilitation Services Office: [phone]

## 2022-10-16 NOTE — Progress Notes (Signed)
Pt transferred to 3E04 via bed. Report given to Lieutenant Diego RN. Vitals per flowsheet

## 2022-10-16 NOTE — Progress Notes (Addendum)
Pt finger stick blood glucose 69 @1544  pt given 8 ounces of soda per hypoglycemia protocol.Repeat blood sugar @ 1626 96.

## 2022-10-17 ENCOUNTER — Other Ambulatory Visit (HOSPITAL_COMMUNITY): Payer: Self-pay

## 2022-10-17 DIAGNOSIS — D649 Anemia, unspecified: Secondary | ICD-10-CM | POA: Diagnosis not present

## 2022-10-17 DIAGNOSIS — L8990 Pressure ulcer of unspecified site, unspecified stage: Secondary | ICD-10-CM | POA: Diagnosis present

## 2022-10-17 DIAGNOSIS — N400 Enlarged prostate without lower urinary tract symptoms: Secondary | ICD-10-CM | POA: Diagnosis not present

## 2022-10-17 DIAGNOSIS — N179 Acute kidney failure, unspecified: Secondary | ICD-10-CM | POA: Diagnosis not present

## 2022-10-17 DIAGNOSIS — I5032 Chronic diastolic (congestive) heart failure: Secondary | ICD-10-CM | POA: Diagnosis not present

## 2022-10-17 LAB — RENAL FUNCTION PANEL
Albumin: 2.7 g/dL — ABNORMAL LOW (ref 3.5–5.0)
Anion gap: 8 (ref 5–15)
BUN: 30 mg/dL — ABNORMAL HIGH (ref 8–23)
CO2: 23 mmol/L (ref 22–32)
Calcium: 8.2 mg/dL — ABNORMAL LOW (ref 8.9–10.3)
Chloride: 103 mmol/L (ref 98–111)
Creatinine, Ser: 1.95 mg/dL — ABNORMAL HIGH (ref 0.61–1.24)
GFR, Estimated: 34 mL/min — ABNORMAL LOW (ref >60)
Glucose, Bld: 94 mg/dL (ref 70–99)
Phosphorus: 3 mg/dL (ref 2.5–4.6)
Potassium: 4.3 mmol/L (ref 3.5–5.1)
Sodium: 134 mmol/L — ABNORMAL LOW (ref 135–145)

## 2022-10-17 LAB — GLUCOSE, CAPILLARY
Glucose-Capillary: 81 mg/dL (ref 70–99)
Glucose-Capillary: 83 mg/dL (ref 70–99)
Glucose-Capillary: 85 mg/dL (ref 70–99)
Glucose-Capillary: 86 mg/dL (ref 70–99)
Glucose-Capillary: 87 mg/dL (ref 70–99)
Glucose-Capillary: 90 mg/dL (ref 70–99)
Glucose-Capillary: 95 mg/dL (ref 70–99)

## 2022-10-17 LAB — CULTURE, BLOOD (ROUTINE X 2)
Culture: NO GROWTH
Culture: NO GROWTH
Special Requests: ADEQUATE

## 2022-10-17 LAB — MAGNESIUM: Magnesium: 1.8 mg/dL (ref 1.7–2.4)

## 2022-10-17 MED ORDER — MELATONIN 3 MG PO TABS
3.0000 mg | ORAL_TABLET | Freq: Once | ORAL | Status: AC
  Administered 2022-10-17: 3 mg via ORAL
  Filled 2022-10-17: qty 1

## 2022-10-17 MED ORDER — ALUM & MAG HYDROXIDE-SIMETH 200-200-20 MG/5ML PO SUSP: 30.0000 mL | ORAL | Status: DC | PRN

## 2022-10-17 MED ORDER — ENOXAPARIN SODIUM 40 MG/0.4ML IJ SOSY
40.0000 mg | PREFILLED_SYRINGE | INTRAMUSCULAR | Status: DC
  Administered 2022-10-18 – 2022-10-19 (×2): 40 mg via SUBCUTANEOUS
  Filled 2022-10-17 (×3): qty 0.4

## 2022-10-17 NOTE — Assessment & Plan Note (Addendum)
Glucose has been stable. Patient was treated with insulin sliding scale for glucose cover and monitoring.  Holding on metformin for now.   Continue with statin therapy.

## 2022-10-17 NOTE — Progress Notes (Signed)
[facility] Progress Note    Assessment/ Plan:   # Acute kidney injury on CKD IIIb (b/l cr 1.4-1.6): Likely ischemic ATN in the setting of shock (SBP 70-80) associated with the use of lisinopril, possible GI bleed etc.  CT scan without hydronephrosis.  UA bland. Started CRRT on 6/22 for uremic encephalopathy and AKI. Mental status improved and continues to be stable -Cr stable at 1.95 today, nonoliguric. Hopefully can d/c HD catheter in the next 24-48 hrs. No indication for RRT today-not exhibiting any uremic s/s or volume overload. Encouraged PO intake -Monitor daily labs, strict I/O   # Severe hyperkalemia due to AKI and ACE inhibitor: Received medical treatment in ER and CRRT. Resolved, K 4.3today   # Anion gap metabolic acidosis with renal failure, also on metformin at home (on hold here).  Improved with CRRT. Stable today, CO2 23   # Hypotension/shock: Bcx staph hemolyticus 2/2 bottles. Off Abx per primary service. Possibly acidosis related. VSS   # Chronic HFpEF: s/p UF with CRRT. Volume status stable. Euvolemic on exam today  Subjective:   Patient seen and examined bedside. Out of ICU now. He does report that he hasn't eaten yet but has an appetite. Denies any n/v, dysgeusia, chest pain, SOB Uop ~1.1L+1 unmeasured void, received lasix 60mg  IV yesterday AM.   Objective:   BP (!) 112/55 (BP Location: Left Wrist)   Pulse 70   Temp 97.8 F (36.6 C) (Oral)   Resp 18   Ht 6' (1.829 m)   Wt (!) 148.8 kg   SpO2 98%   BMI 44.49 kg/m   Intake/Output Summary (Last 24 hours) at 10/17/2022 0845 Last data filed at 10/17/2022 0500 Gross per 24 hour  Intake 1427.66 ml  Output 950 ml  Net 477.66 ml   Weight change: -0.5 kg  Physical Exam: Gen: NAD CVS: RRR Resp: CTA B/L Abd: soft, nt/nd Ext: no sig edema b/l Les Neuro: awake, alert, no asterixis Dialysis access: RIJ temp HD cath c/d/i  Imaging: No results found.  Labs: BMET Recent Labs  Lab  10/13/22 1633 10/14/22 0346 10/14/22 1528 10/15/22 0428 10/15/22 1611 10/16/22 0353 10/17/22 0057  NA 140 139 138 136 135 134* 134*  K 5.5* 5.1 4.9 4.5 4.5 4.3 4.3  CL 109 109 106 104 104 103 103  CO2 16* 20* 22 23 24 23 23   GLUCOSE 103* 126* 116* 103* 102* 94 94  BUN 159* 102* 53* 33* 32* 33* 30*  CREATININE 4.49* 2.84* 1.82* 1.40* 1.91* 2.08* 1.95*  CALCIUM 8.2* 7.6* 8.0* 7.8* 8.1* 8.4* 8.2*  PHOS 6.5* 4.3 2.4* 2.4* 2.4* 2.8 3.0   CBC Recent Labs  Lab 10/13/22 0103 10/13/22 1128 10/13/22 1138 10/14/22 0346  WBC 5.4  --   --  4.8  NEUTROABS 4.0  --   --   --   HGB 9.0* 8.2* 8.1* 7.7*  HCT 29.4* 24.0* 25.8* 23.9*  MCV 95.1  --   --  90.5  PLT 240  --   --  194    Medications:     atorvastatin  20 mg Oral QHS   Chlorhexidine Gluconate Cloth  6 each Topical Q0600   cyanocobalamin  500 mcg Oral Daily   enoxaparin (LOVENOX) injection  30 mg Subcutaneous Q24H   feeding supplement (GLUCERNA SHAKE)  237 mL Oral TID BM   folic acid  1 mg Oral Daily   insulin aspart  0-15 Units Subcutaneous Q4H   melatonin  3 mg Oral  Once   multivitamin  1 tablet Oral QHS   mupirocin ointment  1 Application Nasal BID   mouth rinse  15 mL Mouth Rinse 4 times per day   pantoprazole (PROTONIX) IV  40 mg Intravenous Q24H   rOPINIRole  1 mg Oral QPM   sodium chloride flush  10-40 mL Intracatheter Q12H   sodium chloride flush  3 mL Intravenous Q12H   tamsulosin  0.4 mg Oral Daily      Anthony Sar, MD [facility] 10/17/2022, 8:45 AM

## 2022-10-17 NOTE — Assessment & Plan Note (Addendum)
Echocardiogram with preserved LV systolic function with EF 60 to 65%, RV systolic function preserved, no LVH, trivial pericardial effusion. No significant valvular disease.   Patient with negative fluid balance at the time of his discharge.   Plan to continue blood pressure control with amlodipine. Hold on RAAS due to recovering ATN. Resume diuresis on 07/01, with close follow up renal function and electrolytes.

## 2022-10-17 NOTE — Assessment & Plan Note (Signed)
Cell count has been stable.  

## 2022-10-17 NOTE — Progress Notes (Signed)
SLP Cancellation Note  Patient Details Name: Brian Silva MRN: [id number] DOB: July 23, 1940   Cancelled treatment:       Reason Eval/Treat Not Completed: Patient declined, no reason specified  Pt reports he did not sleep well, declined to consume any po, denies issues with swallowing.  Did not have breakfast yet but reports he is not hungry. Will continue efforts.    Rolena Infante, MS [facility] SLP Acute Rehab Services Office [phone]    Chales Abrahams 10/17/2022, 8:08 AM

## 2022-10-17 NOTE — Assessment & Plan Note (Addendum)
CKD stage 3b, ischemic ATN.  Acute metabolic encephalopathy due to uremia now resolved.  Acute metabolic acidosis with shock now resolved.  Hyponatremia. Hyperkalemia.  Patient initially required renal replacement therapy with CRRT, which was initiated on 06/22 and stopped on 06/24.  Right internal jugular HD catheter now has been removed.   His renal function has recovered, and at the time of his discharge his fluid balance is negative 2,644 ml.  Patient has lost about 3 Kg since admission.   Renal function with serum cr at 1,51 with K at 4,3 and serum bicarbonate at 24. Na 138.  Plan to resume furosemide at 40 mg po daily starting on 07/01 and have close follow up of renal function.  Can increase to 40 mg po bid in case of volume overload, weight gain 2 to 3 lb in 24 hrs or 5 lbs in 7 days.  Stop metformin for now to prevent acidosis.

## 2022-10-17 NOTE — Hospital Course (Addendum)
Brian Silva was admitted to the hospital with the working diagnosis of AKI, requiring renal replacement therapy.   82 year old morbidly obese male with chronic hypoxic respiratory failure on 2 L oxygen, non ambulatory, hypertension, hyperlipidemia and CKD stage IIIa who was admitted last month for proctocolitis and septic shock. Back then he had a colonoscopy which showed multiple polyps which were removed, noted to be tubular adenoma, negative for high-grade dysplasia. He presented to the emergency department form the SNF with a complaint of increased sleepiness, tremors and hematochezia x1. In the ED he was found to be lethargic with generalized tremors. Blood pressure 87/43, HR 88, RR 14 and 02 saturation 98%. Lungs with decreased air movement with no wheezing, heart with S1 and S2 present and rhythmic, abdomen with no distention and positive lower extremity edema.   Na 139, K 6,8, Cl 108, bicarbonate 11, glucose 96, BUN 172, Cr 5,39, anion gap 20.  Lactic acid 0,7 Wbc 5.4 hgb 9,0 plt 240  Sars covid 19 negative.   Blood culture positive 2/2 staphylococcus epidermidis haemolyticus.   He was given IV fluid, calcium gluconate, sodium zirconium and was placed on bicarb drip.  Nephrology was consulted patient was found to be uremic with volume overload, nephrology decided to start CRRT to help with uremia, volume overload and hyperkalemia   Patient was admitted to the intensive care unit. He required vasopressors.   06/24 stopped CRRT 06/25 off vasopressors. 06/26 transferred to [facility].  06/27 renal function is recovering. No current need for renal replacement therapy.  06/28 HD catheter has been removed. Renal function at baseline.  Plan to return to SNF to continue physical therapy.

## 2022-10-17 NOTE — Assessment & Plan Note (Addendum)
Calculated BMI is 44.4   Patient is not ambulatory, he had spine injury after a fall.   Chronic hypoxemic respiratory failure, OSA.

## 2022-10-17 NOTE — Progress Notes (Addendum)
Progress Note   Patient: Brian Silva ZOX:[id number] DOB: 1940-06-11 DOA: 10/13/2022     4 DOS: the patient was seen and examined on 10/17/2022   Brief hospital course: [name] was admitted to the hospital with the working diagnosis of AKI, requiring renal replacement therapy.   82 year old morbidly obese male with chronic hypoxic respiratory failure on 2 L oxygen, hypertension, hyperlipidemia and CKD stage IIIa who was admitted last month for proctocolitis and septic shock, improved had a colonoscopy which showed multiple polyps which were removed, noted to be tubular adenoma, negative for high-grade dysplasia, who presented to the emergency department with a complaint of bloody bowel movement as per patient he was in usual state of health this morning, when he went to have regular BM, he noted blood on his stool so he came to the emergency department for evaluation in the ED he was found to be lethargic with generalized tremors, workup showed AKI on CKD3a, metabolic acidosis and hyperkalemia requiring treatment.  He was given IV fluid.  Nephrology was consulted patient was found to be uremic with volume overload, nephrology decided to start CRRT to help with uremia, volume overload and hyperkalemia   Patient was admitted to the intensive care unit. He required vasopressors.   06/24 stopped CRRT 06/25 off vasopressors  Assessment and Plan: * Acute kidney injury superimposed on chronic kidney disease (HCC) CKD stage 3b, ischemic ATN.  Acute metabolic encephalopathy due to uremia. Acute metabolic acidosis with shock.  Hyponatremia. Hyperkalemia.  Renal function today with serum cr at 1.95 with K at 4.3 and serum bicarbonate at 23.  Na 134.  Mg 1.8  Patient off CRRT Urine output 1,150 ml.  Plan to continue close monitoring renal recovery, avoid hypotension and nephrotoxic medications.  Encephalopathy has resolved.   Heart failure with preserved ejection fraction  (HCC) Echocardiogram with preserved LV systolic function with EF 60 to 65%, RV systolic function preserved, no LVH, trivial pericardial effusion. No significant valvular disease.   Systolic blood pressure 114 to 124 mmHg.  Plan to continue close monitoring blood pressure.   Diabetes mellitus type II, non insulin dependent (HCC) Glucose has been stable.  Continue insulin sliding scale for glucose cover and monitoring.   Normocytic anemia Cell count has been stable.   Class 3 obesity (HCC) Calculated BMI is 44.4   Patient is not ambulatory, he had spine injury after a fall.   Chronic hypoxemic respiratory failure, OSA.   BPH (benign prostatic hyperplasia) No urinary retention, continue with flomax.         Subjective: Patient is feeling better, no chest pain or dyspnea, he has been not ambulatory for at least 6 months   Physical Exam: Vitals:   10/17/22 0026 10/17/22 0352 10/17/22 0926 10/17/22 1202  BP: (!) 113/33 (!) 112/55 (!) 124/41 (!) 114/40  Pulse: 73 70 65 67  Resp: 18 18 16 20   Temp: 97.8 F (36.6 C) 97.8 F (36.6 C) 98.3 F (36.8 C) 98 F (36.7 C)  TempSrc: Oral Oral Oral Oral  SpO2: 100% 98% 100% 100%  Weight:  (!) 148.8 kg    Height:       Neurology awake and alert ENT with mild pallor Cardiovascular with S1 and S2 present and rhythmic with no gallops, rubs or murmurs Respiratory with no rales or wheezing Abdomen with no distention  Non pitting lower extremity edema  Data Reviewed:    Family Communication: no family at the bedside   Disposition: Status is: Inpatient  Remains inpatient appropriate because: recovering renal failure   Planned Discharge Destination: Home    Author: Coralie Keens, MD 10/17/2022 1:31 PM  For on call review [email or web address].

## 2022-10-17 NOTE — Assessment & Plan Note (Signed)
No urinary retention, continue with flomax.

## 2022-10-17 NOTE — Care Management Important Message (Signed)
Important Message  Patient Details  Name: Brian Silva MRN: [id number] Date of Birth: 07-Jun-1940   Medicare Important Message Given:  Yes     Renie Ora 10/17/2022, 8:01 AM

## 2022-10-18 DIAGNOSIS — N179 Acute kidney failure, unspecified: Secondary | ICD-10-CM | POA: Diagnosis not present

## 2022-10-18 DIAGNOSIS — D649 Anemia, unspecified: Secondary | ICD-10-CM | POA: Diagnosis not present

## 2022-10-18 DIAGNOSIS — I5032 Chronic diastolic (congestive) heart failure: Secondary | ICD-10-CM | POA: Diagnosis not present

## 2022-10-18 LAB — RENAL FUNCTION PANEL
Albumin: 2.6 g/dL — ABNORMAL LOW (ref 3.5–5.0)
Anion gap: 8 (ref 5–15)
BUN: 27 mg/dL — ABNORMAL HIGH (ref 8–23)
CO2: 22 mmol/L (ref 22–32)
Calcium: 8.3 mg/dL — ABNORMAL LOW (ref 8.9–10.3)
Chloride: 108 mmol/L (ref 98–111)
Creatinine, Ser: 1.73 mg/dL — ABNORMAL HIGH (ref 0.61–1.24)
GFR, Estimated: 40 mL/min — ABNORMAL LOW (ref >60)
Glucose, Bld: 92 mg/dL (ref 70–99)
Phosphorus: 3.3 mg/dL (ref 2.5–4.6)
Potassium: 4.4 mmol/L (ref 3.5–5.1)
Sodium: 138 mmol/L (ref 135–145)

## 2022-10-18 LAB — GLUCOSE, CAPILLARY
Glucose-Capillary: 75 mg/dL (ref 70–99)
Glucose-Capillary: 83 mg/dL (ref 70–99)
Glucose-Capillary: 82 mg/dL (ref 70–99)
Glucose-Capillary: 76 mg/dL (ref 70–99)

## 2022-10-18 LAB — MAGNESIUM: Magnesium: 1.8 mg/dL (ref 1.7–2.4)

## 2022-10-18 LAB — CULTURE, BLOOD (ROUTINE X 2)

## 2022-10-18 NOTE — Assessment & Plan Note (Addendum)
Will resume amlodipine at the time of his discharge. Continue to hold on lisinopril due to recovering from ATN.

## 2022-10-18 NOTE — Progress Notes (Addendum)
Progress Note   Patient: Brian Silva YQI:[id number] DOB: 09/20/1940 DOA: 10/13/2022     5 DOS: the patient was seen and examined on 10/18/2022   Brief hospital course: Brian Silva was admitted to the hospital with the working diagnosis of AKI, requiring renal replacement therapy.   82 year old morbidly obese male with chronic hypoxic respiratory failure on 2 L oxygen, non ambulatory, hypertension, hyperlipidemia and CKD stage IIIa who was admitted last month for proctocolitis and septic shock, improved had a colonoscopy which showed multiple polyps which were removed, noted to be tubular adenoma, negative for high-grade dysplasia, who presented to the emergency department with a complaint of bloody bowel movement as per patient he was in usual state of health this morning, when he went to have regular BM, he noted blood on his stool so he came to the emergency department for evaluation in the ED he was found to be lethargic with generalized tremors. Blood pressure 87/43, HR 88, RR 14 and 02 saturation 98%. Lungs with decreased air movement with no wheezing, heart with S1 and S2 present and rhythmic, abdomen with no distention and positive lower extremity edema.   Na 139, K 6,8, Cl 108, bicarbonate 11, glucose 96, BUN 172, Cr 5,39, anion gap 20.  Lactic acid 0,7 Wbc 5.4 hgb 9,0 plt 240  Sars covid 19 negative.   Blood culture positive 2/2 staphylococcus epidermidis haemolyticus.   He was given IV fluid.  Nephrology was consulted patient was found to be uremic with volume overload, nephrology decided to start CRRT to help with uremia, volume overload and hyperkalemia   Patient was admitted to the intensive care unit. He required vasopressors.   06/24 stopped CRRT 06/25 off vasopressors. 06/26 transferred to [facility].  06/27 renal function is recovering. No current need for renal replacement therapy.   Assessment and Plan: * Acute kidney injury superimposed on chronic kidney disease  (HCC) CKD stage 3b, ischemic ATN.  Acute metabolic encephalopathy due to uremia. Acute metabolic acidosis with shock.  Hyponatremia. Hyperkalemia.  He has been off CRRT.  Right internal jugular HD catheter in place.   No signs of volume overload.  Urine output is documented at 975 ml Renal function with serum cr at 1,73 with K at 4,4 and serum bicarbonate at 27. Na 138, Mg 1,8   Plan to continue close monitoring renal recovery, avoid hypotension and nephrotoxic medications.  Encephalopathy has resolved.   Acute on chronic diastolic heart failure (HCC) Echocardiogram with preserved LV systolic function with EF 60 to 65%, RV systolic function preserved, no LVH, trivial pericardial effusion. No significant valvular disease.   Systolic blood pressure 139 to 161 mmHg.  Plan to continue close monitoring blood pressure.   Type 2 diabetes mellitus with hyperlipidemia (HCC) Glucose has been stable, fasting glucose 92  Continue insulin sliding scale for glucose cover and monitoring.  Holding on metformin for now.   Continue with statin therapy.   Essential hypertension At home patient on amlodipine, lisinopril and furosemide.   Normocytic anemia Cell count has been stable.   Class 3 obesity (HCC) Calculated BMI is 44.4   Patient is not ambulatory, he had spine injury after a fall.   Chronic hypoxemic respiratory failure, OSA.   Pressure injury of skin {Tip this will not be part of the note when signed Body mass index is 44.49 kg/m. ,  Nutrition Documentation    Flowsheet Row ED to Hosp-Admission (Current) from 10/13/2022 in [facility] 3E HF PCU  Nutrition  Problem Inadequate oral intake  Etiology poor appetite  Nutrition Goal Patient will meet greater than or equal to 90% of their needs  Interventions Glucerna shake, MVI      Active Pressure Injury/Wound(s)     Pressure Ulcer  Duration          Pressure Injury 10/13/22 Buttocks Bilateral Stage 1 -  Intact  skin with non-blanchable redness of a localized area usually over a bony prominence. 4 days         BPH (benign prostatic hyperplasia) No urinary retention, continue with flomax.         Subjective: Patient is feeling better, no dyspnea or chest pain, continue very weak and deconditioned,   Physical Exam: Vitals:   10/17/22 2008 10/17/22 2355 10/18/22 0400 10/18/22 0853  BP: (!) 138/47 (!) 143/53 (!) 161/51 (!) 139/43  Pulse: 71 71 63 72  Resp: 19 (!) 21 17 19   Temp: 98.9 F (37.2 C) 98.7 F (37.1 C) 98.2 F (36.8 C) 98.2 F (36.8 C)  TempSrc: Oral Oral  Oral  SpO2: 100% 100% 100% 95%  Weight:   (!) 148.8 kg   Height:       Neurology awake and alert ENT With mild pallor Cardiovascular with S1 and S2 present and rhythmic with no gallops, rubs or  murmurs Respiratory with no rales or wheezing Abdomen with no distention  Trace non pitting lower extremity edema  Data Reviewed:    Family Communication: no family at the bedside   Disposition: Status is: Inpatient Remains inpatient appropriate because: pending further improvement in renal function   Planned Discharge Destination: Skilled nursing facility     Author: Coralie Keens, MD 10/18/2022 1:09 PM  For on call review [email or web address].

## 2022-10-18 NOTE — Consult Note (Signed)
   [facility] Inpatient Consult   10/18/2022  DELFORD WINGERT [birth year] [id number]  [facility] [[facility]]  Accountable Care Organization [ACO] Patient: Medicare ACO REACH  Primary Care Provider:  Merrilyn Puma, DO is a provider at [facility]   Patient was reviewed for high risk score with a 5 day length of stay barriers to care to return to SNF and post hospital follow up needs.  Patient is a long term resident at [facility]   Plan:   Needs for transition is to be met with a  SNF level of care, currently to return to [facility] per inpatient [facility] team notes where he is a resident prior to admission.  Will sign off at transition.  For questions or referrals, please contact:   Charlesetta Shanks, RN BSN CCM [facility]  [phone] business mobile phone Toll free office [phone]  *Concierge Line  [phone] Fax number: [phone] [email or web address]

## 2022-10-18 NOTE — Assessment & Plan Note (Signed)
{  Tip this will not be part of the note when signed Body mass index is 44.49 kg/m. ,  Nutrition Documentation    Flowsheet Row ED to Hosp-Admission (Current) from 10/13/2022 in [facility] 3E HF PCU  Nutrition Problem Inadequate oral intake  Etiology poor appetite  Nutrition Goal Patient will meet greater than or equal to 90% of their needs  Interventions Glucerna shake, MVI      Active Pressure Injury/Wound(s)     Pressure Ulcer  Duration          Pressure Injury 10/13/22 Buttocks Bilateral Stage 1 -  Intact skin with non-blanchable redness of a localized area usually over a bony prominence. 4 days

## 2022-10-18 NOTE — Progress Notes (Signed)
[facility] Progress Note    Assessment/ Plan:   # Acute kidney injury on CKD IIIb (b/l cr 1.4-1.6): Likely ischemic ATN in the setting of shock (SBP 70-80) associated with the use of lisinopril, possible GI bleed etc.  CT scan without hydronephrosis.  UA bland. Started CRRT on 6/22 for uremic encephalopathy and AKI. CRRT stopped on 6/24. Mental status improved and continues to be stable -Cr down to 1.73 today (close to his baseline), nonoliguric. No indication for RRT today-not exhibiting any uremic s/s or volume overload. Encouraged PO intake. Will plan for HD catheter removal today -Monitor daily labs, strict I/O   # Severe hyperkalemia due to AKI and ACE inhibitor: Received medical treatment in ER and CRRT. Resolved, K 4.4today   # Anion gap metabolic acidosis with renal failure, also on metformin at home (on hold here).  Improved with CRRT. Stable today, CO2 22   # Hypotension/shock: Bcx staph hemolyticus 2/2 bottles. Off Abx per primary service. Possibly acidosis related. VSS   # Chronic HFpEF: s/p UF with CRRT. Volume status stable. Euvolemic on exam today  Subjective:   Patient seen and examined bedside. No complaints/acute events. Uop: ~0.9L with one unmeasured void overnight   Objective:   BP (!) 161/51 (BP Location: Left Wrist)   Pulse 63   Temp 98.2 F (36.8 C)   Resp 17   Ht 6' (1.829 m)   Wt (!) 148.8 kg   SpO2 100%   BMI 44.49 kg/m   Intake/Output Summary (Last 24 hours) at 10/18/2022 4098 Last data filed at 10/18/2022 0630 Gross per 24 hour  Intake --  Output 975 ml  Net -975 ml   Weight change: 0 kg  Physical Exam: Gen: NAD CVS: RRR Resp: CTA B/L Abd: soft, nt/nd Ext: no sig edema b/l Les Neuro: awake, alert, no asterixis Dialysis access: RIJ temp HD cath c/d/i  Imaging: No results found.  Labs: BMET Recent Labs  Lab 10/14/22 0346 10/14/22 1528 10/15/22 0428 10/15/22 1611 10/16/22 0353 10/17/22 0057 10/18/22 0104  NA  139 138 136 135 134* 134* 138  K 5.1 4.9 4.5 4.5 4.3 4.3 4.4  CL 109 106 104 104 103 103 108  CO2 20* 22 23 24 23 23 22   GLUCOSE 126* 116* 103* 102* 94 94 92  BUN 102* 53* 33* 32* 33* 30* 27*  CREATININE 2.84* 1.82* 1.40* 1.91* 2.08* 1.95* 1.73*  CALCIUM 7.6* 8.0* 7.8* 8.1* 8.4* 8.2* 8.3*  PHOS 4.3 2.4* 2.4* 2.4* 2.8 3.0 3.3   CBC Recent Labs  Lab 10/13/22 0103 10/13/22 1128 10/13/22 1138 10/14/22 0346  WBC 5.4  --   --  4.8  NEUTROABS 4.0  --   --   --   HGB 9.0* 8.2* 8.1* 7.7*  HCT 29.4* 24.0* 25.8* 23.9*  MCV 95.1  --   --  90.5  PLT 240  --   --  194    Medications:     atorvastatin  20 mg Oral QHS   Chlorhexidine Gluconate Cloth  6 each Topical Q0600   cyanocobalamin  500 mcg Oral Daily   enoxaparin (LOVENOX) injection  40 mg Subcutaneous Q24H   feeding supplement (GLUCERNA SHAKE)  237 mL Oral TID BM   folic acid  1 mg Oral Daily   insulin aspart  0-15 Units Subcutaneous Q4H   multivitamin  1 tablet Oral QHS   mupirocin ointment  1 Application Nasal BID   mouth rinse  15 mL Mouth Rinse 4  times per day   rOPINIRole  1 mg Oral QPM   sodium chloride flush  10-40 mL Intracatheter Q12H   sodium chloride flush  3 mL Intravenous Q12H   tamsulosin  0.4 mg Oral Daily      Anthony Sar, MD [facility] 10/18/2022, 8:23 AM

## 2022-10-18 NOTE — Progress Notes (Signed)
SLP Cancellation Note  Patient Details Name: Brian Silva MRN: [id number] DOB: 09-04-1940   Cancelled treatment:        Attempted to see pt for diet tolerance.  Per RN pt is tolerating diet without difficulty.  No recent CXR and imaging from 6/22 with clear lungs.  Unfortunately, pt is unavailable for PO trials at this time as he must lie flat following catheter removal.  SLP will reattempt as schedule permits.    Kerrie Pleasure, MA, CCC-SLP Acute Rehabilitation Services Office: [phone] 10/18/2022, 12:49 PM

## 2022-10-18 NOTE — Progress Notes (Signed)
Patient refused CPAP for the night  

## 2022-10-19 DIAGNOSIS — E1169 Type 2 diabetes mellitus with other specified complication: Secondary | ICD-10-CM | POA: Diagnosis not present

## 2022-10-19 DIAGNOSIS — N179 Acute kidney failure, unspecified: Secondary | ICD-10-CM | POA: Diagnosis not present

## 2022-10-19 DIAGNOSIS — I5033 Acute on chronic diastolic (congestive) heart failure: Secondary | ICD-10-CM | POA: Diagnosis not present

## 2022-10-19 DIAGNOSIS — I10 Essential (primary) hypertension: Secondary | ICD-10-CM

## 2022-10-19 DIAGNOSIS — E785 Hyperlipidemia, unspecified: Secondary | ICD-10-CM

## 2022-10-19 LAB — RENAL FUNCTION PANEL
Albumin: 2.7 g/dL — ABNORMAL LOW (ref 3.5–5.0)
Anion gap: 7 (ref 5–15)
BUN: 19 mg/dL (ref 8–23)
CO2: 24 mmol/L (ref 22–32)
Calcium: 8.5 mg/dL — ABNORMAL LOW (ref 8.9–10.3)
Chloride: 107 mmol/L (ref 98–111)
Creatinine, Ser: 1.51 mg/dL — ABNORMAL HIGH (ref 0.61–1.24)
GFR, Estimated: 47 mL/min — ABNORMAL LOW (ref >60)
Glucose, Bld: 80 mg/dL (ref 70–99)
Phosphorus: 2.5 mg/dL (ref 2.5–4.6)
Potassium: 4.3 mmol/L (ref 3.5–5.1)
Sodium: 138 mmol/L (ref 135–145)

## 2022-10-19 LAB — GLUCOSE, CAPILLARY
Glucose-Capillary: 67 mg/dL — ABNORMAL LOW (ref 70–99)
Glucose-Capillary: 71 mg/dL (ref 70–99)
Glucose-Capillary: 73 mg/dL (ref 70–99)
Glucose-Capillary: 77 mg/dL (ref 70–99)
Glucose-Capillary: 77 mg/dL (ref 70–99)

## 2022-10-19 LAB — CULTURE, BLOOD (ROUTINE X 2): Culture: NO GROWTH

## 2022-10-19 NOTE — Progress Notes (Signed)
Progress Note   Patient: Brian Silva ZOX:[id number] DOB: 23-Feb-1941 DOA: 10/13/2022     6 DOS: the patient was seen and examined on 10/19/2022   Brief hospital course: Mr. Lanctot was admitted to the hospital with the working diagnosis of AKI, requiring renal replacement therapy.   82 year old morbidly obese male with chronic hypoxic respiratory failure on 2 L oxygen, non ambulatory, hypertension, hyperlipidemia and CKD stage IIIa who was admitted last month for proctocolitis and septic shock, improved had a colonoscopy which showed multiple polyps which were removed, noted to be tubular adenoma, negative for high-grade dysplasia, who presented to the emergency department with a complaint of bloody bowel movement as per patient he was in usual state of health this morning, when he went to have regular BM, he noted blood on his stool so he came to the emergency department for evaluation in the ED he was found to be lethargic with generalized tremors. Blood pressure 87/43, HR 88, RR 14 and 02 saturation 98%. Lungs with decreased air movement with no wheezing, heart with S1 and S2 present and rhythmic, abdomen with no distention and positive lower extremity edema.   Na 139, K 6,8, Cl 108, bicarbonate 11, glucose 96, BUN 172, Cr 5,39, anion gap 20.  Lactic acid 0,7 Wbc 5.4 hgb 9,0 plt 240  Sars covid 19 negative.   Blood culture positive 2/2 staphylococcus epidermidis haemolyticus.   He was given IV fluid.  Nephrology was consulted patient was found to be uremic with volume overload, nephrology decided to start CRRT to help with uremia, volume overload and hyperkalemia   Patient was admitted to the intensive care unit. He required vasopressors.   06/24 stopped CRRT 06/25 off vasopressors. 06/26 transferred to [facility].  06/27 renal function is recovering. No current need for renal replacement therapy.  06/28 HD catheter has been removed. Renal function at baseline.  Plan to return to SNF to  continue physical therapy.   Assessment and Plan: * Acute kidney injury superimposed on chronic kidney disease (HCC) CKD stage 3b, ischemic ATN.  Acute metabolic encephalopathy due to uremia now resolved.  Acute metabolic acidosis with shock now resolved.  Hyponatremia. Hyperkalemia.  He has been off CRRT.  Right internal jugular HD catheter has been removed.   No signs of volume overload.  Urine output is documented at 500 ml  Renal function with serum cr at 1,51 with K at 4,3 and serum bicarbonate at 24. Na 138. Renal function back to baseline, follow up renal panel in 48 hrs.   Acute on chronic diastolic heart failure (HCC) Echocardiogram with preserved LV systolic function with EF 60 to 65%, RV systolic function preserved, no LVH, trivial pericardial effusion. No significant valvular disease.   Blood pressure has been stable.  Limited pharmacologic therapy due to recovering renal failure.   Type 2 diabetes mellitus with hyperlipidemia (HCC) Glucose has been stable, fasting glucose 80 Continue insulin sliding scale for glucose cover and monitoring.  Holding on metformin for now.   Continue with statin therapy.   Essential hypertension At home patient on amlodipine, lisinopril and furosemide, currently on hold.   Normocytic anemia Cell count has been stable.   Class 3 obesity (HCC) Calculated BMI is 44.4   Patient is not ambulatory, he had spine injury after a fall.   Chronic hypoxemic respiratory failure, OSA.   Pressure injury of skin {Tip this will not be part of the note when signed Body mass index is 44.49 kg/m. ,  Nutrition  Documentation    Flowsheet Row ED to Hosp-Admission (Current) from 10/13/2022 in [facility] 3E HF PCU  Nutrition Problem Inadequate oral intake  Etiology poor appetite  Nutrition Goal Patient will meet greater than or equal to 90% of their needs  Interventions Glucerna shake, MVI      Active Pressure Injury/Wound(s)      Pressure Ulcer  Duration          Pressure Injury 10/13/22 Buttocks Bilateral Stage 1 -  Intact skin with non-blanchable redness of a localized area usually over a bony prominence. 4 days         BPH (benign prostatic hyperplasia) No urinary retention, continue with flomax.         Subjective: Patient very weak and deconditioned, no chest pain or dyspnea.   Physical Exam: Vitals:   10/19/22 0429 10/19/22 0759 10/19/22 0800 10/19/22 1200  BP: (!) 157/51  (!) 156/54 (!) 168/54  Pulse: 64  65 62  Resp: (!) 22  18 16   Temp: 98.4 F (36.9 C) 98 F (36.7 C) 98.1 F (36.7 C) 98.2 F (36.8 C)  TempSrc: Oral Oral Oral Oral  SpO2: 99%  100% 94%  Weight: (!) 148.8 kg     Height:       Neurology awake and alert ENT with mild pallor Cardiovascular with S1 and S2 present and rhythmic with no gallops, rubs or murmurs No JVD Trace non pitting lower extremity edema Respiratory with no rales or wheezing, no rhonchi Abdomen with no distention   Data Reviewed:    Family Communication: no family at the bedside   Disposition: Status is: Inpatient Remains inpatient appropriate because: pending transfer to SNF  Planned Discharge Destination: Skilled nursing facility     Author: Coralie Keens, MD 10/19/2022 4:07 PM  For on call review [email or web address].

## 2022-10-19 NOTE — Progress Notes (Signed)
CSW spoke with [place] from [facility] SNF([phone]) who confirmed pt is LTC. Pt can return when medically stable.   MD informed CSW that the plan will be for pt to dc back to [facility] tomorrow.

## 2022-10-19 NOTE — Progress Notes (Signed)
   10/19/22 2200  BiPAP/CPAP/SIPAP  Reason BIPAP/CPAP not in use Non-compliant

## 2022-10-19 NOTE — Progress Notes (Signed)
Speech Language Pathology Treatment: Dysphagia  Patient Details Name: Brian Silva MRN: [id number] DOB: 1941-01-08 Today's Date: 10/19/2022 Time: 4098-1191 SLP Time Calculation (min) (ACUTE ONLY): 16 min  Assessment / Plan / Recommendation Clinical Impression  Pt seen for dysphagia tx with intake of regular/thin liquids via straw without observation of delayed throat clearing per prior ST note nor other overt s/sx of aspiration throughout entire consumption of peaches/thin liquids.  Pt refuses certain foods as he is actively participating in intermittent fasting.  Pt masticated food adequately, exhibited a timely swallow and no oral/pharyngeal residue observed/suspected.  Pt has met all ST goals and was able to recall general swallowing precautions.  Respiratory/swallowing reciprocity discussed/education provided to pt.  Continue regular/thin liquids with pt preferred foods to increase satiety.  ST will s/o at this time in acute setting.    HPI HPI: Brian Silva is a 82 y.o. male with PMH reviewed presents to the ED for evaluation of weakness, AMS, and blood in the BMs today noticed by staff at his SNF. Patient was discharged in early May after hospitalization where he was encephalopathic, developed proctocolitis and associated septic shock and hypoxic/hypercarbic respiratory failure.      SLP Plan  Discharge SLP treatment due to goals met.      Recommendations for follow up therapy are one component of a multi-disciplinary discharge planning process, led by the attending physician.  Recommendations may be updated based on patient status, additional functional criteria and insurance authorization.    Recommendations  Diet recommendations: Regular;Thin liquid Liquids provided via: Cup;Straw Medication Administration: Whole meds with liquid Supervision: Patient able to self feed Compensations: Minimize environmental distractions Postural Changes and/or Swallow Maneuvers: Seated  upright 90 degrees                  Oral care BID   Other (comment) (Assistance recommended at d/c) Dysphagia, unspecified (R13.10)     Discharge SLP treatment due to (comment)     Pat Sergey Ishler,M.S., CCC-SLP  10/19/2022, 11:16 AM

## 2022-10-19 NOTE — Progress Notes (Signed)
[facility] Progress Note    Assessment/ Plan:   # Acute kidney injury on CKD IIIb (b/l cr 1.4-1.6): Likely ischemic ATN in the setting of shock (SBP 70-80) associated with the use of lisinopril, possible GI bleed etc.  CT scan without hydronephrosis.  UA bland. Started CRRT on 6/22 for uremic encephalopathy and AKI. CRRT stopped on 6/24. Mental status improved and continues to be stable -Cr down to 1.5 today (around his baseline), nonoliguric. HD line is out. No indication for RRT -Monitor daily labs, strict I/O   # Severe hyperkalemia due to AKI and ACE inhibitor: Received medical treatment in ER and CRRT. Resolved, K 4.4today   # Anion gap metabolic acidosis with renal failure, also on metformin at home (on hold here).  Improved with CRRT. Stable today, CO2 22   # Hypotension/shock: Bcx staph hemolyticus 2/2 bottles. Off Abx per primary service. Possibly acidosis related. VSS   # Chronic HFpEF: s/p UF with CRRT. Volume status stable. Euvolemic on exam today  Nothing else to add from an inpatient nephrology perspective. Will sign off. Please call with any questions/concerns. Discussed with primary service  Subjective:   Patient seen and examined bedside. No complaints/acute events. HD cath removed.   Objective:   BP (!) 157/51 (BP Location: Left Arm)   Pulse 64   Temp 98 F (36.7 C) (Oral)   Resp (!) 22   Ht 6' (1.829 m)   Wt (!) 148.8 kg   SpO2 99%   BMI 44.49 kg/m   Intake/Output Summary (Last 24 hours) at 10/19/2022 0915 Last data filed at 10/19/2022 0431 Gross per 24 hour  Intake 0 ml  Output 500 ml  Net -500 ml   Weight change: 0 kg  Physical Exam: Gen: NAD CVS: RRR Resp: CTA B/L Abd: soft, nt/nd Ext: no sig edema b/l Les Neuro: awake, alert, no myoclonic jerking observed Dialysis access: none  Imaging: No results found.  Labs: BMET Recent Labs  Lab 10/14/22 1528 10/15/22 0428 10/15/22 1611 10/16/22 0353 10/17/22 0057  10/18/22 0104 10/19/22 0035  NA 138 136 135 134* 134* 138 138  K 4.9 4.5 4.5 4.3 4.3 4.4 4.3  CL 106 104 104 103 103 108 107  CO2 22 23 24 23 23 22 24   GLUCOSE 116* 103* 102* 94 94 92 80  BUN 53* 33* 32* 33* 30* 27* 19  CREATININE 1.82* 1.40* 1.91* 2.08* 1.95* 1.73* 1.51*  CALCIUM 8.0* 7.8* 8.1* 8.4* 8.2* 8.3* 8.5*  PHOS 2.4* 2.4* 2.4* 2.8 3.0 3.3 2.5   CBC Recent Labs  Lab 10/13/22 0103 10/13/22 1128 10/13/22 1138 10/14/22 0346  WBC 5.4  --   --  4.8  NEUTROABS 4.0  --   --   --   HGB 9.0* 8.2* 8.1* 7.7*  HCT 29.4* 24.0* 25.8* 23.9*  MCV 95.1  --   --  90.5  PLT 240  --   --  194    Medications:     atorvastatin  20 mg Oral QHS   Chlorhexidine Gluconate Cloth  6 each Topical Q0600   cyanocobalamin  500 mcg Oral Daily   enoxaparin (LOVENOX) injection  40 mg Subcutaneous Q24H   feeding supplement (GLUCERNA SHAKE)  237 mL Oral TID BM   folic acid  1 mg Oral Daily   insulin aspart  0-15 Units Subcutaneous Q4H   multivitamin  1 tablet Oral QHS   mouth rinse  15 mL Mouth Rinse 4 times per day  rOPINIRole  1 mg Oral QPM   sodium chloride flush  10-40 mL Intracatheter Q12H   sodium chloride flush  3 mL Intravenous Q12H   tamsulosin  0.4 mg Oral Daily      Anthony Sar, MD [facility] 10/19/2022, 9:15 AM

## 2022-10-20 DIAGNOSIS — N179 Acute kidney failure, unspecified: Secondary | ICD-10-CM | POA: Diagnosis not present

## 2022-10-20 DIAGNOSIS — I5033 Acute on chronic diastolic (congestive) heart failure: Secondary | ICD-10-CM | POA: Diagnosis not present

## 2022-10-20 DIAGNOSIS — E1169 Type 2 diabetes mellitus with other specified complication: Secondary | ICD-10-CM | POA: Diagnosis not present

## 2022-10-20 DIAGNOSIS — I10 Essential (primary) hypertension: Secondary | ICD-10-CM | POA: Diagnosis not present

## 2022-10-20 LAB — GLUCOSE, CAPILLARY
Glucose-Capillary: 68 mg/dL — ABNORMAL LOW (ref 70–99)
Glucose-Capillary: 71 mg/dL (ref 70–99)
Glucose-Capillary: 80 mg/dL (ref 70–99)
Glucose-Capillary: 81 mg/dL (ref 70–99)

## 2022-10-20 MED ORDER — TRAMADOL HCL 50 MG PO TABS: 50.0000 mg | ORAL_TABLET | Freq: Two times a day (BID) | ORAL | 0 refills | Status: AC | PRN

## 2022-10-20 MED ORDER — FUROSEMIDE 40 MG PO TABS: 40.0000 mg | ORAL_TABLET | Freq: Every day | ORAL | 0 refills | Status: AC

## 2022-10-20 MED ORDER — TRAMADOL HCL 50 MG PO TABS: 50.0000 mg | ORAL_TABLET | Freq: Two times a day (BID) | ORAL | 0 refills | Status: DC | PRN

## 2022-10-20 MED ORDER — GLUCERNA SHAKE PO LIQD: 237.0000 mL | Freq: Three times a day (TID) | ORAL | 0 refills | Status: AC

## 2022-10-20 NOTE — Progress Notes (Signed)
OT Cancellation Note  Patient Details Name: Brian Silva MRN: [id number] DOB: 28-Sep-1940   Cancelled Treatment:    Reason Eval/Treat Not Completed: OT screened, no needs identified, will sign off (LT resident at [facility]. No acute OT needs. OT deferred to SNF as needed.)  [facility] 10/20/2022, 12:02 PM Luisa Dago, OT/L   Acute OT Clinical Specialist Acute Rehabilitation Services Pager [phone] Office [phone]

## 2022-10-20 NOTE — Progress Notes (Signed)
PT Cancellation Note  Patient Details Name: Brian Silva MRN: [id number] DOB: Aug 25, 1940   Cancelled Treatment:    Reason Eval/Treat Not Completed: PT screened, no needs identified, will sign off. Pt screened on 6/24. Pt is long term resident at nursing facility. Dependent for all mobility (hoyer lift) and ADL's. No PT needed.   Angelina Ok [facility] 10/20/2022, 8:58 AM Skip Mayer PT Acute Rehabilitation Services Office [phone]

## 2022-10-20 NOTE — Discharge Summary (Addendum)
Physician Discharge Summary   Patient: Brian Silva MRN: [id number] DOB: April 13, 1941  Admit date:     10/13/2022  Discharge date: 10/20/22  Discharge Physician: York Ram Dellene Mcgroarty   PCP: Merrilyn Puma, DO   Recommendations at discharge:    Patient will resume furosemide 40 mg po daily on 10/22/22. Holding lisinopril and metformin for now due to recovering from hyperkalemia, ATN and acidosis. Follow up renal function and electrolytes within 7 days.  Follow up with Dr Cristy Folks in 7 to 10 days.  Holding pregabalin, patient already on gabapentin.   Discharge Diagnoses: Principal Problem:   Acute kidney injury superimposed on chronic kidney disease (HCC) Active Problems:   Acute on chronic diastolic heart failure (HCC)   Type 2 diabetes mellitus with hyperlipidemia (HCC)   Essential hypertension   Normocytic anemia   Class 3 obesity (HCC)   Pressure injury of skin   BPH (benign prostatic hyperplasia)  Resolved Problems:   * No resolved hospital problems. [facility] Course: Brian Silva was admitted to the hospital with the working diagnosis of AKI, requiring renal replacement therapy.   82 year old morbidly obese male with chronic hypoxic respiratory failure on 2 L oxygen, non ambulatory, hypertension, hyperlipidemia and CKD stage IIIa who was admitted last month for proctocolitis and septic shock. Back then he had a colonoscopy which showed multiple polyps which were removed, noted to be tubular adenoma, negative for high-grade dysplasia. He presented to the emergency department form the SNF with a complaint of increased sleepiness, tremors and hematochezia x1. In the ED he was found to be lethargic with generalized tremors. Blood pressure 87/43, HR 88, RR 14 and 02 saturation 98%. Lungs with decreased air movement with no wheezing, heart with S1 and S2 present and rhythmic, abdomen with no distention and positive lower extremity edema.   Na 139, K 6,8, Cl 108,  bicarbonate 11, glucose 96, BUN 172, Cr 5,39, anion gap 20.  Lactic acid 0,7 Wbc 5.4 hgb 9,0 plt 240  Sars covid 19 negative.   Blood culture positive 2/2 staphylococcus epidermidis haemolyticus.   He was given IV fluid, calcium gluconate, sodium zirconium and was placed on bicarb drip.  Nephrology was consulted patient was found to be uremic with volume overload, nephrology decided to start CRRT to help with uremia, volume overload and hyperkalemia   Patient was admitted to the intensive care unit. He required vasopressors.   06/24 stopped CRRT 06/25 off vasopressors. 06/26 transferred to [facility].  06/27 renal function is recovering. No current need for renal replacement therapy.  06/28 HD catheter has been removed. Renal function at baseline.  Plan to return to SNF to continue physical therapy.   Assessment and Plan: * Acute kidney injury superimposed on chronic kidney disease (HCC) CKD stage 3b, ischemic ATN.  Acute metabolic encephalopathy due to uremia now resolved.  Acute metabolic acidosis with shock now resolved.  Hyponatremia. Hyperkalemia.  Patient initially required renal replacement therapy with CRRT, which was initiated on 06/22 and stopped on 06/24.  Right internal jugular HD catheter now has been removed.   His renal function has recovered, and at the time of his discharge his fluid balance is negative 2,644 ml.  Patient has lost about 3 Kg since admission.   Renal function with serum cr at 1,51 with K at 4,3 and serum bicarbonate at 24. Na 138.  Plan to resume furosemide at 40 mg po daily starting on 07/01 and have close follow up of renal function.  Can increase  to 40 mg po bid in case of volume overload, weight gain 2 to 3 lb in 24 hrs or 5 lbs in 7 days.  Stop metformin for now to prevent acidosis.   Acute on chronic diastolic heart failure (HCC) Echocardiogram with preserved LV systolic function with EF 60 to 65%, RV systolic function preserved, no LVH,  trivial pericardial effusion. No significant valvular disease.   Patient with negative fluid balance at the time of his discharge.   Plan to continue blood pressure control with amlodipine. Hold on RAAS due to recovering ATN. Resume diuresis on 07/01, with close follow up renal function and electrolytes.   Type 2 diabetes mellitus with hyperlipidemia (HCC) Glucose has been stable. Patient was treated with insulin sliding scale for glucose cover and monitoring.  Holding on metformin for now.   Continue with statin therapy.   Essential hypertension Will resume amlodipine at the time of his discharge. Continue to hold on lisinopril due to recovering from ATN.   Normocytic anemia Cell count has been stable.   Class 3 obesity (HCC) Calculated BMI is 44.4   Patient is not ambulatory, he had spine injury after a fall.   Chronic hypoxemic respiratory failure, OSA.   Pressure injury of skin {Tip this will not be part of the note when signed Body mass index is 44.49 kg/m. ,  Nutrition Documentation    Flowsheet Row ED to Hosp-Admission (Current) from 10/13/2022 in [facility] 3E HF PCU  Nutrition Problem Inadequate oral intake  Etiology poor appetite  Nutrition Goal Patient will meet greater than or equal to 90% of their needs  Interventions Glucerna shake, MVI      Active Pressure Injury/Wound(s)     Pressure Ulcer  Duration          Pressure Injury 10/13/22 Buttocks Bilateral Stage 1 -  Intact skin with non-blanchable redness of a localized area usually over a bony prominence. 4 days         BPH (benign prostatic hyperplasia) No urinary retention, continue with flomax.          Consultants: nephrology and critical care Procedures performed: right internal jugular HD catheter, no removed.   Disposition: Skilled nursing facility Diet recommendation:  Discharge Diet Orders (From admission, onward)     Start     Ordered   10/20/22 0000  Diet - low  sodium heart healthy        10/20/22 1045           Cardiac and Carb modified diet DISCHARGE MEDICATION: Allergies as of 10/20/2022   No Known Allergies      Medication List     STOP taking these medications    lisinopril 5 MG tablet Commonly known as: ZESTRIL   pregabalin 75 MG capsule Commonly known as: LYRICA       TAKE these medications    acetaminophen 500 MG tablet Commonly known as: TYLENOL Take 2 tablets (1,000 mg total) by mouth every 8 (eight) hours. What changed:  when to take this reasons to take this   albuterol 108 (90 Base) MCG/ACT inhaler Commonly known as: VENTOLIN HFA Inhale 2 puffs into the lungs every 6 (six) hours as needed. For shortness of breath What changed:  reasons to take this additional instructions   amLODipine 10 MG tablet Commonly known as: NORVASC Take 10 mg by mouth daily.   Anti-Fungal 1 % powder Generic drug: tolnaftate Apply 1 Application topically See admin instructions. Apply under the abdominal fold  every day and night shift   Artificial Tears 1.4 % ophthalmic solution Generic drug: polyvinyl alcohol Place 1 drop into both eyes every 2 (two) hours as needed for dry eyes.   aspirin EC 81 MG tablet Take 1 tablet (81 mg total) by mouth daily.   atorvastatin 20 MG tablet Commonly known as: LIPITOR Take 20 mg by mouth at bedtime.   cyanocobalamin 500 MCG tablet Commonly known as: VITAMIN B12 Take 1 tablet (500 mcg total) by mouth daily.   DESITIN EX Apply 1 application  topically See admin instructions. Apply to sacrum every shift   docusate sodium 100 MG capsule Commonly known as: COLACE Take 100 mg by mouth in the morning and at bedtime.   feeding supplement (GLUCERNA SHAKE) Liqd Take 237 mLs by mouth 3 (three) times daily between meals.   folic acid 1 MG tablet Commonly known as: FOLVITE Take 1 tablet (1 mg total) by mouth daily.   furosemide 40 MG tablet Commonly known as: LASIX Take 1 tablet (40  mg total) by mouth daily. Start taking on: October 22, 2022 What changed:  when to take this These instructions start on October 22, 2022. If you are unsure what to do until then, ask your doctor or other care provider.   gabapentin 100 MG capsule Commonly known as: NEURONTIN Take 100 mg by mouth 3 (three) times daily.   ipratropium-albuterol 0.5-2.5 (3) MG/3ML Soln Commonly known as: DUONEB Take 3 mLs by nebulization every 6 (six) hours as needed for shortness of breath or wheezing (or asthma).   metFORMIN 500 MG tablet Commonly known as: GLUCOPHAGE Take 500 mg by mouth daily with breakfast.   polyethylene glycol 17 g packet Commonly known as: MIRALAX / GLYCOLAX Take 17 g by mouth daily as needed for mild constipation.   rOPINIRole 1 MG tablet Commonly known as: REQUIP Take 1 mg by mouth every evening.   senna 8.6 MG Tabs tablet Commonly known as: SENOKOT Take 2 tablets by mouth at bedtime.   tamsulosin 0.4 MG Caps capsule Commonly known as: FLOMAX Take 1 capsule (0.4 mg total) by mouth daily.   traMADol 50 MG tablet Commonly known as: ULTRAM Take 1 tablet (50 mg total) by mouth every 12 (twelve) hours as needed for severe pain. What changed: reasons to take this   Vitamin D3 125 MCG (5000 UT) Caps Take 5,000 Units by mouth daily.        Contact information for after-discharge care     Destination     [facility] .   Service: Skilled Nursing Contact information: [address] [place] [address] [phone]                    Discharge Exam: Filed Weights   10/18/22 0400 10/19/22 0429 10/20/22 0404  Weight: (!) 148.8 kg (!) 148.8 kg (!) 145 kg   BP (!) 158/56 (BP Location: Left Arm)   Pulse 68   Temp 98.5 F (36.9 C) (Oral)   Resp 18   Ht 6' (1.829 m)   Wt (!) 145 kg   SpO2 99%   BMI 43.35 kg/m   Patient with no chest pain or dyspnea, edema has improved, no PND or orthopnea.   Neurology awake and alert ENT with  mild pallor Cardiovascular with S1 and S2 present and rhythmic with no gallops, rubs or murmurs No JVD Trace non pitting lower extremity edema Respiratory with no rales or wheezing, no rhonchi Abdomen with  no distention   Condition at discharge: stable  The results of significant diagnostics from this hospitalization (including imaging, microbiology, ancillary and laboratory) are listed below for reference.   Imaging Studies: DG CHEST PORT 1 VIEW  Result Date: 10/13/2022 CLINICAL DATA:  Central line placement. EXAM: PORTABLE CHEST 1 VIEW COMPARISON:  October 13, 2022. FINDINGS: Stable cardiomegaly. Interval placement of right internal jugular catheter with distal tip in expected position of the SVC. No pneumothorax is noted. Lungs are clear. Bony thorax is unremarkable. IMPRESSION: Interval placement of right internal jugular catheter with distal tip in expected position of the SVC. Electronically Signed   By: Lupita Raider M.D.   On: 10/13/2022 16:49   CT ABDOMEN PELVIS WO CONTRAST  Result Date: 10/13/2022 CLINICAL DATA:  Lower GI bleed. EXAM: CT ABDOMEN AND PELVIS WITHOUT CONTRAST TECHNIQUE: Multidetector CT imaging of the abdomen and pelvis was performed following the standard protocol without IV contrast. RADIATION DOSE REDUCTION: This exam was performed according to the departmental dose-optimization program which includes automated exposure control, adjustment of the mA and/or kV according to patient size and/or use of iterative reconstruction technique. COMPARISON:  08/22/2022 FINDINGS: Lower chest: Dependent atelectasis noted in the lower lungs. Hepatobiliary: No suspicious focal abnormality in the liver on this study without intravenous contrast. Gallbladder is distended. No intrahepatic or extrahepatic biliary dilation. Pancreas: No focal mass lesion. No dilatation of the main duct. No intraparenchymal cyst. No peripancreatic edema. Spleen: No splenomegaly. No suspicious focal mass lesion.  Adrenals/Urinary Tract: No adrenal nodule or mass. Kidneys unremarkable. No evidence for hydroureter. The urinary bladder appears normal for the degree of distention. Tiny left-sided bladder wall diverticulum evident. Stomach/Bowel: Stomach is unremarkable. No gastric wall thickening. No evidence of outlet obstruction. Duodenum is normally positioned as is the ligament of Treitz. No small bowel wall thickening. No small bowel dilatation. The terminal ileum is normal. The appendix is normal. No gross colonic mass. No colonic wall thickening. Diverticular changes are noted in the left colon without evidence of diverticulitis. Vascular/Lymphatic: There is moderate atherosclerotic calcification of the abdominal aorta without aneurysm. There is no gastrohepatic or hepatoduodenal ligament lymphadenopathy. No retroperitoneal or mesenteric lymphadenopathy. No pelvic sidewall lymphadenopathy. Reproductive: The prostate gland and seminal vesicles are unremarkable. Other: No intraperitoneal free fluid. Musculoskeletal: Paraumbilical ventral hernia again noted, containing only fat No worrisome lytic or sclerotic osseous abnormality. Mild superior endplate compression deformity at T12 is stable in the interval. IMPRESSION: 1. No acute findings in the abdomen or pelvis. The circumferential wall thickening and edema/inflammation associated with the rectum on the previous study have resolved in the interval. Evaluation for lower GI bleeding limited on noncontrast imaging. 2. Left colonic diverticulosis without diverticulitis. 3. Paraumbilical ventral hernia containing only fat. 4.  Aortic Atherosclerosis (ICD10-I70.0). Electronically Signed   By: Kennith Center M.D.   On: 10/13/2022 05:08   CT Head Wo Contrast  Result Date: 10/13/2022 CLINICAL DATA:  Altered mental status. EXAM: CT HEAD WITHOUT CONTRAST TECHNIQUE: Contiguous axial images were obtained from the base of the skull through the vertex without intravenous contrast.  RADIATION DOSE REDUCTION: This exam was performed according to the departmental dose-optimization program which includes automated exposure control, adjustment of the mA and/or kV according to patient size and/or use of iterative reconstruction technique. COMPARISON:  Sep 09, 2021 FINDINGS: Brain: There is mild cerebral atrophy with widening of the extra-axial spaces and ventricular dilatation. There are areas of decreased attenuation within the white matter tracts of the supratentorial brain, consistent with  microvascular disease changes. Vascular: No hyperdense vessel or unexpected calcification. Skull: Normal. Negative for fracture or focal lesion. Sinuses/Orbits: No acute finding. Other: None. IMPRESSION: 1. Generalized cerebral atrophy. 2. No acute intracranial abnormality. Electronically Signed   By: Aram Candela M.D.   On: 10/13/2022 02:48   DG Chest Portable 1 View  Result Date: 10/13/2022 CLINICAL DATA:  Shortness of breath. EXAM: PORTABLE CHEST 1 VIEW COMPARISON:  08/26/2022. FINDINGS: Heart is enlarged and the mediastinal contour is within normal limits. There is atherosclerotic calcification of the aorta. Lung volumes are low with mild atelectasis or infiltrate at the lung bases. No effusion or pneumothorax. No acute osseous abnormality. IMPRESSION: 1. Low lung volumes with mild atelectasis or infiltrate at the lung bases. 2. Cardiomegaly. Electronically Signed   By: Thornell Sartorius M.D.   On: 10/13/2022 02:19    Microbiology: Results for orders placed or performed during the hospital encounter of 10/13/22  SARS Coronavirus 2 by RT PCR (hospital order, performed in [facility] hospital lab) *cepheid single result test* Anterior Nasal Swab     Status: None   Collection Time: 10/13/22  1:22 AM   Specimen: Anterior Nasal Swab  Result Value Ref Range Status   SARS Coronavirus 2 by RT PCR NEGATIVE NEGATIVE Final    Comment: Performed at [facility], [address], [place], [place]  [address]  Culture, blood (routine x 2)     Status: Abnormal   Collection Time: 10/13/22  1:36 AM   Specimen: BLOOD  Result Value Ref Range Status   Specimen Description BLOOD LEFT ANTECUBITAL  Final   Special Requests   Final    BOTTLES DRAWN AEROBIC AND ANAEROBIC Blood Culture results may not be optimal due to an excessive volume of blood received in culture bottles   Culture  Setup Time   Final    GRAM POSITIVE COCCI IN CLUSTERS IN BOTH AEROBIC AND ANAEROBIC BOTTLES CRITICAL RESULT CALLED TO, READ BACK BY AND VERIFIED WITH: PHARMD LISA Vassie Loll [id number] AT 1813 BY EC CRITICAL RESULT CALLED TO, READ BACK BY AND VERIFIED WITH: PHARMD LISA Vassie Loll [id number] 1933 BY J RAZZAK, MT    Culture (A)  Final    STAPHYLOCOCCUS HAEMOLYTICUS THE SIGNIFICANCE OF ISOLATING THIS ORGANISM FROM A SINGLE SET OF BLOOD CULTURES WHEN MULTIPLE SETS ARE DRAWN IS UNCERTAIN. PLEASE NOTIFY THE MICROBIOLOGY DEPARTMENT WITHIN ONE WEEK IF SPECIATION AND SENSITIVITIES ARE REQUIRED. Performed at [facility], [address], [place], [place] [address]    Report Status 10/15/2022 FINAL  Final  Blood Culture ID Panel (Reflexed)     Status: Abnormal   Collection Time: 10/13/22  1:36 AM  Result Value Ref Range Status   Enterococcus faecalis NOT DETECTED NOT DETECTED Final   Enterococcus Faecium NOT DETECTED NOT DETECTED Final   Listeria monocytogenes NOT DETECTED NOT DETECTED Final   Staphylococcus species DETECTED (A) NOT DETECTED Final    Comment: CRITICAL RESULT CALLED TO, READ BACK BY AND VERIFIED WITH: PHARMD Leota Sauers [id number] 1933 BY J RAZZAK,MT    Staphylococcus aureus (BCID) NOT DETECTED NOT DETECTED Final   Staphylococcus epidermidis DETECTED (A) NOT DETECTED Final    Comment: Methicillin (oxacillin) resistant coagulase negative staphylococcus. Possible blood culture contaminant (unless isolated from more than one blood culture draw or clinical case suggests pathogenicity). No antibiotic treatment is  indicated for blood  culture contaminants. CRITICAL RESULT CALLED TO, READ BACK BY AND VERIFIED WITH: PHARMD Leota Sauers [id number] 1933 BY Berline Chough, MT  Staphylococcus lugdunensis NOT DETECTED NOT DETECTED Final   Streptococcus species NOT DETECTED NOT DETECTED Final   Streptococcus agalactiae NOT DETECTED NOT DETECTED Final   Streptococcus pneumoniae NOT DETECTED NOT DETECTED Final   Streptococcus pyogenes NOT DETECTED NOT DETECTED Final   A.calcoaceticus-baumannii NOT DETECTED NOT DETECTED Final   Bacteroides fragilis NOT DETECTED NOT DETECTED Final   Enterobacterales NOT DETECTED NOT DETECTED Final   Enterobacter cloacae complex NOT DETECTED NOT DETECTED Final   Escherichia coli NOT DETECTED NOT DETECTED Final   Klebsiella aerogenes NOT DETECTED NOT DETECTED Final   Klebsiella oxytoca NOT DETECTED NOT DETECTED Final   Klebsiella pneumoniae NOT DETECTED NOT DETECTED Final   Proteus species NOT DETECTED NOT DETECTED Final   Salmonella species NOT DETECTED NOT DETECTED Final   Serratia marcescens NOT DETECTED NOT DETECTED Final   Haemophilus influenzae NOT DETECTED NOT DETECTED Final   Neisseria meningitidis NOT DETECTED NOT DETECTED Final   Pseudomonas aeruginosa NOT DETECTED NOT DETECTED Final   Stenotrophomonas maltophilia NOT DETECTED NOT DETECTED Final   Candida albicans NOT DETECTED NOT DETECTED Final   Candida auris NOT DETECTED NOT DETECTED Final   Candida glabrata NOT DETECTED NOT DETECTED Final   Candida krusei NOT DETECTED NOT DETECTED Final   Candida parapsilosis NOT DETECTED NOT DETECTED Final   Candida tropicalis NOT DETECTED NOT DETECTED Final   Cryptococcus neoformans/gattii NOT DETECTED NOT DETECTED Final   Methicillin resistance mecA/C DETECTED (A) NOT DETECTED Final    Comment: CRITICAL RESULT CALLED TO, READ BACK BY AND VERIFIED WITH: PHARMD Leota Sauers [id number] 1931 BY J RAZZAK,MT Performed at [facility], [address] [place], [place] [address]    Culture, blood (routine x 2)     Status: None   Collection Time: 10/13/22  1:53 AM   Specimen: BLOOD RIGHT HAND  Result Value Ref Range Status   Specimen Description BLOOD RIGHT HAND  Final   Special Requests   Final    BOTTLES DRAWN AEROBIC ONLY Blood Culture adequate volume   Culture   Final    NO GROWTH 5 DAYS Performed at [facility], [address] [place], [place] [address]    Report Status 10/18/2022 FINAL  Final  MRSA Next Gen by PCR, Nasal     Status: Abnormal   Collection Time: 10/13/22  7:05 PM   Specimen: Nasal Mucosa; Nasal Swab  Result Value Ref Range Status   MRSA by PCR Next Gen DETECTED (A) NOT DETECTED Final    Comment: RESULT CALLED TO, READ BACK BY AND VERIFIED WITH: SARINE,R RN AT 0104 10/14/2022 MITCHELL,L (NOTE) The GeneXpert MRSA Assay (FDA approved for NASAL specimens only), is one component of a comprehensive MRSA colonization surveillance program. It is not intended to diagnose MRSA infection nor to guide or monitor treatment for MRSA infections. Test performance is not FDA approved in patients less than 80 years old. Performed at [facility], [address], [place], [place] [address]   Culture, blood (Routine X 2) w Reflex to ID Panel     Status: None   Collection Time: 10/14/22 10:32 AM   Specimen: BLOOD LEFT HAND  Result Value Ref Range Status   Specimen Description BLOOD LEFT HAND  Final   Special Requests   Final    BOTTLES DRAWN AEROBIC ONLY Blood Culture adequate volume   Culture   Final    NO GROWTH 5 DAYS Performed at [facility], [address] [place], [place] [address]  Report Status 10/19/2022 FINAL  Final  Culture, blood (Routine X 2) w Reflex to ID Panel     Status: None   Collection Time: 10/14/22 10:42 AM   Specimen: BLOOD RIGHT HAND  Result Value Ref Range Status   Specimen Description BLOOD RIGHT HAND  Final   Special Requests   Final    BOTTLES DRAWN AEROBIC AND ANAEROBIC Blood Culture adequate  volume   Culture   Final    NO GROWTH 5 DAYS Performed at [facility], [address], [place], [place] [address]    Report Status 10/19/2022 FINAL  Final    Labs: CBC: Recent Labs  Lab 10/13/22 1128 10/13/22 1138 10/14/22 0346  WBC  --   --  4.8  HGB 8.2* 8.1* 7.7*  HCT 24.0* 25.8* 23.9*  MCV  --   --  90.5  PLT  --   --  194   Basic Metabolic Panel: Recent Labs  Lab 10/14/22 0346 10/14/22 1528 10/15/22 0428 10/15/22 1611 10/16/22 0353 10/17/22 0057 10/18/22 0104 10/19/22 0035  NA 139   < > 136 135 134* 134* 138 138  K 5.1   < > 4.5 4.5 4.3 4.3 4.4 4.3  CL 109   < > 104 104 103 103 108 107  CO2 20*   < > 23 24 23 23 22 24   GLUCOSE 126*   < > 103* 102* 94 94 92 80  BUN 102*   < > 33* 32* 33* 30* 27* 19  CREATININE 2.84*   < > 1.40* 1.91* 2.08* 1.95* 1.73* 1.51*  CALCIUM 7.6*   < > 7.8* 8.1* 8.4* 8.2* 8.3* 8.5*  MG 1.9  --  2.1  --  2.0 1.8 1.8  --   PHOS 4.3   < > 2.4* 2.4* 2.8 3.0 3.3 2.5   < > = values in this interval not displayed.   Liver Function Tests: Recent Labs  Lab 10/15/22 1611 10/16/22 0353 10/17/22 0057 10/18/22 0104 10/19/22 0035  ALBUMIN 2.8* 2.8* 2.7* 2.6* 2.7*   CBG: Recent Labs  Lab 10/19/22 0804 10/19/22 1559 10/19/22 2008 10/20/22 0022 10/20/22 0740  GLUCAP 71 67* 73 80 71    Discharge time spent: greater than 30 minutes.  Signed: Coralie Keens, MD [facility] 10/20/2022

## 2022-10-20 NOTE — TOC Transition Note (Addendum)
Transition of Care [facility]) - CM/SW Discharge Note   Patient Details  Name: Brian Silva MRN: [id number] Date of Birth: 28-May-1940  Transition of Care [facility]) CM/SW Contact:  Donnalee Curry, LCSWA Phone Number: 10/20/2022, 9:42 AM   Clinical Narrative:     Per MD, pt medically ready for d/c.   SW spoke with Colin Mulders [facility] [phone]) confirmed able to accept back.   Call Report: [phone]  PTAR called   Final next level of care: Skilled Nursing Facility Barriers to Discharge: Barriers Resolved   Patient Goals and CMS Choice CMS Medicare.gov Compare Post Acute Care list provided to:: Patient Choice offered to / list presented to : Patient  Discharge Placement                Patient chooses bed at: [facility] Patient to be transferred to facility by: PTAR   Patient and family notified of of transfer: 10/20/22  Discharge Plan and Services Additional resources added to the After Visit Summary for   In-house Referral: Clinical Social Work Discharge Planning Services: CM Consult Post Acute Care Choice: Skilled Nursing Facility                               Social Determinants of Health (SDOH) Interventions SDOH Screenings   Food Insecurity: No Food Insecurity (10/15/2022)  Housing: Low Risk  (10/15/2022)  Transportation Needs: No Transportation Needs (10/15/2022)  Utilities: Not At Risk (10/15/2022)  Financial Resource Strain: Low Risk  (01/29/2019)  Physical Activity: Unknown (01/29/2019)  Social Connections: Unknown (01/29/2019)  Stress: No Stress Concern Present (01/29/2019)  Tobacco Use: Low Risk  (10/13/2022)     Readmission Risk Interventions     No data to display

## 2022-10-20 NOTE — NC FL2 (Signed)
[place] MEDICAID FL2 LEVEL OF CARE FORM     IDENTIFICATION  Patient Name: Brian Silva Birthdate: [birth year] Sex: male Admission Date (Current Location): 10/13/2022  [facility] and [place] Number:  Producer, television/film/video and Address:  [facility], [address], [place], [place] [address]      Provider Number: [id number]  Attending Physician Name and Address:  Coralie Keens,*  Relative Name and Phone Number:  Elita Quick [phone]    Current Level of Care: Hospital Recommended Level of Care: Skilled Nursing Facility Prior Approval Number:    Date Approved/Denied:   PASRR Number: [id number]  Discharge Plan: SNF    Current Diagnoses: Patient Active Problem List   Diagnosis Date Noted   Pressure injury of skin 10/17/2022   Transient hypotension 10/13/2022   Normocytic anemia 10/13/2022   BPH (benign prostatic hyperplasia) 10/13/2022   Acute on chronic diastolic heart failure (HCC) 10/13/2022   Chronic respiratory failure with hypoxia (HCC) 10/13/2022   Acute renal failure (ARF) (HCC) 10/13/2022   Benign neoplasm of transverse colon 08/29/2022   Benign neoplasm of descending colon 08/29/2022   Benign neoplasm of sigmoid colon 08/29/2022   Abnormal CT scan, colon 08/28/2022   ABLA (acute blood loss anemia) 08/26/2022   Heme positive stool 08/26/2022   Proctocolitis 08/25/2022   Encephalopathy acute 08/25/2022   Sepsis with encephalopathy and septic shock (HCC) 08/25/2022   Gastrointestinal hemorrhage 08/25/2022   Acute on chronic respiratory failure with hypoxia and hypercapnia (HCC) 08/24/2022   Acute metabolic encephalopathy 08/24/2022   Septic shock (HCC) 08/22/2022   Weakness 09/10/2021   Cervical radiculopathy 09/10/2021   Community acquired pneumonia 09/09/2021   Bacteremia    Acute renal failure superimposed on stage 2 chronic kidney disease (HCC) 07/06/2020   Metabolic acidosis with increased anion gap and reduced excretion  of inorganic acids 07/06/2020   Uremia 07/06/2020   Hyperkalemia 07/06/2020   Chronic diastolic CHF (congestive heart failure) (HCC) 07/06/2020   Pneumonia of both lower lobes due to infectious organism 12/23/2019   CAP (community acquired pneumonia) 12/22/2019   Compression fracture of body of thoracic vertebra (HCC) 09/10/2019   Fall at home, initial encounter 09/10/2019   Acute kidney injury superimposed on chronic kidney disease (HCC) 01/17/2018   Syncope, vasovagal 01/17/2018   Type 2 diabetes mellitus with hyperlipidemia (HCC) 01/17/2018   Hyponatremia 01/17/2018   Syncope 01/17/2018   Depression 10/25/2015   Hyperlipidemia 10/25/2015   TINEA PEDIS 01/04/2010   TINEA VERSICOLOR 01/04/2010   URI 01/04/2010   HYPERGLYCEMIA 12/02/2008   ABDOMINAL WALL HERNIA 12/01/2007   Class 3 obesity (HCC) 04/04/2007   SLEEP APNEA, OBSTRUCTIVE 04/04/2007   LOW BACK PAIN, CHRONIC 12/07/2004   HYPERLIPIDEMIA 08/12/2002   ALLERGIC RHINITIS, SEASONAL 08/12/2002   Essential hypertension 04/01/2000   Asthma 10/11/1997   SUPERFICIAL PHLEBITIS 05/06/1990    Orientation RESPIRATION BLADDER Height & Weight     Self, Time, Situation, Place  Normal External catheter Weight: (!) 319 lb 10.7 oz (145 kg) Height:  6' (182.9 cm)  BEHAVIORAL SYMPTOMS/MOOD NEUROLOGICAL BOWEL NUTRITION STATUS      Incontinent Diet (see discharge summary)  AMBULATORY STATUS COMMUNICATION OF NEEDS Skin   Total Care Verbally Other (Comment) (bilateral sacral stage 1 wounds)                       Personal Care Assistance Level of Assistance  Total care       Total Care Assistance: Maximum assistance  Functional Limitations Info             SPECIAL CARE FACTORS FREQUENCY  PT (By licensed PT), OT (By licensed OT)     PT Frequency: per facility OT Frequency: per facility            Contractures      Additional Factors Info  Code Status Code Status Info: FULL             Current Medications  (10/20/2022):  This is the current hospital active medication list Current Facility-Administered Medications  Medication Dose Route Frequency Provider Last Rate Last Admin   0.9 %  sodium chloride infusion  250 mL Intravenous Continuous Cheri Fowler, MD   Stopped at 10/15/22 1634   acetaminophen (TYLENOL) tablet 650 mg  650 mg Oral Q6H PRN Clydie Braun, MD       Or   acetaminophen (TYLENOL) suppository 650 mg  650 mg Rectal Q6H PRN Madelyn Flavors A, MD       alum & mag hydroxide-simeth (MAALOX/MYLANTA) 200-200-20 MG/5ML suspension 30 mL  30 mL Oral Q4H PRN Arrien, York Ram, MD       atorvastatin (LIPITOR) tablet 20 mg  20 mg Oral QHS Smith, Rondell A, MD   20 mg at 10/19/22 2141   Chlorhexidine Gluconate Cloth 2 % PADS 6 each  6 each Topical Q0600 Maxie Barb, MD   6 each at 10/18/22 1610   cyanocobalamin (VITAMIN B12) tablet 500 mcg  500 mcg Oral Daily Katrinka Blazing, Rondell A, MD   500 mcg at 10/19/22 1103   dextrose 50 % solution 50 mL  1 ampule Intravenous PRN Madelyn Flavors A, MD       enoxaparin (LOVENOX) injection 40 mg  40 mg Subcutaneous Q24H Arrien, York Ram, MD   40 mg at 10/19/22 1103   feeding supplement (GLUCERNA SHAKE) (GLUCERNA SHAKE) liquid 237 mL  237 mL Oral TID BM Cheri Fowler, MD   237 mL at 10/19/22 1103   folic acid (FOLVITE) tablet 1 mg  1 mg Oral Daily Smith, Rondell A, MD   1 mg at 10/19/22 1103   insulin aspart (novoLOG) injection 0-15 Units  0-15 Units Subcutaneous Q4H Chand, Sudham, MD       ipratropium-albuterol (DUONEB) 0.5-2.5 (3) MG/3ML nebulizer solution 3 mL  3 mL Nebulization Q6H PRN Madelyn Flavors A, MD       multivitamin (RENA-VIT) tablet 1 tablet  1 tablet Oral QHS Cheri Fowler, MD   1 tablet at 10/19/22 2141   ondansetron (ZOFRAN) injection 4 mg  4 mg Intravenous Q8H PRN Cheri Fowler, MD   4 mg at 10/14/22 1942   Oral care mouth rinse  15 mL Mouth Rinse 4 times per day Cheri Fowler, MD   15 mL at 10/19/22 0800   Oral care mouth rinse   15 mL Mouth Rinse PRN Cheri Fowler, MD       rOPINIRole (REQUIP) tablet 1 mg  1 mg Oral QPM Smith, Rondell A, MD   1 mg at 10/19/22 1824   sodium chloride flush (NS) 0.9 % injection 10-40 mL  10-40 mL Intracatheter Q12H Chand, Garnet Sierras, MD   10 mL at 10/19/22 1104   sodium chloride flush (NS) 0.9 % injection 10-40 mL  10-40 mL Intracatheter PRN Cheri Fowler, MD       sodium chloride flush (NS) 0.9 % injection 3 mL  3 mL Intravenous Q12H Smith, Rondell A, MD   3 mL at 10/19/22 2142  tamsulosin (FLOMAX) capsule 0.4 mg  0.4 mg Oral Daily Katrinka Blazing, Rondell A, MD   0.4 mg at 10/19/22 1102     Discharge Medications: Please see discharge summary for a list of discharge medications.  Relevant Imaging Results:  Relevant Lab Results:   Additional Information SSN [id number]  Marya Landry Jorell Agne, LCSWA

## 2022-10-22 DIAGNOSIS — N179 Acute kidney failure, unspecified: Secondary | ICD-10-CM | POA: Diagnosis not present

## 2022-10-22 DIAGNOSIS — I5032 Chronic diastolic (congestive) heart failure: Secondary | ICD-10-CM | POA: Diagnosis not present

## 2022-10-22 DIAGNOSIS — N182 Chronic kidney disease, stage 2 (mild): Secondary | ICD-10-CM | POA: Diagnosis not present

## 2022-10-22 LAB — GLUCOSE, CAPILLARY: Glucose-Capillary: 76 mg/dL (ref 70–99)

## 2022-11-01 DIAGNOSIS — R140 Abdominal distension (gaseous): Secondary | ICD-10-CM | POA: Diagnosis not present

## 2022-11-21 DIAGNOSIS — J9620 Acute and chronic respiratory failure, unspecified whether with hypoxia or hypercapnia: Secondary | ICD-10-CM | POA: Diagnosis not present

## 2022-11-21 DIAGNOSIS — F419 Anxiety disorder, unspecified: Secondary | ICD-10-CM | POA: Diagnosis not present

## 2022-11-21 DIAGNOSIS — J45909 Unspecified asthma, uncomplicated: Secondary | ICD-10-CM | POA: Diagnosis not present

## 2022-11-21 DIAGNOSIS — E119 Type 2 diabetes mellitus without complications: Secondary | ICD-10-CM | POA: Diagnosis not present

## 2022-11-28 DIAGNOSIS — I10 Essential (primary) hypertension: Secondary | ICD-10-CM | POA: Diagnosis not present

## 2022-11-28 DIAGNOSIS — J9622 Acute and chronic respiratory failure with hypercapnia: Secondary | ICD-10-CM | POA: Diagnosis not present

## 2022-11-30 DIAGNOSIS — R972 Elevated prostate specific antigen [PSA]: Secondary | ICD-10-CM | POA: Diagnosis not present

## 2022-11-30 DIAGNOSIS — I509 Heart failure, unspecified: Secondary | ICD-10-CM | POA: Diagnosis not present

## 2022-11-30 DIAGNOSIS — I10 Essential (primary) hypertension: Secondary | ICD-10-CM | POA: Diagnosis not present

## 2022-11-30 DIAGNOSIS — N390 Urinary tract infection, site not specified: Secondary | ICD-10-CM | POA: Diagnosis not present

## 2022-12-03 DIAGNOSIS — D649 Anemia, unspecified: Secondary | ICD-10-CM | POA: Diagnosis not present

## 2022-12-18 DIAGNOSIS — E1142 Type 2 diabetes mellitus with diabetic polyneuropathy: Secondary | ICD-10-CM | POA: Diagnosis not present

## 2022-12-18 DIAGNOSIS — I5032 Chronic diastolic (congestive) heart failure: Secondary | ICD-10-CM | POA: Diagnosis not present

## 2022-12-18 DIAGNOSIS — M4722 Other spondylosis with radiculopathy, cervical region: Secondary | ICD-10-CM | POA: Diagnosis not present

## 2022-12-26 DIAGNOSIS — Z79899 Other long term (current) drug therapy: Secondary | ICD-10-CM | POA: Diagnosis not present

## 2022-12-26 DIAGNOSIS — D649 Anemia, unspecified: Secondary | ICD-10-CM | POA: Diagnosis not present

## 2022-12-26 DIAGNOSIS — E559 Vitamin D deficiency, unspecified: Secondary | ICD-10-CM | POA: Diagnosis not present

## 2022-12-27 DIAGNOSIS — E118 Type 2 diabetes mellitus with unspecified complications: Secondary | ICD-10-CM | POA: Diagnosis not present

## 2022-12-27 DIAGNOSIS — D649 Anemia, unspecified: Secondary | ICD-10-CM | POA: Diagnosis not present

## 2022-12-31 DIAGNOSIS — E559 Vitamin D deficiency, unspecified: Secondary | ICD-10-CM | POA: Diagnosis not present

## 2023-01-09 DIAGNOSIS — G629 Polyneuropathy, unspecified: Secondary | ICD-10-CM | POA: Diagnosis not present

## 2023-01-09 DIAGNOSIS — R609 Edema, unspecified: Secondary | ICD-10-CM | POA: Diagnosis not present

## 2023-01-09 DIAGNOSIS — R52 Pain, unspecified: Secondary | ICD-10-CM | POA: Diagnosis not present

## 2023-01-09 DIAGNOSIS — G2581 Restless legs syndrome: Secondary | ICD-10-CM | POA: Diagnosis not present

## 2023-01-31 DIAGNOSIS — F411 Generalized anxiety disorder: Secondary | ICD-10-CM | POA: Diagnosis not present

## 2023-02-07 DIAGNOSIS — I10 Essential (primary) hypertension: Secondary | ICD-10-CM | POA: Diagnosis not present

## 2023-02-14 DIAGNOSIS — M4722 Other spondylosis with radiculopathy, cervical region: Secondary | ICD-10-CM | POA: Diagnosis not present

## 2023-02-14 DIAGNOSIS — I5032 Chronic diastolic (congestive) heart failure: Secondary | ICD-10-CM | POA: Diagnosis not present

## 2023-02-14 DIAGNOSIS — I10 Essential (primary) hypertension: Secondary | ICD-10-CM | POA: Diagnosis not present

## 2023-02-14 DIAGNOSIS — E118 Type 2 diabetes mellitus with unspecified complications: Secondary | ICD-10-CM | POA: Diagnosis not present

## 2023-03-12 DIAGNOSIS — I10 Essential (primary) hypertension: Secondary | ICD-10-CM | POA: Diagnosis not present

## 2023-03-13 DIAGNOSIS — L84 Corns and callosities: Secondary | ICD-10-CM | POA: Diagnosis not present

## 2023-03-13 DIAGNOSIS — L603 Nail dystrophy: Secondary | ICD-10-CM | POA: Diagnosis not present

## 2023-03-13 DIAGNOSIS — R0981 Nasal congestion: Secondary | ICD-10-CM | POA: Diagnosis not present

## 2023-03-13 DIAGNOSIS — L602 Onychogryphosis: Secondary | ICD-10-CM | POA: Diagnosis not present

## 2023-03-13 DIAGNOSIS — J309 Allergic rhinitis, unspecified: Secondary | ICD-10-CM | POA: Diagnosis not present

## 2023-03-13 DIAGNOSIS — F419 Anxiety disorder, unspecified: Secondary | ICD-10-CM | POA: Diagnosis not present

## 2023-03-13 DIAGNOSIS — J9610 Chronic respiratory failure, unspecified whether with hypoxia or hypercapnia: Secondary | ICD-10-CM | POA: Diagnosis not present

## 2023-03-13 DIAGNOSIS — E1151 Type 2 diabetes mellitus with diabetic peripheral angiopathy without gangrene: Secondary | ICD-10-CM | POA: Diagnosis not present

## 2023-03-13 DIAGNOSIS — Z794 Long term (current) use of insulin: Secondary | ICD-10-CM | POA: Diagnosis not present

## 2023-03-25 DIAGNOSIS — J4530 Mild persistent asthma, uncomplicated: Secondary | ICD-10-CM | POA: Diagnosis not present

## 2023-03-27 DIAGNOSIS — E559 Vitamin D deficiency, unspecified: Secondary | ICD-10-CM | POA: Diagnosis not present

## 2023-03-27 DIAGNOSIS — I509 Heart failure, unspecified: Secondary | ICD-10-CM | POA: Diagnosis not present

## 2023-03-27 DIAGNOSIS — Z79899 Other long term (current) drug therapy: Secondary | ICD-10-CM | POA: Diagnosis not present

## 2023-04-01 DIAGNOSIS — N1832 Chronic kidney disease, stage 3b: Secondary | ICD-10-CM | POA: Diagnosis not present

## 2023-04-15 DIAGNOSIS — D649 Anemia, unspecified: Secondary | ICD-10-CM | POA: Diagnosis not present

## 2023-04-15 DIAGNOSIS — I5032 Chronic diastolic (congestive) heart failure: Secondary | ICD-10-CM | POA: Diagnosis not present

## 2023-04-15 DIAGNOSIS — I10 Essential (primary) hypertension: Secondary | ICD-10-CM | POA: Diagnosis not present

## 2023-04-15 DIAGNOSIS — M4722 Other spondylosis with radiculopathy, cervical region: Secondary | ICD-10-CM | POA: Diagnosis not present

## 2023-04-23 DIAGNOSIS — F419 Anxiety disorder, unspecified: Secondary | ICD-10-CM | POA: Diagnosis not present

## 2023-04-23 DIAGNOSIS — Z91198 Patient's noncompliance with other medical treatment and regimen for other reason: Secondary | ICD-10-CM | POA: Diagnosis not present

## 2023-10-23 ENCOUNTER — Emergency Department (HOSPITAL_COMMUNITY)

## 2023-10-23 ENCOUNTER — Emergency Department (HOSPITAL_COMMUNITY)
Admission: EM | Admit: 2023-10-23 | Discharge: 2023-10-24 | Disposition: A | Discharge status: Skilled Nursing Facility | Attending: Emergency Medicine | Admitting: Emergency Medicine

## 2023-10-23 DIAGNOSIS — R051 Acute cough: Secondary | ICD-10-CM | POA: Diagnosis not present

## 2023-10-23 DIAGNOSIS — Z7982 Long term (current) use of aspirin: Secondary | ICD-10-CM | POA: Insufficient documentation

## 2023-10-23 DIAGNOSIS — J029 Acute pharyngitis, unspecified: Secondary | ICD-10-CM | POA: Diagnosis present

## 2023-10-23 DIAGNOSIS — R0602 Shortness of breath: Secondary | ICD-10-CM | POA: Diagnosis not present

## 2023-10-23 LAB — RESP PANEL BY RT-PCR (RSV, FLU A&B, COVID)  RVPGX2
Influenza A by PCR: NEGATIVE
Influenza B by PCR: NEGATIVE
Resp Syncytial Virus by PCR: NEGATIVE
SARS Coronavirus 2 by RT PCR: NEGATIVE

## 2023-10-23 LAB — GROUP A STREP BY PCR: Group A Strep by PCR: NOT DETECTED

## 2023-10-23 LAB — BRAIN NATRIURETIC PEPTIDE: B Natriuretic Peptide: 89 pg/mL (ref 0.0–100.0)

## 2023-10-23 LAB — TROPONIN I (HIGH SENSITIVITY): Troponin I (High Sensitivity): 38 ng/L — ABNORMAL HIGH (ref <18)

## 2023-10-23 MED ORDER — BENZOCAINE 20 % MT AERO
INHALATION_SPRAY | Freq: Once | OROMUCOSAL | Status: AC
  Filled 2023-10-23: qty 57

## 2023-10-23 NOTE — ED Triage Notes (Signed)
 BIB [facility] From [facility] for coughing & Sore throat for 3 days. Causing shortness of breath, taking an inhaler. Feels like something is stuck in my throat Requesting neb treatment and oxygen. 4L .  160/78 170 CBG 110HR 97% on RA

## 2023-10-23 NOTE — ED Provider Notes (Signed)
 [place] EMERGENCY DEPARTMENT AT [facility] Provider Note   CSN: [id number] Arrival date & time: 10/23/23  8077     Patient presents with: Sore Throat   Brian Silva is a 83 y.o. male   Patient resides at [facility]  Here for evaluation of cough, shortness of breath and sore throat.  Started 3 days ago.  He has been using his inhaler at home without relief.  Requesting nebulizer treatment and oxygen via nasal cannula with EMS.  He states his throat hurts.  He has been able to eat and drink at home however feels like there is something on the left.  No headache, neck stiffness.  He feels like his throat is hoarse.  No overt chest pain.  He does not ambulate at baseline.  He does get some shortness of breath when he lays completely flat however this is not new.  He denies any prior history of heart failure.  No prior history of PE or DVT.  Unknown sick contacts.   HPI     Prior to Admission medications   Medication Sig Start Date End Date Taking? Authorizing Provider  acetaminophen  (TYLENOL ) 500 MG tablet Take 2 tablets (1,000 mg total) by mouth every 8 (eight) hours. Patient taking differently: Take 1,000 mg by mouth every 8 (eight) hours as needed for mild pain. 09/14/19   Samtani, Jai-Gurmukh, MD  albuterol  (PROVENTIL  HFA;VENTOLIN  HFA) 108 (90 Base) MCG/ACT inhaler Inhale 2 puffs into the lungs every 6 (six) hours as needed. For shortness of breath Patient taking differently: Inhale 2 puffs into the lungs every 6 (six) hours as needed (for asthma). 10/28/15   Billy Junnie HERO, MD  amLODipine  (NORVASC ) 10 MG tablet Take 10 mg by mouth daily.    [provider]  ANTI-FUNGAL 1 % powder Apply 1 Application topically See admin instructions. Apply under the abdominal fold every day and night shift    [provider]  ARTIFICIAL TEARS 1.4 % ophthalmic solution Place 1 drop into both eyes every 2 (two) hours as needed for dry eyes.    [provider]   aspirin  EC 81 MG tablet Take 1 tablet (81 mg total) by mouth daily. 09/03/22   Singh, Prashant K, MD  atorvastatin  (LIPITOR) 20 MG tablet Take 20 mg by mouth at bedtime.    [provider]  Cholecalciferol  (VITAMIN D3) 125 MCG (5000 UT) CAPS Take 5,000 Units by mouth daily. 06/07/20   [provider]  docusate sodium  (COLACE) 100 MG capsule Take 100 mg by mouth in the morning and at bedtime.    [provider]  folic acid  (FOLVITE ) 1 MG tablet Take 1 tablet (1 mg total) by mouth daily. 07/11/20   Krishnan, Gokul, MD  furosemide  (LASIX ) 40 MG tablet Take 1 tablet (40 mg total) by mouth daily. 10/22/22   Arrien, Mauricio Daniel, MD  gabapentin  (NEURONTIN ) 100 MG capsule Take 100 mg by mouth 3 (three) times daily.    [provider]  ipratropium-albuterol  (DUONEB) 0.5-2.5 (3) MG/3ML SOLN Take 3 mLs by nebulization every 6 (six) hours as needed for shortness of breath or wheezing (or asthma). 07/04/20   [provider]  metFORMIN (GLUCOPHAGE) 500 MG tablet Take 500 mg by mouth daily with breakfast.    [provider]  polyethylene glycol (MIRALAX  / GLYCOLAX ) 17 g packet Take 17 g by mouth daily as needed for mild constipation.    [provider]  rOPINIRole  (REQUIP ) 1 MG tablet Take  1 mg by mouth every evening.    [provider]  senna (SENOKOT) 8.6 MG TABS tablet Take 2 tablets by mouth at bedtime.    [provider]  tamsulosin  (FLOMAX ) 0.4 MG CAPS capsule Take 1 capsule (0.4 mg total) by mouth daily. 08/31/22   Singh, Prashant K, MD  traMADol  (ULTRAM ) 50 MG tablet Take 1 tablet (50 mg total) by mouth every 12 (twelve) hours as needed for severe pain. 10/20/22   Arrien, Mauricio Daniel, MD  vitamin B-12 (CYANOCOBALAMIN ) 500 MCG tablet Take 1 tablet (500 mcg total) by mouth daily. 07/11/20   Krishnan, Gokul, MD  Zinc Oxide (DESITIN EX) Apply 1 application  topically See admin instructions. Apply to sacrum every shift    [provider]    Allergies: Patient has no known allergies.    Review of Systems  Constitutional:  Positive for activity change and appetite change.  HENT:  Positive for sore throat. Negative for congestion, drooling, ear discharge, facial swelling, postnasal drip, rhinorrhea, trouble swallowing and voice change.   Respiratory:  Positive for cough, chest tightness and shortness of breath. Negative for apnea, choking, wheezing and stridor.   Cardiovascular: Negative.   Gastrointestinal: Negative.   Genitourinary: Negative.   Musculoskeletal: Negative.   Skin: Negative.   Neurological: Negative.   All other systems reviewed and are negative.  Updated Vital Signs BP 119/69 (BP Location: Right Arm)   Pulse 98   Temp 98.2 F (36.8 C)   Resp 20   SpO2 97%   Physical Exam Vitals and nursing note reviewed.  Constitutional:      General: He is not in acute distress.    Appearance: He is well-developed. He is obese. He is not ill-appearing, toxic-appearing or diaphoretic.  HENT:     Head: Normocephalic and atraumatic.     Nose: No congestion or rhinorrhea.     Mouth/Throat:     Comments: PO erythematous.  Difficult exam due to body habitus. White scrabble coating to tongue. Cannot visualize tonsils BIL. Mallampati 3/4 Eyes:     Pupils: Pupils are equal, round, and reactive to light.  Cardiovascular:     Rate and Rhythm: Normal rate and regular rhythm.  Pulmonary:     Effort: Pulmonary effort is normal. No respiratory distress.     Breath sounds: Normal breath sounds.     Comments: Difficult exam due to body habitus. No obvious wheeze. Abdominal:     General: Bowel sounds are normal. There is no distension.     Palpations: Abdomen is soft.     Tenderness: There is no abdominal tenderness. There is no rebound.     Hernia: No hernia is present.  Musculoskeletal:        General: Normal range of motion.     Cervical back: Normal range of motion and neck supple.  Skin:     General: Skin is warm and dry.     Capillary Refill: Capillary refill takes less than 2 seconds.     Comments: Dried, flaking skin to BLE  Neurological:     General: No focal deficit present.     Mental Status: He is alert and oriented to person, place, and time.     (all labs ordered are listed, but only abnormal results are displayed) Labs Reviewed  TROPONIN I (HIGH SENSITIVITY) - Abnormal; Notable for the following components:      Result Value   Troponin I (High Sensitivity) 38 (*)    All other components  within normal limits  RESP PANEL BY RT-PCR (RSV, FLU A&B, COVID)  [id number]  GROUP A STREP BY PCR  BRAIN NATRIURETIC PEPTIDE  CBC WITH DIFFERENTIAL/PLATELET  CBC WITH DIFFERENTIAL/PLATELET  BASIC METABOLIC PANEL WITH GFR  TROPONIN I (HIGH SENSITIVITY)    EKG: None  Radiology: DG Chest 2 View Result Date: 10/23/2023 CLINICAL DATA:  sob EXAM: CHEST - 2 VIEW COMPARISON:  Chest x-ray 10/13/2022 FINDINGS: The heart and mediastinal contours are grossly unchanged. Low lung volumes. Bilateral lung bases not well visualized due to overlying soft tissues and patient positioning. Possible basilar consolidation on the left. No pulmonary edema. Possible pleural effusion. No pneumothorax. No acute osseous abnormality. IMPRESSION: Low lung volumes with bilateral bases not well visualized. Recommend repeat chest x-ray PA and lateral view with improved inspiratory effort and patient positioning for further evaluation. Electronically Signed   By: Morgane  Naveau M.D.   On: 10/23/2023 20:11     Procedures   Medications Ordered in the ED  Benzocaine (HURRCAINE) 20 % mouth spray ( Mouth/Throat Given 10/23/23 8054)    83 year old here for evaluation of feeling unwell.  Sounds like 3 days of cough, shortness of breath and sore throat.  He feels like there is something in his throat that he cannot cough up.  He has been able to eat and drink at home without difficulty.  He was requesting EMS placed  him on nasal cannula and giving him nebulizer treatments has been using his puffer at home without relief.  Sounds like he has some lower extremity edema at baseline, no history of PE or DVT.  He appears chronically deconditioned, nonmobile.  He has hoarse voice.  Difficult p.o. exam due to body habitus, does appear to have some erythema however cannot visualize tonsils.  Will plan on labs and imaging  Labs and imaging personally viewed and interpreted:  Chest x-ray poor views and inspiratory effort recommend repeat BNP 89 Trop 38 Strep negative COVID, flu, RSV and  Unfortunately earlier labs clotted.  Care transferred to oncoming provider who will FU on labs, imaging and dispo                                   Medical Decision Making Amount and/or Complexity of Data Reviewed External Data Reviewed: labs, radiology, ECG and notes. Labs: ordered. Decision-making details documented in ED Course. Radiology: ordered and independent interpretation performed. Decision-making details documented in ED Course. ECG/medicine tests: ordered and independent interpretation performed. Decision-making details documented in ED Course.  Risk OTC drugs. Prescription drug management. Decision regarding hospitalization. Diagnosis or treatment significantly limited by social determinants of health.       Final diagnoses:  Sore throat  Acute cough    ED Discharge Orders     None          Jaquan Sadowsky A, PA-C 10/23/23 2329    Laurice Maude BROCKS, MD 10/24/23 0028

## 2023-10-23 NOTE — ED Provider Notes (Signed)
  Physical Exam  BP 119/69 (BP Location: Right Arm)   Pulse 98   Temp 98.2 F (36.8 C)   Resp 20   SpO2 97%   Physical Exam  Procedures  Procedures  ED Course / MDM    Medical Decision Making Amount and/or Complexity of Data Reviewed Labs: ordered. Radiology: ordered.  Risk OTC drugs.   Sore throat x 3 days, tolerating secretions. If scans normal and trop is flat suspect d/c

## 2023-10-23 NOTE — ED Notes (Signed)
 Patient transported to CT

## 2023-10-24 DIAGNOSIS — J029 Acute pharyngitis, unspecified: Secondary | ICD-10-CM | POA: Diagnosis not present

## 2023-10-24 LAB — CBC WITH DIFFERENTIAL/PLATELET
Abs Immature Granulocytes: 0.07 10*3/uL (ref 0.00–0.07)
Basophils Absolute: 0.1 10*3/uL (ref 0.0–0.1)
Basophils Relative: 1 %
Eosinophils Absolute: 0.1 10*3/uL (ref 0.0–0.5)
Eosinophils Relative: 1 %
HCT: 36.3 % — ABNORMAL LOW (ref 39.0–52.0)
Hemoglobin: 11.8 g/dL — ABNORMAL LOW (ref 13.0–17.0)
Immature Granulocytes: 1 %
Lymphocytes Relative: 10 %
Lymphs Abs: 1.4 10*3/uL (ref 0.7–4.0)
MCH: 28.4 pg (ref 26.0–34.0)
MCHC: 32.5 g/dL (ref 30.0–36.0)
MCV: 87.5 fL (ref 80.0–100.0)
Monocytes Absolute: 1 10*3/uL (ref 0.1–1.0)
Monocytes Relative: 8 %
Neutro Abs: 10.9 10*3/uL — ABNORMAL HIGH (ref 1.7–7.7)
Neutrophils Relative %: 79 %
Platelets: 330 10*3/uL (ref 150–400)
RBC: 4.15 MIL/uL — ABNORMAL LOW (ref 4.22–5.81)
RDW: 13.4 % (ref 11.5–15.5)
WBC: 13.6 10*3/uL — ABNORMAL HIGH (ref 4.0–10.5)
nRBC: 0 % (ref 0.0–0.2)

## 2023-10-24 LAB — BASIC METABOLIC PANEL WITH GFR
Anion gap: 14 (ref 5–15)
BUN: 32 mg/dL — ABNORMAL HIGH (ref 8–23)
CO2: 24 mmol/L (ref 22–32)
Calcium: 8.8 mg/dL — ABNORMAL LOW (ref 8.9–10.3)
Chloride: 98 mmol/L (ref 98–111)
Creatinine, Ser: 1.78 mg/dL — ABNORMAL HIGH (ref 0.61–1.24)
GFR, Estimated: 38 mL/min — ABNORMAL LOW (ref >60)
Glucose, Bld: 153 mg/dL — ABNORMAL HIGH (ref 70–99)
Potassium: 4.7 mmol/L (ref 3.5–5.1)
Sodium: 136 mmol/L (ref 135–145)

## 2023-10-24 LAB — TROPONIN I (HIGH SENSITIVITY): Troponin I (High Sensitivity): 8 ng/L (ref <18)

## 2023-10-24 MED ORDER — LIDOCAINE VISCOUS HCL 2 % MT SOLN
15.0000 mL | Freq: Once | OROMUCOSAL | Status: AC
  Administered 2023-10-24: 15 mL via OROMUCOSAL
  Filled 2023-10-24: qty 15

## 2023-10-24 NOTE — Discharge Instructions (Signed)
 As discussed, your workup in the ER today was reassuring for acute findings.  Laboratory evaluation and CT imaging did not reveal any emergent cause of your symptoms.  Your symptoms are likely viral in nature.  Please use the medications provided to you in the emergency department today for management of your symptoms.  Please call your PCP to schedule close follow-up appointment.  Return if development of any new or worsening symptoms.

## 2023-10-24 NOTE — ED Notes (Signed)
 Pt in no acute distress.  Chest symmetrical; regular breathing and non-labored.

## 2023-10-24 NOTE — ED Notes (Signed)
 Pt in hall bed, continues to try to clear throat when RN near.  He has been observed drinking water w/o any issues or choking incidents.  Pt alert and oriented.  Vitals stable at this time w/o any O2 at this time.

## 2023-10-24 NOTE — ED Notes (Signed)
 Pt anxious able results, RN assured him that imaging was clear.  Pt getting himself worked up.  RN attempted to reassure pt that his O2 was good on room air.

## 2023-10-24 NOTE — ED Notes (Signed)
 Pt awaiting PTAR back to [facility] and [facility].
